# Patient Record
Sex: Male | Born: 1940
Health system: Southern US, Community
[De-identification: ages and names within clinical notes are randomized; demographics above are authoritative.]

## PROBLEM LIST (undated history)

## (undated) DIAGNOSIS — IMO0002 Reserved for concepts with insufficient information to code with codable children: Secondary | ICD-10-CM

## (undated) DIAGNOSIS — I10 Essential (primary) hypertension: Secondary | ICD-10-CM

## (undated) DIAGNOSIS — R739 Hyperglycemia, unspecified: Secondary | ICD-10-CM

## (undated) DIAGNOSIS — A6000 Herpesviral infection of urogenital system, unspecified: Secondary | ICD-10-CM

## (undated) DIAGNOSIS — C679 Malignant neoplasm of bladder, unspecified: Secondary | ICD-10-CM

## (undated) HISTORY — DX: Hyperglycemia, unspecified: R73.9

## (undated) HISTORY — PX: EYE SURGERY: SHX253

## (undated) HISTORY — DX: Reserved for concepts with insufficient information to code with codable children: IMO0002

## (undated) HISTORY — DX: Malignant neoplasm of bladder, unspecified: C67.9

## (undated) HISTORY — PX: ROTATOR CUFF REPAIR: SHX139

## (undated) HISTORY — DX: Herpesviral infection of urogenital system, unspecified: A60.00

## (undated) HISTORY — PX: TONSILLECTOMY: SUR1361

## (undated) HISTORY — PX: PROSTATECTOMY: SHX69

## (undated) HISTORY — DX: Essential (primary) hypertension: I10

---

## 2006-03-25 ENCOUNTER — Other Ambulatory Visit: Payer: Self-pay

## 2006-03-25 ENCOUNTER — Emergency Department: Payer: Self-pay | Admitting: Emergency Medicine

## 2006-03-27 ENCOUNTER — Emergency Department: Payer: Self-pay | Admitting: Emergency Medicine

## 2006-04-12 HISTORY — PX: SQUAMOUS CELL CARCINOMA EXCISION: SHX2433

## 2006-12-05 ENCOUNTER — Other Ambulatory Visit: Payer: Self-pay

## 2006-12-05 ENCOUNTER — Ambulatory Visit: Payer: Self-pay | Admitting: General Surgery

## 2006-12-08 ENCOUNTER — Ambulatory Visit: Payer: Self-pay | Admitting: General Surgery

## 2006-12-08 HISTORY — PX: HERNIA REPAIR: SHX51

## 2008-09-01 ENCOUNTER — Ambulatory Visit: Payer: Self-pay | Admitting: Internal Medicine

## 2009-01-03 DIAGNOSIS — R739 Hyperglycemia, unspecified: Secondary | ICD-10-CM | POA: Insufficient documentation

## 2009-01-03 LAB — PSA: PSA: 0.6

## 2009-05-19 ENCOUNTER — Ambulatory Visit: Payer: Self-pay | Admitting: Family Medicine

## 2009-12-11 ENCOUNTER — Ambulatory Visit: Payer: Self-pay | Admitting: Family Medicine

## 2010-07-21 ENCOUNTER — Ambulatory Visit: Payer: Self-pay | Admitting: Gastroenterology

## 2010-07-21 LAB — HM COLONOSCOPY: HM Colonoscopy: NORMAL

## 2012-07-30 ENCOUNTER — Ambulatory Visit: Payer: Self-pay | Admitting: Family Medicine

## 2012-10-10 HISTORY — PX: CATARACT EXTRACTION: SUR2

## 2012-10-25 ENCOUNTER — Ambulatory Visit: Payer: Self-pay | Admitting: Ophthalmology

## 2014-07-10 ENCOUNTER — Ambulatory Visit
Admit: 2014-07-10 | Disposition: A | Discharge status: Home / Self Care | Payer: Self-pay | Attending: Family Medicine | Admitting: Family Medicine

## 2014-07-13 ENCOUNTER — Ambulatory Visit
Admit: 2014-07-13 | Disposition: A | Discharge status: Home / Self Care | Payer: Self-pay | Attending: Internal Medicine | Admitting: Internal Medicine

## 2014-07-17 ENCOUNTER — Ambulatory Visit
Admit: 2014-07-17 | Disposition: A | Discharge status: Home / Self Care | Payer: Self-pay | Attending: Family Medicine | Admitting: Family Medicine

## 2014-07-17 LAB — CBC AND DIFFERENTIAL
HEMATOCRIT: 50 % (ref 41–53)
HEMOGLOBIN: 17.2 g/dL (ref 13.5–17.5)
Platelets: 581 10*3/uL — AB (ref 150–399)
WBC: 19 10^3/mL

## 2014-07-26 ENCOUNTER — Ambulatory Visit
Admit: 2014-07-26 | Disposition: A | Discharge status: Home / Self Care | Payer: Self-pay | Attending: Family Medicine | Admitting: Family Medicine

## 2014-07-30 ENCOUNTER — Ambulatory Visit
Admit: 2014-07-30 | Disposition: A | Discharge status: Home / Self Care | Payer: Self-pay | Attending: Family Medicine | Admitting: Family Medicine

## 2014-09-26 DIAGNOSIS — M5416 Radiculopathy, lumbar region: Secondary | ICD-10-CM | POA: Diagnosis not present

## 2014-12-03 ENCOUNTER — Other Ambulatory Visit: Payer: Self-pay | Admitting: Family Medicine

## 2014-12-10 DIAGNOSIS — R252 Cramp and spasm: Secondary | ICD-10-CM | POA: Insufficient documentation

## 2014-12-10 DIAGNOSIS — M79606 Pain in leg, unspecified: Secondary | ICD-10-CM | POA: Insufficient documentation

## 2014-12-10 DIAGNOSIS — M5410 Radiculopathy, site unspecified: Secondary | ICD-10-CM | POA: Insufficient documentation

## 2014-12-10 DIAGNOSIS — A6000 Herpesviral infection of urogenital system, unspecified: Secondary | ICD-10-CM | POA: Insufficient documentation

## 2014-12-10 DIAGNOSIS — I10 Essential (primary) hypertension: Secondary | ICD-10-CM | POA: Insufficient documentation

## 2014-12-10 DIAGNOSIS — N529 Male erectile dysfunction, unspecified: Secondary | ICD-10-CM | POA: Insufficient documentation

## 2014-12-10 DIAGNOSIS — IMO0002 Reserved for concepts with insufficient information to code with codable children: Secondary | ICD-10-CM | POA: Insufficient documentation

## 2014-12-10 DIAGNOSIS — M792 Neuralgia and neuritis, unspecified: Secondary | ICD-10-CM | POA: Insufficient documentation

## 2014-12-10 DIAGNOSIS — G589 Mononeuropathy, unspecified: Secondary | ICD-10-CM | POA: Insufficient documentation

## 2014-12-10 DIAGNOSIS — R42 Dizziness and giddiness: Secondary | ICD-10-CM | POA: Insufficient documentation

## 2014-12-12 ENCOUNTER — Ambulatory Visit (INDEPENDENT_AMBULATORY_CARE_PROVIDER_SITE_OTHER): Payer: Medicare PPO | Admitting: Family Medicine

## 2014-12-12 ENCOUNTER — Encounter: Payer: Self-pay | Admitting: Family Medicine

## 2014-12-12 VITALS — BP 122/70 | HR 82 | Temp 98.3°F | Resp 16 | Ht 70.75 in | Wt 188.0 lb

## 2014-12-12 DIAGNOSIS — Z23 Encounter for immunization: Secondary | ICD-10-CM

## 2014-12-12 DIAGNOSIS — Z0000 Encounter for general adult medical examination without abnormal findings: Secondary | ICD-10-CM

## 2014-12-12 NOTE — Progress Notes (Signed)
Patient: Jerry Mcpherson, Male    DOB: 1940-06-27, 74 y.o.   MRN: [id number] Visit Date: 12/12/2014  Today's Provider: Lelon Huh, MD   Chief Complaint  Patient presents with  . Medicare Wellness  . Hypertension  . Hyperglycemia   Subjective:    Annual physical  Jerry Mcpherson is a 74 y.o. male. He feels well. He reports exercising  never. He reports he is sleeping well.  -----------------------------------------------------------  Hypertension, follow-up:  BP Readings from Last 3 Encounters:  12/12/14 122/70  07/17/14 152/76    He was last seen for hypertension 2 years ago.  BP at that visit was  124/960. Management changes since that visit include  none. He reports good compliance with treatment. He is not having side effects.  He is not exercising. He is  Not adherent to low salt diet.   Outside blood pressures are not being checked. He is experiencing none.  Patient denies chest pain, chest pressure/discomfort, claudication, dyspnea, exertional chest pressure/discomfort, fatigue, irregular heart beat and lower extremity edema.   Cardiovascular risk factors include advanced age (older than 33 for men, 79 for women), hypertension and male gender.  Use of agents associated with hypertension: NSAIDS.     Weight trend: decreasing steadily Wt Readings from Last 3 Encounters:  12/12/14 188 lb (85.276 kg)  07/17/14 190 lb (86.183 kg)    Current diet: in general, an "unhealthy" diet  ------------------------------------------------------------------------   Prediabetes, Follow-up:   No results found for: HGBA1C, GLUCOSE  Last seen for for this2 years ago.  Management changes included none. Current symptoms include none and have been stable.  Weight trend: decreasing steadily Prior visit with dietician: no Current diet: in general, an "unhealthy" diet Current exercise: none  Pertinent Labs: No results found for: CHOL, TRIG, CHOLHDL,  CREATININE  Wt Readings from Last 3 Encounters:  12/12/14 188 lb (85.276 kg)  07/17/14 190 lb (86.183 kg)     Review of Systems  Constitutional: Negative for fever, chills, appetite change and fatigue.  HENT: Negative for congestion, ear pain, hearing loss, nosebleeds and trouble swallowing.   Eyes: Negative for pain and visual disturbance.  Respiratory: Negative for cough, chest tightness and shortness of breath.   Cardiovascular: Negative for chest pain, palpitations and leg swelling.  Gastrointestinal: Negative for nausea, vomiting, abdominal pain, diarrhea, constipation and blood in stool.  Endocrine: Negative for polydipsia, polyphagia and polyuria.  Genitourinary: Negative for dysuria and flank pain.  Musculoskeletal: Negative for myalgias, back pain, joint swelling, arthralgias and neck stiffness.  Skin: Negative for color change, rash and wound.  Neurological: Negative for dizziness, tremors, seizures, speech difficulty, weakness, light-headedness and headaches.  Psychiatric/Behavioral: Negative for behavioral problems, confusion, sleep disturbance, dysphoric mood and decreased concentration. The patient is not nervous/anxious.   All other systems reviewed and are negative.   Social History   Social History  . Marital Status: Divorced    Spouse Name: N/A  . Number of Children: 1  . Years of Education: N/A   Occupational History  . Retired    Social History Main Topics  . Smoking status: Former Smoker -- 3.00 packs/day for 15 years    Types: Cigarettes    Quit date: 04/12/1968  . Smokeless tobacco: Not on file  . Alcohol Use: No  . Drug Use: No  . Sexual Activity: Not on file   Other Topics Concern  . Not on file   Social History Narrative    Patient Active  Problem List   Diagnosis Date Noted  . Erectile dysfunction 12/10/2014  . Genital HSV 12/10/2014  . Hypertension 12/10/2014  . Leg cramps 12/10/2014  . Leg pain 12/10/2014  . Neuralgia 12/10/2014  .  Pinched nerve 12/10/2014  . Radiculopathy 12/10/2014  . Squamous cell carcinoma 12/10/2014  . Head revolving around 12/10/2014  . Hyperglycemia 01/03/2009    Past Surgical History  Procedure Laterality Date  . Cataract extraction Left 10/2012    Dr. Murvin Natal; [place] [facility]  . Hernia repair  89/21/1941    umbilical hernia, incarderated; Dr. Bary Castilla  . Squamous cell carcinoma excision  2008  . Eye surgery Left     removed a piece of steel  . Rotator cuff repair    . Tonsillectomy      His family history includes COPD in his sister; Cancer in his father; Congestive Heart Failure in his brother; Heart attack in his mother; Hypertension in his mother; Stroke in his father.    Previous Medications   AMLODIPINE (NORVASC) 5 MG TABLET    Take 1 tablet by mouth daily.   ASCORBIC ACID (VITAMIN C) 1000 MG TABLET    Take 2 tablets by mouth daily.   ASPIRIN 81 MG TABLET    Take 1 tablet by mouth daily.   DOCUSATE SODIUM (COLACE) 100 MG CAPSULE    Take 1 capsule by mouth daily as needed.   MULTIPLE VITAMINS-MINERALS (MULTIVITAMIN ADULT PO)    Take 1 tablet by mouth daily.   OXYCODONE HCL, ABUSE DETER, (OXAYDO) 5 MG TABA    Take 0.5 tablets by mouth at bedtime as needed. For foot pain   SILDENAFIL (VIAGRA) 50 MG TABLET    Take 0.5-1 tablets by mouth daily as needed.   VALACYCLOVIR (VALTREX) 500 MG TABLET    TAKE 1 TABLET BY MOUTH TWICE DAILY FOR 3 DAYS AS NEEDED    Patient Care Team: Birdie Sons, MD as PCP - General (Family Medicine)     Objective:   Vitals: BP 122/70 mmHg  Pulse 82  Temp(Src) 98.3 F (36.8 C) (Oral)  Resp 16  Ht 5' 10.75" (1.797 m)  Wt 188 lb (85.276 kg)  BMI 26.41 kg/m2  SpO2 96%  Physical Exam  General Appearance:    Alert, cooperative, no distress, appears stated age  Head:    Normocephalic, without obvious abnormality, atraumatic  Eyes:    PERRL, conjunctiva/corneas clear, EOM's intact, fundi    benign, both eyes       Ears:    Normal TM's and  external ear canals, both ears  Nose:   Nares normal, septum midline, mucosa normal, no drainage   or sinus tenderness  Throat:   Lips, mucosa, and tongue normal; teeth and gums normal  Neck:   Supple, symmetrical, trachea midline, no adenopathy;       thyroid:  No enlargement/tenderness/nodules; no carotid   bruit or JVD  Back:     Symmetric, no curvature, ROM normal, no CVA tenderness  Lungs:     Clear to auscultation bilaterally, respirations unlabored  Chest wall:    No tenderness or deformity  Heart:    Regular rate and rhythm, S1 and S2 normal, no murmur, rub   or gallop  Abdomen:     Soft, non-tender, bowel sounds active all four quadrants,    no masses, no organomegaly  Genitalia:    deferred  Rectal:    deferred  Extremities:   Extremities normal, atraumatic, no cyanosis or edema  Pulses:   2+ and symmetric all extremities  Skin:   Skin color, texture, turgor normal, no rashes or lesions  Lymph nodes:   Cervical, supraclavicular, and axillary nodes normal  Neurologic:   CNII-XII intact. Normal strength, sensation and reflexes      throughout    Activities of Daily Living In your present state of health, do you have any difficulty performing the following activities: 12/12/2014  Hearing? Y  Vision? N  Difficulty concentrating or making decisions? Y  Walking or climbing stairs? Y  Dressing or bathing? N  Doing errands, shopping? N    Fall Risk Assessment Fall Risk  12/12/2014  Falls in the past year? No     Depression Screen PHQ 2/9 Scores 12/12/2014  PHQ - 2 Score 0    Cognitive Testing - 6-CIT  Correct? Score   What year is it? yes 0 0 or 4  What month is it? yes 0 0 or 3  Memorize:    Pia Mau,  40,  [place],      What time is it? (within 1 hour) yes 0 0 or 3  Count backwards from 20 yes 0 0, 2, or 4  Name the months of the year yes 0 0, 2, or 4  Repeat name & address above no 8 0, 2, 4, 6, 8, or 10       TOTAL SCORE  8/28   Interpretation:   Abnormal- .  Normal (0-7) Abnormal (8-28)     Audit-C Alcohol Use Screening  Question Answer Points  How often do you have alcoholic drink? never 0  How many drinks do you typically consume in a day? 0 0  How oftey will you drink 6 or more in a total? never 0  Total Score:  0   A score of 3 or more in women, and 4 or more in men indicates increased risk for alcohol abuse, EXCEPT if all of the points are from question 1.   Assessment & Plan:     Annual Wellness Visit  Reviewed patient's Family Medical History Reviewed and updated list of patient's medical providers Assessment of cognitive impairment was done Assessed patient's functional ability Established a written schedule for health screening [place] Completed and Reviewed  Exercise Activities and Dietary recommendations Goals    None      Immunization History  Administered Date(s) Administered  . Pneumococcal Conjugate-13 12/31/2013  . Pneumococcal Polysaccharide-23 02/22/2004  . Tdap 10/28/2011  . Zoster 01/28/2009    Health Maintenance  Topic Date Due  . INFLUENZA VACCINE  11/11/2014  . COLONOSCOPY  07/20/2020  . TETANUS/TDAP  10/27/2021  . ZOSTAVAX  Completed  . PNA vac Low Risk Adult  Completed      Discussed health benefits of physical activity, and encouraged him to engage in regular exercise appropriate for his age and condition.    ------------------------------------------------------------------------------------------------------------ 1. Annual physical exam  - Basic metabolic panel - PSA  2. Need for influenza vaccination  - Flu vaccine HIGH DOSE PF

## 2014-12-13 LAB — PSA: PROSTATE SPECIFIC AG, SERUM: 0.5 ng/mL (ref 0.0–4.0)

## 2014-12-13 LAB — BASIC METABOLIC PANEL
BUN/Creatinine Ratio: 19 (ref 10–22)
BUN: 18 mg/dL (ref 8–27)
CO2: 22 mmol/L (ref 18–29)
CREATININE: 0.94 mg/dL (ref 0.76–1.27)
Calcium: 9.1 mg/dL (ref 8.6–10.2)
Chloride: 103 mmol/L (ref 97–108)
GFR calc non Af Amer: 78 mL/min/{1.73_m2} (ref >59)
GFR, EST AFRICAN AMERICAN: 91 mL/min/{1.73_m2} (ref >59)
Glucose: 108 mg/dL — ABNORMAL HIGH (ref 65–99)
Potassium: 4.3 mmol/L (ref 3.5–5.2)
Sodium: 142 mmol/L (ref 134–144)

## 2014-12-21 ENCOUNTER — Ambulatory Visit
Admission: EM | Admit: 2014-12-21 | Discharge: 2014-12-21 | Disposition: A | Discharge status: Home / Self Care | Payer: Medicare PPO | Attending: Family Medicine | Admitting: Family Medicine

## 2014-12-21 DIAGNOSIS — T148XXA Other injury of unspecified body region, initial encounter: Secondary | ICD-10-CM

## 2014-12-21 DIAGNOSIS — R030 Elevated blood-pressure reading, without diagnosis of hypertension: Secondary | ICD-10-CM | POA: Diagnosis not present

## 2014-12-21 DIAGNOSIS — T148 Other injury of unspecified body region: Secondary | ICD-10-CM

## 2014-12-21 DIAGNOSIS — IMO0001 Reserved for inherently not codable concepts without codable children: Secondary | ICD-10-CM

## 2014-12-21 DIAGNOSIS — S4992XA Unspecified injury of left shoulder and upper arm, initial encounter: Secondary | ICD-10-CM

## 2014-12-21 MED ORDER — ACETAMINOPHEN 500 MG PO TABS: 1000.0000 mg | ORAL_TABLET | Freq: Four times a day (QID) | ORAL | Status: DC | PRN

## 2014-12-21 NOTE — ED Notes (Signed)
yesteday stepped on a piece of plywood and slipped out and left shoulder hit the wall stud .limited motion and pain

## 2014-12-21 NOTE — ED Provider Notes (Signed)
CSN: [id number]     Arrival date & time 12/21/14  4132 History   First MD Initiated Contact with Patient 12/21/14 1029     Chief Complaint  Patient presents with  . Shoulder Pain   (Consider location/radiation/quality/duration/timing/severity/associated sxs/prior Treatment) HPI Comments: Single caucasian male was working on home project yesterday and tripped on piece of plywood and lost balance into wall studs wood with left shoulder.  Immediately applied ice, rested, took 2 aleve and tylenol last night and 1 aleve this am with some relief of symptoms.  Difficulty getting dressed this am with shirts and suspenders. C/o deep pain left shoulder, biceps and shoulder muscle pain with raising left arm or trying to carry anything with left hand e.g. Water gallon.  9/10 pain yesterday or with trying to put on suspenders today.  At rest in exam room 0/10.     Per patient swelling has decreased a great deal since yesterday.  Denied shoulder/back/arm bruises but right shin with scratch, bruise and swelling.  Patient has oxycodone at home from previous right shoulder rotator cuff surgery but doesn't like to use it makes him feel funny.  Patient is a 74 y.o. male presenting with shoulder pain. The history is provided by the patient.  Shoulder Pain Location:  Shoulder Time since incident:  2 days Injury: yes   Mechanism of injury: fall   Fall:    Fall occurred:  Tripped and walking   Impact surface:  Hard floor   Point of impact: left shoulder into wall studs.   Entrapped after fall: no   Shoulder location:  L shoulder Pain details:    Quality:  Aching and shooting   Radiates to:  Does not radiate   Severity:  Severe   Onset quality:  Sudden   Duration:  2 days   Timing:  Intermittent   Progression:  Waxing and waning Chronicity:  New Handedness:  Right-handed Dislocation: no   Foreign body present:  No foreign bodies Prior injury to area:  No Relieved by:  Being still, ice, NSAIDs, rest and  acetaminophen Worsened by:  Exercise, movement and stretching area Ineffective treatments:  Ice, being still, NSAIDs, rest and acetaminophen Associated symptoms: decreased range of motion and swelling   Associated symptoms: no back pain, no fatigue, no fever, no muscle weakness, no neck pain, no numbness, no stiffness and no tingling   Risk factors: no concern for non-accidental trauma, no known bone disorder, no frequent fractures and no recent illness     Past Medical History  Diagnosis Date  . Genital herpes   . Squamous cell carcinoma   . Hypertension   . Hyperglycemia    Past Surgical History  Procedure Laterality Date  . Cataract extraction Left 10/2012    Dr. Murvin Natal; [place] [facility]  . Hernia repair  44/04/270    umbilical hernia, incarderated; Dr. Bary Castilla  . Squamous cell carcinoma excision  2008  . Eye surgery Left     removed a piece of steel  . Rotator cuff repair    . Tonsillectomy     Family History  Problem Relation Age of Onset  . Hypertension Mother   . Heart attack Mother   . Stroke Father   . Cancer Father   . COPD Sister   . Congestive Heart Failure Brother    Social History  Substance Use Topics  . Smoking status: Former Smoker -- 3.00 packs/day for 15 years    Types: Cigarettes    Quit date:  04/12/1968  . Smokeless tobacco: None  . Alcohol Use: No    Review of Systems  Constitutional: Negative for fever, chills, diaphoresis, activity change, appetite change and fatigue.  HENT: Negative for congestion, dental problem, drooling, ear discharge, ear pain, facial swelling, mouth sores, nosebleeds, tinnitus, trouble swallowing and voice change.   Eyes: Negative for photophobia, pain, discharge, redness, itching and visual disturbance.  Respiratory: Negative for cough, shortness of breath, wheezing and stridor.   Cardiovascular: Negative for chest pain, palpitations and leg swelling.  Gastrointestinal: Negative for nausea, vomiting, abdominal  pain, diarrhea, constipation, blood in stool, abdominal distention, anal bleeding and rectal pain.  Endocrine: Negative for cold intolerance and heat intolerance.  Genitourinary: Negative for dysuria.  Musculoskeletal: Positive for myalgias and joint swelling. Negative for back pain, arthralgias, gait problem, stiffness, neck pain and neck stiffness.  Skin: Positive for color change and wound. Negative for pallor and rash.  Allergic/Immunologic: Negative for environmental allergies and food allergies.  Neurological: Negative for dizziness, tremors, seizures, syncope, facial asymmetry, speech difficulty, weakness, light-headedness, numbness and headaches.  Psychiatric/Behavioral: Negative for behavioral problems, confusion, sleep disturbance and agitation.    Allergies  Codeine; Cozaar; Cyclobenzaprine; Ultram ; and Celebrex   Home Medications   Prior to Admission medications   Medication Sig Start Date End Date Taking? Authorizing Provider  acetaminophen (TYLENOL) 500 MG tablet Take 2 tablets (1,000 mg total) by mouth every 6 (six) hours as needed for mild pain or moderate pain. 12/21/14   Olen Cordial, NP  amLODipine (NORVASC) 5 MG tablet Take 1 tablet by mouth daily. 01/12/14   Historical Provider, MD  Ascorbic Acid (VITAMIN C) 1000 MG tablet Take 2 tablets by mouth daily.    Historical Provider, MD  aspirin 81 MG tablet Take 1 tablet by mouth daily.    Historical Provider, MD  docusate sodium (COLACE) 100 MG capsule Take 1 capsule by mouth daily as needed.    Historical Provider, MD  Multiple Vitamins-Minerals (MULTIVITAMIN ADULT PO) Take 1 tablet by mouth daily.    Historical Provider, MD  sildenafil (VIAGRA) 50 MG tablet Take 0.5-1 tablets by mouth daily as needed. 11/22/12   Historical Provider, MD  valACYclovir (VALTREX) 500 MG tablet TAKE 1 TABLET BY MOUTH TWICE DAILY FOR 3 DAYS AS NEEDED 12/04/14   Birdie Sons, MD   Meds Ordered and Administered this Visit  Medications - No  data to display  BP 166/94 mmHg  Pulse 82  Temp(Src) 98.3 F (36.8 C) (Oral)  Resp 16  Ht 5\' 11"  (1.803 m)  Wt 190 lb (86.183 kg)  BMI 26.51 kg/m2  SpO2 97% No data found.   Physical Exam  Constitutional: He is oriented to person, place, and time. Vital signs are normal. He appears well-developed and well-nourished. No distress.  HENT:  Head: Normocephalic and atraumatic.  Right Ear: External ear normal.  Left Ear: External ear normal.  Nose: Nose normal.  Mouth/Throat: Oropharynx is clear and moist. No oropharyngeal exudate.  Eyes: Conjunctivae, EOM and lids are normal. Pupils are equal, round, and reactive to light. Right eye exhibits no discharge. Left eye exhibits no discharge. No scleral icterus.  Neck: Trachea normal and normal range of motion. Neck supple. No tracheal deviation present.  Cardiovascular: Normal rate, regular rhythm and intact distal pulses.   Pulmonary/Chest: Effort normal and breath sounds normal. No stridor. No respiratory distress. He has no wheezes. He has no rales.  Abdominal: Soft. He exhibits no distension.  Musculoskeletal: He exhibits edema  and tenderness.       Right shoulder: He exhibits tenderness. He exhibits normal range of motion, no bony tenderness, no swelling, no effusion, no crepitus, no deformity, no laceration, no pain, no spasm, normal pulse and normal strength.       Left shoulder: He exhibits decreased range of motion, tenderness, swelling and pain. He exhibits no bony tenderness, no effusion, no crepitus, no deformity, no laceration, no spasm, normal pulse and normal strength.       Right elbow: Normal.      Left elbow: Normal.       Right wrist: Normal.       Left wrist: Normal.       Right hip: Normal.       Left hip: Normal.       Right knee: Normal.       Left knee: Normal.       Right ankle: Normal.       Left ankle: Normal.       Cervical back: Normal.       Thoracic back: Normal.       Lumbar back: Normal.       Right  upper arm: He exhibits tenderness. He exhibits no bony tenderness, no swelling, no edema, no deformity and no laceration.       Left upper arm: He exhibits tenderness and swelling. He exhibits no bony tenderness, no edema, no deformity and no laceration.       Right forearm: Normal.       Left forearm: Normal.       Right hand: Normal.       Left hand: Normal.       Right lower leg: He exhibits tenderness, swelling and edema. He exhibits no bony tenderness, no deformity and no laceration.       Left lower leg: Normal.  Right proximal bicep defect (old) slightly TTP; full AROM with minimal discomfort; left proximal biceps TTP, deltoid slightly TTP no ecchymosis or abrasion noted; external shoulder rotation, crossbody reach, atchley scratch, empty can and abduction/adduction pain that starts with 15 degrees elevation; 0-1+/4 nonpitting edema upper left arm/deltoid  Neurological: He is alert and oriented to person, place, and time. He displays no atrophy and no tremor. No cranial nerve deficit or sensory deficit. He exhibits normal muscle tone. He displays no seizure activity. Coordination and gait normal. GCS eye subscore is 4. GCS verbal subscore is 5. GCS motor subscore is 6.  Skin: Skin is warm and dry. Abrasion, bruising and ecchymosis noted. No burn, no laceration, no lesion, no petechiae, no purpura and no rash noted. Rash is not macular, not papular, not maculopapular, not nodular, not pustular, not vesicular and not urticarial. He is not diaphoretic. No cyanosis or erythema. No pallor. Nails show no clubbing.     Psychiatric: He has a normal mood and affect. His speech is normal and behavior is normal. Judgment and thought content normal. Cognition and memory are normal.  Nursing note and vitals reviewed.   ED Course  Procedures (including critical care time)  Labs Review Labs Reviewed - No data to display  Imaging Review No results found.   MDM   1. Shoulder injury, left, initial  encounter   2. Contusion   3. Abrasion   4. Elevated blood pressure    Patient was instructed to rest, ice, and ROM exercises.  Tylenol 1000mg  po QID prn pain.  Avoid aleve unless tylenol and ice with rest not providing enough relief  than use for breakthrough pain with caution as counteracts your blood pressure medication.  Patient has taken aleve without side effects OTC in the past week.  Exitcare handout on shoulder injury, shoulder exercises.  Demonstrated shoulder exercises with patient and he is familiar from post surgery PT right shoulder. Discussed xray and patient refused at this time.   Activity as tolerated.  Patient is to take OTC po NSAIDS as needed.  Follow up if symptoms persist or worsen with [facility] consider orthopedic evaluation or imaging MRI if worsening/no improvement over the next 2 weeks.  Patient verbalized agreement and understanding of treatment plan and had no further questions at this time.  P2:  Injury Prevention and Fitness.  Patient was instructed to rest extremities.  Ice and elevate prn.  Tylenol 1000mg  po QID prn pain.  Do not soak leg left until abrasion healed avoid pool, lake, hot tub, dirty sink water.  May shower apply neosporin OTC BID keep wounds covered they will heal faster and prevent contamination rubbing from clothing tearing off scabs.  Exitcare handout on contusion, abrasion given to patient.   Medications as directed.  Call or return to clinic as needed if these symptoms worsen or fail to improve as anticipated e.g. Redness, discharge purulent, fever.  Patient verbalized agreement and understanding of treatment plan.  P2:  ROM, injury prevention  Discussed with patient tylenol preferred pain medication as does not counteract his blood pressure medication.  Pressure elevated due to shoulder injury pain and medications today.  Patient took his norvasc this am.    Only use aleve for breakthrough pain after tylenol and ice/rest/elevation.  Continue current  medications as directed.  Continue to monitor blood pressure at home and maintain log of blood pressure and pulse to bring to follow up appointments.  Continue low sodium diet and exercise program.  Recommended weight loss/weight maintenance to BMI 20-25.  Return to the clinic if any new symptoms.  Patient verbalized agreement and understanding of treatment plan and had no further questions at this time.   P2:  Diet and Exercise specific for HTN  Olen Cordial, NP 12/21/14 1107

## 2014-12-21 NOTE — Discharge Instructions (Signed)
Abrasion An abrasion is a cut or scrape of the skin. Abrasions do not extend through all layers of the skin and most heal within 10 days. It is important to care for your abrasion properly to prevent infection. CAUSES  Most abrasions are caused by falling on, or gliding across, the ground or other surface. When your skin rubs on something, the outer and inner layer of skin rubs off, causing an abrasion. DIAGNOSIS  Your caregiver will be able to diagnose an abrasion during a physical exam.  TREATMENT  Your treatment depends on how large and deep the abrasion is. Generally, your abrasion will be cleaned with water and a mild soap to remove any dirt or debris. An antibiotic ointment may be put over the abrasion to prevent an infection. A bandage (dressing) may be wrapped around the abrasion to keep it from getting dirty.  You may need a tetanus shot if:  You cannot remember when you had your last tetanus shot.  You have never had a tetanus shot.  The injury broke your skin. If you get a tetanus shot, your arm may swell, get red, and feel warm to the touch. This is common and not a problem. If you need a tetanus shot and you choose not to have one, there is a rare chance of getting tetanus. Sickness from tetanus can be serious.  HOME CARE INSTRUCTIONS   If a dressing was applied, change it at least once a day or as directed by your caregiver. If the bandage sticks, soak it off with warm water.   Wash the area with water and a mild soap to remove all the ointment 2 times a day. Rinse off the soap and pat the area dry with a clean towel.   Reapply any ointment as directed by your caregiver. This will help prevent infection and keep the bandage from sticking. Use gauze over the wound and under the dressing to help keep the bandage from sticking.   Change your dressing right away if it becomes wet or dirty.   Only take over-the-counter or prescription medicines for pain, discomfort, or fever as  directed by your caregiver.   Follow up with your caregiver within 24-48 hours for a wound check, or as directed. If you were not given a wound-check appointment, look closely at your abrasion for redness, swelling, or pus. These are signs of infection. SEEK IMMEDIATE MEDICAL CARE IF:   You have increasing pain in the wound.   You have redness, swelling, or tenderness around the wound.   You have pus coming from the wound.   You have a fever or persistent symptoms for more than 2-3 days.  You have a fever and your symptoms suddenly get worse.  You have a bad smell coming from the wound or dressing.  MAKE SURE YOU:   Understand these instructions.  Will watch your condition.  Will get help right away if you are not doing well or get worse. Document Released: 01/06/2005 Document Revised: 03/15/2012 Document Reviewed: 03/02/2011 [facility] Patient Information 2015 [place], [place]. This information is not intended to replace advice given to you by your health care provider. Make sure you discuss any questions you have with your health care provider.  Acromioclavicular Injuries The AC (acromioclavicular) joint is the joint in the shoulder where the collarbone (clavicle) meets the shoulder blade (scapula). The part of the shoulder blade connected to the collarbone is called the acromion. Common problems with and treatments for the [facility] joint are  detailed below. ARTHRITIS Arthritis occurs when the joint has been injured and the smooth padding between the joints (cartilage) is lost. This is the wear and tear seen in most joints of the body if they have been overused. This causes the joint to produce pain and swelling which is worse with activity.  AC JOINT SEPARATION AC joint separation means that the ligaments connecting the acromion of the shoulder blade and collarbone have been damaged, and the two bones no longer line up. AC separations can be anywhere from mild to severe, and are  "graded" depending upon which ligaments are torn and how badly they are torn.  Grade I Injury: the least damage is done, and the [facility] joint still lines up.  Grade II Injury: damage to the ligaments which reinforce the [facility] joint. In a Grade II injury, these ligaments are stretched but not entirely torn. When stressed, the [facility] joint becomes painful and unstable.  Grade III Injury: AC and secondary ligaments are completely torn, and the collarbone is no longer attached to the shoulder blade. This results in deformity; a prominence of the end of the clavicle. AC JOINT FRACTURE AC joint fracture means that there has been a break in the bones of the [facility] joint, usually the end of the clavicle. TREATMENT TREATMENT OF AC ARTHRITIS  There is currently no way to replace the cartilage damaged by arthritis. The best way to improve the condition is to decrease the activities which aggravate the problem. Application of ice to the joint helps decrease pain and soreness (inflammation). The use of non-steroidal anti-inflammatory medication is helpful.  If less conservative measures do not work, then cortisone shots (injections) may be used. These are anti-inflammatories; they decrease the soreness in the joint and swelling.  If non-surgical measures fail, surgery may be recommended. The procedure is generally removal of a portion of the end of the clavicle. This is the part of the collarbone closest to your acromion which is stabilized with ligaments to the acromion of the shoulder blade. This surgery may be performed using a tube-like instrument with a light (arthroscope) for looking into a joint. It may also be performed as an open surgery through a small incision by the surgeon. Most patients will have good range of motion within 6 weeks and may return to all activity including sports by 8-12 weeks, barring complications. TREATMENT OF AN AC SEPARATION  The initial treatment is to decrease pain. This is best  accomplished by immobilizing the arm in a sling and placing an ice pack to the shoulder for 20 to 30 minutes every 2 hours as needed. As the pain starts to subside, it is important to begin moving the fingers, wrist, elbow and eventually the shoulder in order to prevent a stiff or "frozen" shoulder. Instruction on when and how much to move the shoulder will be provided by your caregiver. The length of time needed to regain full motion and function depends on the amount or grade of the injury. Recovery from a Grade I AC separation usually takes 10 to 14 days, whereas a Grade III may take 6 to 8 weeks.  Grade I and II separations usually do not require surgery. Even Grade III injuries usually allow return to full activity with few restrictions. Treatment is also based on the activity demands of the injured shoulder. For example, a high level quarterback with an injured throwing arm will receive more aggressive treatment than someone with a desk job who rarely uses his/her arm for  strenuous activities. In some cases, a painful lump may persist which could require a later surgery. Surgery can be very successful, but the benefits must be weighed against the potential risks. TREATMENT OF AN AC JOINT FRACTURE Fracture treatment depends on the type of fracture. Sometimes a splint or sling may be all that is required. Other times surgery may be required for repair. This is more frequently the case when the ligaments supporting the clavicle are completely torn. Your caregiver will help you with these decisions and together you can decide what will be the best treatment. HOME CARE INSTRUCTIONS   Apply ice to the injury for 15-20 minutes each hour while awake for 2 days. Put the ice in a plastic bag and place a towel between the bag of ice and skin.  If a sling has been applied, wear it constantly for as long as directed by your caregiver, even at night. The sling or splint can be removed for bathing or showering or as  directed. Be sure to keep the shoulder in the same place as when the sling is on. Do not lift the arm.  If a figure-of-eight splint has been applied it should be tightened gently by another person every day. Tighten it enough to keep the shoulders held back. Allow enough room to place the index finger between the body and strap. Loosen the splint immediately if there is numbness or tingling in the hands.  Take over-the-counter or prescription medicines for pain, discomfort or fever as directed by your caregiver.  If you or your child has received a follow up appointment, it is very important to keep that appointment in order to avoid long term complications, chronic pain or disability. SEEK MEDICAL CARE IF:   The pain is not relieved with medications.  There is increased swelling or discoloration that continues to get worse rather than better.  You or your child has been unable to follow up as instructed.  There is progressive numbness and tingling in the arm, forearm or hand. SEEK IMMEDIATE MEDICAL CARE IF:   The arm is numb, cold or pale.  There is increasing pain in the hand, forearm or fingers. MAKE SURE YOU:   Understand these instructions.  Will watch your condition.  Will get help right away if you are not doing well or get worse. Document Released: 01/06/2005 Document Revised: 06/21/2011 Document Reviewed: 07/01/2008 [facility] Patient Information 2015 [place], [place]. This information is not intended to replace advice given to you by your health care provider. Make sure you discuss any questions you have with your health care provider.  Abrasion An abrasion is a cut or scrape of the skin. Abrasions do not extend through all layers of the skin and most heal within 10 days. It is important to care for your abrasion properly to prevent infection. CAUSES  Most abrasions are caused by falling on, or gliding across, the ground or other surface. When your skin rubs on something, the  outer and inner layer of skin rubs off, causing an abrasion. DIAGNOSIS  Your caregiver will be able to diagnose an abrasion during a physical exam.  TREATMENT  Your treatment depends on how large and deep the abrasion is. Generally, your abrasion will be cleaned with water and a mild soap to remove any dirt or debris. An antibiotic ointment may be put over the abrasion to prevent an infection. A bandage (dressing) may be wrapped around the abrasion to keep it from getting dirty.  You may need a  tetanus shot if:  You cannot remember when you had your last tetanus shot.  You have never had a tetanus shot.  The injury broke your skin. If you get a tetanus shot, your arm may swell, get red, and feel warm to the touch. This is common and not a problem. If you need a tetanus shot and you choose not to have one, there is a rare chance of getting tetanus. Sickness from tetanus can be serious.  HOME CARE INSTRUCTIONS   If a dressing was applied, change it at least once a day or as directed by your caregiver. If the bandage sticks, soak it off with warm water.   Wash the area with water and a mild soap to remove all the ointment 2 times a day. Rinse off the soap and pat the area dry with a clean towel.   Reapply any ointment as directed by your caregiver. This will help prevent infection and keep the bandage from sticking. Use gauze over the wound and under the dressing to help keep the bandage from sticking.   Change your dressing right away if it becomes wet or dirty.   Only take over-the-counter or prescription medicines for pain, discomfort, or fever as directed by your caregiver.   Follow up with your caregiver within 24-48 hours for a wound check, or as directed. If you were not given a wound-check appointment, look closely at your abrasion for redness, swelling, or pus. These are signs of infection. SEEK IMMEDIATE MEDICAL CARE IF:   You have increasing pain in the wound.   You have  redness, swelling, or tenderness around the wound.   You have pus coming from the wound.   You have a fever or persistent symptoms for more than 2-3 days.  You have a fever and your symptoms suddenly get worse.  You have a bad smell coming from the wound or dressing.  MAKE SURE YOU:   Understand these instructions.  Will watch your condition.  Will get help right away if you are not doing well or get worse. Document Released: 01/06/2005 Document Revised: 03/15/2012 Document Reviewed: 03/02/2011 [facility] Patient Information 2015 [place], [place]. This information is not intended to replace advice given to you by your health care provider. Make sure you discuss any questions you have with your health care provider. Shoulder Range of Motion Exercises The shoulder is the most flexible joint in the human body. Because of this it is also the most unstable joint in the body. All ages can develop shoulder problems. Early treatment of problems is necessary for a good outcome. People react to shoulder pain by decreasing the movement of the joint. After a brief period of time, the shoulder can become "frozen". This is an almost complete loss of the ability to move the damaged shoulder. Following injuries your caregivers can give you instructions on exercises to keep your range of motion (ability to move your shoulder freely), or regain it if it has been lost.  [place]: Codman's Exercise or Pendulum Exercise  This exercise may be performed in a prone (face-down) lying position or standing while leaning on a chair with the opposite arm. Its purpose is to relax the muscles in your shoulder and slowly but surely increase the range of motion and to relieve pain.  Lie on your stomach close to the side edge of the bed. Let your weak arm hang over the edge of the bed. Relax your shoulder,  arm and hand. Let your shoulder blade relax and drop  down.  Slowly and gently swing your arm forward and back. Do not use your neck muscles; relax them. It might be easier to have someone else gently start swinging your arm.  As pain decreases, increase your swing. To start, arm swing should begin at 15 degree angles. In time and as pain lessens, move to 30-45 degree angles. Start with swinging for about 15 seconds, and work towards swinging for 3 to 5 minutes.  This exercise may also be performed in a standing/bent over position.  Stand and hold onto a sturdy chair with your good arm. Bend forward at the waist and bend your knees slightly to help protect your back. Relax your weak arm, let it hang limp. Relax your shoulder blade and let it drop.  Keep your shoulder relaxed and use body motion to swing your arm in small circles.  Stand up tall and relax.  Repeat motion and change direction of circles.  Start with swinging for about 30 seconds, and work towards swinging for 3 to 5 minutes. STRETCHING EXERCISES:  Lift your arm out in front of you with the elbow bent at 90 degrees. Using your other arm gently pull the elbow forward and across your body.  Bend one arm behind you with the palm facing outward. Using the other arm, hold a towel or rope and reach this arm up above your head, then bend it at the elbow to move your wrist to behind your neck. Grab the free end of the towel with the hand behind your back. Gently pull the towel up with the hand behind your neck, gradually increasing the pull on the hand behind the small of your back. Then, gradually pull down with the hand behind the small of your back. This will pull the hand and arm behind your neck further. Both shoulders will have an increased range of motion with repetition of this exercise. STRENGTHENING EXERCISES:  Standing with your arm at your side and straight out from your shoulder with the elbow bent at 90 degrees, hold onto a small weight and slowly raise your hand so it points  straight up in the air. Repeat this five times to begin with, and gradually increase to ten times. Do this four times per day. As you grow stronger you can gradually increase the weight.  Repeat the above exercise, only this time using an elastic band. Start with your hand up in the air and pull down until your hand is by your side. As you grow stronger, gradually increase the amount you pull by increasing the number or size of the elastic bands. Use the same amount of repetitions.  Standing with your hand at your side and holding onto a weight, gradually lift the hand in front of you until it is over your head. Do the same also with the hand remaining at your side and lift the hand away from your body until it is again over your head. Repeat this five times to begin with, and gradually increase to ten times. Do this four times per day. As you grow stronger you can gradually increase the weight. Document Released: 12/26/2002 Document Revised: 04/03/2013 Document Reviewed: 03/29/2005 [facility] Patient Information 2015 [place], [place]. This information is not intended to replace advice given to you by your health care provider. Make sure you discuss any questions you have with your health care provider.

## 2014-12-30 DIAGNOSIS — L03115 Cellulitis of right lower limb: Secondary | ICD-10-CM | POA: Diagnosis not present

## 2014-12-30 DIAGNOSIS — M25571 Pain in right ankle and joints of right foot: Secondary | ICD-10-CM | POA: Diagnosis not present

## 2014-12-30 DIAGNOSIS — S80811S Abrasion, right lower leg, sequela: Secondary | ICD-10-CM | POA: Diagnosis not present

## 2014-12-30 DIAGNOSIS — M19071 Primary osteoarthritis, right ankle and foot: Secondary | ICD-10-CM | POA: Diagnosis not present

## 2014-12-30 DIAGNOSIS — R600 Localized edema: Secondary | ICD-10-CM | POA: Diagnosis not present

## 2015-01-06 DIAGNOSIS — R600 Localized edema: Secondary | ICD-10-CM | POA: Diagnosis not present

## 2015-01-06 DIAGNOSIS — L03115 Cellulitis of right lower limb: Secondary | ICD-10-CM | POA: Diagnosis not present

## 2015-01-06 DIAGNOSIS — M25571 Pain in right ankle and joints of right foot: Secondary | ICD-10-CM | POA: Diagnosis not present

## 2015-01-06 DIAGNOSIS — S80811S Abrasion, right lower leg, sequela: Secondary | ICD-10-CM | POA: Diagnosis not present

## 2015-01-09 ENCOUNTER — Ambulatory Visit (INDEPENDENT_AMBULATORY_CARE_PROVIDER_SITE_OTHER): Payer: Medicare PPO | Admitting: Family Medicine

## 2015-01-09 ENCOUNTER — Encounter: Payer: Self-pay | Admitting: Family Medicine

## 2015-01-09 VITALS — BP 120/60 | HR 82 | Temp 98.0°F | Resp 16 | Ht 72.0 in | Wt 187.0 lb

## 2015-01-09 DIAGNOSIS — L03115 Cellulitis of right lower limb: Secondary | ICD-10-CM

## 2015-01-09 DIAGNOSIS — T148 Other injury of unspecified body region: Secondary | ICD-10-CM

## 2015-01-09 DIAGNOSIS — L0390 Cellulitis, unspecified: Secondary | ICD-10-CM | POA: Insufficient documentation

## 2015-01-09 DIAGNOSIS — T148XXA Other injury of unspecified body region, initial encounter: Secondary | ICD-10-CM

## 2015-01-09 MED ORDER — CEPHALEXIN 500 MG PO CAPS: 500.0000 mg | ORAL_CAPSULE | Freq: Four times a day (QID) | ORAL | Status: DC

## 2015-01-09 NOTE — Progress Notes (Signed)
Patient: Jerry Mcpherson Male    DOB: 1941-04-07   74 y.o.   MRN: [id number] Visit Date: 01/09/2015  Today's Provider: Lelon Huh, MD   Chief Complaint  Patient presents with  . Cellulitis   Subjective:    HPI  Cellulitis: Patient comes in today for an evaluation of a wound on his right lower leg. Patient states he had an accident at his son's house over 2 weeks ago. Patient fell through the floor joist hitting his leg and left shoulder. Patient states he was seen at an Urgent Care in [place] the day of the accident. Patient was seen a few days later on 12-30-2014 by his Orthopedic Dr. Bettye Boeck who determined that the wound had devloped Cellulitis. Patient was started on Bactrim DS 2 tablets twice daily for 10 days. Patient took the last dose of Bactrim DS this morning and still has redness, tenderness and swelling in right lower leg, although swelling has gone down considerable. Has improved somewhat since starting Patient denies any fever or drainage from the wound.      Allergies  Allergen Reactions  . Codeine     Heart racing  . Cozaar [Losartan Potassium]     angioedema  . Cyclobenzaprine     Musculoskeletal therapy agents cause Excessive sedation  . Ultram  [Tramadol Hcl] Nausea And Vomiting  . Celebrex  [Celecoxib] Itching, Rash and Swelling   Previous Medications   ACETAMINOPHEN (TYLENOL) 500 MG TABLET    Take 2 tablets (1,000 mg total) by mouth every 6 (six) hours as needed for mild pain or moderate pain.   AMLODIPINE (NORVASC) 5 MG TABLET    Take 1 tablet by mouth daily.   ASCORBIC ACID (VITAMIN C) 1000 MG TABLET    Take 2 tablets by mouth daily.   ASPIRIN 81 MG TABLET    Take 1 tablet by mouth daily.   DOCUSATE SODIUM (COLACE) 100 MG CAPSULE    Take 1 capsule by mouth daily as needed.   MULTIPLE VITAMINS-MINERALS (MULTIVITAMIN ADULT PO)    Take 1 tablet by mouth daily.   SILDENAFIL (VIAGRA) 50 MG TABLET    Take 0.5-1 tablets by mouth daily as needed.   VALACYCLOVIR (VALTREX) 500 MG TABLET    TAKE 1 TABLET BY MOUTH TWICE DAILY FOR 3 DAYS AS NEEDED    Review of Systems  Constitutional: Negative for fever, chills, diaphoresis, appetite change and fatigue.  Respiratory: Negative for chest tightness, shortness of breath and wheezing.   Cardiovascular: Positive for leg swelling (right lower leg). Negative for chest pain and palpitations.  Gastrointestinal: Negative for nausea, vomiting and abdominal pain.  Musculoskeletal: Positive for myalgias (right lower leg) and arthralgias (left shoulder pain).  Skin: Positive for color change (redness in right lower leg) and wound.    Social History  Substance Use Topics  . Smoking status: Former Smoker -- 3.00 packs/day for 15 years    Types: Cigarettes    Quit date: 04/12/1968  . Smokeless tobacco: Not on file  . Alcohol Use: No   Objective:   BP 120/60 mmHg  Pulse 82  Temp(Src) 98 F (36.7 C) (Oral)  Resp 16  Ht 6' (1.829 m)  Wt 187 lb (84.823 kg)  BMI 25.36 kg/m2  SpO2 94%  Physical Exam  Exam: 3x2cm scabbed abrasion right anteromedial lower leg. Light erythema and tenderness extending from about 4cm above and below abrasion. No drainage. Tracing edema anterior leg. No calf tenderness, no cords.  Assessment & Plan:     1. Cellulitis of right lower extremity Marginal improvement after 10 days Septra DS. Change to cephalexin for better strep coverage.  - cephALEXin (KEFLEX) 500 MG capsule; Take 1 capsule (500 mg total) by mouth 4 (four) times daily.  Dispense: 40 capsule; Refill: 0  Call if symptoms change or if not rapidly improving.   2. Abrasion UTP on tetanus immunization       Lelon Huh, MD  [facility]

## 2015-01-26 ENCOUNTER — Other Ambulatory Visit: Payer: Self-pay | Admitting: Family Medicine

## 2015-01-30 ENCOUNTER — Ambulatory Visit (INDEPENDENT_AMBULATORY_CARE_PROVIDER_SITE_OTHER): Payer: Medicare PPO | Admitting: Family Medicine

## 2015-01-30 ENCOUNTER — Encounter: Payer: Self-pay | Admitting: Family Medicine

## 2015-01-30 VITALS — BP 128/78 | HR 86 | Temp 97.9°F | Resp 16 | Ht 72.0 in | Wt 187.0 lb

## 2015-01-30 DIAGNOSIS — L03115 Cellulitis of right lower limb: Secondary | ICD-10-CM | POA: Diagnosis not present

## 2015-01-30 MED ORDER — CEPHALEXIN 500 MG PO CAPS: 500.0000 mg | ORAL_CAPSULE | Freq: Four times a day (QID) | ORAL | Status: AC

## 2015-01-30 NOTE — Progress Notes (Signed)
Patient ID: Jerry Mcpherson, male   DOB: [birth year], 74 y.o.   MRN: [id number]       Patient: Jerry Mcpherson Male    DOB: [birth year]   74 y.o.   MRN: [id number] Visit Date: 01/30/2015  Today's Provider: Lelon Huh, MD   Chief Complaint  Patient presents with  . Cellulitis   Subjective:    HPI   Cellulitis   Patient was here on 01/09/15 and was diagnosed with cellulitis in lower right leg. Patient reports that he was initially prescribed an abx from another provider. However, after being evaluated here in the office, his abx was changed to Cephalexin. Patient reports that he completed the entire dose, but he noticed that his leg started to turn red and itchy earlier this week. Patient reports that his symptoms have not improved, and thinks he may need another abx.   Allergies  Allergen Reactions  . Codeine     Heart racing  . Cozaar [Losartan Potassium]     angioedema  . Cyclobenzaprine     Musculoskeletal therapy agents cause Excessive sedation  . Ultram  [Tramadol Hcl] Nausea And Vomiting  . Celebrex  [Celecoxib] Itching, Rash and Swelling   Previous Medications   ACETAMINOPHEN (TYLENOL) 500 MG TABLET    Take 2 tablets (1,000 mg total) by mouth every 6 (six) hours as needed for mild pain or moderate pain.   AMLODIPINE (NORVASC) 5 MG TABLET    TAKE 1 TABLET BY MOUTH EVERY DAY   ASCORBIC ACID (VITAMIN C) 1000 MG TABLET    Take 2 tablets by mouth daily.   ASPIRIN 81 MG TABLET    Take 1 tablet by mouth daily.   DOCUSATE SODIUM (COLACE) 100 MG CAPSULE    Take 1 capsule by mouth daily as needed.   MULTIPLE VITAMINS-MINERALS (MULTIVITAMIN ADULT PO)    Take 1 tablet by mouth daily.   SILDENAFIL (VIAGRA) 50 MG TABLET    Take 0.5-1 tablets by mouth daily as needed.   VALACYCLOVIR (VALTREX) 500 MG TABLET    TAKE 1 TABLET BY MOUTH TWICE DAILY FOR 3 DAYS AS NEEDED    Review of Systems  Constitutional: Negative.   Respiratory: Negative.   Cardiovascular: Negative.     Musculoskeletal: Positive for myalgias.  Skin: Positive for color change and rash.       Patient reports that skin is extremely itchy.   Neurological: Negative.     Social History  Substance Use Topics  . Smoking status: Former Smoker -- 3.00 packs/day for 15 years    Types: Cigarettes    Quit date: 04/12/1968  . Smokeless tobacco: Not on file  . Alcohol Use: No   Objective:   BP 128/78 mmHg  Pulse 86  Temp(Src) 97.9 F (36.6 C)  Resp 16  Ht 6' (1.829 m)  Wt 187 lb (84.823 kg)  BMI 25.36 kg/m2  Physical Exam   Faint area of erythema and tenderness extending from about 4cm above and below abrasion. No drainage.  No calf tenderness, no cords.     Assessment & Plan:     1. Cellulitis of right lower extremity Improving but not resolved. Get back on cephalexin and call if not resolved when finished.  - cephALEXin (KEFLEX) 500 MG capsule; Take 1 capsule (500 mg total) by mouth 4 (four) times daily.  Dispense: 40 capsule; Refill: 0       Lelon Huh, MD  [facility]

## 2015-04-04 ENCOUNTER — Ambulatory Visit
Admission: RE | Admit: 2015-04-04 | Discharge: 2015-04-04 | Disposition: A | Discharge status: Home / Self Care | Payer: Medicare PPO | Source: Ambulatory Visit | Attending: Family Medicine | Admitting: Family Medicine

## 2015-04-04 ENCOUNTER — Ambulatory Visit (INDEPENDENT_AMBULATORY_CARE_PROVIDER_SITE_OTHER): Payer: Medicare PPO | Admitting: Family Medicine

## 2015-04-04 ENCOUNTER — Encounter: Payer: Self-pay | Admitting: Family Medicine

## 2015-04-04 VITALS — BP 130/80 | HR 80 | Temp 98.3°F | Resp 18 | Wt 189.0 lb

## 2015-04-04 DIAGNOSIS — M79645 Pain in left finger(s): Secondary | ICD-10-CM

## 2015-04-04 DIAGNOSIS — M19042 Primary osteoarthritis, left hand: Secondary | ICD-10-CM | POA: Diagnosis not present

## 2015-04-04 MED ORDER — CEPHALEXIN 500 MG PO CAPS: 500.0000 mg | ORAL_CAPSULE | Freq: Four times a day (QID) | ORAL | Status: AC

## 2015-04-04 NOTE — Progress Notes (Signed)
Patient: Jerry Mcpherson Male    DOB: 1940-10-08   74 y.o.   MRN: [id number] Visit Date: 04/04/2015  Today's Provider: Lelon Huh, MD   Chief Complaint  Patient presents with  . Hand Pain   Subjective:    Hand Pain  Incident onset: 3 weeks ago. Injury mechanism: smashed 4th finger on left hand on a finishing nail or wood. The pain is at a severity of 10/10 (when touching finger). The pain has been worsening since the incident. Pertinent negatives include no chest pain, muscle weakness, numbness or tingling. Exacerbated by: touch. Treatments tried: Neosporin. The treatment provided no relief.  Patient has also had swelling in his finger. Patient states he thinks there may still be particles of wood or nail in his finger.    Allergies  Allergen Reactions  . Codeine     Heart racing  . Cozaar [Losartan Potassium]     angioedema  . Cyclobenzaprine     Musculoskeletal therapy agents cause Excessive sedation  . Ultram  [Tramadol Hcl] Nausea And Vomiting  . Celebrex  [Celecoxib] Itching, Rash and Swelling   Previous Medications   ACETAMINOPHEN (TYLENOL) 500 MG TABLET    Take 2 tablets (1,000 mg total) by mouth every 6 (six) hours as needed for mild pain or moderate pain.   AMLODIPINE (NORVASC) 5 MG TABLET    TAKE 1 TABLET BY MOUTH EVERY DAY   ASCORBIC ACID (VITAMIN C) 1000 MG TABLET    Take 2 tablets by mouth daily.   ASPIRIN 81 MG TABLET    Take 1 tablet by mouth daily.   DOCUSATE SODIUM (COLACE) 100 MG CAPSULE    Take 1 capsule by mouth daily as needed.   MULTIPLE VITAMINS-MINERALS (MULTIVITAMIN ADULT PO)    Take 1 tablet by mouth daily.   SILDENAFIL (VIAGRA) 50 MG TABLET    Take 0.5-1 tablets by mouth daily as needed.   VALACYCLOVIR (VALTREX) 500 MG TABLET    TAKE 1 TABLET BY MOUTH TWICE DAILY FOR 3 DAYS AS NEEDED    Review of Systems  Constitutional: Negative for fever, chills and appetite change.  Respiratory: Negative for chest tightness, shortness of breath  and wheezing.   Cardiovascular: Negative for chest pain and palpitations.  Gastrointestinal: Negative for nausea, vomiting and abdominal pain.  Skin: Positive for wound.  Neurological: Negative for tingling and numbness.    Social History  Substance Use Topics  . Smoking status: Former Smoker -- 3.00 packs/day for 15 years    Types: Cigarettes    Quit date: 04/12/1968  . Smokeless tobacco: Not on file  . Alcohol Use: No   Objective:   BP 130/80 mmHg  Pulse 80  Temp(Src) 98.3 F (36.8 C) (Oral)  Resp 18  Wt 189 lb (85.73 kg)  SpO2 95%  Physical Exam  Very tender with small area of redness and swelling distal right 4th finger near nail bed with small crusted lesion. No foreign objects visualized.     Assessment & Plan:     1. Pain of finger of left hand Probably cellulitis secondary to trauma. XR to rule out radio-opaque objects. Advised patient that wood and splinters are unlikely to be seen on Xrays. If not resolving on abx will consider referral dermatology.  - DG Finger Ring Left; Future - cephALEXin (KEFLEX) 500 MG capsule; Take 1 capsule (500 mg total) by mouth 4 (four) times daily.  Dispense: 40 capsule; Refill: 0  Lelon Huh, MD  [facility]

## 2015-04-11 DIAGNOSIS — L03114 Cellulitis of left upper limb: Secondary | ICD-10-CM | POA: Diagnosis not present

## 2015-04-16 DIAGNOSIS — S60455A Superficial foreign body of left ring finger, initial encounter: Secondary | ICD-10-CM | POA: Diagnosis not present

## 2015-06-10 ENCOUNTER — Encounter: Payer: Self-pay | Admitting: Family Medicine

## 2015-06-10 ENCOUNTER — Ambulatory Visit (INDEPENDENT_AMBULATORY_CARE_PROVIDER_SITE_OTHER): Payer: Medicare PPO | Admitting: Family Medicine

## 2015-06-10 VITALS — BP 120/60 | HR 100 | Temp 98.3°F | Resp 18 | Wt 190.0 lb

## 2015-06-10 DIAGNOSIS — J019 Acute sinusitis, unspecified: Secondary | ICD-10-CM | POA: Diagnosis not present

## 2015-06-10 MED ORDER — AMOXICILLIN 500 MG PO CAPS: 1000.0000 mg | ORAL_CAPSULE | Freq: Two times a day (BID) | ORAL | Status: AC

## 2015-06-10 NOTE — Progress Notes (Signed)
Patient: Jerry Mcpherson Male    DOB: 1940-12-30   75 y.o.   MRN: [id number] Visit Date: 06/10/2015  Today's Provider: Lelon Huh, MD   Chief Complaint  Patient presents with  . URI   Subjective:    URI  This is a new problem. Episode onset: 4 days ago; symtoms started as a dry cough then progressed into swelling of the sinus cavities. The problem has been gradually worsening. Maximum temperature: 101. Associated symptoms include congestion, coughing (productive with green/ yellow phlegm), headaches (throbbing pain in head), rhinorrhea, sinus pain, sneezing, a sore throat (mild) and swollen glands. Pertinent negatives include no abdominal pain, chest pain, diarrhea, dysuria, ear pain, joint pain, joint swelling, nausea, neck pain, plugged ear sensation, rash, vomiting or wheezing. He has tried acetaminophen (also tried Coricidin HBP) for the symptoms. The treatment provided no relief.   Started with swelling and pressure of sinuses on 06/06/2015, and low grade fevers with cough productive yellow and green sputum the next day.     Allergies  Allergen Reactions  . Codeine     Heart racing  . Cozaar [Losartan Potassium]     angioedema  . Cyclobenzaprine     Musculoskeletal therapy agents cause Excessive sedation  . Ultram  [Tramadol Hcl] Nausea And Vomiting  . Celebrex  [Celecoxib] Itching, Rash and Swelling   Previous Medications   ACETAMINOPHEN (TYLENOL) 500 MG TABLET    Take 2 tablets (1,000 mg total) by mouth every 6 (six) hours as needed for mild pain or moderate pain.   AMLODIPINE (NORVASC) 5 MG TABLET    TAKE 1 TABLET BY MOUTH EVERY DAY   ASCORBIC ACID (VITAMIN C) 1000 MG TABLET    Take 2 tablets by mouth daily.   ASPIRIN 81 MG TABLET    Take 1 tablet by mouth daily.   DOCUSATE SODIUM (COLACE) 100 MG CAPSULE    Take 1 capsule by mouth daily as needed.   MULTIPLE VITAMINS-MINERALS (MULTIVITAMIN ADULT PO)    Take 1 tablet by mouth daily.   SILDENAFIL (VIAGRA) 50 MG  TABLET    Take 0.5-1 tablets by mouth daily as needed.   VALACYCLOVIR (VALTREX) 500 MG TABLET    TAKE 1 TABLET BY MOUTH TWICE DAILY FOR 3 DAYS AS NEEDED    Review of Systems  Constitutional: Positive for fever, chills and fatigue. Negative for appetite change.  HENT: Positive for congestion, postnasal drip, rhinorrhea, sneezing and sore throat (mild). Negative for ear pain, mouth sores and nosebleeds.   Respiratory: Positive for cough (productive with green/ yellow phlegm). Negative for chest tightness, shortness of breath and wheezing.   Cardiovascular: Negative for chest pain and palpitations.  Gastrointestinal: Negative for nausea, vomiting, abdominal pain and diarrhea.  Genitourinary: Negative for dysuria.  Musculoskeletal: Negative for joint pain and neck pain.  Skin: Negative for rash.  Neurological: Positive for weakness and headaches (throbbing pain in head). Negative for dizziness.    Social History  Substance Use Topics  . Smoking status: Former Smoker -- 3.00 packs/day for 15 years    Types: Cigarettes    Quit date: 04/12/1968  . Smokeless tobacco: Not on file  . Alcohol Use: No   Objective:   BP 120/60 mmHg  Pulse 100  Temp(Src) 98.3 F (36.8 C) (Oral)  Resp 18  Wt 190 lb (86.183 kg)  SpO2 94%  Physical Exam  General Appearance:    Alert, cooperative, no distress  HENT:   bilateral TM normal  without fluid or infection, neck without nodes, throat normal without erythema or exudate, maxillary sinus tender and nasal mucosa congested  Eyes:    PERRL, conjunctiva/corneas clear, EOM's intact       Lungs:     Clear to auscultation bilaterally, respirations unlabored  Heart:    Regular rate and rhythm  Neurologic:   Awake, alert, oriented x 3. No apparent focal neurological           defect.            Assessment & Plan:     1. Acute sinusitis, recurrence not specified, unspecified location  - amoxicillin (AMOXIL) 500 MG capsule; Take 2 capsules (1,000 mg total)  by mouth 2 (two) times daily.  Dispense: 40 capsule; Refill: 0  Call if symptoms change or if not rapidly improving.          Lelon Huh, MD  [facility]

## 2015-06-30 ENCOUNTER — Encounter: Payer: Self-pay | Admitting: Family Medicine

## 2015-06-30 ENCOUNTER — Ambulatory Visit (INDEPENDENT_AMBULATORY_CARE_PROVIDER_SITE_OTHER): Payer: Medicare PPO | Admitting: Family Medicine

## 2015-06-30 VITALS — BP 130/80 | HR 87 | Temp 98.0°F | Resp 16 | Wt 189.0 lb

## 2015-06-30 DIAGNOSIS — R6883 Chills (without fever): Secondary | ICD-10-CM

## 2015-06-30 DIAGNOSIS — K529 Noninfective gastroenteritis and colitis, unspecified: Secondary | ICD-10-CM | POA: Diagnosis not present

## 2015-06-30 DIAGNOSIS — R42 Dizziness and giddiness: Secondary | ICD-10-CM

## 2015-06-30 LAB — POCT INFLUENZA A/B
Influenza A, POC: NEGATIVE
Influenza B, POC: NEGATIVE

## 2015-06-30 MED ORDER — PROMETHAZINE HCL 12.5 MG PO TABS: 12.5000 mg | ORAL_TABLET | Freq: Three times a day (TID) | ORAL | Status: DC | PRN

## 2015-06-30 NOTE — Progress Notes (Signed)
Patient: Jerry Mcpherson Male    DOB: [birth year]   75 y.o.   MRN: [id number] Visit Date: 06/30/2015  Today's Provider: Lelon Huh, MD   Chief Complaint  Patient presents with  . URI   Subjective:    URI  This is a new problem. The current episode started today (woke up this morning at 7am with symptoms). The problem has been gradually improving. Maximum temperature: subjetive- patient states he felt like he had a fever. Associated symptoms include abdominal pain, congestion, coughing (dry), ear pain (right ear), headaches, nausea, neck pain, rhinorrhea, sinus pain, a sore throat, swollen glands and vomiting. Pertinent negatives include no chest pain, diarrhea, dysuria, joint pain, joint swelling, plugged ear sensation, rash, sneezing or wheezing. He has tried nothing for the symptoms.  States he woke up this morning feeling dizzy then starting having nausea, vomiting and abdominal pain. Also had nasal congestion, mild dry cough, soreness in his right ear, and headache. His stomach is not hurting anymore, but still getting spinning sensation when he turns his head. Has had no diarrhea. Head congestion and cough have mostly resolve through the course of the day. He does have a history of vertigo with similar symptoms in the past.      Allergies  Allergen Reactions  . Codeine     Heart racing  . Cozaar [Losartan Potassium]     angioedema  . Cyclobenzaprine     Musculoskeletal therapy agents cause Excessive sedation  . Ultram  [Tramadol Hcl] Nausea And Vomiting  . Celebrex  [Celecoxib] Itching, Rash and Swelling   Previous Medications   ACETAMINOPHEN (TYLENOL) 500 MG TABLET    Take 2 tablets (1,000 mg total) by mouth every 6 (six) hours as needed for mild pain or moderate pain.   AMLODIPINE (NORVASC) 5 MG TABLET    TAKE 1 TABLET BY MOUTH EVERY DAY   ASCORBIC ACID (VITAMIN C) 1000 MG TABLET    Take 2 tablets by mouth daily.   ASPIRIN 81 MG TABLET    Take 1 tablet by mouth  daily.   DOCUSATE SODIUM (COLACE) 100 MG CAPSULE    Take 1 capsule by mouth daily as needed.   MULTIPLE VITAMINS-MINERALS (MULTIVITAMIN ADULT PO)    Take 1 tablet by mouth daily.   SILDENAFIL (VIAGRA) 50 MG TABLET    Take 0.5-1 tablets by mouth daily as needed.   VALACYCLOVIR (VALTREX) 500 MG TABLET    TAKE 1 TABLET BY MOUTH TWICE DAILY FOR 3 DAYS AS NEEDED    Review of Systems  Constitutional: Positive for chills, diaphoresis and fatigue. Negative for fever and appetite change.  HENT: Positive for congestion, ear pain (right ear), nosebleeds, postnasal drip, rhinorrhea, sinus pressure and sore throat. Negative for mouth sores, sneezing and tinnitus.   Eyes: Negative for photophobia, pain, discharge, redness, itching and visual disturbance.  Respiratory: Positive for cough (dry). Negative for chest tightness, shortness of breath and wheezing.   Cardiovascular: Negative for chest pain and palpitations.  Gastrointestinal: Positive for nausea, vomiting and abdominal pain. Negative for diarrhea.  Genitourinary: Negative for dysuria.  Musculoskeletal: Positive for myalgias and neck pain. Negative for joint pain.  Skin: Negative for rash.  Neurological: Positive for dizziness, weakness, light-headedness and headaches.    Social History  Substance Use Topics  . Smoking status: Former Smoker -- 3.00 packs/day for 15 years    Types: Cigarettes    Quit date: 04/12/1968  . Smokeless tobacco: Not on file  .  Alcohol Use: No   Objective:   BP 130/80 mmHg  Pulse 87  Temp(Src) 98 F (36.7 C) (Oral)  Resp 16  Wt 189 lb (85.73 kg)  SpO2 94%  Physical Exam  General Appearance:    Alert, cooperative, no distress  HENT:   ENT exam normal, no neck nodes or sinus tenderness  Eyes:    PERRL, conjunctiva/corneas clear, EOM's intact   Horizontal nystagmus noted    Lungs:     Clear to auscultation bilaterally, respirations unlabored  Heart:    Regular rate and rhythm  Abd:   Soft, non tender,  non-distended. Hypoactive bowel sounds in all quadrants.       Results for orders placed or performed in visit on 06/30/15  POCT Influenza A/B  Result Value Ref Range   Influenza A, POC Negative Negative   Influenza B, POC Negative Negative        Assessment & Plan:     1. Chills  - POCT Influenza A/B  2. Gastroenteritis Possible diagnosis. Advised if this is case he may develop diarrhea over the next 24 hours. encouraged frequent sips of of clear liquids  3. Vertigo He did have some mild URI symptoms this morning which have mostly resolved, but may have set off vertigo. He does not tolerate meclizine, so was given prescription of phenergan 12.5 to take prn    Call if symptoms change or if not rapidly improving.          Lelon Huh, MD  [facility]

## 2015-07-03 DIAGNOSIS — H8113 Benign paroxysmal vertigo, bilateral: Secondary | ICD-10-CM | POA: Diagnosis not present

## 2015-09-07 ENCOUNTER — Other Ambulatory Visit: Payer: Self-pay | Admitting: Family Medicine

## 2015-10-26 DIAGNOSIS — E663 Overweight: Secondary | ICD-10-CM | POA: Diagnosis not present

## 2015-10-26 DIAGNOSIS — Z6825 Body mass index (BMI) 25.0-25.9, adult: Secondary | ICD-10-CM | POA: Diagnosis not present

## 2015-10-26 DIAGNOSIS — I10 Essential (primary) hypertension: Secondary | ICD-10-CM | POA: Diagnosis not present

## 2015-10-26 DIAGNOSIS — Z87891 Personal history of nicotine dependence: Secondary | ICD-10-CM | POA: Diagnosis not present

## 2015-11-03 ENCOUNTER — Encounter: Payer: Self-pay | Admitting: Family Medicine

## 2015-11-03 ENCOUNTER — Ambulatory Visit (INDEPENDENT_AMBULATORY_CARE_PROVIDER_SITE_OTHER): Payer: Medicare PPO | Admitting: Family Medicine

## 2015-11-03 VITALS — BP 128/78 | HR 83 | Temp 97.7°F | Resp 16 | Wt 191.0 lb

## 2015-11-03 DIAGNOSIS — W57XXXA Bitten or stung by nonvenomous insect and other nonvenomous arthropods, initial encounter: Secondary | ICD-10-CM | POA: Diagnosis not present

## 2015-11-03 DIAGNOSIS — L539 Erythematous condition, unspecified: Secondary | ICD-10-CM

## 2015-11-03 DIAGNOSIS — S70362A Insect bite (nonvenomous), left thigh, initial encounter: Secondary | ICD-10-CM | POA: Diagnosis not present

## 2015-11-03 NOTE — Progress Notes (Signed)
       Patient: Jerry Mcpherson Male    DOB: [birth year]   75 y.o.   MRN: [id number] Visit Date: 11/03/2015  Today's Provider: Lelon Huh, MD   Chief Complaint  Patient presents with  . Insect Bite    tic bite 10 days ago   Subjective:    HPI  Tick Bite:   Patient comes in today stating he was bit by a tick on his upper left thigh 10 days ago. He thinks the tick was there for about a day. Patient has some redness and soreness in the location where the tick bit him. He states tick was about the size of pencil eraser.     Allergies  Allergen Reactions  . Codeine     Heart racing  . Cozaar [Losartan Potassium]     angioedema  . Cyclobenzaprine     Musculoskeletal therapy agents cause Excessive sedation  . Ultram  [Tramadol Hcl] Nausea And Vomiting  . Celebrex  [Celecoxib] Itching, Rash and Swelling   Current Meds  Medication Sig  . acetaminophen (TYLENOL) 500 MG tablet Take 2 tablets (1,000 mg total) by mouth every 6 (six) hours as needed for mild pain or moderate pain.  Marland Kitchen amLODipine (NORVASC) 5 MG tablet TAKE 1 TABLET BY MOUTH EVERY DAY  . Ascorbic Acid (VITAMIN C) 1000 MG tablet Take 2 tablets by mouth daily.  Marland Kitchen aspirin 81 MG tablet Take 1 tablet by mouth daily.  Marland Kitchen docusate sodium (COLACE) 100 MG capsule Take 1 capsule by mouth daily as needed.  . Multiple Vitamins-Minerals (MULTIVITAMIN ADULT PO) Take 1 tablet by mouth daily.  . promethazine (PHENERGAN) 12.5 MG tablet Take 1-2 tablets (12.5-25 mg total) by mouth every 8 (eight) hours as needed for nausea or vomiting.  . sildenafil (VIAGRA) 50 MG tablet Take 0.5-1 tablets by mouth daily as needed.  . valACYclovir (VALTREX) 500 MG tablet TAKE 1 TABLET BY MOUTH TWICE DAILY FOR 3 DAYS AS NEEDED    Review of Systems  Constitutional: Positive for chills. Negative for appetite change and fever.  Respiratory: Negative for chest tightness, shortness of breath and wheezing.   Cardiovascular: Negative for chest pain and  palpitations.  Gastrointestinal: Negative for abdominal pain, nausea and vomiting.  Skin: Positive for color change and wound.  Neurological: Positive for headaches.    Social History  Substance Use Topics  . Smoking status: Former Smoker    Packs/day: 3.00    Years: 15.00    Types: Cigarettes    Quit date: 04/12/1968  . Smokeless tobacco: Former Systems developer  . Alcohol use No   Objective:   BP 128/78 (BP Location: Left Arm, Patient Position: Sitting, Cuff Size: Large)   Pulse 83   Temp 97.7 F (36.5 C) (Oral)   Resp 16   Wt 191 lb (86.6 kg)   SpO2 97% Comment: room air  BMI 25.90 kg/m   Physical Exam  Small papular lesion left antero-medial proximal thigh with tiny black spot in center. Deroofed with #20 scalpel and appears to be tiny clot of blood . No remaining foreign bodies. No surrounding erythema.     Assessment & Plan:     1. Tick bite of left thigh, initial encounter No sign tick borne or secondary skin infection.        Lelon Huh, MD  [facility]

## 2016-01-28 ENCOUNTER — Ambulatory Visit (INDEPENDENT_AMBULATORY_CARE_PROVIDER_SITE_OTHER): Payer: Medicare PPO | Admitting: Family Medicine

## 2016-01-28 ENCOUNTER — Encounter: Payer: Self-pay | Admitting: Family Medicine

## 2016-01-28 VITALS — BP 138/78 | HR 94 | Temp 97.7°F | Resp 16 | Wt 190.0 lb

## 2016-01-28 DIAGNOSIS — Z125 Encounter for screening for malignant neoplasm of prostate: Secondary | ICD-10-CM

## 2016-01-28 DIAGNOSIS — R319 Hematuria, unspecified: Secondary | ICD-10-CM

## 2016-01-28 DIAGNOSIS — I10 Essential (primary) hypertension: Secondary | ICD-10-CM | POA: Diagnosis not present

## 2016-01-28 DIAGNOSIS — Z23 Encounter for immunization: Secondary | ICD-10-CM | POA: Diagnosis not present

## 2016-01-28 LAB — POCT URINALYSIS DIPSTICK
BILIRUBIN UA: NEGATIVE
Glucose, UA: NEGATIVE
Ketones, UA: NEGATIVE
LEUKOCYTES UA: NEGATIVE
NITRITE UA: NEGATIVE
Protein, UA: NEGATIVE
RBC UA: NEGATIVE
Spec Grav, UA: 1.025
UROBILINOGEN UA: 0.2
pH, UA: 6

## 2016-01-28 NOTE — Progress Notes (Signed)
Patient: Jerry Mcpherson Male    DOB: 12-11-1940   75 y.o.   MRN: [id number] Visit Date: 01/28/2016  Today's Provider: Lelon Huh, MD   Chief Complaint  Patient presents with  . Hypertension    follow up  . Hematuria   Subjective:    HPI  Hypertension, follow-up:  BP Readings from Last 3 Encounters:  11/03/15 128/78  06/30/15 130/80  06/10/15 120/60    He was last seen for hypertension 3 years ago.  BP at that visit was 124/60. Management since that visit includes no changes. He reports good compliance with treatment. He is not having side effects.  He is not exercising. He is not adherent to low salt diet.   Outside blood pressures are not being checked. He is experiencing none.  Patient denies chest pain, chest pressure/discomfort, claudication, dyspnea, exertional chest pressure/discomfort, fatigue, irregular heart beat, lower extremity edema, near-syncope, orthopnea, palpitations, paroxysmal nocturnal dyspnea, syncope and tachypnea.   Cardiovascular risk factors include advanced age (older than 76 for men, 20 for women) and hypertension.  Use of agents associated with hypertension: NSAIDS.     Weight trend: stable Wt Readings from Last 3 Encounters:  11/03/15 191 lb (86.6 kg)  06/30/15 189 lb (85.7 kg)  06/10/15 190 lb (86.2 kg)    Current diet: in general, an "unhealthy" diet  ------------------------------------------------------------------------ Hematuria: Patient comes in stating last week he had a single episode of blood in his urine. Associated symptoms includes a single episode if dysuria. Patient reports that symptoms are now resolved. States he had kidney stones back in the 80s and had blood in urine then, but none since until last week.     Allergies  Allergen Reactions  . Codeine     Heart racing  . Cozaar [Losartan Potassium]     angioedema  . Cyclobenzaprine     Musculoskeletal therapy agents cause Excessive sedation  .  Ultram  [Tramadol Hcl] Nausea And Vomiting  . Celebrex  [Celecoxib] Itching, Rash and Swelling     Current Outpatient Prescriptions:  .  acetaminophen (TYLENOL) 500 MG tablet, Take 2 tablets (1,000 mg total) by mouth every 6 (six) hours as needed for mild pain or moderate pain., Disp: 100 tablet, Rfl: 0 .  amLODipine (NORVASC) 5 MG tablet, TAKE 1 TABLET BY MOUTH EVERY DAY, Disp: 30 tablet, Rfl: 9 .  Ascorbic Acid (VITAMIN C) 1000 MG tablet, Take 2 tablets by mouth daily., Disp: , Rfl:  .  aspirin 81 MG tablet, Take 1 tablet by mouth daily., Disp: , Rfl:  .  docusate sodium (COLACE) 100 MG capsule, Take 1 capsule by mouth daily as needed., Disp: , Rfl:  .  Multiple Vitamins-Minerals (MULTIVITAMIN ADULT PO), Take 1 tablet by mouth daily., Disp: , Rfl:  .  promethazine (PHENERGAN) 12.5 MG tablet, Take 1-2 tablets (12.5-25 mg total) by mouth every 8 (eight) hours as needed for nausea or vomiting., Disp: 12 tablet, Rfl: 0 .  sildenafil (VIAGRA) 50 MG tablet, Take 0.5-1 tablets by mouth daily as needed., Disp: , Rfl:  .  valACYclovir (VALTREX) 500 MG tablet, TAKE 1 TABLET BY MOUTH TWICE DAILY FOR 3 DAYS AS NEEDED, Disp: 6 tablet, Rfl: 3  Review of Systems  Constitutional: Negative for appetite change, chills, diaphoresis, fatigue and fever.  Respiratory: Negative for chest tightness, shortness of breath and wheezing.   Cardiovascular: Negative for chest pain and palpitations.  Gastrointestinal: Negative for abdominal pain, nausea and vomiting.  Endocrine:  Negative for cold intolerance, heat intolerance, polydipsia, polyphagia and polyuria.  Genitourinary: Positive for dysuria (resolved) and hematuria (resolved).    Social History  Substance Use Topics  . Smoking status: Former Smoker    Packs/day: 3.00    Years: 15.00    Types: Cigarettes    Quit date: 04/12/1968  . Smokeless tobacco: Former Systems developer  . Alcohol use No   Objective:   BP 138/78 (BP Location: Left Arm, Patient Position: Sitting,  Cuff Size: Normal)   Pulse 94   Temp 97.7 F (36.5 C) (Oral)   Resp 16   Wt 190 lb (86.2 kg)   SpO2 96% Comment: room air  BMI 25.77 kg/m   Physical Exam   General Appearance:    Alert, cooperative, no distress  Eyes:    PERRL, conjunctiva/corneas clear, EOM's intact       Lungs:     Clear to auscultation bilaterally, respirations unlabored  Heart:    Regular rate and rhythm  Neurologic:   Awake, alert, oriented x 3. No apparent focal neurological           defect.       Results for orders placed or performed in visit on 01/28/16  POCT Urinalysis Dipstick  Result Value Ref Range   Color, UA amber    Clarity, UA clear    Glucose, UA negative    Bilirubin, UA negative    Ketones, UA negative    Spec Grav, UA 1.025    Blood, UA negative    pH, UA 6.0    Protein, UA negative    Urobilinogen, UA 0.2    Nitrite, UA negative    Leukocytes, UA Negative Negative       Assessment & Plan:     1. Hematuria, unspecified type Single episode associated with burning with urination, and completely asymptomatic since with negative u/a today. Likely had minor urethral trauma. He is to call if has any recurrence of symptoms.  - POCT Urinalysis Dipstick  2. Essential hypertension Well controlled.  Continue current medications.   - Renal function panel  3. Need for influenza vaccination  - Flu vaccine HIGH DOSE PF (Fluzone High dose)  4. Prostate cancer screening  - PSA       Lelon Huh, MD  [facility]

## 2016-01-28 NOTE — Patient Instructions (Signed)
Please contact my office is you have any episodes of blood in urine

## 2016-01-29 LAB — RENAL FUNCTION PANEL
Albumin: 4.5 g/dL (ref 3.5–4.8)
BUN / CREAT RATIO: 17 (ref 10–24)
BUN: 18 mg/dL (ref 8–27)
CO2: 24 mmol/L (ref 18–29)
Calcium: 9.4 mg/dL (ref 8.6–10.2)
Chloride: 103 mmol/L (ref 96–106)
Creatinine, Ser: 1.06 mg/dL (ref 0.76–1.27)
GFR calc Af Amer: 78 mL/min/{1.73_m2} (ref >59)
GFR calc non Af Amer: 67 mL/min/{1.73_m2} (ref >59)
Glucose: 86 mg/dL (ref 65–99)
Phosphorus: 4 mg/dL (ref 2.5–4.5)
Potassium: 4.9 mmol/L (ref 3.5–5.2)
Sodium: 145 mmol/L — ABNORMAL HIGH (ref 134–144)

## 2016-01-29 LAB — PSA: Prostate Specific Ag, Serum: 0.5 ng/mL (ref 0.0–4.0)

## 2016-02-27 ENCOUNTER — Ambulatory Visit (INDEPENDENT_AMBULATORY_CARE_PROVIDER_SITE_OTHER): Payer: Medicare PPO | Admitting: Family Medicine

## 2016-02-27 ENCOUNTER — Encounter: Payer: Self-pay | Admitting: Family Medicine

## 2016-02-27 DIAGNOSIS — J0100 Acute maxillary sinusitis, unspecified: Secondary | ICD-10-CM

## 2016-02-27 DIAGNOSIS — J329 Chronic sinusitis, unspecified: Secondary | ICD-10-CM | POA: Insufficient documentation

## 2016-02-27 MED ORDER — AZITHROMYCIN 250 MG PO TABS: ORAL_TABLET | ORAL | 0 refills | Status: AC

## 2016-02-27 NOTE — Progress Notes (Signed)
Patient: Jerry Mcpherson    DOB: [birth year]   75 y.o.   MRN: [id number] Visit Date: 02/27/2016  Today's Provider: Lelon Huh, MD   Chief Complaint  Patient presents with  . Sinusitis    X 2 days.    Subjective:    HPI  Patient comes in today c/o possible sinus infection. He reports that he has had symptoms X 2 days. Patient reports that he has not taken anything OTC due to a possible interaction with his BP medication. Patient reports that he has had a low grade fever (up to 100), cough, congestion, bilateral ear fullness, and sore throat.     Allergies  Allergen Reactions  . Codeine     Heart racing  . Cozaar [Losartan Potassium]     angioedema  . Cyclobenzaprine     Musculoskeletal therapy agents cause Excessive sedation  . Ultram  [Tramadol Hcl] Nausea And Vomiting  . Celebrex  [Celecoxib] Itching, Rash and Swelling     Current Outpatient Prescriptions:  .  acetaminophen (TYLENOL) 500 MG tablet, Take 2 tablets (1,000 mg total) by mouth every 6 (six) hours as needed for mild pain or moderate pain., Disp: 100 tablet, Rfl: 0 .  amLODipine (NORVASC) 5 MG tablet, TAKE 1 TABLET BY MOUTH EVERY DAY, Disp: 30 tablet, Rfl: 9 .  Ascorbic Acid (VITAMIN C) 1000 MG tablet, Take 2 tablets by mouth daily., Disp: , Rfl:  .  aspirin 81 MG tablet, Take 1 tablet by mouth daily., Disp: , Rfl:  .  docusate sodium (COLACE) 100 MG capsule, Take 1 capsule by mouth daily as needed., Disp: , Rfl:  .  Multiple Vitamins-Minerals (MULTIVITAMIN ADULT PO), Take 1 tablet by mouth daily., Disp: , Rfl:  .  promethazine (PHENERGAN) 12.5 MG tablet, Take 1-2 tablets (12.5-25 mg total) by mouth every 8 (eight) hours as needed for nausea or vomiting., Disp: 12 tablet, Rfl: 0 .  sildenafil (VIAGRA) 50 MG tablet, Take 0.5-1 tablets by mouth daily as needed., Disp: , Rfl:  .  valACYclovir (VALTREX) 500 MG tablet, TAKE 1 TABLET BY MOUTH TWICE DAILY FOR 3 DAYS AS NEEDED, Disp: 6 tablet, Rfl:  3  Review of Systems  Constitutional: Positive for activity change, chills and fatigue.  HENT: Positive for congestion, ear discharge, postnasal drip, sinus pain, sinus pressure and sore throat.   Respiratory: Positive for cough.   Cardiovascular: Negative for leg swelling.       Describes it as tightness.     Social History  Substance Use Topics  . Smoking status: Former Smoker    Packs/day: 3.00    Years: 15.00    Types: Cigarettes    Quit date: 04/12/1968  . Smokeless tobacco: Former Systems developer  . Alcohol use No   Objective:   BP 128/78 (BP Location: Left Arm, Patient Position: Sitting, Cuff Size: Normal)   Pulse 88   Temp 98.8 F (37.1 C)   Resp 20   Wt 190 lb (86.2 kg)   BMI 25.77 kg/m   Physical Exam   General Appearance:    Alert, cooperative, no distress  HENT:   both sides TM normal without fluid or infection, neck without nodes, throat normal without erythema or exudate, maxillary sinuses tender, post nasal drip noted and nasal mucosa pale and congested  Eyes:    PERRL, conjunctiva/corneas clear, EOM's intact       Lungs:     Clear to auscultation bilaterally, respirations unlabored  Heart:  Regular rate and rhythm  Neurologic:   Awake, alert, oriented x 3. No apparent focal neurological           defect.            Assessment & Plan:     1. Acute non-recurrent maxillary sinusitis  - azithromycin (ZITHROMAX) 250 MG tablet; 2 by mouth today, then 1 daily for 4 days  Dispense: 6 tablet; Refill: 0 Patient Instructions   You can take OTC Mucinex (guaifenesin) for chest or sinus congestion and to help your cough       The entirety of the information documented in the History of Present Illness, Review of Systems and Physical Exam were personally obtained by me. Portions of this information were initially documented by Wilburt Finlay, CMA and reviewed by me for thoroughness and accuracy.     Lelon Huh, MD  [facility]

## 2016-02-27 NOTE — Patient Instructions (Signed)
   You can take OTC Mucinex (guaifenesin) for chest or sinus congestion and to help your cough

## 2016-07-07 ENCOUNTER — Ambulatory Visit (INDEPENDENT_AMBULATORY_CARE_PROVIDER_SITE_OTHER): Payer: Medicare Other | Admitting: Family Medicine

## 2016-07-07 ENCOUNTER — Encounter: Payer: Self-pay | Admitting: Family Medicine

## 2016-07-07 VITALS — BP 120/74 | HR 84 | Temp 97.7°F | Resp 16 | Wt 188.4 lb

## 2016-07-07 DIAGNOSIS — R42 Dizziness and giddiness: Secondary | ICD-10-CM | POA: Diagnosis not present

## 2016-07-07 NOTE — Progress Notes (Signed)
Patient: Jerry Mcpherson Male    DOB: 04/02/1941   76 y.o.   MRN: [id number] Visit Date: 07/07/2016  Today's Provider: Lelon Huh, MD   Chief Complaint  Patient presents with  . Dizziness   Subjective:    Dizziness  This is a new problem. The current episode started in the past 7 days. The problem occurs intermittently. The problem has been unchanged. Associated symptoms include fatigue, headaches, nausea, vertigo and a visual change. Pertinent negatives include no abdominal pain, anorexia, arthralgias, change in bowel habit, chest pain, chills, congestion, coughing, diaphoresis, fever, joint swelling, myalgias, neck pain, numbness, rash, swollen glands, urinary symptoms, vomiting or weakness. Nothing (Patient reports that nothing seems to aggravate onset of symptom, but states that he had episodes more when he is laying down or turning over in bed) aggravates the symptoms. He has tried nothing for the symptoms. The treatment provided no relief.   He has extensive history of BPV and was on long course of meclizine several years ago which was ineffective. He states the only thing that cures his vertigo is Tilt table maneuvers by ENT and he request referral to be seen ASAP. He thinks that meclizine is worthless and refuses to take it.     Allergies  Allergen Reactions  . Codeine     Heart racing  . Cozaar [Losartan Potassium]     angioedema  . Cyclobenzaprine     Musculoskeletal therapy agents cause Excessive sedation  . Ultram  [Tramadol Hcl] Nausea And Vomiting  . Celebrex  [Celecoxib] Itching, Rash and Swelling     Current Outpatient Prescriptions:  .  acetaminophen (TYLENOL) 500 MG tablet, Take 2 tablets (1,000 mg total) by mouth every 6 (six) hours as needed for mild pain or moderate pain., Disp: 100 tablet, Rfl: 0 .  amLODipine (NORVASC) 5 MG tablet, TAKE 1 TABLET BY MOUTH EVERY DAY, Disp: 30 tablet, Rfl: 9 .  Ascorbic Acid (VITAMIN C) 1000 MG tablet, Take 2  tablets by mouth daily., Disp: , Rfl:  .  aspirin 81 MG tablet, Take 1 tablet by mouth daily., Disp: , Rfl:  .  docusate sodium (COLACE) 100 MG capsule, Take 1 capsule by mouth daily as needed., Disp: , Rfl:  .  Multiple Vitamins-Minerals (MULTIVITAMIN ADULT PO), Take 1 tablet by mouth daily., Disp: , Rfl:  .  sildenafil (VIAGRA) 50 MG tablet, Take 0.5-1 tablets by mouth daily as needed., Disp: , Rfl:  .  valACYclovir (VALTREX) 500 MG tablet, TAKE 1 TABLET BY MOUTH TWICE DAILY FOR 3 DAYS AS NEEDED, Disp: 6 tablet, Rfl: 3  Review of Systems  Constitutional: Positive for fatigue. Negative for chills, diaphoresis and fever.  HENT: Negative for congestion.   Respiratory: Negative for cough.   Cardiovascular: Negative for chest pain.  Gastrointestinal: Positive for nausea. Negative for abdominal pain, anorexia, change in bowel habit and vomiting.  Musculoskeletal: Negative for arthralgias, joint swelling, myalgias and neck pain.  Skin: Negative for rash.  Neurological: Positive for dizziness, vertigo and headaches. Negative for weakness and numbness.    Social History  Substance Use Topics  . Smoking status: Former Smoker    Packs/day: 3.00    Years: 15.00    Types: Cigarettes    Quit date: 04/12/1968  . Smokeless tobacco: Former Systems developer  . Alcohol use No   Objective:   BP 120/74   Pulse 84   Temp 97.7 F (36.5 C) (Oral)   Resp 16   Wt  188 lb 6.4 oz (85.5 kg)   SpO2 97%   BMI 25.55 kg/m  Vitals:   07/07/16 1538  BP: 120/74  Pulse: 84  Resp: 16  Temp: 97.7 F (36.5 C)  TempSrc: Oral  SpO2: 97%  Weight: 188 lb 6.4 oz (85.5 kg)     Physical Exam   General Appearance:    Alert, cooperative, no distress  HEENT:     Positive right Dix-Hallpike maneuver   Eyes:    PERRL, conjunctiva/corneas clear, EOM's intact       Lungs:     Clear to auscultation bilaterally, respirations unlabored  Heart:    Regular rate and rhythm  Neurologic:   Awake, alert, oriented x 3. No apparent  focal neurological           defect.           Assessment & Plan:     1. Vertigo Performed Epley maneuver in exam room and given instructions on home Epley maneuvers. He is certain the only effective treatment is on tilt table and desires referral to ENT ASAP.  - Ambulatory referral to ENT       Lelon Huh, MD  [facility]

## 2016-07-07 NOTE — Patient Instructions (Addendum)
  Epley Maneuver      Sit on the edge of your bed. Turn your head 45 degrees to the right(not as far as your left shoulder). Place a pillow under you so when you lie down, it rests between your shoulders rather than under your head.     Quickly lie down on your back, with your head on the bed (still at the 45-degree angle). The pillow should be under your shoulders. Wait 30 seconds (for any vertigo to stop).     Turn your head halfway (90 degrees) to the left without raising it. Wait 30 seconds.     Turn your head and body on its side to the left, so you're looking at the floor. Wait 30 seconds.     Slowly sit up, but remain on the bed a few minutes.   Do these movements three times before going to bed each night, until you've gone 24 hours without dizziness.

## 2016-07-09 ENCOUNTER — Other Ambulatory Visit: Payer: Self-pay | Admitting: Family Medicine

## 2016-07-12 ENCOUNTER — Telehealth: Payer: Self-pay | Admitting: Family Medicine

## 2016-07-12 NOTE — Telephone Encounter (Signed)
FYI--[facility] states the soonest they are able to get pt into their office is 07/21/16.I advised pt that I could try to get him in maybe somewhere in [place] sooner.He states that he is feeling somewhat better and chose to keep appointment for 07/21/16

## 2016-07-20 ENCOUNTER — Ambulatory Visit
Admission: RE | Admit: 2016-07-20 | Discharge: 2016-07-20 | Disposition: A | Discharge status: Home / Self Care | Payer: Medicare Other | Source: Ambulatory Visit | Attending: Family Medicine | Admitting: Family Medicine

## 2016-07-20 ENCOUNTER — Telehealth: Payer: Self-pay

## 2016-07-20 ENCOUNTER — Ambulatory Visit (INDEPENDENT_AMBULATORY_CARE_PROVIDER_SITE_OTHER): Payer: Medicare Other | Admitting: Family Medicine

## 2016-07-20 ENCOUNTER — Encounter: Payer: Self-pay | Admitting: Family Medicine

## 2016-07-20 VITALS — BP 130/90 | Temp 97.7°F | Wt 189.4 lb

## 2016-07-20 DIAGNOSIS — M50322 Other cervical disc degeneration at C5-C6 level: Secondary | ICD-10-CM | POA: Insufficient documentation

## 2016-07-20 DIAGNOSIS — M542 Cervicalgia: Secondary | ICD-10-CM

## 2016-07-20 NOTE — Patient Instructions (Signed)
Muscle Pain, Adult Muscle pain (myalgia) may be mild or severe. In most cases, the pain lasts only a short time and it goes away without treatment. It is normal to feel some muscle pain after starting a workout program. Muscles that have not been used often will be sore at first. Muscle pain may also be caused by many other things, including:  Overuse or muscle strain, especially if you are not in shape. This is the most common cause of muscle pain.  Injury.  Bruises.  Viruses, such as the flu.  Infectious diseases.  A chronic condition that causes muscle tenderness, fatigue, and headache (fibromyalgia).  A condition, such as lupus, in which the body's disease-fighting system attacks other organs in the body (autoimmune or rheumatologic diseases).  Certain drugs, including ACE inhibitors and statins. To diagnose the cause of your muscle pain, your health care provider will do a physical exam and ask questions about the pain and when it began. If you have not had muscle pain for very long, your health care provider may want to wait before doing much testing. If your muscle pain has lasted a long time, your health care provider may want to run tests right away. In some cases, this may include tests to rule out certain conditions or illnesses. Treatment for muscle pain depends on the cause. Home care is often enough to relieve muscle pain. Your health care provider may also prescribe anti-inflammatory medicine. Follow these instructions at home: Activity   If overuse is causing your muscle pain:  Slow down your activities until the pain goes away.  Do regular, gentle exercises if you are not usually active.  Warm up before exercising. Stretch before and after exercising. This can help lower the risk of muscle pain.  Do not continue working out if the pain is very bad. Bad pain could mean that you have injured a muscle. Managing pain and discomfort    If directed, apply ice to the  sore muscle:  Put ice in a plastic bag.  Place a towel between your skin and the bag.  Leave the ice on for 20 minutes, 2-3 times a day.  You may also alternate between applying ice and applying heat as told by your health care provider. To apply heat, use the heat source that your health care provider recommends, such as a moist heat pack or a heating pad.  Place a towel between your skin and the heat source.  Leave the heat on for 20-30 minutes.  Remove the heat if your skin turns bright red. This is especially important if you are unable to feel pain, heat, or cold. You may have a greater risk of getting burned. Medicines   Take over-the-counter and prescription medicines only as told by your health care provider.  Do not drive or use heavy machinery while taking prescription pain medicine. Contact a health care provider if:  Your muscle pain gets worse and medicines do not help.  You have muscle pain that lasts longer than 3 days.  You have a rash or fever along with muscle pain.  You have muscle pain after a tick bite.  You have muscle pain while working out, even though you are in good physical condition.  You have redness, soreness, or swelling along with muscle pain.  You have muscle pain after starting a new medicine or changing the dose of a medicine. Get help right away if:  You have trouble breathing.  You have trouble swallowing.  You have muscle pain along with a stiff neck, fever, and vomiting.  You have severe muscle weakness or cannot move part of your body. This information is not intended to replace advice given to you by your health care provider. Make sure you discuss any questions you have with your health care provider. Document Released: 02/18/2006 Document Revised: 10/17/2015 Document Reviewed: 08/19/2015 Elsevier Interactive Patient Education  2017 [employer].

## 2016-07-20 NOTE — Progress Notes (Signed)
Patient: Jerry Mcpherson Male    DOB: [birth year]   76 y.o.   MRN: [id number] Visit Date: 07/20/2016  Today's Provider: Vernie Murders, PA   Chief Complaint  Patient presents with  . Neck Pain   Subjective:    Neck Pain   This is a new problem. Episode onset: Saturday. The problem occurs constantly. The problem has been gradually worsening. The pain is associated with nothing. The pain is present in the right side and left side (started on right side and now on the left side). Quality: sharp. The pain is at a severity of 9/10. Exacerbated by: movement. The pain is same all the time. Stiffness is present all day. He has tried ice and heat for the symptoms. The treatment provided mild relief.   Past Medical History:  Diagnosis Date  . Genital herpes   . Hyperglycemia   . Hypertension   . Squamous cell carcinoma    Past Surgical History:  Procedure Laterality Date  . CATARACT EXTRACTION Left 10/2012   Dr. Murvin Natal; [facility]  . EYE SURGERY Left    removed a piece of steel  . HERNIA REPAIR  15/40/0867   umbilical hernia, incarderated; Dr. Bary Castilla  . ROTATOR CUFF REPAIR    . SQUAMOUS CELL CARCINOMA EXCISION  2008  . TONSILLECTOMY     Family History  Problem Relation Age of Onset  . Hypertension Mother   . Heart attack Mother   . Stroke Father   . Cancer Father   . COPD Sister   . Congestive Heart Failure Brother    Allergies  Allergen Reactions  . Codeine     Heart racing  . Cozaar [Losartan Potassium]     angioedema  . Cyclobenzaprine     Musculoskeletal therapy agents cause Excessive sedation  . Ultram  [Tramadol Hcl] Nausea And Vomiting  . Celebrex  [Celecoxib] Itching, Rash and Swelling     Previous Medications   ACETAMINOPHEN (TYLENOL) 500 MG TABLET    Take 2 tablets (1,000 mg total) by mouth every 6 (six) hours as needed for mild pain or moderate pain.   AMLODIPINE (NORVASC) 5 MG TABLET    TAKE 1 TABLET BY MOUTH EVERY DAY   ASCORBIC ACID  (VITAMIN C) 1000 MG TABLET    Take 2 tablets by mouth daily.   ASPIRIN 81 MG TABLET    Take 1 tablet by mouth daily.   DOCUSATE SODIUM (COLACE) 100 MG CAPSULE    Take 1 capsule by mouth daily as needed.   MULTIPLE VITAMINS-MINERALS (MULTIVITAMIN ADULT PO)    Take 1 tablet by mouth daily.   SILDENAFIL (VIAGRA) 50 MG TABLET    Take 0.5-1 tablets by mouth daily as needed.   VALACYCLOVIR (VALTREX) 500 MG TABLET    TAKE 1 TABLET BY MOUTH TWICE DAILY FOR 3 DAYS AS NEEDED    Review of Systems  Constitutional: Negative.   Respiratory: Negative.   Cardiovascular: Negative.   Musculoskeletal: Positive for neck pain.    Social History  Substance Use Topics  . Smoking status: Former Smoker    Packs/day: 3.00    Years: 15.00    Types: Cigarettes    Quit date: 04/12/1968  . Smokeless tobacco: Former Systems developer  . Alcohol use No   Objective:   BP 130/90 (BP Location: Right Arm, Patient Position: Sitting, Cuff Size: Normal)   Temp 97.7 F (36.5 C) (Oral)   Wt 189 lb 6.4 oz (85.9 kg)   SpO2 97%  BMI 25.69 kg/m   Physical Exam  Constitutional: He is oriented to person, place, and time. He appears well-developed and well-nourished. No distress.  HENT:  Head: Normocephalic and atraumatic.  Right Ear: Hearing normal.  Left Ear: Hearing normal.  Nose: Nose normal.  Eyes: Conjunctivae and lids are normal. Right eye exhibits no discharge. Left eye exhibits no discharge. No scleral icterus.  Neck: No thyromegaly present.  Acute pain and spasm in neck posteriorly. Unable to rotate, flex or extend without sharp pain.  Pulmonary/Chest: Effort normal. No respiratory distress.  Musculoskeletal: He exhibits tenderness.  Tender along left posterior cervical muscle up to occiput. Symmetric grip strength with increased pain to attempt to rotate head or flex/extend neck. No numbness.   Lymphadenopathy:    He has no cervical adenopathy.  Neurological: He is alert and oriented to person, place, and time. He has  normal reflexes.  Skin: Skin is intact. No lesion and no rash noted.  Psychiatric: He has a normal mood and affect. His speech is normal and behavior is normal. Thought content normal.      Assessment & Plan:      1. Acute neck pain Onset the past few days after working on a garbage disposal under a sink on 07-16-16. Will get x-ray to rule out arthritic disease versus only muscle spasm and strain. Apply moist heat prn. Does not want to use any muscle relaxants or narcotic pain medications for fear of oversedation or GI upset. May use Tylenol-PM for spasm and Advil 200 mg 4 tablets TID with food. Recommend using a soft cervical collar for support and limiting any lifting or reaching overhead. Recheck pending x-ray report. - DG Cervical Spine Complete

## 2016-07-20 NOTE — Telephone Encounter (Signed)
Patient advised. He will call back if needed sooner than 7-10 days for follow up.

## 2016-07-20 NOTE — Telephone Encounter (Signed)
-----   Message from [place], [place] sent at 07/20/2016  1:18 PM EDT ----- Straightening of the normal alignment indicates muscle spasm and degenerative disc disease at C4-5 and C5-6 level. No broken bones. Use collar and Advil with Tylenol-PM for spasm and inflammation. Recheck neck in 7-10 days (sooner if needed). Also, found a [employer] cap under a chair in the exam room. Could it be yours?

## 2016-07-21 ENCOUNTER — Telehealth: Payer: Self-pay | Admitting: Family Medicine

## 2016-07-21 MED ORDER — HYDROCODONE-ACETAMINOPHEN 5-325 MG PO TABS: 1.0000 | ORAL_TABLET | Freq: Four times a day (QID) | ORAL | 0 refills | Status: DC | PRN

## 2016-07-21 NOTE — Telephone Encounter (Signed)
Patient reports that he does not want a muscle relaxer due to it causing upset stomach. Patient reports that he has taken Hydrocodone in the past and it helped, but he does not like the way it makes him feel. Patient is requesting a small dose of hydrocodone to help with the pain due to the Tylenol and Advil not helping.

## 2016-07-21 NOTE — Telephone Encounter (Signed)
Pt states he seen Simona Huh yesterday for neck pain.  Pt states Simona Huh advised him to take over the counter pain medication but this is not helping with the pain.  Pt is requesting a Rx for pain if possible.  [facility] in [place].  CB#[phone]/[id number]

## 2016-08-01 IMAGING — CR DG LUMBAR SPINE 2-3V
1 series · 3 of 3 positions shown · non-contrast
Comparison: None.

CLINICAL DATA: Radicular symptoms extending into right foot

EXAM:
LUMBAR SPINE - 2-3 VIEW

[Series 1: kdxr lumbar spine ap and lateral · 0.14mm/px · 3 of 3 slices shown]
[im 1/3]
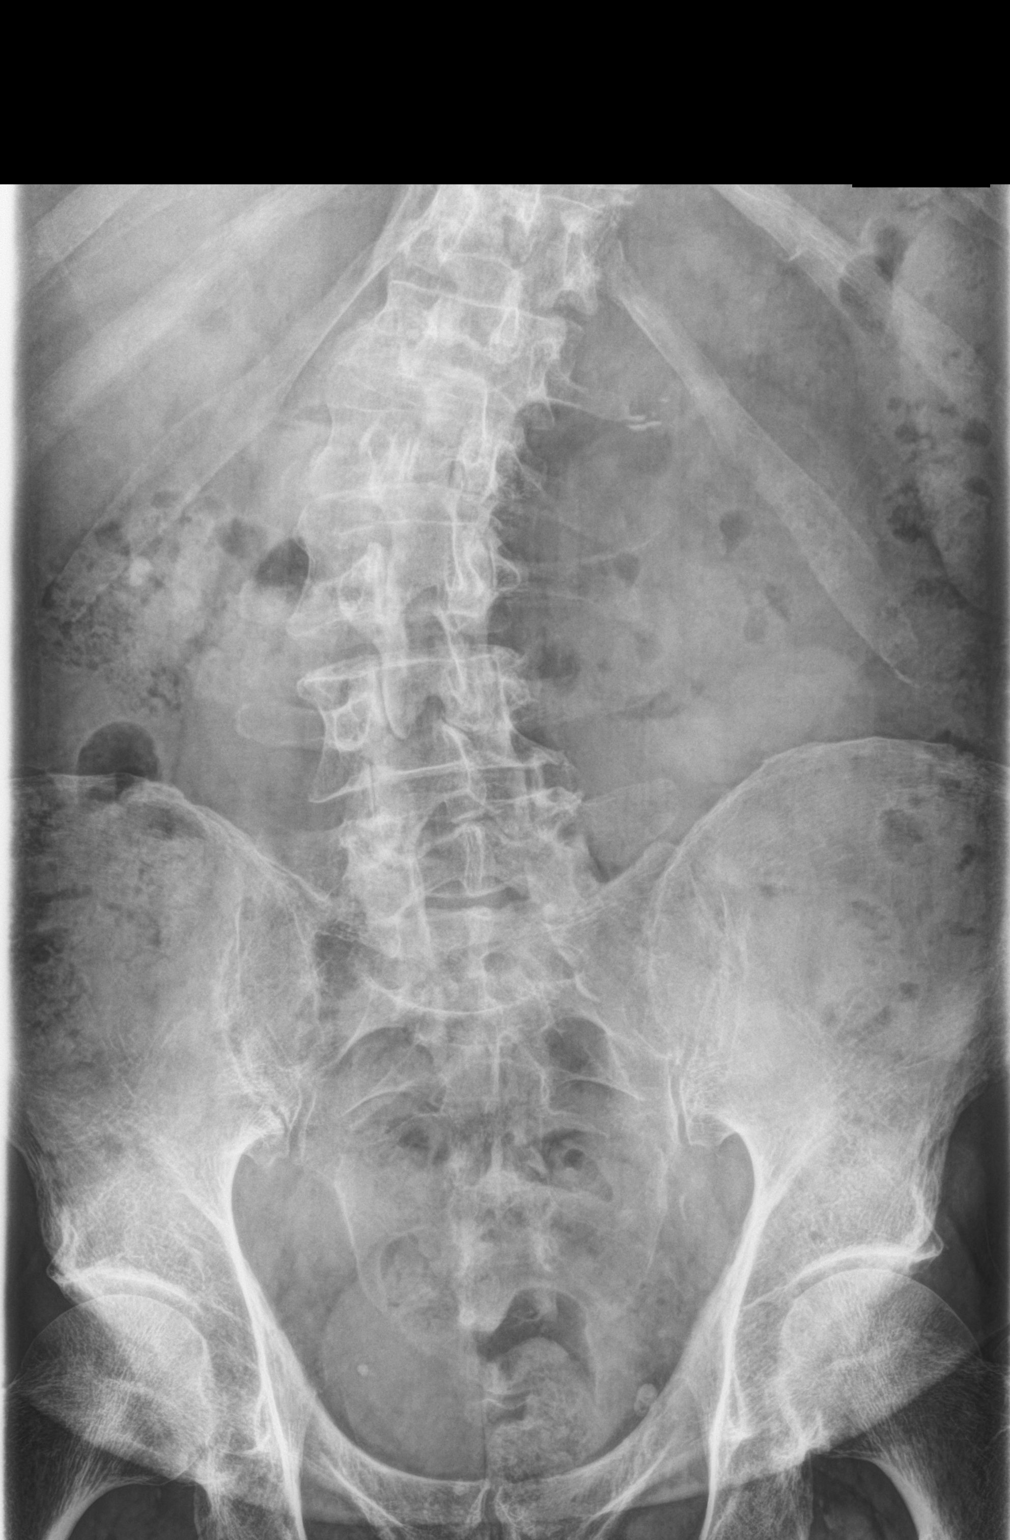
[im 2/3]
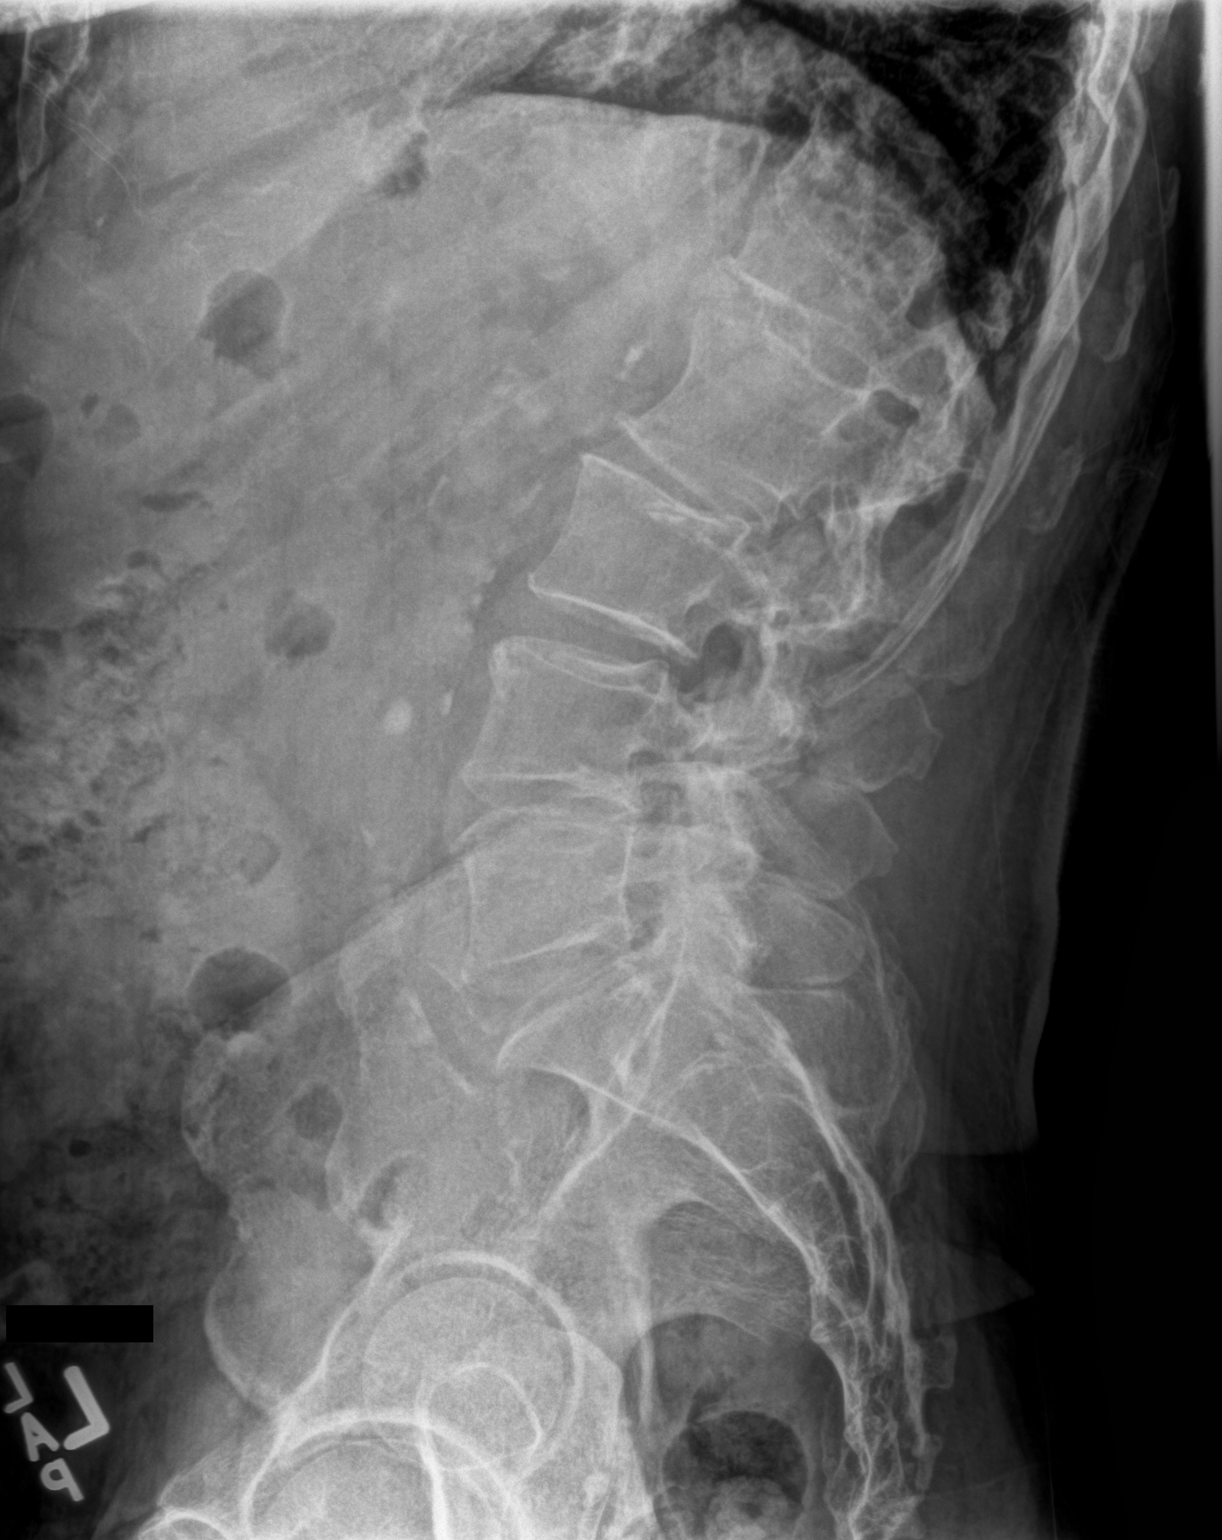
[im 3/3]
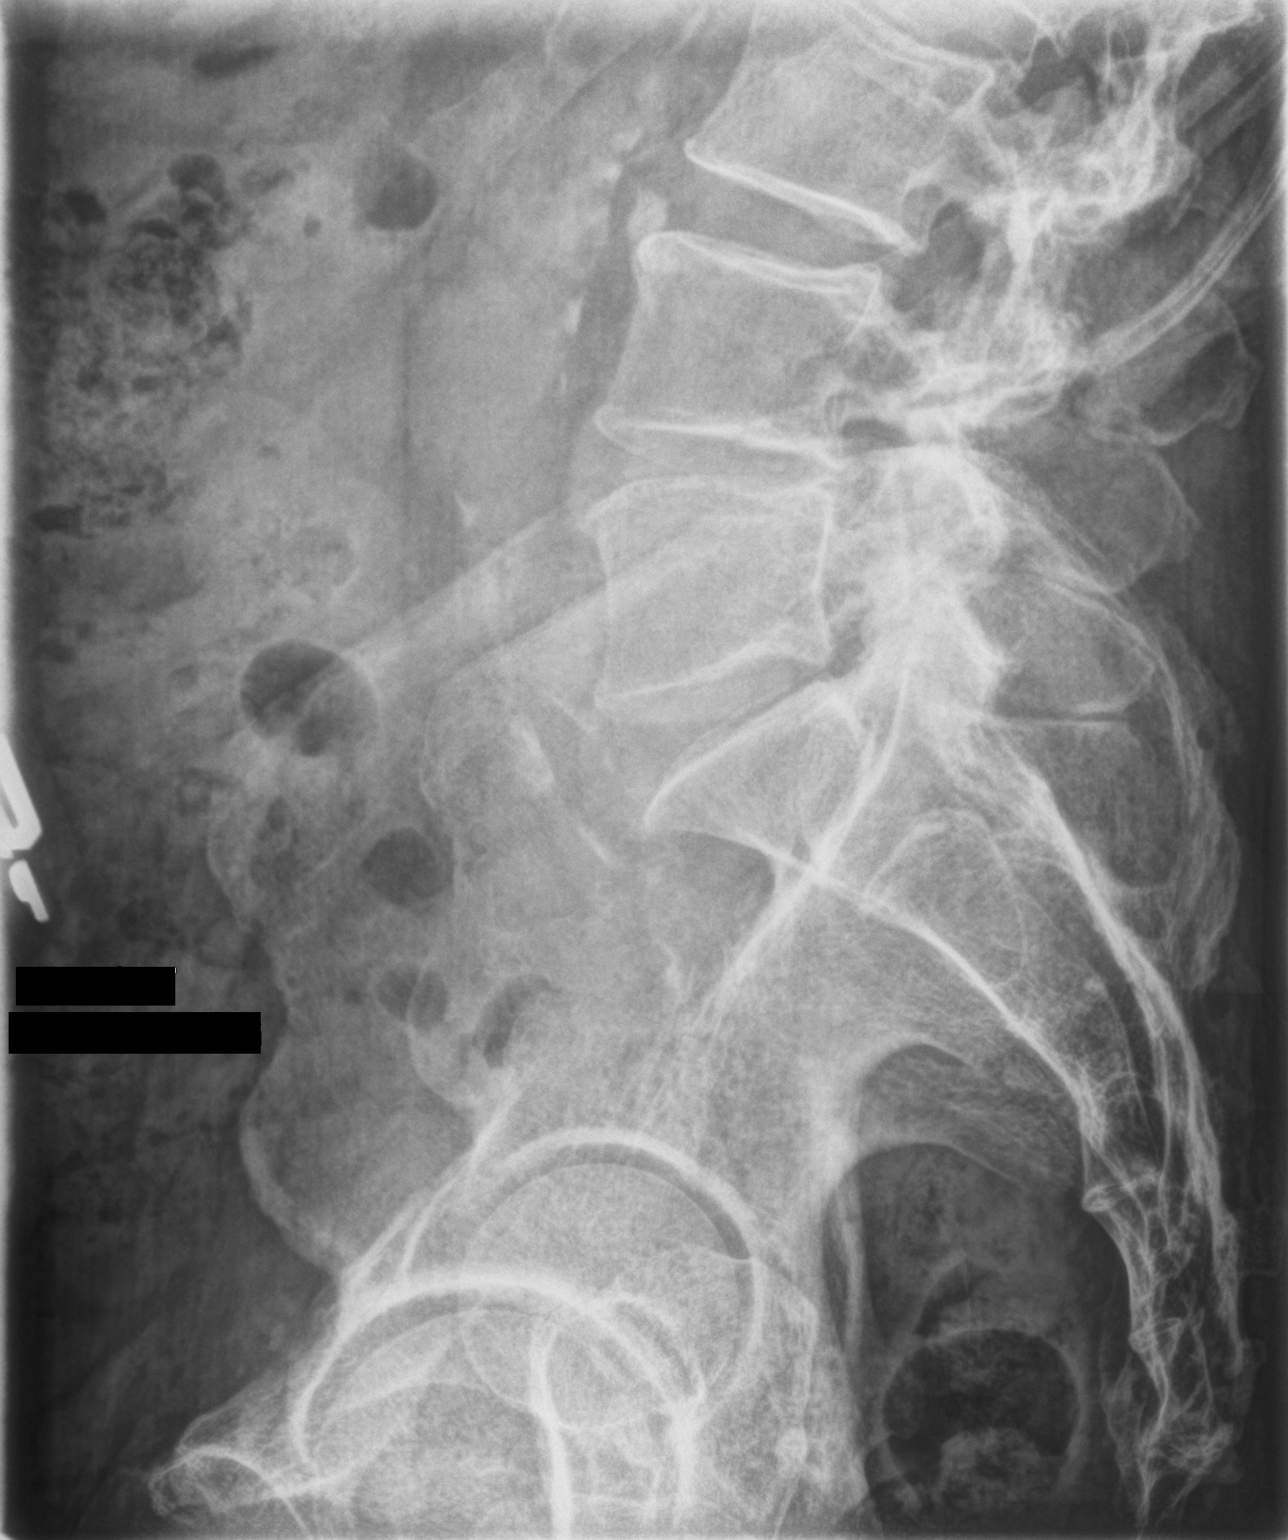

[3 of 3 positions shown; findings below may reference images not displayed]

FINDINGS: Frontal, lateral, and spot lumbosacral lateral images were obtained.
There are 5 non-rib-bearing lumbar type vertebral bodies. There is
thoracolumbar dextroscoliosis with a rotatory component. There is
remodeling at T12 and L1. No acute appearing fracture seen. No
spondylolisthesis. There is disc space narrowing at T12-L1, L1-2,
L2-3, and L4-5. No erosive change.
IMPRESSION: Scoliosis and multilevel osteoarthritic change. No acute fracture or
spondylolisthesis.

## 2016-08-08 IMAGING — CR RIGHT ANKLE - COMPLETE 3+ VIEW
1 series · 3 of 3 positions shown · non-contrast
Comparison: None.

CLINICAL DATA: Acute right ankle pain for 1 week without known
injury. Initial encounter.

EXAM:
RIGHT ANKLE - COMPLETE 3+ VIEW

[Series 1: kdxr ankle right complete · 0.14mm/px · 3 of 3 slices shown]
[im 1/3]
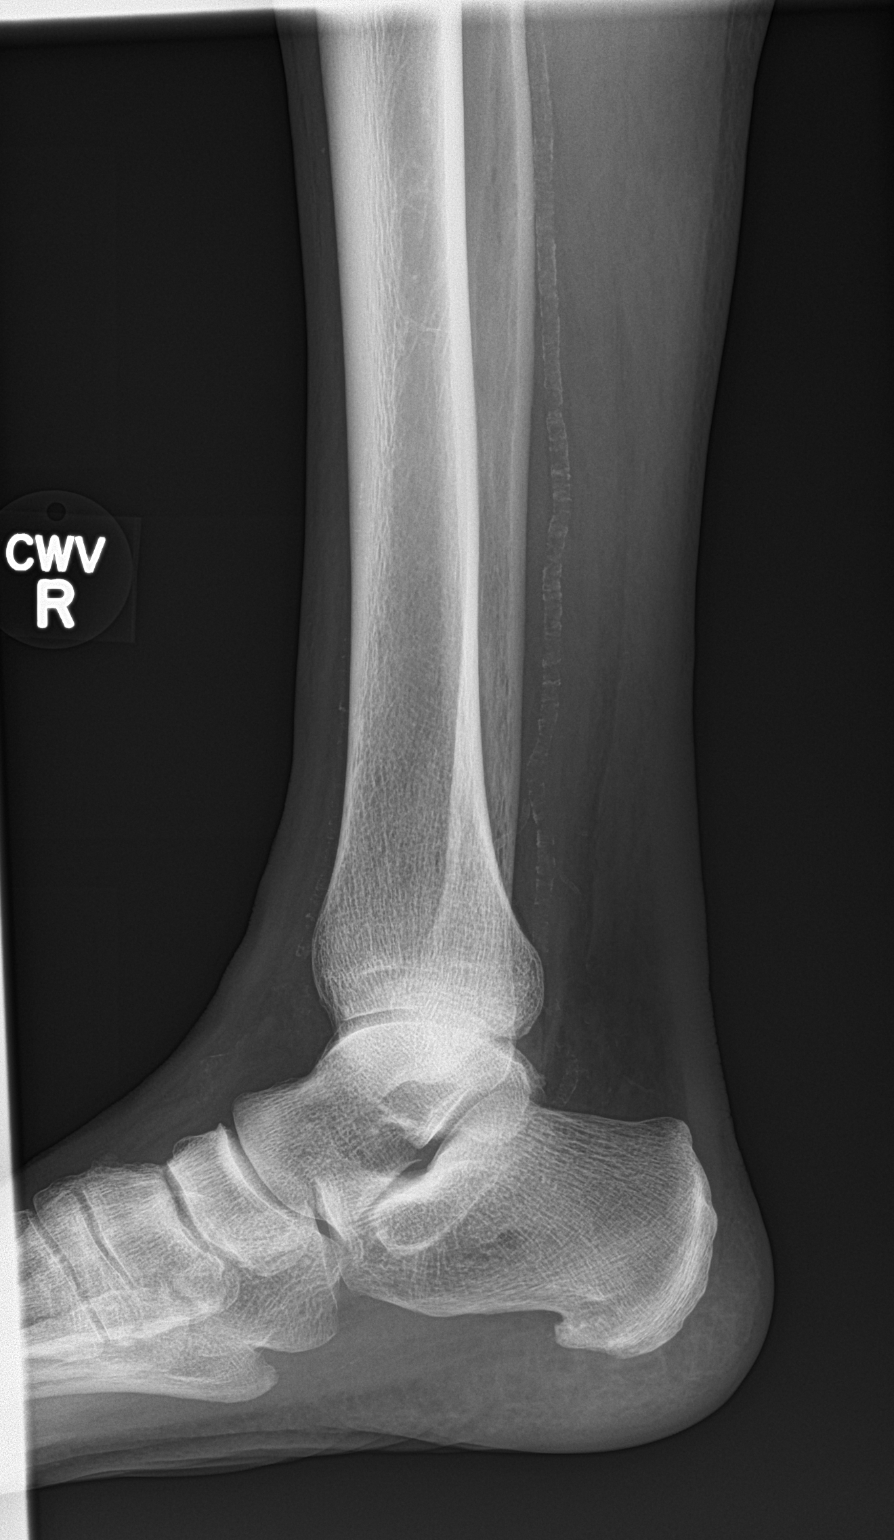
[im 2/3]
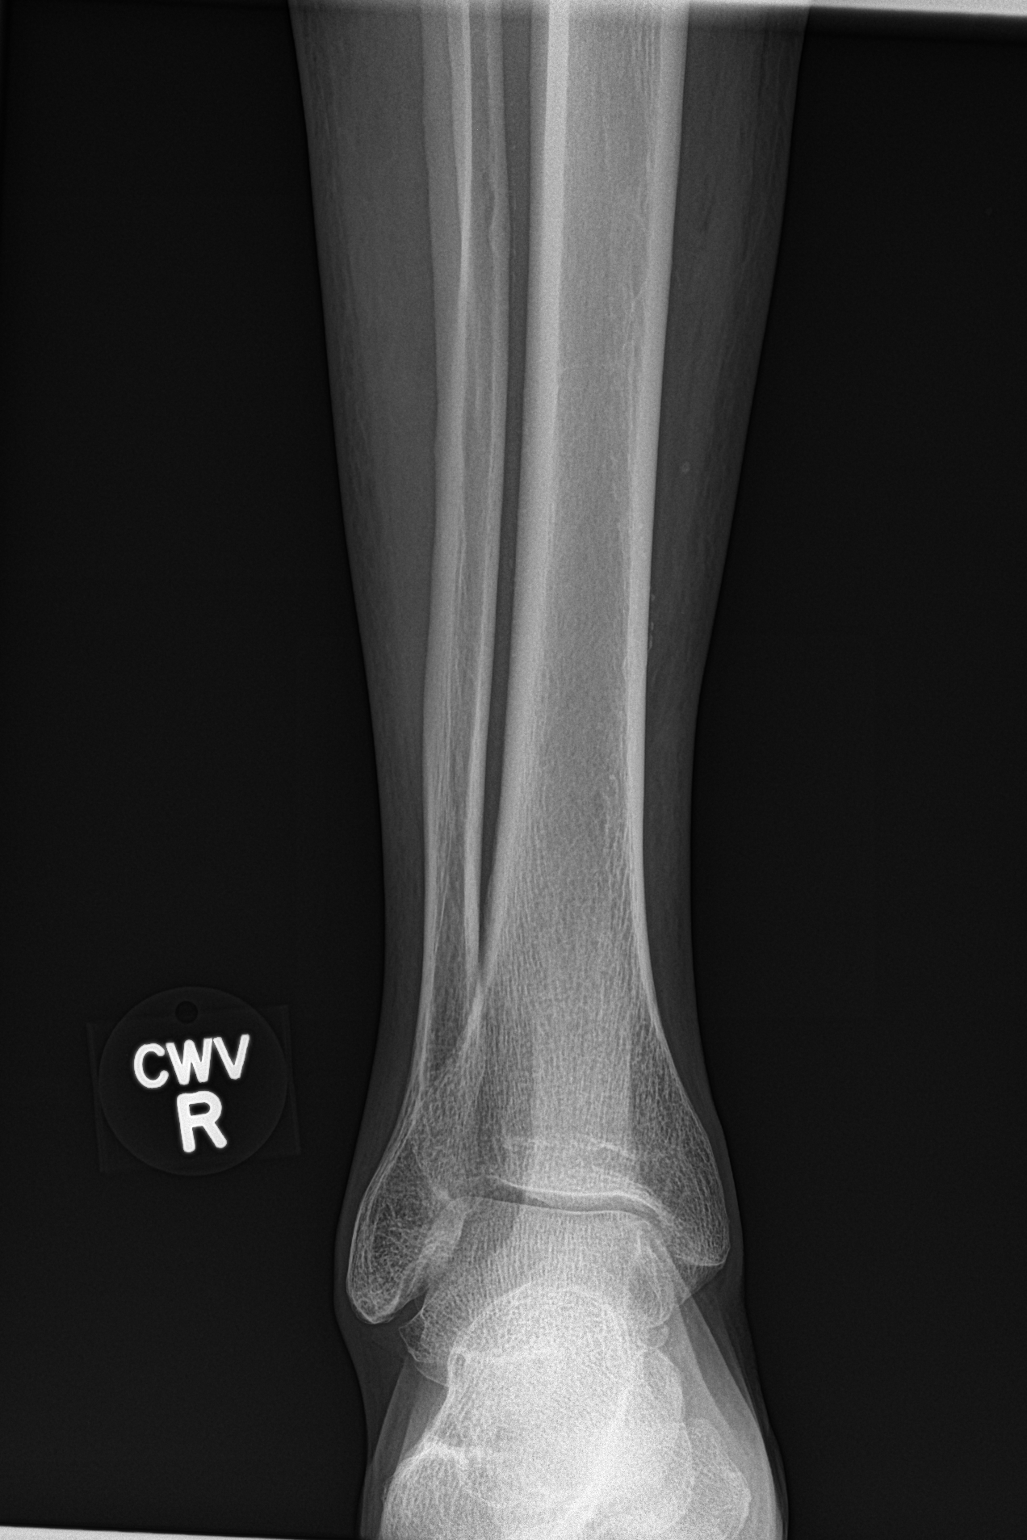
[im 3/3]
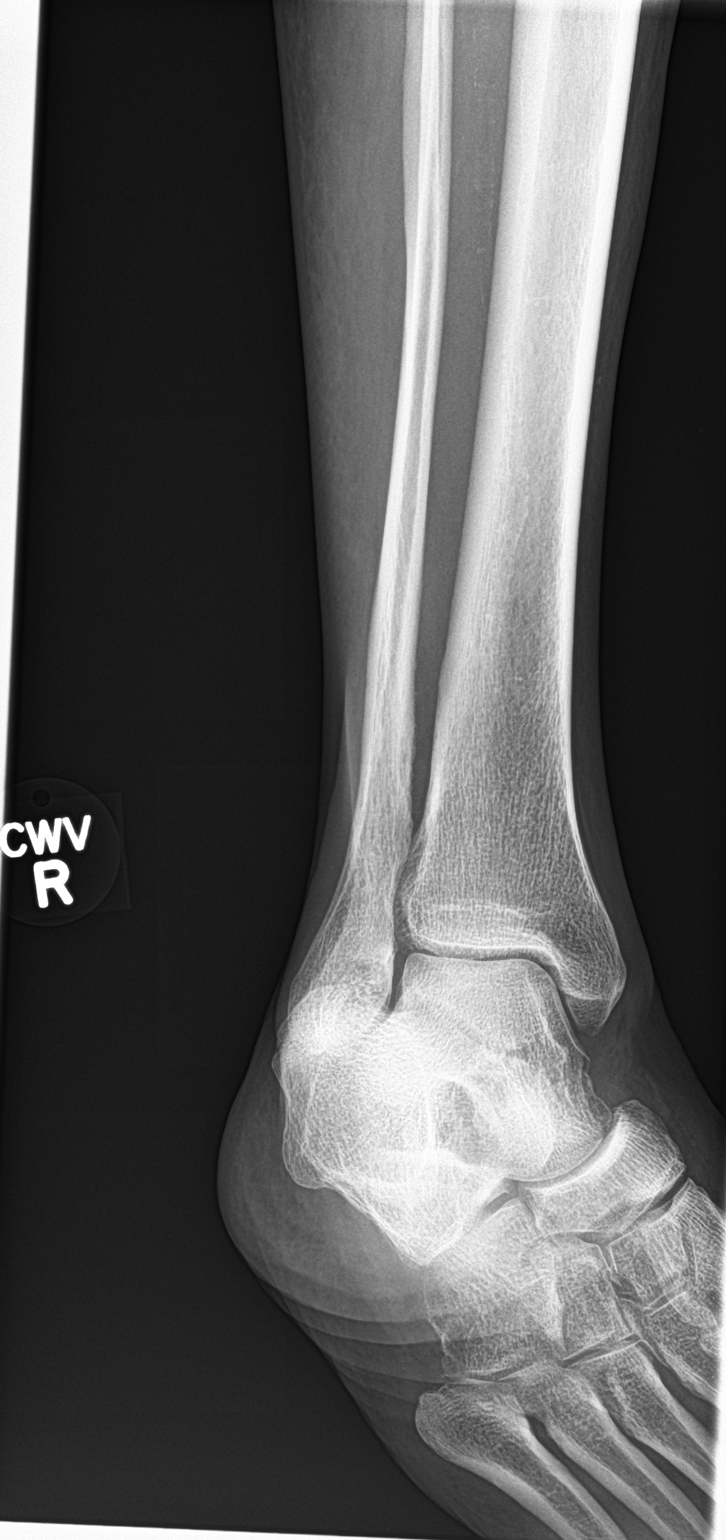

[3 of 3 positions shown; findings below may reference images not displayed]

FINDINGS: There is no evidence of fracture, dislocation, or joint effusion.
There is no evidence of arthropathy or other focal bone abnormality.
Vascular calcifications are noted.
IMPRESSION: Normal right ankle.

## 2016-08-24 ENCOUNTER — Ambulatory Visit (INDEPENDENT_AMBULATORY_CARE_PROVIDER_SITE_OTHER): Payer: Medicare Other | Admitting: Family Medicine

## 2016-08-24 ENCOUNTER — Encounter: Payer: Self-pay | Admitting: Family Medicine

## 2016-08-24 ENCOUNTER — Ambulatory Visit (INDEPENDENT_AMBULATORY_CARE_PROVIDER_SITE_OTHER): Payer: Medicare Other

## 2016-08-24 VITALS — BP 140/68 | HR 88 | Temp 97.8°F | Ht 72.0 in | Wt 189.4 lb

## 2016-08-24 DIAGNOSIS — Z0000 Encounter for general adult medical examination without abnormal findings: Secondary | ICD-10-CM | POA: Diagnosis not present

## 2016-08-24 DIAGNOSIS — M5030 Other cervical disc degeneration, unspecified cervical region: Secondary | ICD-10-CM | POA: Diagnosis not present

## 2016-08-24 MED ORDER — PREDNISONE 5 MG PO TABS: ORAL_TABLET | ORAL | 0 refills | Status: DC

## 2016-08-24 NOTE — Patient Instructions (Addendum)
Jerry Mcpherson , Thank you for taking time to come for your Medicare Wellness Visit. I appreciate your ongoing commitment to your health goals. Please review the following plan we discussed and let me know if I can assist you in the future.   Screening recommendations/referrals: Colonoscopy: Completed 07/21/2010, due 07/2020 Recommended yearly ophthalmology/optometry visit for glaucoma screening and checkup Recommended yearly dental visit for hygiene and checkup  Vaccinations: Influenza vaccine: up to date, due 01/2017 Pneumococcal vaccine: completed series Tdap vaccine: up to date Shingles vaccine: up to date   Advanced directives: Advance directive discussed with you today. I have provided a copy for you to complete at home and have notarized. Once this is complete please bring a copy in to our office so we can scan it into your chart.  Conditions/risks identified: Recommend eating three healthy meals a day.   Next appointment: none, follow up in one year for annual wellness visit.   Preventive Care 78 Years and Older, Male Preventive care refers to lifestyle choices and visits with your health care provider that can promote health and wellness. What does preventive care include?  A yearly physical exam. This is also called an annual well check.  Dental exams once or twice a year.  Routine eye exams. Ask your health care provider how often you should have your eyes checked.  Personal lifestyle choices, including:  Daily care of your teeth and gums.  Regular physical activity.  Eating a healthy diet.  Avoiding tobacco and drug use.  Limiting alcohol use.  Practicing safe sex.  Taking low doses of aspirin every day.  Taking vitamin and mineral supplements as recommended by your health care provider. What happens during an annual well check? The services and screenings done by your health care provider during your annual well check will depend on your age, overall health,  lifestyle risk factors, and family history of disease. Counseling  Your health care provider may ask you questions about your:  Alcohol use.  Tobacco use.  Drug use.  Emotional well-being.  Home and relationship well-being.  Sexual activity.  Eating habits.  History of falls.  Memory and ability to understand (cognition).  Work and work Statistician. Screening  You may have the following tests or measurements:  Height, weight, and BMI.  Blood pressure.  Lipid and cholesterol levels. These may be checked every 5 years, or more frequently if you are over 67 years old.  Skin check.  Lung cancer screening. You may have this screening every year starting at age 55 if you have a 30-pack-year history of smoking and currently smoke or have quit within the past 15 years.  Fecal occult blood test (FOBT) of the stool. You may have this test every year starting at age 80.  Flexible sigmoidoscopy or colonoscopy. You may have a sigmoidoscopy every 5 years or a colonoscopy every 10 years starting at age 78.  Prostate cancer screening. Recommendations will vary depending on your family history and other risks.  Hepatitis C blood test.  Hepatitis B blood test.  Sexually transmitted disease (STD) testing.  Diabetes screening. This is done by checking your blood sugar (glucose) after you have not eaten for a while (fasting). You may have this done every 1-3 years.  Abdominal aortic aneurysm (AAA) screening. You may need this if you are a current or former smoker.  Osteoporosis. You may be screened starting at age 25 if you are at high risk. Talk with your health care provider about your test  results, treatment options, and if necessary, the need for more tests. Vaccines  Your health care provider may recommend certain vaccines, such as:  Influenza vaccine. This is recommended every year.  Tetanus, diphtheria, and acellular pertussis (Tdap, Td) vaccine. You may need a Td booster  every 10 years.  Zoster vaccine. You may need this after age 78.  Pneumococcal 13-valent conjugate (PCV13) vaccine. One dose is recommended after age 65.  Pneumococcal polysaccharide (PPSV23) vaccine. One dose is recommended after age 30. Talk to your health care provider about which screenings and vaccines you need and how often you need them. This information is not intended to replace advice given to you by your health care provider. Make sure you discuss any questions you have with your health care provider. Document Released: 04/25/2015 Document Revised: 12/17/2015 Document Reviewed: 01/28/2015 Elsevier Interactive Patient Education  2017 [place] Prevention in the Home Falls can cause injuries. They can happen to people of all ages. There are many things you can do to make your home safe and to help prevent falls. What can I do on the outside of my home?  Regularly fix the edges of walkways and driveways and fix any cracks.  Remove anything that might make you trip as you walk through a door, such as a raised step or threshold.  Trim any bushes or trees on the path to your home.  Use bright outdoor lighting.  Clear any walking paths of anything that might make someone trip, such as rocks or tools.  Regularly check to see if handrails are loose or broken. Make sure that both sides of any steps have handrails.  Any raised decks and porches should have guardrails on the edges.  Have any leaves, snow, or ice cleared regularly.  Use sand or salt on walking paths during winter.  Clean up any spills in your garage right away. This includes oil or grease spills. What can I do in the bathroom?  Use night lights.  Install grab bars by the toilet and in the tub and shower. Do not use towel bars as grab bars.  Use non-skid mats or decals in the tub or shower.  If you need to sit down in the shower, use a plastic, non-slip stool.  Keep the floor dry. Clean up any  water that spills on the floor as soon as it happens.  Remove soap buildup in the tub or shower regularly.  Attach bath mats securely with double-sided non-slip rug tape.  Do not have throw rugs and other things on the floor that can make you trip. What can I do in the bedroom?  Use night lights.  Make sure that you have a light by your bed that is easy to reach.  Do not use any sheets or blankets that are too big for your bed. They should not hang down onto the floor.  Have a firm chair that has side arms. You can use this for support while you get dressed.  Do not have throw rugs and other things on the floor that can make you trip. What can I do in the kitchen?  Clean up any spills right away.  Avoid walking on wet floors.  Keep items that you use a lot in easy-to-reach places.  If you need to reach something above you, use a strong step stool that has a grab bar.  Keep electrical cords out of the way.  Do not use floor polish or wax that makes  floors slippery. If you must use wax, use non-skid floor wax.  Do not have throw rugs and other things on the floor that can make you trip. What can I do with my stairs?  Do not leave any items on the stairs.  Make sure that there are handrails on both sides of the stairs and use them. Fix handrails that are broken or loose. Make sure that handrails are as long as the stairways.  Check any carpeting to make sure that it is firmly attached to the stairs. Fix any carpet that is loose or worn.  Avoid having throw rugs at the top or bottom of the stairs. If you do have throw rugs, attach them to the floor with carpet tape.  Make sure that you have a light switch at the top of the stairs and the bottom of the stairs. If you do not have them, ask someone to add them for you. What else can I do to help prevent falls?  Wear shoes that:  Do not have high heels.  Have rubber bottoms.  Are comfortable and fit you well.  Are closed  at the toe. Do not wear sandals.  If you use a stepladder:  Make sure that it is fully opened. Do not climb a closed stepladder.  Make sure that both sides of the stepladder are locked into place.  Ask someone to hold it for you, if possible.  Clearly mark and make sure that you can see:  Any grab bars or handrails.  First and last steps.  Where the edge of each step is.  Use tools that help you move around (mobility aids) if they are needed. These include:  Canes.  Walkers.  Scooters.  Crutches.  Turn on the lights when you go into a dark area. Replace any light bulbs as soon as they burn out.  Set up your furniture so you have a clear path. Avoid moving your furniture around.  If any of your floors are uneven, fix them.  If there are any pets around you, be aware of where they are.  Review your medicines with your doctor. Some medicines can make you feel dizzy. This can increase your chance of falling. Ask your doctor what other things that you can do to help prevent falls. This information is not intended to replace advice given to you by your health care provider. Make sure you discuss any questions you have with your health care provider. Document Released: 01/23/2009 Document Revised: 09/04/2015 Document Reviewed: 05/03/2014 Elsevier Interactive Patient Education  2017 [employer].

## 2016-08-24 NOTE — Progress Notes (Signed)
Subjective:   Jerry Mcpherson is a 76 y.o. male who presents for Medicare Annual/Subsequent preventive examination.  Review of Systems:   Cardiac Risk Factors include: advanced age (>8men, >93 women);hypertension     Objective:    Vitals: BP 140/68 (BP Location: Right Arm, Patient Position: Sitting)   Pulse 88   Temp 97.8 F (36.6 C)   Ht 6' (1.829 m)   Wt 189 lb 6.4 oz (85.9 kg)   BMI 25.69 kg/m   Body mass index is 25.69 kg/m.  Tobacco History  Smoking Status  . Former Smoker  . Packs/day: 3.00  . Years: 15.00  . Types: Cigarettes  . Quit date: 04/12/1968  Smokeless Tobacco  . Former Systems developer     Counseling given: Not Answered   Past Medical History:  Diagnosis Date  . Genital herpes   . Hyperglycemia   . Hypertension   . Squamous cell carcinoma    Past Surgical History:  Procedure Laterality Date  . CATARACT EXTRACTION Left 10/2012   Dr. Murvin Natal; [place] [facility]  . EYE SURGERY Left    removed a piece of steel  . HERNIA REPAIR  28/78/6767   umbilical hernia, incarderated; Dr. Bary Castilla  . ROTATOR CUFF REPAIR    . SQUAMOUS CELL CARCINOMA EXCISION  2008  . TONSILLECTOMY     Family History  Problem Relation Age of Onset  . Hypertension Mother   . Heart attack Mother   . Stroke Father   . Cancer Father   . COPD Sister   . Congestive Heart Failure Brother    History  Sexual Activity  . Sexual activity: Not on file    Outpatient Encounter Prescriptions as of 08/24/2016  Medication Sig  . acetaminophen (TYLENOL) 500 MG tablet Take 2 tablets (1,000 mg total) by mouth every 6 (six) hours as needed for mild pain or moderate pain.  Marland Kitchen amLODipine (NORVASC) 5 MG tablet TAKE 1 TABLET BY MOUTH EVERY DAY  . Ascorbic Acid (VITAMIN C) 1000 MG tablet Take 2 tablets by mouth daily.  Marland Kitchen aspirin 81 MG tablet Take 1 tablet by mouth daily.  Marland Kitchen docusate sodium (COLACE) 100 MG capsule Take 1 capsule by mouth daily as needed.  Marland Kitchen HYDROcodone-acetaminophen  (NORCO/VICODIN) 5-325 MG tablet Take 1 tablet by mouth every 6 (six) hours as needed for moderate pain.  . Multiple Vitamins-Minerals (MULTIVITAMIN ADULT PO) Take 1 tablet by mouth daily.  . sildenafil (VIAGRA) 50 MG tablet Take 0.5-1 tablets by mouth daily as needed.  Marland Kitchen UNABLE TO FIND Med Name: tumeric 500mg  twice a day  . valACYclovir (VALTREX) 500 MG tablet TAKE 1 TABLET BY MOUTH TWICE DAILY FOR 3 DAYS AS NEEDED   No facility-administered encounter medications on file as of 08/24/2016.     Activities of Daily Living In your present state of health, do you have any difficulty performing the following activities: 08/24/2016  Hearing? Y  Vision? Y  Difficulty concentrating or making decisions? N  Walking or climbing stairs? Y  Dressing or bathing? N  Doing errands, shopping? N  Preparing Food and eating ? N  Using the Toilet? N  In the past six months, have you accidently leaked urine? N  Do you have problems with loss of bowel control? N  Managing your Medications? N  Managing your Finances? N  Housekeeping or managing your Housekeeping? N  Some recent data might be hidden    Patient Care Team: Birdie Sons, MD as PCP - General (Family Medicine)  Assessment:     Exercise Activities and Dietary recommendations Current Exercise Habits: The patient has a physically strenous job, but has no regular exercise apart from work.  Goals    . Have 3 meals a day          Recommend eating three healthy meals a day.       Fall Risk Fall Risk  08/24/2016 01/28/2016 12/12/2014  Falls in the past year? No No No   Depression Screen PHQ 2/9 Scores 08/24/2016 08/24/2016 01/28/2016 12/12/2014  PHQ - 2 Score 0 0 0 0  PHQ- 9 Score 0 - - -    Cognitive Function     6CIT Screen 08/24/2016  What Year? 0 points  What month? 0 points  What time? 0 points  Count back from 20 0 points  Months in reverse 0 points  Repeat phrase 2 points  Total Score 2    Immunization History    Administered Date(s) Administered  . Influenza Split 01/01/2009  . Influenza, High Dose Seasonal PF 12/31/2013, 12/12/2014, 01/28/2016  . Pneumococcal Conjugate-13 12/31/2013  . Pneumococcal Polysaccharide-23 02/22/2004  . Tdap 10/28/2011  . Zoster 01/28/2009   Screening Tests Health Maintenance  Topic Date Due  . INFLUENZA VACCINE  11/10/2016  . TETANUS/TDAP  10/27/2021  . PNA vac Low Risk Adult  Completed      Plan:  I have personally reviewed and addressed the Medicare Annual Wellness questionnaire and have noted the following in the patient's chart:  A. Medical and social history B. Use of alcohol, tobacco or illicit drugs  C. Current medications and supplements D. Functional ability and status E.  Nutritional status F.  Physical activity G. Advance directives H. List of other physicians I.  Hospitalizations, surgeries, and ER visits in previous 12 months J.  Ferdinand such as hearing and vision if needed, cognitive and depression L. Referrals and appointments - none  In addition, I have reviewed and discussed with patient certain preventive protocols, quality metrics, and best practice recommendations. A written personalized care plan for preventive services as well as general preventive health recommendations were provided to patient.  See attached scanned questionnaire for additional information.   Signed,  Tyler Aas, LPN Nurse Health Advisor   MD Recommendations: none   I have reviewed the health advisor's note, was available for consultation, and agree with documentation and plan  Lelon Huh, MD

## 2016-08-24 NOTE — Progress Notes (Signed)
Patient: Jerry Mcpherson Male    DOB: [birth year]   76 y.o.   MRN: [id number] Visit Date: 08/24/2016  Today's Provider: Vernie Murders, PA   No chief complaint on file.  Subjective:    HPI     Follow up for Neck Pain  The patient was last seen for this 5 weeks ago. Changes made at last visit include ordering C-Spine Xray, which indicated muscle spasm and DDD of C4-C5 and C5-C6. Pt was advised to apply moist heat PRN, take APAP and IBU, and use soft cervical collar.  He reports good compliance with treatment. He feels that condition is Improved.Reports he is able to move his neck a little bit more. He still has neck stiffness present, specially at night. He reports he is using two pillows. He is not having side effects.  Treatments: Tylenol,Advil daily and NORCO prn.  ------------------------------------------------------------------------------------  Patient Active Problem List   Diagnosis Date Noted  . Erectile dysfunction 12/10/2014  . Genital HSV 12/10/2014  . Hypertension 12/10/2014  . Leg cramps 12/10/2014  . Leg pain 12/10/2014  . Neuralgia 12/10/2014  . Pinched nerve 12/10/2014  . Radiculopathy 12/10/2014  . Squamous cell carcinoma 12/10/2014  . Vertigo 12/10/2014  . Hyperglycemia 01/03/2009   Past Surgical History:  Procedure Laterality Date  . CATARACT EXTRACTION Left 10/2012   Dr. Murvin Natal; [place] [facility]  . EYE SURGERY Left    removed a piece of steel  . HERNIA REPAIR  53/29/9242   umbilical hernia, incarderated; Dr. Bary Castilla  . ROTATOR CUFF REPAIR    . SQUAMOUS CELL CARCINOMA EXCISION  2008  . TONSILLECTOMY     Family History  Problem Relation Age of Onset  . Hypertension Mother   . Heart attack Mother   . Stroke Father   . Cancer Father   . COPD Sister   . Congestive Heart Failure Brother     Current Outpatient Prescriptions:  .  acetaminophen (TYLENOL) 500 MG tablet, Take 2 tablets (1,000 mg total) by mouth every 6 (six)  hours as needed for mild pain or moderate pain., Disp: 100 tablet, Rfl: 0 .  amLODipine (NORVASC) 5 MG tablet, TAKE 1 TABLET BY MOUTH EVERY DAY, Disp: 30 tablet, Rfl: 12 .  Ascorbic Acid (VITAMIN C) 1000 MG tablet, Take 2 tablets by mouth daily., Disp: , Rfl:  .  aspirin 81 MG tablet, Take 1 tablet by mouth daily., Disp: , Rfl:  .  docusate sodium (COLACE) 100 MG capsule, Take 1 capsule by mouth daily as needed., Disp: , Rfl:  .  HYDROcodone-acetaminophen (NORCO/VICODIN) 5-325 MG tablet, Take 1 tablet by mouth every 6 (six) hours as needed for moderate pain., Disp: 30 tablet, Rfl: 0 .  Multiple Vitamins-Minerals (MULTIVITAMIN ADULT PO), Take 1 tablet by mouth daily., Disp: , Rfl:  .  sildenafil (VIAGRA) 50 MG tablet, Take 0.5-1 tablets by mouth daily as needed., Disp: , Rfl:  .  UNABLE TO FIND, Med Name: tumeric 500mg  twice a day, Disp: , Rfl:  .  valACYclovir (VALTREX) 500 MG tablet, TAKE 1 TABLET BY MOUTH TWICE DAILY FOR 3 DAYS AS NEEDED, Disp: 6 tablet, Rfl: 3  Review of Systems  Constitutional: Negative.   HENT: Negative.   Eyes: Negative.   Respiratory: Negative.   Cardiovascular: Negative.   Gastrointestinal: Negative.   Endocrine: Negative.   Genitourinary: Negative.   Musculoskeletal: Positive for arthralgias, neck pain and neck stiffness.  Skin: Negative.   Allergic/Immunologic: Negative.   Neurological:  Negative.   Hematological: Negative.   Psychiatric/Behavioral: Negative.    Social History  Substance Use Topics  . Smoking status: Former Smoker    Packs/day: 3.00    Years: 15.00    Types: Cigarettes    Quit date: 04/12/1968  . Smokeless tobacco: Former Systems developer  . Alcohol use No   Objective:    Vitals: BP 140/68 (BP Location: Right Arm, Patient Position: Sitting)   Pulse 88   Temp 97.8 F (36.6 C)   Ht 6' (1.829 m)   Wt 189 lb 6.4 oz (85.9 kg)   BMI 25.69 kg/m   Body mass index is 25.69 kg/m.  Physical Exam  Constitutional: He is oriented to person, place, and  time. He appears well-developed and well-nourished. No distress.  HENT:  Head: Normocephalic and atraumatic.  Right Ear: Hearing normal.  Left Ear: Hearing normal.  Nose: Nose normal.  Eyes: Conjunctivae and lids are normal. Right eye exhibits no discharge. Left eye exhibits no discharge. No scleral icterus.  Neck: Muscular tenderness present. Decreased range of motion present. No thyromegaly present.  Stiffness in neck. Rotation to 45 degrees bilaterally, flexion to 40 degrees and extension to 30 degrees. Some tenderness in posterior musculature.  Cardiovascular: Normal rate and regular rhythm.   Pulmonary/Chest: Effort normal and breath sounds normal. No respiratory distress.  Abdominal: Soft.  Lymphadenopathy:    He has no cervical adenopathy.  Neurological: He is alert and oriented to person, place, and time. He has normal reflexes.  Skin: Skin is intact. No lesion and no rash noted.  Psychiatric: He has a normal mood and affect. His speech is normal and behavior is normal. Thought content normal.      Assessment & Plan:     1. DDD (degenerative disc disease), cervical X-rays on 07-20-16 showed DDD of C4-5 and C5-6 with foramen narrowing bilaterally at these same levels. Rarely using Norco for neck pain at bedtime. No weakness of arms and hands. Decreased ROM with stiffness of neck. Wants to use his soft cervical collar prn and Tumeric TID for pain. Add a prednisone taper for inflammation and recheck in 5-6 weeks if needed. May need physical therapy referral. - predniSONE (DELTASONE) 5 MG tablet; Take a tapering dose starting with 6 tablets by mouth the first day. Decrease by one tablet daily (6,5,4,3,2,1).  Dispense: 21 tablet; Refill: [place], PA  [facility]

## 2016-09-08 ENCOUNTER — Encounter: Payer: Self-pay | Admitting: Family Medicine

## 2017-02-03 ENCOUNTER — Telehealth: Payer: Self-pay | Admitting: Family Medicine

## 2017-02-03 ENCOUNTER — Ambulatory Visit (INDEPENDENT_AMBULATORY_CARE_PROVIDER_SITE_OTHER): Payer: Medicare Other

## 2017-02-03 DIAGNOSIS — Z23 Encounter for immunization: Secondary | ICD-10-CM

## 2017-02-03 NOTE — Telephone Encounter (Signed)
Patient came in office wanting to know if he can get a prescription on Oxycodone for knee & neck pain. Patient thought he was coming in today to see Dr. Caryn Section and get his flu shot. I explained to pt he's appointment today was to only get flu shot.Pt was wanting to talk with Dr. Sandi Mariscal about his pain in neck & knee. Thanks CC     Patient's CB# [phone]

## 2017-02-04 ENCOUNTER — Encounter: Payer: Self-pay | Admitting: Family Medicine

## 2017-02-04 ENCOUNTER — Ambulatory Visit (INDEPENDENT_AMBULATORY_CARE_PROVIDER_SITE_OTHER): Payer: Medicare Other | Admitting: Family Medicine

## 2017-02-04 VITALS — BP 120/74 | HR 83 | Temp 97.8°F | Resp 16 | Ht 72.0 in | Wt 183.0 lb

## 2017-02-04 DIAGNOSIS — M170 Bilateral primary osteoarthritis of knee: Secondary | ICD-10-CM

## 2017-02-04 MED ORDER — OXYCODONE-ACETAMINOPHEN 7.5-325 MG PO TABS: 1.0000 | ORAL_TABLET | Freq: Three times a day (TID) | ORAL | 0 refills | Status: DC | PRN

## 2017-02-04 MED ORDER — DICLOFENAC SODIUM 1 % TD GEL: 4.0000 g | Freq: Four times a day (QID) | TRANSDERMAL | 5 refills | Status: DC

## 2017-02-04 NOTE — Telephone Encounter (Signed)
Patient seen in office 02/04/2017.

## 2017-02-04 NOTE — Progress Notes (Signed)
Patient: Jerry Mcpherson Male    DOB: Aug 22, 1940   76 y.o.   MRN: [id number] Visit Date: 02/04/2017  Today's Provider: Lelon Huh, MD   Chief Complaint  Patient presents with  . Knee Pain    x several years   Subjective:    Knee Pain   Incident onset: several years ago. Injury mechanism: knee injury several years ago. The pain is present in the left knee and right knee. The quality of the pain is described as aching. The pain is moderate. The pain has been intermittent since onset. Pertinent negatives include no numbness or tingling. Exacerbated by: walking down stairs. He has tried acetaminophen for the symptoms. The treatment provided mild relief.   Has taken hydrocodone/apap which he feels makes him drowsy and not very effective. He has taken oxycodone/apap in the past which he found to be much more effective. He does not feel he needs to take it every day. He has been treated at [facility] years ago with steroid injection which he didn't find to be effective.    Allergies  Allergen Reactions  . Codeine     Heart racing  . Cozaar [Losartan Potassium]     angioedema  . Cyclobenzaprine     Musculoskeletal therapy agents cause Excessive sedation  . Ultram  [Tramadol Hcl] Nausea And Vomiting  . Celebrex  [Celecoxib] Itching, Rash and Swelling     Current Outpatient Prescriptions:  .  acetaminophen (TYLENOL) 500 MG tablet, Take 2 tablets (1,000 mg total) by mouth every 6 (six) hours as needed for mild pain or moderate pain., Disp: 100 tablet, Rfl: 0 .  amLODipine (NORVASC) 5 MG tablet, TAKE 1 TABLET BY MOUTH EVERY DAY, Disp: 30 tablet, Rfl: 12 .  Ascorbic Acid (VITAMIN C) 1000 MG tablet, Take 2 tablets by mouth daily., Disp: , Rfl:  .  aspirin 81 MG tablet, Take 1 tablet by mouth daily., Disp: , Rfl:  .  docusate sodium (COLACE) 100 MG capsule, Take 1 capsule by mouth daily as needed., Disp: , Rfl:  .  Multiple Vitamins-Minerals (MULTIVITAMIN ADULT PO),  Take 1 tablet by mouth daily., Disp: , Rfl:  .  sildenafil (VIAGRA) 50 MG tablet, Take 0.5-1 tablets by mouth daily as needed., Disp: , Rfl:  .  UNABLE TO FIND, Med Name: tumeric 500mg  twice a day, Disp: , Rfl:  .  valACYclovir (VALTREX) 500 MG tablet, TAKE 1 TABLET BY MOUTH TWICE DAILY FOR 3 DAYS AS NEEDED, Disp: 6 tablet, Rfl: 3  Review of Systems  Constitutional: Negative for appetite change, chills and fever.  Respiratory: Negative for chest tightness, shortness of breath and wheezing.   Cardiovascular: Negative for chest pain and palpitations.  Gastrointestinal: Negative for abdominal pain, nausea and vomiting.  Musculoskeletal: Positive for arthralgias (bilateral knee pain) and joint swelling (swelling in both knees).  Neurological: Negative for tingling and numbness.    Social History  Substance Use Topics  . Smoking status: Former Smoker    Packs/day: 3.00    Years: 15.00    Types: Cigarettes    Quit date: 04/12/1968  . Smokeless tobacco: Former Systems developer  . Alcohol use No   Objective:   BP 120/74 (BP Location: Left Arm, Patient Position: Sitting, Cuff Size: Normal)   Pulse 83   Temp 97.8 F (36.6 C) (Oral)   Resp 16   Ht 6' (1.829 m)   Wt 183 lb (83 kg)   SpO2 99% Comment: room air  BMI 24.82 kg/m  There were no vitals filed for this visit.   Physical Exam   General Appearance:    Alert, cooperative, no distress  Eyes:    PERRL, conjunctiva/corneas clear, EOM's intact       Lungs:     Clear to auscultation bilaterally, respirations unlabored  Heart:    Regular rate and rhythm  Neurologic:   Awake, alert, oriented x 3. No apparent focal neurological           defect.   MS: Diffuse tenderness along joint lines of both knees. No effusion. No erythema.        Assessment & Plan:     1. Primary osteoarthritis of both knees  - oxyCODONE-acetaminophen (PERCOCET) 7.5-325 MG tablet; Take 1 tablet by mouth every 8 (eight) hours as needed for severe pain.  Dispense: 30  tablet; Refill: 0 - diclofenac sodium (VOLTAREN) 1 % GEL; Apply 4 g topically 4 (four) times daily.  Dispense: 100 g; Refill: 5  He does not intend to take oxycodone/apap daily. If he needs to take it more frequently over time will need to get back in with orthopedics.       Lelon Huh, MD  [facility]

## 2017-02-10 ENCOUNTER — Telehealth: Payer: Self-pay

## 2017-02-10 NOTE — Telephone Encounter (Signed)
Tasha from [facility] called in regards to pt's PA for Diclofenac. CB# [phone].

## 2017-02-11 NOTE — Telephone Encounter (Signed)
Called [facility] in regards to medication. Diclofenac was approved thru 03/2018. Pharmacy was contacted.

## 2017-03-10 ENCOUNTER — Other Ambulatory Visit: Payer: Self-pay | Admitting: Family Medicine

## 2017-03-10 DIAGNOSIS — M170 Bilateral primary osteoarthritis of knee: Secondary | ICD-10-CM

## 2017-03-10 MED ORDER — OXYCODONE-ACETAMINOPHEN 7.5-325 MG PO TABS: 1.0000 | ORAL_TABLET | Freq: Three times a day (TID) | ORAL | 0 refills | Status: DC | PRN

## 2017-03-10 NOTE — Telephone Encounter (Signed)
Patient advised.

## 2017-03-10 NOTE — Telephone Encounter (Signed)
Needs refill sent to [facility] in [facility] for Oxycodone.

## 2017-03-10 NOTE — Telephone Encounter (Signed)
Please advise patient that oxycodone/apap  prescription has been sent electronically to   [facility] [place] [address] Phone: [phone] Fax: [phone]

## 2017-04-14 ENCOUNTER — Other Ambulatory Visit: Payer: Self-pay | Admitting: Family Medicine

## 2017-04-14 DIAGNOSIS — M170 Bilateral primary osteoarthritis of knee: Secondary | ICD-10-CM

## 2017-04-14 MED ORDER — OXYCODONE-ACETAMINOPHEN 7.5-325 MG PO TABS: 1.0000 | ORAL_TABLET | Freq: Three times a day (TID) | ORAL | 0 refills | Status: DC | PRN

## 2017-04-14 NOTE — Addendum Note (Signed)
Addended by: Julieta Bellini on: 04/14/2017 04:02 PM   Modules accepted: Orders

## 2017-04-14 NOTE — Telephone Encounter (Signed)
Called pt back. Patient is requesting refill for oxycodone be sent to [facility].

## 2017-04-14 NOTE — Telephone Encounter (Signed)
Patient wants a refill on Hydrocodone

## 2017-04-28 ENCOUNTER — Encounter: Payer: Self-pay | Admitting: Family Medicine

## 2017-04-28 ENCOUNTER — Ambulatory Visit (INDEPENDENT_AMBULATORY_CARE_PROVIDER_SITE_OTHER): Payer: Medicare Other | Admitting: Family Medicine

## 2017-04-28 VITALS — BP 118/70 | HR 81 | Temp 98.2°F | Resp 16 | Wt 182.0 lb

## 2017-04-28 DIAGNOSIS — J0100 Acute maxillary sinusitis, unspecified: Secondary | ICD-10-CM | POA: Diagnosis not present

## 2017-04-28 DIAGNOSIS — R05 Cough: Secondary | ICD-10-CM | POA: Diagnosis not present

## 2017-04-28 DIAGNOSIS — R059 Cough, unspecified: Secondary | ICD-10-CM

## 2017-04-28 DIAGNOSIS — J40 Bronchitis, not specified as acute or chronic: Secondary | ICD-10-CM | POA: Diagnosis not present

## 2017-04-28 LAB — POCT INFLUENZA A/B
Influenza A, POC: NEGATIVE
Influenza B, POC: NEGATIVE

## 2017-04-28 MED ORDER — AMOXICILLIN-POT CLAVULANATE 875-125 MG PO TABS: 1.0000 | ORAL_TABLET | Freq: Two times a day (BID) | ORAL | 0 refills | Status: DC

## 2017-04-28 MED ORDER — HYDROCODONE-HOMATROPINE 5-1.5 MG/5ML PO SYRP: 5.0000 mL | ORAL_SOLUTION | Freq: Three times a day (TID) | ORAL | 0 refills | Status: DC | PRN

## 2017-04-28 NOTE — Progress Notes (Signed)
Patient: Jerry Mcpherson Male    DOB: 1940-09-14   77 y.o.   MRN: [id number] Visit Date: 04/28/2017  Today's Provider: Lelon Huh, MD   Chief Complaint  Patient presents with  . Cough   Subjective:    Patient has had cough and sinus pressure for 2 days. Patient also has symptoms of chills, sweats, low-grade fever, headache, sore throat, and fatigue. Patient has been taking otc mucinex and tylenol with mild relief.    Cough  This is a new problem. Episode onset: 2 days ago. The problem has been gradually worsening. The cough is productive of sputum. Associated symptoms include chills, a fever, headaches, nasal congestion, postnasal drip, a sore throat and sweats. Pertinent negatives include no chest pain, ear congestion, ear pain, heartburn, hemoptysis, myalgias, rash, rhinorrhea, shortness of breath, weight loss or wheezing. The symptoms are aggravated by lying down and exercise. Treatments tried: mucinex and tylenol. The treatment provided mild relief. His past medical history is significant for bronchitis.   States had rapid onset of frontal and maxillary sinus pressure 2 days ago. Temperature was was nearly 100 degrees.     Allergies  Allergen Reactions  . Codeine     Heart racing  . Cozaar [Losartan Potassium]     angioedema  . Cyclobenzaprine     Musculoskeletal therapy agents cause Excessive sedation  . Ultram  [Tramadol Hcl] Nausea And Vomiting  . Celebrex  [Celecoxib] Itching, Rash and Swelling     Current Outpatient Medications:  .  acetaminophen (TYLENOL) 500 MG tablet, Take 2 tablets (1,000 mg total) by mouth every 6 (six) hours as needed for mild pain or moderate pain., Disp: 100 tablet, Rfl: 0 .  amLODipine (NORVASC) 5 MG tablet, TAKE 1 TABLET BY MOUTH EVERY DAY, Disp: 30 tablet, Rfl: 12 .  Ascorbic Acid (VITAMIN C) 1000 MG tablet, Take 2 tablets by mouth daily., Disp: , Rfl:  .  aspirin 81 MG tablet, Take 1 tablet by mouth daily., Disp: , Rfl:  .   diclofenac sodium (VOLTAREN) 1 % GEL, Apply 4 g topically 4 (four) times daily., Disp: 100 g, Rfl: 5 .  docusate sodium (COLACE) 100 MG capsule, Take 1 capsule by mouth daily as needed., Disp: , Rfl:  .  Multiple Vitamins-Minerals (MULTIVITAMIN ADULT PO), Take 1 tablet by mouth daily., Disp: , Rfl:  .  oxyCODONE-acetaminophen (PERCOCET) 7.5-325 MG tablet, Take 1 tablet by mouth every 8 (eight) hours as needed for severe pain., Disp: 30 tablet, Rfl: 0 .  sildenafil (VIAGRA) 50 MG tablet, Take 0.5-1 tablets by mouth daily as needed., Disp: , Rfl:  .  UNABLE TO FIND, Med Name: tumeric 500mg  twice a day, Disp: , Rfl:  .  valACYclovir (VALTREX) 500 MG tablet, TAKE 1 TABLET BY MOUTH TWICE DAILY FOR 3 DAYS AS NEEDED, Disp: 6 tablet, Rfl: 3  Review of Systems  Constitutional: Positive for chills and fever. Negative for appetite change and weight loss.  HENT: Positive for congestion, postnasal drip and sore throat. Negative for ear pain and rhinorrhea.   Respiratory: Positive for cough. Negative for hemoptysis, chest tightness, shortness of breath and wheezing.   Cardiovascular: Negative for chest pain and palpitations.  Gastrointestinal: Negative for abdominal pain, heartburn, nausea and vomiting.  Musculoskeletal: Negative for myalgias.  Skin: Negative for rash.  Neurological: Positive for headaches.    Social History   Tobacco Use  . Smoking status: Former Smoker    Packs/day: 3.00  Years: 15.00    Pack years: 45.00    Types: Cigarettes    Last attempt to quit: 04/12/1968    Years since quitting: 49.0  . Smokeless tobacco: Former Network engineer Use Topics  . Alcohol use: No    Alcohol/week: 0.0 oz   Objective:   BP 118/70 (BP Location: Right Arm, Patient Position: Sitting, Cuff Size: Large)   Pulse 81   Temp 98.2 F (36.8 C) (Oral)   Resp 16   Wt 182 lb (82.6 kg)   SpO2 94%   BMI 24.68 kg/m  Vitals:   04/28/17 1032  BP: 118/70  Pulse: 81  Resp: 16  Temp: 98.2 F (36.8 C)    TempSrc: Oral  SpO2: 94%  Weight: 182 lb (82.6 kg)     Physical Exam   General Appearance:    Alert, cooperative, no distress  HENT:   both sides TM normal without fluid or infection, neck without nodes, throat normal without erythema or exudate, maxillary and frontal sinus tender and nasal mucosa pale and congested  Eyes:    PERRL, conjunctiva/corneas clear, EOM's intact       Lungs:     Occasional expiratory wheeze, no rales to auscultation bilaterally, respirations unlabored  Heart:    Regular rate and rhythm  Neurologic:   Awake, alert, oriented x 3. No apparent focal neurological           defect.       Results for orders placed or performed in visit on 04/28/17  POCT Influenza A/B  Result Value Ref Range   Influenza A, POC Negative Negative   Influenza B, POC Negative Negative       Assessment & Plan:     1. Cough  - POCT Influenza A/B - HYDROcodone-homatropine (HYCODAN) 5-1.5 MG/5ML syrup; Take 5 mLs by mouth every 8 (eight) hours as needed for cough.  Dispense: 120 mL; Refill: 0  2. Acute maxillary sinusitis, recurrence not specified  - amoxicillin-clavulanate (AUGMENTIN) 875-125 MG tablet; Take 1 tablet by mouth 2 (two) times daily for 10 days.  Dispense: 20 tablet; Refill: 0  3. Bronchitis  Call if symptoms change or if not rapidly improving.           Lelon Huh, MD  [facility]

## 2017-04-29 ENCOUNTER — Telehealth: Payer: Self-pay | Admitting: Family Medicine

## 2017-04-29 NOTE — Telephone Encounter (Signed)
Patient was concerned because after starting the antibiotic yesterday he had two episode of sweating so much his bed sheets were wet.  He also complains of headache.  When asked if he was running fever last night he did not know for sure.  He said he did have fever earlier.   I advised that he may have had fever and the antibiotic helped to break the fever causing the sweats.  Advised to increased fluids as this may be contributing to the headache.   He will cal if this does not help or if he has changes in his symptoms.   I told him I would call him back if you had any other instructions.

## 2017-04-29 NOTE — Telephone Encounter (Signed)
Pt states he wants to someone to call him back regarding the antibiotic that was given to him yesterday.  States he is not feeling better and wants to discuss symptoms he is having

## 2017-05-17 ENCOUNTER — Other Ambulatory Visit: Payer: Self-pay | Admitting: Family Medicine

## 2017-05-17 DIAGNOSIS — M170 Bilateral primary osteoarthritis of knee: Secondary | ICD-10-CM

## 2017-05-17 MED ORDER — OXYCODONE-ACETAMINOPHEN 7.5-325 MG PO TABS: 1.0000 | ORAL_TABLET | Freq: Three times a day (TID) | ORAL | 0 refills | Status: DC | PRN

## 2017-05-17 NOTE — Telephone Encounter (Signed)
Patient needs refill on Oxycodone 7.5-325 mg. To [facility] in [place]

## 2017-05-18 NOTE — Telephone Encounter (Signed)
Pt advised.

## 2017-06-13 ENCOUNTER — Other Ambulatory Visit: Payer: Self-pay | Admitting: Family Medicine

## 2017-06-13 DIAGNOSIS — M170 Bilateral primary osteoarthritis of knee: Secondary | ICD-10-CM

## 2017-06-13 MED ORDER — DICLOFENAC SODIUM 1 % TD GEL: 4.0000 g | Freq: Four times a day (QID) | TRANSDERMAL | 5 refills | Status: DC

## 2017-06-13 MED ORDER — OXYCODONE-ACETAMINOPHEN 7.5-325 MG PO TABS: 1.0000 | ORAL_TABLET | Freq: Three times a day (TID) | ORAL | 0 refills | Status: DC | PRN

## 2017-06-13 NOTE — Telephone Encounter (Signed)
Pt requesting refill of oxycodone 7.5-325 and diclofenac sodium 1% gel.   Sent to [facility] in [place]

## 2017-07-10 ENCOUNTER — Other Ambulatory Visit: Payer: Self-pay | Admitting: Family Medicine

## 2017-07-13 ENCOUNTER — Other Ambulatory Visit: Payer: Self-pay | Admitting: Family Medicine

## 2017-07-13 DIAGNOSIS — M170 Bilateral primary osteoarthritis of knee: Secondary | ICD-10-CM

## 2017-07-13 MED ORDER — OXYCODONE-ACETAMINOPHEN 7.5-325 MG PO TABS: 1.0000 | ORAL_TABLET | Freq: Three times a day (TID) | ORAL | 0 refills | Status: DC | PRN

## 2017-07-13 NOTE — Telephone Encounter (Signed)
Patient is requesting a refill on the following medication  oxyCODONE-acetaminophen (PERCOCET) 7.5-325 MG tablet   He uses [facility] in [place]

## 2017-07-13 NOTE — Telephone Encounter (Signed)
Please review. Thanks!  

## 2017-08-09 ENCOUNTER — Other Ambulatory Visit: Payer: Self-pay | Admitting: Family Medicine

## 2017-08-10 ENCOUNTER — Other Ambulatory Visit: Payer: Self-pay | Admitting: Family Medicine

## 2017-08-10 DIAGNOSIS — M170 Bilateral primary osteoarthritis of knee: Secondary | ICD-10-CM

## 2017-08-10 MED ORDER — OXYCODONE-ACETAMINOPHEN 7.5-325 MG PO TABS: 1.0000 | ORAL_TABLET | Freq: Three times a day (TID) | ORAL | 0 refills | Status: DC | PRN

## 2017-08-10 NOTE — Telephone Encounter (Signed)
Pt requesting refill of Oxycodone 7.5-325 be called into the [facility] in [place]

## 2017-08-26 ENCOUNTER — Encounter: Payer: Self-pay | Admitting: Family Medicine

## 2017-08-26 ENCOUNTER — Other Ambulatory Visit: Payer: Self-pay

## 2017-08-26 ENCOUNTER — Ambulatory Visit (INDEPENDENT_AMBULATORY_CARE_PROVIDER_SITE_OTHER): Payer: Medicare Other | Admitting: Family Medicine

## 2017-08-26 ENCOUNTER — Ambulatory Visit (INDEPENDENT_AMBULATORY_CARE_PROVIDER_SITE_OTHER): Payer: Medicare Other

## 2017-08-26 VITALS — BP 128/60 | HR 72 | Temp 98.5°F | Wt 175.2 lb

## 2017-08-26 VITALS — BP 128/60 | HR 72 | Temp 98.5°F | Ht 72.0 in | Wt 175.2 lb

## 2017-08-26 DIAGNOSIS — Z0000 Encounter for general adult medical examination without abnormal findings: Secondary | ICD-10-CM

## 2017-08-26 DIAGNOSIS — I10 Essential (primary) hypertension: Secondary | ICD-10-CM

## 2017-08-26 DIAGNOSIS — Z125 Encounter for screening for malignant neoplasm of prostate: Secondary | ICD-10-CM | POA: Diagnosis not present

## 2017-08-26 DIAGNOSIS — M170 Bilateral primary osteoarthritis of knee: Secondary | ICD-10-CM | POA: Diagnosis not present

## 2017-08-26 MED ORDER — OXYCODONE-ACETAMINOPHEN 7.5-325 MG PO TABS: 1.0000 | ORAL_TABLET | Freq: Three times a day (TID) | ORAL | 0 refills | Status: DC | PRN

## 2017-08-26 NOTE — Patient Instructions (Signed)
 The CDC recommends two doses of Shingrix (the shingles vaccine) separated by 2 to 6 months for adults age 79 years and older. I recommend checking with your insurance plan regarding coverage for this vaccine.     Preventive Care 65 Years and Older, Male Preventive care refers to lifestyle choices and visits with your health care provider that can promote health and wellness. What does preventive care include?  A yearly physical exam. This is also called an annual well check.  Dental exams once or twice a year.  Routine eye exams. Ask your health care provider how often you should have your eyes checked.  Personal lifestyle choices, including: ? Daily care of your teeth and gums. ? Regular physical activity. ? Eating a healthy diet. ? Avoiding tobacco and drug use. ? Limiting alcohol use. ? Practicing safe sex. ? Taking low doses of aspirin every day. ? Taking vitamin and mineral supplements as recommended by your health care provider. What happens during an annual well check? The services and screenings done by your health care provider during your annual well check will depend on your age, overall health, lifestyle risk factors, and family history of disease. Counseling Your health care provider may ask you questions about your:  Alcohol use.  Tobacco use.  Drug use.  Emotional well-being.  Home and relationship well-being.  Sexual activity.  Eating habits.  History of falls.  Memory and ability to understand (cognition).  Work and work environment.  Screening You may have the following tests or measurements:  Height, weight, and BMI.  Blood pressure.  Lipid and cholesterol levels. These may be checked every 5 years, or more frequently if you are over 79 years old.  Skin check.  Lung cancer screening. You may have this screening every year starting at age 55 if you have a 30-pack-year history of smoking and currently smoke or have quit within the past 15  years.  Fecal occult blood test (FOBT) of the stool. You may have this test every year starting at age 79.  Flexible sigmoidoscopy or colonoscopy. You may have a sigmoidoscopy every 5 years or a colonoscopy every 10 years starting at age 79.  Prostate cancer screening. Recommendations will vary depending on your family history and other risks.  Hepatitis C blood test.  Hepatitis B blood test.  Sexually transmitted disease (STD) testing.  Diabetes screening. This is done by checking your blood sugar (glucose) after you have not eaten for a while (fasting). You may have this done every 1-3 years.  Abdominal aortic aneurysm (AAA) screening. You may need this if you are a current or former smoker.  Osteoporosis. You may be screened starting at age 70 if you are at high risk.  Talk with your health care provider about your test results, treatment options, and if necessary, the need for more tests. Vaccines Your health care provider may recommend certain vaccines, such as:  Influenza vaccine. This is recommended every year.  Tetanus, diphtheria, and acellular pertussis (Tdap, Td) vaccine. You may need a Td booster every 10 years.  Varicella vaccine. You may need this if you have not been vaccinated.  Zoster vaccine. You may need this after age 60.  Measles, mumps, and rubella (MMR) vaccine. You may need at least one dose of MMR if you were born in 1957 or later. You may also need a second dose.  Pneumococcal 13-valent conjugate (PCV13) vaccine. One dose is recommended after age 65.  Pneumococcal polysaccharide (PPSV23) vaccine. One dose   is recommended after age 65.  Meningococcal vaccine. You may need this if you have certain conditions.  Hepatitis A vaccine. You may need this if you have certain conditions or if you travel or work in places where you may be exposed to hepatitis A.  Hepatitis B vaccine. You may need this if you have certain conditions or if you travel or work in  places where you may be exposed to hepatitis B.  Haemophilus influenzae type b (Hib) vaccine. You may need this if you have certain risk factors.  Talk to your health care provider about which screenings and vaccines you need and how often you need them. This information is not intended to replace advice given to you by your health care provider. Make sure you discuss any questions you have with your health care provider. Document Released: 04/25/2015 Document Revised: 12/17/2015 Document Reviewed: 01/28/2015 Elsevier Interactive Patient Education  2018 [employer]  

## 2017-08-26 NOTE — Progress Notes (Signed)
Patient: Jerry Mcpherson, Male    DOB: 1940/12/06, 77 y.o.   MRN: [id number] Visit Date: 08/26/2017  Today's Provider: Lelon Huh, MD   Chief Complaint  Patient presents with  . Annual Exam  . Hypertension   Subjective:   Patient saw Jerry Mcpherson for AWV toady at 9:40 am.   Complete Physical Jerry Mcpherson is a 77 y.o. male. He feels fairly well. He reports exercising work. He reports he is sleeping fairly well.  ----------------------------------------------------------   Hypertension, follow-up:  BP Readings from Last 3 Encounters:  08/26/17 128/60  08/26/17 128/60  04/28/17 118/70    He was last seen for hypertension 7 months ago.  BP at that visit was 138/78. Management since that visit includes; no changes.He reports good compliance with treatment. He is not having side effects.  He is not exercising. He is not adherent to low salt diet.   Outside blood pressures are consistent. He is experiencing none.  Patient denies none.   Cardiovascular risk factors include none.  Use of agents associated with hypertension: none.   ------------------------------------------------------------------------  Primary osteoarthritis of both knees From 02/04/2017-given rx for oxyCODONE-acetaminophen (PERCOCET) 7.5-325 MG tablet and diclofenac sodium (VOLTAREN) 1 % GEL. He does not intend to take oxycodone/apap daily. If he needs to take it more frequently over time will need to get back in with orthopedics.     Review of Systems  Constitutional: Negative for chills, diaphoresis and fever.  HENT: Negative for congestion, ear discharge, ear pain, hearing loss, nosebleeds, sore throat and tinnitus.   Eyes: Negative for photophobia, pain, discharge and redness.  Respiratory: Negative for cough, shortness of breath, wheezing and stridor.   Cardiovascular: Negative for chest pain, palpitations and leg swelling.  Gastrointestinal: Negative for abdominal pain, blood  in stool, constipation, diarrhea, nausea and vomiting.  Endocrine: Negative for polydipsia.  Genitourinary: Negative for dysuria, flank pain, frequency, hematuria and urgency.  Musculoskeletal: Negative for back pain, myalgias and neck pain.  Skin: Negative for rash.  Allergic/Immunologic: Negative for environmental allergies.  Neurological: Negative for dizziness, tremors, seizures, weakness and headaches.  Hematological: Does not bruise/bleed easily.  Psychiatric/Behavioral: Negative for hallucinations and suicidal ideas. The patient is not nervous/anxious.      Social History   Socioeconomic History  . Marital status: Single    Spouse name: Not on file  . Number of children: 1  . Years of education: Not on file  . Highest education level: Some college, no degree  Occupational History  . Occupation: Retired  . Occupation: Loss adjuster, chartered part time  Social Needs  . Financial resource strain: Not hard at all  . Food insecurity:    Worry: Never true    Inability: Never true  . Transportation needs:    Medical: No    Non-medical: No  Tobacco Use  . Smoking status: Former Smoker    Packs/day: 3.00    Years: 15.00    Pack years: 45.00    Types: Cigarettes    Last attempt to quit: 04/12/1968    Years since quitting: 49.4  . Smokeless tobacco: Never Used  Substance and Sexual Activity  . Alcohol use: No    Alcohol/week: 0.0 oz  . Drug use: No  . Sexual activity: Not on file  Lifestyle  . Physical activity:    Days per week: Not on file    Minutes per session: Not on file  . Stress: Not on file  Relationships  . Social connections:  Talks on phone: Not on file    Gets together: Not on file    Attends religious service: Not on file    Active member of club or organization: Not on file    Attends meetings of clubs or organizations: Not on file    Relationship status: Not on file  . Intimate partner violence:    Fear of current or ex partner: Not on file     Emotionally abused: Not on file    Physically abused: Not on file    Forced sexual activity: Not on file  Other Topics Concern  . Not on file  Social History Narrative  . Not on file    Past Medical History:  Diagnosis Date  . Genital herpes   . Hyperglycemia   . Hypertension   . Squamous cell carcinoma      Patient Active Problem List   Diagnosis Date Noted  . Erectile dysfunction 12/10/2014  . Genital HSV 12/10/2014  . Hypertension 12/10/2014  . Leg cramps 12/10/2014  . Leg pain 12/10/2014  . Neuralgia 12/10/2014  . Pinched nerve 12/10/2014  . Radiculopathy 12/10/2014  . Squamous cell carcinoma 12/10/2014  . Vertigo 12/10/2014  . Hyperglycemia 01/03/2009    Past Surgical History:  Procedure Laterality Date  . CATARACT EXTRACTION Left 10/2012   Dr. Murvin Natal; [facility]  . EYE SURGERY Left    removed a piece of steel  . HERNIA REPAIR  27/51/7001   umbilical hernia, incarderated; Dr. Bary Castilla  . ROTATOR CUFF REPAIR    . SQUAMOUS CELL CARCINOMA EXCISION  2008  . TONSILLECTOMY      His family history includes COPD in his sister; Cancer in his father; Congestive Heart Failure in his brother; Heart attack in his mother; Hypertension in his mother; Stroke in his father.      Current Outpatient Medications:  .  acetaminophen (TYLENOL) 500 MG tablet, Take 2 tablets (1,000 mg total) by mouth every 6 (six) hours as needed for mild pain or moderate pain., Disp: 100 tablet, Rfl: 0 .  amLODipine (NORVASC) 5 MG tablet, TAKE 1 TABLET BY MOUTH EVERY DAY, Disp: 30 tablet, Rfl: 5 .  Ascorbic Acid (VITAMIN C) 1000 MG tablet, Take 2 tablets by mouth daily., Disp: , Rfl:  .  aspirin 81 MG tablet, Take 1 tablet by mouth daily., Disp: , Rfl:  .  diclofenac sodium (VOLTAREN) 1 % GEL, Apply 4 g topically 4 (four) times daily., Disp: 100 g, Rfl: 5 .  docusate sodium (COLACE) 100 MG capsule, Take 1 capsule by mouth daily as needed., Disp: , Rfl:  .  Multiple Vitamins-Minerals  (MULTIVITAMIN ADULT PO), Take 1 tablet by mouth daily., Disp: , Rfl:  .  oxyCODONE-acetaminophen (PERCOCET) 7.5-325 MG tablet, Take 1 tablet by mouth every 8 (eight) hours as needed for severe pain., Disp: 30 tablet, Rfl: 0 .  sildenafil (VIAGRA) 50 MG tablet, Take 0.5-1 tablets by mouth daily as needed., Disp: , Rfl:  .  UNABLE TO FIND, Med Name: tumeric 500mg  twice a day, Disp: , Rfl:  .  valACYclovir (VALTREX) 500 MG tablet, TAKE 1 TABLET BY MOUTH TWICE DAILY FOR 3 DAYS AS NEEDED, Disp: 6 tablet, Rfl: 3  Patient Care Team: Birdie Sons, MD as PCP - General (Family Medicine)     Objective:   Vitals: BP 128/60   Pulse 72   Temp 98.5 F (36.9 C) (Oral)   Wt 175 lb 3.2 oz (79.5 kg)   BMI 23.76 kg/m  Physical Exam   General Appearance:    Alert, cooperative, no distress, appears stated age  Head:    Normocephalic, without obvious abnormality, atraumatic  Eyes:    PERRL, conjunctiva/corneas clear, EOM's intact, fundi    benign, both eyes       Ears:    Normal TM's and external ear canals, both ears  Nose:   Nares normal, septum midline, mucosa normal, no drainage   or sinus tenderness  Throat:   Lips, mucosa, and tongue normal; teeth and gums normal  Neck:   Supple, symmetrical, trachea midline, no adenopathy;       thyroid:  No enlargement/tenderness/nodules; no carotid   bruit or JVD  Back:     Symmetric, no curvature, ROM normal, no CVA tenderness  Lungs:     Clear to auscultation bilaterally, respirations unlabored  Chest wall:    No tenderness or deformity  Heart:    Regular rate and rhythm, S1 and S2 normal, no murmur, rub   or gallop  Abdomen:     Soft, non-tender, bowel sounds active all four quadrants,    no masses, no organomegaly  Genitalia:    deferred  Rectal:    deferred  Extremities:   Extremities normal, atraumatic, no cyanosis or edema  Pulses:   2+ and symmetric all extremities  Skin:   Skin color, texture, turgor normal, no rashes or lesions  Lymph  nodes:   Cervical, supraclavicular, and axillary nodes normal  Neurologic:   CNII-XII intact. Normal strength, sensation and reflexes      throughout    Activities of Daily Living In your present state of health, do you have any difficulty performing the following activities: 08/26/2017  Hearing? Y  Comment Due to loud noises from previous job. Pt declined hearing aids.   Vision? N  Comment Pt plans to set up an eye exam this year. Pt has yearly eye exams.   Difficulty concentrating or making decisions? N  Walking or climbing stairs? Y  Comment Due to knee pains.   Dressing or bathing? N  Doing errands, shopping? N  Preparing Food and eating ? N  Using the Toilet? N  In the past six months, have you accidently leaked urine? N  Do you have problems with loss of bowel control? N  Managing your Medications? N  Managing your Finances? N  Housekeeping or managing your Housekeeping? N  Some recent data might be hidden    Fall Risk Assessment Fall Risk  08/26/2017 08/24/2016 01/28/2016 12/12/2014  Falls in the past year? No No No No     Depression Screen PHQ 2/9 Scores 08/26/2017 08/26/2017 08/24/2016 08/24/2016  PHQ - 2 Score 0 0 0 0  PHQ- 9 Score 0 - 0 -      Assessment & Plan:    Annual Physical Reviewed patient's Family Medical History Reviewed and updated list of patient's medical providers Assessment of cognitive impairment was done Assessed patient's functional ability Established a written schedule for health screening [employer] Completed and Reviewed  Exercise Activities and Dietary recommendations Goals    None      Immunization History  Administered Date(s) Administered  . Influenza Split 01/01/2009  . Influenza, High Dose Seasonal PF 12/31/2013, 12/12/2014, 01/28/2016, 02/03/2017  . Pneumococcal Conjugate-13 12/31/2013  . Pneumococcal Polysaccharide-23 02/22/2004  . Tdap 10/28/2011  . Zoster 01/28/2009    Health Maintenance  Topic  Date Due  . INFLUENZA VACCINE  11/10/2017  . TETANUS/TDAP  10/27/2021  .  PNA vac Low Risk Adult  Completed     Discussed health benefits of physical activity, and encouraged him to engage in regular exercise appropriate for his age and condition.    ------------------------------------------------------------------------------------------------------------  1. Annual physical exam Generally doing well, unremarkable exam.   2. Primary osteoarthritis of both knees Doing well with topical NSAID and  prn- oxyCODONE-acetaminophen (PERCOCET) 7.5-325 MG tablet; Take 1 tablet by mouth every 8 (eight) hours as needed for severe pain.  Dispense: 30 tablet; Refill: 0  3. Essential hypertension Well controlled.  Continue current medications.   - Comprehensive metabolic panel - Lipid panel - EKG 12-Lead  4. Prostate cancer screening  - PSA   Lelon Huh, MD  [facility]

## 2017-08-26 NOTE — Progress Notes (Signed)
Subjective:   Jerry Mcpherson is a 77 y.o. male who presents for Medicare Annual/Subsequent preventive examination.  Review of Systems:  N/A  Cardiac Risk Factors include: advanced age (>21men, >53 women);hypertension;male gender     Objective:    Vitals: BP 128/60 (BP Location: Right Arm)   Pulse 72   Temp 98.5 F (36.9 C) (Oral)   Ht 6' (1.829 m)   Wt 175 lb 3.2 oz (79.5 kg)   BMI 23.76 kg/m   Body mass index is 23.76 kg/m.  Advanced Directives 08/26/2017 08/24/2016 08/24/2016  Does Patient Have a Medical Advance Directive? No No No  Would patient like information on creating a medical advance directive? No - Patient declined - -    Tobacco Social History   Tobacco Use  Smoking Status Former Smoker  . Packs/day: 3.00  . Years: 15.00  . Pack years: 45.00  . Types: Cigarettes  . Last attempt to quit: 04/12/1968  . Years since quitting: 49.4  Smokeless Tobacco Never Used     Counseling given: Not Answered   Clinical Intake:  Pre-visit preparation completed: Yes  Pain : No/denies pain Pain Score: 0-No pain     Nutritional Status: BMI of 19-24  Normal Nutritional Risks: None Diabetes: No  How often do you need to have someone help you when you read instructions, pamphlets, or other written materials from your doctor or pharmacy?: 1 - Never  Interpreter Needed?: No  Information entered by :: [facility], LPN  Past Medical History:  Diagnosis Date  . Genital herpes   . Hyperglycemia   . Hypertension   . Squamous cell carcinoma    Past Surgical History:  Procedure Laterality Date  . CATARACT EXTRACTION Left 10/2012   Dr. Murvin Natal; [facility]  . EYE SURGERY Left    removed a piece of steel  . HERNIA REPAIR  99/24/2683   umbilical hernia, incarderated; Dr. Bary Castilla  . ROTATOR CUFF REPAIR    . SQUAMOUS CELL CARCINOMA EXCISION  2008  . TONSILLECTOMY     Family History  Problem Relation Age of Onset  . Hypertension Mother   . Heart  attack Mother   . Stroke Father   . Cancer Father   . COPD Sister   . Congestive Heart Failure Brother    Social History   Socioeconomic History  . Marital status: Single    Spouse name: Not on file  . Number of children: 1  . Years of education: Not on file  . Highest education level: Some college, no degree  Occupational History  . Occupation: Retired  . Occupation: Loss adjuster, chartered part time  Social Needs  . Financial resource strain: Not hard at all  . Food insecurity:    Worry: Never true    Inability: Never true  . Transportation needs:    Medical: No    Non-medical: No  Tobacco Use  . Smoking status: Former Smoker    Packs/day: 3.00    Years: 15.00    Pack years: 45.00    Types: Cigarettes    Last attempt to quit: 04/12/1968    Years since quitting: 49.4  . Smokeless tobacco: Never Used  Substance and Sexual Activity  . Alcohol use: No    Alcohol/week: 0.0 oz  . Drug use: No  . Sexual activity: Not on file  Lifestyle  . Physical activity:    Days per week: Not on file    Minutes per session: Not on file  . Stress: Not  on file  Relationships  . Social connections:    Talks on phone: Not on file    Gets together: Not on file    Attends religious service: Not on file    Active member of club or organization: Not on file    Attends meetings of clubs or organizations: Not on file    Relationship status: Not on file  Other Topics Concern  . Not on file  Social History Narrative  . Not on file    Outpatient Encounter Medications as of 08/26/2017  Medication Sig  . acetaminophen (TYLENOL) 500 MG tablet Take 2 tablets (1,000 mg total) by mouth every 6 (six) hours as needed for mild pain or moderate pain.  Marland Kitchen amLODipine (NORVASC) 5 MG tablet TAKE 1 TABLET BY MOUTH EVERY DAY  . Ascorbic Acid (VITAMIN C) 1000 MG tablet Take 2 tablets by mouth daily.  Marland Kitchen aspirin 81 MG tablet Take 1 tablet by mouth daily.  . diclofenac sodium (VOLTAREN) 1 % GEL Apply 4 g  topically 4 (four) times daily.  Marland Kitchen docusate sodium (COLACE) 100 MG capsule Take 1 capsule by mouth daily as needed.  . Multiple Vitamins-Minerals (MULTIVITAMIN ADULT PO) Take 1 tablet by mouth daily.  Marland Kitchen oxyCODONE-acetaminophen (PERCOCET) 7.5-325 MG tablet Take 1 tablet by mouth every 8 (eight) hours as needed for severe pain.  . sildenafil (VIAGRA) 50 MG tablet Take 0.5-1 tablets by mouth daily as needed.  Marland Kitchen UNABLE TO FIND Med Name: tumeric 500mg  twice a day  . valACYclovir (VALTREX) 500 MG tablet TAKE 1 TABLET BY MOUTH TWICE DAILY FOR 3 DAYS AS NEEDED  . HYDROcodone-homatropine (HYCODAN) 5-1.5 MG/5ML syrup Take 5 mLs by mouth every 8 (eight) hours as needed for cough.   No facility-administered encounter medications on file as of 08/26/2017.     Activities of Daily Living In your present state of health, do you have any difficulty performing the following activities: 08/26/2017  Hearing? Y  Comment Due to loud noises from previous job. Pt declined hearing aids.   Vision? N  Comment Pt plans to set up an eye exam this year. Pt has yearly eye exams.   Difficulty concentrating or making decisions? N  Walking or climbing stairs? Y  Comment Due to knee pains.   Dressing or bathing? N  Doing errands, shopping? N  Preparing Food and eating ? N  Using the Toilet? N  In the past six months, have you accidently leaked urine? N  Do you have problems with loss of bowel control? N  Managing your Medications? N  Managing your Finances? N  Housekeeping or managing your Housekeeping? N  Some recent data might be hidden    Patient Care Team: Birdie Sons, MD as PCP - General (Family Medicine)   Assessment:   This is a routine wellness examination for Jerry Mcpherson.  Exercise Activities and Dietary recommendations Current Exercise Habits: The patient has a physically strenous job, but has no regular exercise apart from work., Exercise limited by: orthopedic condition(s)  Goals    None       Fall Risk Fall Risk  08/26/2017 08/24/2016 01/28/2016 12/12/2014  Falls in the past year? No No No No   Is the patient's home free of loose throw rugs in walkways, pet beds, electrical cords, etc?   yes      Grab bars in the bathroom? no      Handrails on the stairs?   yes      Adequate lighting?  yes  Timed Get Up and Go Performed: N/A  Depression Screen PHQ 2/9 Scores 08/26/2017 08/26/2017 08/24/2016 08/24/2016  PHQ - 2 Score 0 0 0 0  PHQ- 9 Score 0 - 0 -    Cognitive Function     6CIT Screen 08/26/2017 08/24/2016  What Year? 0 points 0 points  What month? 0 points 0 points  What time? 0 points 0 points  Count back from 20 0 points 0 points  Months in reverse 0 points 0 points  Repeat phrase 0 points 2 points  Total Score 0 2    Immunization History  Administered Date(s) Administered  . Influenza Split 01/01/2009  . Influenza, High Dose Seasonal PF 12/31/2013, 12/12/2014, 01/28/2016, 02/03/2017  . Pneumococcal Conjugate-13 12/31/2013  . Pneumococcal Polysaccharide-23 02/22/2004  . Tdap 10/28/2011  . Zoster 01/28/2009    Qualifies for Shingles Vaccine? Due for Shingles vaccine. Declined my offer to administer today. Education has been provided regarding the importance of this vaccine. Pt has been advised to call her insurance company to determine her out of pocket expense. Advised she may also receive this vaccine at her local pharmacy or Health Dept. Verbalized acceptance and understanding.  Screening Tests Health Maintenance  Topic Date Due  . INFLUENZA VACCINE  11/10/2017  . TETANUS/TDAP  10/27/2021  . PNA vac Low Risk Adult  Completed   Cancer Screenings: Lung: Low Dose CT Chest recommended if Age 48-80 years, 30 pack-year currently smoking OR have quit w/in 15years. Patient does not qualify. Colorectal: Up to date  Additional Screenings:  Hepatitis C Screening: N/A      Plan:  I have personally reviewed and addressed the Medicare Annual Wellness  questionnaire and have noted the following in the patient's chart:  A. Medical and social history B. Use of alcohol, tobacco or illicit drugs  C. Current medications and supplements D. Functional ability and status E.  Nutritional status F.  Physical activity G. Advance directives H. List of other physicians I.  Hospitalizations, surgeries, and ER visits in previous 12 months J.  [place] such as hearing and vision if needed, cognitive and depression L. Referrals and appointments - none  In addition, I have reviewed and discussed with patient certain preventive protocols, quality metrics, and best practice recommendations. A written personalized care plan for preventive services as well as general preventive health recommendations were provided to patient.  See attached scanned questionnaire for additional information.   Signed,  Fabio Neighbors, LPN Nurse Health Advisor   Nurse Recommendations: None.

## 2017-08-26 NOTE — Patient Instructions (Addendum)
[name] , Thank you for taking time to come for your Medicare Wellness Visit. I appreciate your ongoing commitment to your health goals. Please review the following plan we discussed and let me know if I can assist you in the future.   Screening recommendations/referrals: Colonoscopy: Up to date Recommended yearly ophthalmology/optometry visit for glaucoma screening and checkup Recommended yearly dental visit for hygiene and checkup  Vaccinations: Influenza vaccine: Up to date Pneumococcal vaccine: Up to date Tdap vaccine: Up to date Shingles vaccine: Pt declines today.     Advanced directives: Advance directive discussed with you today. Even though you declined this today please call our office should you change your mind and we can give you the proper paperwork for you to fill out.  Conditions/risks identified: Continue current diet plan and avoiding sodas.   Next appointment: 10:20 AM today with Dr Caryn Section.  Preventive Care 5 Years and Older, Male Preventive care refers to lifestyle choices and visits with your health care provider that can promote health and wellness. What does preventive care include?  A yearly physical exam. This is also called an annual well check.  Dental exams once or twice a year.  Routine eye exams. Ask your health care provider how often you should have your eyes checked.  Personal lifestyle choices, including:  Daily care of your teeth and gums.  Regular physical activity.  Eating a healthy diet.  Avoiding tobacco and drug use.  Limiting alcohol use.  Practicing safe sex.  Taking low doses of aspirin every day.  Taking vitamin and mineral supplements as recommended by your health care provider. What happens during an annual well check? The services and screenings done by your health care provider during your annual well check will depend on your age, overall health, lifestyle risk factors, and family history of disease. Counseling    Your health care provider may ask you questions about your:  Alcohol use.  Tobacco use.  Drug use.  Emotional well-being.  Home and relationship well-being.  Sexual activity.  Eating habits.  History of falls.  Memory and ability to understand (cognition).  Work and work Statistician. Screening  You may have the following tests or measurements:  Height, weight, and BMI.  Blood pressure.  Lipid and cholesterol levels. These may be checked every 5 years, or more frequently if you are over 61 years old.  Skin check.  Lung cancer screening. You may have this screening every year starting at age 30 if you have a 30-pack-year history of smoking and currently smoke or have quit within the past 15 years.  Fecal occult blood test (FOBT) of the stool. You may have this test every year starting at age 25.  Flexible sigmoidoscopy or colonoscopy. You may have a sigmoidoscopy every 5 years or a colonoscopy every 10 years starting at age 96.  Prostate cancer screening. Recommendations will vary depending on your family history and other risks.  Hepatitis C blood test.  Hepatitis B blood test.  Sexually transmitted disease (STD) testing.  Diabetes screening. This is done by checking your blood sugar (glucose) after you have not eaten for a while (fasting). You may have this done every 1-3 years.  Abdominal aortic aneurysm (AAA) screening. You may need this if you are a current or former smoker.  Osteoporosis. You may be screened starting at age 80 if you are at high risk. Talk with your health care provider about your test results, treatment options, and if necessary, the need for more  tests. Vaccines  Your health care provider may recommend certain vaccines, such as:  Influenza vaccine. This is recommended every year.  Tetanus, diphtheria, and acellular pertussis (Tdap, Td) vaccine. You may need a Td booster every 10 years.  Zoster vaccine. You may need this after age  53.  Pneumococcal 13-valent conjugate (PCV13) vaccine. One dose is recommended after age 67.  Pneumococcal polysaccharide (PPSV23) vaccine. One dose is recommended after age 11. Talk to your health care provider about which screenings and vaccines you need and how often you need them. This information is not intended to replace advice given to you by your health care provider. Make sure you discuss any questions you have with your health care provider. Document Released: 04/25/2015 Document Revised: 12/17/2015 Document Reviewed: 01/28/2015 Elsevier Interactive Patient Education  2017 [place] Prevention in the Home Falls can cause injuries. They can happen to people of all ages. There are many things you can do to make your home safe and to help prevent falls. What can I do on the outside of my home?  Regularly fix the edges of walkways and driveways and fix any cracks.  Remove anything that might make you trip as you walk through a door, such as a raised step or threshold.  Trim any bushes or trees on the path to your home.  Use bright outdoor lighting.  Clear any walking paths of anything that might make someone trip, such as rocks or tools.  Regularly check to see if handrails are loose or broken. Make sure that both sides of any steps have handrails.  Any raised decks and porches should have guardrails on the edges.  Have any leaves, snow, or ice cleared regularly.  Use sand or salt on walking paths during winter.  Clean up any spills in your garage right away. This includes oil or grease spills. What can I do in the bathroom?  Use night lights.  Install grab bars by the toilet and in the tub and shower. Do not use towel bars as grab bars.  Use non-skid mats or decals in the tub or shower.  If you need to sit down in the shower, use a plastic, non-slip stool.  Keep the floor dry. Clean up any water that spills on the floor as soon as it happens.  Remove  soap buildup in the tub or shower regularly.  Attach bath mats securely with double-sided non-slip rug tape.  Do not have throw rugs and other things on the floor that can make you trip. What can I do in the bedroom?  Use night lights.  Make sure that you have a light by your bed that is easy to reach.  Do not use any sheets or blankets that are too big for your bed. They should not hang down onto the floor.  Have a firm chair that has side arms. You can use this for support while you get dressed.  Do not have throw rugs and other things on the floor that can make you trip. What can I do in the kitchen?  Clean up any spills right away.  Avoid walking on wet floors.  Keep items that you use a lot in easy-to-reach places.  If you need to reach something above you, use a strong step stool that has a grab bar.  Keep electrical cords out of the way.  Do not use floor polish or wax that makes floors slippery. If you must use wax, use non-skid floor  wax.  Do not have throw rugs and other things on the floor that can make you trip. What can I do with my stairs?  Do not leave any items on the stairs.  Make sure that there are handrails on both sides of the stairs and use them. Fix handrails that are broken or loose. Make sure that handrails are as long as the stairways.  Check any carpeting to make sure that it is firmly attached to the stairs. Fix any carpet that is loose or worn.  Avoid having throw rugs at the top or bottom of the stairs. If you do have throw rugs, attach them to the floor with carpet tape.  Make sure that you have a light switch at the top of the stairs and the bottom of the stairs. If you do not have them, ask someone to add them for you. What else can I do to help prevent falls?  Wear shoes that:  Do not have high heels.  Have rubber bottoms.  Are comfortable and fit you well.  Are closed at the toe. Do not wear sandals.  If you use a  stepladder:  Make sure that it is fully opened. Do not climb a closed stepladder.  Make sure that both sides of the stepladder are locked into place.  Ask someone to hold it for you, if possible.  Clearly mark and make sure that you can see:  Any grab bars or handrails.  First and last steps.  Where the edge of each step is.  Use tools that help you move around (mobility aids) if they are needed. These include:  Canes.  Walkers.  Scooters.  Crutches.  Turn on the lights when you go into a dark area. Replace any light bulbs as soon as they burn out.  Set up your furniture so you have a clear path. Avoid moving your furniture around.  If any of your floors are uneven, fix them.  If there are any pets around you, be aware of where they are.  Review your medicines with your doctor. Some medicines can make you feel dizzy. This can increase your chance of falling. Ask your doctor what other things that you can do to help prevent falls. This information is not intended to replace advice given to you by your health care provider. Make sure you discuss any questions you have with your health care provider. Document Released: 01/23/2009 Document Revised: 09/04/2015 Document Reviewed: 05/03/2014 Elsevier Interactive Patient Education  2017 [employer].

## 2017-08-27 LAB — COMPREHENSIVE METABOLIC PANEL
ALT: 14 IU/L (ref 0–44)
AST: 21 IU/L (ref 0–40)
Albumin/Globulin Ratio: 1.8 (ref 1.2–2.2)
Albumin: 4.2 g/dL (ref 3.5–4.8)
Alkaline Phosphatase: 100 IU/L (ref 39–117)
BUN/Creatinine Ratio: 14 (ref 10–24)
BUN: 16 mg/dL (ref 8–27)
Bilirubin Total: 0.8 mg/dL (ref 0.0–1.2)
CO2: 20 mmol/L (ref 20–29)
Calcium: 9.7 mg/dL (ref 8.6–10.2)
Chloride: 105 mmol/L (ref 96–106)
Creatinine, Ser: 1.12 mg/dL (ref 0.76–1.27)
GFR calc Af Amer: 72 mL/min/{1.73_m2} (ref >59)
GFR calc non Af Amer: 63 mL/min/{1.73_m2} (ref >59)
Globulin, Total: 2.4 g/dL (ref 1.5–4.5)
Glucose: 97 mg/dL (ref 65–99)
Potassium: 5.3 mmol/L — ABNORMAL HIGH (ref 3.5–5.2)
Sodium: 142 mmol/L (ref 134–144)
Total Protein: 6.6 g/dL (ref 6.0–8.5)

## 2017-08-27 LAB — LIPID PANEL
Chol/HDL Ratio: 4.5 ratio (ref 0.0–5.0)
Cholesterol, Total: 135 mg/dL (ref 100–199)
HDL: 30 mg/dL — ABNORMAL LOW (ref >39)
LDL Calculated: 68 mg/dL (ref 0–99)
Triglycerides: 183 mg/dL — ABNORMAL HIGH (ref 0–149)
VLDL Cholesterol Cal: 37 mg/dL (ref 5–40)

## 2017-08-27 LAB — PSA: Prostate Specific Ag, Serum: 0.4 ng/mL (ref 0.0–4.0)

## 2017-08-30 ENCOUNTER — Telehealth: Payer: Self-pay | Admitting: Family Medicine

## 2017-08-30 MED ORDER — NAPROXEN 500 MG PO TABS: 500.0000 mg | ORAL_TABLET | Freq: Two times a day (BID) | ORAL | 3 refills | Status: DC

## 2017-08-30 NOTE — Telephone Encounter (Signed)
Please advise 

## 2017-08-30 NOTE — Telephone Encounter (Signed)
Pt was in last week to see Dr. Caryn Section and thought Dr. Caryn Section was going to send in Naproxin.  He went to his pharmacy and nothing has been sent.  He uses [facility] in [place]  His call back is [phone]

## 2017-08-31 ENCOUNTER — Telehealth: Payer: Self-pay

## 2017-08-31 NOTE — Telephone Encounter (Signed)
Patient advised as below.  

## 2017-08-31 NOTE — Telephone Encounter (Signed)
-----   Message from Birdie Sons, MD sent at 08/30/2017  1:51 PM EDT ----- Blood sugar, kidney functions, electrolytes, PSA and cholesterol are all normal. Check labs yearly.

## 2017-09-26 ENCOUNTER — Other Ambulatory Visit: Payer: Self-pay

## 2017-09-26 DIAGNOSIS — M170 Bilateral primary osteoarthritis of knee: Secondary | ICD-10-CM

## 2017-09-26 MED ORDER — OXYCODONE-ACETAMINOPHEN 7.5-325 MG PO TABS: 1.0000 | ORAL_TABLET | Freq: Three times a day (TID) | ORAL | 0 refills | Status: DC | PRN

## 2017-10-12 ENCOUNTER — Other Ambulatory Visit: Payer: Self-pay | Admitting: Family Medicine

## 2017-10-12 DIAGNOSIS — M170 Bilateral primary osteoarthritis of knee: Secondary | ICD-10-CM

## 2017-10-12 MED ORDER — OXYCODONE-ACETAMINOPHEN 7.5-325 MG PO TABS: 1.0000 | ORAL_TABLET | Freq: Three times a day (TID) | ORAL | 0 refills | Status: DC | PRN

## 2017-10-12 NOTE — Telephone Encounter (Signed)
Pt needs refill on his oxycodone   He uses [facility] in [place]

## 2017-10-25 ENCOUNTER — Other Ambulatory Visit: Payer: Self-pay | Admitting: Family Medicine

## 2017-10-25 DIAGNOSIS — M170 Bilateral primary osteoarthritis of knee: Secondary | ICD-10-CM

## 2017-10-25 MED ORDER — OXYCODONE-ACETAMINOPHEN 7.5-325 MG PO TABS: 1.0000 | ORAL_TABLET | Freq: Three times a day (TID) | ORAL | 0 refills | Status: DC | PRN

## 2017-10-25 NOTE — Telephone Encounter (Signed)
Pt contacted office for refill request on the following medications:  oxyCODONE-acetaminophen (PERCOCET) 7.5-325 MG tablet  [facility] [place]  Last Rx: 10/12/17 LOV: 08/26/17 Please advise. Thanks TNP

## 2017-11-06 ENCOUNTER — Other Ambulatory Visit: Payer: Self-pay | Admitting: Family Medicine

## 2017-11-07 ENCOUNTER — Other Ambulatory Visit: Payer: Self-pay | Admitting: Family Medicine

## 2017-11-07 DIAGNOSIS — M170 Bilateral primary osteoarthritis of knee: Secondary | ICD-10-CM

## 2017-11-07 MED ORDER — OXYCODONE-ACETAMINOPHEN 7.5-325 MG PO TABS: 1.0000 | ORAL_TABLET | Freq: Three times a day (TID) | ORAL | 0 refills | Status: DC | PRN

## 2017-11-07 NOTE — Telephone Encounter (Signed)
Pt Needs refill on his oxycodone 7.5-325    He uses [facility] in [place], [address] [place]

## 2017-11-21 ENCOUNTER — Other Ambulatory Visit: Payer: Self-pay

## 2017-11-21 DIAGNOSIS — M170 Bilateral primary osteoarthritis of knee: Secondary | ICD-10-CM

## 2017-11-21 MED ORDER — OXYCODONE-ACETAMINOPHEN 7.5-325 MG PO TABS: 1.0000 | ORAL_TABLET | Freq: Three times a day (TID) | ORAL | 0 refills | Status: DC | PRN

## 2017-11-21 NOTE — Telephone Encounter (Signed)
Patient is requesting a refill on oxyCODONE-acetaminophen (PERCOCET) 7.5-325 MG tablet be sent to [facility] [facility] in [place]. Last RF 11/07/17. CB# [phone]

## 2017-11-30 ENCOUNTER — Other Ambulatory Visit: Payer: Self-pay | Admitting: Family Medicine

## 2017-11-30 DIAGNOSIS — M170 Bilateral primary osteoarthritis of knee: Secondary | ICD-10-CM

## 2017-11-30 MED ORDER — OXYCODONE-ACETAMINOPHEN 7.5-325 MG PO TABS: 1.0000 | ORAL_TABLET | Freq: Three times a day (TID) | ORAL | 0 refills | Status: DC | PRN

## 2017-11-30 NOTE — Telephone Encounter (Signed)
Pt needs a refill on his oxycodone 7.5-325  He is leaving to go out of town tomorrow and wants to know if he can get it filled before he leaves  [facility] [place]  Thanks [employer]

## 2017-12-04 ENCOUNTER — Other Ambulatory Visit: Payer: Self-pay | Admitting: Family Medicine

## 2017-12-13 ENCOUNTER — Other Ambulatory Visit: Payer: Self-pay | Admitting: Family Medicine

## 2017-12-13 DIAGNOSIS — M170 Bilateral primary osteoarthritis of knee: Secondary | ICD-10-CM

## 2017-12-13 MED ORDER — OXYCODONE-ACETAMINOPHEN 7.5-325 MG PO TABS: 1.0000 | ORAL_TABLET | Freq: Three times a day (TID) | ORAL | 0 refills | Status: DC | PRN
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
## 2017-12-14 ENCOUNTER — Telehealth: Payer: Self-pay | Admitting: Family Medicine

## 2017-12-14 ENCOUNTER — Ambulatory Visit (INDEPENDENT_AMBULATORY_CARE_PROVIDER_SITE_OTHER): Payer: Medicare Other

## 2017-12-14 DIAGNOSIS — Z23 Encounter for immunization: Secondary | ICD-10-CM

## 2017-12-14 NOTE — Telephone Encounter (Signed)
Please advise 

## 2017-12-14 NOTE — Telephone Encounter (Signed)
Patient wants to know if  You will increase his Percocet "just a litte bit" .  He is here for a flu shot and wants to know if he can "see you" about this.

## 2017-12-19 NOTE — Telephone Encounter (Signed)
Needs office visit if pain medications are not working.

## 2017-12-19 NOTE — Telephone Encounter (Signed)
Pt advised apt made for 12/21/2017 at 4pm  Thanks,   -laura

## 2017-12-21 ENCOUNTER — Ambulatory Visit (INDEPENDENT_AMBULATORY_CARE_PROVIDER_SITE_OTHER): Payer: Medicare Other | Admitting: Family Medicine

## 2017-12-21 ENCOUNTER — Encounter: Payer: Self-pay | Admitting: Family Medicine

## 2017-12-21 DIAGNOSIS — M170 Bilateral primary osteoarthritis of knee: Secondary | ICD-10-CM | POA: Diagnosis not present

## 2017-12-21 MED ORDER — OXYCODONE-ACETAMINOPHEN 7.5-325 MG PO TABS: 1.0000 | ORAL_TABLET | Freq: Four times a day (QID) | ORAL | 0 refills | Status: DC | PRN

## 2017-12-21 NOTE — Progress Notes (Signed)
Patient: Jerry Mcpherson Male    DOB: 1941-01-27   77 y.o.   MRN: [id number] Visit Date: 12/21/2017  Today's Provider: Lelon Huh, MD   Chief Complaint  Patient presents with  . Pain   Subjective:    HPI Follow up of Chronic pain/ Osteoarthritis of both knees: Patient was last seen for this problem months ago and no changes were made. Patient was doing well with topical NSAID and as needed Percocet. Today patient comes in requesting an increase of his Percocet. He states his knee pain has slightly worsened since last office visit. He states he takes oxycodone/apap twice a day every day and sometimes a third. States medication works well, but sometimes when he is more active the pain is much worse and medications is not strong enough or wears off too soon. He is using diclofenac get consistently.   He states he was followed at [facility] several years ago and advised he needs knee replacement. He had steroid injection which only helped for a short period of time. He doesn't think he every had HUA injections.     Allergies  Allergen Reactions  . Codeine     Heart racing  . Cozaar [Losartan Potassium]     angioedema  . Cyclobenzaprine     Musculoskeletal therapy agents cause Excessive sedation  . Shellfish Allergy     Rash, itching, swelling  . Ultram  [Tramadol Hcl] Nausea And Vomiting  . Celebrex [Celecoxib] Itching, Swelling and Rash    Cannot take when eating shellfish     Current Outpatient Medications:  .  acetaminophen (TYLENOL) 500 MG tablet, Take 2 tablets (1,000 mg total) by mouth every 6 (six) hours as needed for mild pain or moderate pain., Disp: 100 tablet, Rfl: 0 .  amLODipine (NORVASC) 5 MG tablet, TAKE 1 TABLET BY MOUTH EVERY DAY, Disp: 90 tablet, Rfl: 4 .  Ascorbic Acid (VITAMIN C) 1000 MG tablet, Take 2 tablets by mouth daily., Disp: , Rfl:  .  aspirin 81 MG tablet, Take 1 tablet by mouth daily., Disp: , Rfl:  .  diclofenac sodium  (VOLTAREN) 1 % GEL, Apply 4 g topically 4 (four) times daily., Disp: 100 g, Rfl: 5 .  docusate sodium (COLACE) 100 MG capsule, Take 1 capsule by mouth daily as needed., Disp: , Rfl:  .  Multiple Vitamins-Minerals (MULTIVITAMIN ADULT PO), Take 1 tablet by mouth daily., Disp: , Rfl:  .  naproxen (NAPROSYN) 500 MG tablet, TAKE 1 TABLET(500 MG) BY MOUTH TWICE DAILY WITH A MEAL, Disp: 30 tablet, Rfl: 5 .  oxyCODONE-acetaminophen (PERCOCET) 7.5-325 MG tablet, Take 1 tablet by mouth every 8 (eight) hours as needed for severe pain., Disp: 30 tablet, Rfl: 0 .  sildenafil (VIAGRA) 50 MG tablet, Take 0.5-1 tablets by mouth daily as needed., Disp: , Rfl:  .  UNABLE TO FIND, Med Name: tumeric 500mg  twice a day, Disp: , Rfl:  .  valACYclovir (VALTREX) 500 MG tablet, TAKE 1 TABLET BY MOUTH TWICE DAILY FOR 3 DAYS AS NEEDED, Disp: 6 tablet, Rfl: 3  Review of Systems  Constitutional: Negative for appetite change, chills and fever.  Respiratory: Negative for chest tightness, shortness of breath and wheezing.   Cardiovascular: Negative for chest pain and palpitations.  Gastrointestinal: Negative for abdominal pain, nausea and vomiting.  Musculoskeletal: Positive for arthralgias.    Social History   Tobacco Use  . Smoking status: Former Smoker    Packs/day: 3.00  Years: 15.00    Pack years: 45.00    Types: Cigarettes    Last attempt to quit: 04/12/1968    Years since quitting: 49.7  . Smokeless tobacco: Never Used  Substance Use Topics  . Alcohol use: No    Alcohol/week: 0.0 standard drinks   Objective:   BP 130/70 (BP Location: Right Arm, Patient Position: Sitting, Cuff Size: Normal)   Pulse 83   Temp (!) 97.5 F (36.4 C) (Oral)   Resp 16   Wt 172 lb (78 kg)   SpO2 95% Comment: room air  BMI 23.33 kg/m  Vitals:   12/21/17 1607  BP: 130/70  Pulse: 83  Resp: 16  Temp: (!) 97.5 F (36.4 C)  TempSrc: Oral  SpO2: 95%  Weight: 172 lb (78 kg)     Physical Exam  General appearance:  alert, well developed, well nourished, cooperative and in no distress Head: Normocephalic, without obvious abnormality, atraumatic Respiratory: Respirations even and unlabored, normal respiratory rate Extremities: Mild tenderness, no swelling or erythema, along joint lines of both knees.      Assessment & Plan:     1. Primary osteoarthritis of both knees oxycodone/apap usually effective, but not always. Discusses increasing to 10mg  versus allowing him to take extra tablet on occasion. Will change frequency to q 6 prn (up to 4 tablets a day) - oxyCODONE-acetaminophen (PERCOCET) 7.5-325 MG tablet; Take 1 tablet by mouth every 6 (six) hours as needed for severe pain.  Dispense: 90 tablet; Refill: 0  Discussed returning to orthopedics to discus HUA injections which he will consider.        Lelon Huh, MD  [facility]

## 2018-01-16 ENCOUNTER — Other Ambulatory Visit: Payer: Self-pay | Admitting: Family Medicine

## 2018-01-16 DIAGNOSIS — M170 Bilateral primary osteoarthritis of knee: Secondary | ICD-10-CM

## 2018-01-16 MED ORDER — OXYCODONE-ACETAMINOPHEN 7.5-325 MG PO TABS: 1.0000 | ORAL_TABLET | Freq: Four times a day (QID) | ORAL | 0 refills | Status: DC | PRN

## 2018-01-16 NOTE — Telephone Encounter (Signed)
Pt contacted office for refill request on the following medications:  oxyCODONE-acetaminophen (PERCOCET) 7.5-325 MG tablet     [place], [address] AT [place] [phone] (Phone) [phone] (Fax)  Last Rx: 12/21/17 LOV: 12/21/17 Please advise. Thanks TNP

## 2018-01-23 ENCOUNTER — Ambulatory Visit (INDEPENDENT_AMBULATORY_CARE_PROVIDER_SITE_OTHER): Payer: Medicare Other | Admitting: Family Medicine

## 2018-01-23 ENCOUNTER — Encounter: Payer: Self-pay | Admitting: Family Medicine

## 2018-01-23 VITALS — BP 137/71 | HR 88 | Temp 98.4°F | Resp 16 | Wt 173.0 lb

## 2018-01-23 DIAGNOSIS — J40 Bronchitis, not specified as acute or chronic: Secondary | ICD-10-CM | POA: Diagnosis not present

## 2018-01-23 MED ORDER — AZITHROMYCIN 250 MG PO TABS: ORAL_TABLET | ORAL | 0 refills | Status: AC

## 2018-01-23 NOTE — Progress Notes (Signed)
Patient: Jerry Mcpherson Male    DOB: 10-09-1940   77 y.o.   MRN: [id number] Visit Date: 01/23/2018  Today's Provider: Lelon Huh, MD   Chief Complaint  Patient presents with  . URI    x 2 days   Subjective:    URI   This is a new problem. Episode onset: 2 days ago. The problem has been gradually worsening. Associated symptoms include congestion, coughing, headaches, a plugged ear sensation, rhinorrhea, sinus pain, sneezing and a sore throat. Pertinent negatives include no abdominal pain, chest pain, ear pain, nausea, vomiting or wheezing. Diarrhea: productive with green mucus. He has tried nothing for the symptoms.   States has been up all night with chills and sweats.     Allergies  Allergen Reactions  . Codeine     Heart racing  . Cozaar [Losartan Potassium]     angioedema  . Cyclobenzaprine     Musculoskeletal therapy agents cause Excessive sedation  . Shellfish Allergy     Rash, itching, swelling  . Ultram  [Tramadol Hcl] Nausea And Vomiting  . Celebrex [Celecoxib] Itching, Swelling and Rash    Cannot take when eating shellfish     Current Outpatient Medications:  .  acetaminophen (TYLENOL) 500 MG tablet, Take 2 tablets (1,000 mg total) by mouth every 6 (six) hours as needed for mild pain or moderate pain., Disp: 100 tablet, Rfl: 0 .  amLODipine (NORVASC) 5 MG tablet, TAKE 1 TABLET BY MOUTH EVERY DAY, Disp: 90 tablet, Rfl: 4 .  Ascorbic Acid (VITAMIN C) 1000 MG tablet, Take 2 tablets by mouth daily., Disp: , Rfl:  .  aspirin 81 MG tablet, Take 1 tablet by mouth daily., Disp: , Rfl:  .  diclofenac sodium (VOLTAREN) 1 % GEL, Apply 4 g topically 4 (four) times daily., Disp: 100 g, Rfl: 5 .  docusate sodium (COLACE) 100 MG capsule, Take 1 capsule by mouth daily as needed., Disp: , Rfl:  .  Multiple Vitamins-Minerals (MULTIVITAMIN ADULT PO), Take 1 tablet by mouth daily., Disp: , Rfl:  .  naproxen (NAPROSYN) 500 MG tablet, TAKE 1 TABLET(500 MG) BY MOUTH  TWICE DAILY WITH A MEAL, Disp: 30 tablet, Rfl: 5 .  oxyCODONE-acetaminophen (PERCOCET) 7.5-325 MG tablet, Take 1 tablet by mouth every 6 (six) hours as needed for severe pain., Disp: 90 tablet, Rfl: 0 .  sildenafil (VIAGRA) 50 MG tablet, Take 0.5-1 tablets by mouth daily as needed., Disp: , Rfl:  .  UNABLE TO FIND, Med Name: tumeric 500mg  twice a day, Disp: , Rfl:  .  valACYclovir (VALTREX) 500 MG tablet, TAKE 1 TABLET BY MOUTH TWICE DAILY FOR 3 DAYS AS NEEDED, Disp: 6 tablet, Rfl: 3  Review of Systems  Constitutional: Positive for chills and diaphoresis. Negative for appetite change and fever.  HENT: Positive for congestion, postnasal drip, rhinorrhea, sinus pressure, sinus pain, sneezing and sore throat. Negative for ear pain.   Eyes: Positive for discharge and itching.  Respiratory: Positive for cough. Negative for chest tightness, shortness of breath and wheezing.   Cardiovascular: Negative for chest pain and palpitations.  Gastrointestinal: Negative for abdominal pain, nausea and vomiting. Diarrhea: productive with green mucus.  Neurological: Positive for headaches.    Social History   Tobacco Use  . Smoking status: Former Smoker    Packs/day: 3.00    Years: 15.00    Pack years: 45.00    Types: Cigarettes    Last attempt to quit: 04/12/1968  Years since quitting: 49.8  . Smokeless tobacco: Never Used  Substance Use Topics  . Alcohol use: No    Alcohol/week: 0.0 standard drinks   Objective:   BP 137/71 (BP Location: Right Arm, Patient Position: Sitting, Cuff Size: Large)   Pulse 88   Temp 98.4 F (36.9 C) (Oral)   Resp 16   Wt 173 lb (78.5 kg)   SpO2 95% Comment: room air  BMI 23.46 kg/m  Vitals:   01/23/18 1042  BP: 137/71  Pulse: 88  Resp: 16  Temp: 98.4 F (36.9 C)  TempSrc: Oral  SpO2: 95%  Weight: 173 lb (78.5 kg)     Physical Exam  General Appearance:    Alert, cooperative, no distress  HENT:   bilateral TM normal without fluid or infection, neck  without nodes, sinuses nontender, post nasal drip noted and nasal mucosa pale and congested  Eyes:    PERRL, conjunctiva/corneas clear, EOM's intact       Lungs:     Occasional wheezing, no rales or rhonchi, respirations unlabored  Heart:    Regular rate and rhythm  Neurologic:   Awake, alert, oriented x 3. No apparent focal neurological           defect.           Assessment & Plan:     1. Bronchitis   - azithromycin (ZITHROMAX) 250 MG tablet; 2 by mouth today, then 1 daily for 4 days  Dispense: 6 tablet; Refill: 0 Call if symptoms change or if not rapidly improving.       Lelon Huh, MD  [facility]

## 2018-02-02 ENCOUNTER — Other Ambulatory Visit: Payer: Self-pay | Admitting: Family Medicine

## 2018-02-02 DIAGNOSIS — M170 Bilateral primary osteoarthritis of knee: Secondary | ICD-10-CM

## 2018-02-02 MED ORDER — OXYCODONE-ACETAMINOPHEN 7.5-325 MG PO TABS: 1.0000 | ORAL_TABLET | Freq: Four times a day (QID) | ORAL | 0 refills | Status: DC | PRN

## 2018-02-02 NOTE — Telephone Encounter (Signed)
Pt contacted office for refill request on the following medications:  oxyCODONE-acetaminophen (PERCOCET) 7.5-325 MG tablet   [facility] [place]  Pt is requesting to be able to pick up medication Tuesday or Wednesday next week because he will be leaving to go out of town for a funeral. Please advise. Thanks TNP

## 2018-02-02 NOTE — Telephone Encounter (Signed)
Please advise 

## 2018-02-03 ENCOUNTER — Telehealth: Payer: Self-pay | Admitting: Family Medicine

## 2018-02-03 NOTE — Telephone Encounter (Signed)
Pt calling back since he is asking if it can be done today.  He is leaving to go out of town for a funeral.  Administrator, Civil Service, [facility]

## 2018-02-03 NOTE — Telephone Encounter (Signed)
Called pharmacy,  Pt advised.   Thanks,   -[name]

## 2018-02-03 NOTE — Telephone Encounter (Signed)
Please advise pharmacy ok to dispense oxycodone/apap today.

## 2018-02-03 NOTE — Telephone Encounter (Signed)
Pt stated that he has had to change his travel plans for the funeral and is having to leave today. Pt is requesting that we contact [facility] to advise ok to fill the Rx for oxyCODONE-acetaminophen (PERCOCET) 7.5-325 MG tablet today so pt can get the medication before leaving town because he will run out of the medication before he returns unless he gets the medication early. Please advise. Thanks TNP

## 2018-02-07 ENCOUNTER — Telehealth: Payer: Self-pay | Admitting: Family Medicine

## 2018-02-07 DIAGNOSIS — H9209 Otalgia, unspecified ear: Secondary | ICD-10-CM

## 2018-02-07 NOTE — Telephone Encounter (Signed)
Pt stated he needs a referral to [facility]. Pt stated that he has seen Dr. Caryn Section about this issue in the past and that Dr. Caryn Section would know why he needs the referral. Please advise. Thanks TNP

## 2018-02-07 NOTE — Telephone Encounter (Signed)
You last saw patient in the office on 01/23/18 for bronchitis, please review chart and advise. KW

## 2018-02-07 NOTE — Telephone Encounter (Signed)
Patient states he is having inter ear  problem again and tried to schedule appointment with ENT his self but they states that he needs a referral from you.

## 2018-02-07 NOTE — Telephone Encounter (Signed)
What is is he wanting referral to ENT for?

## 2018-02-25 ENCOUNTER — Other Ambulatory Visit: Payer: Self-pay | Admitting: Family Medicine

## 2018-02-25 DIAGNOSIS — M170 Bilateral primary osteoarthritis of knee: Secondary | ICD-10-CM

## 2018-02-25 MED ORDER — OXYCODONE-ACETAMINOPHEN 7.5-325 MG PO TABS: 1.0000 | ORAL_TABLET | Freq: Four times a day (QID) | ORAL | 0 refills | Status: DC | PRN

## 2018-02-25 NOTE — Telephone Encounter (Signed)
Patient is requesting refill on the following medication  oxyCODONE-acetaminophen (PERCOCET) 7.5-325 MG tablet  He uses [facility] in [place]

## 2018-03-13 ENCOUNTER — Other Ambulatory Visit: Payer: Self-pay | Admitting: Otolaryngology

## 2018-03-13 ENCOUNTER — Telehealth: Payer: Self-pay | Admitting: Family Medicine

## 2018-03-13 DIAGNOSIS — R42 Dizziness and giddiness: Secondary | ICD-10-CM

## 2018-03-13 NOTE — Telephone Encounter (Signed)
Pt went to ENT and basically couldn't find anything wrong.   Dr. Richardson Landry wants him to have a MRI at [facility] but patient does not want to go to [facility] and has told Dr. Richardson Landry that he wanted to go to [facility] for MRI or at [facility]. However, he cannot get in touch with Dr. Richardson Landry and no one will call him back from there.  So patient wants to know if there is anything we can do to help him get the MRI scheduled.

## 2018-03-15 NOTE — Telephone Encounter (Signed)
It looks like Dr. Richardson Landry has already scheduled patient for an MRI on 03/24/2018. According to Dr. Katherina Right notes, patient would need referral to [facility] if he wanted MRI there.

## 2018-03-15 NOTE — Telephone Encounter (Signed)
I called and advised patient as below. Patient was aware of the appointment on 03/24/2018 in [place], and he is fine having the MRI there. Patient wanted to let Dr. Caryn Section know that he also has a Neurology appointment with [place] on 05/11/2017 (he believes this was set up by Dr. Reola Mosher office).

## 2018-03-22 ENCOUNTER — Other Ambulatory Visit: Payer: Self-pay | Admitting: Family Medicine

## 2018-03-22 DIAGNOSIS — M170 Bilateral primary osteoarthritis of knee: Secondary | ICD-10-CM

## 2018-03-22 MED ORDER — OXYCODONE-ACETAMINOPHEN 7.5-325 MG PO TABS: 1.0000 | ORAL_TABLET | Freq: Four times a day (QID) | ORAL | 0 refills | Status: DC | PRN

## 2018-03-22 NOTE — Telephone Encounter (Signed)
Patient is requesting refills on Oxycodone Acet 7.5-325 mg. Sent to [facility] in [place].

## 2018-03-24 ENCOUNTER — Ambulatory Visit
Admission: RE | Admit: 2018-03-24 | Discharge: 2018-03-24 | Disposition: A | Discharge status: Home / Self Care | Payer: Medicare Other | Source: Ambulatory Visit | Attending: Otolaryngology | Admitting: Otolaryngology

## 2018-03-24 ENCOUNTER — Encounter (INDEPENDENT_AMBULATORY_CARE_PROVIDER_SITE_OTHER): Payer: Self-pay

## 2018-03-24 DIAGNOSIS — Z8673 Personal history of transient ischemic attack (TIA), and cerebral infarction without residual deficits: Secondary | ICD-10-CM | POA: Insufficient documentation

## 2018-03-24 DIAGNOSIS — R9089 Other abnormal findings on diagnostic imaging of central nervous system: Secondary | ICD-10-CM | POA: Insufficient documentation

## 2018-03-24 DIAGNOSIS — R42 Dizziness and giddiness: Secondary | ICD-10-CM | POA: Insufficient documentation

## 2018-04-10 ENCOUNTER — Telehealth: Payer: Self-pay | Admitting: Family Medicine

## 2018-04-10 MED ORDER — AMOXICILLIN 500 MG PO CAPS: 1000.0000 mg | ORAL_CAPSULE | Freq: Two times a day (BID) | ORAL | 0 refills | Status: AC

## 2018-04-10 NOTE — Telephone Encounter (Signed)
Telephone report with patient's symptoms placed on you desk to review. Please advise.

## 2018-04-10 NOTE — Telephone Encounter (Signed)
Have sent prescription for amoxicillin for sinus infection

## 2018-04-10 NOTE — Telephone Encounter (Signed)
Antibiotics don't help with colds. What are his symptoms? 

## 2018-04-10 NOTE — Telephone Encounter (Signed)
Patient was advised.  

## 2018-04-10 NOTE — Telephone Encounter (Signed)
Pt needing an antibiotic for cold symptoms.  He spoke with a [place] over the weekend.  He was asking if Dr. Caryn Section could call this in for him?  Pt was advised that he could not be treated over the phone. He still wanted to ask.  Please advise.  Thanks, [employer]

## 2018-04-17 ENCOUNTER — Other Ambulatory Visit: Payer: Self-pay

## 2018-04-17 DIAGNOSIS — M170 Bilateral primary osteoarthritis of knee: Secondary | ICD-10-CM

## 2018-04-17 MED ORDER — OXYCODONE-ACETAMINOPHEN 7.5-325 MG PO TABS: 1.0000 | ORAL_TABLET | Freq: Four times a day (QID) | ORAL | 0 refills | Status: DC | PRN

## 2018-05-15 ENCOUNTER — Ambulatory Visit: Payer: Medicare Other | Admitting: Family Medicine

## 2018-05-15 ENCOUNTER — Other Ambulatory Visit: Payer: Self-pay | Admitting: Family Medicine

## 2018-05-15 ENCOUNTER — Encounter: Payer: Self-pay | Admitting: Family Medicine

## 2018-05-15 ENCOUNTER — Ambulatory Visit (INDEPENDENT_AMBULATORY_CARE_PROVIDER_SITE_OTHER): Payer: Medicare Other | Admitting: Family Medicine

## 2018-05-15 VITALS — BP 112/60 | HR 88 | Temp 97.7°F | Resp 16 | Wt 170.0 lb

## 2018-05-15 DIAGNOSIS — D473 Essential (hemorrhagic) thrombocythemia: Secondary | ICD-10-CM | POA: Diagnosis not present

## 2018-05-15 DIAGNOSIS — D72829 Elevated white blood cell count, unspecified: Secondary | ICD-10-CM | POA: Insufficient documentation

## 2018-05-15 DIAGNOSIS — D45 Polycythemia vera: Secondary | ICD-10-CM | POA: Insufficient documentation

## 2018-05-15 DIAGNOSIS — D751 Secondary polycythemia: Secondary | ICD-10-CM

## 2018-05-15 DIAGNOSIS — M170 Bilateral primary osteoarthritis of knee: Secondary | ICD-10-CM

## 2018-05-15 DIAGNOSIS — D75839 Thrombocytosis, unspecified: Secondary | ICD-10-CM

## 2018-05-15 MED ORDER — ASPIRIN 81 MG PO TABS: ORAL_TABLET | ORAL | Status: DC

## 2018-05-15 MED ORDER — OXYCODONE-ACETAMINOPHEN 7.5-325 MG PO TABS: 1.0000 | ORAL_TABLET | Freq: Four times a day (QID) | ORAL | 0 refills | Status: DC | PRN

## 2018-05-15 NOTE — Telephone Encounter (Signed)
Pt needing a refill on: °oxyCODONE-acetaminophen (PERCOCET) 7.5-325 MG tablet ° °Please fill at: °[id number] - [place], [place] - [address] [place] [address] [place] [address] [phone] (Phone) °[phone] (Fax)  ° °Thanks, °[facility] °

## 2018-05-15 NOTE — Progress Notes (Signed)
Patient: Jerry Mcpherson Male    DOB: 02/23/1941   78 y.o.   MRN: [id number] Visit Date: 05/15/2018  Today's Provider: Lelon Huh, MD   Chief Complaint  Patient presents with  . Abnormal Lab   Subjective:     Abnormal Lab  This is a new problem. Episode onset: Patient recently had labs ordered by Dr. Doy Mince [facility]) which showed an elevated Platelet count. Pertinent negatives include no abdominal pain, chest pain, chills, fever, nausea or vomiting.  He was referred to neurologist by the recommendation of his ENT Dr. Richardson Landry for episodes of dizziness and visual disturbance. He labs done by neurologist with findings of WBC=16.3, RBC =8.38, and platelets of 810. Otherwise he feels well. He got a call this morning that he is being referred to [facility] and states he is having some type of brain scan scheduled at [facility] [address] later this week. .   Allergies  Allergen Reactions  . Codeine     Heart racing  . Cozaar [Losartan Potassium]     angioedema  . Cyclobenzaprine     Musculoskeletal therapy agents cause Excessive sedation  . Shellfish Allergy     Rash, itching, swelling  . Ultram  [Tramadol Hcl] Nausea And Vomiting  . Celebrex [Celecoxib] Itching, Swelling and Rash    Cannot take when eating shellfish     Current Outpatient Medications:  .  acetaminophen (TYLENOL) 500 MG tablet, Take 2 tablets (1,000 mg total) by mouth every 6 (six) hours as needed for mild pain or moderate pain., Disp: 100 tablet, Rfl: 0 .  amLODipine (NORVASC) 5 MG tablet, TAKE 1 TABLET BY MOUTH EVERY DAY, Disp: 90 tablet, Rfl: 4 .  Ascorbic Acid (VITAMIN C) 1000 MG tablet, Take 2 tablets by mouth daily., Disp: , Rfl:  .  aspirin 81 MG tablet, Take 1 tablet by mouth daily., Disp: , Rfl:  .  diclofenac sodium (VOLTAREN) 1 % GEL, Apply 4 g topically 4 (four) times daily., Disp: 100 g, Rfl: 5 .  docusate sodium (COLACE) 100 MG capsule, Take 1 capsule by mouth daily as needed., Disp: , Rfl:  .  Multiple  Vitamins-Minerals (MULTIVITAMIN ADULT PO), Take 1 tablet by mouth daily., Disp: , Rfl:  .  naproxen (NAPROSYN) 500 MG tablet, TAKE 1 TABLET(500 MG) BY MOUTH TWICE DAILY WITH A MEAL, Disp: 30 tablet, Rfl: 5 .  oxyCODONE-acetaminophen (PERCOCET) 7.5-325 MG tablet, Take 1 tablet by mouth every 6 (six) hours as needed for severe pain., Disp: 90 tablet, Rfl: 0 .  sildenafil (VIAGRA) 50 MG tablet, Take 0.5-1 tablets by mouth daily as needed., Disp: , Rfl:  .  UNABLE TO FIND, Med Name: tumeric 500mg  twice a day, Disp: , Rfl:  .  valACYclovir (VALTREX) 500 MG tablet, TAKE 1 TABLET BY MOUTH TWICE DAILY FOR 3 DAYS AS NEEDED, Disp: 6 tablet, Rfl: 3  Review of Systems  Constitutional: Negative for appetite change, chills and fever.  Respiratory: Negative for chest tightness, shortness of breath and wheezing.   Cardiovascular: Negative for chest pain and palpitations.  Gastrointestinal: Negative for abdominal pain, nausea and vomiting.    Social History   Tobacco Use  . Smoking status: Former Smoker    Packs/day: 3.00    Years: 15.00    Pack years: 45.00    Types: Cigarettes    Last attempt to quit: 04/12/1968    Years since quitting: 50.1  . Smokeless tobacco: Never Used  Substance Use Topics  .  Alcohol use: No    Alcohol/week: 0.0 standard drinks      Objective:   BP 112/60 (BP Location: Left Arm, Patient Position: Sitting, Cuff Size: Normal)   Pulse 88   Temp 97.7 F (36.5 C) (Oral)   Resp 16   Wt 170 lb (77.1 kg)   SpO2 94% Comment: room air  BMI 23.06 kg/m  Vitals:   05/15/18 1422  BP: 112/60  Pulse: 88  Resp: 16  Temp: 97.7 F (36.5 C)  TempSrc: Oral  SpO2: 94%  Weight: 170 lb (77.1 kg)     Physical Exam  General appearance: alert, well developed, well nourished, cooperative and in no distress Head: Normocephalic, without obvious abnormality, atraumatic Respiratory: Respirations even and unlabored, normal respiratory rate Extremities: No gross deformities Skin: Skin  color, texture, turgor normal. No rashes seen  Psych: Appropriate mood and affect. Neurologic: Mental status: Alert, oriented to person, place, and time, thought content appropriate.     Assessment & Plan    1. Polycythemia  - CBC with Differential/Platelet  2. Leukocytosis, unspecified type   3. Thrombocytosis [facility])  [facility] hematology has already been initiated. Symptoms are worrisome for TIA. Discussed increased ECASA to 2 81mg  tablets daily. He states neurology talked about adding clopidogrel, but has not prescribed yet.       Lelon Huh, MD  [facility]

## 2018-05-15 NOTE — Patient Instructions (Signed)
.   Please review the attached list of medications and notify my office if there are any errors.   . Start taking 2  81mg  aspirin tablets daily instead of 1.

## 2018-05-16 ENCOUNTER — Telehealth: Payer: Self-pay

## 2018-05-16 DIAGNOSIS — D75839 Thrombocytosis, unspecified: Secondary | ICD-10-CM

## 2018-05-16 DIAGNOSIS — D72829 Elevated white blood cell count, unspecified: Secondary | ICD-10-CM

## 2018-05-16 DIAGNOSIS — D751 Secondary polycythemia: Secondary | ICD-10-CM

## 2018-05-16 DIAGNOSIS — D473 Essential (hemorrhagic) thrombocythemia: Secondary | ICD-10-CM

## 2018-05-16 LAB — CBC WITH DIFFERENTIAL/PLATELET
Basophils Absolute: 0.2 10*3/uL (ref 0.0–0.2)
Basos: 1 %
EOS (ABSOLUTE): 0.5 10*3/uL — ABNORMAL HIGH (ref 0.0–0.4)
Eos: 3 %
HEMATOCRIT: 56.9 % — AB (ref 37.5–51.0)
HEMOGLOBIN: 18.9 g/dL — AB (ref 13.0–17.7)
Immature Grans (Abs): 0.2 10*3/uL — ABNORMAL HIGH (ref 0.0–0.1)
Immature Granulocytes: 1 %
Lymphocytes Absolute: 1.6 10*3/uL (ref 0.7–3.1)
Lymphs: 9 %
MCH: 24.5 pg — ABNORMAL LOW (ref 26.6–33.0)
MCHC: 33.2 g/dL (ref 31.5–35.7)
MCV: 74 fL — ABNORMAL LOW (ref 79–97)
MONOCYTES: 6 %
Monocytes Absolute: 1 10*3/uL — ABNORMAL HIGH (ref 0.1–0.9)
Neutrophils Absolute: 13.8 10*3/uL — ABNORMAL HIGH (ref 1.4–7.0)
Neutrophils: 80 %
Platelets: 866 10*3/uL (ref 150–450)
RBC: 7.7 x10E6/uL — AB (ref 4.14–5.80)
RDW: 21.6 % — ABNORMAL HIGH (ref 11.6–15.4)
WBC: 17.3 10*3/uL — ABNORMAL HIGH (ref 3.4–10.8)

## 2018-05-16 NOTE — Telephone Encounter (Signed)
Patient advised of results. He says that [facility] called him yesterday. He told [facility] that his PCP has ordered  labs, and they advised him to wait until the results came back first. Patient says they didn't schedule an appointment, and he no longer has their phone number to call back. I advised patient to try calling Dr. Beverly Milch office to get the number of the Hematologist that called yesterday. I advised patient to call me back today to inform me of whether the Hematologist was able to work him in this week. If not, then we would refer to hematologist in [place]. Awaiting patient's return call.

## 2018-05-16 NOTE — Telephone Encounter (Addendum)
Have entered order to see hematologist in [place]. He is going to need a phlebotomy and should not wait that long.

## 2018-05-16 NOTE — Telephone Encounter (Signed)
Pt called back saying [facility] cannot see him for a month.  Please advise  Thanks  Con [employer]

## 2018-05-16 NOTE — Telephone Encounter (Signed)
-----   Message from Birdie Sons, MD sent at 05/16/2018  7:44 AM EST ----- Red blood cells and platelets are very high. Need to see hematologist ASAP. If they can't see him at [facility] within the next week, then need referral to hematologist in [place].

## 2018-05-17 NOTE — Telephone Encounter (Signed)
I called patient this morning to check to see if he  heard from [facility]. Patient states that [facility] called him back and scheduled him an appointment for today at 1pm. He says he will be seeing a hematologist and they will be doing blood work, and "taking some blood from him". Patient also has an appointment tomorrow at 2pm for a brain scan with [facility].

## 2018-05-17 NOTE — Telephone Encounter (Signed)
Patient was advised yesterday and agreed to [facility] Referral. I spoke with referral coordinator Parke Poisson and advised of her the urgency of this referral.

## 2018-05-17 NOTE — Telephone Encounter (Signed)
I called and cancelled [facility] referral.

## 2018-05-31 ENCOUNTER — Encounter: Payer: Self-pay | Admitting: Family Medicine

## 2018-05-31 ENCOUNTER — Ambulatory Visit (INDEPENDENT_AMBULATORY_CARE_PROVIDER_SITE_OTHER): Payer: Medicare Other | Admitting: Family Medicine

## 2018-05-31 VITALS — BP 115/52 | HR 89 | Temp 98.0°F | Wt 172.0 lb

## 2018-05-31 DIAGNOSIS — I10 Essential (primary) hypertension: Secondary | ICD-10-CM

## 2018-05-31 DIAGNOSIS — D45 Polycythemia vera: Secondary | ICD-10-CM

## 2018-05-31 NOTE — Patient Instructions (Signed)
.   Please review the attached list of medications and notify my office if there are any errors.   . Please bring all of your medications to every appointment so we can make sure that our medication list is the same as yours.   

## 2018-05-31 NOTE — Progress Notes (Signed)
No results for input(s): HCT in the last 168 hours.    Patient: Jerry Mcpherson Male    DOB: Dec 28, 1940   78 y.o.   MRN: [id number] Visit Date: 05/31/2018  Today's Provider: Lelon Huh, MD   Chief Complaint  Patient presents with  . Abnormal Lab   Subjective:     HPI   Pt would like to discuss recent lab work and medication from [facility] for newly diagnosed polycythemia vera Lab Results  Component Value Date   WBC 17.3 (H) 05/15/2018   HGB 18.9 (H) 05/15/2018   HCT 56.9 (H) 05/15/2018   MCV 74 (L) 05/15/2018   PLT 866 (HH) 05/15/2018   He has had multiple phlebotomies and started Hydroxyurea 500mg  today.  He states he might feel a little lightheaded but not dizzy and was advised he may need to adjust his blood pressure medications.  He is feeling better over all.    BP Readings from Last 3 Encounters:  05/31/18 (!) 115/52  05/15/18 112/60  01/23/18 137/71     Allergies  Allergen Reactions  . Codeine     Heart racing  . Cozaar [Losartan Potassium]     angioedema  . Cyclobenzaprine     Musculoskeletal therapy agents cause Excessive sedation  . Shellfish Allergy     Rash, itching, swelling  . Ultram  [Tramadol Hcl] Nausea And Vomiting  . Celebrex [Celecoxib] Itching, Swelling and Rash    Cannot take when eating shellfish     Current Outpatient Medications:  .  acetaminophen (TYLENOL) 500 MG tablet, Take 2 tablets (1,000 mg total) by mouth every 6 (six) hours as needed for mild pain or moderate pain., Disp: 100 tablet, Rfl: 0 .  amLODipine (NORVASC) 5 MG tablet, TAKE 1 TABLET BY MOUTH EVERY DAY, Disp: 90 tablet, Rfl: 4 .  Ascorbic Acid (VITAMIN C) 1000 MG tablet, Take 2 tablets by mouth daily., Disp: , Rfl:  .  aspirin 81 MG tablet, Take 2 tablets daily, Disp: , Rfl:  .  diclofenac sodium (VOLTAREN) 1 % GEL, Apply 4 g topically 4 (four) times daily., Disp: 100 g, Rfl: 5 .  docusate sodium (COLACE) 100 MG capsule, Take 1 capsule by mouth daily as  needed., Disp: , Rfl:  .  Multiple Vitamins-Minerals (MULTIVITAMIN ADULT PO), Take 1 tablet by mouth daily., Disp: , Rfl:  .  naproxen (NAPROSYN) 500 MG tablet, TAKE 1 TABLET(500 MG) BY MOUTH TWICE DAILY WITH A MEAL, Disp: 30 tablet, Rfl: 5 .  oxyCODONE-acetaminophen (PERCOCET) 7.5-325 MG tablet, Take 1 tablet by mouth every 6 (six) hours as needed for severe pain., Disp: 90 tablet, Rfl: 0 .  sildenafil (VIAGRA) 50 MG tablet, Take 0.5-1 tablets by mouth daily as needed., Disp: , Rfl:  .  UNABLE TO FIND, Med Name: tumeric 500mg  twice a day, Disp: , Rfl:  .  valACYclovir (VALTREX) 500 MG tablet, TAKE 1 TABLET BY MOUTH TWICE DAILY FOR 3 DAYS AS NEEDED, Disp: 6 tablet, Rfl: 3  Review of Systems  Constitutional: Negative.   Neurological: Negative for dizziness, light-headedness and headaches.    Social History   Tobacco Use  . Smoking status: Former Smoker    Packs/day: 3.00    Years: 15.00    Pack years: 45.00    Types: Cigarettes    Last attempt to quit: 04/12/1968    Years since quitting: 50.1  . Smokeless tobacco: Never Used  Substance Use Topics  . Alcohol use: No  Alcohol/week: 0.0 standard drinks      Objective:   BP (!) 115/52 (BP Location: Right Arm, Patient Position: Sitting, Cuff Size: Normal)   Pulse 89   Temp 98 F (36.7 C) (Oral)   Wt 172 lb (78 kg)   BMI 23.33 kg/m     Physical Exam   General Appearance:    Alert, cooperative, no distress  Eyes:    PERRL, conjunctiva/corneas clear, EOM's intact       Lungs:     Clear to auscultation bilaterally, respirations unlabored  Heart:    Regular rate and rhythm, occasional premature beat  Neurologic:   Awake, alert, oriented x 3. No apparent focal neurological           defect.         Assessment & Plan    1. Polycythemia vera ([place]) Follow up [facility] 06/05/2018 as scheduled.   2. Essential hypertension Lowered BP since started treatment for PCV. Will put 5mg  amlodipine on hold. Stop by for BP check with nurse in  1 week.      Lelon Huh, MD  [facility]

## 2018-06-07 ENCOUNTER — Ambulatory Visit (INDEPENDENT_AMBULATORY_CARE_PROVIDER_SITE_OTHER): Payer: Medicare Other | Admitting: Family Medicine

## 2018-06-07 VITALS — BP 115/62

## 2018-06-07 DIAGNOSIS — I10 Essential (primary) hypertension: Secondary | ICD-10-CM

## 2018-06-07 NOTE — Patient Instructions (Signed)
.   Please review the attached list of medications and notify my office if there are any errors.   . Please bring all of your medications to every appointment so we can make sure that our medication list is the same as yours.   

## 2018-06-07 NOTE — Progress Notes (Signed)
Pt came in today for a blood pressure check.  He stated he is off the Amlodipine.  Blood pressure today was 115/62.  Pt was advised to stay off Amlodipine and recheck in about three months.

## 2018-06-07 NOTE — Progress Notes (Signed)
Nurse visit only for BP check BP 115/62 (BP Location: Left Arm, Patient Position: Sitting, Cuff Size: Normal)  Remain off of amlodipine as per previous o.v.

## 2018-06-12 ENCOUNTER — Other Ambulatory Visit: Payer: Self-pay | Admitting: Family Medicine

## 2018-06-12 DIAGNOSIS — M170 Bilateral primary osteoarthritis of knee: Secondary | ICD-10-CM

## 2018-06-12 MED ORDER — OXYCODONE-ACETAMINOPHEN 7.5-325 MG PO TABS: 1.0000 | ORAL_TABLET | Freq: Four times a day (QID) | ORAL | 0 refills | Status: DC | PRN

## 2018-06-12 NOTE — Telephone Encounter (Signed)
pt needs refill on   Oxycodone 7.5-325  [facility] [place]  Thanks [employer]

## 2018-06-14 ENCOUNTER — Encounter: Payer: Self-pay | Admitting: *Deleted

## 2018-06-14 NOTE — Progress Notes (Signed)
He does not have to have BP checked here every week. Just check it at home once a week. Let me know if It gets over 160, otherwise follow up in May as scheduled.

## 2018-06-14 NOTE — Progress Notes (Signed)
Patient stopped by office for a BP check. Bp was 143/70. Patient stated he was advised to check his bp here at the office every Wednesday and daily at home. Wanted to reify this was accurate, there is not ant documentation in chart stating this?

## 2018-06-15 NOTE — Progress Notes (Signed)
Pt advised.   Thanks,   -Laura  

## 2018-06-26 ENCOUNTER — Other Ambulatory Visit: Payer: Self-pay

## 2018-06-26 ENCOUNTER — Other Ambulatory Visit: Payer: Self-pay | Admitting: Family Medicine

## 2018-06-26 ENCOUNTER — Ambulatory Visit (INDEPENDENT_AMBULATORY_CARE_PROVIDER_SITE_OTHER): Payer: Medicare Other | Admitting: Family Medicine

## 2018-06-26 VITALS — BP 128/60 | HR 80 | Temp 97.9°F | Resp 16 | Wt 170.0 lb

## 2018-06-26 DIAGNOSIS — I10 Essential (primary) hypertension: Secondary | ICD-10-CM | POA: Diagnosis not present

## 2018-06-26 MED ORDER — AMLODIPINE BESYLATE 2.5 MG PO TABS: 2.5000 mg | ORAL_TABLET | Freq: Every day | ORAL | 3 refills | Status: DC

## 2018-06-26 NOTE — Progress Notes (Signed)
Patient: Jerry Mcpherson Male    DOB: 03-Jul-1940   78 y.o.   MRN: [id number] Visit Date: 06/26/2018  Today's Provider: Lelon Huh, MD   Chief Complaint  Patient presents with  . Blood Pressure Check   Subjective:   HPI:  Hypertension, follow-up:  BP Readings from Last 3 Encounters:  06/26/18 128/60  06/14/18 (!) 143/70  06/07/18 115/62    He was last seen for hypertension 3 weeks ago.  BP at that visit was 115/62. Management since that visit includes putting Amlodipine 5mg  on hold. Low BP was thought to be secondary to recently staring hydroxyurea for polycythemia vera.   He states that home BP have been variable, sometimes SBP in the 170s since stopping amlodipine.     Weight trend: fluctuating a bit Wt Readings from Last 3 Encounters:  06/26/18 170 lb (77.1 kg)  05/31/18 172 lb (78 kg)  05/15/18 170 lb (77.1 kg)    Current diet: well balanced  ------------------------------------------------------------------------    Allergies  Allergen Reactions  . Codeine     Heart racing  . Cozaar [Losartan Potassium]     angioedema  . Cyclobenzaprine     Musculoskeletal therapy agents cause Excessive sedation  . Shellfish Allergy     Rash, itching, swelling  . Ultram  [Tramadol Hcl] Nausea And Vomiting  . Celebrex [Celecoxib] Itching, Swelling and Rash    Cannot take when eating shellfish     Current Outpatient Medications:  .  acetaminophen (TYLENOL) 500 MG tablet, Take 2 tablets (1,000 mg total) by mouth every 6 (six) hours as needed for mild pain or moderate pain., Disp: 100 tablet, Rfl: 0 .  Ascorbic Acid (VITAMIN C) 1000 MG tablet, Take 2 tablets by mouth daily., Disp: , Rfl:  .  aspirin 81 MG tablet, Take 2 tablets daily (Patient taking differently: 81 mg 2 (two) times daily. ), Disp: , Rfl:  .  diclofenac sodium (VOLTAREN) 1 % GEL, Apply 4 g topically 4 (four) times daily., Disp: 100 g, Rfl: 5 .  docusate sodium (COLACE) 100 MG capsule, Take  1 capsule by mouth daily as needed., Disp: , Rfl:  .  Multiple Vitamins-Minerals (MULTIVITAMIN ADULT PO), Take 1 tablet by mouth daily., Disp: , Rfl:  .  naproxen (NAPROSYN) 500 MG tablet, TAKE 1 TABLET(500 MG) BY MOUTH TWICE DAILY WITH A MEAL, Disp: 30 tablet, Rfl: 5 .  oxyCODONE-acetaminophen (PERCOCET) 7.5-325 MG tablet, Take 1 tablet by mouth every 6 (six) hours as needed for severe pain., Disp: 90 tablet, Rfl: 0 .  sildenafil (VIAGRA) 50 MG tablet, Take 0.5-1 tablets by mouth daily as needed., Disp: , Rfl:  .  UNABLE TO FIND, Med Name: tumeric 500mg  twice a day, Disp: , Rfl:  .  valACYclovir (VALTREX) 500 MG tablet, TAKE 1 TABLET BY MOUTH TWICE DAILY FOR 3 DAYS AS NEEDED, Disp: 6 tablet, Rfl: 3 .  hydroxyurea (HYDREA) 500 MG capsule, Take 1 capsule by mouth daily., Disp: , Rfl:   Review of Systems  Social History   Tobacco Use  . Smoking status: Former Smoker    Packs/day: 3.00    Years: 15.00    Pack years: 45.00    Types: Cigarettes    Last attempt to quit: 04/12/1968    Years since quitting: 50.2  . Smokeless tobacco: Never Used  Substance Use Topics  . Alcohol use: No    Alcohol/week: 0.0 standard drinks      Objective:   BP  128/60 (BP Location: Left Arm, Cuff Size: Normal)   Pulse 80   Temp 97.9 F (36.6 C) (Oral)   Resp 16   Wt 170 lb (77.1 kg)   SpO2 95% Comment: room air  BMI 23.06 kg/m  Vitals:   06/26/18 1442 06/26/18 1445  BP: 122/62 128/60  Pulse: 80   Resp: 16   Temp: 97.9 F (36.6 C)   TempSrc: Oral   SpO2: 95%   Weight: 170 lb (77.1 kg)      Physical Exam  General appearance: alert, well developed, well nourished, cooperative and in no distress Head: Normocephalic, without obvious abnormality, atraumatic Respiratory: Respirations even and unlabored, normal respiratory rate Extremities: No gross deformities Skin: Skin color, texture, turgor normal. No rashes seen  Psych: Appropriate mood and affect. Neurologic: Mental status: Alert, oriented  to person, place, and time, thought content appropriate.     Assessment & Plan    1. Essential hypertension Home BP elevated since stopping amlodipine, will restart at lower dose.  - amLODipine (NORVASC) 2.5 MG tablet; Take 1 tablet (2.5 mg total) by mouth daily.  Dispense: 90 tablet; Refill: 3  Recheck one month.     Lelon Huh, MD  [facility]

## 2018-06-26 NOTE — Patient Instructions (Signed)
.   Please review the attached list of medications and notify my office if there are any errors.   . Please bring all of your medications to every appointment so we can make sure that our medication list is the same as yours.   

## 2018-07-03 ENCOUNTER — Telehealth: Payer: Self-pay | Admitting: Family Medicine

## 2018-07-03 ENCOUNTER — Encounter: Payer: Self-pay | Admitting: Family Medicine

## 2018-07-03 ENCOUNTER — Ambulatory Visit
Admission: RE | Admit: 2018-07-03 | Discharge: 2018-07-03 | Disposition: A | Discharge status: Home / Self Care | Payer: Medicare Other | Source: Ambulatory Visit | Attending: Family Medicine | Admitting: Family Medicine

## 2018-07-03 ENCOUNTER — Other Ambulatory Visit: Payer: Self-pay

## 2018-07-03 ENCOUNTER — Ambulatory Visit
Admission: RE | Admit: 2018-07-03 | Discharge: 2018-07-03 | Disposition: A | Discharge status: Home / Self Care | Payer: Medicare Other | Attending: Family Medicine | Admitting: Family Medicine

## 2018-07-03 ENCOUNTER — Ambulatory Visit (INDEPENDENT_AMBULATORY_CARE_PROVIDER_SITE_OTHER): Payer: Medicare Other | Admitting: Family Medicine

## 2018-07-03 VITALS — BP 122/52 | HR 54 | Temp 98.1°F | Resp 16 | Wt 170.0 lb

## 2018-07-03 DIAGNOSIS — R05 Cough: Secondary | ICD-10-CM | POA: Diagnosis present

## 2018-07-03 DIAGNOSIS — R059 Cough, unspecified: Secondary | ICD-10-CM

## 2018-07-03 MED ORDER — BUDESONIDE-FORMOTEROL FUMARATE 160-4.5 MCG/ACT IN AERO: 2.0000 | INHALATION_SPRAY | Freq: Two times a day (BID) | RESPIRATORY_TRACT | 0 refills | Status: DC

## 2018-07-03 NOTE — Progress Notes (Signed)
Patient: Jerry Mcpherson Male    DOB: 18-Mar-1941   78 y.o.   MRN: [id number] Visit Date: 07/03/2018  Today's Provider: Lelon Huh, MD   Chief Complaint  Patient presents with  . Cough    x 1 month   Subjective:     Cough  This is a recurrent problem. Episode onset: more than 1 month ago  The problem has been unchanged. The cough is productive of sputum (white/ yellow/ green sputum). Associated symptoms include rhinorrhea. Pertinent negatives include no chest pain, chills, ear congestion, ear pain, fever, headaches, hemoptysis, myalgias, nasal congestion, postnasal drip, sore throat, shortness of breath, sweats, weight loss or wheezing. Treatments tried: 2 differnt antibiotics within the past month (Z pack and Amoxicillin) The treatment provided mild relief.  Took azithromycin in October, and amoxicillin at end of December. States chest pressure resolved after antibiotic, but cough never resolved. Now coughing up yellow mucous. At night and day. Has occasional nasal drainage.   Allergies  Allergen Reactions  . Codeine     Heart racing  . Cozaar [Losartan Potassium]     angioedema  . Cyclobenzaprine     Musculoskeletal therapy agents cause Excessive sedation  . Shellfish Allergy     Rash, itching, swelling  . Ultram  [Tramadol Hcl] Nausea And Vomiting  . Celebrex [Celecoxib] Itching, Swelling and Rash    Cannot take when eating shellfish     Current Outpatient Medications:  .  acetaminophen (TYLENOL) 500 MG tablet, Take 2 tablets (1,000 mg total) by mouth every 6 (six) hours as needed for mild pain or moderate pain., Disp: 100 tablet, Rfl: 0 .  amLODipine (NORVASC) 2.5 MG tablet, Take 1 tablet (2.5 mg total) by mouth daily., Disp: 90 tablet, Rfl: 3 .  Ascorbic Acid (VITAMIN C) 1000 MG tablet, Take 2 tablets by mouth daily., Disp: , Rfl:  .  aspirin 81 MG tablet, Take 2 tablets daily (Patient taking differently: 81 mg 2 (two) times daily. ), Disp: , Rfl:  .   diclofenac sodium (VOLTAREN) 1 % GEL, Apply 4 g topically 4 (four) times daily., Disp: 100 g, Rfl: 5 .  docusate sodium (COLACE) 100 MG capsule, Take 1 capsule by mouth daily as needed., Disp: , Rfl:  .  hydroxyurea (HYDREA) 500 MG capsule, Take 1 capsule by mouth daily., Disp: , Rfl:  .  Multiple Vitamins-Minerals (MULTIVITAMIN ADULT PO), Take 1 tablet by mouth daily., Disp: , Rfl:  .  naproxen (NAPROSYN) 500 MG tablet, TAKE 1 TABLET(500 MG) BY MOUTH TWICE DAILY WITH A MEAL, Disp: 30 tablet, Rfl: 5 .  oxyCODONE-acetaminophen (PERCOCET) 7.5-325 MG tablet, Take 1 tablet by mouth every 6 (six) hours as needed for severe pain., Disp: 90 tablet, Rfl: 0 .  sildenafil (VIAGRA) 50 MG tablet, Take 0.5-1 tablets by mouth daily as needed., Disp: , Rfl:  .  UNABLE TO FIND, Med Name: tumeric 500mg  twice a day, Disp: , Rfl:  .  valACYclovir (VALTREX) 500 MG tablet, TAKE 1 TABLET BY MOUTH TWICE DAILY FOR 3 DAYS AS NEEDED, Disp: 6 tablet, Rfl: 3  Review of Systems  Constitutional: Negative for chills, fever and weight loss.  HENT: Positive for rhinorrhea. Negative for ear pain, postnasal drip and sore throat.   Respiratory: Positive for cough. Negative for hemoptysis, shortness of breath and wheezing.   Cardiovascular: Negative for chest pain.  Musculoskeletal: Negative for myalgias.  Neurological: Negative for headaches.    Social History   Tobacco Use  .  Smoking status: Former Smoker    Packs/day: 3.00    Years: 15.00    Pack years: 45.00    Types: Cigarettes    Last attempt to quit: 04/12/1968    Years since quitting: 50.2  . Smokeless tobacco: Never Used  Substance Use Topics  . Alcohol use: No    Alcohol/week: 0.0 standard drinks      Objective:   BP (!) 122/52 (BP Location: Left Arm, Patient Position: Sitting, Cuff Size: Normal)   Pulse (!) 54   Temp 98.1 F (36.7 C) (Oral)   Resp 16   Wt 170 lb (77.1 kg)   SpO2 96% Comment: room air  BMI 23.06 kg/m  Vitals:   07/03/18 1425  BP:  (!) 122/52  Pulse: (!) 54  Resp: 16  Temp: 98.1 F (36.7 C)  TempSrc: Oral  SpO2: 96%  Weight: 170 lb (77.1 kg)     Physical Exam  General Appearance:    Alert, cooperative, no distress  HENT:   bilateral TM normal without fluid or infection, neck without nodes and nasal mucosa congested  Eyes:    PERRL, conjunctiva/corneas clear, EOM's intact       Lungs:     Clear to auscultation bilaterally, respirations unlabored  Heart:    Regular rate and rhythm  Neurologic:   Awake, alert, oriented x 3. No apparent focal neurological           defect.       CXR. No active CP disease.     Assessment & Plan    1. Cough Persistent, likely some mild underlying reactive airway disease, does no appear to be infectious at this point.  - DG Chest 2 View; Future - budesonide-formoterol (SYMBICORT) 160-4.5 MCG/ACT inhaler; Inhale 2 puffs into the lungs 2 (two) times daily.  Dispense: 1 Inhaler; Refill: 0     Lelon Huh, MD  [facility]

## 2018-07-03 NOTE — Patient Instructions (Signed)
Go to the [facility] on [facility] for chest [place] .  Marland Kitchen Please review the attached list of medications and notify my office if there are any errors.

## 2018-07-03 NOTE — Telephone Encounter (Signed)
error 

## 2018-07-04 ENCOUNTER — Telehealth: Payer: Self-pay

## 2018-07-04 NOTE — Telephone Encounter (Signed)
-----   Message from Birdie Sons, MD sent at 07/04/2018  8:43 AM EDT ----- Chest xray is normal. Try to Symbocort samples we gave him for two weeks, and let me know if better or not when samples are out.

## 2018-07-04 NOTE — Telephone Encounter (Signed)
Advised patient of results.  

## 2018-07-04 NOTE — Telephone Encounter (Signed)
LMTCB 07/04/2018  Thanks,   -[name]

## 2018-07-10 ENCOUNTER — Other Ambulatory Visit: Payer: Self-pay

## 2018-07-10 DIAGNOSIS — M170 Bilateral primary osteoarthritis of knee: Secondary | ICD-10-CM

## 2018-07-10 MED ORDER — OXYCODONE-ACETAMINOPHEN 7.5-325 MG PO TABS: 1.0000 | ORAL_TABLET | Freq: Four times a day (QID) | ORAL | 0 refills | Status: DC | PRN

## 2018-07-10 NOTE — Telephone Encounter (Signed)
Patient called requesting refill. He also request that Dr. Caryn Section increase the dose if possible due to worsening pain in his legs. He states the Oxycodone is doing a good job, but he has to take it 4 times a day. Please advise.

## 2018-07-19 ENCOUNTER — Telehealth: Payer: Self-pay

## 2018-07-19 NOTE — Telephone Encounter (Signed)
Patient states he was supposed to come by and have his BP checked.  He is going to [place] on Tuesday and said he will call with the BP reading from that visit and let you know so you can tell him if he still needs to come in.

## 2018-07-28 ENCOUNTER — Ambulatory Visit: Payer: Self-pay | Admitting: Family Medicine

## 2018-07-28 ENCOUNTER — Telehealth: Payer: Self-pay

## 2018-07-28 NOTE — Telephone Encounter (Signed)
Pt is asking for nurse to call him.  He is losing weight and he has been going to [facility] because of his blood.  [facility] has asked for pt to see his PCP.  Call Back:  [phone]

## 2018-07-28 NOTE — Telephone Encounter (Signed)
Pt states he has lost about 20 pounds since January 2020.  Pt states his appetite is fine and is still eating about the same amount of calories everyday.  He has also added Ensure to his diet as well.   Pt is being treated for Erythrocytosis, thrombocytosis, and Leukocytosis at [facility].  He discuss his weight loss with the Drs. At [facility], they stated his treatments and diagnosis should not be the cause of his weight loss and to contact his PCP.   Should he make an office visit or can he make a telephone visit.  Pt states he has access to a computer/smartphone but does not know how to use them.   Thanks,   -[name]

## 2018-08-01 NOTE — Telephone Encounter (Signed)
Please review. Should patient come in for an appt or evisit?

## 2018-08-01 NOTE — Telephone Encounter (Signed)
Patient is requesting a call back today. KW

## 2018-08-01 NOTE — Telephone Encounter (Signed)
  Wt Readings from Last 6 Encounters:  07/03/18 170 lb (77.1 kg)  06/26/18 170 lb (77.1 kg)  05/31/18 172 lb (78 kg)  05/15/18 170 lb (77.1 kg)  01/23/18 173 lb (78.5 kg)  12/21/17 172 lb (78 kg)   His weight has been pretty steady on our scales. He has appointment scheduled at end of may, but he can move it up if he likes and we can recheck weight on our scales. If our scales agree with his home scales then we'll need to check some blood work.

## 2018-08-01 NOTE — Telephone Encounter (Signed)
Advised patient as below. Patient reports that his weight per his scales has gone from 189lbs to 168lbs since last year. He is concerned that he is losing weight so quickly. Patient wanted to come in and check his weight, and discuss possible treatment. Appt was scheduled for tomorrow (08/02/2018) at 10am.

## 2018-08-02 ENCOUNTER — Encounter: Payer: Self-pay | Admitting: Family Medicine

## 2018-08-02 ENCOUNTER — Other Ambulatory Visit: Payer: Self-pay

## 2018-08-02 ENCOUNTER — Ambulatory Visit (INDEPENDENT_AMBULATORY_CARE_PROVIDER_SITE_OTHER): Payer: Medicare Other | Admitting: Family Medicine

## 2018-08-02 VITALS — BP 133/75 | HR 86 | Temp 97.9°F | Resp 16 | Ht 70.0 in | Wt 165.0 lb

## 2018-08-02 DIAGNOSIS — R634 Abnormal weight loss: Secondary | ICD-10-CM

## 2018-08-02 NOTE — Patient Instructions (Signed)
.   Please review the attached list of medications and notify my office if there are any errors.   . Please bring all of your medications to every appointment so we can make sure that our medication list is the same as yours.   

## 2018-08-02 NOTE — Progress Notes (Signed)
Patient: Jerry Mcpherson Male    DOB: 06/03/1940   78 y.o.   MRN: [id number] Visit Date: 08/02/2018  Today's Provider: Lelon Huh, MD   Chief Complaint  Patient presents with  . Weight Loss   Subjective:     HPI Weight check: Patient comes in today for an evaluation of abnormal weight loss. Patient says he has lost 20lbs within the last 3 months. Was started on hydroxyurea at [facility] hematology for PCV a few months ago. States his appetite is very good. No abdominal pain, no bowel changes. A little bit fatigued. Sleeping ok. Not anxious. No fevers or chills.   Wt Readings from Last 10 Encounters:  08/02/18 165 lb (74.8 kg)  07/03/18 170 lb (77.1 kg)  06/26/18 170 lb (77.1 kg)  05/31/18 172 lb (78 kg)  05/15/18 170 lb (77.1 kg)  01/23/18 173 lb (78.5 kg)  12/21/17 172 lb (78 kg)  08/26/17 175 lb 3.2 oz (79.5 kg)  08/26/17 175 lb 3.2 oz (79.5 kg)  04/28/17 182 lb (82.6 kg)     Allergies  Allergen Reactions  . Codeine     Heart racing  . Cozaar [Losartan Potassium]     angioedema  . Cyclobenzaprine     Musculoskeletal therapy agents cause Excessive sedation  . Shellfish Allergy     Rash, itching, swelling  . Ultram  [Tramadol Hcl] Nausea And Vomiting  . Celebrex [Celecoxib] Itching, Swelling and Rash    Cannot take when eating shellfish     Current Outpatient Medications:  .  acetaminophen (TYLENOL) 500 MG tablet, Take 2 tablets (1,000 mg total) by mouth every 6 (six) hours as needed for mild pain or moderate pain., Disp: 100 tablet, Rfl: 0 .  amLODipine (NORVASC) 2.5 MG tablet, Take 1 tablet (2.5 mg total) by mouth daily., Disp: 90 tablet, Rfl: 3 .  Ascorbic Acid (VITAMIN C) 1000 MG tablet, Take 2 tablets by mouth daily., Disp: , Rfl:  .  aspirin 81 MG tablet, Take 2 tablets daily (Patient taking differently: 81 mg 2 (two) times daily. ), Disp: , Rfl:  .  budesonide-formoterol (SYMBICORT) 160-4.5 MCG/ACT inhaler, Inhale 2 puffs into the lungs 2 (two)  times daily., Disp: 1 Inhaler, Rfl: 0 .  diclofenac sodium (VOLTAREN) 1 % GEL, Apply 4 g topically 4 (four) times daily., Disp: 100 g, Rfl: 5 .  docusate sodium (COLACE) 100 MG capsule, Take 1 capsule by mouth daily as needed., Disp: , Rfl:  .  hydroxyurea (HYDREA) 500 MG capsule, Take 500 mg by mouth. 2 tablets 4 times a week. May take with food to minimize GI side effects., Disp: , Rfl:  .  Multiple Vitamins-Minerals (MULTIVITAMIN ADULT PO), Take 1 tablet by mouth daily., Disp: , Rfl:  .  naproxen (NAPROSYN) 500 MG tablet, TAKE 1 TABLET(500 MG) BY MOUTH TWICE DAILY WITH A MEAL, Disp: 30 tablet, Rfl: 5 .  oxyCODONE-acetaminophen (PERCOCET) 7.5-325 MG tablet, Take 1 tablet by mouth every 6 (six) hours as needed for severe pain., Disp: 90 tablet, Rfl: 0 .  sildenafil (VIAGRA) 50 MG tablet, Take 0.5-1 tablets by mouth daily as needed., Disp: , Rfl:  .  UNABLE TO FIND, Med Name: tumeric 500mg  twice a day, Disp: , Rfl:  .  valACYclovir (VALTREX) 500 MG tablet, TAKE 1 TABLET BY MOUTH TWICE DAILY FOR 3 DAYS AS NEEDED, Disp: 6 tablet, Rfl: 3  Review of Systems  Constitutional: Positive for fatigue and unexpected weight change. Negative for  appetite change, chills and fever.  Respiratory: Negative for chest tightness, shortness of breath and wheezing.   Cardiovascular: Negative for chest pain and palpitations.  Gastrointestinal: Negative for abdominal pain, nausea and vomiting.    Social History   Tobacco Use  . Smoking status: Former Smoker    Packs/day: 3.00    Years: 15.00    Pack years: 45.00    Types: Cigarettes    Last attempt to quit: 04/12/1968    Years since quitting: 50.3  . Smokeless tobacco: Never Used  Substance Use Topics  . Alcohol use: No    Alcohol/week: 0.0 standard drinks      Objective:   BP 133/75 (BP Location: Right Arm, Patient Position: Sitting, Cuff Size: Normal)   Pulse 86   Temp 97.9 F (36.6 C) (Oral)   Resp 16   Ht 5\' 10"  (1.778 m)   Wt 165 lb (74.8 kg)    SpO2 96% Comment: room air  BMI 23.68 kg/m  Vitals:   08/02/18 1007  BP: 133/75  Pulse: 86  Resp: 16  Temp: 97.9 F (36.6 C)  TempSrc: Oral  SpO2: 96%  Weight: 165 lb (74.8 kg)  Height: 5\' 10"  (1.778 m)     Physical Exam  General Appearance:    Alert, cooperative, no distress  Eyes:    PERRL, conjunctiva/corneas clear, EOM's intact       Lungs:     Clear to auscultation bilaterally, respirations unlabored  Heart:    Regular rate and rhythm  Abdomen:   bowel sounds present and normal in all 4 quadrants, soft, round, nontender or nondistended. No CVA tenderness       Assessment & Plan    1. Unexplained weight loss Has had extensive labs done at [facility] hematology due to recent dx of PCV, but has not had thyroid checked lately. He is up to date on routine screenings and no other s/s of pathological cause of weight loss. May be related to hydroxyurea he was recently started on.  - TSH - T4, free  Advised he is not at unhealthy weight, but he doesn't like being so thin. Consider trial of low dose periactin if thyroid functions normal.     Lelon Huh, MD  [facility]

## 2018-08-03 ENCOUNTER — Other Ambulatory Visit: Payer: Self-pay | Admitting: Family Medicine

## 2018-08-03 LAB — T4, FREE: Free T4: 1.14 ng/dL (ref 0.82–1.77)

## 2018-08-03 LAB — TSH: TSH: 4.57 u[IU]/mL — ABNORMAL HIGH (ref 0.450–4.500)

## 2018-08-03 MED ORDER — CYPROHEPTADINE HCL 4 MG PO TABS: 4.0000 mg | ORAL_TABLET | Freq: Three times a day (TID) | ORAL | 1 refills | Status: DC | PRN

## 2018-08-03 NOTE — Progress Notes (Signed)
Periactin to help with loss of weight.

## 2018-08-08 ENCOUNTER — Other Ambulatory Visit: Payer: Self-pay | Admitting: Family Medicine

## 2018-08-08 DIAGNOSIS — M170 Bilateral primary osteoarthritis of knee: Secondary | ICD-10-CM

## 2018-08-08 MED ORDER — OXYCODONE-ACETAMINOPHEN 7.5-325 MG PO TABS: 1.0000 | ORAL_TABLET | Freq: Four times a day (QID) | ORAL | 0 refills | Status: DC | PRN

## 2018-08-08 NOTE — Telephone Encounter (Signed)
Pt needs a refill on his  Oxycodone 7.5-325  [facility]  [place]  [employer]

## 2018-09-05 ENCOUNTER — Other Ambulatory Visit: Payer: Self-pay

## 2018-09-05 DIAGNOSIS — M170 Bilateral primary osteoarthritis of knee: Secondary | ICD-10-CM

## 2018-09-05 MED ORDER — OXYCODONE-ACETAMINOPHEN 7.5-325 MG PO TABS: 1.0000 | ORAL_TABLET | Freq: Four times a day (QID) | ORAL | 0 refills | Status: DC | PRN

## 2018-09-08 ENCOUNTER — Ambulatory Visit (INDEPENDENT_AMBULATORY_CARE_PROVIDER_SITE_OTHER): Payer: Medicare Other | Admitting: Family Medicine

## 2018-09-08 ENCOUNTER — Encounter: Payer: Self-pay | Admitting: Family Medicine

## 2018-09-08 ENCOUNTER — Other Ambulatory Visit: Payer: Self-pay

## 2018-09-08 VITALS — BP 152/63 | HR 72 | Temp 97.9°F | Wt 168.0 lb

## 2018-09-08 DIAGNOSIS — R634 Abnormal weight loss: Secondary | ICD-10-CM

## 2018-09-08 DIAGNOSIS — D45 Polycythemia vera: Secondary | ICD-10-CM

## 2018-09-08 DIAGNOSIS — I10 Essential (primary) hypertension: Secondary | ICD-10-CM | POA: Diagnosis not present

## 2018-09-08 NOTE — Patient Instructions (Signed)
.   Please review the attached list of medications and notify my office if there are any errors.   . Please bring all of your medications to every appointment so we can make sure that our medication list is the same as yours.   

## 2018-09-08 NOTE — Progress Notes (Signed)
Patient: Jerry Mcpherson Male    DOB: October 05, 1940   78 y.o.   MRN: [id number] Visit Date: 09/08/2018  Today's Provider: Lelon Huh, MD   No chief complaint on file.  Subjective:     HPI    Hypertension, follow-up:  BP Readings from Last 3 Encounters:  08/02/18 133/75  07/03/18 (!) 122/52  06/26/18 128/60    He was last seen for hypertension 2 months ago.  BP at that visit was 128/60. Management since that visit includes Restarted Amlodipine 2.5mg  a day. He reports excellent compliance with treatment, but missed dose today.  He is not having side effects.  He is exercising. He is adherent to low salt diet.   Outside blood pressures are not being checked. He is experiencing none.  Patient denies chest pain, chest pressure/discomfort, fatigue, lower extremity edema, palpitations, syncope and tachypnea.   Cardiovascular risk factors include advanced age (older than 85 for men, 51 for women), hypertension and male gender.  Use of agents associated with hypertension: none.     Weight trend: stable Wt Readings from Last 3 Encounters:  08/02/18 165 lb (74.8 kg)  07/03/18 170 lb (77.1 kg)  06/26/18 170 lb (77.1 kg)   He had been concerned about losing weight since staring hydroxyurea for polycythemia vera and was prescribed periactin, but he never started it due to cost.  Current diet: in general, a "healthy" diet    ------------------------------------------------------------------------   Allergies  Allergen Reactions  . Codeine     Heart racing  . Cozaar [Losartan Potassium]     angioedema  . Cyclobenzaprine     Musculoskeletal therapy agents cause Excessive sedation  . Shellfish Allergy     Rash, itching, swelling  . Ultram  [Tramadol Hcl] Nausea And Vomiting  . Celebrex [Celecoxib] Itching, Swelling and Rash    Cannot take when eating shellfish     Current Outpatient Medications:  .  acetaminophen (TYLENOL) 500 MG tablet, Take 2 tablets  (1,000 mg total) by mouth every 6 (six) hours as needed for mild pain or moderate pain., Disp: 100 tablet, Rfl: 0 .  amLODipine (NORVASC) 2.5 MG tablet, Take 1 tablet (2.5 mg total) by mouth daily., Disp: 90 tablet, Rfl: 3 .  Ascorbic Acid (VITAMIN C) 1000 MG tablet, Take 2 tablets by mouth daily., Disp: , Rfl:  .  aspirin 81 MG tablet, Take 2 tablets daily (Patient taking differently: 81 mg 2 (two) times daily. ), Disp: , Rfl:  .  budesonide-formoterol (SYMBICORT) 160-4.5 MCG/ACT inhaler, Inhale 2 puffs into the lungs 2 (two) times daily., Disp: 1 Inhaler, Rfl: 0 .  diclofenac sodium (VOLTAREN) 1 % GEL, Apply 4 g topically 4 (four) times daily., Disp: 100 g, Rfl: 5 .  docusate sodium (COLACE) 100 MG capsule, Take 1 capsule by mouth daily as needed., Disp: , Rfl:  .  hydroxyurea (HYDREA) 500 MG capsule, Take 500 mg by mouth. 2 tablets 4 times a week. May take with food to minimize GI side effects., Disp: , Rfl:  .  Multiple Vitamins-Minerals (MULTIVITAMIN ADULT PO), Take 1 tablet by mouth daily., Disp: , Rfl:  .  naproxen (NAPROSYN) 500 MG tablet, TAKE 1 TABLET(500 MG) BY MOUTH TWICE DAILY WITH A MEAL, Disp: 30 tablet, Rfl: 5 .  oxyCODONE-acetaminophen (PERCOCET) 7.5-325 MG tablet, Take 1 tablet by mouth every 6 (six) hours as needed for severe pain., Disp: 90 tablet, Rfl: 0 .  sildenafil (VIAGRA) 50 MG tablet, Take 0.5-1 tablets  by mouth daily as needed., Disp: , Rfl:  .  UNABLE TO FIND, Med Name: tumeric 500mg  twice a day, Disp: , Rfl:  .  valACYclovir (VALTREX) 500 MG tablet, TAKE 1 TABLET BY MOUTH TWICE DAILY FOR 3 DAYS AS NEEDED, Disp: 6 tablet, Rfl: 3  Review of Systems  Constitutional: Negative.   Respiratory: Positive for cough. Negative for apnea, choking, chest tightness, shortness of breath, wheezing and stridor.   Cardiovascular: Negative.   Gastrointestinal: Positive for nausea. Negative for abdominal distention, abdominal pain, anal bleeding, blood in stool, constipation, diarrhea,  rectal pain and vomiting.  Neurological: Negative for dizziness, light-headedness and headaches.    Social History   Tobacco Use  . Smoking status: Former Smoker    Packs/day: 3.00    Years: 15.00    Pack years: 45.00    Types: Cigarettes    Last attempt to quit: 04/12/1968    Years since quitting: 50.4  . Smokeless tobacco: Never Used  Substance Use Topics  . Alcohol use: No    Alcohol/week: 0.0 standard drinks      Objective:   BP (!) 152/63 (BP Location: Right Arm, Patient Position: Sitting, Cuff Size: Normal)   Pulse 72   Temp 97.9 F (36.6 C) (Oral)   Wt 168 lb (76.2 kg)   BMI 24.11 kg/m     Physical Exam   General Appearance:    Alert, cooperative, no distress  Eyes:    PERRL, conjunctiva/corneas clear, EOM's intact       Lungs:     Clear to auscultation bilaterally, respirations unlabored  Heart:    Regular rate and rhythm  Neurologic:   Awake, alert, oriented x 3. No apparent focal neurological           defect.           Assessment & Plan    1. Essential hypertension Doing well with addition of amlodipine, although up today due to missing medication. Continue current medications.    2. Polycythemia vera (HCC) Continue hydroxyurea as per [facility] heme/onc  3. Unexplained weight loss Has started coming back up, is probably side effect of hydroxyurea. Counseled that he is not underweight and not starting periactin for the time being.     The entirety of the information documented in the History of Present Illness, Review of Systems and Physical Exam were personally obtained by me. Portions of this information were initially documented by Ashley Royalty, CMA and reviewed by me for thoroughness and accuracy.   Lelon Huh, MD  [facility]

## 2018-09-29 ENCOUNTER — Other Ambulatory Visit: Payer: Self-pay | Admitting: Family Medicine

## 2018-09-29 DIAGNOSIS — M170 Bilateral primary osteoarthritis of knee: Secondary | ICD-10-CM

## 2018-09-29 NOTE — Telephone Encounter (Signed)
Pt needs a refill on his  Oxycodone 75-325  [facility] [place]  [facility] [employer]

## 2018-10-01 MED ORDER — OXYCODONE-ACETAMINOPHEN 7.5-325 MG PO TABS: 1.0000 | ORAL_TABLET | Freq: Four times a day (QID) | ORAL | 0 refills | Status: DC | PRN

## 2018-10-27 ENCOUNTER — Other Ambulatory Visit: Payer: Self-pay | Admitting: Family Medicine

## 2018-10-27 DIAGNOSIS — M170 Bilateral primary osteoarthritis of knee: Secondary | ICD-10-CM

## 2018-10-27 NOTE — Telephone Encounter (Signed)
Pt needs a refill   Oxycodone   [facility]  [place]  [place]

## 2018-10-28 MED ORDER — OXYCODONE-ACETAMINOPHEN 7.5-325 MG PO TABS: 1.0000 | ORAL_TABLET | Freq: Four times a day (QID) | ORAL | 0 refills | Status: DC | PRN

## 2018-11-20 ENCOUNTER — Telehealth: Payer: Self-pay

## 2018-11-20 ENCOUNTER — Other Ambulatory Visit: Payer: Self-pay

## 2018-11-20 DIAGNOSIS — M170 Bilateral primary osteoarthritis of knee: Secondary | ICD-10-CM

## 2018-11-20 MED ORDER — OXYCODONE-ACETAMINOPHEN 7.5-325 MG PO TABS: 1.0000 | ORAL_TABLET | Freq: Four times a day (QID) | ORAL | 0 refills | Status: DC | PRN

## 2018-11-20 NOTE — Telephone Encounter (Signed)
Encounter error

## 2018-12-15 ENCOUNTER — Other Ambulatory Visit: Payer: Self-pay | Admitting: *Deleted

## 2018-12-15 DIAGNOSIS — M170 Bilateral primary osteoarthritis of knee: Secondary | ICD-10-CM

## 2018-12-15 MED ORDER — OXYCODONE-ACETAMINOPHEN 7.5-325 MG PO TABS: 1.0000 | ORAL_TABLET | Freq: Four times a day (QID) | ORAL | 0 refills | Status: DC | PRN

## 2018-12-15 NOTE — Telephone Encounter (Signed)
Patient is requesting a refill for oxyCODONE-acetaminophen (PERCOCET) 7.5-325 MG tablet. Patient states he is going on vacation and is requesting this be sent in this weekend. Please advise?

## 2018-12-15 NOTE — Telephone Encounter (Signed)
Patient called back to see if his rx has been sent in yet. Please advise?

## 2018-12-15 NOTE — Telephone Encounter (Signed)
Please review. Thanks!  

## 2019-01-05 ENCOUNTER — Telehealth: Payer: Self-pay

## 2019-01-05 DIAGNOSIS — M170 Bilateral primary osteoarthritis of knee: Secondary | ICD-10-CM

## 2019-01-05 MED ORDER — OXYCODONE-ACETAMINOPHEN 7.5-325 MG PO TABS: 1.0000 | ORAL_TABLET | Freq: Four times a day (QID) | ORAL | 0 refills | Status: DC | PRN

## 2019-01-05 NOTE — Telephone Encounter (Signed)
Patient called requesting a refill on his oxycodone 7.5-325 MG due to going out of town for a few weeks. L.O.V. was on 09/08/2018 and upcoming appointment is 02/02/2019, please advise.

## 2019-01-24 ENCOUNTER — Telehealth: Payer: Self-pay | Admitting: Family Medicine

## 2019-01-24 NOTE — Telephone Encounter (Signed)
Pt needs a refill on  Oxycodone 7.5-325  [facility] [place]  [place]

## 2019-01-24 NOTE — Telephone Encounter (Signed)
Please review. KW 

## 2019-01-26 ENCOUNTER — Other Ambulatory Visit: Payer: Self-pay | Admitting: Family Medicine

## 2019-01-26 ENCOUNTER — Other Ambulatory Visit: Payer: Self-pay

## 2019-01-26 DIAGNOSIS — M170 Bilateral primary osteoarthritis of knee: Secondary | ICD-10-CM

## 2019-01-26 MED ORDER — OXYCODONE-ACETAMINOPHEN 7.5-325 MG PO TABS: 1.0000 | ORAL_TABLET | Freq: Four times a day (QID) | ORAL | 0 refills | Status: DC | PRN

## 2019-02-02 ENCOUNTER — Ambulatory Visit (INDEPENDENT_AMBULATORY_CARE_PROVIDER_SITE_OTHER): Payer: Medicare Other | Admitting: Family Medicine

## 2019-02-02 ENCOUNTER — Other Ambulatory Visit: Payer: Self-pay

## 2019-02-02 ENCOUNTER — Encounter: Payer: Self-pay | Admitting: Family Medicine

## 2019-02-02 VITALS — BP 136/68 | HR 84 | Temp 97.5°F | Wt 171.0 lb

## 2019-02-02 DIAGNOSIS — Z23 Encounter for immunization: Secondary | ICD-10-CM | POA: Diagnosis not present

## 2019-02-02 DIAGNOSIS — M170 Bilateral primary osteoarthritis of knee: Secondary | ICD-10-CM

## 2019-02-02 DIAGNOSIS — I10 Essential (primary) hypertension: Secondary | ICD-10-CM | POA: Diagnosis not present

## 2019-02-02 MED ORDER — OXYCODONE-ACETAMINOPHEN 10-325 MG PO TABS: 1.0000 | ORAL_TABLET | Freq: Four times a day (QID) | ORAL | 0 refills | Status: DC | PRN

## 2019-02-02 MED ORDER — NAPROXEN 500 MG PO TABS: ORAL_TABLET | ORAL | 5 refills | Status: DC

## 2019-02-02 NOTE — Patient Instructions (Signed)
.   Please review the attached list of medications and notify my office if there are any errors.   . Please bring all of your medications to every appointment so we can make sure that our medication list is the same as yours.   . It is especially important to get the annual flu vaccine this year. If you haven't had it already, please go to your pharmacy or call the office as soon as possible to schedule you flu shot.  

## 2019-02-02 NOTE — Progress Notes (Signed)
Patient: Jerry Mcpherson Male    DOB: Sep 29, 1940   78 y.o.   MRN: [id number] Visit Date: 02/02/2019  Today's Provider: Lelon Huh, MD   Chief Complaint  Patient presents with  . Hypertension   Subjective:     HPI  Hypertension, follow-up: BP Readings from Last 3 Encounters:  02/02/19 136/68  09/08/18 (!) 152/63  08/02/18 133/75     He was last seen for hypertension 5 months ago.  BP at that visit was 152/63 . Management since that visit includes continue Amlodipine 2.5mg  daily. He reports excellent compliance with treatment. He is not having side effects.  He is exercising. He is not adherent to low salt diet.   Outside blood pressures are not being checked at home, just at [facility] appointments. He is experiencing none.  Patient denies chest pain, chest pressure/discomfort, fatigue, lower extremity edema, palpitations, syncope and tachypnea.   Cardiovascular risk factors include advanced age (older than 40 for men, 53 for women), hypertension and male gender.  Use of agents associated with hypertension: none.               Weight trend: stable    Wt Readings from Last 3 Encounters:  08/02/18 165 lb (74.8 kg)  07/03/18 170 lb (77.1 kg)  06/26/18 170 lb (77.1 kg)   Current diet: in general, a "healthy" diet    ------------------------------------------------------------------------   Allergies  Allergen Reactions  . Codeine     Heart racing  . Cozaar [Losartan Potassium]     angioedema  . Cyclobenzaprine     Musculoskeletal therapy agents cause Excessive sedation  . Shellfish Allergy     Rash, itching, swelling  . Ultram  [Tramadol Hcl] Nausea And Vomiting  . Celebrex [Celecoxib] Itching, Swelling and Rash    Cannot take when eating shellfish     Current Outpatient Medications:  .  acetaminophen (TYLENOL) 500 MG tablet, Take 2 tablets (1,000 mg total) by mouth every 6 (six) hours as needed for mild pain or moderate pain., Disp: 100  tablet, Rfl: 0 .  amLODipine (NORVASC) 2.5 MG tablet, Take 1 tablet (2.5 mg total) by mouth daily., Disp: 90 tablet, Rfl: 3 .  Ascorbic Acid (VITAMIN C) 1000 MG tablet, Take 2 tablets by mouth daily., Disp: , Rfl:  .  aspirin 81 MG tablet, Take 2 tablets daily (Patient taking differently: 81 mg 2 (two) times daily. ), Disp: , Rfl:  .  budesonide-formoterol (SYMBICORT) 160-4.5 MCG/ACT inhaler, Inhale 2 puffs into the lungs 2 (two) times daily., Disp: 1 Inhaler, Rfl: 0 .  diclofenac sodium (VOLTAREN) 1 % GEL, Apply 4 g topically 4 (four) times daily., Disp: 100 g, Rfl: 5 .  docusate sodium (COLACE) 100 MG capsule, Take 1 capsule by mouth daily as needed., Disp: , Rfl:  .  hydroxyurea (HYDREA) 500 MG capsule, Take 500 mg by mouth. 2 tablets 3 days a week, and 1 tablet all other days, Disp: , Rfl:  .  Multiple Vitamins-Minerals (MULTIVITAMIN ADULT PO), Take 1 tablet by mouth daily., Disp: , Rfl:  .  naproxen (NAPROSYN) 500 MG tablet, TAKE 1 TABLET(500 MG) BY MOUTH TWICE DAILY WITH A MEAL, Disp: 30 tablet, Rfl: 5 .  oxyCODONE-acetaminophen (PERCOCET) 7.5-325 MG tablet, Take 1 tablet by mouth every 6 (six) hours as needed for severe pain., Disp: 90 tablet, Rfl: 0 .  sildenafil (VIAGRA) 50 MG tablet, Take 0.5-1 tablets by mouth daily as needed., Disp: , Rfl:  .  UNABLE TO FIND, Med Name: tumeric 500mg  twice a day, Disp: , Rfl:  .  valACYclovir (VALTREX) 500 MG tablet, TAKE 1 TABLET BY MOUTH TWICE DAILY FOR 3 DAYS AS NEEDED, Disp: 6 tablet, Rfl: 3  Review of Systems  Constitutional: Negative.   Respiratory: Negative.   Cardiovascular: Negative.   Musculoskeletal: Negative.     Social History   Tobacco Use  . Smoking status: Former Smoker    Packs/day: 3.00    Years: 15.00    Pack years: 45.00    Types: Cigarettes    Quit date: 04/12/1968    Years since quitting: 50.8  . Smokeless tobacco: Never Used  Substance Use Topics  . Alcohol use: No    Alcohol/week: 0.0 standard drinks       Objective:   BP 136/68 (BP Location: Right Arm, Patient Position: Sitting, Cuff Size: Normal)   Pulse 84   Temp (!) 97.5 F (36.4 C) (Temporal)   Wt 171 lb (77.6 kg)   SpO2 97%   BMI 24.54 kg/m  Vitals:   02/02/19 1104  BP: 136/68  Pulse: 84  Temp: (!) 97.5 F (36.4 C)  TempSrc: Temporal  SpO2: 97%  Weight: 171 lb (77.6 kg)  Body mass index is 24.54 kg/m.   Physical Exam   General Appearance:    Well developed, well nourished male in no acute distress  Eyes:    PERRL, conjunctiva/corneas clear, EOM's intact       Lungs:     Clear to auscultation bilaterally, respirations unlabored  Heart:    Normal heart rate. Normal rhythm. No murmurs, rubs, or gallops.   MS:   All extremities are intact.   Neurologic:   Awake, alert, oriented x 3. No apparent focal neurological           defect.           Assessment & Plan    1. Essential hypertension Well controlled.  Continue current medications.    2. Primary osteoarthritis of both knees He has been having more pain lately which he attributes to chemotherapy for polycythemia.  - oxyCODONE-acetaminophen (PERCOCET) 10-325 MG tablet; Take 1 tablet by mouth every 6 (six) hours as needed for pain.  Dispense: 120 tablet; Refill: 0 - naproxen (NAPROSYN) 500 MG tablet; TAKE 1 TABLET(500 MG) BY MOUTH TWICE DAILY WITH A MEAL  Dispense: 30 tablet; Refill: 5  3. Need for influenza vaccination  - Flu Vaccine QUAD High Dose(Fluad)       Lelon Huh, MD  [facility]

## 2019-02-28 ENCOUNTER — Other Ambulatory Visit: Payer: Self-pay | Admitting: Family Medicine

## 2019-02-28 DIAGNOSIS — M170 Bilateral primary osteoarthritis of knee: Secondary | ICD-10-CM

## 2019-02-28 MED ORDER — OXYCODONE-ACETAMINOPHEN 10-325 MG PO TABS: 1.0000 | ORAL_TABLET | Freq: Four times a day (QID) | ORAL | 0 refills | Status: DC | PRN

## 2019-02-28 NOTE — Telephone Encounter (Signed)
Requested medication (s) are due for refill today: yes  Requested medication (s) are on the active medication list: yes  Last refill:  02/02/2019  Future visit scheduled: yes  Notes to clinic:  Refill cannot be delegated  Review for refil   Requested Prescriptions  Pending Prescriptions Disp Refills   oxyCODONE-acetaminophen (PERCOCET) 10-325 MG tablet 120 tablet 0    Sig: Take 1 tablet by mouth every 6 (six) hours as needed for pain.     Not Delegated - Analgesics:  Opioid Agonist Combinations Failed - 02/28/2019  8:19 AM      Failed - This refill cannot be delegated      Failed - Urine Drug Screen completed in last 360 days.      Passed - Valid encounter within last 6 months    Recent Outpatient Visits          3 weeks ago Essential hypertension   [facility] Birdie Sons, MD   5 months ago Essential hypertension   [facility] Birdie Sons, MD   7 months ago Unexplained weight loss   [facility] Birdie Sons, MD   8 months ago Cough   [facility] Birdie Sons, MD   8 months ago Essential hypertension   [facility] Birdie Sons, MD      Future Appointments            In 3 months Fisher, [name], MD [facility] [place]

## 2019-02-28 NOTE — Telephone Encounter (Signed)
Medication refill: oxyCODONE-acetaminophen (PERCOCET) 10-325 MG tablet [id number]    Pt is going out of town Friday and will be gone a week so he would like this filled by Friday. Please advise    Pharmacy:  [place] [id number] [facility], [place] [address] AT [place] [phone] (Phone) [phone] (Fax)   Pt aware of turn around time.

## 2019-03-23 ENCOUNTER — Telehealth: Payer: Self-pay | Admitting: Family Medicine

## 2019-03-23 DIAGNOSIS — M170 Bilateral primary osteoarthritis of knee: Secondary | ICD-10-CM

## 2019-03-23 NOTE — Telephone Encounter (Signed)
Medication Refill - Medication: oxyCODONE-acetaminophen (PERCOCET) 10-325 MG tablet  Has the patient contacted their pharmacy? no (Agent: If no, request that the patient contact the pharmacy for the refill.) (Agent: If yes, when and what did the pharmacy advise?)  Preferred Pharmacy (with phone number or street name):  [facility] [id number] [facility], [place] [address] AT [place] Phone:  [phone]  Fax:  [phone]     Agent: Please be advised that RX refills may take up to 3 business days. We ask that you follow-up with your pharmacy.

## 2019-03-24 MED ORDER — OXYCODONE-ACETAMINOPHEN 10-325 MG PO TABS: 1.0000 | ORAL_TABLET | Freq: Four times a day (QID) | ORAL | 0 refills | Status: DC | PRN

## 2019-03-26 ENCOUNTER — Telehealth: Payer: Self-pay | Admitting: Family Medicine

## 2019-03-26 NOTE — Telephone Encounter (Signed)
OK to dispense today

## 2019-03-26 NOTE — Telephone Encounter (Signed)
Patient called stating the pharmacy will not give him Oxycodone - Aceta 10-325 mg. Because he is 4 days early getting it even though the RX says 03/24/19.   Patient wants [place] to call [facility] to see if they will release his med because "he is going out of town". [facility] [place]

## 2019-03-26 NOTE — Telephone Encounter (Signed)
From [facility] 

## 2019-03-26 NOTE — Telephone Encounter (Signed)
Patient called in stating he spoke with pharmacy and was told they still cannot fill it without approval until 16th. Pt states he is going out of town tomorrow and would really like his medication. Please advise.

## 2019-03-26 NOTE — Telephone Encounter (Signed)
Please advise if ok to call pharmacy to give verbal "ok to fill early."

## 2019-03-26 NOTE — Telephone Encounter (Signed)
Pharmacist wants to know if Dr. Caryn Section will give them an "ok to fill early" for this medication. Per pharmacist, patient is not due for refill until 03/28/2019. Patient need this medication before he leaves to go out of town tomorrow. Please advise.

## 2019-03-26 NOTE — Telephone Encounter (Signed)
[place] pharmacist from [facility] inquiring if PCP would fill oxyCODONE-acetaminophen (PERCOCET) 10-325 MG tablet early, please advise   [place], [place] [address] AT [place]

## 2019-03-26 NOTE — Telephone Encounter (Signed)
Patient states pharmacy has not received anything yet.  Patient states that pharmacy needs PCP to call the pharmacy to tell them he can refill early.   Patient call back [phone]

## 2019-03-26 NOTE — Telephone Encounter (Signed)
Pharmacy notified.

## 2019-03-26 NOTE — Telephone Encounter (Signed)
Pt was advised that PCP needs to call pharmacy / they will not fill Pts Rx for oxyCODONE-acetaminophen (PERCOCET) 10-325 MG tablet  Until they receive a call from the PCP / Pt has to go out of town early am for work for the next few weeks and needs to pick up medication today / please advise

## 2019-04-24 ENCOUNTER — Other Ambulatory Visit: Payer: Self-pay | Admitting: Family Medicine

## 2019-04-24 DIAGNOSIS — M170 Bilateral primary osteoarthritis of knee: Secondary | ICD-10-CM

## 2019-04-24 MED ORDER — OXYCODONE-ACETAMINOPHEN 10-325 MG PO TABS: 1.0000 | ORAL_TABLET | Freq: Four times a day (QID) | ORAL | 0 refills | Status: DC | PRN

## 2019-04-24 NOTE — Telephone Encounter (Signed)
Copied from [place] [id number]. Topic: Quick Communication - Rx Refill/Question >> Apr 24, 2019 11:34 AM Percell Belt A wrote: Medication: oxyCODONE-acetaminophen (PERCOCET) 10-325 MG tablet [id number]  Pt stated this is due the 18th but his going out town and will be gone for a week.  He would like to know if he can get this filled by tomorrow ?    Has the patient contacted their pharmacy? No. (Agent: If no, request that the patient contact the pharmacy for the refill.) (Agent: If yes, when and what did the pharmacy advise?)  Preferred Pharmacy (with phone number or street name): [facility] in [place]   Agent: Please be advised that RX refills may take up to 3 business days. We ask that you follow-up with your pharmacy.

## 2019-05-11 ENCOUNTER — Other Ambulatory Visit: Payer: Self-pay | Admitting: Family Medicine

## 2019-05-11 MED ORDER — VALACYCLOVIR HCL 500 MG PO TABS: ORAL_TABLET | ORAL | 3 refills | Status: DC

## 2019-05-11 NOTE — Telephone Encounter (Signed)
Patient is requesting a refill on the following medication  valACYclovir (VALTREX) 500 MG tablet   He would like this sent to [facility] in [place].

## 2019-05-21 ENCOUNTER — Other Ambulatory Visit: Payer: Self-pay

## 2019-05-21 ENCOUNTER — Ambulatory Visit (INDEPENDENT_AMBULATORY_CARE_PROVIDER_SITE_OTHER): Payer: Medicare PPO | Admitting: Family Medicine

## 2019-05-21 ENCOUNTER — Encounter: Payer: Self-pay | Admitting: Family Medicine

## 2019-05-21 VITALS — BP 138/62 | HR 93 | Temp 97.3°F | Resp 16 | Wt 175.0 lb

## 2019-05-21 DIAGNOSIS — L03032 Cellulitis of left toe: Secondary | ICD-10-CM

## 2019-05-21 MED ORDER — AMOXICILLIN-POT CLAVULANATE 875-125 MG PO TABS: 1.0000 | ORAL_TABLET | Freq: Two times a day (BID) | ORAL | 0 refills | Status: AC

## 2019-05-21 NOTE — Progress Notes (Signed)
Patient: Jerry Mcpherson Male    DOB: 11-14-1940   79 y.o.   MRN: [id number] Visit Date: 05/21/2019  Today's Provider: Lelon Huh, MD   Chief Complaint  Patient presents with  . Toe Pain    x 2-3 weeks   Subjective:     Toe Pain  Incident onset: 2-3 weeks ago. Injury mechanism: pulled a hang nail on the left big toe. Pain location: left great toe. The pain has been worsening since onset. Pertinent negatives include no numbness or tingling. Exacerbated by: walking or touching. He has tried nothing for the symptoms.     Allergies  Allergen Reactions  . Codeine     Heart racing  . Cozaar [Losartan Potassium]     angioedema  . Cyclobenzaprine     Musculoskeletal therapy agents cause Excessive sedation  . Shellfish Allergy     Rash, itching, swelling  . Ultram  [Tramadol Hcl] Nausea And Vomiting  . Celebrex [Celecoxib] Itching, Swelling and Rash    Cannot take when eating shellfish     Current Outpatient Medications:  .  acetaminophen (TYLENOL) 500 MG tablet, Take 2 tablets (1,000 mg total) by mouth every 6 (six) hours as needed for mild pain or moderate pain., Disp: 100 tablet, Rfl: 0 .  amLODipine (NORVASC) 2.5 MG tablet, Take 1 tablet (2.5 mg total) by mouth daily., Disp: 90 tablet, Rfl: 3 .  Ascorbic Acid (VITAMIN C) 1000 MG tablet, Take 2 tablets by mouth daily., Disp: , Rfl:  .  aspirin 81 MG tablet, Take 2 tablets daily (Patient taking differently: 81 mg 2 (two) times daily. ), Disp: , Rfl:  .  budesonide-formoterol (SYMBICORT) 160-4.5 MCG/ACT inhaler, Inhale 2 puffs into the lungs 2 (two) times daily., Disp: 1 Inhaler, Rfl: 0 .  diclofenac sodium (VOLTAREN) 1 % GEL, Apply 4 g topically 4 (four) times daily., Disp: 100 g, Rfl: 5 .  docusate sodium (COLACE) 100 MG capsule, Take 1 capsule by mouth daily as needed., Disp: , Rfl:  .  hydroxyurea (HYDREA) 500 MG capsule, Take 500 mg by mouth. 2 tablets 3 days a week, and 1 tablet all other days, Disp: , Rfl:  .   Multiple Vitamins-Minerals (MULTIVITAMIN ADULT PO), Take 1 tablet by mouth daily., Disp: , Rfl:  .  naproxen (NAPROSYN) 500 MG tablet, TAKE 1 TABLET(500 MG) BY MOUTH TWICE DAILY WITH A MEAL, Disp: 30 tablet, Rfl: 5 .  oxyCODONE-acetaminophen (PERCOCET) 10-325 MG tablet, Take 1 tablet by mouth every 6 (six) hours as needed for pain., Disp: 120 tablet, Rfl: 0 .  sildenafil (VIAGRA) 50 MG tablet, Take 0.5-1 tablets by mouth daily as needed., Disp: , Rfl:  .  UNABLE TO FIND, Med Name: tumeric 500mg  twice a day, Disp: , Rfl:  .  valACYclovir (VALTREX) 500 MG tablet, TAKE 1 TABLET BY MOUTH TWICE DAILY FOR 3 DAYS AS NEEDED, Disp: 6 tablet, Rfl: 3  Review of Systems  Constitutional: Negative for appetite change, chills and fever.  Respiratory: Negative for chest tightness, shortness of breath and wheezing.   Cardiovascular: Negative for chest pain and palpitations.  Gastrointestinal: Negative for abdominal pain, nausea and vomiting.  Musculoskeletal: Positive for joint swelling (left great toe).  Skin: Positive for color change (left great toe).  Neurological: Negative for tingling and numbness.  Hematological: Bruises/bleeds easily.    Social History   Tobacco Use  . Smoking status: Former Smoker    Packs/day: 3.00    Years: 15.00  Pack years: 45.00    Types: Cigarettes    Quit date: 04/12/1968    Years since quitting: 51.1  . Smokeless tobacco: Never Used  Substance Use Topics  . Alcohol use: No    Alcohol/week: 0.0 standard drinks      Objective:   BP 138/62 (BP Location: Left Arm, Patient Position: Sitting, Cuff Size: Large)   Pulse 93   Temp (!) 97.3 F (36.3 C) (Temporal)   Resp 16   Wt 175 lb (79.4 kg)   SpO2 97% Comment: room air  BMI 25.11 kg/m  Vitals:   05/21/19 1505  BP: 138/62  Pulse: 93  Resp: 16  Temp: (!) 97.3 F (36.3 C)  TempSrc: Temporal  SpO2: 97%  Weight: 175 lb (79.4 kg)  Body mass index is 25.11 kg/m.   Physical Exam  Left medial great toe  slightly swollen and inflamed around nail no discharge No results found for any visits on 05/21/19.     Assessment & Plan    1. Paronychia of great toe, left  - amoxicillin-clavulanate (AUGMENTIN) 875-125 MG tablet; Take 1 tablet by mouth 2 (two) times daily before a meal for 10 days.  Dispense: 20 tablet; Refill: 0  Call if symptoms change or if not rapidly improving.    The entirety of the information documented in the History of Present Illness, Review of Systems and Physical Exam were personally obtained by me. Portions of this information were initially documented by Meyer Cory, CMA and reviewed by me for thoroughness and accuracy.      Lelon Huh, MD  [facility]

## 2019-05-22 ENCOUNTER — Telehealth: Payer: Self-pay | Admitting: Family Medicine

## 2019-05-22 DIAGNOSIS — M170 Bilateral primary osteoarthritis of knee: Secondary | ICD-10-CM

## 2019-05-22 MED ORDER — OXYCODONE-ACETAMINOPHEN 10-325 MG PO TABS: 1.0000 | ORAL_TABLET | Freq: Four times a day (QID) | ORAL | 0 refills | Status: DC | PRN

## 2019-05-22 NOTE — Telephone Encounter (Signed)
Medication Refill - Medication:  oxyCODONE-acetaminophen (PERCOCET) 10-325 MG tablet [id number] Pt is going out of town for work tomorrow and needs to see if he can get this today?    Has the patient contacted their pharmacy? No. (Agent: If no, request that the patient contact the pharmacy for the refill.) (Agent: If yes, when and what did the pharmacy advise?)  Preferred Pharmacy (with phone number or street name): [facility] in [place]: Please be advised that RX refills may take up to 3 business days. We ask that you follow-up with your pharmacy.

## 2019-06-06 ENCOUNTER — Encounter: Payer: Self-pay | Admitting: Family Medicine

## 2019-06-06 ENCOUNTER — Ambulatory Visit (INDEPENDENT_AMBULATORY_CARE_PROVIDER_SITE_OTHER): Payer: Medicare PPO | Admitting: Family Medicine

## 2019-06-06 ENCOUNTER — Other Ambulatory Visit: Payer: Self-pay

## 2019-06-06 VITALS — BP 166/62 | HR 76 | Temp 96.9°F | Wt 176.6 lb

## 2019-06-06 DIAGNOSIS — L03032 Cellulitis of left toe: Secondary | ICD-10-CM | POA: Diagnosis not present

## 2019-06-06 DIAGNOSIS — D45 Polycythemia vera: Secondary | ICD-10-CM | POA: Diagnosis not present

## 2019-06-06 DIAGNOSIS — I10 Essential (primary) hypertension: Secondary | ICD-10-CM | POA: Diagnosis not present

## 2019-06-06 MED ORDER — AMOXICILLIN-POT CLAVULANATE 875-125 MG PO TABS: 1.0000 | ORAL_TABLET | Freq: Two times a day (BID) | ORAL | 0 refills | Status: AC

## 2019-06-06 MED ORDER — HYDROCHLOROTHIAZIDE 25 MG PO TABS: 25.0000 mg | ORAL_TABLET | Freq: Every day | ORAL | 0 refills | Status: DC

## 2019-06-06 NOTE — Progress Notes (Signed)
Patient: Jerry Mcpherson Male    DOB: 08/02/1940   79 y.o.   MRN: [id number] Visit Date: 06/06/2019  Today's Provider: Lelon Huh, MD   Chief Complaint  Patient presents with  . Hypertension   Subjective:     HPI  Hypertension, follow-up:    BP Readings from Last 3 Encounters:  02/02/19 136/68  09/08/18 (!) 152/63  08/02/18 133/75     Hewas last seen for hypertension 4 monthsago.  BP at that visit was 136/68. Management since that visit includescontinue Amlodipine 2.5mg  daily. Hereports excellentcompliance with treatment. Heis nothaving side effects.  Heisnot exercising, just working. Heisnot adherent to low salt diet.  Outside blood pressures arenot being checked at home. Heis experiencing none.  Patient denieschest pain, chest pressure/discomfort, fatigue, lower extremity edema, palpitations, syncope and tachypnea.  Cardiovascular risk factors includeadvanced age (older than 21 for men, 7 for women), hypertension and male gender.  Use of agents associated with hypertension:none.   Weight trend:stable    Wt Readings from Last 3 Encounters:  08/02/18 165 lb (74.8 kg)  07/03/18 170 lb (77.1 kg)  06/26/18 170 lb (77.1 kg)   He was treated for paronychia of left medial great tow on 2/8 with 10 days of Augmentin. He reports that swelling has gone done and is no longer draining, but remains tender and mildly swollen.    Allergies  Allergen Reactions  . Codeine     Heart racing  . Cozaar [Losartan Potassium]     angioedema  . Cyclobenzaprine     Musculoskeletal therapy agents cause Excessive sedation  . Shellfish Allergy     Rash, itching, swelling  . Ultram  [Tramadol Hcl] Nausea And Vomiting  . Celebrex [Celecoxib] Itching, Swelling and Rash    Cannot take when eating shellfish     Current Outpatient Medications:  .  acetaminophen (TYLENOL) 500 MG tablet, Take 2 tablets (1,000 mg total) by mouth every 6  (six) hours as needed for mild pain or moderate pain., Disp: 100 tablet, Rfl: 0 .  amLODipine (NORVASC) 2.5 MG tablet, Take 1 tablet (2.5 mg total) by mouth daily., Disp: 90 tablet, Rfl: 3 .  Ascorbic Acid (VITAMIN C) 1000 MG tablet, Take 2 tablets by mouth daily., Disp: , Rfl:  .  aspirin 81 MG tablet, Take 2 tablets daily (Patient taking differently: 81 mg 2 (two) times daily. ), Disp: , Rfl:  .  budesonide-formoterol (SYMBICORT) 160-4.5 MCG/ACT inhaler, Inhale 2 puffs into the lungs 2 (two) times daily., Disp: 1 Inhaler, Rfl: 0 .  diclofenac sodium (VOLTAREN) 1 % GEL, Apply 4 g topically 4 (four) times daily., Disp: 100 g, Rfl: 5 .  docusate sodium (COLACE) 100 MG capsule, Take 1 capsule by mouth daily as needed., Disp: , Rfl:  .  hydroxyurea (HYDREA) 500 MG capsule, Take 500 mg by mouth. 2 tablets 3 days a week, and 1 tablet all other days, Disp: , Rfl:  .  Multiple Vitamins-Minerals (MULTIVITAMIN ADULT PO), Take 1 tablet by mouth daily., Disp: , Rfl:  .  naproxen (NAPROSYN) 500 MG tablet, TAKE 1 TABLET(500 MG) BY MOUTH TWICE DAILY WITH A MEAL, Disp: 30 tablet, Rfl: 5 .  oxyCODONE-acetaminophen (PERCOCET) 10-325 MG tablet, Take 1 tablet by mouth every 6 (six) hours as needed for pain., Disp: 120 tablet, Rfl: 0 .  sildenafil (VIAGRA) 50 MG tablet, Take 0.5-1 tablets by mouth daily as needed., Disp: , Rfl:  .  UNABLE TO FIND, Med Name: tumeric  500mg  twice a day, Disp: , Rfl:  .  valACYclovir (VALTREX) 500 MG tablet, TAKE 1 TABLET BY MOUTH TWICE DAILY FOR 3 DAYS AS NEEDED, Disp: 6 tablet, Rfl: 3  Review of Systems  Constitutional: Negative.   Respiratory: Negative.   Cardiovascular: Negative.   Musculoskeletal: Negative.     Social History   Tobacco Use  . Smoking status: Former Smoker    Packs/day: 3.00    Years: 15.00    Pack years: 45.00    Types: Cigarettes    Quit date: 04/12/1968    Years since quitting: 51.1  . Smokeless tobacco: Never Used  Substance Use Topics  . Alcohol  use: No    Alcohol/week: 0.0 standard drinks      Objective:   BP (!) 166/62 (BP Location: Right Arm, Patient Position: Sitting, Cuff Size: Normal)   Pulse 76   Temp (!) 96.9 F (36.1 C) (Temporal)   Wt 176 lb 9.6 oz (80.1 kg)   BMI 25.34 kg/m  Vitals:   06/06/19 0925  BP: (!) 166/62  Pulse: 76  Temp: (!) 96.9 F (36.1 C)  TempSrc: Temporal  Weight: 176 lb 9.6 oz (80.1 kg)  Body mass index is 25.34 kg/m.   Physical Exam  General appearance:  Well developed, well nourished male, cooperative and in no acute distress Head: Normocephalic, without obvious abnormality, atraumatic Respiratory: Respirations even and unlabored, normal respiratory rate Extremities: All extremities are intact. Left medial great toe slightly swollen and tender around nail no discharge. Less so than at exam of 05-21-2019 Skin: Skin color, texture, turgor normal. No rashes seen  Psych: Appropriate mood and affect. Neurologic: Mental status: Alert, oriented to person, place, and time, thought content appropriate.    No results found for any visits on 06/06/19.     Assessment & Plan    1. Paronychia of great toe, left Improved, but not resolved.  - amoxicillin-clavulanate (AUGMENTIN) 875-125 MG tablet; Take 1 tablet by mouth 2 (two) times daily for 10 days.  Dispense: 20 tablet; Refill: 0  2. Essential hypertension add- hydrochlorothiazide (HYDRODIURIL) 25 MG tablet; Take 1 tablet (25 mg total) by mouth daily.  Dispense: 90 tablet; Refill: 0  Follow up bp check in 2 months.   3. Polycythemia vera ([place]) He continue hydroxyurea prescribed at [facility] which he is doing well with. Counseled that this medication is not likely related to his BP or paronychia.      The entirety of the information documented in the History of Present Illness, Review of Systems and Physical Exam were personally obtained by me. Portions of this information were initially documented by Idelle Jo, CMA and reviewed by me  for thoroughness and accuracy.  Lelon Huh, MD  [facility]

## 2019-06-18 ENCOUNTER — Telehealth: Payer: Self-pay | Admitting: Family Medicine

## 2019-06-18 DIAGNOSIS — M170 Bilateral primary osteoarthritis of knee: Secondary | ICD-10-CM

## 2019-06-18 MED ORDER — OXYCODONE-ACETAMINOPHEN 10-325 MG PO TABS: 1.0000 | ORAL_TABLET | Freq: Four times a day (QID) | ORAL | 0 refills | Status: DC | PRN

## 2019-06-18 NOTE — Telephone Encounter (Signed)
RX REFILL oxyCODONE-acetaminophen (PERCOCET) 10-325 MG tablet  PHARMACY [facility] [id number] - [place], [place] [address] AT [place] Phone:  [phone]  Fax:  [phone]

## 2019-06-19 ENCOUNTER — Other Ambulatory Visit: Payer: Self-pay | Admitting: Family Medicine

## 2019-06-19 DIAGNOSIS — I10 Essential (primary) hypertension: Secondary | ICD-10-CM

## 2019-06-19 NOTE — Telephone Encounter (Signed)
Requested Prescriptions  Pending Prescriptions Disp Refills  . amLODipine (NORVASC) 2.5 MG tablet [Pharmacy Med Name: AMLODIPINE BESYLATE 2.5MG  TABLETS] 90 tablet 3    Sig: TAKE 1 TABLET(2.5 MG) BY MOUTH DAILY     Cardiovascular:  Calcium Channel Blockers Failed - 06/19/2019  3:31 AM      Failed - Last BP in normal range    BP Readings from Last 1 Encounters:  06/06/19 (!) 166/62         Passed - Valid encounter within last 6 months    Recent Outpatient Visits          1 week ago Paronychia of great toe, left   [facility] Birdie Sons, MD   4 weeks ago Paronychia of great toe, left   [facility] Birdie Sons, MD   4 months ago Essential hypertension   [facility] Birdie Sons, MD   9 months ago Essential hypertension   [facility] Birdie Sons, MD   10 months ago Unexplained weight loss   [facility] Birdie Sons, MD      Future Appointments            In 1 month Fisher, [name], MD [facility], [place]

## 2019-07-11 NOTE — Progress Notes (Signed)
Subjective:   Jerry Mcpherson is a 79 y.o. male who presents for Medicare Annual/Subsequent preventive examination.    This visit is being conducted through telemedicine due to the COVID-19 pandemic. This patient has given me verbal consent via doximity to conduct this visit, patient states they are participating from their home address. Some vital signs may be absent or patient reported.    Patient identification: identified by name, DOB, and current address  Review of Systems:  N/A  Cardiac Risk Factors include: advanced age (>64men, >41 women);male gender;hypertension     Objective:    Vitals: There were no vitals taken for this visit.  There is no height or weight on file to calculate BMI. Unable to obtain vitals due to visit being conducted via telephonically.   Advanced Directives 07/16/2019 08/26/2017 08/24/2016 08/24/2016  Does Patient Have a Medical Advance Directive? No No No No  Would patient like information on creating a medical advance directive? No - Patient declined No - Patient declined - -    Tobacco Social History   Tobacco Use  Smoking Status Former Smoker  . Packs/day: 3.00  . Years: 15.00  . Pack years: 45.00  . Types: Cigarettes  . Quit date: 04/12/1968  . Years since quitting: 51.2  Smokeless Tobacco Never Used     Counseling given: Not Answered   Clinical Intake:  Pre-visit preparation completed: Yes  Pain : No/denies pain Pain Score: 0-No pain     Nutritional Risks: None Diabetes: No  How often do you need to have someone help you when you read instructions, pamphlets, or other written materials from your doctor or pharmacy?: 1 - Never  Interpreter Needed?: No  Information entered by :: [facility], LPN  Past Medical History:  Diagnosis Date  . Genital herpes   . Hyperglycemia   . Hypertension   . Squamous cell carcinoma    Past Surgical History:  Procedure Laterality Date  . CATARACT EXTRACTION Left 10/2012   Dr.  Murvin Natal; [facility]  . EYE SURGERY Left    removed a piece of steel  . HERNIA REPAIR  [id number]   umbilical hernia, incarderated; Dr. Bary Castilla  . ROTATOR CUFF REPAIR    . SQUAMOUS CELL CARCINOMA EXCISION  2008  . TONSILLECTOMY     Family History  Problem Relation Age of Onset  . Hypertension Mother   . Heart attack Mother   . Stroke Father   . Cancer Father   . COPD Sister   . Congestive Heart Failure Brother    Social History   Socioeconomic History  . Marital status: Divorced    Spouse name: Not on file  . Number of children: 1  . Years of education: Not on file  . Highest education level: Some college, no degree  Occupational History  . Occupation: Retired  . Occupation: mechanic/technical part time  Tobacco Use  . Smoking status: Former Smoker    Packs/day: 3.00    Years: 15.00    Pack years: 45.00    Types: Cigarettes    Quit date: 04/12/1968    Years since quitting: 51.2  . Smokeless tobacco: Never Used  Substance and Sexual Activity  . Alcohol use: No    Alcohol/week: 0.0 standard drinks  . Drug use: No  . Sexual activity: Not on file  Other Topics Concern  . Not on file  Social History Narrative  . Not on file   Social Determinants of Health   Financial Resource Strain: Low  Risk   . Difficulty of Paying Living Expenses: Not very hard  Food Insecurity: No Food Insecurity  . Worried About Charity fundraiser in the Last Year: Never true  . Ran Out of Food in the Last Year: Never true  Transportation Needs: No Transportation Needs  . Lack of Transportation (Medical): No  . Lack of Transportation (Non-Medical): No  Physical Activity: Inactive  . Days of Exercise per Week: 0 days  . Minutes of Exercise per Session: 0 min  Stress: No Stress Concern Present  . Feeling of Stress : Not at all  Social Connections: Somewhat Isolated  . Frequency of Communication with Friends and Family: Once a week  . Frequency of Social Gatherings with Friends  and Family: More than three times a week  . Attends Religious Services: More than 4 times per year  . Active Member of Clubs or Organizations: No  . Attends Archivist Meetings: Never  . Marital Status: Divorced    Outpatient Encounter Medications as of 07/16/2019  Medication Sig  . acetaminophen (TYLENOL) 500 MG tablet Take 2 tablets (1,000 mg total) by mouth every 6 (six) hours as needed for mild pain or moderate pain.  Marland Kitchen amLODipine (NORVASC) 2.5 MG tablet TAKE 1 TABLET(2.5 MG) BY MOUTH DAILY  . Ascorbic Acid (VITAMIN C) 1000 MG tablet Take 2 tablets by mouth daily.  Marland Kitchen aspirin 81 MG tablet Take 2 tablets daily (Patient taking differently: 81 mg 2 (two) times daily. )  . diclofenac sodium (VOLTAREN) 1 % GEL Apply 4 g topically 4 (four) times daily.  Marland Kitchen docusate sodium (COLACE) 100 MG capsule Take 1 capsule by mouth daily as needed.  . hydroxyurea (HYDREA) 500 MG capsule Take 500 mg by mouth as directed. 2 tablets 3 days a week, and 1 tablet all other days  . Multiple Vitamins-Minerals (MULTIVITAMIN ADULT PO) Take 1 tablet by mouth daily.  . naproxen (NAPROSYN) 500 MG tablet TAKE 1 TABLET(500 MG) BY MOUTH TWICE DAILY WITH A MEAL  . oxyCODONE-acetaminophen (PERCOCET) 10-325 MG tablet Take 1 tablet by mouth every 6 (six) hours as needed for pain.  . sildenafil (VIAGRA) 50 MG tablet Take 0.5-1 tablets by mouth daily as needed.  Marland Kitchen UNABLE TO FIND Med Name: tumeric 500mg  twice a day  . valACYclovir (VALTREX) 500 MG tablet TAKE 1 TABLET BY MOUTH TWICE DAILY FOR 3 DAYS AS NEEDED  . budesonide-formoterol (SYMBICORT) 160-4.5 MCG/ACT inhaler Inhale 2 puffs into the lungs 2 (two) times daily. (Patient not taking: Reported on 07/16/2019)  . hydrochlorothiazide (HYDRODIURIL) 25 MG tablet Take 1 tablet (25 mg total) by mouth daily.   No facility-administered encounter medications on file as of 07/16/2019.    Activities of Daily Living In your present state of health, do you have any difficulty  performing the following activities: 07/16/2019 02/02/2019  Hearing? Y Y  Comment Does not wear hearing aids. -  Vision? Y Y  Comment Needs a new eye glass prescription. -  Difficulty concentrating or making decisions? N Y  Walking or climbing stairs? Y Y  Comment Due to knee pains. -  Dressing or bathing? N N  Doing errands, shopping? N N  Preparing Food and eating ? N -  Using the Toilet? N -  In the past six months, have you accidently leaked urine? N -  Do you have problems with loss of bowel control? N -  Managing your Medications? N -  Managing your Finances? N -  Housekeeping or managing  your Housekeeping? N -  Some recent data might be hidden    Patient Care Team: Birdie Sons, MD as PCP - General (Family Medicine) Clyde Canterbury, MD as Referring Physician (Otolaryngology) O'Branski, Cleophus Molt (Oncology)   Assessment:   This is a routine wellness examination for Jerry Mcpherson.  Exercise Activities and Dietary recommendations Current Exercise Habits: The patient does not participate in regular exercise at present, Exercise limited by: orthopedic condition(s)  Goals    . Have 3 meals a day     Recommend eating three healthy meals a day.        Fall Risk: Fall Risk  07/16/2019 02/02/2019 08/26/2017 08/24/2016 01/28/2016  Falls in the past year? 0 0 No No No  Number falls in past yr: 0 0 - - -  Injury with Fall? 0 0 - - -    FALL RISK PREVENTION PERTAINING TO THE HOME:  Any stairs in or around the home? No If so, are there any without handrails? N/A  Home free of loose throw rugs in walkways, pet beds, electrical cords, etc? Yes  Adequate lighting in your home to reduce risk of falls? Yes   ASSISTIVE DEVICES UTILIZED TO PREVENT FALLS:  Life alert? No  Use of a cane, walker or w/c? No  Grab bars in the bathroom? No  Shower chair or bench in shower? No  Elevated toilet seat or a handicapped toilet? Yes  TIMED UP AND GO:  Was the test performed? No .     Depression Screen PHQ 2/9 Scores 02/02/2019 08/26/2017 08/26/2017 08/24/2016  PHQ - 2 Score 0 0 0 0  PHQ- 9 Score - 0 - 0    Cognitive Function: Declined today.      6CIT Screen 08/26/2017 08/24/2016  What Year? 0 points 0 points  What month? 0 points 0 points  What time? 0 points 0 points  Count back from 20 0 points 0 points  Months in reverse 0 points 0 points  Repeat phrase 0 points 2 points  Total Score 0 2    Immunization History  Administered Date(s) Administered  . Fluad Quad(high Dose 65+) 02/02/2019  . Influenza Split 01/01/2009  . Influenza, High Dose Seasonal PF 12/31/2013, 12/12/2014, 01/28/2016, 02/03/2017, 12/14/2017  . Influenza-Unspecified 05/22/2017  . Moderna SARS-COVID-2 Vaccination 05/26/2019, 06/23/2019  . Pneumococcal Conjugate-13 12/31/2013  . Pneumococcal Polysaccharide-23 02/22/2004  . Tdap 10/28/2011  . Zoster 01/28/2009    Qualifies for Shingles Vaccine? Yes  Zostavax completed 01/28/09. Due for Shingrix. Pt has been advised to call insurance company to determine out of pocket expense. Advised may also receive vaccine at local pharmacy or Health Dept. Verbalized acceptance and understanding.  Tdap: Up to date  Flu Vaccine: Up to date  Pneumococcal Vaccine: Completed series  Screening Tests Health Maintenance  Topic Date Due  . INFLUENZA VACCINE  11/11/2019  . TETANUS/TDAP  10/27/2021  . PNA vac Low Risk Adult  Completed   Cancer Screenings:  Colorectal Screening: No longer required.   Lung Cancer Screening: (Low Dose CT Chest recommended if Age 56-80 years, 30 pack-year currently smoking OR have quit w/in 15years.) does not qualify.   Additional Screening:  Vision Screening: Recommended annual ophthalmology exams for early detection of glaucoma and other disorders of the eye.  Dental Screening: Recommended annual dental exams for proper oral hygiene  Community Resource Referral:  CRR required this visit?  No        Plan:  I  have personally reviewed and addressed the  Medicare Annual Wellness questionnaire and have noted the following in the patient's chart:  A. Medical and social history B. Use of alcohol, tobacco or illicit drugs  C. Current medications and supplements D. Functional ability and status E.  Nutritional status F.  Physical activity G. Advance directives H. List of other physicians I.  Hospitalizations, surgeries, and ER visits in previous 12 months J.  [place] such as hearing and vision if needed, cognitive and depression L. Referrals and appointments   In addition, I have reviewed and discussed with patient certain preventive protocols, quality metrics, and best practice recommendations. A written personalized care plan for preventive services as well as general preventive health recommendations were provided to patient.   [place]ora Score, [place]  [id number] Nurse Health Advisor   Nurse Notes: None.

## 2019-07-13 DIAGNOSIS — Z79899 Other long term (current) drug therapy: Secondary | ICD-10-CM | POA: Diagnosis not present

## 2019-07-13 DIAGNOSIS — I739 Peripheral vascular disease, unspecified: Secondary | ICD-10-CM | POA: Diagnosis not present

## 2019-07-13 DIAGNOSIS — I10 Essential (primary) hypertension: Secondary | ICD-10-CM | POA: Diagnosis not present

## 2019-07-13 DIAGNOSIS — M1990 Unspecified osteoarthritis, unspecified site: Secondary | ICD-10-CM | POA: Diagnosis not present

## 2019-07-13 DIAGNOSIS — Z7982 Long term (current) use of aspirin: Secondary | ICD-10-CM | POA: Diagnosis not present

## 2019-07-13 DIAGNOSIS — Z87891 Personal history of nicotine dependence: Secondary | ICD-10-CM | POA: Diagnosis not present

## 2019-07-13 DIAGNOSIS — C969 Malignant neoplasm of lymphoid, hematopoietic and related tissue, unspecified: Secondary | ICD-10-CM | POA: Diagnosis not present

## 2019-07-13 DIAGNOSIS — Z791 Long term (current) use of non-steroidal anti-inflammatories (NSAID): Secondary | ICD-10-CM | POA: Diagnosis not present

## 2019-07-13 DIAGNOSIS — G8929 Other chronic pain: Secondary | ICD-10-CM | POA: Diagnosis not present

## 2019-07-13 DIAGNOSIS — E663 Overweight: Secondary | ICD-10-CM | POA: Diagnosis not present

## 2019-07-16 ENCOUNTER — Other Ambulatory Visit: Payer: Self-pay

## 2019-07-16 ENCOUNTER — Other Ambulatory Visit: Payer: Self-pay | Admitting: Family Medicine

## 2019-07-16 ENCOUNTER — Ambulatory Visit (INDEPENDENT_AMBULATORY_CARE_PROVIDER_SITE_OTHER): Payer: Medicare PPO

## 2019-07-16 DIAGNOSIS — Z0000 Encounter for general adult medical examination without abnormal findings: Secondary | ICD-10-CM | POA: Diagnosis not present

## 2019-07-16 DIAGNOSIS — M170 Bilateral primary osteoarthritis of knee: Secondary | ICD-10-CM

## 2019-07-16 NOTE — Telephone Encounter (Signed)
Medication Refill - Medication: oxyCODONE-acetaminophen (PERCOCET) 10-325 MG tablet   Has the patient contacted their pharmacy? Yes.   (Agent: If no, request that the patient contact the pharmacy for the refill.) (Agent: If yes, when and what did the pharmacy advise?)  Preferred Pharmacy (with phone number or street name):  [facility] [place], [place] - [address]  [address] [address] [place] [address]  Phone: [phone] Fax: [phone]     Agent: Please be advised that RX refills may take up to 3 business days. We ask that you follow-up with your pharmacy.

## 2019-07-16 NOTE — Patient Instructions (Signed)
Jerry Mcpherson , Thank you for taking time to come for your Medicare Wellness Visit. I appreciate your ongoing commitment to your health goals. Please review the following plan we discussed and let me know if I can assist you in the future.   Screening recommendations/referrals: Colonoscopy: No longer required.  Recommended yearly ophthalmology/optometry visit for glaucoma screening and checkup Recommended yearly dental visit for hygiene and checkup  Vaccinations: Influenza vaccine: Up to date Pneumococcal vaccine: Completed series Tdap vaccine: Up to date Shingles vaccine: Pt declines today.     Advanced directives: Advance directive discussed with you today. Even though you declined this today please call our office should you change your mind and we can give you the proper paperwork for you to fill out.  Conditions/risks identified: Recommend to eat 3 small meals a day with 2 healthy snacks in between.   Next appointment: 08/15/19 @ 9:40 AM with Dr Caryn Section  Preventive Care 45 Years and Older, Male Preventive care refers to lifestyle choices and visits with your health care provider that can promote health and wellness. What does preventive care include?  A yearly physical exam. This is also called an annual well check.  Dental exams once or twice a year.  Routine eye exams. Ask your health care provider how often you should have your eyes checked.  Personal lifestyle choices, including:  Daily care of your teeth and gums.  Regular physical activity.  Eating a healthy diet.  Avoiding tobacco and drug use.  Limiting alcohol use.  Practicing safe sex.  Taking low doses of aspirin every day.  Taking vitamin and mineral supplements as recommended by your health care provider. What happens during an annual well check? The services and screenings done by your health care provider during your annual well check will depend on your age, overall health, lifestyle risk factors, and  family history of disease. Counseling  Your health care provider may ask you questions about your:  Alcohol use.  Tobacco use.  Drug use.  Emotional well-being.  Home and relationship well-being.  Sexual activity.  Eating habits.  History of falls.  Memory and ability to understand (cognition).  Work and work Statistician. Screening  You may have the following tests or measurements:  Height, weight, and BMI.  Blood pressure.  Lipid and cholesterol levels. These may be checked every 5 years, or more frequently if you are over 6 years old.  Skin check.  Lung cancer screening. You may have this screening every year starting at age 81 if you have a 30-pack-year history of smoking and currently smoke or have quit within the past 15 years.  Fecal occult blood test (FOBT) of the stool. You may have this test every year starting at age 50.  Flexible sigmoidoscopy or colonoscopy. You may have a sigmoidoscopy every 5 years or a colonoscopy every 10 years starting at age 45.  Prostate cancer screening. Recommendations will vary depending on your family history and other risks.  Hepatitis C blood test.  Hepatitis B blood test.  Sexually transmitted disease (STD) testing.  Diabetes screening. This is done by checking your blood sugar (glucose) after you have not eaten for a while (fasting). You may have this done every 1-3 years.  Abdominal aortic aneurysm (AAA) screening. You may need this if you are a current or former smoker.  Osteoporosis. You may be screened starting at age 68 if you are at high risk. Talk with your health care provider about your test results, treatment options,  and if necessary, the need for more tests. Vaccines  Your health care provider may recommend certain vaccines, such as:  Influenza vaccine. This is recommended every year.  Tetanus, diphtheria, and acellular pertussis (Tdap, Td) vaccine. You may need a Td booster every 10 years.  Zoster  vaccine. You may need this after age 46.  Pneumococcal 13-valent conjugate (PCV13) vaccine. One dose is recommended after age 79.  Pneumococcal polysaccharide (PPSV23) vaccine. One dose is recommended after age 4. Talk to your health care provider about which screenings and vaccines you need and how often you need them. This information is not intended to replace advice given to you by your health care provider. Make sure you discuss any questions you have with your health care provider. Document Released: 04/25/2015 Document Revised: 12/17/2015 Document Reviewed: 01/28/2015 Elsevier Interactive Patient Education  2017 [place] Prevention in the Home Falls can cause injuries. They can happen to people of all ages. There are many things you can do to make your home safe and to help prevent falls. What can I do on the outside of my home?  Regularly fix the edges of walkways and driveways and fix any cracks.  Remove anything that might make you trip as you walk through a door, such as a raised step or threshold.  Trim any bushes or trees on the path to your home.  Use bright outdoor lighting.  Clear any walking paths of anything that might make someone trip, such as rocks or tools.  Regularly check to see if handrails are loose or broken. Make sure that both sides of any steps have handrails.  Any raised decks and porches should have guardrails on the edges.  Have any leaves, snow, or ice cleared regularly.  Use sand or salt on walking paths during winter.  Clean up any spills in your garage right away. This includes oil or grease spills. What can I do in the bathroom?  Use night lights.  Install grab bars by the toilet and in the tub and shower. Do not use towel bars as grab bars.  Use non-skid mats or decals in the tub or shower.  If you need to sit down in the shower, use a plastic, non-slip stool.  Keep the floor dry. Clean up any water that spills on the  floor as soon as it happens.  Remove soap buildup in the tub or shower regularly.  Attach bath mats securely with double-sided non-slip rug tape.  Do not have throw rugs and other things on the floor that can make you trip. What can I do in the bedroom?  Use night lights.  Make sure that you have a light by your bed that is easy to reach.  Do not use any sheets or blankets that are too big for your bed. They should not hang down onto the floor.  Have a firm chair that has side arms. You can use this for support while you get dressed.  Do not have throw rugs and other things on the floor that can make you trip. What can I do in the kitchen?  Clean up any spills right away.  Avoid walking on wet floors.  Keep items that you use a lot in easy-to-reach places.  If you need to reach something above you, use a strong step stool that has a grab bar.  Keep electrical cords out of the way.  Do not use floor polish or wax that makes floors slippery. If  you must use wax, use non-skid floor wax.  Do not have throw rugs and other things on the floor that can make you trip. What can I do with my stairs?  Do not leave any items on the stairs.  Make sure that there are handrails on both sides of the stairs and use them. Fix handrails that are broken or loose. Make sure that handrails are as long as the stairways.  Check any carpeting to make sure that it is firmly attached to the stairs. Fix any carpet that is loose or worn.  Avoid having throw rugs at the top or bottom of the stairs. If you do have throw rugs, attach them to the floor with carpet tape.  Make sure that you have a light switch at the top of the stairs and the bottom of the stairs. If you do not have them, ask someone to add them for you. What else can I do to help prevent falls?  Wear shoes that:  Do not have high heels.  Have rubber bottoms.  Are comfortable and fit you well.  Are closed at the toe. Do not wear  sandals.  If you use a stepladder:  Make sure that it is fully opened. Do not climb a closed stepladder.  Make sure that both sides of the stepladder are locked into place.  Ask someone to hold it for you, if possible.  Clearly mark and make sure that you can see:  Any grab bars or handrails.  First and last steps.  Where the edge of each step is.  Use tools that help you move around (mobility aids) if they are needed. These include:  Canes.  Walkers.  Scooters.  Crutches.  Turn on the lights when you go into a dark area. Replace any light bulbs as soon as they burn out.  Set up your furniture so you have a clear path. Avoid moving your furniture around.  If any of your floors are uneven, fix them.  If there are any pets around you, be aware of where they are.  Review your medicines with your doctor. Some medicines can make you feel dizzy. This can increase your chance of falling. Ask your doctor what other things that you can do to help prevent falls. This information is not intended to replace advice given to you by your health care provider. Make sure you discuss any questions you have with your health care provider. Document Released: 01/23/2009 Document Revised: 09/04/2015 Document Reviewed: 05/03/2014 Elsevier Interactive Patient Education  2017 [employer].

## 2019-07-17 NOTE — Telephone Encounter (Signed)
30 day supply dispensed 06-20-19

## 2019-07-18 MED ORDER — OXYCODONE-ACETAMINOPHEN 10-325 MG PO TABS: 1.0000 | ORAL_TABLET | Freq: Four times a day (QID) | ORAL | 0 refills | Status: DC | PRN

## 2019-07-19 DIAGNOSIS — Z1589 Genetic susceptibility to other disease: Secondary | ICD-10-CM | POA: Diagnosis not present

## 2019-07-19 DIAGNOSIS — D45 Polycythemia vera: Secondary | ICD-10-CM | POA: Diagnosis not present

## 2019-07-19 LAB — HEPATIC FUNCTION PANEL
ALT: 12 (ref 10–40)
AST: 19 (ref 14–40)
Alkaline Phosphatase: 145 — AB (ref 25–125)
Bilirubin, Total: 0.7

## 2019-07-19 LAB — BASIC METABOLIC PANEL
BUN: 25 — AB (ref 4–21)
CO2: 28 — AB (ref 13–22)
Chloride: 103 (ref 99–108)
Creatinine: 1.3 (ref 0.6–1.3)
Glucose: 204
Potassium: 3.6 (ref 3.4–5.3)
Sodium: 139 (ref 137–147)

## 2019-07-19 LAB — COMPREHENSIVE METABOLIC PANEL
Albumin: 3.7 (ref 3.5–5.0)
Calcium: 8.7 (ref 8.7–10.7)
GFR calc non Af Amer: 52

## 2019-07-19 LAB — CBC AND DIFFERENTIAL
HCT: 43 (ref 41–53)
Hemoglobin: 12.7 — AB (ref 13.5–17.5)
Platelets: 305 (ref 150–399)
WBC: 13.5

## 2019-07-19 LAB — CBC: RBC: 5.03 (ref 3.87–5.11)

## 2019-07-23 ENCOUNTER — Other Ambulatory Visit: Payer: Self-pay

## 2019-07-23 ENCOUNTER — Ambulatory Visit (INDEPENDENT_AMBULATORY_CARE_PROVIDER_SITE_OTHER): Payer: Medicare PPO | Admitting: Family Medicine

## 2019-07-23 ENCOUNTER — Encounter: Payer: Self-pay | Admitting: Family Medicine

## 2019-07-23 VITALS — BP 146/74 | HR 90 | Temp 96.9°F | Wt 174.4 lb

## 2019-07-23 DIAGNOSIS — L03032 Cellulitis of left toe: Secondary | ICD-10-CM | POA: Diagnosis not present

## 2019-07-23 DIAGNOSIS — A6000 Herpesviral infection of urogenital system, unspecified: Secondary | ICD-10-CM | POA: Diagnosis not present

## 2019-07-23 DIAGNOSIS — D45 Polycythemia vera: Secondary | ICD-10-CM | POA: Diagnosis not present

## 2019-07-23 MED ORDER — VALACYCLOVIR HCL 1 G PO TABS: 1000.0000 mg | ORAL_TABLET | Freq: Two times a day (BID) | ORAL | 5 refills | Status: DC | PRN

## 2019-07-23 NOTE — Progress Notes (Signed)
Established patient visit      Patient: Jerry Mcpherson   DOB: March 28, 1941   79 y.o. Male  MRN: [id number] Visit Date: 07/23/2019  Today's healthcare provider: Lelon Huh, MD  Subjective:    Chief Complaint  Patient presents with  . Follow-up   HPI  Follow up for Paronychia of great toe:  The patient was last seen for this 06/06/2019 Changes made at last visit include started Augmentin.  He reports good compliance with treatment. He feels that condition is Improved. He is not having side effects.   ------------------------------------------------------------------------------------  His primary complaint today is has had flare of genital herpes and has taken 3 rounds of valacyclovir 500mg  before lesions finally cleared up. He is being treated for PCV and wondering if this may be making medication less effective.     Medications: Outpatient Medications Prior to Visit  Medication Sig  . acetaminophen (TYLENOL) 500 MG tablet Take 2 tablets (1,000 mg total) by mouth every 6 (six) hours as needed for mild pain or moderate pain.  Marland Kitchen amLODipine (NORVASC) 2.5 MG tablet TAKE 1 TABLET(2.5 MG) BY MOUTH DAILY  . Ascorbic Acid (VITAMIN C) 1000 MG tablet Take 2 tablets by mouth daily.  Marland Kitchen aspirin 81 MG tablet Take 2 tablets daily (Patient taking differently: 81 mg 2 (two) times daily. )  . diclofenac sodium (VOLTAREN) 1 % GEL Apply 4 g topically 4 (four) times daily.  Marland Kitchen docusate sodium (COLACE) 100 MG capsule Take 1 capsule by mouth daily as needed.  . hydrochlorothiazide (HYDRODIURIL) 25 MG tablet Take 1 tablet (25 mg total) by mouth daily.  . hydroxyurea (HYDREA) 500 MG capsule Take 500 mg by mouth as directed. 2 tablets 3 days a week, and 1 tablet all other days  . Multiple Vitamins-Minerals (MULTIVITAMIN ADULT PO) Take 1 tablet by mouth daily.  . naproxen (NAPROSYN) 500 MG tablet TAKE 1 TABLET(500 MG) BY MOUTH TWICE DAILY WITH A MEAL  . oxyCODONE-acetaminophen (PERCOCET)  10-325 MG tablet Take 1 tablet by mouth every 6 (six) hours as needed for pain.  . sildenafil (VIAGRA) 50 MG tablet Take 0.5-1 tablets by mouth daily as needed.  Marland Kitchen UNABLE TO FIND Med Name: tumeric 500mg  twice a day  . valACYclovir (VALTREX) 500 MG tablet TAKE 1 TABLET BY MOUTH TWICE DAILY FOR 3 DAYS AS NEEDED  . budesonide-formoterol (SYMBICORT) 160-4.5 MCG/ACT inhaler Inhale 2 puffs into the lungs 2 (two) times daily. (Patient not taking: Reported on 07/16/2019)   No facility-administered medications prior to visit.    Review of Systems  Constitutional: Negative.   Respiratory: Negative.   Cardiovascular: Negative.   Musculoskeletal: Negative.         Objective:    BP (!) 146/74 (BP Location: Right Arm, Patient Position: Sitting, Cuff Size: Normal)   Pulse 90   Temp (!) 96.9 F (36.1 C) (Temporal)   Wt 174 lb 6.4 oz (79.1 kg)   BMI 25.02 kg/m    Physical Exam  Previously affect toe has completely resolved.   No results found for any visits on 07/23/19.    Assessment & Plan:    1. Genital herpes simplex, unspecified site Considering underlying PCV, will change from 500 to - valACYclovir (VALTREX) 1000 MG tablet; Take 1 tablet (1,000 mg total) by mouth 2 (two) times daily as needed.  Dispense: 15 tablet; Refill: 5  2. Paronychia of great toe, left Completely resolved  3. Polycythemia vera ([place]) Continue routine follow up [facility]  Caryn Section, MD  [facility] [phone] (phone) [phone] (fax)  [place]

## 2019-07-27 ENCOUNTER — Other Ambulatory Visit: Payer: Self-pay | Admitting: Family Medicine

## 2019-07-27 DIAGNOSIS — I10 Essential (primary) hypertension: Secondary | ICD-10-CM

## 2019-08-14 NOTE — Progress Notes (Signed)
Established patient visit   Patient: Jerry Mcpherson   DOB: [birth year]   79 y.o. Male  MRN: [id number] Visit Date: 08/15/2019  Today's healthcare provider: Lelon Huh, MD   Chief Complaint  Patient presents with  . Hypertension   Subjective    HPI  Hypertension, follow-up  BP Readings from Last 3 Encounters:  08/15/19 110/60  07/23/19 (!) 146/74  06/06/19 (!) 166/62   Wt Readings from Last 3 Encounters:  08/15/19 171 lb (77.6 kg)  07/23/19 174 lb 6.4 oz (79.1 kg)  06/06/19 176 lb 9.6 oz (80.1 kg)     He was last seen for hypertension 10 weeks ago.  BP at that visit was 166/62. Management since that visit includes adding HCTZ 25mg  daily.  He reports good compliance with treatment. He is not having side effects.  He is following a Regular diet. He is exercising. He does not smoke.  Use of agents associated with hypertension: NSAIDS.   Outside blood pressures are not checked. Symptoms: No chest pain No chest pressure No palpitations No dyspnea No orthopnea No paroxysmal nocturnal dyspnea Yes lower extremity edema No syncope   Pertinent labs: Lab Results  Component Value Date   CHOL 135 08/26/2017   HDL 30 (L) 08/26/2017   LDLCALC 68 08/26/2017   TRIG 183 (H) 08/26/2017   CHOLHDL 4.5 08/26/2017   Lab Results  Component Value Date   NA 139 07/19/2019   K 3.6 07/19/2019   CO2 28 (A) 07/19/2019   GLUCOSE 97 08/26/2017   BUN 25 (A) 07/19/2019   CREATININE 1.3 07/19/2019   CALCIUM 8.7 07/19/2019   GFRNONAA 52 07/19/2019   GFRAA 72 08/26/2017     The ASCVD Risk score (Goff DC Jr., et al., 2013) failed to calculate for the following reasons:   The 2013 ASCVD risk score is only valid for ages 76 to 50   He was also noted to have had a glucose of 204 when checked at [facility] last month.  ---------------------------------------------------------------------------------------------------  Abnormal TSH-patient was noted to have an abnormal TSH in  the past. Lab Results  Component Value Date   TSH 4.570 (H) 08/02/2018         Medications: Outpatient Medications Prior to Visit  Medication Sig  . acetaminophen (TYLENOL) 500 MG tablet Take 2 tablets (1,000 mg total) by mouth every 6 (six) hours as needed for mild pain or moderate pain.  Marland Kitchen amLODipine (NORVASC) 2.5 MG tablet TAKE 1 TABLET(2.5 MG) BY MOUTH DAILY  . Ascorbic Acid (VITAMIN C) 1000 MG tablet Take 2 tablets by mouth daily.  Marland Kitchen aspirin 81 MG tablet Take 2 tablets daily (Patient taking differently: 81 mg 2 (two) times daily. )  . budesonide-formoterol (SYMBICORT) 160-4.5 MCG/ACT inhaler Inhale 2 puffs into the lungs 2 (two) times daily.  . diclofenac sodium (VOLTAREN) 1 % GEL Apply 4 g topically 4 (four) times daily.  Marland Kitchen docusate sodium (COLACE) 100 MG capsule Take 1 capsule by mouth daily as needed.  . hydrochlorothiazide (HYDRODIURIL) 25 MG tablet TAKE 1 TABLET(25 MG) BY MOUTH DAILY  . hydroxyurea (HYDREA) 500 MG capsule Take 500 mg by mouth as directed. 2 tablets 3 days a week, and 1 tablet all other days  . Multiple Vitamins-Minerals (MULTIVITAMIN ADULT PO) Take 1 tablet by mouth daily.  . naproxen (NAPROSYN) 500 MG tablet TAKE 1 TABLET(500 MG) BY MOUTH TWICE DAILY WITH A MEAL  . oxyCODONE-acetaminophen (PERCOCET) 10-325 MG tablet Take 1 tablet by mouth every 6 (  six) hours as needed for pain.  . sildenafil (VIAGRA) 50 MG tablet Take 0.5-1 tablets by mouth daily as needed.  Marland Kitchen UNABLE TO FIND Med Name: tumeric 500mg  twice a day  . valACYclovir (VALTREX) 1000 MG tablet Take 1 tablet (1,000 mg total) by mouth 2 (two) times daily as needed.   No facility-administered medications prior to visit.    Review of Systems  Constitutional: Negative for appetite change, chills and fever.  Respiratory: Negative for chest tightness, shortness of breath and wheezing.   Cardiovascular: Negative for chest pain and palpitations.  Gastrointestinal: Negative for abdominal pain, nausea and  vomiting.      Objective    BP 110/60 (BP Location: Left Arm, Patient Position: Sitting, Cuff Size: Normal)   Pulse 87   Temp (!) 97.1 F (36.2 C) (Temporal)   Resp 16   Wt 171 lb (77.6 kg)   SpO2 97% Comment: room air  BMI 24.54 kg/m    Physical Exam   General: Appearance:    Well developed, well nourished male in no acute distress  Eyes:    PERRL, conjunctiva/corneas clear, EOM's intact       Lungs:     Clear to auscultation bilaterally, respirations unlabored  Heart:    Normal heart rate. Normal rhythm. No murmurs, rubs, or gallops.   MS:   All extremities are intact.   Neurologic:   Awake, alert, oriented x 3. No apparent focal neurological           defect.         Results for orders placed or performed in visit on 08/15/19  POCT glycosylated hemoglobin (Hb A1C)  Result Value Ref Range   Hemoglobin A1C 6.0 (A) 4.0 - 5.6 %   Est. average glucose Bld gHb Est-mCnc 126     Assessment & Plan     1. Essential hypertension Well controlled.  Continue current medications.    2. Pre-diabetes Stable. Counseled regarding avoid simple carbohydrates. Check a1c q 6 months.   3. Primary osteoarthritis of both knees refill - oxyCODONE-acetaminophen (PERCOCET) 10-325 MG tablet; Take 1 tablet by mouth every 6 (six) hours as needed for pain.  Dispense: 120 tablet; Refill: 0 - diclofenac Sodium (VOLTAREN) 1 % GEL; Apply 4 g topically 4 (four) times daily as needed.  Dispense: 100 g; Refill: 5   Return in about 6 months (around 02/15/2020).      The entirety of the information documented in the History of Present Illness, Review of Systems and Physical Exam were personally obtained by me. Portions of this information were initially documented by the CMA and reviewed by me for thoroughness and accuracy.      Lelon Huh, MD  [facility] [phone] (phone) [phone] (fax)  [place]

## 2019-08-15 ENCOUNTER — Other Ambulatory Visit: Payer: Self-pay

## 2019-08-15 ENCOUNTER — Ambulatory Visit (INDEPENDENT_AMBULATORY_CARE_PROVIDER_SITE_OTHER): Payer: Medicare PPO | Admitting: Family Medicine

## 2019-08-15 ENCOUNTER — Encounter: Payer: Self-pay | Admitting: Family Medicine

## 2019-08-15 VITALS — BP 110/60 | HR 87 | Temp 97.1°F | Resp 16 | Wt 171.0 lb

## 2019-08-15 DIAGNOSIS — I10 Essential (primary) hypertension: Secondary | ICD-10-CM

## 2019-08-15 DIAGNOSIS — M170 Bilateral primary osteoarthritis of knee: Secondary | ICD-10-CM | POA: Diagnosis not present

## 2019-08-15 DIAGNOSIS — R7303 Prediabetes: Secondary | ICD-10-CM

## 2019-08-15 LAB — POCT GLYCOSYLATED HEMOGLOBIN (HGB A1C)
Est. average glucose Bld gHb Est-mCnc: 126
Hemoglobin A1C: 6 % — AB (ref 4.0–5.6)

## 2019-08-15 MED ORDER — DICLOFENAC SODIUM 1 % EX GEL: 4.0000 g | Freq: Four times a day (QID) | CUTANEOUS | 5 refills | Status: DC | PRN

## 2019-08-15 MED ORDER — OXYCODONE-ACETAMINOPHEN 10-325 MG PO TABS: 1.0000 | ORAL_TABLET | Freq: Four times a day (QID) | ORAL | 0 refills | Status: DC | PRN

## 2019-08-15 NOTE — Patient Instructions (Signed)
.   Please review the attached list of medications and notify my office if there are any errors.   . Please bring all of your medications to every appointment so we can make sure that our medication list is the same as yours.   

## 2019-09-11 ENCOUNTER — Other Ambulatory Visit: Payer: Self-pay | Admitting: Family Medicine

## 2019-09-11 DIAGNOSIS — M170 Bilateral primary osteoarthritis of knee: Secondary | ICD-10-CM

## 2019-09-11 NOTE — Telephone Encounter (Signed)
Last dispensed 30 day supply 08-15-2019

## 2019-09-11 NOTE — Telephone Encounter (Signed)
Medication Refill - Medication: oxycodone   Has the patient contacted their pharmacy? Yes.   (Agent: If no, request that the patient contact the pharmacy for the refill.) (Agent: If yes, when and what did the pharmacy advise?)  Preferred Pharmacy (with phone number or street name):  [facility] [id number], [place] - [address] AT [address]  [place] [address]  Phone: [phone] Fax: [phone]  Not a 24 hour pharmacy; exact hours not known.     Agent: Please be advised that RX refills may take up to 3 business days. We ask that you follow-up with your pharmacy.

## 2019-09-11 NOTE — Telephone Encounter (Signed)
Requested medication (s) are due for refill today: no  Requested medication (s) are on the active medication list: yes  Last refill:  08/15/2019  Future visit scheduled: yes  Notes to clinic:  this refill cannot be delegated    Requested Prescriptions  Pending Prescriptions Disp Refills   oxyCODONE-acetaminophen (PERCOCET) 10-325 MG tablet 120 tablet 0    Sig: Take 1 tablet by mouth every 6 (six) hours as needed for pain.      Not Delegated - Analgesics:  Opioid Agonist Combinations Failed - 09/11/2019  8:54 AM      Failed - This refill cannot be delegated      Failed - Urine Drug Screen completed in last 360 days.      Passed - Valid encounter within last 6 months    Recent Outpatient Visits           3 weeks ago Essential hypertension   [facility] Birdie Sons, MD   1 month ago Genital herpes simplex, unspecified site   [place], [name], MD   3 months ago Paronychia of great toe, left   [facility] Birdie Sons, MD   3 months ago Paronychia of great toe, left   [facility] Birdie Sons, MD   7 months ago Essential hypertension   [facility] Birdie Sons, MD       Future Appointments             In 5 months Fisher, [name], MD [facility], [place]

## 2019-09-12 MED ORDER — OXYCODONE-ACETAMINOPHEN 10-325 MG PO TABS: 1.0000 | ORAL_TABLET | Freq: Four times a day (QID) | ORAL | 0 refills | Status: DC | PRN

## 2019-09-12 NOTE — Telephone Encounter (Signed)
Patient called back to request his medication be expedited.

## 2019-09-15 ENCOUNTER — Other Ambulatory Visit: Payer: Self-pay | Admitting: Family Medicine

## 2019-09-15 DIAGNOSIS — I10 Essential (primary) hypertension: Secondary | ICD-10-CM

## 2019-09-15 NOTE — Telephone Encounter (Signed)
Approved per protocol.  Requested Prescriptions  Pending Prescriptions Disp Refills   amLODipine (NORVASC) 2.5 MG tablet [Pharmacy Med Name: AMLODIPINE BESYLATE 2.5MG  TABLETS] 90 tablet 1    Sig: TAKE 1 TABLET(2.5 MG) BY MOUTH DAILY     Cardiovascular:  Calcium Channel Blockers Passed - 09/15/2019  3:31 AM      Passed - Last BP in normal range    BP Readings from Last 1 Encounters:  08/15/19 110/60         Passed - Valid encounter within last 6 months    Recent Outpatient Visits          1 month ago Essential hypertension   [facility] Birdie Sons, MD   1 month ago Genital herpes simplex, unspecified site   [place], [name], MD   3 months ago Paronychia of great toe, left   [facility] Birdie Sons, MD   3 months ago Paronychia of great toe, left   [facility] Birdie Sons, MD   7 months ago Essential hypertension   [facility] Birdie Sons, MD      Future Appointments            In 5 months Fisher, [name], MD [facility], [place]

## 2019-10-09 ENCOUNTER — Other Ambulatory Visit: Payer: Self-pay | Admitting: Family Medicine

## 2019-10-09 DIAGNOSIS — M170 Bilateral primary osteoarthritis of knee: Secondary | ICD-10-CM

## 2019-10-09 MED ORDER — OXYCODONE-ACETAMINOPHEN 10-325 MG PO TABS: 1.0000 | ORAL_TABLET | Freq: Four times a day (QID) | ORAL | 0 refills | Status: DC | PRN

## 2019-10-09 NOTE — Telephone Encounter (Signed)
Medication Refill - Medication: oxyCODONE-acetaminophen (PERCOCET) 10-325 MG tablet   Pt will be out of town this weekend, needs refill soon so he is not without   Has the patient contacted their pharmacy? Yes.   (Agent: If no, request that the patient contact the pharmacy for the refill.) (Agent: If yes, when and what did the pharmacy advise?)  Preferred Pharmacy (with phone number or street name):  [facility] [place], [place] - [address] AT [address]  [place] [address]  Phone: [phone] Fax: [phone]     Agent: Please be advised that RX refills may take up to 3 business days. We ask that you follow-up with your pharmacy.

## 2019-10-11 DIAGNOSIS — D45 Polycythemia vera: Secondary | ICD-10-CM | POA: Diagnosis not present

## 2019-10-11 DIAGNOSIS — D72829 Elevated white blood cell count, unspecified: Secondary | ICD-10-CM | POA: Diagnosis not present

## 2019-11-05 ENCOUNTER — Other Ambulatory Visit: Payer: Self-pay | Admitting: Family Medicine

## 2019-11-05 ENCOUNTER — Ambulatory Visit: Payer: Self-pay

## 2019-11-05 DIAGNOSIS — M170 Bilateral primary osteoarthritis of knee: Secondary | ICD-10-CM

## 2019-11-05 MED ORDER — OXYCODONE-ACETAMINOPHEN 10-325 MG PO TABS: 1.0000 | ORAL_TABLET | Freq: Four times a day (QID) | ORAL | 0 refills | Status: DC | PRN

## 2019-11-05 NOTE — Telephone Encounter (Signed)
oxyCODONE-acetaminophen (PERCOCET) 10-325 MG tablet     Patient requesting refill.    Pharmacy:  [facility] DR[facility] STORE [place], [place] [address] AT [place] Phone:  [phone]  Fax:  [phone]

## 2019-11-05 NOTE — Telephone Encounter (Signed)
Pt being evicted and was moving his furniture out of house, when pt tripped on a vine in the yard. His right forearm and knee caught him form hitting the ground. Denies and cuts or scratches or bruising to knee but c/o moderate pain to his right shoulder and forearm. Pt stated mild edema noted but after icing ad tx  With OTC Tylenol, his ROM is better. Pt advised he may need to come for appt. Pt requesting a refill on his Percocet. Pt stated he has appt with Dr. Caryn Section this Friday. Advised pt to call back for any worsening sx (edema, pain).    Of note, pt has been evicted and due to be out of current domicile by Friday. Pt has no place to live and will be homeless after Friday. Pt stated he will discuss with PCP on Friday but wanted to get information to someone at office who could potentially help if available.      Reason for Disposition . [1] Caller has NON-URGENT question AND [2] triager unable to answer question  Answer Assessment - Initial Assessment Questions 1. MECHANISM: "How did the fall happen?"     Tripped on a vine in the yard 2. DOMESTIC VIOLENCE AND ELDER ABUSE SCREENING: "Did you fall because someone pushed you or tried to hurt you?" If Yes, ask: "Are you safe now?"     no 3. ONSET: "When did the fall happen?" (e.g., minutes, hours, or days ago)     Saturday am  4. LOCATION: "What part of the body hit the ground?" (e.g., back, buttocks, head, hips, knees, hands, head, stomach)     Caught himself with right forearm and right knee 5. INJURY: "Did you hurt (injure) yourself when you fell?" If Yes, ask: "What did you injure? Tell me more about this?" (e.g., body area; type of injury; pain severity)"     C/o right forearm pain and shoulder stiffness. Has been treating with ice and Tylenol and stated that his arm has better ROM.  6. PAIN: "Is there any pain?" If Yes, ask: "How bad is the pain?" (e.g., Scale 1-10; or mild,  moderate, severe)   - NONE (0): no pain   - MILD (1-3):  doesn't interfere with normal activities    - MODERATE (4-7): interferes with normal activities or awakens from sleep    - SEVERE (8-10): excruciating pain, unable to do any normal activities      moderate 7. SIZE: For cuts, bruises, or swelling, ask: "How large is it?" (e.g., inches or centimeters)      Has a sctratch on top of head from a bush - stated it was a small scratch 8. PREGNANCY: "Is there any chance you are pregnant?" "When was your last menstrual period?"     n/a 9. OTHER SYMPTOMS: "Do you have any other symptoms?" (e.g., dizziness, fever, weakness; new onset or worsening).      no 10. CAUSE: "What do you think caused the fall (or falling)?" (e.g., tripped, dizzy spell)       tripped  Protocols used: FALLS AND FALLING-A-AH

## 2019-11-05 NOTE — Telephone Encounter (Signed)
Requested medication (s) are due for refill today: no  Requested medication (s) are on the active medication list: yes  Last refill:  10/09/19  Future visit scheduled: yes  Notes to clinic:  med not delegated to NT to RF   Requested Prescriptions  Pending Prescriptions Disp Refills   oxyCODONE-acetaminophen (PERCOCET) 10-325 MG tablet 120 tablet 0    Sig: Take 1 tablet by mouth every 6 (six) hours as needed for pain.      Not Delegated - Analgesics:  Opioid Agonist Combinations Failed - 11/05/2019  8:37 AM      Failed - This refill cannot be delegated      Failed - Urine Drug Screen completed in last 360 days.      Passed - Valid encounter within last 6 months    Recent Outpatient Visits           2 months ago Essential hypertension   [facility] Birdie Sons, MD   3 months ago Genital herpes simplex, unspecified site   [place], [name], MD   5 months ago Paronychia of great toe, left   [facility] Birdie Sons, MD   5 months ago Paronychia of great toe, left   [facility] Birdie Sons, MD   9 months ago Essential hypertension   [facility] Birdie Sons, MD       Future Appointments             In 4 days Fisher, [name], MD [facility], [place]   In 3 months Fisher, [name], MD [facility], [place]

## 2019-11-06 ENCOUNTER — Ambulatory Visit (INDEPENDENT_AMBULATORY_CARE_PROVIDER_SITE_OTHER): Payer: Medicare HMO | Admitting: Family Medicine

## 2019-11-06 ENCOUNTER — Telehealth: Payer: Self-pay

## 2019-11-06 ENCOUNTER — Other Ambulatory Visit: Payer: Self-pay

## 2019-11-06 ENCOUNTER — Encounter: Payer: Self-pay | Admitting: Family Medicine

## 2019-11-06 ENCOUNTER — Ambulatory Visit
Admission: RE | Admit: 2019-11-06 | Discharge: 2019-11-06 | Disposition: A | Discharge status: Home / Self Care | Payer: Medicare HMO | Attending: Family Medicine | Admitting: Family Medicine

## 2019-11-06 ENCOUNTER — Ambulatory Visit
Admission: RE | Admit: 2019-11-06 | Discharge: 2019-11-06 | Disposition: A | Discharge status: Home / Self Care | Payer: Medicare HMO | Source: Ambulatory Visit | Attending: Family Medicine | Admitting: Family Medicine

## 2019-11-06 VITALS — BP 129/74 | HR 91 | Temp 96.8°F | Wt 167.0 lb

## 2019-11-06 DIAGNOSIS — M25562 Pain in left knee: Secondary | ICD-10-CM

## 2019-11-06 DIAGNOSIS — M25521 Pain in right elbow: Secondary | ICD-10-CM | POA: Diagnosis not present

## 2019-11-06 DIAGNOSIS — M1712 Unilateral primary osteoarthritis, left knee: Secondary | ICD-10-CM | POA: Diagnosis not present

## 2019-11-06 DIAGNOSIS — M25462 Effusion, left knee: Secondary | ICD-10-CM | POA: Diagnosis not present

## 2019-11-06 DIAGNOSIS — M7989 Other specified soft tissue disorders: Secondary | ICD-10-CM | POA: Diagnosis not present

## 2019-11-06 DIAGNOSIS — S59901A Unspecified injury of right elbow, initial encounter: Secondary | ICD-10-CM | POA: Diagnosis not present

## 2019-11-06 DIAGNOSIS — M778 Other enthesopathies, not elsewhere classified: Secondary | ICD-10-CM | POA: Diagnosis not present

## 2019-11-06 NOTE — Progress Notes (Signed)
Established patient visit   Patient: Jerry Mcpherson   DOB: 1940-06-11   79 y.o. Male  MRN: [id number] Visit Date: 11/06/2019  Today's healthcare provider: Lelon Huh, MD   Chief Complaint  Patient presents with  . Fall   Subjective    Fall The accident occurred 3 to 5 days ago (Pt reports falling Saturday 11/03/2019). The fall occurred while walking (Pt reports tripping over a vine. ). The point of impact was the left knee and right elbow. The pain is present in the right elbow and left knee. Pertinent negatives include no abdominal pain, headaches, loss of consciousness, nausea, numbness, tingling, visual change or vomiting. He has tried NSAID for the symptoms. The treatment provided mild relief.   His right elbow and forearm have been getting progressively more swollen the last few days, but he is able to move his shoulder wrist and fingers without difficulty. He Is able to fully extend his right elbow, but can only flex about 90 degrees due to swelling. His knee is sore but is able to bear weight and walk with mild limp.       Medications: Outpatient Medications Prior to Visit  Medication Sig  . acetaminophen (TYLENOL) 500 MG tablet Take 2 tablets (1,000 mg total) by mouth every 6 (six) hours as needed for mild pain or moderate pain.  Marland Kitchen amLODipine (NORVASC) 2.5 MG tablet TAKE 1 TABLET(2.5 MG) BY MOUTH DAILY  . Ascorbic Acid (VITAMIN C) 1000 MG tablet Take 2 tablets by mouth daily.  Marland Kitchen aspirin 81 MG tablet Take 2 tablets daily (Patient taking differently: 81 mg 2 (two) times daily. )  . budesonide-formoterol (SYMBICORT) 160-4.5 MCG/ACT inhaler Inhale 2 puffs into the lungs 2 (two) times daily.  . diclofenac Sodium (VOLTAREN) 1 % GEL Apply 4 g topically 4 (four) times daily as needed.  . docusate sodium (COLACE) 100 MG capsule Take 1 capsule by mouth daily as needed.  . hydrochlorothiazide (HYDRODIURIL) 25 MG tablet TAKE 1 TABLET(25 MG) BY MOUTH DAILY  . hydroxyurea  (HYDREA) 500 MG capsule Take 500 mg by mouth as directed. 2 tablets 3 days a week, and 1 tablet all other days  . Multiple Vitamins-Minerals (MULTIVITAMIN ADULT PO) Take 1 tablet by mouth daily.  . naproxen (NAPROSYN) 500 MG tablet TAKE 1 TABLET(500 MG) BY MOUTH TWICE DAILY WITH A MEAL  . oxyCODONE-acetaminophen (PERCOCET) 10-325 MG tablet Take 1 tablet by mouth every 6 (six) hours as needed for pain.  . sildenafil (VIAGRA) 50 MG tablet Take 0.5-1 tablets by mouth daily as needed.  Marland Kitchen UNABLE TO FIND Med Name: tumeric 500mg  twice a day  . valACYclovir (VALTREX) 1000 MG tablet Take 1 tablet (1,000 mg total) by mouth 2 (two) times daily as needed.   No facility-administered medications prior to visit.    Review of Systems  Constitutional: Negative.   Gastrointestinal: Negative.  Negative for abdominal pain, nausea and vomiting.  Musculoskeletal: Positive for arthralgias, back pain and joint swelling. Negative for gait problem, myalgias, neck pain and neck stiffness.  Neurological: Negative for dizziness, tingling, loss of consciousness, light-headedness, numbness and headaches.      Objective    BP (!) 129/74 (BP Location: Left Arm, Patient Position: Sitting, Cuff Size: Normal)   Pulse 91   Temp (!) 96.8 F (36 C) (Temporal)   Wt 167 lb (75.8 kg)   BMI 23.96 kg/m    Physical Exam  Moderately swollen right elbow and dorsal forearm. ROM as described in  HPI. No broken skin or erythema. Is tender along length of dorsal forearm. Left knee with mild abrasion and mild pre-patellar swelling and tenderness.   No results found for any visits on 11/06/19.  Assessment & Plan     1. Acute pain of left knee  - DG Knee Complete 4 Views Left; Future  2. Right elbow pain Advised to keep elevated as much as possible. May take OTC NSAIDs as per AVS.  - DG Elbow Complete Right; Future   No follow-ups on file.      The entirety of the information documented in the History of Present Illness,  Review of Systems and Physical Exam were personally obtained by me. Portions of this information were initially documented by the CMA and reviewed by me for thoroughness and accuracy.      Lelon Huh, MD  [facility] [phone] (phone) [phone] (fax)  [place]

## 2019-11-06 NOTE — Patient Instructions (Addendum)
.   You take three ibuprofen every 4-6 hours, but do NOT take ibuprofen in combination with naproxen  . Go to the [facility] on [facility] for right elbow and left knee Xray

## 2019-11-06 NOTE — Telephone Encounter (Signed)
Can we work him in anywhere?  Thanks,   -[name]

## 2019-11-06 NOTE — Telephone Encounter (Signed)
Copied from [place] [id number]. Topic: General - Other >> Nov 06, 2019  9:14 AM Celene Kras wrote: Reason for CRM: Pt called stating that his arm is swelling more than before due to his fall. He is requesting to be seen today. Please advise.

## 2019-11-06 NOTE — Telephone Encounter (Signed)
You can put him in at 3:20 today. The 3:00pm is a Medicare patient, so it's not really going to be a physical.

## 2019-11-06 NOTE — Telephone Encounter (Signed)
Apt for 3:20 today. Pt advised.   Thanks,   -[name]

## 2019-11-07 ENCOUNTER — Telehealth: Payer: Self-pay

## 2019-11-07 NOTE — Telephone Encounter (Signed)
Patient was requesting X-ray results. Patient has been advised

## 2019-11-07 NOTE — Progress Notes (Deleted)
     Established patient visit   Patient: Tory Septer Millhouse   DOB: 1941-03-23   79 y.o. Male  MRN: [id number] Visit Date: 11/09/2019  Today's healthcare provider: Lelon Huh, MD   No chief complaint on file.  Subjective    Fall    ***  {Show patient history (optional):23778::" "}   Medications: Outpatient Medications Prior to Visit  Medication Sig  . acetaminophen (TYLENOL) 500 MG tablet Take 2 tablets (1,000 mg total) by mouth every 6 (six) hours as needed for mild pain or moderate pain.  Marland Kitchen amLODipine (NORVASC) 2.5 MG tablet TAKE 1 TABLET(2.5 MG) BY MOUTH DAILY  . Ascorbic Acid (VITAMIN C) 1000 MG tablet Take 2 tablets by mouth daily.  Marland Kitchen aspirin 81 MG tablet Take 2 tablets daily (Patient taking differently: 81 mg 2 (two) times daily. )  . budesonide-formoterol (SYMBICORT) 160-4.5 MCG/ACT inhaler Inhale 2 puffs into the lungs 2 (two) times daily.  . diclofenac Sodium (VOLTAREN) 1 % GEL Apply 4 g topically 4 (four) times daily as needed.  . docusate sodium (COLACE) 100 MG capsule Take 1 capsule by mouth daily as needed.  . hydrochlorothiazide (HYDRODIURIL) 25 MG tablet TAKE 1 TABLET(25 MG) BY MOUTH DAILY  . hydroxyurea (HYDREA) 500 MG capsule Take 500 mg by mouth as directed. 2 tablets 3 days a week, and 1 tablet all other days  . Multiple Vitamins-Minerals (MULTIVITAMIN ADULT PO) Take 1 tablet by mouth daily.  . naproxen (NAPROSYN) 500 MG tablet TAKE 1 TABLET(500 MG) BY MOUTH TWICE DAILY WITH A MEAL  . oxyCODONE-acetaminophen (PERCOCET) 10-325 MG tablet Take 1 tablet by mouth every 6 (six) hours as needed for pain.  . sildenafil (VIAGRA) 50 MG tablet Take 0.5-1 tablets by mouth daily as needed.  Marland Kitchen UNABLE TO FIND Med Name: tumeric 500mg  twice a day  . valACYclovir (VALTREX) 1000 MG tablet Take 1 tablet (1,000 mg total) by mouth 2 (two) times daily as needed.   No facility-administered medications prior to visit.    Review of Systems  {Heme  Chem  Endocrine  Serology   Results Review (optional):23779::" "}  Objective    There were no vitals taken for this visit. {Show previous vital signs (optional):23777::" "}  Physical Exam  ***  No results found for any visits on 11/09/19.  Assessment & Plan     ***  No follow-ups on file.      {provider attestation***:1}   Lelon Huh, MD  [facility] [phone] (phone) [phone] (fax)  [place]

## 2019-11-07 NOTE — Telephone Encounter (Signed)
Copied from [place] [id number]. Topic: General - Other >> Nov 07, 2019 10:50 AM Celene Kras wrote: Reason for CRM: Pt calling and is requesting to have a call back with his lab results. Please advise.

## 2019-11-09 ENCOUNTER — Ambulatory Visit: Payer: Medicare PPO | Admitting: Family Medicine

## 2019-11-29 ENCOUNTER — Other Ambulatory Visit: Payer: Self-pay | Admitting: Family Medicine

## 2019-11-29 DIAGNOSIS — M170 Bilateral primary osteoarthritis of knee: Secondary | ICD-10-CM

## 2019-11-29 NOTE — Telephone Encounter (Signed)
PT need a refill  oxyCODONE-acetaminophen (PERCOCET) 10-325 MG tablet [[id number]]  [place], [place] [address] AT [place]  [place] [address]  Phone: [phone] Fax: [phone]

## 2019-11-29 NOTE — Telephone Encounter (Signed)
PCP has been checking his own refills this week

## 2019-11-29 NOTE — Telephone Encounter (Signed)
Requested medication (s) are due for refill today: yes  Requested medication (s) are on the active medication list: yes  Last refill:  11/05/2019  Future visit scheduled:  yes  Notes to clinic:  this refill cannot be delegated    Requested Prescriptions  Pending Prescriptions Disp Refills   oxyCODONE-acetaminophen (PERCOCET) 10-325 MG tablet 120 tablet 0    Sig: Take 1 tablet by mouth every 6 (six) hours as needed for pain.      Not Delegated - Analgesics:  Opioid Agonist Combinations Failed - 11/29/2019 11:11 AM      Failed - This refill cannot be delegated      Failed - Urine Drug Screen completed in last 360 days.      Passed - Valid encounter within last 6 months    Recent Outpatient Visits           3 weeks ago Acute pain of left knee   [facility] Birdie Sons, MD   3 months ago Essential hypertension   [facility] Birdie Sons, MD   4 months ago Genital herpes simplex, unspecified site   [place], [name], MD   5 months ago Paronychia of great toe, left   [facility] Birdie Sons, MD   6 months ago Paronychia of great toe, left   [facility] Birdie Sons, MD       Future Appointments             In 2 months Fisher, [name], MD [facility], [place]

## 2019-11-30 ENCOUNTER — Telehealth: Payer: Self-pay

## 2019-11-30 NOTE — Telephone Encounter (Signed)
Refill request has been sent to fisher, I seen where in previous message he has been doing his own refill requests. So I advised patient to check back later to see if his medication has been sent to the pharmacy.

## 2019-11-30 NOTE — Telephone Encounter (Signed)
30 days supply dispensed 11-06-2019

## 2019-11-30 NOTE — Telephone Encounter (Signed)
Copied from [place] [phone]. Topic: General - Other >> Nov 30, 2019  9:59 AM Hinda Lenis D wrote: PT asking to speak with a medical team member about his refill /  oxyCODONE-acetaminophen (PERCOCET) 10-325 MG tablet [[id number]] please advise

## 2019-12-03 MED ORDER — OXYCODONE-ACETAMINOPHEN 10-325 MG PO TABS: 1.0000 | ORAL_TABLET | Freq: Four times a day (QID) | ORAL | 0 refills | Status: DC | PRN

## 2019-12-03 NOTE — Telephone Encounter (Signed)
Pt has called back asking about his refill on the pain medications  CB#  [phone]

## 2019-12-13 ENCOUNTER — Other Ambulatory Visit: Payer: Self-pay | Admitting: Family Medicine

## 2019-12-13 DIAGNOSIS — I10 Essential (primary) hypertension: Secondary | ICD-10-CM

## 2019-12-31 ENCOUNTER — Other Ambulatory Visit: Payer: Self-pay | Admitting: Family Medicine

## 2019-12-31 DIAGNOSIS — M170 Bilateral primary osteoarthritis of knee: Secondary | ICD-10-CM

## 2019-12-31 NOTE — Telephone Encounter (Signed)
Requested medication (s) are due for refill today: Due 01/03/20  Requested medication (s) are on the active medication list: yes  Last refill: 12/03/19  #120  0 refills  Future visit scheduled  yes 02/15/20  Notes to clinic not delegated  Requested Prescriptions  Pending Prescriptions Disp Refills   oxyCODONE-acetaminophen (PERCOCET) 10-325 MG tablet 120 tablet 0    Sig: Take 1 tablet by mouth every 6 (six) hours as needed for pain.      Not Delegated - Analgesics:  Opioid Agonist Combinations Failed - 12/31/2019  8:59 AM      Failed - This refill cannot be delegated      Failed - Urine Drug Screen completed in last 360 days.      Passed - Valid encounter within last 6 months    Recent Outpatient Visits           1 month ago Acute pain of left knee   [facility] Birdie Sons, MD   4 months ago Essential hypertension   [facility] Birdie Sons, MD   5 months ago Genital herpes simplex, unspecified site   [place], [name], MD   6 months ago Paronychia of great toe, left   [facility] Birdie Sons, MD   7 months ago Paronychia of great toe, left   [facility] Birdie Sons, MD       Future Appointments             In 1 month Fisher, [name], MD [facility], [place]

## 2019-12-31 NOTE — Telephone Encounter (Signed)
Pt called checking on his refill for pain medication.  He said he could not hear me and then the call dropped.

## 2019-12-31 NOTE — Telephone Encounter (Signed)
Pt need a refill  oxyCODONE-acetaminophen (PERCOCET) 10-325 MG tablet [[id number]]  [id number] [place] [address] [place]  [place] [address]  Phone: [phone] Fax: [phone]

## 2020-01-01 NOTE — Telephone Encounter (Signed)
Patient is going out of town today due to an emergency and will not return prior to refill being due. Patient would like to know if PCP will approve an early refill. Patient states arm is improving but still in pain.

## 2020-01-01 NOTE — Telephone Encounter (Signed)
Last dispensed 30 day supply 2 days early on 12/04/2019.

## 2020-01-03 ENCOUNTER — Telehealth: Payer: Self-pay

## 2020-01-03 MED ORDER — OXYCODONE-ACETAMINOPHEN 10-325 MG PO TABS: 1.0000 | ORAL_TABLET | Freq: Four times a day (QID) | ORAL | 0 refills | Status: DC | PRN

## 2020-01-03 NOTE — Telephone Encounter (Signed)
Pt called to see if there was an issue with the refill request / Pt was advised of Dr. Caryn Section being out of the office/ and it may be sent to another provider / please call Pt today and advise

## 2020-01-03 NOTE — Telephone Encounter (Signed)
Patient is trying to go out of town tomorrow and needs his oxycodone-acet 10-325 mg. Before he goes.  He said he has been trying since 12/31/19 to get it refilled.  I see where Dr. Caryn Section said he was too early to get it on 12/31/19. Will you check to see if he can get it now?  [facility] [place]

## 2020-01-04 NOTE — Telephone Encounter (Signed)
Medication has already been sent to the pharmacy. °

## 2020-01-09 ENCOUNTER — Telehealth: Payer: Self-pay

## 2020-01-09 ENCOUNTER — Encounter: Payer: Self-pay | Admitting: Family Medicine

## 2020-01-09 ENCOUNTER — Ambulatory Visit (INDEPENDENT_AMBULATORY_CARE_PROVIDER_SITE_OTHER): Payer: Medicare HMO | Admitting: Family Medicine

## 2020-01-09 ENCOUNTER — Other Ambulatory Visit: Payer: Self-pay

## 2020-01-09 VITALS — BP 171/86 | HR 73 | Temp 98.2°F | Resp 16 | Wt 164.0 lb

## 2020-01-09 DIAGNOSIS — R102 Pelvic and perineal pain: Secondary | ICD-10-CM | POA: Diagnosis not present

## 2020-01-09 DIAGNOSIS — R300 Dysuria: Secondary | ICD-10-CM

## 2020-01-09 DIAGNOSIS — R3129 Other microscopic hematuria: Secondary | ICD-10-CM

## 2020-01-09 DIAGNOSIS — Z23 Encounter for immunization: Secondary | ICD-10-CM

## 2020-01-09 LAB — POCT URINALYSIS DIPSTICK
Bilirubin, UA: NEGATIVE
Glucose, UA: NEGATIVE
Ketones, UA: NEGATIVE
Leukocytes, UA: NEGATIVE
Nitrite, UA: NEGATIVE
Protein, UA: NEGATIVE
Spec Grav, UA: 1.02 (ref 1.010–1.025)
Urobilinogen, UA: 0.2 E.U./dL
pH, UA: 6 (ref 5.0–8.0)

## 2020-01-09 NOTE — Progress Notes (Signed)
I,Roshena L Chambers,acting as a scribe for Lelon Huh, MD.,have documented all relevant documentation on the behalf of Lelon Huh, MD,as directed by  Lelon Huh, MD while in the presence of Lelon Huh, MD. Established patient visit   Patient: Jerry Mcpherson   DOB: 1940/06/11   79 y.o. Male  MRN: [id number] Visit Date: 01/09/2020  Today's healthcare provider: Lelon Huh, MD   Chief Complaint  Patient presents with  . Dysuria   Subjective      Dysuria This is a new problem. Episode onset: 1 week ago. The problem has been waxing and waning. The quality of the pain is described as burning. There has been no fever. Associated symptoms include flank pain, frequency, hematuria and urgency. Pertinent negatives include no chills, discharge, hesitancy, nausea or vomiting. He has tried NSAIDs for the symptoms. The treatment provided mild relief. Has also had pain on left suprapubic region. Has improved a bit since. He see bright red blood in urine 2 days ago.   He did have kidney stones in the 1980s     Medications: Outpatient Medications Prior to Visit  Medication Sig  . acetaminophen (TYLENOL) 500 MG tablet Take 2 tablets (1,000 mg total) by mouth every 6 (six) hours as needed for mild pain or moderate pain.  Marland Kitchen amLODipine (NORVASC) 2.5 MG tablet TAKE 1 TABLET(2.5 MG) BY MOUTH DAILY  . Ascorbic Acid (VITAMIN C) 1000 MG tablet Take 2 tablets by mouth daily.  Marland Kitchen aspirin 81 MG tablet Take 2 tablets daily (Patient taking differently: 81 mg 2 (two) times daily. )  . budesonide-formoterol (SYMBICORT) 160-4.5 MCG/ACT inhaler Inhale 2 puffs into the lungs 2 (two) times daily.  . diclofenac Sodium (VOLTAREN) 1 % GEL Apply 4 g topically 4 (four) times daily as needed.  . docusate sodium (COLACE) 100 MG capsule Take 1 capsule by mouth daily as needed.  . hydrochlorothiazide (HYDRODIURIL) 25 MG tablet TAKE 1 TABLET(25 MG) BY MOUTH DAILY  . hydroxyurea (HYDREA) 500 MG capsule  Take 500 mg by mouth as directed. 2 tablets 3 days a week, and 1 tablet all other days  . Multiple Vitamins-Minerals (MULTIVITAMIN ADULT PO) Take 1 tablet by mouth daily.  . naproxen (NAPROSYN) 500 MG tablet TAKE 1 TABLET(500 MG) BY MOUTH TWICE DAILY WITH A MEAL  . oxyCODONE-acetaminophen (PERCOCET) 10-325 MG tablet Take 1 tablet by mouth every 6 (six) hours as needed for pain.  . sildenafil (VIAGRA) 50 MG tablet Take 0.5-1 tablets by mouth daily as needed.  Marland Kitchen UNABLE TO FIND Med Name: tumeric 500mg  twice a day  . valACYclovir (VALTREX) 1000 MG tablet Take 1 tablet (1,000 mg total) by mouth 2 (two) times daily as needed.   No facility-administered medications prior to visit.    Review of Systems  Constitutional: Positive for fatigue. Negative for appetite change, chills and fever.  Respiratory: Negative for chest tightness, shortness of breath and wheezing.   Cardiovascular: Negative for chest pain and palpitations.  Gastrointestinal: Negative for abdominal pain, nausea and vomiting.  Genitourinary: Positive for dysuria, flank pain, frequency, hematuria and urgency. Negative for hesitancy.      Objective    BP (!) 171/86 (BP Location: Left Arm, Patient Position: Sitting, Cuff Size: Normal)   Pulse 73   Temp 98.2 F (36.8 C) (Oral)   Resp 16   Wt 164 lb (74.4 kg)   SpO2 100% Comment: room air  BMI 23.53 kg/m    Physical Exam  General Appearance:  Well developed, well nourished male, alert, cooperative, in no acute distress  Eyes:    PERRL, conjunctiva/corneas clear, EOM's intact       Lungs:     Clear to auscultation bilaterally, respirations unlabored  Heart:    Normal heart rate. Normal rhythm. No murmurs, rubs, or gallops.   Abdomen:   bowel sounds present and normal in all 4 quadrants, soft, round. Tender left suprapubic area. No CVA tenderness     Results for orders placed or performed in visit on 01/09/20  POCT Urinalysis Dipstick  Result Value Ref Range   Color, UA  yellow    Clarity, UA clear    Glucose, UA Negative Negative   Bilirubin, UA negative    Ketones, UA negative    Spec Grav, UA 1.020 1.010 - 1.025   Blood, UA moderate (non-hemolyzed)    pH, UA 6.0 5.0 - 8.0   Protein, UA Negative Negative   Urobilinogen, UA 0.2 0.2 or 1.0 E.U./dL   Nitrite, UA negative    Leukocytes, UA Negative Negative   Appearance     Odor      Assessment & Plan     1. Dysuria   2. Other microscopic hematuria  - Urine Culture  3. Suprapubic pain  Symptoms have improved over the last couple of days and has been several days since hematuria. Suspect he may have passed a renal stone as he does have a remote history of kidney stones. If urine culture is negative will consider renal and bladder imaging if symptoms do not resolve with then next 7-10 days.   4. Need for influenza vaccination  - Flu Vaccine QUAD High Dose IM (Fluad)   No follow-ups on file.      The entirety of the information documented in the History of Present Illness, Review of Systems and Physical Exam were personally obtained by me. Portions of this information were initially documented by the CMA and reviewed by me for thoroughness and accuracy.      Lelon Huh, MD  [facility] [phone] (phone) [phone] (fax)  [place]

## 2020-01-09 NOTE — Telephone Encounter (Signed)
Copied from [place] [phone]. Topic: General - Other >> Jan 09, 2020  1:04 PM Keene Breath wrote: Reason for CRM: Patient called to ask the doctor or nurse to call him regarding his uti.  He stated that a long time ago a doctor had tod him that if he drinks a beer once a week, this would help with the UTI symptoms.  Patient would like to know if this is correct.  Please call patient to discuss at [phone]

## 2020-01-09 NOTE — Telephone Encounter (Signed)
He probably doesn't have a UTI, on the hand, drinking one beer a week doesn't really have any down side.

## 2020-01-10 ENCOUNTER — Telehealth: Payer: Self-pay

## 2020-01-10 DIAGNOSIS — D45 Polycythemia vera: Secondary | ICD-10-CM | POA: Diagnosis not present

## 2020-01-10 DIAGNOSIS — Z1589 Genetic susceptibility to other disease: Secondary | ICD-10-CM | POA: Diagnosis not present

## 2020-01-10 DIAGNOSIS — D72829 Elevated white blood cell count, unspecified: Secondary | ICD-10-CM | POA: Diagnosis not present

## 2020-01-10 NOTE — Telephone Encounter (Signed)
Copied from [place] [phone]. Topic: General - Other >> Jan 10, 2020 11:53 AM Alanda Slim E wrote: Reason for CRM: Pt called to see about the results from his urine culture/ Pt asked for a call when they come in and stated he is still in pain/ please advise

## 2020-01-10 NOTE — Telephone Encounter (Signed)
Spoke with patient on the phone and advised him that culture can take 48-72hrs for bacteria to grow if present and that report is not back yet. Patient states that he

## 2020-01-11 ENCOUNTER — Ambulatory Visit: Payer: Self-pay | Admitting: *Deleted

## 2020-01-11 ENCOUNTER — Telehealth: Payer: Self-pay | Admitting: Family Medicine

## 2020-01-11 NOTE — Telephone Encounter (Signed)
Message is being resolved in different message encounter.

## 2020-01-11 NOTE — Telephone Encounter (Signed)
See other telephone message 

## 2020-01-11 NOTE — Telephone Encounter (Signed)
Urine culture was ordered 01/09/20 but there are still no results. Was culture sent?

## 2020-01-11 NOTE — Telephone Encounter (Signed)
Patient called to review lab results from 01/09/20. No results noted at this time. Reinforced to patient results may take up to 72 hours for final results. Patient reports he has taken OTC AZO and pain in back has decreased but requested to be notified of results. Reviewed with patient office will notify him as soon as possible. Patient verbalized understanding.

## 2020-01-12 ENCOUNTER — Other Ambulatory Visit: Payer: Self-pay | Admitting: Family Medicine

## 2020-01-12 DIAGNOSIS — R3129 Other microscopic hematuria: Secondary | ICD-10-CM

## 2020-01-12 DIAGNOSIS — R102 Pelvic and perineal pain: Secondary | ICD-10-CM

## 2020-01-12 DIAGNOSIS — R109 Unspecified abdominal pain: Secondary | ICD-10-CM

## 2020-01-12 LAB — URINE CULTURE

## 2020-01-14 ENCOUNTER — Telehealth: Payer: Self-pay | Admitting: Family Medicine

## 2020-01-14 NOTE — Telephone Encounter (Signed)
Is pain getter better, worse, or staying the same? Any more blood in the urine. Any chills, sweats, nausea, or vomiting?

## 2020-01-14 NOTE — Telephone Encounter (Signed)
Tried calling patient. No answer. Unable to leave message due to no voice message system.

## 2020-01-14 NOTE — Telephone Encounter (Signed)
Pt would like to be seen sooner than next Tuesday, would perhaps like to be seen at [facility] if he that means he can be seen sooner. Preferably before this Friday  Best contact: [phone]

## 2020-01-14 NOTE — Telephone Encounter (Signed)
Pt states he is drinking cranberry juice and taking OZO. He wants to know if there is anything else he can take for pain. His ultrasound in not until 01/22/20

## 2020-01-14 NOTE — Telephone Encounter (Signed)
PT is calling again in regards to this results, please FU ASAP at [phone] Has issue reception in his area and states will try to stay there until he gets a call back from nurse.

## 2020-01-14 NOTE — Telephone Encounter (Signed)
Tried calling patient. No answer. Unable to leave message due to now voice mailbox being set up.

## 2020-01-14 NOTE — Telephone Encounter (Signed)
See result note. Culture was negative, ultrasound had been ordered.

## 2020-01-16 ENCOUNTER — Other Ambulatory Visit: Payer: Self-pay

## 2020-01-16 ENCOUNTER — Other Ambulatory Visit: Payer: Self-pay | Admitting: Family Medicine

## 2020-01-16 ENCOUNTER — Telehealth: Payer: Self-pay

## 2020-01-16 ENCOUNTER — Ambulatory Visit
Admission: RE | Admit: 2020-01-16 | Discharge: 2020-01-16 | Disposition: A | Discharge status: Home / Self Care | Payer: Medicare HMO | Source: Ambulatory Visit | Attending: Family Medicine | Admitting: Family Medicine

## 2020-01-16 DIAGNOSIS — N4289 Other specified disorders of prostate: Secondary | ICD-10-CM

## 2020-01-16 DIAGNOSIS — R311 Benign essential microscopic hematuria: Secondary | ICD-10-CM

## 2020-01-16 DIAGNOSIS — R102 Pelvic and perineal pain: Secondary | ICD-10-CM | POA: Diagnosis not present

## 2020-01-16 DIAGNOSIS — R109 Unspecified abdominal pain: Secondary | ICD-10-CM

## 2020-01-16 DIAGNOSIS — R3129 Other microscopic hematuria: Secondary | ICD-10-CM

## 2020-01-16 DIAGNOSIS — N3289 Other specified disorders of bladder: Secondary | ICD-10-CM

## 2020-01-16 NOTE — Telephone Encounter (Signed)
Copied from [place] [phone]. Topic: General - Other >> Jan 16, 2020  1:53 PM Rainey Pines A wrote: Patient requesting callback from [place] in regards to if he has viewed the ct scan results from [place]. Please advise

## 2020-01-16 NOTE — Telephone Encounter (Signed)
Tracey with [facility] called to give a call report for patient's Renal US. Please review impression below. Results are also in chart. This is Fisher's patient.   IMPRESSION: 1. Heterogeneous soft tissue mass with internal vascularity located within the urinary bladder measuring up to 3.6 cm, concerning for bladder neoplasm. Urologic consultation and correlation with direct visualization is recommended. Mass closely approximates the adjacent prostate gland and could potentially represent soft tissue extension from the prostate. Correlate with serum PSA. 2. No evidence of obstructive uropathy of either kidney. 3. Mildly enlarged spleen

## 2020-01-16 NOTE — Telephone Encounter (Signed)
Called patient and no answer and unable to leave VM  I have placed referral to Urology for patient and have also ordered a PSA. He can come at his convenience for labs.   Will send note back to Dr. Caryn Section and his nurse team to try to get in touch with patient.   Needs to know there is a mass in his bladder that needs to be further evaluated. Referral to Urology has been placed. Also possible soft tissue mass around prostate. Have ordered lab for him to have checked at his convenience. He could also wait on the lab and Urology could check this as well.

## 2020-01-17 ENCOUNTER — Telehealth: Payer: Self-pay

## 2020-01-17 DIAGNOSIS — R310 Gross hematuria: Secondary | ICD-10-CM | POA: Insufficient documentation

## 2020-01-17 DIAGNOSIS — N3289 Other specified disorders of bladder: Secondary | ICD-10-CM | POA: Insufficient documentation

## 2020-01-17 NOTE — Telephone Encounter (Signed)
-----   Message from Birdie Sons, MD sent at 01/17/2020  9:08 AM EDT ----- Some type of mass is present in his bladder. He needs referral to urologist for further evaluation. Didn't see any stones.

## 2020-01-17 NOTE — Telephone Encounter (Signed)
Pt calling back this am to go over results. Info sent to Dr Caryn Section nurse to get in touch with pt.  Advised pt someone will call him back to go over the results.

## 2020-01-17 NOTE — Telephone Encounter (Signed)
Please see result note. He needs referral to urology for evaluation for bladder mass.

## 2020-01-17 NOTE — Telephone Encounter (Signed)
Copied from [place] [id number]. Topic: General - Other >> Jan 17, 2020  7:56 AM Hinda Lenis D wrote: PT returning call / lab results

## 2020-01-17 NOTE — Telephone Encounter (Signed)
Patient was advised of results.  States he would like to be referred to someone at [facility]

## 2020-01-18 ENCOUNTER — Telehealth: Payer: Self-pay

## 2020-01-18 NOTE — Telephone Encounter (Signed)
Copied from [place] [phone]. Topic: Referral - Medical Records >> Jan 18, 2020 11:04 AM Oneta Rack wrote: [facility]  [phone] option 1  requesting 01/16/2020 renal imaging please power share, any questions or concerns please advise.

## 2020-01-21 NOTE — Telephone Encounter (Signed)
I don't know what she is calling about, need more info

## 2020-01-22 ENCOUNTER — Ambulatory Visit: Payer: Medicare HMO

## 2020-01-23 DIAGNOSIS — N3289 Other specified disorders of bladder: Secondary | ICD-10-CM | POA: Diagnosis not present

## 2020-01-25 DIAGNOSIS — R918 Other nonspecific abnormal finding of lung field: Secondary | ICD-10-CM | POA: Diagnosis not present

## 2020-01-25 DIAGNOSIS — C679 Malignant neoplasm of bladder, unspecified: Secondary | ICD-10-CM | POA: Diagnosis not present

## 2020-01-25 DIAGNOSIS — N3289 Other specified disorders of bladder: Secondary | ICD-10-CM | POA: Diagnosis not present

## 2020-01-28 DIAGNOSIS — D751 Secondary polycythemia: Secondary | ICD-10-CM | POA: Diagnosis not present

## 2020-01-29 ENCOUNTER — Other Ambulatory Visit: Payer: Self-pay | Admitting: Family Medicine

## 2020-01-29 DIAGNOSIS — M170 Bilateral primary osteoarthritis of knee: Secondary | ICD-10-CM

## 2020-01-29 NOTE — Telephone Encounter (Signed)
Requested medication (s) are due for refill today:Due 02/02/20  Requested medication (s) are on the active medication list:yes  Last refill:  01/03/20  #120  0 refills  Future visit scheduled yes  02/18/20  Notes to clinic: not delegated  Requested Prescriptions  Pending Prescriptions Disp Refills   oxyCODONE-acetaminophen (PERCOCET) 10-325 MG tablet 120 tablet 0    Sig: Take 1 tablet by mouth every 6 (six) hours as needed for pain.      Not Delegated - Analgesics:  Opioid Agonist Combinations Failed - 01/29/2020 11:50 AM      Failed - This refill cannot be delegated      Failed - Urine Drug Screen completed in last 360 days.      Passed - Valid encounter within last 6 months    Recent Outpatient Visits           2 weeks ago [place], Donald E, MD   2 months ago Acute pain of left knee   [facility] Birdie Sons, MD   5 months ago Essential hypertension   [facility] Birdie Sons, MD   6 months ago Genital herpes simplex, unspecified site   [place], [name], MD   7 months ago Paronychia of great toe, left   [facility] Birdie Sons, MD       Future Appointments             In 2 weeks Fisher, [name], MD [facility], [place]

## 2020-01-29 NOTE — Telephone Encounter (Signed)
Pt is calling and need a refill on onxycodone. [facility] in [place] [address]. Pt did not call his pharm. Pt last visit with dr Caryn Section 01-09-2020

## 2020-01-30 DIAGNOSIS — C679 Malignant neoplasm of bladder, unspecified: Secondary | ICD-10-CM | POA: Insufficient documentation

## 2020-01-30 NOTE — Telephone Encounter (Signed)
Dispensed 30 day supply on 01-03-2020

## 2020-01-31 MED ORDER — OXYCODONE-ACETAMINOPHEN 10-325 MG PO TABS: 1.0000 | ORAL_TABLET | Freq: Four times a day (QID) | ORAL | 0 refills | Status: DC | PRN

## 2020-02-04 DIAGNOSIS — I10 Essential (primary) hypertension: Secondary | ICD-10-CM | POA: Diagnosis not present

## 2020-02-04 DIAGNOSIS — C678 Malignant neoplasm of overlapping sites of bladder: Secondary | ICD-10-CM | POA: Diagnosis not present

## 2020-02-04 DIAGNOSIS — M170 Bilateral primary osteoarthritis of knee: Secondary | ICD-10-CM | POA: Diagnosis not present

## 2020-02-04 DIAGNOSIS — M19041 Primary osteoarthritis, right hand: Secondary | ICD-10-CM | POA: Diagnosis not present

## 2020-02-04 DIAGNOSIS — Z538 Procedure and treatment not carried out for other reasons: Secondary | ICD-10-CM | POA: Diagnosis not present

## 2020-02-04 DIAGNOSIS — D471 Chronic myeloproliferative disease: Secondary | ICD-10-CM | POA: Diagnosis not present

## 2020-02-04 DIAGNOSIS — D45 Polycythemia vera: Secondary | ICD-10-CM | POA: Diagnosis not present

## 2020-02-04 DIAGNOSIS — Z87891 Personal history of nicotine dependence: Secondary | ICD-10-CM | POA: Diagnosis not present

## 2020-02-04 DIAGNOSIS — Z87442 Personal history of urinary calculi: Secondary | ICD-10-CM | POA: Diagnosis not present

## 2020-02-15 ENCOUNTER — Ambulatory Visit: Payer: Self-pay | Admitting: Family Medicine

## 2020-02-15 NOTE — Progress Notes (Signed)
Established patient visit   Patient: Jerry Mcpherson   DOB: 08-Oct-1940   79 y.o. Male  MRN: [id number] Visit Date: 02/18/2020  Today's healthcare provider: Lelon Huh, MD   Chief Complaint  Patient presents with  . Hypertension  . Pain Management  . Prediabetes   Subjective    HPI  Hypertension, follow-up  BP Readings from Last 3 Encounters:  02/18/20 (!) 131/56  01/09/20 (!) 171/86  11/06/19 (!) 129/74   Wt Readings from Last 3 Encounters:  02/18/20 166 lb (75.3 kg)  01/09/20 164 lb (74.4 kg)  11/06/19 167 lb (75.8 kg)     He was last seen for hypertension 6 months ago.  BP at that visit was 110/60. Management since that visit includes continuing same medication.  He reports good compliance with treatment. He is not having side effects.  He is following a Regular diet. He is not exercising. He does not smoke.  Use of agents associated with hypertension: NSAIDS.   Outside blood pressures are not checked. Symptoms: No chest pain No chest pressure  No palpitations No syncope  No dyspnea No orthopnea  No paroxysmal nocturnal dyspnea No lower extremity edema   Pertinent labs: Lab Results  Component Value Date   CHOL 135 08/26/2017   HDL 30 (L) 08/26/2017   LDLCALC 68 08/26/2017   TRIG 183 (H) 08/26/2017   CHOLHDL 4.5 08/26/2017   Lab Results  Component Value Date   NA 139 07/19/2019   K 3.6 07/19/2019   CREATININE 1.3 07/19/2019   GFRNONAA 52 07/19/2019   GFRAA 72 08/26/2017   GLUCOSE 97 08/26/2017     The ASCVD Risk score (Goff DC Jr., et al., 2013) failed to calculate for the following reasons:   The 2013 ASCVD risk score is only valid for ages 66 to 47   ---------------------------------------------------------------------------------------------------  Prediabetes, Follow-up  Lab Results  Component Value Date   HGBA1C 5.8 (A) 02/18/2020   HGBA1C 6.0 (A) 08/15/2019   GLUCOSE 97 08/26/2017   GLUCOSE 86 01/28/2016   GLUCOSE 108  (H) 12/12/2014    Last seen for for this 6 months ago.  Management since that visit includes continue healthy diet.. Current symptoms include none and have been stable.  Prior visit with dietician: no Current diet: in general, a "healthy" diet   Current exercise: none  Pertinent Labs:    Component Value Date/Time   CHOL 135 08/26/2017 1144   TRIG 183 (H) 08/26/2017 1144   CHOLHDL 4.5 08/26/2017 1144   CREATININE 1.3 07/19/2019 0000   CREATININE 1.12 08/26/2017 1144    Wt Readings from Last 3 Encounters:  02/18/20 166 lb (75.3 kg)  01/09/20 164 lb (74.4 kg)  11/06/19 167 lb (75.8 kg)    -----------------------------------------------------------------------------------------  Follow up for chronic pain/ osteoarthritis of both knees:  The patient was last seen for this 6 months ago. Changes made at last visit include none; refilled medications.  He reports good compliance with treatment. He feels that condition is Unchanged. He is not having side effects.   -----------------------------------------------------------------------------------------      Medications: Outpatient Medications Prior to Visit  Medication Sig  . acetaminophen (TYLENOL) 500 MG tablet Take 2 tablets (1,000 mg total) by mouth every 6 (six) hours as needed for mild pain or moderate pain.  Marland Kitchen amLODipine (NORVASC) 2.5 MG tablet TAKE 1 TABLET(2.5 MG) BY MOUTH DAILY  . Ascorbic Acid (VITAMIN C) 1000 MG tablet Take 2 tablets by mouth daily.  Marland Kitchen aspirin  81 MG tablet Take 2 tablets daily (Patient taking differently: 81 mg 2 (two) times daily. )  . budesonide-formoterol (SYMBICORT) 160-4.5 MCG/ACT inhaler Inhale 2 puffs into the lungs 2 (two) times daily.  . diclofenac Sodium (VOLTAREN) 1 % GEL Apply 4 g topically 4 (four) times daily as needed.  . docusate sodium (COLACE) 100 MG capsule Take 1 capsule by mouth daily as needed.  . hydrochlorothiazide (HYDRODIURIL) 25 MG tablet TAKE 1 TABLET(25 MG) BY MOUTH  DAILY  . hydroxyurea (HYDREA) 500 MG capsule Take 500 mg by mouth as directed. 2 tablets 3 days a week, and 1 tablet all other days  . Multiple Vitamins-Minerals (MULTIVITAMIN ADULT PO) Take 1 tablet by mouth daily.  . naproxen (NAPROSYN) 500 MG tablet TAKE 1 TABLET(500 MG) BY MOUTH TWICE DAILY WITH A MEAL  . oxyCODONE-acetaminophen (PERCOCET) 10-325 MG tablet Take 1 tablet by mouth every 6 (six) hours as needed for pain.  . sildenafil (VIAGRA) 50 MG tablet Take 0.5-1 tablets by mouth daily as needed.  Marland Kitchen UNABLE TO FIND Med Name: tumeric 500mg  twice a day  . valACYclovir (VALTREX) 1000 MG tablet Take 1 tablet (1,000 mg total) by mouth 2 (two) times daily as needed.   No facility-administered medications prior to visit.    Review of Systems  Constitutional: Negative for appetite change, chills and fever.  Respiratory: Negative for chest tightness, shortness of breath and wheezing.   Cardiovascular: Negative for chest pain and palpitations.  Gastrointestinal: Negative for abdominal pain, nausea and vomiting.  Musculoskeletal: Positive for arthralgias.      Objective    BP (!) 131/56 (BP Location: Right Arm, Patient Position: Sitting, Cuff Size: Normal)   Pulse 87   Temp 98.3 F (36.8 C) (Oral)   Resp 16   Wt 166 lb (75.3 kg)   BMI 23.82 kg/m    Physical Exam   General: Appearance:    Well developed, well nourished male in no acute distress  Eyes:    PERRL, conjunctiva/corneas clear, EOM's intact       Lungs:     Clear to auscultation bilaterally, respirations unlabored  Heart:    Normal heart rate. Normal rhythm. No murmurs, rubs, or gallops.   MS:   All extremities are intact.   Neurologic:   Awake, alert, oriented x 3. No apparent focal neurological           defect.        Results for orders placed or performed in visit on 02/18/20  POCT HgB A1C  Result Value Ref Range   Hemoglobin A1C 5.8 (A) 4.0 - 5.6 %   Est. average glucose Bld gHb Est-mCnc 120     Assessment &  Plan     1. Pre-diabetes Well controlled.  Continue current medications.    2. Primary hypertension Well controlled.  Continue current medications.    3. Radiculopathy, unspecified spinal region Doing well with current medication regiment which he wishes to continue unchanged.   4. Mass of urinary bladder determined by ultrasound He is scheduled for tumor excision at [facility] later this week.    Follow up A1c in 4-6 months.      The entirety of the information documented in the History of Present Illness, Review of Systems and Physical Exam were personally obtained by me. Portions of this information were initially documented by the CMA and reviewed by me for thoroughness and accuracy.      Lelon Huh, MD  [facility] [phone] (phone) [phone] (  fax)  [place]

## 2020-02-18 ENCOUNTER — Ambulatory Visit (INDEPENDENT_AMBULATORY_CARE_PROVIDER_SITE_OTHER): Payer: Medicare HMO | Admitting: Family Medicine

## 2020-02-18 ENCOUNTER — Other Ambulatory Visit: Payer: Self-pay

## 2020-02-18 ENCOUNTER — Encounter: Payer: Self-pay | Admitting: Family Medicine

## 2020-02-18 VITALS — BP 131/56 | HR 87 | Temp 98.3°F | Resp 16 | Wt 166.0 lb

## 2020-02-18 DIAGNOSIS — N3289 Other specified disorders of bladder: Secondary | ICD-10-CM | POA: Diagnosis not present

## 2020-02-18 DIAGNOSIS — I10 Essential (primary) hypertension: Secondary | ICD-10-CM | POA: Diagnosis not present

## 2020-02-18 DIAGNOSIS — R7303 Prediabetes: Secondary | ICD-10-CM

## 2020-02-18 DIAGNOSIS — M5410 Radiculopathy, site unspecified: Secondary | ICD-10-CM | POA: Diagnosis not present

## 2020-02-18 LAB — POCT GLYCOSYLATED HEMOGLOBIN (HGB A1C)
Est. average glucose Bld gHb Est-mCnc: 120
Hemoglobin A1C: 5.8 % — AB (ref 4.0–5.6)

## 2020-02-21 DIAGNOSIS — I10 Essential (primary) hypertension: Secondary | ICD-10-CM | POA: Diagnosis not present

## 2020-02-21 DIAGNOSIS — N323 Diverticulum of bladder: Secondary | ICD-10-CM | POA: Diagnosis not present

## 2020-02-21 DIAGNOSIS — D45 Polycythemia vera: Secondary | ICD-10-CM | POA: Diagnosis not present

## 2020-02-21 DIAGNOSIS — C678 Malignant neoplasm of overlapping sites of bladder: Secondary | ICD-10-CM | POA: Diagnosis not present

## 2020-02-21 DIAGNOSIS — N131 Hydronephrosis with ureteral stricture, not elsewhere classified: Secondary | ICD-10-CM | POA: Diagnosis not present

## 2020-02-21 DIAGNOSIS — N400 Enlarged prostate without lower urinary tract symptoms: Secondary | ICD-10-CM | POA: Diagnosis not present

## 2020-02-21 DIAGNOSIS — M170 Bilateral primary osteoarthritis of knee: Secondary | ICD-10-CM | POA: Diagnosis not present

## 2020-02-21 DIAGNOSIS — M19041 Primary osteoarthritis, right hand: Secondary | ICD-10-CM | POA: Diagnosis not present

## 2020-02-21 DIAGNOSIS — C673 Malignant neoplasm of anterior wall of bladder: Secondary | ICD-10-CM | POA: Diagnosis not present

## 2020-02-21 DIAGNOSIS — C672 Malignant neoplasm of lateral wall of bladder: Secondary | ICD-10-CM | POA: Diagnosis not present

## 2020-02-21 DIAGNOSIS — C679 Malignant neoplasm of bladder, unspecified: Secondary | ICD-10-CM | POA: Diagnosis not present

## 2020-02-27 ENCOUNTER — Other Ambulatory Visit: Payer: Self-pay | Admitting: Family Medicine

## 2020-02-27 DIAGNOSIS — M170 Bilateral primary osteoarthritis of knee: Secondary | ICD-10-CM

## 2020-02-27 NOTE — Telephone Encounter (Signed)
Pt called in to request a refill for oxyCODONE-acetaminophen (PERCOCET) 10-325 MG tablet     [facility] [place], [place] [address] AT [place] Phone:  [phone]  Fax:  [phone]

## 2020-02-27 NOTE — Telephone Encounter (Signed)
Requested medication (s) are due for refill today: yes   Requested medication (s) are on the active medication list: yes   Last refill:  01/31/2020  Future visit scheduled: yes   Notes to clinic:  this refill cannot be delegated    Requested Prescriptions  Pending Prescriptions Disp Refills   oxyCODONE-acetaminophen (PERCOCET) 10-325 MG tablet 120 tablet 0    Sig: Take 1 tablet by mouth every 6 (six) hours as needed for pain.      Not Delegated - Analgesics:  Opioid Agonist Combinations Failed - 02/27/2020  1:38 PM      Failed - This refill cannot be delegated      Failed - Urine Drug Screen completed in last 360 days      Passed - Valid encounter within last 6 months    Recent Outpatient Visits           1 week ago Pre-diabetes   [facility] Birdie Sons, MD   1 month ago [place], Donald E, MD   3 months ago Acute pain of left knee   [facility] Birdie Sons, MD   6 months ago Essential hypertension   [facility] Birdie Sons, MD   7 months ago Genital herpes simplex, unspecified site   [place], [name], MD       Future Appointments             In 5 months [place], [name], MD [facility], [place]

## 2020-02-29 ENCOUNTER — Other Ambulatory Visit: Payer: Self-pay | Admitting: Family Medicine

## 2020-02-29 DIAGNOSIS — I10 Essential (primary) hypertension: Secondary | ICD-10-CM

## 2020-02-29 MED ORDER — OXYCODONE-ACETAMINOPHEN 10-325 MG PO TABS: 1.0000 | ORAL_TABLET | Freq: Four times a day (QID) | ORAL | 0 refills | Status: DC | PRN

## 2020-03-05 DIAGNOSIS — Z5181 Encounter for therapeutic drug level monitoring: Secondary | ICD-10-CM | POA: Diagnosis not present

## 2020-03-05 DIAGNOSIS — H9190 Unspecified hearing loss, unspecified ear: Secondary | ICD-10-CM | POA: Diagnosis not present

## 2020-03-05 DIAGNOSIS — I7789 Other specified disorders of arteries and arterioles: Secondary | ICD-10-CM | POA: Diagnosis not present

## 2020-03-05 DIAGNOSIS — Z87891 Personal history of nicotine dependence: Secondary | ICD-10-CM | POA: Diagnosis not present

## 2020-03-05 DIAGNOSIS — C678 Malignant neoplasm of overlapping sites of bladder: Secondary | ICD-10-CM | POA: Diagnosis not present

## 2020-03-13 DIAGNOSIS — N134 Hydroureter: Secondary | ICD-10-CM | POA: Diagnosis not present

## 2020-03-13 DIAGNOSIS — C678 Malignant neoplasm of overlapping sites of bladder: Secondary | ICD-10-CM | POA: Diagnosis not present

## 2020-03-20 DIAGNOSIS — C678 Malignant neoplasm of overlapping sites of bladder: Secondary | ICD-10-CM | POA: Diagnosis not present

## 2020-03-20 DIAGNOSIS — I350 Nonrheumatic aortic (valve) stenosis: Secondary | ICD-10-CM | POA: Diagnosis not present

## 2020-03-20 DIAGNOSIS — I517 Cardiomegaly: Secondary | ICD-10-CM | POA: Diagnosis not present

## 2020-03-26 ENCOUNTER — Other Ambulatory Visit: Payer: Self-pay | Admitting: Family Medicine

## 2020-03-26 ENCOUNTER — Telehealth: Payer: Self-pay

## 2020-03-26 DIAGNOSIS — Z79899 Other long term (current) drug therapy: Secondary | ICD-10-CM | POA: Diagnosis not present

## 2020-03-26 DIAGNOSIS — D45 Polycythemia vera: Secondary | ICD-10-CM | POA: Diagnosis not present

## 2020-03-26 DIAGNOSIS — C678 Malignant neoplasm of overlapping sites of bladder: Secondary | ICD-10-CM | POA: Diagnosis not present

## 2020-03-26 DIAGNOSIS — Z87891 Personal history of nicotine dependence: Secondary | ICD-10-CM | POA: Diagnosis not present

## 2020-03-26 DIAGNOSIS — M170 Bilateral primary osteoarthritis of knee: Secondary | ICD-10-CM

## 2020-03-26 DIAGNOSIS — Z906 Acquired absence of other parts of urinary tract: Secondary | ICD-10-CM | POA: Diagnosis not present

## 2020-03-26 NOTE — Telephone Encounter (Signed)
Sorry, I really don't anything about the differences between those drugs. He would need to ask the hematologist.

## 2020-03-26 NOTE — Telephone Encounter (Signed)
Tried calling patient. An automated message answered saying that this number has a voice mailbox that has not been set up yet. Unable to leave message.

## 2020-03-26 NOTE — Telephone Encounter (Signed)
Copied from [place] #[id number]. Topic: General - Other >> Mar 26, 2020  8:44 AM Keene Breath wrote: Reason for CRM: Patient called to ask the doctor's opinion for two cancer treatments that were suggested by his cancer doctor.  They are Cisplatin and Carboplatin.  He would like to know which Dr. Caryn Section would recommend.  Please call patient to discuss at [phone]

## 2020-03-26 NOTE — Telephone Encounter (Signed)
Requested medication (s) are due for refill today:   Provider to determine  Requested medication (s) are on the active medication list:   Yes  Future visit scheduled:   Yes   Last ordered: 02/29/2020 #120, 0 refills  Non delegated refill   Requested Prescriptions  Pending Prescriptions Disp Refills   oxyCODONE-acetaminophen (PERCOCET) 10-325 MG tablet 120 tablet 0    Sig: Take 1 tablet by mouth every 6 (six) hours as needed for pain.      Not Delegated - Analgesics:  Opioid Agonist Combinations Failed - 03/26/2020  8:45 AM      Failed - This refill cannot be delegated      Failed - Urine Drug Screen completed in last 360 days      Passed - Valid encounter within last 6 months    Recent Outpatient Visits           1 month ago Pre-diabetes   [facility] Birdie Sons, MD   2 months ago [place], Donald E, MD   4 months ago Acute pain of left knee   [facility] Birdie Sons, MD   7 months ago Essential hypertension   [facility] Birdie Sons, MD   8 months ago Genital herpes simplex, unspecified site   [place], [name], MD       Future Appointments             In 4 months Fisher, [name], MD [facility], [place]

## 2020-03-26 NOTE — Telephone Encounter (Signed)
Medication Refill - Medication: oxyCODONE-acetaminophen (PERCOCET) 10-325 MG tablet     Preferred Pharmacy (with phone number or street name):  [id number] - [place], [place] [address] AT [place] Phone:  [phone]  Fax:  [phone]       Agent: Please be advised that RX refills may take up to 3 business days. We ask that you follow-up with your pharmacy.

## 2020-03-26 NOTE — Telephone Encounter (Signed)
Please review. Thanks!  

## 2020-03-28 MED ORDER — OXYCODONE-ACETAMINOPHEN 10-325 MG PO TABS: 1.0000 | ORAL_TABLET | Freq: Four times a day (QID) | ORAL | 0 refills | Status: DC | PRN

## 2020-04-02 DIAGNOSIS — D45 Polycythemia vera: Secondary | ICD-10-CM | POA: Diagnosis not present

## 2020-04-02 DIAGNOSIS — C678 Malignant neoplasm of overlapping sites of bladder: Secondary | ICD-10-CM | POA: Diagnosis not present

## 2020-04-02 DIAGNOSIS — D72820 Lymphocytosis (symptomatic): Secondary | ICD-10-CM | POA: Diagnosis not present

## 2020-04-02 DIAGNOSIS — D751 Secondary polycythemia: Secondary | ICD-10-CM | POA: Diagnosis not present

## 2020-04-09 DIAGNOSIS — C678 Malignant neoplasm of overlapping sites of bladder: Secondary | ICD-10-CM | POA: Diagnosis not present

## 2020-04-10 DIAGNOSIS — R42 Dizziness and giddiness: Secondary | ICD-10-CM | POA: Diagnosis not present

## 2020-04-10 DIAGNOSIS — R5383 Other fatigue: Secondary | ICD-10-CM | POA: Diagnosis not present

## 2020-04-10 DIAGNOSIS — Z906 Acquired absence of other parts of urinary tract: Secondary | ICD-10-CM | POA: Diagnosis not present

## 2020-04-10 DIAGNOSIS — Z87891 Personal history of nicotine dependence: Secondary | ICD-10-CM | POA: Diagnosis not present

## 2020-04-10 DIAGNOSIS — C678 Malignant neoplasm of overlapping sites of bladder: Secondary | ICD-10-CM | POA: Diagnosis not present

## 2020-04-10 DIAGNOSIS — I10 Essential (primary) hypertension: Secondary | ICD-10-CM | POA: Diagnosis not present

## 2020-04-10 DIAGNOSIS — R200 Anesthesia of skin: Secondary | ICD-10-CM | POA: Diagnosis not present

## 2020-04-10 DIAGNOSIS — D45 Polycythemia vera: Secondary | ICD-10-CM | POA: Diagnosis not present

## 2020-04-10 DIAGNOSIS — R519 Headache, unspecified: Secondary | ICD-10-CM | POA: Diagnosis not present

## 2020-04-14 DIAGNOSIS — C678 Malignant neoplasm of overlapping sites of bladder: Secondary | ICD-10-CM | POA: Diagnosis not present

## 2020-04-14 DIAGNOSIS — Z5111 Encounter for antineoplastic chemotherapy: Secondary | ICD-10-CM | POA: Diagnosis not present

## 2020-04-14 DIAGNOSIS — H903 Sensorineural hearing loss, bilateral: Secondary | ICD-10-CM | POA: Diagnosis not present

## 2020-04-14 DIAGNOSIS — T451X5A Adverse effect of antineoplastic and immunosuppressive drugs, initial encounter: Secondary | ICD-10-CM | POA: Diagnosis not present

## 2020-04-23 DIAGNOSIS — D45 Polycythemia vera: Secondary | ICD-10-CM | POA: Diagnosis not present

## 2020-04-23 DIAGNOSIS — E871 Hypo-osmolality and hyponatremia: Secondary | ICD-10-CM | POA: Diagnosis not present

## 2020-04-23 DIAGNOSIS — R799 Abnormal finding of blood chemistry, unspecified: Secondary | ICD-10-CM | POA: Diagnosis not present

## 2020-04-23 DIAGNOSIS — R630 Anorexia: Secondary | ICD-10-CM | POA: Diagnosis not present

## 2020-04-23 DIAGNOSIS — Z79899 Other long term (current) drug therapy: Secondary | ICD-10-CM | POA: Diagnosis not present

## 2020-04-23 DIAGNOSIS — T451X5A Adverse effect of antineoplastic and immunosuppressive drugs, initial encounter: Secondary | ICD-10-CM | POA: Diagnosis not present

## 2020-04-23 DIAGNOSIS — Z5111 Encounter for antineoplastic chemotherapy: Secondary | ICD-10-CM | POA: Diagnosis not present

## 2020-04-23 DIAGNOSIS — R112 Nausea with vomiting, unspecified: Secondary | ICD-10-CM | POA: Diagnosis not present

## 2020-04-23 DIAGNOSIS — R634 Abnormal weight loss: Secondary | ICD-10-CM | POA: Diagnosis not present

## 2020-04-23 DIAGNOSIS — C678 Malignant neoplasm of overlapping sites of bladder: Secondary | ICD-10-CM | POA: Diagnosis not present

## 2020-04-23 DIAGNOSIS — G620 Drug-induced polyneuropathy: Secondary | ICD-10-CM | POA: Diagnosis not present

## 2020-04-23 DIAGNOSIS — Z6823 Body mass index (BMI) 23.0-23.9, adult: Secondary | ICD-10-CM | POA: Diagnosis not present

## 2020-04-25 ENCOUNTER — Other Ambulatory Visit: Payer: Self-pay | Admitting: Family Medicine

## 2020-04-25 DIAGNOSIS — M170 Bilateral primary osteoarthritis of knee: Secondary | ICD-10-CM

## 2020-04-25 MED ORDER — OXYCODONE-ACETAMINOPHEN 10-325 MG PO TABS: 1.0000 | ORAL_TABLET | Freq: Four times a day (QID) | ORAL | 0 refills | Status: DC | PRN

## 2020-04-25 NOTE — Telephone Encounter (Signed)
Medication: oxyCODONE-acetaminophen (PERCOCET) 10-325 MG tablet [[id number]]   Has the patient contacted their pharmacy? YES (Agent: If no, request that the patient contact the pharmacy for the refill.) (Agent: If yes, when and what did the pharmacy advise?)  Preferred Pharmacy (with phone number or street name): [facility] [place], [place] - [address] AT [address] [place] [address] Phone: [phone] Fax: [phone] Hours: Not open 24 hours    Agent: Please be advised that RX refills may take up to 3 business days. We ask that you follow-up with your pharmacy.

## 2020-04-25 NOTE — Telephone Encounter (Signed)
Requested medication (s) are due for refill today: Provider to determine  Requested medication (s) are on the active medication list:   Yes  Future visit scheduled:   Yes   Last ordered: Non delegated refill request.   Requested Prescriptions  Pending Prescriptions Disp Refills   oxyCODONE-acetaminophen (PERCOCET) 10-325 MG tablet 120 tablet 0    Sig: Take 1 tablet by mouth every 6 (six) hours as needed for pain.      Not Delegated - Analgesics:  Opioid Agonist Combinations Failed - 04/25/2020 12:07 PM      Failed - This refill cannot be delegated      Failed - Urine Drug Screen completed in last 360 days      Passed - Valid encounter within last 6 months    Recent Outpatient Visits           2 months ago Pre-diabetes   [facility] Birdie Sons, MD   3 months ago [place], Donald E, MD   5 months ago Acute pain of left knee   [facility] Birdie Sons, MD   8 months ago Essential hypertension   [facility] Birdie Sons, MD   9 months ago Genital herpes simplex, unspecified site   [place], [name], MD       Future Appointments             In 3 months Fisher, [name], MD [facility], [place]

## 2020-05-01 DIAGNOSIS — Z5111 Encounter for antineoplastic chemotherapy: Secondary | ICD-10-CM | POA: Diagnosis not present

## 2020-05-01 DIAGNOSIS — G620 Drug-induced polyneuropathy: Secondary | ICD-10-CM | POA: Diagnosis not present

## 2020-05-01 DIAGNOSIS — R112 Nausea with vomiting, unspecified: Secondary | ICD-10-CM | POA: Diagnosis not present

## 2020-05-01 DIAGNOSIS — D45 Polycythemia vera: Secondary | ICD-10-CM | POA: Diagnosis not present

## 2020-05-01 DIAGNOSIS — T451X5A Adverse effect of antineoplastic and immunosuppressive drugs, initial encounter: Secondary | ICD-10-CM | POA: Diagnosis not present

## 2020-05-01 DIAGNOSIS — R944 Abnormal results of kidney function studies: Secondary | ICD-10-CM | POA: Diagnosis not present

## 2020-05-01 DIAGNOSIS — E871 Hypo-osmolality and hyponatremia: Secondary | ICD-10-CM | POA: Diagnosis not present

## 2020-05-01 DIAGNOSIS — C678 Malignant neoplasm of overlapping sites of bladder: Secondary | ICD-10-CM | POA: Diagnosis not present

## 2020-05-02 DIAGNOSIS — H903 Sensorineural hearing loss, bilateral: Secondary | ICD-10-CM | POA: Diagnosis not present

## 2020-05-07 DIAGNOSIS — R630 Anorexia: Secondary | ICD-10-CM | POA: Diagnosis not present

## 2020-05-07 DIAGNOSIS — C678 Malignant neoplasm of overlapping sites of bladder: Secondary | ICD-10-CM | POA: Diagnosis not present

## 2020-05-07 DIAGNOSIS — E860 Dehydration: Secondary | ICD-10-CM | POA: Diagnosis not present

## 2020-05-07 DIAGNOSIS — R634 Abnormal weight loss: Secondary | ICD-10-CM | POA: Diagnosis not present

## 2020-05-07 DIAGNOSIS — G620 Drug-induced polyneuropathy: Secondary | ICD-10-CM | POA: Diagnosis not present

## 2020-05-07 DIAGNOSIS — T451X5A Adverse effect of antineoplastic and immunosuppressive drugs, initial encounter: Secondary | ICD-10-CM | POA: Diagnosis not present

## 2020-05-07 DIAGNOSIS — I517 Cardiomegaly: Secondary | ICD-10-CM | POA: Diagnosis not present

## 2020-05-07 DIAGNOSIS — Z79899 Other long term (current) drug therapy: Secondary | ICD-10-CM | POA: Diagnosis not present

## 2020-05-07 DIAGNOSIS — B3781 Candidal esophagitis: Secondary | ICD-10-CM | POA: Diagnosis not present

## 2020-05-07 DIAGNOSIS — R5381 Other malaise: Secondary | ICD-10-CM | POA: Diagnosis not present

## 2020-05-07 DIAGNOSIS — D630 Anemia in neoplastic disease: Secondary | ICD-10-CM | POA: Diagnosis not present

## 2020-05-07 DIAGNOSIS — R112 Nausea with vomiting, unspecified: Secondary | ICD-10-CM | POA: Diagnosis not present

## 2020-05-14 DIAGNOSIS — R310 Gross hematuria: Secondary | ICD-10-CM | POA: Diagnosis not present

## 2020-05-14 DIAGNOSIS — D45 Polycythemia vera: Secondary | ICD-10-CM | POA: Diagnosis not present

## 2020-05-14 DIAGNOSIS — C678 Malignant neoplasm of overlapping sites of bladder: Secondary | ICD-10-CM | POA: Diagnosis not present

## 2020-05-14 DIAGNOSIS — Z87891 Personal history of nicotine dependence: Secondary | ICD-10-CM | POA: Diagnosis not present

## 2020-05-14 DIAGNOSIS — D696 Thrombocytopenia, unspecified: Secondary | ICD-10-CM | POA: Diagnosis not present

## 2020-05-14 DIAGNOSIS — Z1589 Genetic susceptibility to other disease: Secondary | ICD-10-CM | POA: Diagnosis not present

## 2020-05-14 DIAGNOSIS — Z906 Acquired absence of other parts of urinary tract: Secondary | ICD-10-CM | POA: Diagnosis not present

## 2020-05-14 DIAGNOSIS — Z79899 Other long term (current) drug therapy: Secondary | ICD-10-CM | POA: Diagnosis not present

## 2020-05-19 DIAGNOSIS — D45 Polycythemia vera: Secondary | ICD-10-CM | POA: Diagnosis not present

## 2020-05-19 DIAGNOSIS — R630 Anorexia: Secondary | ICD-10-CM | POA: Diagnosis not present

## 2020-05-19 DIAGNOSIS — K5900 Constipation, unspecified: Secondary | ICD-10-CM | POA: Diagnosis not present

## 2020-05-19 DIAGNOSIS — C678 Malignant neoplasm of overlapping sites of bladder: Secondary | ICD-10-CM | POA: Diagnosis not present

## 2020-05-19 DIAGNOSIS — I10 Essential (primary) hypertension: Secondary | ICD-10-CM | POA: Diagnosis not present

## 2020-05-19 DIAGNOSIS — D696 Thrombocytopenia, unspecified: Secondary | ICD-10-CM | POA: Diagnosis not present

## 2020-05-19 DIAGNOSIS — R5383 Other fatigue: Secondary | ICD-10-CM | POA: Diagnosis not present

## 2020-05-19 DIAGNOSIS — Z5111 Encounter for antineoplastic chemotherapy: Secondary | ICD-10-CM | POA: Diagnosis not present

## 2020-05-19 DIAGNOSIS — K921 Melena: Secondary | ICD-10-CM | POA: Diagnosis not present

## 2020-05-19 DIAGNOSIS — Z1589 Genetic susceptibility to other disease: Secondary | ICD-10-CM | POA: Diagnosis not present

## 2020-05-20 ENCOUNTER — Other Ambulatory Visit: Payer: Self-pay | Admitting: Family Medicine

## 2020-05-20 ENCOUNTER — Telehealth: Payer: Self-pay

## 2020-05-20 DIAGNOSIS — M170 Bilateral primary osteoarthritis of knee: Secondary | ICD-10-CM

## 2020-05-20 MED ORDER — OXYCODONE-ACETAMINOPHEN 10-325 MG PO TABS: 1.0000 | ORAL_TABLET | Freq: Four times a day (QID) | ORAL | 0 refills | Status: DC | PRN

## 2020-05-20 NOTE — Telephone Encounter (Signed)
Requested medication (s) are due for refill today -no  Requested medication (s) are on the active medication list -yes  Future visit scheduled -yes  Last refill: 3 weeks- 04/25/20  Notes to clinic: Request RF non delegated Rx: (patient has an outpatient surgery scheduled for Monday 05/26/20 and would like to pick up prescription before then)  Requested Prescriptions  Pending Prescriptions Disp Refills   oxyCODONE-acetaminophen (PERCOCET) 10-325 MG tablet 120 tablet 0    Sig: Take 1 tablet by mouth every 6 (six) hours as needed for pain.      Not Delegated - Analgesics:  Opioid Agonist Combinations Failed - 05/20/2020 11:59 AM      Failed - This refill cannot be delegated      Failed - Urine Drug Screen completed in last 360 days      Passed - Valid encounter within last 6 months    Recent Outpatient Visits           3 months ago [place], Donald E, MD   4 months ago [place], Donald E, MD   6 months ago Acute pain of left knee   [facility] Birdie Sons, MD   9 months ago Essential hypertension   [facility] Birdie Sons, MD   10 months ago Genital herpes simplex, unspecified site   [facility] Birdie Sons, MD       Future Appointments             In 3 months Fisher, [name], MD [facility], [facility]                 Requested Prescriptions  Pending Prescriptions Disp Refills   oxyCODONE-acetaminophen (PERCOCET) 10-325 MG tablet 120 tablet 0    Sig: Take 1 tablet by mouth every 6 (six) hours as needed for pain.      Not Delegated - Analgesics:  Opioid Agonist Combinations Failed - 05/20/2020 11:59 AM      Failed - This refill cannot be delegated      Failed - Urine Drug Screen completed in last 360 days      Passed - Valid encounter within last 6 months    Recent Outpatient Visits           3 months ago Pre-diabetes    [facility] Birdie Sons, MD   4 months ago [place], Donald E, MD   6 months ago Acute pain of left knee   [facility] Birdie Sons, MD   9 months ago Essential hypertension   [facility] Birdie Sons, MD   10 months ago Genital herpes simplex, unspecified site   [place], [name], MD       Future Appointments             In 3 months Fisher, [name], MD [facility], [place]

## 2020-05-20 NOTE — Telephone Encounter (Signed)
Copied from [place] [phone]. Topic: Quick Communication - Rx Refill/Question >> May 20, 2020 11:47 AM Tessa Lerner A wrote: Medication: oxyCODONE-acetaminophen (PERCOCET) 10-325 MG  (patient has an outpatient surgery scheduled for Monday 05/26/20 and would like to pick up prescription before then)  Has the patient contacted their pharmacy? No. Patient has not contacted pharmacy.   Preferred Pharmacy (with phone number or street name): [facility] #[id number] - [place], [place] [address] AT [place]  Phone:  [phone]  Agent: Please be advised that RX refills may take up to 3 business days. We ask that you follow-up with your pharmacy.

## 2020-05-20 NOTE — Telephone Encounter (Signed)
Copied from [place] [phone]. Topic: General - Other >> May 20, 2020 11:50 AM Jerry Mcpherson A wrote: Reason for CRM: Patient made contact with PCP to notify of a "potentially upcoming" surgery Patient may be having kidney bladder removed at [facility], upon further testing

## 2020-05-21 DIAGNOSIS — R682 Dry mouth, unspecified: Secondary | ICD-10-CM | POA: Diagnosis not present

## 2020-05-21 DIAGNOSIS — C678 Malignant neoplasm of overlapping sites of bladder: Secondary | ICD-10-CM | POA: Diagnosis not present

## 2020-05-21 DIAGNOSIS — N201 Calculus of ureter: Secondary | ICD-10-CM | POA: Diagnosis not present

## 2020-05-21 DIAGNOSIS — Z08 Encounter for follow-up examination after completed treatment for malignant neoplasm: Secondary | ICD-10-CM | POA: Diagnosis not present

## 2020-05-21 DIAGNOSIS — I2510 Atherosclerotic heart disease of native coronary artery without angina pectoris: Secondary | ICD-10-CM | POA: Diagnosis not present

## 2020-05-21 DIAGNOSIS — N179 Acute kidney failure, unspecified: Secondary | ICD-10-CM | POA: Diagnosis not present

## 2020-05-21 DIAGNOSIS — D696 Thrombocytopenia, unspecified: Secondary | ICD-10-CM | POA: Diagnosis not present

## 2020-05-21 DIAGNOSIS — C801 Malignant (primary) neoplasm, unspecified: Secondary | ICD-10-CM | POA: Diagnosis not present

## 2020-05-21 DIAGNOSIS — H9190 Unspecified hearing loss, unspecified ear: Secondary | ICD-10-CM | POA: Diagnosis not present

## 2020-05-21 DIAGNOSIS — R310 Gross hematuria: Secondary | ICD-10-CM | POA: Diagnosis not present

## 2020-05-21 DIAGNOSIS — R638 Other symptoms and signs concerning food and fluid intake: Secondary | ICD-10-CM | POA: Diagnosis not present

## 2020-05-21 DIAGNOSIS — N289 Disorder of kidney and ureter, unspecified: Secondary | ICD-10-CM | POA: Diagnosis not present

## 2020-05-22 DIAGNOSIS — N179 Acute kidney failure, unspecified: Secondary | ICD-10-CM | POA: Diagnosis not present

## 2020-05-22 DIAGNOSIS — C678 Malignant neoplasm of overlapping sites of bladder: Secondary | ICD-10-CM | POA: Diagnosis not present

## 2020-05-22 DIAGNOSIS — I517 Cardiomegaly: Secondary | ICD-10-CM | POA: Diagnosis not present

## 2020-05-22 DIAGNOSIS — I350 Nonrheumatic aortic (valve) stenosis: Secondary | ICD-10-CM | POA: Diagnosis not present

## 2020-05-22 DIAGNOSIS — Z79899 Other long term (current) drug therapy: Secondary | ICD-10-CM | POA: Diagnosis not present

## 2020-05-23 ENCOUNTER — Telehealth: Payer: Self-pay | Admitting: *Deleted

## 2020-05-23 NOTE — Telephone Encounter (Signed)
Copied from [place] [id number]. Topic: General - Other >> May 23, 2020 11:34 AM Tessa Lerner A wrote: Reason for CRM: Patient called to notify PCP that chemo was effective in treating cancer, per Dr. Vertell Limber at [facility] Patient expects to have kidney bladder removed in March, no specific day for surgery has been scheduled at this time

## 2020-05-26 DIAGNOSIS — K1231 Oral mucositis (ulcerative) due to antineoplastic therapy: Secondary | ICD-10-CM | POA: Diagnosis not present

## 2020-05-26 DIAGNOSIS — D638 Anemia in other chronic diseases classified elsewhere: Secondary | ICD-10-CM | POA: Diagnosis not present

## 2020-05-26 DIAGNOSIS — R638 Other symptoms and signs concerning food and fluid intake: Secondary | ICD-10-CM | POA: Diagnosis not present

## 2020-05-26 DIAGNOSIS — D45 Polycythemia vera: Secondary | ICD-10-CM | POA: Diagnosis not present

## 2020-05-26 DIAGNOSIS — I2510 Atherosclerotic heart disease of native coronary artery without angina pectoris: Secondary | ICD-10-CM | POA: Diagnosis not present

## 2020-05-26 DIAGNOSIS — C678 Malignant neoplasm of overlapping sites of bladder: Secondary | ICD-10-CM | POA: Diagnosis not present

## 2020-05-26 DIAGNOSIS — R5381 Other malaise: Secondary | ICD-10-CM | POA: Diagnosis not present

## 2020-05-26 DIAGNOSIS — R110 Nausea: Secondary | ICD-10-CM | POA: Diagnosis not present

## 2020-05-26 DIAGNOSIS — N201 Calculus of ureter: Secondary | ICD-10-CM | POA: Diagnosis not present

## 2020-05-28 DIAGNOSIS — C670 Malignant neoplasm of trigone of bladder: Secondary | ICD-10-CM | POA: Diagnosis not present

## 2020-05-28 DIAGNOSIS — D471 Chronic myeloproliferative disease: Secondary | ICD-10-CM | POA: Diagnosis not present

## 2020-05-28 DIAGNOSIS — C678 Malignant neoplasm of overlapping sites of bladder: Secondary | ICD-10-CM | POA: Diagnosis not present

## 2020-05-28 DIAGNOSIS — D630 Anemia in neoplastic disease: Secondary | ICD-10-CM | POA: Diagnosis not present

## 2020-05-28 DIAGNOSIS — R638 Other symptoms and signs concerning food and fluid intake: Secondary | ICD-10-CM | POA: Diagnosis not present

## 2020-06-04 DIAGNOSIS — D45 Polycythemia vera: Secondary | ICD-10-CM | POA: Diagnosis not present

## 2020-06-04 DIAGNOSIS — I10 Essential (primary) hypertension: Secondary | ICD-10-CM | POA: Diagnosis not present

## 2020-06-04 DIAGNOSIS — C670 Malignant neoplasm of trigone of bladder: Secondary | ICD-10-CM | POA: Diagnosis not present

## 2020-06-04 DIAGNOSIS — H532 Diplopia: Secondary | ICD-10-CM | POA: Diagnosis not present

## 2020-06-04 DIAGNOSIS — Z87891 Personal history of nicotine dependence: Secondary | ICD-10-CM | POA: Diagnosis not present

## 2020-06-04 DIAGNOSIS — Z1589 Genetic susceptibility to other disease: Secondary | ICD-10-CM | POA: Diagnosis not present

## 2020-06-10 ENCOUNTER — Other Ambulatory Visit: Payer: Self-pay | Admitting: Family Medicine

## 2020-06-10 DIAGNOSIS — I10 Essential (primary) hypertension: Secondary | ICD-10-CM

## 2020-06-16 ENCOUNTER — Telehealth: Payer: Self-pay | Admitting: Family Medicine

## 2020-06-16 DIAGNOSIS — M170 Bilateral primary osteoarthritis of knee: Secondary | ICD-10-CM

## 2020-06-16 NOTE — Telephone Encounter (Signed)
Patient advised that Dr. Caryn Section is out of the office. Patient agrees to wait for Dr. Caryn Section to respond to refill request tomorrow.

## 2020-06-16 NOTE — Telephone Encounter (Signed)
Requested medication (s) are due for refill today: yes  Requested medication (s) are on the active medication list: yes  Last refill:  05/20/20  Future visit scheduled: yes  Notes to clinic:  not delegated    Requested Prescriptions  Pending Prescriptions Disp Refills   oxyCODONE-acetaminophen (PERCOCET) 10-325 MG tablet 120 tablet 0    Sig: Take 1 tablet by mouth every 6 (six) hours as needed for pain.      Not Delegated - Analgesics:  Opioid Agonist Combinations Failed - 06/16/2020  8:39 AM      Failed - This refill cannot be delegated      Failed - Urine Drug Screen completed in last 360 days      Passed - Valid encounter within last 6 months    Recent Outpatient Visits           3 months ago Pre-diabetes   [facility] Birdie Sons, MD   5 months ago [place], Donald E, MD   7 months ago Acute pain of left knee   [facility] Birdie Sons, MD   10 months ago Essential hypertension   [facility] Birdie Sons, MD   10 months ago Genital herpes simplex, unspecified site   [place], [name], MD       Future Appointments             In 2 months Fisher, [name], MD [facility], [place]

## 2020-06-16 NOTE — Telephone Encounter (Signed)
Medication Refill - Medication:oxyCODONE-acetaminophen (PERCOCET) 10-325 MG tablet. Patient states he would like medication sent in today because he is out. Informed Patient PCP is out of the office today and to please allow 48 to 72 hour turn around time. Patient states he would like covering physician to fill.   Has the patient contacted their pharmacy? No. Patient states he has no refills   Preferred Pharmacy (with phone number or street name):   [facility]  [place], [place] [address]  [phone]   Agent: Please be advised that RX refills may take up to 3 business days. We ask that you follow-up with your pharmacy.

## 2020-06-16 NOTE — Telephone Encounter (Signed)
Pt was calling to see if there was anyway to get it refilled today. Pt states he has to go to the hospital tomorrow and Wednesday which is why he wanted it by today.

## 2020-06-16 NOTE — Telephone Encounter (Signed)
30 day supply dispensed 05-20-2020

## 2020-06-17 MED ORDER — OXYCODONE-ACETAMINOPHEN 10-325 MG PO TABS: 1.0000 | ORAL_TABLET | Freq: Four times a day (QID) | ORAL | 0 refills | Status: DC | PRN

## 2020-06-23 NOTE — Telephone Encounter (Signed)
Patient states pharmacy advised  prior authorization is needed for oxyCODONE-acetaminophen (PERCOCET) 10-325 MG tablet. Patient contacted insurance company and they advised for a nurse to call  [employer] to initiate authorization or they will not cover. Patient would like a follow up call when completed

## 2020-06-23 NOTE — Telephone Encounter (Signed)
I called number listed below and initiated PA over the phone. PA has been approved. Patient advised. Approval information will be faxed to our office.

## 2020-07-15 ENCOUNTER — Telehealth: Payer: Self-pay

## 2020-07-15 NOTE — Telephone Encounter (Signed)
Copied from [place] [phone]. Topic: General - Other >> Jul 15, 2020 10:50 AM Keene Breath wrote: Reason for CRM: Patient called to request that his AWV on 4/7 be over the phone.  He would like the nurse to call him instead of coming into the office.  CB# [phone].  Please call patient to confirm

## 2020-07-16 NOTE — Telephone Encounter (Signed)
Confirmed with patient it will be virtual

## 2020-07-16 NOTE — Progress Notes (Signed)
Subjective:   Jerry Mcpherson is a 80 y.o. male who presents for Medicare Annual/Subsequent preventive examination.  I connected with Sharene Butters today by telephone and verified that I am speaking with the correct person using two identifiers. Location patient: home Location provider: work Persons participating in the virtual visit: patient, provider.   I discussed the limitations, risks, security and privacy concerns of performing an evaluation and management service by telephone and the availability of in person appointments. I also discussed with the patient that there may be a patient responsible charge related to this service. The patient expressed understanding and verbally consented to this telephonic visit.    Interactive audio and video telecommunications were attempted between this provider and patient, however failed, due to patient having technical difficulties OR patient did not have access to video capability.  We continued and completed visit with audio only.   Review of Systems    N/A  Cardiac Risk Factors include: advanced age (>79men, >1 women)     Objective:    There were no vitals filed for this visit. There is no height or weight on file to calculate BMI.  Advanced Directives 07/17/2020 07/16/2019 08/26/2017 08/24/2016 08/24/2016  Does Patient Have a Medical Advance Directive? No No No No No  Would patient like information on creating a medical advance directive? No - Patient declined No - Patient declined No - Patient declined - -    Current Medications (verified) Outpatient Encounter Medications as of 07/17/2020  Medication Sig  . amLODipine (NORVASC) 2.5 MG tablet TAKE 1 TABLET(2.5 MG) BY MOUTH DAILY  . Ascorbic Acid (VITAMIN C) 1000 MG tablet Take 2 tablets by mouth daily.  . diclofenac Sodium (VOLTAREN) 1 % GEL Apply 4 g topically 4 (four) times daily as needed.  . docusate sodium (COLACE) 100 MG capsule Take 1 capsule by mouth daily as needed.  .  hydrochlorothiazide (HYDRODIURIL) 25 MG tablet TAKE 1 TABLET(25 MG) BY MOUTH DAILY  . oxyCODONE-acetaminophen (PERCOCET) 10-325 MG tablet Take 1 tablet by mouth every 6 (six) hours as needed for pain.  . sildenafil (VIAGRA) 50 MG tablet Take 0.5-1 tablets by mouth daily as needed.  . valACYclovir (VALTREX) 1000 MG tablet Take 1 tablet (1,000 mg total) by mouth 2 (two) times daily as needed.  Marland Kitchen acetaminophen (TYLENOL) 500 MG tablet Take 2 tablets (1,000 mg total) by mouth every 6 (six) hours as needed for mild pain or moderate pain. (Patient not taking: Reported on 07/17/2020)  . aspirin 81 MG tablet Take 2 tablets daily (Patient not taking: Reported on 07/17/2020)  . budesonide-formoterol (SYMBICORT) 160-4.5 MCG/ACT inhaler Inhale 2 puffs into the lungs 2 (two) times daily. (Patient not taking: Reported on 07/17/2020)  . hydroxyurea (HYDREA) 500 MG capsule Take 500 mg by mouth as directed. 2 tablets 3 days a week, and 1 tablet all other days (Patient not taking: Reported on 07/17/2020)  . Multiple Vitamins-Minerals (MULTIVITAMIN ADULT PO) Take 1 tablet by mouth daily. (Patient not taking: Reported on 07/17/2020)  . naproxen (NAPROSYN) 500 MG tablet TAKE 1 TABLET(500 MG) BY MOUTH TWICE DAILY WITH A MEAL (Patient not taking: Reported on 07/17/2020)  . UNABLE TO FIND Med Name: tumeric 500mg  twice a day (Patient not taking: Reported on 07/17/2020)   No facility-administered encounter medications on file as of 07/17/2020.    Allergies (verified) Codeine, Cozaar [losartan potassium], Cyclobenzaprine, Shellfish allergy, Ultram  [tramadol hcl], and Celebrex [celecoxib]   History: Past Medical History:  Diagnosis Date  . Bladder  cancer ([place])   . Genital herpes   . Hyperglycemia   . Hypertension   . Squamous cell carcinoma    Past Surgical History:  Procedure Laterality Date  . CATARACT EXTRACTION Left 10/2012   Dr. Murvin Natal; [place] [facility]  . EYE SURGERY Left    removed a piece of steel  . HERNIA  REPAIR  56/43/3295   umbilical hernia, incarderated; Dr. Bary Castilla  . ROTATOR CUFF REPAIR    . SQUAMOUS CELL CARCINOMA EXCISION  2008  . TONSILLECTOMY     Family History  Problem Relation Age of Onset  . Hypertension Mother   . Heart attack Mother   . Stroke Father   . Cancer Father   . COPD Sister   . Congestive Heart Failure Brother    Social History   Socioeconomic History  . Marital status: Divorced    Spouse name: Not on file  . Number of children: 1  . Years of education: Not on file  . Highest education level: Some college, no degree  Occupational History  . Occupation: Retired  . Occupation: mechanic/technical part time  Tobacco Use  . Smoking status: Former Smoker    Packs/day: 3.00    Years: 15.00    Pack years: 45.00    Types: Cigarettes    Quit date: 04/12/1968    Years since quitting: 52.2  . Smokeless tobacco: Never Used  Vaping Use  . Vaping Use: Never used  Substance and Sexual Activity  . Alcohol use: No    Alcohol/week: 0.0 standard drinks  . Drug use: No  . Sexual activity: Not on file  Other Topics Concern  . Not on file  Social History Narrative  . Not on file   Social Determinants of Health   Financial Resource Strain: Low Risk   . Difficulty of Paying Living Expenses: Not hard at all  Food Insecurity: No Food Insecurity  . Worried About Charity fundraiser in the Last Year: Never true  . Ran Out of Food in the Last Year: Never true  Transportation Needs: No Transportation Needs  . Lack of Transportation (Medical): No  . Lack of Transportation (Non-Medical): No  Physical Activity: Inactive  . Days of Exercise per Week: 0 days  . Minutes of Exercise per Session: 0 min  Stress: No Stress Concern Present  . Feeling of Stress : Not at all  Social Connections: Socially Isolated  . Frequency of Communication with Friends and Family: More than three times a week  . Frequency of Social Gatherings with Friends and Family: More than three times  a week  . Attends Religious Services: Never  . Active Member of Clubs or Organizations: No  . Attends Archivist Meetings: Never  . Marital Status: Divorced    Tobacco Counseling Counseling given: Not Answered   Clinical Intake:  Pre-visit preparation completed: Yes  Pain : No/denies pain     Nutritional Risks: Nausea/ vomitting/ diarrhea (Nausea occasionally due to medications.) Diabetes: No  How often do you need to have someone help you when you read instructions, pamphlets, or other written materials from your doctor or pharmacy?: 2 - Rarely  Diabetic? No  Interpreter Needed?: No  Information entered by :: [facility], LPN   Activities of Daily Living In your present state of health, do you have any difficulty performing the following activities: 07/17/2020  Hearing? Y  Comment Wears bilateral hearing aids.  Vision? Y  Comment Needs a new eye glass prescription.  Difficulty  concentrating or making decisions? N  Walking or climbing stairs? Y  Comment Due to knee pains.  Dressing or bathing? N  Doing errands, shopping? N  Preparing Food and eating ? N  Using the Toilet? N  In the past six months, have you accidently leaked urine? Y  Comment Due to bladder cancer.  Do you have problems with loss of bowel control? N  Managing your Medications? N  Managing your Finances? N  Housekeeping or managing your Housekeeping? N  Some recent data might be hidden    Patient Care Team: Birdie Sons, MD as PCP - General (Family Medicine) O'Branski, Cleophus Molt, [place] (Oncology) System, Provider Not In Ellen Henri, MD (Urology) Hoimes, Gwenyth Allegra, DO as Referring Physician (Hematology and Oncology)  Indicate any recent Medical Services you may have received from other than [facility] providers in the past year (date may be approximate).     Assessment:   This is a routine wellness examination for Taige.  Hearing/Vision screen No exam data  present  Dietary issues and exercise activities discussed: Current Exercise Habits: The patient does not participate in regular exercise at present, Exercise limited by: Other - see comments (has sx to removed a tumor from his bladder next week)  Goals    . Have 3 meals a day     Recommend eating three healthy meals a day.       Depression Screen PHQ 2/9 Scores 07/17/2020 01/09/2020 02/02/2019 08/26/2017 08/26/2017 08/24/2016 08/24/2016  PHQ - 2 Score 0 0 0 0 0 0 0  PHQ- 9 Score - 1 - 0 - 0 -    Fall Risk Fall Risk  07/17/2020 01/09/2020 07/16/2019 02/02/2019 08/26/2017  Falls in the past year? 0 1 0 0 No  Number falls in past yr: 0 0 0 0 -  Injury with Fall? 0 1 0 0 -    FALL RISK PREVENTION PERTAINING TO THE HOME:  Any stairs in or around the home? Yes  If so, are there any without handrails? No  Home free of loose throw rugs in walkways, pet beds, electrical cords, etc? Yes  Adequate lighting in your home to reduce risk of falls? Yes   ASSISTIVE DEVICES UTILIZED TO PREVENT FALLS:  Life alert? No  Use of a cane, walker or w/c? No  Grab bars in the bathroom? Yes  Shower chair or bench in shower? No  Elevated toilet seat or a handicapped toilet? No   Cognitive Function: Normal cognitive status assessed by observation by this Nurse Health Advisor. No abnormalities found.       6CIT Screen 08/26/2017 08/24/2016  What Year? 0 points 0 points  What month? 0 points 0 points  What time? 0 points 0 points  Count back from 20 0 points 0 points  Months in reverse 0 points 0 points  Repeat phrase 0 points 2 points  Total Score 0 2    Immunizations Immunization History  Administered Date(s) Administered  . Fluad Quad(high Dose 65+) 02/02/2019, 01/09/2020  . Influenza Split 01/01/2009  . Influenza, High Dose Seasonal PF 12/31/2013, 12/12/2014, 01/28/2016, 02/03/2017, 12/14/2017  . Influenza-Unspecified 05/22/2017  . Moderna Sars-Covid-2 Vaccination 05/26/2019, 06/23/2019  .  Pneumococcal Conjugate-13 12/31/2013  . Pneumococcal Polysaccharide-23 02/22/2004  . Tdap 10/28/2011  . Zoster 01/28/2009    TDAP status: Up to date  Flu Vaccine status: Up to date  Pneumococcal vaccine status: Up to date  Covid-19 vaccine status: Completed vaccines  Qualifies for Shingles Vaccine? Yes  Zostavax completed Yes   Shingrix Completed?: No.    Education has been provided regarding the importance of this vaccine. Patient has been advised to call insurance company to determine out of pocket expense if they have not yet received this vaccine. Advised may also receive vaccine at local pharmacy or Health Dept. Verbalized acceptance and understanding.  Screening Tests Health Maintenance  Topic Date Due  . COVID-19 Vaccine (3 - Moderna risk 4-dose series) 07/21/2019  . INFLUENZA VACCINE  11/10/2020  . TETANUS/TDAP  10/27/2021  . PNA vac Low Risk Adult  Completed  . HPV VACCINES  Aged Out    Health Maintenance  Health Maintenance Due  Topic Date Due  . COVID-19 Vaccine (3 - Moderna risk 4-dose series) 07/21/2019    Colorectal cancer screening: No longer required.   Lung Cancer Screening: (Low Dose CT Chest recommended if Age 79-80 years, 30 pack-year currently smoking OR have quit w/in 15years.) does not qualify.    Additional Screening:  Vision Screening: Recommended annual ophthalmology exams for early detection of glaucoma and other disorders of the eye. Is the patient up to date with their annual eye exam?  Yes  Who is the provider or what is the name of the office in which the patient attends annual eye exams? Dr Steffanie Rainwater @ [facility] If pt is not established with a provider, would they like to be referred to a provider to establish care? No .   Dental Screening: Recommended annual dental exams for proper oral hygiene  Community Resource Referral / Chronic Care Management: CRR required this visit?  No   CCM required this visit?  No      Plan:      I have personally reviewed and noted the following in the patient's chart:   . Medical and social history . Use of alcohol, tobacco or illicit drugs  . Current medications and supplements . Functional ability and status . Nutritional status . Physical activity . Advanced directives . List of other physicians . Hospitalizations, surgeries, and ER visits in previous 12 months . Vitals . Screenings to include cognitive, depression, and falls . Referrals and appointments  In addition, I have reviewed and discussed with patient certain preventive protocols, quality metrics, and best practice recommendations. A written personalized care plan for preventive services as well as general preventive health recommendations were provided to patient.     Latecia Miler [place], [place]   06/10/4968   Nurse Notes: Pt plans to receive his Covid booster.

## 2020-07-17 ENCOUNTER — Other Ambulatory Visit: Payer: Self-pay

## 2020-07-17 ENCOUNTER — Ambulatory Visit (INDEPENDENT_AMBULATORY_CARE_PROVIDER_SITE_OTHER): Payer: Medicare Other

## 2020-07-17 DIAGNOSIS — Z0000 Encounter for general adult medical examination without abnormal findings: Secondary | ICD-10-CM | POA: Diagnosis not present

## 2020-07-17 NOTE — Patient Instructions (Signed)
[name] , Thank you for taking time to come for your Medicare Wellness Visit. I appreciate your ongoing commitment to your health goals. Please review the following plan we discussed and let me know if I can assist you in the future.   Screening recommendations/referrals: Colonoscopy: No longer required.  Recommended yearly ophthalmology/optometry visit for glaucoma screening and checkup Recommended yearly dental visit for hygiene and checkup  Vaccinations: Influenza vaccine: Done 01/08/21 Pneumococcal vaccine: Completed series Tdap vaccine: Up to date, due 10/2021 Shingles vaccine: Shingrix discussed. Please contact your pharmacy for coverage information.     Advanced directives: Advance directive discussed with you today. Even though you declined this today please call our office should you change your mind and we can give you the proper paperwork for you to fill out.  Conditions/risks identified: Recommend eating three healthy meals a day.   Next appointment: 08/18/20 @ 9:40 AM with Dr Caryn Section   Preventive Care 82 Years and Older, Male Preventive care refers to lifestyle choices and visits with your health care provider that can promote health and wellness. What does preventive care include?  A yearly physical exam. This is also called an annual well check.  Dental exams once or twice a year.  Routine eye exams. Ask your health care provider how often you should have your eyes checked.  Personal lifestyle choices, including:  Daily care of your teeth and gums.  Regular physical activity.  Eating a healthy diet.  Avoiding tobacco and drug use.  Limiting alcohol use.  Practicing safe sex.  Taking low doses of aspirin every day.  Taking vitamin and mineral supplements as recommended by your health care provider. What happens during an annual well check? The services and screenings done by your health care provider during your annual well check will depend on your age,  overall health, lifestyle risk factors, and family history of disease. Counseling  Your health care provider may ask you questions about your:  Alcohol use.  Tobacco use.  Drug use.  Emotional well-being.  Home and relationship well-being.  Sexual activity.  Eating habits.  History of falls.  Memory and ability to understand (cognition).  Work and work Statistician. Screening  You may have the following tests or measurements:  Height, weight, and BMI.  Blood pressure.  Lipid and cholesterol levels. These may be checked every 5 years, or more frequently if you are over 56 years old.  Skin check.  Lung cancer screening. You may have this screening every year starting at age 22 if you have a 30-pack-year history of smoking and currently smoke or have quit within the past 15 years.  Fecal occult blood test (FOBT) of the stool. You may have this test every year starting at age 4.  Flexible sigmoidoscopy or colonoscopy. You may have a sigmoidoscopy every 5 years or a colonoscopy every 10 years starting at age 29.  Prostate cancer screening. Recommendations will vary depending on your family history and other risks.  Hepatitis C blood test.  Hepatitis B blood test.  Sexually transmitted disease (STD) testing.  Diabetes screening. This is done by checking your blood sugar (glucose) after you have not eaten for a while (fasting). You may have this done every 1-3 years.  Abdominal aortic aneurysm (AAA) screening. You may need this if you are a current or former smoker.  Osteoporosis. You may be screened starting at age 73 if you are at high risk. Talk with your health care provider about your test results, treatment  options, and if necessary, the need for more tests. Vaccines  Your health care provider may recommend certain vaccines, such as:  Influenza vaccine. This is recommended every year.  Tetanus, diphtheria, and acellular pertussis (Tdap, Td) vaccine. You may  need a Td booster every 10 years.  Zoster vaccine. You may need this after age 37.  Pneumococcal 13-valent conjugate (PCV13) vaccine. One dose is recommended after age 69.  Pneumococcal polysaccharide (PPSV23) vaccine. One dose is recommended after age 28. Talk to your health care provider about which screenings and vaccines you need and how often you need them. This information is not intended to replace advice given to you by your health care provider. Make sure you discuss any questions you have with your health care provider. Document Released: 04/25/2015 Document Revised: 12/17/2015 Document Reviewed: 01/28/2015 Elsevier Interactive Patient Education  2017 [employer] Prevention in the Home Falls can cause injuries. They can happen to people of all ages. There are many things you can do to make your home safe and to help prevent falls. What can I do on the outside of my home?  Regularly fix the edges of walkways and driveways and fix any cracks.  Remove anything that might make you trip as you walk through a door, such as a raised step or threshold.  Trim any bushes or trees on the path to your home.  Use bright outdoor lighting.  Clear any walking paths of anything that might make someone trip, such as rocks or tools.  Regularly check to see if handrails are loose or broken. Make sure that both sides of any steps have handrails.  Any raised decks and porches should have guardrails on the edges.  Have any leaves, snow, or ice cleared regularly.  Use sand or salt on walking paths during winter.  Clean up any spills in your garage right away. This includes oil or grease spills. What can I do in the bathroom?  Use night lights.  Install grab bars by the toilet and in the tub and shower. Do not use towel bars as grab bars.  Use non-skid mats or decals in the tub or shower.  If you need to sit down in the shower, use a plastic, non-slip stool.  Keep the floor  dry. Clean up any water that spills on the floor as soon as it happens.  Remove soap buildup in the tub or shower regularly.  Attach bath mats securely with double-sided non-slip rug tape.  Do not have throw rugs and other things on the floor that can make you trip. What can I do in the bedroom?  Use night lights.  Make sure that you have a light by your bed that is easy to reach.  Do not use any sheets or blankets that are too big for your bed. They should not hang down onto the floor.  Have a firm chair that has side arms. You can use this for support while you get dressed.  Do not have throw rugs and other things on the floor that can make you trip. What can I do in the kitchen?  Clean up any spills right away.  Avoid walking on wet floors.  Keep items that you use a lot in easy-to-reach places.  If you need to reach something above you, use a strong step stool that has a grab bar.  Keep electrical cords out of the way.  Do not use floor polish or wax that makes floors slippery.  If you must use wax, use non-skid floor wax.  Do not have throw rugs and other things on the floor that can make you trip. What can I do with my stairs?  Do not leave any items on the stairs.  Make sure that there are handrails on both sides of the stairs and use them. Fix handrails that are broken or loose. Make sure that handrails are as long as the stairways.  Check any carpeting to make sure that it is firmly attached to the stairs. Fix any carpet that is loose or worn.  Avoid having throw rugs at the top or bottom of the stairs. If you do have throw rugs, attach them to the floor with carpet tape.  Make sure that you have a light switch at the top of the stairs and the bottom of the stairs. If you do not have them, ask someone to add them for you. What else can I do to help prevent falls?  Wear shoes that:  Do not have high heels.  Have rubber bottoms.  Are comfortable and fit you  well.  Are closed at the toe. Do not wear sandals.  If you use a stepladder:  Make sure that it is fully opened. Do not climb a closed stepladder.  Make sure that both sides of the stepladder are locked into place.  Ask someone to hold it for you, if possible.  Clearly mark and make sure that you can see:  Any grab bars or handrails.  First and last steps.  Where the edge of each step is.  Use tools that help you move around (mobility aids) if they are needed. These include:  Canes.  Walkers.  Scooters.  Crutches.  Turn on the lights when you go into a dark area. Replace any light bulbs as soon as they burn out.  Set up your furniture so you have a clear path. Avoid moving your furniture around.  If any of your floors are uneven, fix them.  If there are any pets around you, be aware of where they are.  Review your medicines with your doctor. Some medicines can make you feel dizzy. This can increase your chance of falling. Ask your doctor what other things that you can do to help prevent falls. This information is not intended to replace advice given to you by your health care provider. Make sure you discuss any questions you have with your health care provider. Document Released: 01/23/2009 Document Revised: 09/04/2015 Document Reviewed: 05/03/2014 Elsevier Interactive Patient Education  2017 [employer].

## 2020-07-22 ENCOUNTER — Telehealth: Payer: Self-pay

## 2020-07-22 ENCOUNTER — Other Ambulatory Visit: Payer: Self-pay | Admitting: Family Medicine

## 2020-07-22 DIAGNOSIS — M170 Bilateral primary osteoarthritis of knee: Secondary | ICD-10-CM

## 2020-07-22 MED ORDER — OXYCODONE-ACETAMINOPHEN 10-325 MG PO TABS: 1.0000 | ORAL_TABLET | Freq: Four times a day (QID) | ORAL | 0 refills | Status: DC | PRN

## 2020-07-22 NOTE — Addendum Note (Signed)
Addended by: Birdie Sons on: 07/22/2020 02:10 PM   Modules accepted: Orders

## 2020-07-22 NOTE — Telephone Encounter (Signed)
Requested medication (s) are due for refill today: yes  Requested medication (s) are on the active medication list: yes  Last refill:  06/17/2020  Future visit scheduled: yes  Notes to clinic: this refill cannot be delegated    Requested Prescriptions  Pending Prescriptions Disp Refills   oxyCODONE-acetaminophen (PERCOCET) 10-325 MG tablet 120 tablet 0    Sig: Take 1 tablet by mouth every 6 (six) hours as needed for pain.      There is no refill protocol information for this order

## 2020-07-22 NOTE — Telephone Encounter (Signed)
Patient called stating that he had requested his Rx for pain medication be sent to [place] and it was sent to [facility].  Would like to have it sent to [facility] drug instead.

## 2020-07-22 NOTE — Telephone Encounter (Signed)
Copied from [place] [phone]. Topic: Quick Communication - Rx Refill/Question >> Jul 22, 2020  9:05 AM Tessa Lerner A wrote: Medication: oxyCODONE-acetaminophen (PERCOCET) 10-325 MG tablet - patient will be hospitalized Thursday 07/24/20  Has the patient contacted their pharmacy? Yes. Patient has spoken with their pharmacy and been told to contact their PCP due to the medication's classification  Preferred Pharmacy (with phone number or street name): [facility], [place] [address]  Phone:  [phone] Fax:  [phone]  Agent: Please be advised that RX refills may take up to 3 business days. We ask that you follow-up with your pharmacy.

## 2020-07-24 DIAGNOSIS — C678 Malignant neoplasm of overlapping sites of bladder: Secondary | ICD-10-CM | POA: Insufficient documentation

## 2020-07-24 HISTORY — PX: CYSTECTOMY W/ URETEROILEAL CONDUIT: SUR361

## 2020-07-31 ENCOUNTER — Other Ambulatory Visit: Payer: Self-pay | Admitting: Family Medicine

## 2020-07-31 DIAGNOSIS — A6000 Herpesviral infection of urogenital system, unspecified: Secondary | ICD-10-CM

## 2020-08-18 ENCOUNTER — Encounter: Payer: Self-pay | Admitting: Family Medicine

## 2020-08-18 ENCOUNTER — Other Ambulatory Visit: Payer: Self-pay

## 2020-08-18 ENCOUNTER — Ambulatory Visit (INDEPENDENT_AMBULATORY_CARE_PROVIDER_SITE_OTHER): Payer: Medicare Other | Admitting: Family Medicine

## 2020-08-18 VITALS — BP 124/74 | HR 100 | Ht 68.0 in | Wt 136.4 lb

## 2020-08-18 DIAGNOSIS — M170 Bilateral primary osteoarthritis of knee: Secondary | ICD-10-CM | POA: Diagnosis not present

## 2020-08-18 DIAGNOSIS — I10 Essential (primary) hypertension: Secondary | ICD-10-CM | POA: Diagnosis not present

## 2020-08-18 DIAGNOSIS — R7303 Prediabetes: Secondary | ICD-10-CM

## 2020-08-18 DIAGNOSIS — R630 Anorexia: Secondary | ICD-10-CM

## 2020-08-18 LAB — POCT GLYCOSYLATED HEMOGLOBIN (HGB A1C)
Estimated Average Glucose: 105
Hemoglobin A1C: 5.3 % (ref 4.0–5.6)

## 2020-08-18 MED ORDER — AMLODIPINE BESYLATE 2.5 MG PO TABS: ORAL_TABLET | ORAL | 1 refills | Status: DC

## 2020-08-18 MED ORDER — CYPROHEPTADINE HCL 4 MG PO TABS: 2.0000 mg | ORAL_TABLET | Freq: Three times a day (TID) | ORAL | 1 refills | Status: DC | PRN

## 2020-08-18 MED ORDER — OXYCODONE-ACETAMINOPHEN 10-325 MG PO TABS: 1.0000 | ORAL_TABLET | Freq: Four times a day (QID) | ORAL | 0 refills | Status: DC | PRN

## 2020-08-18 NOTE — Progress Notes (Signed)
Established patient visit   Patient: Jerry Mcpherson   DOB: Jun 01, 1940   80 y.o. Male  MRN: [id number] Visit Date: 08/18/2020  Today's healthcare provider: Lelon Huh, MD   Chief Complaint  Patient presents with  . Prediabetes  . Hypertension   Subjective    HPI  Hypertension, follow-up  BP Readings from Last 3 Encounters:  08/18/20 124/74  02/18/20 (!) 131/56  01/09/20 (!) 171/86   Wt Readings from Last 3 Encounters:  08/18/20 136 lb 6.4 oz (61.9 kg)  02/18/20 166 lb (75.3 kg)  01/09/20 164 lb (74.4 kg)     He was last seen for hypertension 6 months ago.  BP at that visit was 131/56. Management since that visit includes none; continue current medications.  He reports excellent compliance with treatment. He is not having side effects.  He is following a poor - not eating enough -  diet. He is not exercising. He does not smoke.  Use of agents associated with hypertension: NSAIDS.   Outside blood pressures are n/a. Symptoms: No chest pain No chest pressure  No palpitations No syncope  No dyspnea No orthopnea  No paroxysmal nocturnal dyspnea YES lower extremity edema - sometimes on left foot   Pertinent labs: Lab Results  Component Value Date   CHOL 135 08/26/2017   HDL 30 (L) 08/26/2017   LDLCALC 68 08/26/2017   TRIG 183 (H) 08/26/2017   CHOLHDL 4.5 08/26/2017   Lab Results  Component Value Date   NA 139 07/19/2019   K 3.6 07/19/2019   CREATININE 1.3 07/19/2019   GFRNONAA 52 07/19/2019   GFRAA 72 08/26/2017   GLUCOSE 97 08/26/2017     The ASCVD Risk score (Goff DC Jr., et al., 2013) failed to calculate for the following reasons:   The 2013 ASCVD risk score is only valid for ages 33 to 80   --------------------------------------------------------------------------------------------------- PreDiabetes Mellitus Type II, Follow-up  Lab Results  Component Value Date   HGBA1C 5.3 08/18/2020   HGBA1C 5.8 (A) 02/18/2020   HGBA1C 6.0 (A)  08/15/2019   Wt Readings from Last 3 Encounters:  08/18/20 136 lb 6.4 oz (61.9 kg)  02/18/20 166 lb (75.3 kg)  01/09/20 164 lb (74.4 kg)   Last seen for diabetes 6 months ago.  Management since then includes none; continue current medications. He reports excellent compliance with treatment. He is not having side effects.  Symptoms: Yes fatigue No foot ulcerations  Yes appetite changes No nausea  No paresthesia of the feet  No polydipsia  No polyuria No visual disturbances   No vomiting     Home blood sugar records: n/a  Episodes of hypoglycemia? No    Current insulin regiment: n/a Most Recent Eye Exam: n/a Current exercise: none Current diet habits: irregular - not eating much  Pertinent Labs: Lab Results  Component Value Date   CHOL 135 08/26/2017   HDL 30 (L) 08/26/2017   LDLCALC 68 08/26/2017   TRIG 183 (H) 08/26/2017   CHOLHDL 4.5 08/26/2017   Lab Results  Component Value Date   NA 139 07/19/2019   K 3.6 07/19/2019   CREATININE 1.3 07/19/2019   GFRNONAA 52 07/19/2019   GFRAA 72 08/26/2017   GLUCOSE 97 08/26/2017     --------------------------------------------------------------------------------------------------- He had cystectomy and prostatectomy last month at [facility] for bladder cancer extending to prostate. He has since had very poor appetite with significant weight loss. He does report he was able to eat a full  meal of soup and salad yesterday. Not really nauseated. Is sore round surgical site but no other abdominal pain.      Medications: Outpatient Medications Prior to Visit  Medication Sig  . acetaminophen (TYLENOL) 500 MG tablet Take 2 tablets (1,000 mg total) by mouth every 6 (six) hours as needed for mild pain or moderate pain.  Marland Kitchen amLODipine (NORVASC) 2.5 MG tablet TAKE 1 TABLET(2.5 MG) BY MOUTH DAILY  . Ascorbic Acid (VITAMIN C) 1000 MG tablet Take 2 tablets by mouth daily.  Marland Kitchen aspirin 81 MG tablet Take 2 tablets daily  . budesonide-formoterol  (SYMBICORT) 160-4.5 MCG/ACT inhaler Inhale 2 puffs into the lungs 2 (two) times daily.  . diclofenac Sodium (VOLTAREN) 1 % GEL Apply 4 g topically 4 (four) times daily as needed.  . docusate sodium (COLACE) 100 MG capsule Take 1 capsule by mouth daily as needed.  . hydrochlorothiazide (HYDRODIURIL) 25 MG tablet TAKE 1 TABLET(25 MG) BY MOUTH DAILY  . hydroxyurea (HYDREA) 500 MG capsule Take 500 mg by mouth as directed. 2 tablets 3 days a week, and 1 tablet all other days  . Multiple Vitamins-Minerals (MULTIVITAMIN ADULT PO) Take 1 tablet by mouth daily.  . naproxen (NAPROSYN) 500 MG tablet TAKE 1 TABLET(500 MG) BY MOUTH TWICE DAILY WITH A MEAL  . oxyCODONE-acetaminophen (PERCOCET) 10-325 MG tablet Take 1 tablet by mouth every 6 (six) hours as needed for pain.  . sildenafil (VIAGRA) 50 MG tablet Take 0.5-1 tablets by mouth daily as needed.  Marland Kitchen UNABLE TO FIND Med Name: tumeric 500mg  twice a day  . valACYclovir (VALTREX) 1000 MG tablet TAKE (1) TABLET BY MOUTH TWICE DAILY AS NEEDED   No facility-administered medications prior to visit.    Review of Systems     Objective    BP 124/74 (BP Location: Right Arm, Patient Position: Sitting, Cuff Size: Small)   Pulse 100   Ht 5\' 8"  (1.727 m)   Wt 136 lb 6.4 oz (61.9 kg)   BMI 20.74 kg/m     Physical Exam   General appearance: Thin male, cooperative and in no acute distress Head: Normocephalic, without obvious abnormality, atraumatic Respiratory: Respirations even and unlabored, normal respiratory rate Extremities: All extremities are intact.  Skin: Skin color, texture, turgor normal. No rashes seen  Psych: Appropriate mood and affect. Neurologic: Mental status: Alert, oriented to person, place, and time, thought content appropriate.   Results for orders placed or performed in visit on 08/18/20  POCT HgB A1C  Result Value Ref Range   Hemoglobin A1C 5.3 4.0 - 5.6 %   Estimated Average Glucose 105     Assessment & Plan     1.  Prediabetes Normal a1c today. Has had weight loss secondary to recent surgery and bladder cancer diagnosis.   2. Loss of appetite  - cyproheptadine (PERIACTIN) 4 MG tablet; Take 0.5-1 tablets (2-4 mg total) by mouth 3 (three) times daily as needed (to help appetitie).  Dispense: 60 tablet; Refill: 1  3. Primary osteoarthritis of both knees refill- oxyCODONE-acetaminophen (PERCOCET) 10-325 MG tablet; Take 1 tablet by mouth every 6 (six) hours as needed for pain.  Dispense: 120 tablet; Refill: 0  4. Essential hypertension - amLODipine (NORVASC) 2.5 MG tablet; PUT ON HOLD FOR THE TIME BEING  Dispense: 90 tablet; Refill: 1   Recheck in 6 weeks.       The entirety of the information documented in the History of Present Illness, Review of Systems and Physical Exam were personally obtained by  me. Portions of this information were initially documented by the CMA and reviewed by me for thoroughness and accuracy.      Lelon Huh, MD  [facility] [phone] (phone) [phone] (fax)  [place]

## 2020-09-16 ENCOUNTER — Telehealth: Payer: Self-pay | Admitting: Family Medicine

## 2020-09-16 DIAGNOSIS — M170 Bilateral primary osteoarthritis of knee: Secondary | ICD-10-CM

## 2020-09-16 MED ORDER — OXYCODONE-ACETAMINOPHEN 10-325 MG PO TABS: 1.0000 | ORAL_TABLET | Freq: Four times a day (QID) | ORAL | 0 refills | Status: DC | PRN

## 2020-09-16 NOTE — Telephone Encounter (Signed)
Medication Refill - Medication: oxycodone  Has the patient contacted their pharmacy? No, pt knew he needed to call PCP   Preferred Pharmacy (with phone number or street name): [facility] - Store [phone] [address] [place], [place] [address]  Agent: Please be advised that RX refills may take up to 3 business days. We ask that you follow-up with your pharmacy.

## 2020-09-17 MED ORDER — OXYCODONE-ACETAMINOPHEN 10-325 MG PO TABS: 1.0000 | ORAL_TABLET | Freq: Four times a day (QID) | ORAL | 0 refills | Status: DC | PRN

## 2020-09-17 NOTE — Addendum Note (Signed)
Addended by: Birdie Sons on: 09/17/2020 02:39 PM   Modules accepted: Orders

## 2020-09-17 NOTE — Telephone Encounter (Signed)
Patient is calling again because he still has not gotten his medication.  It is showing that it was received on 6/7 at 5:48 but the pharmacy said they do not have it.  Please advise.

## 2020-09-17 NOTE — Telephone Encounter (Signed)
Patient has been advised, and contacted by pharmacy. Will contact [facility] to cancel prescription. KW

## 2020-09-17 NOTE — Telephone Encounter (Signed)
Patient is calling again to inform the nurse or doctor that the pharmacy said they do not have the script for patient.  Please advise.

## 2020-09-17 NOTE — Telephone Encounter (Signed)
Prescription was originally sent to [facility] instead of [facility]. Have resent to [facility]. Please call [facility] and cancel prescription that was sent there.

## 2020-09-29 ENCOUNTER — Encounter: Payer: Self-pay | Admitting: Family Medicine

## 2020-09-29 ENCOUNTER — Other Ambulatory Visit: Payer: Self-pay

## 2020-09-29 ENCOUNTER — Ambulatory Visit (INDEPENDENT_AMBULATORY_CARE_PROVIDER_SITE_OTHER): Payer: Medicare Other | Admitting: Family Medicine

## 2020-09-29 VITALS — BP 115/68 | HR 77 | Temp 97.3°F | Resp 18 | Wt 142.8 lb

## 2020-09-29 DIAGNOSIS — R630 Anorexia: Secondary | ICD-10-CM | POA: Diagnosis not present

## 2020-09-29 DIAGNOSIS — I10 Essential (primary) hypertension: Secondary | ICD-10-CM | POA: Diagnosis not present

## 2020-09-29 MED ORDER — CYPROHEPTADINE HCL 4 MG PO TABS: 4.0000 mg | ORAL_TABLET | ORAL | Status: DC

## 2020-09-29 NOTE — Progress Notes (Signed)
Established patient visit   Patient: Jerry Mcpherson   DOB: 1940/07/08   80 y.o. Male  MRN: [id number] Visit Date: 09/29/2020  Today's healthcare provider: Lelon Huh, MD   Chief Complaint  Patient presents with   Hypertension    Subjective    HPI  Hypertension, follow-up  BP Readings from Last 3 Encounters:  09/29/20 115/68  08/18/20 124/74  02/18/20 (!) 131/56   Wt Readings from Last 3 Encounters:  09/29/20 142 lb 12.8 oz (64.8 kg)  08/18/20 136 lb 6.4 oz (61.9 kg)  02/18/20 166 lb (75.3 kg)     He was last seen for hypertension 6 weeks ago.  BP at that visit was 124/74. Management since that visit includes putting Amlodipine on hold for the time being due to weight loss and well controlled blood pressure.   He reports good compliance with treatment. He is not having side effects.  He is following a Regular diet. He is not exercising. He does not smoke.  Use of agents associated with hypertension: NSAIDS.   Outside blood pressures are 128/80 (checked yesterday by home health nurse). Symptoms: No chest pain No chest pressure  No palpitations No syncope  No dyspnea No orthopnea  No paroxysmal nocturnal dyspnea Yes lower extremity edema (in feet)   Pertinent labs: Lab Results  Component Value Date   CHOL 135 08/26/2017   HDL 30 (L) 08/26/2017   LDLCALC 68 08/26/2017   TRIG 183 (H) 08/26/2017   CHOLHDL 4.5 08/26/2017   Lab Results  Component Value Date   NA 139 07/19/2019   K 3.6 07/19/2019   CREATININE 1.3 07/19/2019   GFRNONAA 52 07/19/2019   GFRAA 72 08/26/2017   GLUCOSE 97 08/26/2017     The ASCVD Risk score (Goff DC Jr., et al., 2013) failed to calculate for the following reasons:   The 2013 ASCVD risk score is only valid for ages 82 to 23    He was also prescribed periactin at his last visit due to poor appetite. Since then his taste has come back. He has been taking periactin once every morning.   ---------------------------------------------------------------------------------------------------      Medications: Outpatient Medications Prior to Visit  Medication Sig   acetaminophen (TYLENOL) 500 MG tablet Take 2 tablets (1,000 mg total) by mouth every 6 (six) hours as needed for mild pain or moderate pain.   Ascorbic Acid (VITAMIN C) 1000 MG tablet Take 2 tablets by mouth daily.   aspirin 81 MG tablet Take 2 tablets daily   budesonide-formoterol (SYMBICORT) 160-4.5 MCG/ACT inhaler Inhale 2 puffs into the lungs 2 (two) times daily.   cyproheptadine (PERIACTIN) 4 MG tablet Take 0.5-1 tablets (2-4 mg total) by mouth 3 (three) times daily as needed (to help appetitie).   diclofenac Sodium (VOLTAREN) 1 % GEL Apply 4 g topically 4 (four) times daily as needed.   docusate sodium (COLACE) 100 MG capsule Take 1 capsule by mouth daily as needed.   hydrochlorothiazide (HYDRODIURIL) 25 MG tablet TAKE 1 TABLET(25 MG) BY MOUTH DAILY   hydroxyurea (HYDREA) 500 MG capsule Take 500 mg by mouth as directed. 2 tablets 3 days a week, and 1 tablet all other days   Multiple Vitamins-Minerals (MULTIVITAMIN ADULT PO) Take 1 tablet by mouth daily.   naproxen (NAPROSYN) 500 MG tablet TAKE 1 TABLET(500 MG) BY MOUTH TWICE DAILY WITH A MEAL   oxyCODONE-acetaminophen (PERCOCET) 10-325 MG tablet Take 1 tablet by mouth every 6 (six) hours as needed for  pain.   sildenafil (VIAGRA) 50 MG tablet Take 0.5-1 tablets by mouth daily as needed.   UNABLE TO FIND Med Name: tumeric 500mg  twice a day   valACYclovir (VALTREX) 1000 MG tablet TAKE (1) TABLET BY MOUTH TWICE DAILY AS NEEDED   amLODipine (NORVASC) 2.5 MG tablet PUT ON HOLD FOR THE TIME BEING (Patient not taking: Reported on 09/29/2020)   No facility-administered medications prior to visit.    Review of Systems  Constitutional:  Negative for appetite change, chills and fever.  Respiratory:  Negative for chest tightness, shortness of breath and wheezing.    Cardiovascular:  Negative for chest pain and palpitations.  Gastrointestinal:  Negative for abdominal pain, nausea and vomiting.  Musculoskeletal:  Positive for joint swelling (in feet).      Objective    BP 115/68 (BP Location: Left Arm, Patient Position: Sitting, Cuff Size: Normal)   Pulse 77   Temp (!) 97.3 F (36.3 C) (Temporal)   Resp 18   Wt 142 lb 12.8 oz (64.8 kg)   BMI 21.71 kg/m     Physical Exam  General appearance: Well developed, well nourished male, cooperative and in no acute distress Head: Normocephalic, without obvious abnormality, atraumatic Respiratory: Respirations even and unlabored, normal respiratory rate Extremities: All extremities are intact.  Skin: Skin color, texture, turgor normal. No rashes seen  Psych: Appropriate mood and affect. Neurologic: Mental status: Alert, oriented to person, place, and time, thought content appropriate.     Assessment & Plan     1. Loss of appetite Much better since last visit. Is now taking cyproheptadine once daily. Effects of chemo are likely starting to dissipate and he can try stopping or only taking cyproheptadine intermittently.    2. Primary hypertension Well controlled since putting amlodipine on hold. Discontinued from medication list.    Future Appointments  Date Time Provider [place]  01/28/2021  9:40 AM Birdie Sons, MD [facility]  07/23/2021  9:00 AM [facility]-NURSE HEALTH ADVISOR [facility]        The entirety of the information documented in the History of Present Illness, Review of Systems and Physical Exam were personally obtained by me. Portions of this information were initially documented by the CMA and reviewed by me for thoroughness and accuracy.     Lelon Huh, MD  [facility] [phone] (phone) [phone] (fax)  [place]

## 2020-10-07 ENCOUNTER — Telehealth: Payer: Self-pay

## 2020-10-07 NOTE — Telephone Encounter (Signed)
Copied from [place] [id number]. Topic: General - Call Back - No Documentation >> Oct 07, 2020 11:30 AM Erick Blinks wrote: Reason for CRM: Pt called and reported that [facility] wants him to have lab work with his provider, Pt was given a paper print out but has lost the sheet. Please advise   Best contact: [phone]   Dr. Greg Cutter is his provider at [facility].

## 2020-10-07 NOTE — Telephone Encounter (Signed)
He needs to contact Dr. Lady Gary to give him a print out of the labs needed.

## 2020-10-10 NOTE — Telephone Encounter (Signed)
Pt called back in stating he wanted to get some lab orders, that Dr. Lady Gary wants him to get through PCP, please advise.

## 2020-10-11 ENCOUNTER — Other Ambulatory Visit: Payer: Self-pay | Admitting: Family Medicine

## 2020-10-11 DIAGNOSIS — I10 Essential (primary) hypertension: Secondary | ICD-10-CM

## 2020-10-11 NOTE — Telephone Encounter (Signed)
Requested medication (s) are due for refill today: yes  Requested medication (s) are on the active medication list: yes  Last refill:  06/10/20 #90  Future visit scheduled: yes  Notes to clinic:  overdue lab work   Requested Prescriptions  Pending Prescriptions Disp Refills   hydrochlorothiazide (HYDRODIURIL) 25 MG tablet [Pharmacy Med Name: HYDROCHLOROTHIAZIDE 25 MG TAB] 90 tablet 0    Sig: TAKE ONE (1) TABLET BY MOUTH ONCE DAILY      Cardiovascular: Diuretics - Thiazide Failed - 10/11/2020  9:55 AM      Failed - Ca in normal range and within 360 days    Calcium  Date Value Ref Range Status  07/19/2019 8.7 8.7 - 10.7 Final          Failed - Cr in normal range and within 360 days    Creatinine  Date Value Ref Range Status  07/19/2019 1.3 0.6 - 1.3 Final   Creatinine, Ser  Date Value Ref Range Status  08/26/2017 1.12 0.76 - 1.27 mg/dL Final          Failed - K in normal range and within 360 days    Potassium  Date Value Ref Range Status  07/19/2019 3.6 3.4 - 5.3 Final          Failed - Na in normal range and within 360 days    Sodium  Date Value Ref Range Status  07/19/2019 139 137 - 147 Final          Passed - Last BP in normal range    BP Readings from Last 1 Encounters:  09/29/20 115/68          Passed - Valid encounter within last 6 months    Recent Outpatient Visits           1 week ago Primary hypertension   [facility] Birdie Sons, MD   1 month ago Prediabetes   [facility] Birdie Sons, MD   7 months ago Pre-diabetes   [facility] Birdie Sons, MD   9 months ago [place], Donald E, MD   11 months ago Acute pain of left knee   [facility] Birdie Sons, MD       Future Appointments             In 3 months Fisher, [name], MD [facility] [place]

## 2020-10-12 NOTE — Telephone Encounter (Signed)
I don't know what labs he wants

## 2020-10-14 ENCOUNTER — Telehealth: Payer: Self-pay | Admitting: Family Medicine

## 2020-10-14 DIAGNOSIS — M170 Bilateral primary osteoarthritis of knee: Secondary | ICD-10-CM

## 2020-10-14 MED ORDER — OXYCODONE-ACETAMINOPHEN 10-325 MG PO TABS: 1.0000 | ORAL_TABLET | Freq: Four times a day (QID) | ORAL | 0 refills | Status: DC | PRN

## 2020-10-14 NOTE — Telephone Encounter (Signed)
Medication: oxyCODONE-acetaminophen (PERCOCET) 10-325 MG tablet [[id number]]   Has the patient contacted their pharmacy? YES  (Agent: If no, request that the patient contact the pharmacy for the refill.) (Agent: If yes, when and what did the pharmacy advise?)  Preferred Pharmacy (with phone number or street name): [facility] [place], [place] [address]  [place], [place] [address]  Phone:  [phone]  Fax:  [phone]     Agent: Please be advised that RX refills may take up to 3 business days. We ask that you follow-up with your pharmacy.

## 2020-10-14 NOTE — Telephone Encounter (Signed)
Requested medication (s) are due for refill today: yes  Requested medication (s) are on the active medication list: yes   Last refill:  09/16/2020  Future visit scheduled:  yes   Notes to clinic:  this refill cannot be delegated    Requested Prescriptions  Pending Prescriptions Disp Refills   oxyCODONE-acetaminophen (PERCOCET) 10-325 MG tablet 120 tablet 0    Sig: Take 1 tablet by mouth every 6 (six) hours as needed for pain.      Not Delegated - Analgesics:  Opioid Agonist Combinations Failed - 10/14/2020 11:31 AM      Failed - This refill cannot be delegated      Failed - Urine Drug Screen completed in last 360 days      Passed - Valid encounter within last 6 months    Recent Outpatient Visits           2 weeks ago Primary hypertension   [facility] Birdie Sons, MD   1 month ago Prediabetes   [facility] Birdie Sons, MD   7 months ago Pre-diabetes   [facility] Birdie Sons, MD   9 months ago [place], Donald E, MD   11 months ago Acute pain of left knee   [facility] Birdie Sons, MD       Future Appointments             In 3 months Fisher, [name], MD [facility] [place]

## 2020-10-14 NOTE — Telephone Encounter (Signed)
Tried calling; pt's voicemail is not set up.  [facility] please find out what labs pt needs if he calls back.   Thanks,   -[name]

## 2020-10-14 NOTE — Telephone Encounter (Signed)
Pt called this morning saying [facility] was supposed to fax [facility] about the labs needing to be done.    BUN and Creatine is hat they would like to be done.  CB# [phone]

## 2020-10-15 MED ORDER — OXYCODONE-ACETAMINOPHEN 10-325 MG PO TABS: 1.0000 | ORAL_TABLET | Freq: Four times a day (QID) | ORAL | 0 refills | Status: DC | PRN

## 2020-10-15 NOTE — Telephone Encounter (Signed)
Pt calling in regarding this prescription. He states that he is needing to have this sent to [facility] instead. Please advise.       [facility] [place], [place] [address] AT [address]  [place] [address]  Phone: [phone] Fax: [phone]  Hours: Not open 24 hours

## 2020-10-15 NOTE — Telephone Encounter (Signed)
I called and cancelled prescription at [facility] drug pharmacy. I called patient and advised him that his prescription was resent to [facility] as requested.

## 2020-10-15 NOTE — Telephone Encounter (Signed)
Have resent to [facility]. Please call [facility] store to cancel prescription that was sent earlier.

## 2020-10-15 NOTE — Addendum Note (Signed)
Addended by: Birdie Sons on: 10/15/2020 10:44 AM   Modules accepted: Orders

## 2020-10-23 ENCOUNTER — Ambulatory Visit: Payer: Self-pay | Admitting: *Deleted

## 2020-10-23 ENCOUNTER — Telehealth: Payer: Self-pay | Admitting: Family Medicine

## 2020-10-23 NOTE — Telephone Encounter (Signed)
His home blood pressure are what matters. If he doesn't haven't an automatic BP cuff then he should get one and let me know if the home readings are consistently over 140.

## 2020-10-23 NOTE — Telephone Encounter (Addendum)
[place] with [employer] called into [facility] because Dr. Caryn Section is this pt's PCP. 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   They are unable to get in contact with the oncologist that ordered the U/S.   They have tried several times for the last 30 minutes without success. "It's our policy that if we can't contact the ordering provider than we contact the primary care physician".   "I'm sorry to even have to drag you into this situation but we are unable to contact the oncologist and with this finding it has to be reported".    "We are going to send the pt to our ED".     Again the only reason I contacted your office was to document that this got reported.   We will send him to the ED to begin care.  I sent this message to [facility] for their information high priority. I attempted to call into [facility] however there was no answer.   I called into [facility] and spoke with Elmyra Ricks.   I let her know about the message and why [facility] had called and that they did not need anything from Dr. Caryn Section.   (Dr. Caryn Section not in office today).  It just needed to be documented that the PCP was contacted since they were not able to get in contact with the ordering oncologist.

## 2020-10-23 NOTE — Telephone Encounter (Signed)
Pt went to [facility] yesterday and his bp was 155/78 or 15 and patient was told to call dr Caryn Section to see if dr Caryn Section would like for him to resume taking amlodipine. Pt said [facility] will send dr Caryn Section the records

## 2020-10-24 NOTE — Telephone Encounter (Signed)
Did he go to ER or hear back from the ordering physician. I don't see anything in his chart about it.

## 2020-10-24 NOTE — Telephone Encounter (Signed)
I spoke with Jerry Mcpherson.  He says he is still in the ER waiting to see if he is going to be admitted.  He says it depends on what the latest lab results show.    Thanks,   -[name]

## 2020-10-24 NOTE — Telephone Encounter (Signed)
Spoke with [name].  He is at [facility] right now with a blood clot.    Thanks,   -[name]

## 2020-10-28 ENCOUNTER — Ambulatory Visit (INDEPENDENT_AMBULATORY_CARE_PROVIDER_SITE_OTHER): Payer: Medicare Other | Admitting: Family Medicine

## 2020-10-28 ENCOUNTER — Other Ambulatory Visit: Payer: Self-pay

## 2020-10-28 ENCOUNTER — Encounter: Payer: Self-pay | Admitting: Family Medicine

## 2020-10-28 VITALS — BP 137/77 | HR 80 | Temp 97.5°F | Resp 16 | Wt 149.0 lb

## 2020-10-28 DIAGNOSIS — I82411 Acute embolism and thrombosis of right femoral vein: Secondary | ICD-10-CM

## 2020-10-28 DIAGNOSIS — I10 Essential (primary) hypertension: Secondary | ICD-10-CM | POA: Diagnosis not present

## 2020-10-28 HISTORY — DX: Acute embolism and thrombosis of right femoral vein: I82.411

## 2020-10-28 MED ORDER — AMLODIPINE BESYLATE 2.5 MG PO TABS: 2.5000 mg | ORAL_TABLET | Freq: Every day | ORAL | Status: DC

## 2020-10-28 NOTE — Progress Notes (Signed)
Established patient visit   Patient: Jerry Mcpherson   DOB: 03-18-1941   80 y.o. Male  MRN: [id number] Visit Date: 10/28/2020  Today's healthcare provider: Lelon Huh, MD   Chief Complaint  Patient presents with   Follow-up   DVT   Subjective    HPI  Follow up ER visit  Patient was seen in [place] ER for DVT of right lower extremity on 10/24/2020.  Treatment for this included starting Eliquis 10 mg of this medication twice daily for the first 7 days, and then 5mg  twice daily thereafter. Follow up with PCP in 1 week  He reports good compliance with treatment. He reports this condition is Unchanged. Patient still has swelling in his right foot, ankle and lower leg.  -----------------------------------------------------------------------------------------      Medications: Outpatient Medications Prior to Visit  Medication Sig   acetaminophen (TYLENOL) 500 MG tablet Take 2 tablets (1,000 mg total) by mouth every 6 (six) hours as needed for mild pain or moderate pain.   Ascorbic Acid (VITAMIN C) 1000 MG tablet Take 2 tablets by mouth daily.   aspirin 81 MG tablet Take 2 tablets daily   budesonide-formoterol (SYMBICORT) 160-4.5 MCG/ACT inhaler Inhale 2 puffs into the lungs 2 (two) times daily.   cyproheptadine (PERIACTIN) 4 MG tablet Take 1 tablet (4 mg total) by mouth every morning.   diclofenac Sodium (VOLTAREN) 1 % GEL Apply 4 g topically 4 (four) times daily as needed.   docusate sodium (COLACE) 100 MG capsule Take 1 capsule by mouth daily as needed.   ELIQUIS 5 MG TABS tablet Take 1 tablet by mouth in the morning and at bedtime.   hydrochlorothiazide (HYDRODIURIL) 25 MG tablet Take 1 tablet (25 mg total) by mouth daily.   hydroxyurea (HYDREA) 500 MG capsule Take 500 mg by mouth as directed. 2 tablets 3 days a week, and 1 tablet all other days   Multiple Vitamins-Minerals (MULTIVITAMIN ADULT PO) Take 1 tablet by mouth daily.   naproxen (NAPROSYN) 500 MG tablet TAKE  1 TABLET(500 MG) BY MOUTH TWICE DAILY WITH A MEAL   oxyCODONE-acetaminophen (PERCOCET) 10-325 MG tablet Take 1 tablet by mouth every 6 (six) hours as needed for pain.   sildenafil (VIAGRA) 50 MG tablet Take 0.5-1 tablets by mouth daily as needed.   UNABLE TO FIND Med Name: tumeric 500mg  twice a day   valACYclovir (VALTREX) 1000 MG tablet TAKE (1) TABLET BY MOUTH TWICE DAILY AS NEEDED   No facility-administered medications prior to visit.    Review of Systems  Constitutional:  Negative for appetite change, chills and fever.  Respiratory:  Negative for chest tightness, shortness of breath and wheezing.   Cardiovascular:  Positive for leg swelling. Negative for chest pain and palpitations.  Gastrointestinal:  Negative for abdominal pain, nausea and vomiting.      Objective    BP 137/77 (BP Location: Right Arm, Patient Position: Sitting, Cuff Size: Normal)   Pulse 80   Temp (!) 97.5 F (36.4 C) (Temporal)   Resp 16   Wt 149 lb (67.6 kg)   BMI 22.66 kg/m     Physical Exam  General appearance: Well developed, well nourished male, cooperative and in no acute distress Head: Normocephalic, without obvious abnormality, atraumatic Respiratory: Respirations even and unlabored, normal respiratory rate Extremities: Slightly swollen right foot and ankle, mildly erythematous. By patient report swelling is down from last week.    Assessment & Plan     1. Acute deep vein  thrombosis (DVT) of right femoral vein (HCC) On day 4 of Eliquis 10mg  BID, to change to 5mg  BID on day 7. Has follow up scheduled with cardiology this week, hematology next week and oncology the week after.   Will be sure he gets refill of Eliquis before current prescription prescribed by his oncologist runs out.   2. Primary hypertension BP has come up since patient has put back on some weight. Will restart amLODipine (NORVASC) 2.5 MG tablet; Take 1 tablet (2.5 mg total) by mouth daily.   Future Appointments  Date Time  Provider [place]  12/23/2020 11:00 AM Birdie Sons, MD [facility]  01/28/2021  9:40 AM Birdie Sons, MD [facility]  07/23/2021  9:00 AM [facility]-NURSE HEALTH ADVISOR [facility]        The entirety of the information documented in the History of Present Illness, Review of Systems and Physical Exam were personally obtained by me. Portions of this information were initially documented by the CMA and reviewed by me for thoroughness and accuracy.     Lelon Huh, MD  [facility] [phone] (phone) [phone] (fax)  [place]

## 2020-11-12 ENCOUNTER — Other Ambulatory Visit: Payer: Self-pay | Admitting: Family Medicine

## 2020-11-12 DIAGNOSIS — M170 Bilateral primary osteoarthritis of knee: Secondary | ICD-10-CM

## 2020-11-12 MED ORDER — OXYCODONE-ACETAMINOPHEN 10-325 MG PO TABS: 1.0000 | ORAL_TABLET | Freq: Four times a day (QID) | ORAL | 0 refills | Status: DC | PRN

## 2020-11-12 NOTE — Telephone Encounter (Signed)
Requested medication (s) are due for refill today: Yes  Requested medication (s) are on the active medication list: Yes  Last refill:  10/15/20  Future visit scheduled: Yes  Notes to clinic:  See request.    Requested Prescriptions  Pending Prescriptions Disp Refills   oxyCODONE-acetaminophen (PERCOCET) 10-325 MG tablet 120 tablet 0    Sig: Take 1 tablet by mouth every 6 (six) hours as needed for pain.      Not Delegated - Analgesics:  Opioid Agonist Combinations Failed - 11/12/2020 10:46 AM      Failed - This refill cannot be delegated      Failed - Urine Drug Screen completed in last 360 days      Passed - Valid encounter within last 6 months    Recent Outpatient Visits           2 weeks ago Acute deep vein thrombosis (DVT) of right femoral vein [facility])   [facility] Birdie Sons, MD   1 month ago Primary hypertension   [facility] Birdie Sons, MD   2 months ago Prediabetes   [facility] Birdie Sons, MD   8 months ago [place], Donald E, MD   10 months ago [place], Donald E, MD       Future Appointments             In 1 month Fisher, [name], MD [facility], [place]   In 2 months Fisher, [name], MD [facility], [place]

## 2020-11-12 NOTE — Telephone Encounter (Signed)
Copied from [place] [id number]. Topic: Quick Communication - Rx Refill/Question >> Nov 12, 2020  8:44 AM Tessa Lerner A wrote: Medication: oxyCODONE-acetaminophen (PERCOCET) 10-325 MG tablet   Has the patient contacted their pharmacy? No. (Agent: If no, request that the patient contact the pharmacy for the refill.) (Agent: If yes, when and what did the pharmacy advise?)  Preferred Pharmacy (with phone number or street name): [facility] DRUG STORE [id number] [facility], [place] [address] AT [place]  Phone:  [phone] Fax:  [phone]  Agent: Please be advised that RX refills may take up to 3 business days. We ask that you follow-up with your pharmacy.

## 2020-11-14 ENCOUNTER — Other Ambulatory Visit: Payer: Self-pay | Admitting: Family Medicine

## 2020-11-14 DIAGNOSIS — I82411 Acute embolism and thrombosis of right femoral vein: Secondary | ICD-10-CM

## 2020-11-14 MED ORDER — ELIQUIS 5 MG PO TABS: 5.0000 mg | ORAL_TABLET | Freq: Two times a day (BID) | ORAL | 3 refills | Status: DC

## 2020-12-10 ENCOUNTER — Other Ambulatory Visit: Payer: Self-pay | Admitting: Family Medicine

## 2020-12-10 DIAGNOSIS — M170 Bilateral primary osteoarthritis of knee: Secondary | ICD-10-CM

## 2020-12-10 NOTE — Telephone Encounter (Signed)
Requested medications are due for refill today.  yes  Requested medications are on the active medications list.  yes  Last refill. 11/12/2020  Future visit scheduled.   yes  Notes to clinic.  Medication not delegated.

## 2020-12-10 NOTE — Telephone Encounter (Signed)
Copied from [place] [id number]. Topic: Quick Communication - Rx Refill/Question >> Dec 10, 2020  4:34 PM Yvette Rack wrote: Medication: oxyCODONE-acetaminophen (PERCOCET) 10-325 MG tablet  Has the patient contacted their pharmacy? No. (Agent: If no, request that the patient contact the pharmacy for the refill.) (Agent: If yes, when and what did the pharmacy advise?)  Preferred Pharmacy (with phone number or street name): [facility] DRUG STORE [id number] [facility], [place] [address] AT [place] Phone: [phone]   Fax: [phone]  Agent: Please be advised that RX refills may take up to 3 business days. We ask that you follow-up with your pharmacy.

## 2020-12-11 ENCOUNTER — Other Ambulatory Visit: Payer: Self-pay | Admitting: Family Medicine

## 2020-12-11 DIAGNOSIS — I10 Essential (primary) hypertension: Secondary | ICD-10-CM

## 2020-12-11 MED ORDER — OXYCODONE-ACETAMINOPHEN 10-325 MG PO TABS: 1.0000 | ORAL_TABLET | Freq: Four times a day (QID) | ORAL | 0 refills | Status: DC | PRN

## 2020-12-11 NOTE — Telephone Encounter (Signed)
Pt called to see if Dr. Caryn Section received his request for Oxycodone / he wanted to make sure it was sent before the holiday / please advise when sent

## 2020-12-12 ENCOUNTER — Telehealth: Payer: Self-pay | Admitting: Family Medicine

## 2020-12-12 NOTE — Telephone Encounter (Signed)
Patient called in say that he found that his Rx was sent to [place] and he will pick it up this time so please disregard previous message

## 2020-12-12 NOTE — Telephone Encounter (Unsigned)
Copied from [place] [id number]. Topic: Quick Communication - Rx Refill/Question >> Dec 12, 2020  1:20 PM Lenon Curt, [name] wrote: Medication: oxyCODONE-acetaminophen (PERCOCET) 10-325 MG tablet [id number]   Has the patient contacted their pharmacy? Yes.   (Agent: If no, request that the patient contact the pharmacy for the refill.) (Agent: If yes, when and what did the pharmacy advise?)  Preferred Pharmacy (with phone number or street name): [facility] DRUG STORE [id number] [facility], [place] [address] AT [place]  Phone:  [phone] Fax:  [phone]   Agent: Please be advised that RX refills may take up to 3 business days. We ask that you follow-up with your pharmacy.

## 2020-12-20 ENCOUNTER — Other Ambulatory Visit: Payer: Self-pay | Admitting: Family Medicine

## 2020-12-20 DIAGNOSIS — R630 Anorexia: Secondary | ICD-10-CM

## 2020-12-20 NOTE — Telephone Encounter (Signed)
Requested medication (s) are due for refill today: -  Requested medication (s) are on the active medication list: yes  Last refill:  09/29/20  Future visit scheduled: yes  Notes to clinic:  last RX "no print" and could not find quantity ordered   Requested Prescriptions  Pending Prescriptions Disp Refills   cyproheptadine (PERIACTIN) 4 MG tablet [Pharmacy Med Name: CYPROHEPTADINE '4MG'$  TABLETS] 60 tablet     Sig: TAKE 1/2-1 TABLET BY MOUTH THREE TIMES DAILY AS NEEDED TO HELP APPETITE     Ear, Nose, and Throat:  Antihistamines Passed - 12/20/2020 11:06 AM      Passed - Valid encounter within last 12 months    Recent Outpatient Visits           1 month ago Acute deep vein thrombosis (DVT) of right femoral vein [facility])   [facility] Birdie Sons, MD   2 months ago Primary hypertension   [facility] Birdie Sons, MD   4 months ago Prediabetes   [facility] Birdie Sons, MD   10 months ago Pre-diabetes   [facility] Birdie Sons, MD   11 months ago [place], [name], MD       Future Appointments             In 3 days Fisher, [name], MD [facility], [place]   In 1 month Fisher, [name], MD [facility], [place]

## 2020-12-23 ENCOUNTER — Other Ambulatory Visit: Payer: Self-pay

## 2020-12-23 ENCOUNTER — Ambulatory Visit (INDEPENDENT_AMBULATORY_CARE_PROVIDER_SITE_OTHER): Payer: Medicare Other | Admitting: Family Medicine

## 2020-12-23 DIAGNOSIS — L0591 Pilonidal cyst without abscess: Secondary | ICD-10-CM | POA: Diagnosis not present

## 2020-12-23 DIAGNOSIS — I82411 Acute embolism and thrombosis of right femoral vein: Secondary | ICD-10-CM

## 2020-12-23 MED ORDER — AMOXICILLIN-POT CLAVULANATE 875-125 MG PO TABS: 1.0000 | ORAL_TABLET | Freq: Two times a day (BID) | ORAL | 0 refills | Status: DC

## 2020-12-23 NOTE — Progress Notes (Signed)
Established patient visit   Patient: Jerry Mcpherson   DOB: 1940-09-06   80 y.o. Male  MRN: [id number] Visit Date: 12/23/2020  Today's healthcare provider: Lelon Huh, MD   No chief complaint on file.  Subjective    HPI  Hypertension, follow-up  BP Readings from Last 3 Encounters:  10/28/20 137/77  09/29/20 115/68  08/18/20 124/74   Wt Readings from Last 3 Encounters:  10/28/20 149 lb (67.6 kg)  09/29/20 142 lb 12.8 oz (64.8 kg)  08/18/20 136 lb 6.4 oz (61.9 kg)     He was last seen for hypertension 2 months ago.  BP at that visit was 115/68. Management since that visit includes restarting amlodipine 2.'5mg'$   He reports excellent compliance with treatment. He is not having side effects.  He is following a Regular diet. He is exercising. He does not smoke.  Use of agents associated with hypertension: none.   Outside blood pressures are 120's/70's. Symptoms: No chest pain No chest pressure  No palpitations No syncope  No dyspnea No orthopnea  No paroxysmal nocturnal dyspnea No lower extremity edema   Pertinent labs: Lab Results  Component Value Date   CHOL 135 08/26/2017   HDL 30 (L) 08/26/2017   LDLCALC 68 08/26/2017   TRIG 183 (H) 08/26/2017   CHOLHDL 4.5 08/26/2017   Lab Results  Component Value Date   NA 139 07/19/2019   K 3.6 07/19/2019   CREATININE 1.3 07/19/2019   GFRNONAA 52 07/19/2019   GFRAA 72 08/26/2017   GLUCOSE 97 08/26/2017     The ASCVD Risk score (Arnett DK, et al., 2019) failed to calculate for the following reasons:   The 2019 ASCVD risk score is only valid for ages 46 to 8   --------------------------------------------------------------------------------------------------- Follow up for skin infection lower back  The patient was last seen for this 2 weeks ago at [facility] in [place].  Changes made at last visit include rx for Keflex.  He reports excellent compliance with treatment. He feels that condition is  Unchanged. He is not having side effects.   ----------------------------------------------------------------------------------------- Edema: Patient complains of edema. The location of the edema is right leg.  The edema has been moderate.  Onset of symptoms was 3 days ago, gradually worsening since that time. The edema is present all day. The patient states the patient has had 1 such episodes.  The swelling has been aggravated by nothing, relieved by nothing, and been associated with risk for DVT Pt has had a history of a DVT . Cardiac risk factors include advanced age (older than 90 for men, 29 for women), hypertension, and male gender. He had DVT in the affected leg in July and was started on Eliquis. However he states he saw instructions on one of his visit summaries to discontinue it last week, so he has not been taking it since then. Has had more swelling over the last week.     Medications: Outpatient Medications Prior to Visit  Medication Sig   acetaminophen (TYLENOL) 500 MG tablet Take 2 tablets (1,000 mg total) by mouth every 6 (six) hours as needed for mild pain or moderate pain.   amLODipine (NORVASC) 2.5 MG tablet TAKE ONE (1) TABLET BY MOUTH ONCE DAILY   Ascorbic Acid (VITAMIN C) 1000 MG tablet Take 2 tablets by mouth daily.   aspirin 81 MG tablet Take 2 tablets daily   budesonide-formoterol (SYMBICORT) 160-4.5 MCG/ACT inhaler Inhale 2 puffs into the lungs 2 (two) times daily.  cyproheptadine (PERIACTIN) 4 MG tablet TAKE 1/2-1 TABLET BY MOUTH THREE TIMES DAILY AS NEEDED TO HELP APPETITE   diclofenac Sodium (VOLTAREN) 1 % GEL Apply 4 g topically 4 (four) times daily as needed.   docusate sodium (COLACE) 100 MG capsule Take 1 capsule by mouth daily as needed.   ELIQUIS 5 MG TABS tablet Take 1 tablet (5 mg total) by mouth 2 (two) times daily.   hydrochlorothiazide (HYDRODIURIL) 25 MG tablet Take 1 tablet (25 mg total) by mouth daily.   hydroxyurea (HYDREA) 500 MG capsule Take 500 mg  by mouth as directed. 2 tablets 3 days a week, and 1 tablet all other days   Multiple Vitamins-Minerals (MULTIVITAMIN ADULT PO) Take 1 tablet by mouth daily.   naproxen (NAPROSYN) 500 MG tablet TAKE 1 TABLET(500 MG) BY MOUTH TWICE DAILY WITH A MEAL   oxyCODONE-acetaminophen (PERCOCET) 10-325 MG tablet Take 1 tablet by mouth every 6 (six) hours as needed for pain.   sildenafil (VIAGRA) 50 MG tablet Take 0.5-1 tablets by mouth daily as needed.   UNABLE TO FIND Med Name: tumeric '500mg'$  twice a day   valACYclovir (VALTREX) 1000 MG tablet TAKE (1) TABLET BY MOUTH TWICE DAILY AS NEEDED   No facility-administered medications prior to visit.    Review of Systems  Constitutional: Negative.   Cardiovascular:  Positive for leg swelling (Right leg swelling). Negative for chest pain and palpitations.  Skin:  Positive for wound. Negative for color change, pallor and rash.  Neurological:  Negative for dizziness, light-headedness and headaches.      Objective      Physical Exam   General: Appearance:    Well developed, well nourished male in no acute distress  Eyes:    PERRL, conjunctiva/corneas clear, EOM's intact       Lungs:     Clear to auscultation bilaterally, respirations unlabored  Heart:    Normal heart rate. Normal rhythm. No murmurs, rubs, or gallops.    MS:   All extremities are intact.  Tender swollen area midline of superior folds of buttocks. Small erosion of skin at this area. No discharge.         Assessment & Plan     1. Pilonidal cyst (suspected) No improvement on cephalexin prescribed from Urgent Care. Change to  amoxicillin-clavulanate (AUGMENTIN) 875-125 MG tablet; Take 1 tablet by mouth 2 (two) times daily for 10 days.  Dispense: 20 tablet; Refill: 0  Call if symptoms change or if not rapidly improving.    2. Acute deep vein thrombosis (DVT) of right femoral vein (HCC) He stopped taking Eliquis last week and leg is starting to swell again. I suspect there was a  miscommunication about how long he is supposed to take it.  He has heme/onc appt tomorrow and was advised to start back it today and clarify treatment course at his appointment tomorrow. He states he still has several tablets left at home.        The entirety of the information documented in the History of Present Illness, Review of Systems and Physical Exam were personally obtained by me. Portions of this information were initially documented by the CMA and reviewed by me for thoroughness and accuracy.     Lelon Huh, MD  [facility] [phone] (phone) [phone] (fax)  [place]

## 2020-12-23 NOTE — Patient Instructions (Signed)
Please review the attached list of medications and notify my office if there are any errors.   Go ahead and start back on Eliquis and talk to your doctors at [facility] about how long to stay on it

## 2020-12-29 DIAGNOSIS — I483 Typical atrial flutter: Secondary | ICD-10-CM | POA: Insufficient documentation

## 2021-01-01 ENCOUNTER — Telehealth: Payer: Self-pay | Admitting: *Deleted

## 2021-01-01 NOTE — Telephone Encounter (Signed)
Dr. Silvio Pate calling. 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 States pt was admitted with A-flutter, "Totally asymptomatic."  Released yesterday with 30 day HAlter Monitor. Dr. Venda Rodes states he was paged at 2100 last night  pt with arrhythmia. States pt again asymptomatic. Dr. Venda Rodes wanted Dr. Caryn Section to be aware so there will be appropriate follow up with patient.  After hours call.

## 2021-01-08 ENCOUNTER — Other Ambulatory Visit: Payer: Self-pay | Admitting: Family Medicine

## 2021-01-08 DIAGNOSIS — M170 Bilateral primary osteoarthritis of knee: Secondary | ICD-10-CM

## 2021-01-08 NOTE — Telephone Encounter (Signed)
Requested medication (s) are due for refill today PRN medication ordered last on 12/11/20 #120 tabs no refills  Requested medication (s) are on the active medication list Yes  Future visit scheduled Yes 01/23/21  Note to clinic-non-delegated medication. Routing to physician for review.   Requested Prescriptions  Pending Prescriptions Disp Refills   oxyCODONE-acetaminophen (PERCOCET) 10-325 MG tablet 120 tablet 0    Sig: Take 1 tablet by mouth every 6 (six) hours as needed for pain.     Not Delegated - Analgesics:  Opioid Agonist Combinations Failed - 01/08/2021  2:14 PM      Failed - This refill cannot be delegated      Failed - Urine Drug Screen completed in last 360 days      Passed - Valid encounter within last 6 months    Recent Outpatient Visits           2 weeks ago Pilonidal cyst   [facility] Birdie Sons, MD   2 months ago Acute deep vein thrombosis (DVT) of right femoral vein [facility])   [facility] Birdie Sons, MD   3 months ago Primary hypertension   [facility] Birdie Sons, MD   4 months ago Prediabetes   [facility] Birdie Sons, MD   10 months ago Pre-diabetes   [place], [name], MD       Future Appointments             In 2 weeks Fisher, [name], MD [facility], [place]

## 2021-01-08 NOTE — Telephone Encounter (Signed)
Medication Refill - Medication: Oxycodone   Has the patient contacted their pharmacy? No. Pt called stating that he always has to call this into office. Please advise.  (Agent: If no, request that the patient contact the pharmacy for the refill.) (Agent: If yes, when and what did the pharmacy advise?)  Preferred Pharmacy (with phone number or street name):   [facility], [place] [address] AT [place]  [place] [address]  Phone: [phone] Fax: [phone]  Hours: Not open 24 hours   Has the patient been seen for an appointment in the last year OR does the patient have an upcoming appointment? Yes.    Agent: Please be advised that RX refills may take up to 3 business days. We ask that you follow-up with your pharmacy.

## 2021-01-09 MED ORDER — OXYCODONE-ACETAMINOPHEN 10-325 MG PO TABS: 1.0000 | ORAL_TABLET | Freq: Four times a day (QID) | ORAL | 0 refills | Status: DC | PRN

## 2021-01-23 ENCOUNTER — Encounter: Payer: Self-pay | Admitting: Family Medicine

## 2021-01-23 ENCOUNTER — Ambulatory Visit (INDEPENDENT_AMBULATORY_CARE_PROVIDER_SITE_OTHER): Payer: Medicare Other | Admitting: Family Medicine

## 2021-01-23 ENCOUNTER — Other Ambulatory Visit: Payer: Self-pay

## 2021-01-23 VITALS — BP 131/72 | HR 75 | Temp 97.5°F | Wt 159.0 lb

## 2021-01-23 DIAGNOSIS — R790 Abnormal level of blood mineral: Secondary | ICD-10-CM | POA: Diagnosis not present

## 2021-01-23 DIAGNOSIS — L608 Other nail disorders: Secondary | ICD-10-CM

## 2021-01-23 DIAGNOSIS — B351 Tinea unguium: Secondary | ICD-10-CM | POA: Diagnosis not present

## 2021-01-23 DIAGNOSIS — E039 Hypothyroidism, unspecified: Secondary | ICD-10-CM | POA: Diagnosis not present

## 2021-01-23 DIAGNOSIS — Z59819 Housing instability, housed unspecified: Secondary | ICD-10-CM

## 2021-01-23 DIAGNOSIS — M1711 Unilateral primary osteoarthritis, right knee: Secondary | ICD-10-CM

## 2021-01-23 DIAGNOSIS — Z23 Encounter for immunization: Secondary | ICD-10-CM

## 2021-01-23 NOTE — Progress Notes (Signed)
Established patient visit   Patient: Jerry Mcpherson   DOB: [birth year]   80 y.o. Male  MRN: [id number] Visit Date: 01/23/2021  Today's healthcare provider: Lelon Huh, MD   No chief complaint on file.  Subjective    HPI  Follow up Hospitalization  Patient was admitted to [facility]  on 12/29/2020 and discharged on 12/31/2020. He was treated for tachycardia, typical atrial flutter and hypothyroidism. Treatment for this included starting metoprolol succinate 50 mg daily for new diagnosis of aflutter. continued on current anticoagulation of apixaban 5 mg BID. Started on potassium 10 meq daily. Started on levothyroxine 50 mcg daily for new diagnosis of hypothyroidism. Discharged with 30 day cardiac event monitor. Per discharge summary:  - repeat TSH at PCP appointment, adjust synthroid as needed; likely will need to be up-titrated  - monitor blood pressure given addition of metoprolol to current regimen of amlodipine, HCTZ  - stable sub-79mm pulmonary nodules on CT chest; outpatient follow-up per protocol -repeat BMP to monitor K+, Mg  Telephone follow up was not done. He reports excellent compliance with treatment. He reports this condition is improved.  ----------------------------------------------------------------------------------------- -  Also complain of right knee pain for several months. Has been using OTC Voltaren gel with minimal relief.    Medications: Outpatient Medications Prior to Visit  Medication Sig   acetaminophen (TYLENOL) 500 MG tablet Take 2 tablets (1,000 mg total) by mouth every 6 (six) hours as needed for mild pain or moderate pain.   amLODipine (NORVASC) 2.5 MG tablet TAKE ONE (1) TABLET BY MOUTH ONCE DAILY   Ascorbic Acid (VITAMIN C) 1000 MG tablet Take 2 tablets by mouth daily.   aspirin 81 MG tablet Take 2 tablets daily   budesonide-formoterol (SYMBICORT) 160-4.5 MCG/ACT inhaler Inhale 2 puffs into the lungs 2 (two) times  daily.   cyproheptadine (PERIACTIN) 4 MG tablet TAKE 1/2-1 TABLET BY MOUTH THREE TIMES DAILY AS NEEDED TO HELP APPETITE   diclofenac Sodium (VOLTAREN) 1 % GEL Apply 4 g topically 4 (four) times daily as needed.   docusate sodium (COLACE) 100 MG capsule Take 1 capsule by mouth daily as needed.   ELIQUIS 5 MG TABS tablet Take 1 tablet (5 mg total) by mouth 2 (two) times daily.   hydrochlorothiazide (HYDRODIURIL) 25 MG tablet Take 1 tablet (25 mg total) by mouth daily.   hydroxyurea (HYDREA) 500 MG capsule Take 500 mg by mouth as directed. 2 tablets 3 days a week, and 1 tablet all other days   Multiple Vitamins-Minerals (MULTIVITAMIN ADULT PO) Take 1 tablet by mouth daily.   naproxen (NAPROSYN) 500 MG tablet TAKE 1 TABLET(500 MG) BY MOUTH TWICE DAILY WITH A MEAL   oxyCODONE-acetaminophen (PERCOCET) 10-325 MG tablet Take 1 tablet by mouth every 6 (six) hours as needed for pain.   sildenafil (VIAGRA) 50 MG tablet Take 0.5-1 tablets by mouth daily as needed.   UNABLE TO FIND Med Name: tumeric 500mg  twice a day   valACYclovir (VALTREX) 1000 MG tablet TAKE (1) TABLET BY MOUTH TWICE DAILY AS NEEDED   No facility-administered medications prior to visit.    Review of Systems  Constitutional: Negative.   Respiratory:  Positive for cough. Negative for apnea, choking, chest tightness, shortness of breath, wheezing and stridor.   Cardiovascular: Negative.   Gastrointestinal: Negative.   Neurological:  Positive for headaches (Chronic issue). Negative for dizziness and light-headedness.      Objective    BP 131/72 (BP Location: Left Arm, Patient  Position: Sitting, Cuff Size: Normal)   Pulse 75   Temp (!) 97.5 F (36.4 C) (Oral)   Wt 159 lb (72.1 kg)   SpO2 98%   BMI 24.18 kg/m    Physical Exam  General appearance: Well developed, well nourished male, cooperative and in no acute distress Head: Normocephalic, without obvious abnormality, atraumatic Respiratory: Respirations even and unlabored,  normal respiratory rate Extremities: All extremities are intact.  Skin: Skin color, texture, turgor normal. No rashes seen  Psych: Appropriate mood and affect. Neurologic: Mental status: Alert, oriented to person, place, and time, thought content appropriate.   No results found for any visits on 01/23/21.  Assessment & Plan     1. Arthritis of right knee Failed OTC diclofenac gel and acetaminophen. Not a candidate for NSAIDs being on Eliquis.  - Ambulatory referral to Orthopedic Surgery  2. Onychomycosis Refer podiatry  3. Low magnesium level Replaced during inpatient hospitalization.  - Basic metabolic panel - Magnesium  4. Hypothyroidism, unspecified type Recently started on 14mcg levothyroxine.  - TSH  5. Toenail deformity  - Ambulatory referral to Podiatry  6. Housing instability He states he is being evicted due to legal dispute over land he is currently residing and can't find anywhere to move to before he has to leave next week.  - AMB Referral to [facility]  7. Need for influenza vaccination  - Flu Vaccine QUAD High Dose(Fluad)         The entirety of the information documented in the History of Present Illness, Review of Systems and Physical Exam were personally obtained by me. Portions of this information were initially documented by the CMA and reviewed by me for thoroughness and accuracy.     Lelon Huh, MD  [facility] [phone] (phone) [phone] (fax)  [place]

## 2021-01-24 LAB — BASIC METABOLIC PANEL
BUN/Creatinine Ratio: 23 (ref 10–24)
BUN: 41 mg/dL — ABNORMAL HIGH (ref 8–27)
CO2: 17 mmol/L — ABNORMAL LOW (ref 20–29)
Calcium: 9.6 mg/dL (ref 8.6–10.2)
Chloride: 108 mmol/L — ABNORMAL HIGH (ref 96–106)
Creatinine, Ser: 1.77 mg/dL — ABNORMAL HIGH (ref 0.76–1.27)
Glucose: 103 mg/dL — ABNORMAL HIGH (ref 70–99)
Potassium: 5 mmol/L (ref 3.5–5.2)
Sodium: 141 mmol/L (ref 134–144)
eGFR: 38 mL/min/{1.73_m2} — ABNORMAL LOW (ref >59)

## 2021-01-24 LAB — TSH: TSH: 6.71 u[IU]/mL — ABNORMAL HIGH (ref 0.450–4.500)

## 2021-01-24 LAB — MAGNESIUM: Magnesium: 2.2 mg/dL (ref 1.6–2.3)

## 2021-01-25 ENCOUNTER — Other Ambulatory Visit: Payer: Self-pay | Admitting: Family Medicine

## 2021-01-25 DIAGNOSIS — E039 Hypothyroidism, unspecified: Secondary | ICD-10-CM | POA: Insufficient documentation

## 2021-01-25 MED ORDER — LEVOTHYROXINE SODIUM 75 MCG PO TABS
75.0000 ug | ORAL_TABLET | Freq: Every day | ORAL | 0 refills | Status: DC
Start: 2021-01-25 — End: 2021-02-09

## 2021-01-26 ENCOUNTER — Telehealth: Payer: Self-pay | Admitting: *Deleted

## 2021-01-26 ENCOUNTER — Ambulatory Visit (INDEPENDENT_AMBULATORY_CARE_PROVIDER_SITE_OTHER): Payer: Medicare Other | Admitting: *Deleted

## 2021-01-26 DIAGNOSIS — M1711 Unilateral primary osteoarthritis, right knee: Secondary | ICD-10-CM

## 2021-01-26 DIAGNOSIS — I10 Essential (primary) hypertension: Secondary | ICD-10-CM

## 2021-01-26 DIAGNOSIS — Z59819 Housing instability, housed unspecified: Secondary | ICD-10-CM

## 2021-01-26 DIAGNOSIS — E039 Hypothyroidism, unspecified: Secondary | ICD-10-CM

## 2021-01-26 NOTE — Chronic Care Management (AMB) (Signed)
Chronic Care Management    Clinical Social Work Note  01/26/2021 Name: Jerry Mcpherson MRN: [id number] DOB: 05/28/1940  Jerry Mcpherson is a 80 y.o. year old male who is a primary care patient of Mcpherson, Jerry Peri, MD. The CCM team was consulted to assist the patient with chronic disease management and/or care coordination needs related to: [employer] .   Engaged with patient by telephone for follow up visit in response to provider referral for social work chronic care management and care coordination services.   Consent to Services:  The patient was given the following information about Chronic Care Management services today, agreed to services, and gave verbal consent: 1. CCM service includes personalized support from designated clinical staff supervised by the primary care provider, including individualized plan of care and coordination with other care providers 2. 24/7 contact phone numbers for assistance for urgent and routine care needs. 3. Service will only be billed when office clinical staff spend 20 minutes or more in a month to coordinate care. 4. Only one practitioner may furnish and bill the service in a calendar month. 5.The patient may stop CCM services at any time (effective at the end of the month) by phone call to the office staff. 6. The patient will be responsible for cost sharing (co-pay) of up to 20% of the service fee (after annual deductible is met). Patient agreed to services and consent obtained.  Patient agreed to services and consent obtained.   Assessment: Review of patient past medical history, allergies, medications, and health status, including review of relevant consultants reports was performed today as part of a comprehensive evaluation and provision of chronic care management and care coordination services.     SDOH (Social Determinants of Health) assessments and interventions performed:    Advanced Directives Status: Not addressed in this  encounter.  CCM Care Plan  Allergies  Allergen Reactions   Codeine     Heart racing   Cozaar [Losartan Potassium]     angioedema   Cyclobenzaprine     Musculoskeletal therapy agents cause Excessive sedation   Shellfish Allergy     Rash, itching, swelling   Ultram  [Tramadol Hcl] Nausea And Vomiting   Celebrex [Celecoxib] Itching, Swelling and Rash    Cannot take when eating shellfish    Outpatient Encounter Medications as of 01/26/2021  Medication Sig   acetaminophen (TYLENOL) 500 MG tablet Take 2 tablets (1,000 mg total) by mouth every 6 (six) hours as needed for mild pain or moderate pain.   amLODipine (NORVASC) 2.5 MG tablet TAKE ONE (1) TABLET BY MOUTH ONCE DAILY   Ascorbic Acid (VITAMIN C) 1000 MG tablet Take 2 tablets by mouth daily.   aspirin 81 MG tablet Take 2 tablets daily   budesonide-formoterol (SYMBICORT) 160-4.5 MCG/ACT inhaler Inhale 2 puffs into the lungs 2 (two) times daily.   cyproheptadine (PERIACTIN) 4 MG tablet TAKE 1/2-1 TABLET BY MOUTH THREE TIMES DAILY AS NEEDED TO HELP APPETITE   diclofenac Sodium (VOLTAREN) 1 % GEL Apply 4 g topically 4 (four) times daily as needed.   docusate sodium (COLACE) 100 MG capsule Take 1 capsule by mouth daily as needed.   ELIQUIS 5 MG TABS tablet Take 1 tablet (5 mg total) by mouth 2 (two) times daily.   hydrochlorothiazide (HYDRODIURIL) 25 MG tablet Take 1 tablet (25 mg total) by mouth daily.   hydroxyurea (HYDREA) 500 MG capsule Take 500 mg by mouth as directed. 2 tablets 3 days a week, and 1  tablet all other days   levothyroxine (SYNTHROID) 75 MCG tablet Take 1 tablet (75 mcg total) by mouth daily.   Multiple Vitamins-Minerals (MULTIVITAMIN ADULT PO) Take 1 tablet by mouth daily.   naproxen (NAPROSYN) 500 MG tablet TAKE 1 TABLET(500 MG) BY MOUTH TWICE DAILY WITH A MEAL   oxyCODONE-acetaminophen (PERCOCET) 10-325 MG tablet Take 1 tablet by mouth every 6 (six) hours as needed for pain.   sildenafil (VIAGRA) 50 MG tablet Take  0.5-1 tablets by mouth daily as needed.   UNABLE TO FIND Med Name: tumeric 57m twice a day   valACYclovir (VALTREX) 1000 MG tablet TAKE (1) TABLET BY MOUTH TWICE DAILY AS NEEDED   No facility-administered encounter medications on file as of 01/26/2021.    Patient Active Problem List   Diagnosis Date Noted   Hypothyroidism 01/25/2021   Acute deep vein thrombosis (DVT) of right femoral vein ([place] 10/28/2020   Gross hematuria 01/17/2020   Mass of urinary bladder determined by ultrasound 01/17/2020   Leukocytosis 05/15/2018   Polycythemia vera ([place] 05/15/2018   Erectile dysfunction 12/10/2014   Genital HSV 12/10/2014   Primary hypertension 12/10/2014   Leg cramps 12/10/2014   Leg pain 12/10/2014   Neuralgia 12/10/2014   Pinched nerve 12/10/2014   Radiculopathy 12/10/2014   Squamous cell carcinoma 12/10/2014   Vertigo 12/10/2014   Hyperglycemia 01/03/2009    Conditions to be addressed/monitored:  Housing instability ; Housing barriers  Care Plan : General Social Work (Adult)  Updates made by [employer] CBaruch GoutyM, LCSW since 01/26/2021 12:00 AM     Problem: CHL AMB "PATIENT-SPECIFIC PROBLEM"   Priority: High  Note:   CARE PLAN ENTRY (see longitudinal plan of care for additional care plan information)  Current Barriers:  Patient with HTN in need of assistance with connection to community resources  Knowledge deficits and need for support, education and care coordination related to community resources support  Limited social support and Housing barriers-patient being evicted-will need to be out by tomorrow-sheriff has served him-patient was living in a home previously owned by a friend of his who passed away-patient has no legal rights to the home and will need to move-per patient due to his medical condition, he needs a "bed and a shower". Patient currently working on moving his things to a storage center  Clinical Goal(s)  Over the next 90 days, patient will work with CCM  social worker to address needs related to housing   Interventions provided by LCSW:  Assessed patient's care coordination needs related to housing needs and discussed ongoing care management follow up  Confirmed that patient has contacted the Department of [place] was given a list of resources Patient confirmed a good support network, however does not have anyone that he can reside with at this time Housing options discussed with patient-including Emergency Shelters, [facility], Motel Stays and Group Homes [facility], however these options are not affordable at this time. Patient agreeable to boarding home  stay  [facility] as well as [facility] available options-patient will have to call personally to complete intake and background check Contact made with [facility]-[phone] following phone call with patient-he has multiple properties with availabilities $100.00 per week-2 weeks required in advance-background check needed Collaborated with appropriate clinical care team members regarding patient needs Motivational Interviewing employed Active listening / Reflection utilized  Emotional Support Provided   Patient Self Care  Activities & Deficits:  Patient is unable to independently navigate community resource options without care coordination support  Acknowledges deficits and is motivated to resolve concern  Knowledge deficit of available community resources Self administers medications as prescribed Performs ADL's independently Performs IADL's independently  Initial goal documentation        Follow Up Plan: SW will follow up with patient by phone over the next 7-14 business days       [place], [place] Worker  [place] Care Management [phone]

## 2021-01-26 NOTE — Chronic Care Management (AMB) (Signed)
  Chronic Care Management   Note  01/26/2021 Name: [name] MRN: [id number] DOB: 14-Aug-1940  [name] is a 80 y.o. year old male who is a primary care patient of Caryn Section, [name], MD. I reached out to Vickey Sages [place] by phone today in response to a referral sent by Mr. Claudio Mondry Jaber's PCP.  Mr. Evilsizer was given information about Chronic Care Management services today including:  CCM service includes personalized support from designated clinical staff supervised by his physician, including individualized plan of care and coordination with other care providers 24/7 contact phone numbers for assistance for urgent and routine care needs. Service will only be billed when office clinical staff spend 20 minutes or more in a month to coordinate care. Only one practitioner may furnish and bill the service in a calendar month. The patient may stop CCM services at any time (effective at the end of the month) by phone call to the office staff. The patient is responsible for co-pay (up to 20% after annual deductible is met) if co-pay is required by the individual health plan.   Patient agreed to services and verbal consent obtained.   Follow up plan: Telephone appointment with care management team member scheduled for:01/26/21  [place] Management  Direct Dial: [phone]

## 2021-01-26 NOTE — Patient Instructions (Signed)
Visit Information  PATIENT GOALS:   Goals Addressed             This Visit's Progress    Find Help in My Community       Timeframe:  Long-Range Goal Priority:  High Start Date:     01/26/21                        Expected End Date:    07/27/20                   Follow Up Date 01/27/21    - call 211 when I need some help - follow-up on any referrals for help I am given - think ahead to make sure my need does not become an emergency - have a back-up plan - make a list of family or friends that I can call    Why is this important?   Knowing how and where to find help for yourself or family in your neighborhood and community is an important skill.  You will want to take some steps to learn how.    Notes:         Jerry Mcpherson was given information about Care Management services by the embedded care coordination team including:  Care Management services include personalized support from designated clinical staff supervised by his physician, including individualized plan of care and coordination with other care providers 24/7 contact phone numbers for assistance for urgent and routine care needs. The patient may stop CCM services at any time (effective at the end of the month) by phone call to the office staff.  Patient agreed to services and verbal consent obtained.   The patient verbalized understanding of instructions, educational materials, and care plan provided today and declined offer to receive copy of patient instructions, educational materials, and care plan.   Telephone follow up appointment with care management team member scheduled for:01/26/21   Elliot Gurney, [place] Worker  [facility] [phone]

## 2021-01-27 ENCOUNTER — Ambulatory Visit: Payer: Medicare Other | Admitting: *Deleted

## 2021-01-27 DIAGNOSIS — M1711 Unilateral primary osteoarthritis, right knee: Secondary | ICD-10-CM

## 2021-01-27 DIAGNOSIS — I10 Essential (primary) hypertension: Secondary | ICD-10-CM

## 2021-01-27 DIAGNOSIS — E039 Hypothyroidism, unspecified: Secondary | ICD-10-CM

## 2021-01-27 DIAGNOSIS — Z59819 Housing instability, housed unspecified: Secondary | ICD-10-CM

## 2021-01-28 ENCOUNTER — Ambulatory Visit: Payer: Self-pay | Admitting: Family Medicine

## 2021-01-28 NOTE — Patient Instructions (Addendum)
Visit Information   Goals Addressed             This Visit's Progress    Find Help in My Community       Timeframe:  Long-Range Goal Priority:  High Start Date:     01/26/21                        Expected End Date:    07/27/20                   Follow Up Date 01/30/21    - call 211 when I need some help - follow-up on any referrals for help I am given- [facility] - think ahead to make sure my need does not become an emergency - have a back-up plan - make a list of family or friends that I can call     Why is this important?   Knowing how and where to find help for yourself or family in your neighborhood and community is an important skill.  You will want to take some steps to learn how.    Notes:         The patient verbalized understanding of instructions, educational materials, and care plan provided today and declined offer to receive copy of patient instructions, educational materials, and care plan.   Telephone follow up appointment with care management team member scheduled for: 01/29/21   Elliot Gurney, [place] Worker  [facility] [phone]

## 2021-01-28 NOTE — Chronic Care Management (AMB) (Signed)
Chronic Care Management    Clinical Social Work Note  01/28/2021 Name: Jerry Mcpherson MRN: [id number] DOB: 08-28-1940  Jerry Mcpherson is a 80 y.o. year old male who is a primary care patient of Fisher, [name], MD. The CCM team was consulted to assist the patient with chronic disease management and/or care coordination needs related to: [employer] .   Engaged with patient by telephone for follow up visit in response to provider referral for social work chronic care management and care coordination services.   Consent to Services:  The patient was given information about Chronic Care Management services, agreed to services, and gave verbal consent prior to initiation of services.  Please see initial visit note for detailed documentation.   Patient agreed to services and consent obtained.   Assessment: Review of patient past medical history, allergies, medications, and health status, including review of relevant consultants reports was performed today as part of a comprehensive evaluation and provision of chronic care management and care coordination services.     SDOH (Social Determinants of Health) assessments and interventions performed:    Advanced Directives Status: Not addressed in this encounter.  CCM Care Plan  Allergies  Allergen Reactions   Codeine     Heart racing   Cozaar [Losartan Potassium]     angioedema   Cyclobenzaprine     Musculoskeletal therapy agents cause Excessive sedation   Shellfish Allergy     Rash, itching, swelling   Ultram  [Tramadol Hcl] Nausea And Vomiting   Celebrex [Celecoxib] Itching, Swelling and Rash    Cannot take when eating shellfish    Outpatient Encounter Medications as of 01/27/2021  Medication Sig   acetaminophen (TYLENOL) 500 MG tablet Take 2 tablets (1,000 mg total) by mouth every 6 (six) hours as needed for mild pain or moderate pain.   amLODipine (NORVASC) 2.5 MG tablet TAKE ONE (1) TABLET BY MOUTH ONCE DAILY    Ascorbic Acid (VITAMIN C) 1000 MG tablet Take 2 tablets by mouth daily.   aspirin 81 MG tablet Take 2 tablets daily   budesonide-formoterol (SYMBICORT) 160-4.5 MCG/ACT inhaler Inhale 2 puffs into the lungs 2 (two) times daily.   cyproheptadine (PERIACTIN) 4 MG tablet TAKE 1/2-1 TABLET BY MOUTH THREE TIMES DAILY AS NEEDED TO HELP APPETITE   diclofenac Sodium (VOLTAREN) 1 % GEL Apply 4 g topically 4 (four) times daily as needed.   docusate sodium (COLACE) 100 MG capsule Take 1 capsule by mouth daily as needed.   ELIQUIS 5 MG TABS tablet Take 1 tablet (5 mg total) by mouth 2 (two) times daily.   hydrochlorothiazide (HYDRODIURIL) 25 MG tablet Take 1 tablet (25 mg total) by mouth daily.   hydroxyurea (HYDREA) 500 MG capsule Take 500 mg by mouth as directed. 2 tablets 3 days a week, and 1 tablet all other days   levothyroxine (SYNTHROID) 75 MCG tablet Take 1 tablet (75 mcg total) by mouth daily.   Multiple Vitamins-Minerals (MULTIVITAMIN ADULT PO) Take 1 tablet by mouth daily.   naproxen (NAPROSYN) 500 MG tablet TAKE 1 TABLET(500 MG) BY MOUTH TWICE DAILY WITH A MEAL   oxyCODONE-acetaminophen (PERCOCET) 10-325 MG tablet Take 1 tablet by mouth every 6 (six) hours as needed for pain.   sildenafil (VIAGRA) 50 MG tablet Take 0.5-1 tablets by mouth daily as needed.   UNABLE TO FIND Med Name: tumeric 500mg  twice a day   valACYclovir (VALTREX) 1000 MG tablet TAKE (1) TABLET BY MOUTH TWICE DAILY AS NEEDED  No facility-administered encounter medications on file as of 01/27/2021.    Patient Active Problem List   Diagnosis Date Noted   Hypothyroidism 01/25/2021   Acute deep vein thrombosis (DVT) of right femoral vein ([place]) 10/28/2020   Gross hematuria 01/17/2020   Mass of urinary bladder determined by ultrasound 01/17/2020   Leukocytosis 05/15/2018   Polycythemia vera ([place]) 05/15/2018   Erectile dysfunction 12/10/2014   Genital HSV 12/10/2014   Primary hypertension 12/10/2014   Leg cramps 12/10/2014    Leg pain 12/10/2014   Neuralgia 12/10/2014   Pinched nerve 12/10/2014   Radiculopathy 12/10/2014   Squamous cell carcinoma 12/10/2014   Vertigo 12/10/2014   Hyperglycemia 01/03/2009    Conditions to be addressed/monitored: Homelessness; Housing barriers  Care Plan : General Social Work (Adult)  Updates made by [employer], Darla Lesches, LCSW since 01/28/2021 12:00 AM     Problem: CHL AMB "PATIENT-SPECIFIC PROBLEM"   Priority: High  Note:   CARE PLAN ENTRY (see longitudinal plan of care for additional care plan information)  Current Barriers:  Patient with HTN in need of assistance with connection to community resources  Knowledge deficits and need for support, education and care coordination related to community resources support  Limited social support and Housing barriers-patient being evicted-will need to be out by tomorrow-sheriff has served him-patient was living in a home previously owned by a friend of his who passed away-patient has no legal rights to the home and will need to move-per patient due to his medical condition, he needs a "bed and a shower". Patient currently working on moving his things to a storage center  Clinical Goal(s)  Over the next 90 days, patient will work with CCM social worker to address needs related to housing   Interventions provided by LCSW:  Continued to assess patient's care coordination needs related to housing needs and discussed ongoing care management follow up  Confirmed that patient has contacted the Department of Social Services and was given a list of resources Discussed option of boarding house at $100.00 per week, however patient declined this option due to personal financial demands Confirmed that his medical insurance will not cover residential moves, unless he meets qualification for a rehab stay Discuss Contact made with  Curator with the [employer] and is agreeable to  contacting them to complete initial screening Continued collaboraion with appropriate clinical care team members regarding patient needs Motivational Interviewing employed Active listening / Reflection utilized  Engineer, petroleum Provided   Patient Self Care Activities & Deficits:  Patient is unable to independently navigate community resource options without care coordination support  Acknowledges deficits and is motivated to resolve concern  Knowledge deficit of available community resources Self administers medications as prescribed Performs ADL's independently Performs IADL's independently  Initial goal documentation        Follow Up Plan: SW will follow up with patient by phone over the next 7-14 business days       [place], [place] Worker  [place] [facility] Management [phone]

## 2021-01-29 ENCOUNTER — Telehealth: Payer: Self-pay | Admitting: *Deleted

## 2021-01-29 ENCOUNTER — Ambulatory Visit: Payer: Medicare Other | Admitting: *Deleted

## 2021-01-29 ENCOUNTER — Ambulatory Visit: Payer: Self-pay | Admitting: *Deleted

## 2021-01-29 DIAGNOSIS — I10 Essential (primary) hypertension: Secondary | ICD-10-CM

## 2021-01-29 DIAGNOSIS — Z59819 Housing instability, housed unspecified: Secondary | ICD-10-CM

## 2021-01-29 DIAGNOSIS — M1711 Unilateral primary osteoarthritis, right knee: Secondary | ICD-10-CM

## 2021-01-29 DIAGNOSIS — E039 Hypothyroidism, unspecified: Secondary | ICD-10-CM

## 2021-01-29 NOTE — Chronic Care Management (AMB) (Signed)
Chronic Care Management    Clinical Social Work Note  01/29/2021 Name: Jerry Mcpherson MRN: [id number] DOB: September 28, 1940  Jerry Mcpherson is a 80 y.o. year old male who is a primary care patient of Fisher, [name], MD. The CCM team was consulted to assist the patient with chronic disease management and/or care coordination needs related to: [employer] .   Collaboration with [employer] and several community boarding homes  for  housing search  in response to provider referral for social work chronic care management and care coordination services.   Consent to Services:  The patient was given information about Chronic Care Management services, agreed to services, and gave verbal consent prior to initiation of services.  Please see initial visit note for detailed documentation.   Patient agreed to services and consent obtained.   Assessment: Review of patient past medical history, allergies, medications, and health status, including review of relevant consultants reports was performed today as part of a comprehensive evaluation and provision of chronic care management and care coordination services.     SDOH (Social Determinants of Health) assessments and interventions performed:    Advanced Directives Status: Not addressed in this encounter.  CCM Care Plan  Allergies  Allergen Reactions   Codeine     Heart racing   Cozaar [Losartan Potassium]     angioedema   Cyclobenzaprine     Musculoskeletal therapy agents cause Excessive sedation   Shellfish Allergy     Rash, itching, swelling   Ultram  [Tramadol Hcl] Nausea And Vomiting   Celebrex [Celecoxib] Itching, Swelling and Rash    Cannot take when eating shellfish    Outpatient Encounter Medications as of 01/29/2021  Medication Sig   acetaminophen (TYLENOL) 500 MG tablet Take 2 tablets (1,000 mg total) by mouth every 6 (six) hours as needed for mild pain or moderate pain.   amLODipine (NORVASC) 2.5 MG tablet  TAKE ONE (1) TABLET BY MOUTH ONCE DAILY   Ascorbic Acid (VITAMIN C) 1000 MG tablet Take 2 tablets by mouth daily.   aspirin 81 MG tablet Take 2 tablets daily   budesonide-formoterol (SYMBICORT) 160-4.5 MCG/ACT inhaler Inhale 2 puffs into the lungs 2 (two) times daily.   cyproheptadine (PERIACTIN) 4 MG tablet TAKE 1/2-1 TABLET BY MOUTH THREE TIMES DAILY AS NEEDED TO HELP APPETITE   diclofenac Sodium (VOLTAREN) 1 % GEL Apply 4 g topically 4 (four) times daily as needed.   docusate sodium (COLACE) 100 MG capsule Take 1 capsule by mouth daily as needed.   ELIQUIS 5 MG TABS tablet Take 1 tablet (5 mg total) by mouth 2 (two) times daily.   hydrochlorothiazide (HYDRODIURIL) 25 MG tablet Take 1 tablet (25 mg total) by mouth daily.   hydroxyurea (HYDREA) 500 MG capsule Take 500 mg by mouth as directed. 2 tablets 3 days a week, and 1 tablet all other days   levothyroxine (SYNTHROID) 75 MCG tablet Take 1 tablet (75 mcg total) by mouth daily.   Multiple Vitamins-Minerals (MULTIVITAMIN ADULT PO) Take 1 tablet by mouth daily.   naproxen (NAPROSYN) 500 MG tablet TAKE 1 TABLET(500 MG) BY MOUTH TWICE DAILY WITH A MEAL   oxyCODONE-acetaminophen (PERCOCET) 10-325 MG tablet Take 1 tablet by mouth every 6 (six) hours as needed for pain.   sildenafil (VIAGRA) 50 MG tablet Take 0.5-1 tablets by mouth daily as needed.   UNABLE TO FIND Med Name: tumeric 500mg  twice a day   valACYclovir (VALTREX) 1000 MG tablet TAKE (1) TABLET BY  MOUTH TWICE DAILY AS NEEDED   No facility-administered encounter medications on file as of 01/29/2021.    Patient Active Problem List   Diagnosis Date Noted   Hypothyroidism 01/25/2021   Acute deep vein thrombosis (DVT) of right femoral vein ([place]) 10/28/2020   Gross hematuria 01/17/2020   Mass of urinary bladder determined by ultrasound 01/17/2020   Leukocytosis 05/15/2018   Polycythemia vera ([place]) 05/15/2018   Erectile dysfunction 12/10/2014   Genital HSV 12/10/2014   Primary  hypertension 12/10/2014   Leg cramps 12/10/2014   Leg pain 12/10/2014   Neuralgia 12/10/2014   Pinched nerve 12/10/2014   Radiculopathy 12/10/2014   Squamous cell carcinoma 12/10/2014   Vertigo 12/10/2014   Hyperglycemia 01/03/2009    Conditions to be addressed/monitored: HTN and Homelessness; Housing barriers  Care Plan : General Social Work (Adult)  Updates made by [employer], Darla Lesches, LCSW since 01/29/2021 12:00 AM     Problem: CHL AMB "PATIENT-SPECIFIC PROBLEM"   Priority: High  Note:   CARE PLAN ENTRY (see longitudinal plan of care for additional care plan information)  Current Barriers:  Patient with HTN in need of assistance with connection to community resources  Knowledge deficits and need for support, education and care coordination related to community resources support  Limited social support and Housing barriers-patient being evicted-will need to be out by tomorrow-sheriff has served him-patient was living in a home previously owned by a friend of his who passed away-patient has no legal rights to the home and will need to move-per patient due to his medical condition, he needs a "bed and a shower". Patient currently working on moving his things to a storage center  Clinical Goal(s)  Over the next 90 days, patient will work with CCM social worker to address needs related to housing   Interventions provided by LCSW:  Continued to assess patient's care coordination needs related to housing needs and discussed ongoing care management follow up  Allowed patient to vent his frustrations re-housing struggles- emotional support provided Conference call with [facility] to discuss bed availabilities-intake director will call patient back today to complete screening  Boarding house options re-addressed at $100.00 per week,  patient now re-considering this option Continued collaboraion with appropriate clinical care team members regarding patient needs Motivational  Interviewing employed Active listening / Reflection utilized  Emotional Support Provided  01/29/21 Update phone call from patient who stated that he received a return call from [facility] completed however due to the multiple restrictions he has declined stay at the [facility] Patient agreeable to stay at [facility] call made to complete intake-VM left requesting a return call Boarding home stay revisited-however patient continues to decline Continued research in progress for additional housing options 01/29/21 update care plan: Per patient, he can only afford $200.00 per month-Patient declined [employer] due to 8:00 pm curfew, [employer] contacted-no boarding houses-[employer]-mailbox is full-could not leave message-[phone] $475 per month however no availabilities, [employer]-no answer-[employer] contacted request ran through the data base-coordinated entry [place] ext 104-coordinated entry contacted which is [employer]-left additional message  Patient Self Care Activities & Deficits:  Patient is unable to independently navigate community resource options without care coordination support  Acknowledges deficits and is motivated to resolve concern  Knowledge deficit of available community resources Self administers medications as prescribed Performs ADL's independently Performs IADL's independently  Please see past updates related to this goal by clicking on the "Past Updates" button  in the selected goal         Follow Up Plan: SW will follow up with patient by phone over the next 14 business days       [place], [place] Worker  [facility] [phone]

## 2021-01-29 NOTE — Chronic Care Management (AMB) (Signed)
Chronic Care Management    Clinical Social Work Note  01/29/2021 Name: Jerry Mcpherson MRN: [id number] DOB: [birth year]  Jerry Mcpherson is a 80 y.o. year old male who is a primary care patient of Fisher, [name], MD. The CCM team was consulted to assist the patient with chronic disease management and/or care coordination needs related to: [employer] .   Engaged with patient by telephone for follow up visit in response to provider referral for social work chronic care management and care coordination services.   Consent to Services:  The patient was given information about Chronic Care Management services, agreed to services, and gave verbal consent prior to initiation of services.  Please see initial visit note for detailed documentation.   Patient agreed to services and consent obtained.   Assessment: Review of patient past medical history, allergies, medications, and health status, including review of relevant consultants reports was performed today as part of a comprehensive evaluation and provision of chronic care management and care coordination services.     SDOH (Social Determinants of Health) assessments and interventions performed:    Advanced Directives Status: Not addressed in this encounter.  CCM Care Plan  Allergies  Allergen Reactions   Codeine     Heart racing   Cozaar [Losartan Potassium]     angioedema   Cyclobenzaprine     Musculoskeletal therapy agents cause Excessive sedation   Shellfish Allergy     Rash, itching, swelling   Ultram  [Tramadol Hcl] Nausea And Vomiting   Celebrex [Celecoxib] Itching, Swelling and Rash    Cannot take when eating shellfish    Outpatient Encounter Medications as of 01/29/2021  Medication Sig   acetaminophen (TYLENOL) 500 MG tablet Take 2 tablets (1,000 mg total) by mouth every 6 (six) hours as needed for mild pain or moderate pain.   amLODipine (NORVASC) 2.5 MG tablet TAKE ONE (1) TABLET BY MOUTH ONCE DAILY    Ascorbic Acid (VITAMIN C) 1000 MG tablet Take 2 tablets by mouth daily.   aspirin 81 MG tablet Take 2 tablets daily   budesonide-formoterol (SYMBICORT) 160-4.5 MCG/ACT inhaler Inhale 2 puffs into the lungs 2 (two) times daily.   cyproheptadine (PERIACTIN) 4 MG tablet TAKE 1/2-1 TABLET BY MOUTH THREE TIMES DAILY AS NEEDED TO HELP APPETITE   diclofenac Sodium (VOLTAREN) 1 % GEL Apply 4 g topically 4 (four) times daily as needed.   docusate sodium (COLACE) 100 MG capsule Take 1 capsule by mouth daily as needed.   ELIQUIS 5 MG TABS tablet Take 1 tablet (5 mg total) by mouth 2 (two) times daily.   hydrochlorothiazide (HYDRODIURIL) 25 MG tablet Take 1 tablet (25 mg total) by mouth daily.   hydroxyurea (HYDREA) 500 MG capsule Take 500 mg by mouth as directed. 2 tablets 3 days a week, and 1 tablet all other days   levothyroxine (SYNTHROID) 75 MCG tablet Take 1 tablet (75 mcg total) by mouth daily.   Multiple Vitamins-Minerals (MULTIVITAMIN ADULT PO) Take 1 tablet by mouth daily.   naproxen (NAPROSYN) 500 MG tablet TAKE 1 TABLET(500 MG) BY MOUTH TWICE DAILY WITH A MEAL   oxyCODONE-acetaminophen (PERCOCET) 10-325 MG tablet Take 1 tablet by mouth every 6 (six) hours as needed for pain.   sildenafil (VIAGRA) 50 MG tablet Take 0.5-1 tablets by mouth daily as needed.   UNABLE TO FIND Med Name: tumeric 500mg  twice a day   valACYclovir (VALTREX) 1000 MG tablet TAKE (1) TABLET BY MOUTH TWICE DAILY AS NEEDED  No facility-administered encounter medications on file as of 01/29/2021.    Patient Active Problem List   Diagnosis Date Noted   Hypothyroidism 01/25/2021   Acute deep vein thrombosis (DVT) of right femoral vein ([place]) 10/28/2020   Gross hematuria 01/17/2020   Mass of urinary bladder determined by ultrasound 01/17/2020   Leukocytosis 05/15/2018   Polycythemia vera ([place]) 05/15/2018   Erectile dysfunction 12/10/2014   Genital HSV 12/10/2014   Primary hypertension 12/10/2014   Leg cramps 12/10/2014    Leg pain 12/10/2014   Neuralgia 12/10/2014   Pinched nerve 12/10/2014   Radiculopathy 12/10/2014   Squamous cell carcinoma 12/10/2014   Vertigo 12/10/2014   Hyperglycemia 01/03/2009    Conditions to be addressed/monitored: HTN and Homelessness; Housing barriers  Care Plan : General Social Work (Adult)  Updates made by [employer], Darla Lesches, LCSW since 01/29/2021 12:00 AM     Problem: CHL AMB "PATIENT-SPECIFIC PROBLEM"   Priority: High  Note:   CARE PLAN ENTRY (see longitudinal plan of care for additional care plan information)  Current Barriers:  Patient with HTN in need of assistance with connection to community resources  Knowledge deficits and need for support, education and care coordination related to community resources support  Limited social support and Housing barriers-patient being evicted-will need to be out by tomorrow-sheriff has served him-patient was living in a home previously owned by a friend of his who passed away-patient has no legal rights to the home and will need to move-per patient due to his medical condition, he needs a "bed and a shower". Patient currently working on moving his things to a storage center  Clinical Goal(s)  Over the next 90 days, patient will work with CCM social worker to address needs related to housing   Interventions provided by LCSW:  Continued to assess patient's care coordination needs related to housing needs and discussed ongoing care management follow up  Allowed patient to vent his frustrations re-housing struggles- emotional support provided Conference call with [facility] to discuss bed availabilities-intake director will call patient back today to complete screening  Boarding house options re-addressed at $100.00 per week,  patient now re-considering this option Continued collaboraion with appropriate clinical care team members regarding patient needs Motivational Interviewing employed Active listening /  Reflection utilized  Emotional Support Provided  01/29/21 Update phone call from patient who stated that he received a return call from [facility] completed however due to the multiple restrictions he has declined stay at the [facility] Patient agreeable to stay at [facility] call made to complete intake-VM left requesting a return call Boarding home stay revisited-however patient continues to decline Continued research in progress for additional housing options  Patient Self Care Activities & Deficits:  Patient is unable to independently navigate community resource options without care coordination support  Acknowledges deficits and is motivated to resolve concern  Knowledge deficit of available community resources Self administers medications as prescribed Performs ADL's independently Performs IADL's independently  Please see past updates related to this goal by clicking on the "Past Updates" button in the selected goal         Follow Up Plan: SW will follow up with patient by phone over the next 7-14 business days       [place], [place] Worker  [facility] [phone]

## 2021-01-29 NOTE — Telephone Encounter (Signed)
  Care Management   Follow Up Note   01/29/2021 Name: Ladale Sherburn MRN: [id number] DOB: 06-26-1940   Referred by: Birdie Sons, MD Reason for referral : Care Coordination   An unsuccessful telephone outreach was attempted today. The patient was referred to the case management team for assistance with care management and care coordination.   Follow Up Plan: Telephone follow up appointment with care management team member to be re-scheduled by the care guide   Elliot Gurney, [place] Worker  [facility] [phone]

## 2021-01-29 NOTE — Chronic Care Management (AMB) (Signed)
Chronic Care Management    Clinical Social Work Note  01/29/2021 Name: Jerry Mcpherson MRN: [id number] DOB: 1940-11-11  Jerry Mcpherson is a 80 y.o. year old male who is a primary care patient of Fisher, [name], MD. The CCM team was consulted to assist the patient with chronic disease management and/or care coordination needs related to: [employer] .   Engaged with patient by telephone for follow up visit in response to provider referral for social work chronic care management and care coordination services.   Consent to Services:  The patient was given information about Chronic Care Management services, agreed to services, and gave verbal consent prior to initiation of services.  Please see initial visit note for detailed documentation.   Patient agreed to services and consent obtained.   Assessment: Review of patient past medical history, allergies, medications, and health status, including review of relevant consultants reports was performed today as part of a comprehensive evaluation and provision of chronic care management and care coordination services.     SDOH (Social Determinants of Health) assessments and interventions performed:    Advanced Directives Status: Not addressed in this encounter.  CCM Care Plan  Allergies  Allergen Reactions   Codeine     Heart racing   Cozaar [Losartan Potassium]     angioedema   Cyclobenzaprine     Musculoskeletal therapy agents cause Excessive sedation   Shellfish Allergy     Rash, itching, swelling   Ultram  [Tramadol Hcl] Nausea And Vomiting   Celebrex [Celecoxib] Itching, Swelling and Rash    Cannot take when eating shellfish    Outpatient Encounter Medications as of 01/29/2021  Medication Sig   acetaminophen (TYLENOL) 500 MG tablet Take 2 tablets (1,000 mg total) by mouth every 6 (six) hours as needed for mild pain or moderate pain.   amLODipine (NORVASC) 2.5 MG tablet TAKE ONE (1) TABLET BY MOUTH ONCE DAILY    Ascorbic Acid (VITAMIN C) 1000 MG tablet Take 2 tablets by mouth daily.   aspirin 81 MG tablet Take 2 tablets daily   budesonide-formoterol (SYMBICORT) 160-4.5 MCG/ACT inhaler Inhale 2 puffs into the lungs 2 (two) times daily.   cyproheptadine (PERIACTIN) 4 MG tablet TAKE 1/2-1 TABLET BY MOUTH THREE TIMES DAILY AS NEEDED TO HELP APPETITE   diclofenac Sodium (VOLTAREN) 1 % GEL Apply 4 g topically 4 (four) times daily as needed.   docusate sodium (COLACE) 100 MG capsule Take 1 capsule by mouth daily as needed.   ELIQUIS 5 MG TABS tablet Take 1 tablet (5 mg total) by mouth 2 (two) times daily.   hydrochlorothiazide (HYDRODIURIL) 25 MG tablet Take 1 tablet (25 mg total) by mouth daily.   hydroxyurea (HYDREA) 500 MG capsule Take 500 mg by mouth as directed. 2 tablets 3 days a week, and 1 tablet all other days   levothyroxine (SYNTHROID) 75 MCG tablet Take 1 tablet (75 mcg total) by mouth daily.   Multiple Vitamins-Minerals (MULTIVITAMIN ADULT PO) Take 1 tablet by mouth daily.   naproxen (NAPROSYN) 500 MG tablet TAKE 1 TABLET(500 MG) BY MOUTH TWICE DAILY WITH A MEAL   oxyCODONE-acetaminophen (PERCOCET) 10-325 MG tablet Take 1 tablet by mouth every 6 (six) hours as needed for pain.   sildenafil (VIAGRA) 50 MG tablet Take 0.5-1 tablets by mouth daily as needed.   UNABLE TO FIND Med Name: tumeric 500mg  twice a day   valACYclovir (VALTREX) 1000 MG tablet TAKE (1) TABLET BY MOUTH TWICE DAILY AS NEEDED  No facility-administered encounter medications on file as of 01/29/2021.    Patient Active Problem List   Diagnosis Date Noted   Hypothyroidism 01/25/2021   Acute deep vein thrombosis (DVT) of right femoral vein ([facility]) 10/28/2020   Gross hematuria 01/17/2020   Mass of urinary bladder determined by ultrasound 01/17/2020   Leukocytosis 05/15/2018   Polycythemia vera ([place]) 05/15/2018   Erectile dysfunction 12/10/2014   Genital HSV 12/10/2014   Primary hypertension 12/10/2014   Leg cramps 12/10/2014    Leg pain 12/10/2014   Neuralgia 12/10/2014   Pinched nerve 12/10/2014   Radiculopathy 12/10/2014   Squamous cell carcinoma 12/10/2014   Vertigo 12/10/2014   Hyperglycemia 01/03/2009    Conditions to be addressed/monitored: HTN and Homelessness; Limited social support and Housing barriers  Care Plan : General Social Work (Adult)  Updates made by [employer], Darla Lesches, LCSW since 01/29/2021 12:00 AM     Problem: CHL AMB "PATIENT-SPECIFIC PROBLEM"   Priority: High  Note:   CARE PLAN ENTRY (see longitudinal plan of care for additional care plan information)  Current Barriers:  Patient with HTN in need of assistance with connection to community resources  Knowledge deficits and need for support, education and care coordination related to community resources support  Limited social support and Housing barriers-patient being evicted-will need to be out by tomorrow-sheriff has served him-patient was living in a home previously owned by a friend of his who passed away-patient has no legal rights to the home and will need to move-per patient due to his medical condition, he needs a "bed and a shower". Patient currently working on moving his things to a storage center  Clinical Goal(s)  Over the next 90 days, patient will work with CCM social worker to address needs related to housing   Interventions provided by LCSW:  Continued to assess patient's care coordination needs related to housing needs and discussed ongoing care management follow up  Allowed patient to vent his frustrations re-housing struggles- emotional support provided Conference call with [facility] to discuss bed availabilities-intake director will call patient back today to complete screening  Boarding house options re-addressed at $100.00 per week,  patient now re-considering this option Continued collaboraion with appropriate clinical care team members regarding patient needs Motivational Interviewing  employed Active listening / Reflection utilized  Engineer, petroleum Provided   Patient Self Care Activities & Deficits:  Patient is unable to independently navigate community resource options without care coordination support  Acknowledges deficits and is motivated to resolve concern  Knowledge deficit of available community resources Self administers medications as prescribed Performs ADL's independently Performs IADL's independently  Please see past updates related to this goal by clicking on the "Past Updates" button in the selected goal         Follow Up Plan: SW will follow up with patient by phone over the next 7-14 business days       [employer], [place] Worker  [facility] [phone]

## 2021-01-29 NOTE — Patient Instructions (Signed)
Visit Information   Goals Addressed             This Visit's Progress    Find Help in My Community       Timeframe:  Long-Range Goal Priority:  High Start Date:     01/26/21                        Expected End Date:    07/27/20                   Follow Up Date 02/05/21    - call 211 when I need some help - follow-up on any referrals for help I am given-[facility]/[employer] - think ahead to make sure my need does not become an emergency - have a back-up plan - make a list of family or friends that I can call     Why is this important?   Knowing how and where to find help for yourself or family in your neighborhood and community is an important skill.  You will want to take some steps to learn how.    Notes:         The patient verbalized understanding of instructions, educational materials, and care plan provided today and declined offer to receive copy of patient instructions, educational materials, and care plan.   Telephone follow up appointment with care management team member scheduled for: 02/03/21   Elliot Gurney, [place] Worker  [facility] [phone]

## 2021-02-03 ENCOUNTER — Ambulatory Visit: Payer: Medicare Other | Admitting: *Deleted

## 2021-02-03 ENCOUNTER — Ambulatory Visit: Payer: Medicare Other | Admitting: Podiatry

## 2021-02-03 ENCOUNTER — Other Ambulatory Visit: Payer: Self-pay | Admitting: Family Medicine

## 2021-02-03 ENCOUNTER — Telehealth: Payer: Self-pay

## 2021-02-03 DIAGNOSIS — Z59819 Housing instability, housed unspecified: Secondary | ICD-10-CM

## 2021-02-03 DIAGNOSIS — I10 Essential (primary) hypertension: Secondary | ICD-10-CM

## 2021-02-03 DIAGNOSIS — E039 Hypothyroidism, unspecified: Secondary | ICD-10-CM

## 2021-02-03 DIAGNOSIS — M170 Bilateral primary osteoarthritis of knee: Secondary | ICD-10-CM

## 2021-02-03 NOTE — Telephone Encounter (Signed)
LOV: 01/23/2021  NOV:  04/21/2021

## 2021-02-03 NOTE — Telephone Encounter (Signed)
Requested medication (s) are due for refill today - no  Requested medication (s) are on the active medication list - yes  Future visit scheduled -yes  Last refill: 01/09/21 #120  Notes to clinic: Request RF: non delegated Rx  Requested Prescriptions  Pending Prescriptions Disp Refills   oxyCODONE-acetaminophen (PERCOCET) 10-325 MG tablet 120 tablet 0    Sig: Take 1 tablet by mouth every 6 (six) hours as needed for pain.     Not Delegated - Analgesics:  Opioid Agonist Combinations Failed - 02/03/2021 11:46 AM      Failed - This refill cannot be delegated      Failed - Urine Drug Screen completed in last 360 days      Passed - Valid encounter within last 6 months    Recent Outpatient Visits           1 week ago Arthritis of right knee   [facility] Birdie Sons, MD   1 month ago Pilonidal cyst   [facility] Birdie Sons, MD   3 months ago Acute deep vein thrombosis (DVT) of right femoral vein [facility])   [facility] Birdie Sons, MD   4 months ago Primary hypertension   [facility] Birdie Sons, MD   5 months ago Prediabetes   [facility] Birdie Sons, MD       Future Appointments             In 2 months Fisher, [name], MD [facility]               Requested Prescriptions  Pending Prescriptions Disp Refills   oxyCODONE-acetaminophen (PERCOCET) 10-325 MG tablet 120 tablet 0    Sig: Take 1 tablet by mouth every 6 (six) hours as needed for pain.     Not Delegated - Analgesics:  Opioid Agonist Combinations Failed - 02/03/2021 11:46 AM      Failed - This refill cannot be delegated      Failed - Urine Drug Screen completed in last 360 days      Passed - Valid encounter within last 6 months    Recent Outpatient Visits           1 week ago Arthritis of right knee   [facility] Birdie Sons, MD   1 month ago Pilonidal cyst    [facility] Birdie Sons, MD   3 months ago Acute deep vein thrombosis (DVT) of right femoral vein [facility])   [facility] Birdie Sons, MD   4 months ago Primary hypertension   [facility] Birdie Sons, MD   5 months ago Prediabetes   [facility] Birdie Sons, MD       Future Appointments             In 2 months Fisher, [name], MD [facility] [place]

## 2021-02-03 NOTE — Telephone Encounter (Signed)
Copied from [place] [id number]. Topic: General - Other >> Feb 03, 2021 10:06 AM Yvette Rack wrote: Reason for CRM: Pt reports that he has been trying to get in touch with [facility] for directions for over 35 minutes but he has not been able to speak with anyone. Attempted to reach [facility] several times for the pt but the automated system answers and just asked that you continue to hold which was for long periods of time. I held for over 8 minutes twice while trying to reach that office. Pt did not want to hold any longer and stated he can not go to this appt because he does not know how to get there. Pt GPS is not pulling up location and appt was scheduled for today at 10:30.

## 2021-02-03 NOTE — Chronic Care Management (AMB) (Signed)
Chronic Care Management    Clinical Social Work Note  02/03/2021 Name: Jerry Mcpherson MRN: [id number] DOB: [birth year]  Jerry Mcpherson is a 80 y.o. year old male who is a primary care patient of Fisher, [name], MD. The CCM team was consulted to assist the patient with chronic disease management and/or care coordination needs related to: [employer] .   Engaged with patient by telephone for follow up visit in response to provider referral for social work chronic care management and care coordination services.   Consent to Services:  The patient was given information about Chronic Care Management services, agreed to services, and gave verbal consent prior to initiation of services.  Please see initial visit note for detailed documentation.   Patient agreed to services and consent obtained.   Assessment: Review of patient past medical history, allergies, medications, and health status, including review of relevant consultants reports was performed today as part of a comprehensive evaluation and provision of chronic care management and care coordination services.     SDOH (Social Determinants of Health) assessments and interventions performed:    Advanced Directives Status: Not addressed in this encounter.  CCM Care Plan  Allergies  Allergen Reactions   Codeine     Heart racing   Cozaar [Losartan Potassium]     angioedema   Cyclobenzaprine     Musculoskeletal therapy agents cause Excessive sedation   Shellfish Allergy     Rash, itching, swelling   Ultram  [Tramadol Hcl] Nausea And Vomiting   Celebrex [Celecoxib] Itching, Swelling and Rash    Cannot take when eating shellfish    Outpatient Encounter Medications as of 02/03/2021  Medication Sig   acetaminophen (TYLENOL) 500 MG tablet Take 2 tablets (1,000 mg total) by mouth every 6 (six) hours as needed for mild pain or moderate pain.   amLODipine (NORVASC) 2.5 MG tablet TAKE ONE (1) TABLET BY MOUTH ONCE DAILY    Ascorbic Acid (VITAMIN C) 1000 MG tablet Take 2 tablets by mouth daily.   aspirin 81 MG tablet Take 2 tablets daily   budesonide-formoterol (SYMBICORT) 160-4.5 MCG/ACT inhaler Inhale 2 puffs into the lungs 2 (two) times daily.   cyproheptadine (PERIACTIN) 4 MG tablet TAKE 1/2-1 TABLET BY MOUTH THREE TIMES DAILY AS NEEDED TO HELP APPETITE   diclofenac Sodium (VOLTAREN) 1 % GEL Apply 4 g topically 4 (four) times daily as needed.   docusate sodium (COLACE) 100 MG capsule Take 1 capsule by mouth daily as needed.   ELIQUIS 5 MG TABS tablet Take 1 tablet (5 mg total) by mouth 2 (two) times daily.   hydrochlorothiazide (HYDRODIURIL) 25 MG tablet Take 1 tablet (25 mg total) by mouth daily.   hydroxyurea (HYDREA) 500 MG capsule Take 500 mg by mouth as directed. 2 tablets 3 days a week, and 1 tablet all other days   levothyroxine (SYNTHROID) 75 MCG tablet Take 1 tablet (75 mcg total) by mouth daily.   Multiple Vitamins-Minerals (MULTIVITAMIN ADULT PO) Take 1 tablet by mouth daily.   naproxen (NAPROSYN) 500 MG tablet TAKE 1 TABLET(500 MG) BY MOUTH TWICE DAILY WITH A MEAL   oxyCODONE-acetaminophen (PERCOCET) 10-325 MG tablet Take 1 tablet by mouth every 6 (six) hours as needed for pain.   sildenafil (VIAGRA) 50 MG tablet Take 0.5-1 tablets by mouth daily as needed.   UNABLE TO FIND Med Name: tumeric 500mg  twice a day   valACYclovir (VALTREX) 1000 MG tablet TAKE (1) TABLET BY MOUTH TWICE DAILY AS NEEDED  No facility-administered encounter medications on file as of 02/03/2021.    Patient Active Problem List   Diagnosis Date Noted   Hypothyroidism 01/25/2021   Acute deep vein thrombosis (DVT) of right femoral vein ([place]) 10/28/2020   Gross hematuria 01/17/2020   Mass of urinary bladder determined by ultrasound 01/17/2020   Leukocytosis 05/15/2018   Polycythemia vera ([place]) 05/15/2018   Erectile dysfunction 12/10/2014   Genital HSV 12/10/2014   Primary hypertension 12/10/2014   Leg cramps 12/10/2014    Leg pain 12/10/2014   Neuralgia 12/10/2014   Pinched nerve 12/10/2014   Radiculopathy 12/10/2014   Squamous cell carcinoma 12/10/2014   Vertigo 12/10/2014   Hyperglycemia 01/03/2009    Conditions to be addressed/monitored: HTN and Homelessness; Housing barriers  Care Plan : General Social Work (Adult)  Updates made by [employer], Darla Lesches, LCSW since 02/03/2021 12:00 AM     Problem: CHL AMB "PATIENT-SPECIFIC PROBLEM"   Priority: High  Note:   CARE PLAN ENTRY (see longitudinal plan of care for additional care plan information)  Current Barriers:  Patient with HTN, bladder cancer, housing instability in need of assistance with connection to community resources  Knowledge deficits and need for support, education and care coordination related to community resources support  Limited social support and Housing barriers-patient being evicted-will need to be out by tomorrow-sheriff has served him-patient was living in a home previously owned by a friend of his who passed away-patient has no legal rights to the home and will need to move-per patient due to his medical condition, he needs a "bed and a shower". Patient currently working on moving his things to a storage center  Clinical Goal(s)  Over the next 90 days, patient will work with CCM social worker to address needs related to housing   Interventions provided by LCSW:  Continued to assess patient's care coordination needs related to housing needs and discussed ongoing care management follow up  Allowed patient to vent his frustrations re-housing struggles- emotional support provided Conference call with [facility] to discuss bed availabilities-intake director will call patient back today to complete screening  Boarding house options re-addressed at $100.00 per week,  patient now re-considering this option Continued collaboraion with appropriate clinical care team members regarding patient needs Motivational Interviewing  employed Active listening / Reflection utilized  Emotional Support Provided  01/29/21 Update phone call from patient who stated that he received a return call from [employer] completed however due to the multiple restrictions he has declined stay at the [place] Patient agreeable to stay at [place] call made to complete intake-VM left requesting a return call Boarding home stay revisited-however patient continues to decline Continued research in progress for additional housing options 02/03/21 Patient continues to decline [employer] stay, however is willing to look into [employer], multiple phone calls made to [employer], however no return call received.  Intake required before patient can present there Patient encouraged to contact [place] for emergency shelter options  Patient Self Care Activities & Deficits:  Patient is unable to independently navigate community resource options without care coordination support  Acknowledges deficits and is motivated to resolve concern  Knowledge deficit of available community resources Self administers medications as prescribed Performs ADL's independently Performs IADL's independently  Please see past updates related to this goal by clicking on the "Past Updates" button in the selected goal         Follow Up Plan: SW will follow up with patient  by phone over the next 14 business days       [place], [place] Worker  [facility] Care Management [phone]

## 2021-02-03 NOTE — Telephone Encounter (Signed)
Copied from [place] [id number]. Topic: Quick Communication - Rx Refill/Question >> Feb 03, 2021 11:30 AM Leward Quan A wrote: Medication: oxyCODONE-acetaminophen (PERCOCET) 10-325 MG tablet   Has the patient contacted their pharmacy? No. Did not call since its controlled  (Agent: If no, request that the patient contact the pharmacy for the refill. If patient does not wish to contact the pharmacy document the reason why and proceed with request.) (Agent: If yes, when and what did the pharmacy advise?)  Preferred Pharmacy (with phone number or street name): [id number] - [place], [place] [address] AT [place]  Phone:  [phone] Fax:  [phone]    Has the patient been seen for an appointment in the last year OR does the patient have an upcoming appointment? Yes.    Agent: Please be advised that RX refills may take up to 3 business days. We ask that you follow-up with your pharmacy.

## 2021-02-03 NOTE — Patient Instructions (Signed)
Visit Information   Goals Addressed             This Visit's Progress    Find Help in My Community       Timeframe:  Long-Range Goal Priority:  High Start Date:     01/26/21                        Expected End Date:    07/27/20                   Follow Up Date 02/12/21    - call 211 when I need some help - follow-up on any referrals for help I am given-[facility] [facility]/[employer]ied Ch[employer]/Boarding Houses - think ahead to make sure my need does not become an emergency - have a back-up plan - make a list of family or friends that I can call     Why is this important?   Knowing how and where to find help for yourself or family in your neighborhood and community is an important skill.  You will want to take some steps to learn how.    Notes:         The patient verbalized understanding of instructions, educational materials, and care plan provided today and declined offer to receive copy of patient instructions, educational materials, and care plan.   Telephone follow up appointment with care management team member scheduled for:02/12/21   Elliot Gurney, [place] Worker  [facility] Care Management [phone]

## 2021-02-03 NOTE — Telephone Encounter (Signed)
Copied from [place] [id number]. Topic: General - Other >> Feb 03, 2021 11:32 AM Leward Quan A wrote: Reason for CRM: Patient wanted to inform Dr Caryn Section that he was not able to connect with the Dr whom Dr Caryn Section referred him to. Say that he stayed on the phone for 45 minutes to no avail and that he was not willing to go looking for the office to not get through. Please advise

## 2021-02-04 NOTE — Telephone Encounter (Signed)
Can patients referral be rescheduled and patient be given directions on how to get to [place].

## 2021-02-05 NOTE — Telephone Encounter (Signed)
Pls FU with pt as in pain, pls advise if issue, [phone]

## 2021-02-06 ENCOUNTER — Encounter: Payer: Self-pay | Admitting: Podiatry

## 2021-02-06 ENCOUNTER — Ambulatory Visit (INDEPENDENT_AMBULATORY_CARE_PROVIDER_SITE_OTHER): Payer: Medicare Other | Admitting: Podiatry

## 2021-02-06 ENCOUNTER — Other Ambulatory Visit: Payer: Self-pay

## 2021-02-06 VITALS — BP 158/85 | HR 92 | Temp 98.2°F | Resp 16

## 2021-02-06 DIAGNOSIS — M79674 Pain in right toe(s): Secondary | ICD-10-CM

## 2021-02-06 DIAGNOSIS — M79675 Pain in left toe(s): Secondary | ICD-10-CM | POA: Diagnosis not present

## 2021-02-06 DIAGNOSIS — B351 Tinea unguium: Secondary | ICD-10-CM

## 2021-02-06 MED ORDER — OXYCODONE-ACETAMINOPHEN 10-325 MG PO TABS: 1.0000 | ORAL_TABLET | Freq: Four times a day (QID) | ORAL | 0 refills | Status: DC | PRN

## 2021-02-06 NOTE — Progress Notes (Signed)
   SUBJECTIVE Patient presents to office today complaining of elongated, thickened nails that cause pain while ambulating in shoes.  Patient is unable to trim their own nails. Patient is here for further evaluation and treatment.  Past Medical History:  Diagnosis Date   Bladder cancer ([place])    Genital herpes    Hyperglycemia    Hypertension    Squamous cell carcinoma     OBJECTIVE General Patient is awake, alert, and oriented x 3 and in no acute distress. Derm Skin is dry and supple bilateral. Negative open lesions or macerations. Remaining integument unremarkable. Nails are tender, long, thickened and dystrophic with subungual debris, consistent with onychomycosis, 1-5 bilateral. No signs of infection noted. Vasc  DP and PT pedal pulses palpable bilaterally. Temperature gradient within normal limits.  Neuro Epicritic and protective threshold sensation grossly intact bilaterally.  Musculoskeletal Exam No symptomatic pedal deformities noted bilateral. Muscular strength within normal limits.  ASSESSMENT 1.  Pain due to onychomycosis of toenails both  PLAN OF CARE 1. Patient evaluated today.  2. Instructed to maintain good pedal hygiene and foot care.  3. Mechanical debridement of nails 1-5 bilaterally performed using a nail nipper. Filed with dremel without incident.  4. Return to clinic in 3 mos.    Edrick Kins, DPM [facility]  Dr. Edrick Kins, DPM    [address], [place] [address]                Office [phone]  Fax [phone]

## 2021-02-09 ENCOUNTER — Other Ambulatory Visit: Payer: Self-pay | Admitting: Family Medicine

## 2021-02-09 ENCOUNTER — Telehealth: Payer: Self-pay

## 2021-02-09 DIAGNOSIS — I10 Essential (primary) hypertension: Secondary | ICD-10-CM

## 2021-02-09 DIAGNOSIS — M1711 Unilateral primary osteoarthritis, right knee: Secondary | ICD-10-CM

## 2021-02-09 DIAGNOSIS — E039 Hypothyroidism, unspecified: Secondary | ICD-10-CM

## 2021-02-09 NOTE — Telephone Encounter (Signed)
Copied from [place] [id number]. Topic: General - Other >> Feb 09, 2021 10:43 AM Jerry Mcpherson wrote: Reason for CRM: Patient called in to inform Dr Caryn Section that he just finished moving and for the past 3 days he have not had any medication. States that he spoke with the pharmacy and they need to hear from Dr before they can do anything. Say that he will need these medications called in to [facility] on his chart. Please call patient at Ph# [phone]

## 2021-02-10 ENCOUNTER — Ambulatory Visit (INDEPENDENT_AMBULATORY_CARE_PROVIDER_SITE_OTHER): Payer: Medicare Other | Admitting: Physician Assistant

## 2021-02-10 ENCOUNTER — Encounter: Payer: Self-pay | Admitting: Physician Assistant

## 2021-02-10 ENCOUNTER — Other Ambulatory Visit: Payer: Self-pay

## 2021-02-10 DIAGNOSIS — J069 Acute upper respiratory infection, unspecified: Secondary | ICD-10-CM | POA: Diagnosis not present

## 2021-02-10 MED ORDER — FLUTICASONE PROPIONATE 50 MCG/ACT NA SUSP: 2.0000 | Freq: Every day | NASAL | 6 refills | Status: DC

## 2021-02-10 MED ORDER — BENZONATATE 100 MG PO CAPS: 100.0000 mg | ORAL_CAPSULE | Freq: Two times a day (BID) | ORAL | 0 refills | Status: DC | PRN

## 2021-02-10 NOTE — Progress Notes (Signed)
MyChart Video Visit    Virtual Visit via Telephone Note   This visit type was conducted due to national recommendations for restrictions regarding the COVID-19 Pandemic (e.g. social distancing) in an effort to limit this patient's exposure and mitigate transmission in our community. This patient is at least at moderate risk for complications without adequate follow up. This format is felt to be most appropriate for this patient at this time. Physical exam was limited by quality of the audio technology used for the visit.   Patient location: home Provider location: [facility]  I discussed the limitations of evaluation and management by telemedicine and the availability of in person appointments. The patient expressed understanding and agreed to proceed.  Patient: Jerry Mcpherson   DOB: 05-17-1940   80 y.o. Male  MRN: [id number] Visit Date: 02/10/2021  Today's healthcare provider: Mikey Kirschner, PA-C   Chief Complaint  Patient presents with   Sinus Problem        Sore Throat   Cough   Hoarse   Subjective    HPI HPI     Sinus Problem    Additional comments:        Last edited by Beverlee Nims, [place] on 02/10/2021 10:17 AM.    Jerry Mcpherson is an 80 y/o male w/ a history of polycythemia vera and invasive bladder cancer, s/p cystoprostatectomy 4/22 who presents with concerns over nasal congestion, rhinorrhea, and cough for the last 3-4 days. He reports sometimes the mucous is a green-yellow color. He is having difficulty sleeping due to cough.Denies taking anything OTC for symptom relief, is only doing salt water gargles.  Denies any covid testing.   Medications: Outpatient Medications Prior to Visit  Medication Sig   acetaminophen (TYLENOL) 500 MG tablet Take 2 tablets (1,000 mg total) by mouth every 6 (six) hours as needed for mild pain or moderate pain.   amLODipine (NORVASC) 2.5 MG tablet TAKE ONE (1) TABLET BY MOUTH ONCE DAILY   Ascorbic Acid (VITAMIN C) 1000 MG tablet Take  2 tablets by mouth daily.   aspirin 81 MG tablet Take 2 tablets daily   budesonide-formoterol (SYMBICORT) 160-4.5 MCG/ACT inhaler Inhale 2 puffs into the lungs 2 (two) times daily.   cyproheptadine (PERIACTIN) 4 MG tablet TAKE 1/2-1 TABLET BY MOUTH THREE TIMES DAILY AS NEEDED TO HELP APPETITE   diclofenac Sodium (VOLTAREN) 1 % GEL Apply 4 g topically 4 (four) times daily as needed.   docusate sodium (COLACE) 100 MG capsule Take 1 capsule by mouth daily as needed.   ELIQUIS 5 MG TABS tablet Take 1 tablet (5 mg total) by mouth 2 (two) times daily.   hydrochlorothiazide (HYDRODIURIL) 25 MG tablet Take 1 tablet (25 mg total) by mouth daily.   hydroxyurea (HYDREA) 500 MG capsule Take 500 mg by mouth as directed. 2 tablets 3 days a week, and 1 tablet all other days   levothyroxine (SYNTHROID) 75 MCG tablet TAKE (1) TABLET BY MOUTH EVERY DAY   Multiple Vitamins-Minerals (MULTIVITAMIN ADULT PO) Take 1 tablet by mouth daily.   naproxen (NAPROSYN) 500 MG tablet TAKE 1 TABLET(500 MG) BY MOUTH TWICE DAILY WITH A MEAL   oxyCODONE-acetaminophen (PERCOCET) 10-325 MG tablet Take 1 tablet by mouth every 6 (six) hours as needed for pain.   sildenafil (VIAGRA) 50 MG tablet Take 0.5-1 tablets by mouth daily as needed.   valACYclovir (VALTREX) 1000 MG tablet TAKE (1) TABLET BY MOUTH TWICE DAILY AS NEEDED   [DISCONTINUED] UNABLE TO FIND Med Name: tumeric  500mg  twice a day   No facility-administered medications prior to visit.    Review of Systems  Constitutional:  Positive for fatigue. Negative for fever.  HENT:  Positive for congestion, postnasal drip, rhinorrhea, sinus pressure and sore throat.   Respiratory:  Positive for cough. Negative for chest tightness and shortness of breath.   Cardiovascular:  Negative for chest pain.     Objective    Reports bp at home is 120s/80s  Physical Exam Constitutional:      General: He is not in acute distress. Pulmonary:     [place]: Pulmonary effort is normal.        Assessment & Plan     Upper respiratory infection Rx tessalon for cough, flonase and saline nasal spray for sinus congestion/rhinorrea. Advised he take a COVID test.  If not improved in the next 7-10 days, please call and I can send in an antibiotic. Return if symptoms worsen or fail to improve.     I discussed the assessment and treatment plan with the patient. The patient was provided an opportunity to ask questions and all were answered. The patient agreed with the plan and demonstrated an understanding of the instructions.   The patient was advised to call back or seek an in-person evaluation if the symptoms worsen or if the condition fails to improve as anticipated.  I provided 15 minutes of non-face-to-face time during this encounter.  I, Mikey Kirschner, PA-C have reviewed all documentation for this visit. The documentation on 02/10/2021 for the exam, diagnosis, procedures, and orders are all accurate and complete.   Mikey Kirschner, PA-C [facility] [phone] (phone) [phone] (fax)  [place]

## 2021-02-11 NOTE — Telephone Encounter (Signed)
Spoke with Jerry Mcpherson.  He found most of his medicines since moving.  He is still missing levothyroxine, pt does not want a refill at this time because the pharmacy states it will cost him over $200 out of pocket to get it refill.  [facility] will not cover an early refill.)  He is going to continue to look for it.     Thanks,   -[name]

## 2021-02-12 ENCOUNTER — Ambulatory Visit (INDEPENDENT_AMBULATORY_CARE_PROVIDER_SITE_OTHER): Payer: Medicare Other | Admitting: *Deleted

## 2021-02-12 DIAGNOSIS — Z59819 Housing instability, housed unspecified: Secondary | ICD-10-CM

## 2021-02-12 DIAGNOSIS — I10 Essential (primary) hypertension: Secondary | ICD-10-CM

## 2021-02-12 DIAGNOSIS — E039 Hypothyroidism, unspecified: Secondary | ICD-10-CM

## 2021-02-12 NOTE — Patient Instructions (Signed)
Visit Information  PATIENT GOALS/PLAN OF CARE:  Care Plan : General Social Work (Adult)  Updates made by [employer], Darla Lesches, LCSW since 02/12/2021 12:00 AM     Problem: CHL AMB "PATIENT-SPECIFIC PROBLEM"   Priority: High  Note:   CARE PLAN ENTRY (see longitudinal plan of care for additional care plan information)  Current Barriers:  Patient with HTN, bladder cancer, housing instability in need of assistance with connection to community resources  Knowledge deficits and need for support, education and care coordination related to community resources support  Limited social support and Housing barriers-patient being evicted-will need to be out by tomorrow-sheriff has served him-patient was living in a home previously owned by a friend of his who passed away-patient has no legal rights to the home and will need to move-per patient due to his medical condition, he needs a "bed and a shower". Patient currently working on moving his things to a storage center  Clinical Goal(s)  Over the next 90 days, patient will work with CCM social worker to address needs related to housing   Interventions provided by LCSW:  Continued to assess patient's care coordination needs related to housing needs and discussed ongoing care management follow up  Allowed patient to vent his frustrations re-housing struggles- emotional support provided Confirmed that patient has contacted local shelters, however has declined this option  Boarding house options re-addressed at $100.00 per week,  patient has declined this option Additional options discussed, including option of Assisted Living-patient has declined this option Confirmed that patient is currently residing with a friend most days and is agreeable with this plan for now-will stay in car as last resort [place] shelter suggested as an alternative as well Motivational Interviewing employed Active listening / Reflection utilized  Engineer, petroleum Provided   Self care and safety continued to be emphasized  Patient Self Care Activities & Deficits:  Patient is unable to independently navigate community resource options without care coordination support  Acknowledges deficits and is motivated to resolve concern  Knowledge deficit of available community resources Self administers medications as prescribed Performs ADL's independently Performs IADL's independently  Please see past updates related to this goal by clicking on the "Past Updates" button in the selected goal       The patient verbalized understanding of instructions, educational materials, and care plan provided today and declined offer to receive copy of patient instructions, educational materials, and care plan.   No further follow up required: patient to contact thi Education officer, museum with any additional community resource needs    [employer], [place] Worker  [facility] Care Management [phone]

## 2021-02-12 NOTE — Chronic Care Management (AMB) (Signed)
Chronic Care Management    Clinical Social Work Note  02/12/2021 Name: Jerry Mcpherson MRN: [id number] DOB: September 19, 1940  Jerry Mcpherson is a 80 y.o. year old male who is a primary care patient of Fisher, [name], MD. The CCM team was consulted to assist the patient with chronic disease management and/or care coordination needs related to: [employer] .   Engaged with patient by telephone for follow up visit in response to provider referral for social work chronic care management and care coordination services.   Consent to Services:  The patient was given information about Chronic Care Management services, agreed to services, and gave verbal consent prior to initiation of services.  Please see initial visit note for detailed documentation.   Patient agreed to services and consent obtained.   Assessment: Review of patient past medical history, allergies, medications, and health status, including review of relevant consultants reports was performed today as part of a comprehensive evaluation and provision of chronic care management and care coordination services.     SDOH (Social Determinants of Health) assessments and interventions performed:    Advanced Directives Status: Not addressed in this encounter.  CCM Care Plan  Allergies  Allergen Reactions   Codeine     Heart racing   Cozaar [Losartan Potassium]     angioedema   Cyclobenzaprine     Musculoskeletal therapy agents cause Excessive sedation   Shellfish Allergy     Rash, itching, swelling   Ultram  [Tramadol Hcl] Nausea And Vomiting   Celebrex [Celecoxib] Itching, Swelling and Rash    Cannot take when eating shellfish    Outpatient Encounter Medications as of 02/12/2021  Medication Sig   acetaminophen (TYLENOL) 500 MG tablet Take 2 tablets (1,000 mg total) by mouth every 6 (six) hours as needed for mild pain or moderate pain.   amLODipine (NORVASC) 2.5 MG tablet TAKE ONE (1) TABLET BY MOUTH ONCE DAILY    Ascorbic Acid (VITAMIN C) 1000 MG tablet Take 2 tablets by mouth daily.   aspirin 81 MG tablet Take 2 tablets daily   benzonatate (TESSALON) 100 MG capsule Take 1 capsule (100 mg total) by mouth 2 (two) times daily as needed for cough.   budesonide-formoterol (SYMBICORT) 160-4.5 MCG/ACT inhaler Inhale 2 puffs into the lungs 2 (two) times daily.   cyproheptadine (PERIACTIN) 4 MG tablet TAKE 1/2-1 TABLET BY MOUTH THREE TIMES DAILY AS NEEDED TO HELP APPETITE   diclofenac Sodium (VOLTAREN) 1 % GEL Apply 4 g topically 4 (four) times daily as needed.   docusate sodium (COLACE) 100 MG capsule Take 1 capsule by mouth daily as needed.   ELIQUIS 5 MG TABS tablet Take 1 tablet (5 mg total) by mouth 2 (two) times daily.   fluticasone (FLONASE) 50 MCG/ACT nasal spray Place 2 sprays into both nostrils daily.   hydrochlorothiazide (HYDRODIURIL) 25 MG tablet Take 1 tablet (25 mg total) by mouth daily.   hydroxyurea (HYDREA) 500 MG capsule Take 500 mg by mouth as directed. 2 tablets 3 days a week, and 1 tablet all other days   levothyroxine (SYNTHROID) 75 MCG tablet TAKE (1) TABLET BY MOUTH EVERY DAY   Multiple Vitamins-Minerals (MULTIVITAMIN ADULT PO) Take 1 tablet by mouth daily.   naproxen (NAPROSYN) 500 MG tablet TAKE 1 TABLET(500 MG) BY MOUTH TWICE DAILY WITH A MEAL   oxyCODONE-acetaminophen (PERCOCET) 10-325 MG tablet Take 1 tablet by mouth every 6 (six) hours as needed for pain.   sildenafil (VIAGRA) 50 MG tablet Take 0.5-1  tablets by mouth daily as needed.   valACYclovir (VALTREX) 1000 MG tablet TAKE (1) TABLET BY MOUTH TWICE DAILY AS NEEDED   No facility-administered encounter medications on file as of 02/12/2021.    Patient Active Problem List   Diagnosis Date Noted   Hypothyroidism 01/25/2021   Acute deep vein thrombosis (DVT) of right femoral vein ([place]) 10/28/2020   Gross hematuria 01/17/2020   Mass of urinary bladder determined by ultrasound 01/17/2020   Leukocytosis 05/15/2018   Polycythemia  vera ([place]) 05/15/2018   Erectile dysfunction 12/10/2014   Genital HSV 12/10/2014   Primary hypertension 12/10/2014   Leg cramps 12/10/2014   Leg pain 12/10/2014   Neuralgia 12/10/2014   Pinched nerve 12/10/2014   Radiculopathy 12/10/2014   Squamous cell carcinoma 12/10/2014   Vertigo 12/10/2014   Hyperglycemia 01/03/2009    Conditions to be addressed/monitored:  HTN and Homelessness; Housing barriers Care Plan : General Social Work (Adult)  Updates made by [employer], Darla Lesches, LCSW since 02/12/2021 12:00 AM     Problem: CHL AMB "PATIENT-SPECIFIC PROBLEM"   Priority: High  Note:   CARE PLAN ENTRY (see longitudinal plan of care for additional care plan information)  Current Barriers:  Patient with HTN, bladder cancer, housing instability in need of assistance with connection to community resources  Knowledge deficits and need for support, education and care coordination related to community resources support  Limited social support and Housing barriers-patient being evicted-will need to be out by tomorrow-sheriff has served him-patient was living in a home previously owned by a friend of his who passed away-patient has no legal rights to the home and will need to move-per patient due to his medical condition, he needs a "bed and a shower". Patient currently working on moving his things to a storage center  Clinical Goal(s)  Over the next 90 days, patient will work with CCM social worker to address needs related to housing   Interventions provided by LCSW:  Continued to assess patient's care coordination needs related to housing needs and discussed ongoing care management follow up  Allowed patient to vent his frustrations re-housing struggles- emotional support provided Confirmed that patient has contacted local shelters, however has declined this option  Boarding house options re-addressed at $100.00 per week,  patient has declined this option Additional options discussed, including  option of Assisted Living-patient has declined this option Confirmed that patient is currently residing with a friend most days and is agreeable with this plan for now-will stay in car as last resort [facility] shelter suggested as an alternative as well Motivational Interviewing employed Active listening / Reflection utilized  Engineer, petroleum Provided  Self care and safety continued to be emphasized  Patient Self Care Activities & Deficits:  Patient is unable to independently navigate community resource options without care coordination support  Acknowledges deficits and is motivated to resolve concern  Knowledge deficit of available community resources Self administers medications as prescribed Performs ADL's independently Performs IADL's independently  Please see past updates related to this goal by clicking on the "Past Updates" button in the selected goal         Follow Up Plan: Client will contact this Education officer, museum with any additional community resource needs       [place], [place] Worker  [facility] Care Management [phone]

## 2021-02-18 DIAGNOSIS — Z5900 Homelessness unspecified: Secondary | ICD-10-CM | POA: Insufficient documentation

## 2021-02-26 ENCOUNTER — Telehealth: Payer: Self-pay

## 2021-02-26 NOTE — Telephone Encounter (Signed)
Copied from [place] [id number]. Topic: General - Other >> Feb 26, 2021 12:45 PM Celene Kras wrote: Reason for CRM: Pt called stating that he received a call from the office. He is requesting to have someone give him a call back to discuss what he was being called for. Please advise.

## 2021-03-04 ENCOUNTER — Ambulatory Visit: Payer: Self-pay | Admitting: *Deleted

## 2021-03-04 DIAGNOSIS — Z59819 Housing instability, housed unspecified: Secondary | ICD-10-CM

## 2021-03-04 DIAGNOSIS — I10 Essential (primary) hypertension: Secondary | ICD-10-CM

## 2021-03-04 DIAGNOSIS — E039 Hypothyroidism, unspecified: Secondary | ICD-10-CM

## 2021-03-04 NOTE — Chronic Care Management (AMB) (Signed)
\ Chronic Care Management    Clinical Social Work Note  03/04/2021 Name: Jerry Mcpherson MRN: [id number] DOB: [birth year]  Jerry Mcpherson is a 80 y.o. year old male who is a primary care patient of Fisher, [name], MD. The CCM team was consulted to assist the patient with chronic disease management and/or care coordination needs related to: [employer] .   Engaged with patient by telephone for follow up visit in response to provider referral for social work chronic care management and care coordination services.   Consent to Services:  The patient was given information about Chronic Care Management services, agreed to services, and gave verbal consent prior to initiation of services.  Please see initial visit note for detailed documentation.   Patient agreed to services and consent obtained.   Assessment: Review of patient past medical history, allergies, medications, and health status, including review of relevant consultants reports was performed today as part of a comprehensive evaluation and provision of chronic care management and care coordination services.     SDOH (Social Determinants of Health) assessments and interventions performed:    Advanced Directives Status: Not addressed in this encounter.  CCM Care Plan  Allergies  Allergen Reactions   Codeine     Heart racing   Cozaar [Losartan Potassium]     angioedema   Cyclobenzaprine     Musculoskeletal therapy agents cause Excessive sedation   Shellfish Allergy     Rash, itching, swelling   Ultram  [Tramadol Hcl] Nausea And Vomiting   Celebrex [Celecoxib] Itching, Swelling and Rash    Cannot take when eating shellfish    Outpatient Encounter Medications as of 03/04/2021  Medication Sig   acetaminophen (TYLENOL) 500 MG tablet Take 2 tablets (1,000 mg total) by mouth every 6 (six) hours as needed for mild pain or moderate pain.   amLODipine (NORVASC) 2.5 MG tablet TAKE ONE (1) TABLET BY MOUTH ONCE DAILY    Ascorbic Acid (VITAMIN C) 1000 MG tablet Take 2 tablets by mouth daily.   aspirin 81 MG tablet Take 2 tablets daily   benzonatate (TESSALON) 100 MG capsule Take 1 capsule (100 mg total) by mouth 2 (two) times daily as needed for cough.   budesonide-formoterol (SYMBICORT) 160-4.5 MCG/ACT inhaler Inhale 2 puffs into the lungs 2 (two) times daily.   cyproheptadine (PERIACTIN) 4 MG tablet TAKE 1/2-1 TABLET BY MOUTH THREE TIMES DAILY AS NEEDED TO HELP APPETITE   diclofenac Sodium (VOLTAREN) 1 % GEL Apply 4 g topically 4 (four) times daily as needed.   docusate sodium (COLACE) 100 MG capsule Take 1 capsule by mouth daily as needed.   ELIQUIS 5 MG TABS tablet Take 1 tablet (5 mg total) by mouth 2 (two) times daily.   fluticasone (FLONASE) 50 MCG/ACT nasal spray Place 2 sprays into both nostrils daily.   hydrochlorothiazide (HYDRODIURIL) 25 MG tablet Take 1 tablet (25 mg total) by mouth daily.   hydroxyurea (HYDREA) 500 MG capsule Take 500 mg by mouth as directed. 2 tablets 3 days a week, and 1 tablet all other days   levothyroxine (SYNTHROID) 75 MCG tablet TAKE (1) TABLET BY MOUTH EVERY DAY   Multiple Vitamins-Minerals (MULTIVITAMIN ADULT PO) Take 1 tablet by mouth daily.   naproxen (NAPROSYN) 500 MG tablet TAKE 1 TABLET(500 MG) BY MOUTH TWICE DAILY WITH A MEAL   oxyCODONE-acetaminophen (PERCOCET) 10-325 MG tablet Take 1 tablet by mouth every 6 (six) hours as needed for pain.   sildenafil (VIAGRA) 50 MG tablet Take  0.5-1 tablets by mouth daily as needed.   valACYclovir (VALTREX) 1000 MG tablet TAKE (1) TABLET BY MOUTH TWICE DAILY AS NEEDED   No facility-administered encounter medications on file as of 03/04/2021.    Patient Active Problem List   Diagnosis Date Noted   Hypothyroidism 01/25/2021   Acute deep vein thrombosis (DVT) of right femoral vein ([facility]) 10/28/2020   Gross hematuria 01/17/2020   Mass of urinary bladder determined by ultrasound 01/17/2020   Leukocytosis 05/15/2018    Polycythemia vera ([place]) 05/15/2018   Erectile dysfunction 12/10/2014   Genital HSV 12/10/2014   Primary hypertension 12/10/2014   Leg cramps 12/10/2014   Leg pain 12/10/2014   Neuralgia 12/10/2014   Pinched nerve 12/10/2014   Radiculopathy 12/10/2014   Squamous cell carcinoma 12/10/2014   Vertigo 12/10/2014   Hyperglycemia 01/03/2009    Conditions to be addressed/monitored: HTN and Homelessness; Housing barriers  Care Plan : General Social Work (Adult)  Updates made by [employer], Darla Lesches, LCSW since 03/04/2021 12:00 AM     Problem: CHL AMB "PATIENT-SPECIFIC PROBLEM"   Priority: High  Note:   CARE PLAN ENTRY (see longitudinal plan of care for additional care plan information)  Current Barriers:  Patient with HTN, bladder cancer, housing instability in need of assistance with connection to community resources  Knowledge deficits and need for support, education and care coordination related to community resources support  Limited social support and Housing barriers-patient being evicted-will need to be out by tomorrow-sheriff has served him-patient was living in a home previously owned by a friend of his who passed away-patient has no legal rights to the home and will need to move-per patient due to his medical condition, he needs a "bed and a shower". Patient currently working on moving his things to a storage center  Clinical Goal(s)  Over the next 90 days, patient will work with CCM social worker to address needs related to housing   Interventions provided by LCSW:  Continued to assess patient's care coordination needs related to housing needs and discussed ongoing care management follow up  Allowed patient to vent his frustrations re-housing struggles- emotional support provided Confirmed that patient has contacted local shelters, however continues to decline this option  Boarding house options re-addressed at $100.00 per week,  patient has declined this option Additional  options continues to be discussed, including option of Assisted Living-patient has declined this option Patient agreeable to applying for Section 8, explained that there is a wait list for [employer] as well as [facility] Confirmed that patient is currently residing with a friend most days and is agreeable with this plan for now-will continue to stay in car most nights and with friends during the day Patient maintains choice to reside in his car most nights with friends homes being available during the day [facility] shelter suggested as an alternative as well information provided Utilization of housing resources recommended Motivational Interviewing employed Active listening / Reflection utilized  Engineer, petroleum Provided  Self care and safety continued to be emphasized Patient encouraged to contact this Education officer, museum with any additional community resource needs  Patient Self Care Activities & Deficits:  Patient is unable to independently navigate community resource options without care coordination support  Acknowledges deficits and is motivated to resolve concern  Knowledge deficit of available community resources Self administers medications as prescribed Performs ADL's independently Performs IADL's independently  Please see past updates related to this goal by clicking on the "Past Updates" button in the selected  goal         Follow Up Plan: Client will contact this social worker with any additional community resource needs       [place], [place] Worker  [facility] Care [facility] [phone]

## 2021-03-04 NOTE — Patient Instructions (Addendum)
Visit Information  Thank you for taking time to visit with me today. Please don't hesitate to contact me if I can be of assistance to you before our next scheduled telephone appointment.  Following are the goals we discussed today:   - call 211 when I need some help - follow-up on any referrals for help I am given-[facility]/Allied Churches/Boarding Houses/Section 8 when needed - think ahead to make sure my need does not become an emergency - have a back-up plan - make a list of family or friends that I can call       Please call the Suicide and Crisis Lifeline: 988 call 911 if you are experiencing a Mental Health or [place] or need someone to talk to.   Following is a copy of your full plan of care:  Care Plan : General Social Work (Adult)  Updates made by [employer], Darla Lesches, LCSW since 03/04/2021 12:00 AM     Problem: CHL AMB "PATIENT-SPECIFIC PROBLEM"   Priority: High  Note:   CARE PLAN ENTRY (see longitudinal plan of care for additional care plan information)  Current Barriers:  Patient with HTN, bladder cancer, housing instability in need of assistance with connection to community resources  Knowledge deficits and need for support, education and care coordination related to community resources support  Limited social support and Housing barriers-patient being evicted-will need to be out by tomorrow-sheriff has served him-patient was living in a home previously owned by a friend of his who passed away-patient has no legal rights to the home and will need to move-per patient due to his medical condition, he needs a "bed and a shower". Patient currently working on moving his things to a storage center  Clinical Goal(s)  Over the next 90 days, patient will work with CCM social worker to address needs related to housing   Interventions provided by LCSW:  Continued to assess patient's care coordination needs related to housing needs and discussed  ongoing care management follow up  Allowed patient to vent his frustrations re-housing struggles- emotional support provided Confirmed that patient has contacted local shelters, however continues to decline this option  Boarding house options re-addressed at $100.00 per week,  patient has declined this option Additional options continues to be discussed, including option of Assisted Living-patient has declined this option Patient agreeable to applying for Section 8, explained that there is a wait list for [employer] as well as [facility] Confirmed that patient is currently residing with a friend most days and is agreeable with this plan for now-will continue to stay in car most nights and with friends during the day Patient maintains choice to reside in his car most nights with friends homes being available during the day [facility] shelter suggested as an alternative as well information provided Utilization of housing resources recommended Motivational Interviewing employed Active listening / Reflection utilized  Engineer, petroleum Provided  Self care and safety continued to be emphasized Patient encouraged to contact this Education officer, museum with any additional community resource needs  Patient Self Care Activities & Deficits:  Patient is unable to independently navigate community resource options without care coordination support  Acknowledges deficits and is motivated to resolve concern  Knowledge deficit of available community resources Self administers medications as prescribed Performs ADL's independently Performs IADL's independently  Please see past updates related to this goal by clicking on the "Past Updates" button in the selected goal        Mr. Rekowski  was given information about Care Management services by the embedded care coordination team including:  Care Management services include personalized support from designated clinical staff supervised by his  physician, including individualized plan of care and coordination with other care providers 24/7 contact phone numbers for assistance for urgent and routine care needs. The patient may stop CCM services at any time (effective at the end of the month) by phone call to the office staff.  Patient agreed to services and verbal consent obtained.   The patient verbalized understanding of instructions, educational materials, and care plan provided today and declined offer to receive copy of patient instructions, educational materials, and care plan.   No further follow up required: patient to contact this Education officer, museum with any additional community resource needs   [employer], [place] Worker  [facility] [phone]

## 2021-03-09 ENCOUNTER — Other Ambulatory Visit: Payer: Self-pay | Admitting: Family Medicine

## 2021-03-09 DIAGNOSIS — M170 Bilateral primary osteoarthritis of knee: Secondary | ICD-10-CM

## 2021-03-09 NOTE — Telephone Encounter (Signed)
Patient would like a refill on his oxyCODONE-acetaminophen (PERCOCET) 10-325 MG tablet  medication and have it sent to the [facility] in [place].

## 2021-03-10 MED ORDER — OXYCODONE-ACETAMINOPHEN 10-325 MG PO TABS: 1.0000 | ORAL_TABLET | Freq: Four times a day (QID) | ORAL | 0 refills | Status: DC | PRN

## 2021-03-10 NOTE — Telephone Encounter (Signed)
Requested medications are due for refill today was given rx 02/06/21 for 120 with one refill  Requested medications are on the active medication list yes  Last visit 01/23/21  Future visit scheduled 04/21/21  Notes to clinic This medication can not be delegated, please assess.  Requested Prescriptions  Pending Prescriptions Disp Refills   oxyCODONE-acetaminophen (PERCOCET) 10-325 MG tablet 120 tablet 0    Sig: Take 1 tablet by mouth every 6 (six) hours as needed for pain.     Not Delegated - Analgesics:  Opioid Agonist Combinations Failed - 03/09/2021  3:19 PM      Failed - This refill cannot be delegated      Failed - Urine Drug Screen completed in last 360 days      Passed - Valid encounter within last 6 months    Recent Outpatient Visits           4 weeks ago Viral upper respiratory tract infection   [facility] Mikey Kirschner, PA-C   1 month ago Arthritis of right knee    [facility] Birdie Sons, MD   2 months ago Pilonidal cyst   [facility] Birdie Sons, MD   4 months ago Acute deep vein thrombosis (DVT) of right femoral vein [facility])   [facility] Birdie Sons, MD   5 months ago Primary hypertension   [facility] Birdie Sons, MD       Future Appointments             In 1 month Fisher, [name], MD [facility], [place]

## 2021-04-07 ENCOUNTER — Other Ambulatory Visit: Payer: Self-pay | Admitting: Family Medicine

## 2021-04-07 DIAGNOSIS — M170 Bilateral primary osteoarthritis of knee: Secondary | ICD-10-CM

## 2021-04-07 MED ORDER — OXYCODONE-ACETAMINOPHEN 10-325 MG PO TABS: 1.0000 | ORAL_TABLET | Freq: Four times a day (QID) | ORAL | 0 refills | Status: DC | PRN

## 2021-04-07 NOTE — Telephone Encounter (Addendum)
Medication Refill - Medication: Oxycodone  Has the patient contacted their pharmacy? Yes.   (Agent: If no, request that the patient contact the pharmacy for the refill. If patient does not wish to contact the pharmacy document the reason why and proceed with request.) (Agent: If yes, when and what did the pharmacy advise?)  Patient states that he contacted [facility] but they were closed all weekend and was not able to get his prescription. Patient would like for prescription to be sent to [facility] instead of [place]. Patient states that he is out of medication.   Preferred Pharmacy (with phone number or street name): [facility] in [place] Has the patient been seen for an appointment in the last year OR does the patient have an upcoming appointment? Yes.     Agent: Please be advised that RX refills may take up to 3 business days. We ask that you follow-up with your pharmacy.

## 2021-04-07 NOTE — Telephone Encounter (Signed)
Please review for refill. Refill not delegated per rx protocol.

## 2021-04-20 DIAGNOSIS — I483 Typical atrial flutter: Secondary | ICD-10-CM | POA: Diagnosis not present

## 2021-04-20 DIAGNOSIS — I10 Essential (primary) hypertension: Secondary | ICD-10-CM | POA: Diagnosis not present

## 2021-04-21 ENCOUNTER — Other Ambulatory Visit: Payer: Self-pay

## 2021-04-21 ENCOUNTER — Encounter: Payer: Self-pay | Admitting: Family Medicine

## 2021-04-21 ENCOUNTER — Ambulatory Visit (INDEPENDENT_AMBULATORY_CARE_PROVIDER_SITE_OTHER): Payer: Medicare PPO | Admitting: Family Medicine

## 2021-04-21 VITALS — BP 120/85 | HR 90 | Temp 97.7°F | Resp 18 | Wt 160.0 lb

## 2021-04-21 DIAGNOSIS — R630 Anorexia: Secondary | ICD-10-CM | POA: Diagnosis not present

## 2021-04-21 DIAGNOSIS — E039 Hypothyroidism, unspecified: Secondary | ICD-10-CM

## 2021-04-21 MED ORDER — LEVOTHYROXINE SODIUM 75 MCG PO TABS: ORAL_TABLET | ORAL | 1 refills | Status: DC

## 2021-04-21 MED ORDER — CYPROHEPTADINE HCL 4 MG PO TABS: ORAL_TABLET | ORAL | 3 refills | Status: DC

## 2021-04-21 NOTE — Progress Notes (Signed)
Established patient visit   Patient: Jerry Mcpherson   DOB: August 07, 1940   81 y.o. Male  MRN: [id number] Visit Date: 04/21/2021  Today's healthcare provider: Lelon Huh, MD   Chief Complaint  Patient presents with   Hypothyroidism   Subjective    HPI  Hypothyroid, follow-up  Lab Results  Component Value Date   TSH 6.710 (H) 01/23/2021   TSH 4.570 (H) 08/02/2018   FREET4 1.14 08/02/2018    Wt Readings from Last 3 Encounters:  04/21/21 160 lb (72.6 kg)  01/23/21 159 lb (72.1 kg)  10/28/20 149 lb (67.6 kg)    He was last seen for hypothyroid 3 months ago.  Management since that visit includes changing the dose of levothyroxine to 4mcg daily. He reports poor compliance with treatment. Patient has not been taking levothyroxine for the past 3 months. He states he lost most of his medications when he was forced to move out of his residence.  He is not having side effects.   Symptoms: No change in energy level No constipation  No diarrhea No heat / cold intolerance  No nervousness No palpitations  No weight changes    -----------------------------------------------------------------------------------------   Medications: Outpatient Medications Prior to Visit  Medication Sig   acetaminophen (TYLENOL) 500 MG tablet Take 2 tablets (1,000 mg total) by mouth every 6 (six) hours as needed for mild pain or moderate pain.   amLODipine (NORVASC) 2.5 MG tablet TAKE ONE (1) TABLET BY MOUTH ONCE DAILY   Ascorbic Acid (VITAMIN C) 1000 MG tablet Take 2 tablets by mouth daily.   aspirin 81 MG tablet Take 2 tablets daily   budesonide-formoterol (SYMBICORT) 160-4.5 MCG/ACT inhaler Inhale 2 puffs into the lungs 2 (two) times daily.   cyproheptadine (PERIACTIN) 4 MG tablet TAKE 1/2-1 TABLET BY MOUTH THREE TIMES DAILY AS NEEDED TO HELP APPETITE   diclofenac Sodium (VOLTAREN) 1 % GEL Apply 4 g topically 4 (four) times daily as needed.   docusate sodium (COLACE) 100 MG capsule  Take 1 capsule by mouth daily as needed.   ELIQUIS 5 MG TABS tablet Take 1 tablet (5 mg total) by mouth 2 (two) times daily.   fluticasone (FLONASE) 50 MCG/ACT nasal spray Place 2 sprays into both nostrils daily.   hydrochlorothiazide (HYDRODIURIL) 25 MG tablet Take 1 tablet (25 mg total) by mouth daily.   hydroxyurea (HYDREA) 500 MG capsule Take 500 mg by mouth as directed. 2 tablets 3 days a week, and 1 tablet all other days   Multiple Vitamins-Minerals (MULTIVITAMIN ADULT PO) Take 1 tablet by mouth daily.   naproxen (NAPROSYN) 500 MG tablet TAKE 1 TABLET(500 MG) BY MOUTH TWICE DAILY WITH A MEAL   oxyCODONE-acetaminophen (PERCOCET) 10-325 MG tablet Take 1 tablet by mouth every 6 (six) hours as needed for pain.   sildenafil (VIAGRA) 50 MG tablet Take 0.5-1 tablets by mouth daily as needed.   valACYclovir (VALTREX) 1000 MG tablet TAKE (1) TABLET BY MOUTH TWICE DAILY AS NEEDED   levothyroxine (SYNTHROID) 75 MCG tablet TAKE (1) TABLET BY MOUTH EVERY DAY (Patient not taking: Reported on 04/21/2021)   [DISCONTINUED] benzonatate (TESSALON) 100 MG capsule Take 1 capsule (100 mg total) by mouth 2 (two) times daily as needed for cough. (Patient not taking: Reported on 04/21/2021)   No facility-administered medications prior to visit.    Review of Systems  Constitutional:  Positive for appetite change. Negative for chills and fever.  Respiratory:  Negative for chest tightness, shortness of breath  and wheezing.   Cardiovascular:  Negative for chest pain and palpitations.  Gastrointestinal:  Negative for abdominal pain, nausea and vomiting.      Objective    BP 120/85 (BP Location: Left Arm, Patient Position: Sitting, Cuff Size: Normal)    Pulse 90    Temp 97.7 F (36.5 C) (Oral)    Resp 18    Wt 160 lb (72.6 kg)    SpO2 97% Comment: room air   BMI 24.33 kg/m  {Show previous vital signs (optional):23777}  Physical Exam   General: Appearance:    Well developed, well nourished male in no acute  distress  Eyes:    PERRL, conjunctiva/corneas clear, EOM's intact       Lungs:     Clear to auscultation bilaterally, respirations unlabored  Heart:    Normal heart rate. Normal rhythm. No murmurs, rubs, or gallops.    MS:   All extremities are intact.    Neurologic:   Awake, alert, oriented x 3. No apparent focal neurological defect.          Assessment & Plan     1. Hypothyroidism, unspecified type He lost his levothyroxine prescription when he was evicted from his previous apartment. He is now going back and forth between his sister's place and a friends house. Will restart levothyroxine (SYNTHROID) 75 MCG tablet; TAKE (1) TABLET BY MOUTH EVERY DAY  Dispense: 90 tablet; Refill: 1  Follow up to check TSH in 3 months.   2. Loss of appetite refill cyproheptadine (PERIACTIN) 4 MG tablet; TAKE 1/2-1 TABLET BY MOUTH THREE TIMES DAILY AS NEEDED TO HELP APPETITE  Dispense: 60 tablet; Refill: 3      The entirety of the information documented in the History of Present Illness, Review of Systems and Physical Exam were personally obtained by me. Portions of this information were initially documented by the CMA and reviewed by me for thoroughness and accuracy.     Lelon Huh, MD  [facility] [phone] (phone) [phone] (fax)  [place]

## 2021-04-23 DIAGNOSIS — K862 Cyst of pancreas: Secondary | ICD-10-CM | POA: Diagnosis not present

## 2021-04-23 DIAGNOSIS — C678 Malignant neoplasm of overlapping sites of bladder: Secondary | ICD-10-CM | POA: Diagnosis not present

## 2021-04-24 DIAGNOSIS — C678 Malignant neoplasm of overlapping sites of bladder: Secondary | ICD-10-CM | POA: Diagnosis not present

## 2021-05-01 ENCOUNTER — Other Ambulatory Visit: Payer: Self-pay | Admitting: Family Medicine

## 2021-05-01 DIAGNOSIS — M170 Bilateral primary osteoarthritis of knee: Secondary | ICD-10-CM

## 2021-05-01 NOTE — Telephone Encounter (Signed)
Requested medication (s) are due for refill today: yes  Requested medication (s) are on the active medication list: yes  Last refill:  04/07/21  Future visit scheduled: 07/27/21  Notes to clinic:  This medication can not be delegated, please assess.   Requested Prescriptions  Pending Prescriptions Disp Refills   oxyCODONE-acetaminophen (PERCOCET) 10-325 MG tablet 120 tablet 0    Sig: Take 1 tablet by mouth every 6 (six) hours as needed for pain.     Not Delegated - Analgesics:  Opioid Agonist Combinations Failed - 05/01/2021  5:15 PM      Failed - This refill cannot be delegated      Failed - Urine Drug Screen completed in last 360 days      Passed - Valid encounter within last 6 months    Recent Outpatient Visits           1 week ago Hypothyroidism, unspecified type   [facility] Birdie Sons, MD   2 months ago Viral upper respiratory tract infection   [facility] Mikey Kirschner, PA-C   3 months ago Arthritis of right knee   [facility] Birdie Sons, MD   4 months ago Pilonidal cyst   [facility] Birdie Sons, MD   6 months ago Acute deep vein thrombosis (DVT) of right femoral vein [facility])   [facility] Birdie Sons, MD       Future Appointments             In 2 months Fisher, [name], MD [facility], [place]

## 2021-05-01 NOTE — Telephone Encounter (Signed)
Medication Refill - Medication: oxyCODONE-acetaminophen (PERCOCET) 10-325 MG tablet   Has the patient contacted their pharmacy? Yes.    (Agent: If yes, when and what did the pharmacy advise?) Pt has to get pcp to refill Preferred Pharmacy (with phone number or street name):  [facility] #[id number] - [place], [place] [address] AT [place]  Phone:  [phone] Fax:  [phone]  Has the patient been seen for an appointment in the last year OR does the patient have an upcoming appointment? Yes.    Agent: Please be advised that RX refills may take up to 3 business days. We ask that you follow-up with your pharmacy.

## 2021-05-03 MED ORDER — OXYCODONE-ACETAMINOPHEN 10-325 MG PO TABS: 1.0000 | ORAL_TABLET | Freq: Four times a day (QID) | ORAL | 0 refills | Status: DC | PRN

## 2021-05-11 ENCOUNTER — Other Ambulatory Visit: Payer: Self-pay | Admitting: Family Medicine

## 2021-05-11 DIAGNOSIS — A6000 Herpesviral infection of urogenital system, unspecified: Secondary | ICD-10-CM

## 2021-05-21 ENCOUNTER — Ambulatory Visit: Payer: Self-pay | Admitting: *Deleted

## 2021-05-21 NOTE — Telephone Encounter (Signed)
He is defininately supposed to be taking the hctz for his blood pressure, we've been prescribing that for him since feb 2021.  It looks like the cardiologist prescribed the metoprolol for atrial flutter. he should contact the cardiology office to see if he is still supposed to be taking it.... it doesn't like it's been filled since September

## 2021-05-21 NOTE — Telephone Encounter (Signed)
Pt needs a FU as had switched to [facility] and states went to get meds and got Metroprofol and also hydrochlorothiazide.  Pt states never taken either.  He is not going to take till receives a cb but it does seem Metroprofol is not on his past or current pls fu as wants to go over current meds [phone]. Concerned    Attempted to reach pt, VM not set up.

## 2021-05-21 NOTE — Telephone Encounter (Signed)
Please advise whether patient is supposed to be taking these medications.

## 2021-05-21 NOTE — Telephone Encounter (Signed)
Pt. States when he picked up refills from his pharmacy they gave him Metoprolol and Hydrochlorothiazide. States he has never taken either medication. Hydrochlorothiazide on pt.'s list. Please advise pt.   Answer Assessment - Initial Assessment Questions 1. NAME of MEDICATION: "What medicine are you calling about?"     Hydrochlorothiazide and Metoprolol 2. QUESTION: "What is your question?" (e.g., double dose of medicine, side effect)     Is pt. Supposed to be on these medications? His pharmacy filled them and pt. Picked them up. States he has never taken either one. 3. PRESCRIBING HCP: "Who prescribed it?" Reason: if prescribed by specialist, call should be referred to that group.      4. SYMPTOMS: "Do you have any symptoms?"     N/a 5. SEVERITY: If symptoms are present, ask "Are they mild, moderate or severe?"     N/a 6. PREGNANCY:  "Is there any chance that you are pregnant?" "When was your last menstrual period?"     N/a  Protocols used: Medication Question Call-A-AH

## 2021-05-21 NOTE — Telephone Encounter (Signed)
Advised patient below, he states that he has not been taking HCTZ, and states that he will start taking medication daily and will be in touch with his cardiologist. Jerry Mcpherson

## 2021-05-22 ENCOUNTER — Ambulatory Visit: Payer: Self-pay

## 2021-05-22 NOTE — Telephone Encounter (Signed)
Pt stated he is on two medications PCP has asked him to start taking pt is unsure of medication names however, the medication is giving him heartburn.   Stated his symptoms may be due to a combination of all the medications he is taking he was nauseated last night and for half of the day today.   Pt is requesting nausea medication.   Seeking clinical advice.  Pt. States after taking the HCTZ yesterday he has heartburn all day yesterday and today. Asking id this is a side effect. Will try taking the medicine with food. Please advise pt.   Answer Assessment - Initial Assessment Questions 1. NAME of MEDICATION: "What medicine are you calling about?"     HCTZ 2. QUESTION: "What is your question?" (e.g., double dose of medicine, side effect)     Side effect - heartburn 3. PRESCRIBING HCP: "Who prescribed it?" Reason: if prescribed by specialist, call should be referred to that group.     Dr. Caryn Section 4. SYMPTOMS: "Do you have any symptoms?"     Heartburn, nausea 5. SEVERITY: If symptoms are present, ask "Are they mild, moderate or severe?"     Mild 6. PREGNANCY:  "Is there any chance that you are pregnant?" "When was your last menstrual period?"     N/a  Protocols used: Medication Question Call-A-AH

## 2021-05-23 NOTE — Telephone Encounter (Signed)
He's been taking hctz for year, it should start causing heartburn now. It's a common side of naprosyn. It could be a side effect of cyproheptadine which he was prescribed for his appetite.

## 2021-05-25 NOTE — Telephone Encounter (Addendum)
Patient advised and verbalized understanding. Patient wants to know if Dr. Caryn Section could send in a prescription to help with nausea. He states "Dr. Caryn Section has prescribed a nausea medication for me in the past". Please advise.   Pharmacy:[facility] [place], [place]

## 2021-05-26 MED ORDER — PROMETHAZINE HCL 12.5 MG PO TABS: 12.5000 mg | ORAL_TABLET | Freq: Three times a day (TID) | ORAL | 0 refills | Status: DC | PRN

## 2021-05-26 NOTE — Addendum Note (Signed)
Addended by: Lelon Huh E on: 05/26/2021 12:11 PM   Modules accepted: Orders

## 2021-06-01 ENCOUNTER — Other Ambulatory Visit: Payer: Self-pay | Admitting: Family Medicine

## 2021-06-01 DIAGNOSIS — M170 Bilateral primary osteoarthritis of knee: Secondary | ICD-10-CM

## 2021-06-01 NOTE — Telephone Encounter (Signed)
Medication Refill - Medication:  oxyCODONE-acetaminophen (PERCOCET) 10-325 MG tablet 120 tablet 0 05/03/2021    Sig - Route: Take 1 tablet by mouth every 6 (six) hours as needed for pain. - Oral   Sent to pharmacy as: oxyCODONE-acetaminophen (PERCOCET) 10-325     Has the patient contacted their pharmacy? Yes.   (Agent: If no, request that the patient contact the pharmacy for the refill. If patient does not wish to contact the pharmacy document the reason why and proceed with request.) (Agent: If yes, when and what did the pharmacy advise?) call dr  Preferred Pharmacy (with phone number or street name):  [facility] #[id number] [facility], [place] [address] AT [place]  [place] [address]  Phone: [phone] Fax: [phone]   Has the patient been seen for an appointment in the last year OR does the patient have an upcoming appointment? Yes.   04/21/21 nxt April, 23  Agent: Please be advised that RX refills may take up to 3 business days. We ask that you follow-up with your pharmacy.

## 2021-06-02 ENCOUNTER — Other Ambulatory Visit: Payer: Self-pay

## 2021-06-02 DIAGNOSIS — M170 Bilateral primary osteoarthritis of knee: Secondary | ICD-10-CM

## 2021-06-02 NOTE — Addendum Note (Signed)
Addended by: Doristine Devoid on: 06/02/2021 01:51 PM   Modules accepted: Orders

## 2021-06-02 NOTE — Telephone Encounter (Signed)
Copied from [place] #[id number]. Topic: General - Other >> Jun 02, 2021 11:13 AM Tessa Lerner A wrote: Reason for CRM: The patient would like to speak with a member of staff when possible   The patient would like to be notified when their oxyCODONE-acetaminophen (PERCOCET) 10-325 MG tablet [[id number]]  refill is submitted to the pharmacy   Please contact

## 2021-06-02 NOTE — Telephone Encounter (Signed)
Requested medication (s) are due for refill today: yes  Requested medication (s) are on the active medication list: yes  Last refill:  04/21/21  Future visit scheduled: 07/27/21  Notes to clinic:  This medication can not be delegated, please assess.     Requested Prescriptions  Pending Prescriptions Disp Refills   oxyCODONE-acetaminophen (PERCOCET) 10-325 MG tablet 120 tablet 0    Sig: Take 1 tablet by mouth every 6 (six) hours as needed for pain.     Not Delegated - Analgesics:  Opioid Agonist Combinations Failed - 06/01/2021 10:08 AM      Failed - This refill cannot be delegated      Failed - Urine Drug Screen completed in last 360 days      Passed - Valid encounter within last 3 months    Recent Outpatient Visits           1 month ago Hypothyroidism, unspecified type   [facility] Birdie Sons, MD   3 months ago Viral upper respiratory tract infection   [facility] Mikey Kirschner, PA-C   4 months ago Arthritis of right knee   [facility] Birdie Sons, MD   5 months ago Pilonidal cyst   [facility] Birdie Sons, MD   7 months ago Acute deep vein thrombosis (DVT) of right femoral vein [facility]   [facility] Birdie Sons, MD       Future Appointments             In 1 month Fisher, [name], MD [facility], [place]

## 2021-06-02 NOTE — Telephone Encounter (Signed)
LOV: 04/21/2021 NOV: 07/27/2021 Last Refill: 05/01/2021 qty:120 r:0

## 2021-06-03 MED ORDER — OXYCODONE-ACETAMINOPHEN 10-325 MG PO TABS: 1.0000 | ORAL_TABLET | Freq: Four times a day (QID) | ORAL | 0 refills | Status: DC | PRN

## 2021-06-18 DIAGNOSIS — Z1589 Genetic susceptibility to other disease: Secondary | ICD-10-CM | POA: Diagnosis not present

## 2021-06-18 DIAGNOSIS — D45 Polycythemia vera: Secondary | ICD-10-CM | POA: Diagnosis not present

## 2021-06-18 DIAGNOSIS — Z87891 Personal history of nicotine dependence: Secondary | ICD-10-CM | POA: Diagnosis not present

## 2021-06-18 DIAGNOSIS — I82401 Acute embolism and thrombosis of unspecified deep veins of right lower extremity: Secondary | ICD-10-CM | POA: Diagnosis not present

## 2021-06-18 DIAGNOSIS — H532 Diplopia: Secondary | ICD-10-CM | POA: Diagnosis not present

## 2021-06-18 DIAGNOSIS — Z79899 Other long term (current) drug therapy: Secondary | ICD-10-CM | POA: Diagnosis not present

## 2021-06-18 DIAGNOSIS — Z7901 Long term (current) use of anticoagulants: Secondary | ICD-10-CM | POA: Diagnosis not present

## 2021-06-22 ENCOUNTER — Other Ambulatory Visit: Payer: Self-pay | Admitting: Family Medicine

## 2021-07-01 ENCOUNTER — Other Ambulatory Visit: Payer: Self-pay | Admitting: Family Medicine

## 2021-07-01 DIAGNOSIS — M170 Bilateral primary osteoarthritis of knee: Secondary | ICD-10-CM

## 2021-07-01 NOTE — Telephone Encounter (Signed)
Medication Refill - Medication: oxyCODONE-acetaminophen (PERCOCET) 10-325 MG tablet ? ?Has the patient contacted their pharmacy? No. ?(Agent: If no, request that the patient contact the pharmacy for the refill. If patient does not wish to contact the pharmacy document the reason why and proceed with request.) ?(Agent: If yes, when and what did the pharmacy advise?) ? ?Preferred Pharmacy (with phone number or street name): [facility] #[id number] - [place], [place] [address] AT [place]  ?[place], [place] [address]  ?Phone:  [phone]  Fax:  [phone] ?Has the patient been seen for an appointment in the last year OR does the patient have an upcoming appointment? Yes.   ? ?Agent: Please be advised that RX refills may take up to 3 business days. We ask that you follow-up with your pharmacy. ?

## 2021-07-02 NOTE — Telephone Encounter (Signed)
Pt is calling back to follow up on the medication refill. Stated he has reached out to the pharmacy, and they do not have a refill for him yet.  ?Pt is out of the medication. ? ?Pt is requesting a call back. ? ?Please advise. ?

## 2021-07-02 NOTE — Telephone Encounter (Signed)
Pt is calling back on medication. Pt told to reach out to pharmacy this evening, Pt told that would document he called. ?

## 2021-07-03 MED ORDER — OXYCODONE-ACETAMINOPHEN 10-325 MG PO TABS: 1.0000 | ORAL_TABLET | Freq: Four times a day (QID) | ORAL | 0 refills | Status: DC | PRN

## 2021-07-03 NOTE — Telephone Encounter (Signed)
Patient checking on the status of medication refill, patient states is out and would like PCP to send in request today. Informed patient please allow 48 to 72 hour turn around time.  ? ? ?[facility] #[id number] [facility] [place], [place] [address] AT [place] Phone:  [phone]  ?Fax:  [phone]  ?  ? ?

## 2021-07-03 NOTE — Telephone Encounter (Signed)
Requested medication (s) are due for refill today: yes ? ?Requested medication (s) are on the active medication list: yes  ? ?Last refill:  06/03/21 #120 ? ?Future visit scheduled: yes ? ?Notes to clinic:  Please review for refill. Refill not delegated per protocol.  ? ? ? ?Requested Prescriptions  ?Pending Prescriptions Disp Refills  ? oxyCODONE-acetaminophen (PERCOCET) 10-325 MG tablet 120 tablet 0  ?  Sig: Take 1 tablet by mouth every 6 (six) hours as needed for pain.  ?  ? Not Delegated - Analgesics:  Opioid Agonist Combinations Failed - 07/02/2021  5:12 PM  ?  ?  Failed - This refill cannot be delegated  ?  ?  Failed - Urine Drug Screen completed in last 360 days  ?  ?  Passed - Valid encounter within last 3 months  ?  Recent Outpatient Visits   ? ?      ? 2 months ago Hypothyroidism, unspecified type  ? [facility] Birdie Sons, MD  ? 4 months ago Viral upper respiratory tract infection  ? [facility] Thedore Mins, Ria Comment, PA-C  ? 5 months ago Arthritis of right knee  ? [facility] Caryn Section, [name], MD  ? 6 months ago Pilonidal cyst  ? [facility] Birdie Sons, MD  ? 8 months ago Acute deep vein thrombosis (DVT) of right femoral vein ([place])  ? [facility] Caryn Section, [name], MD  ? ?  ?  ?Future Appointments   ? ?        ? In 3 weeks Fisher, [name], MD [facility]  ? ?  ? ?  ?  ?  ? ?

## 2021-07-09 ENCOUNTER — Encounter: Payer: Self-pay | Admitting: Family Medicine

## 2021-07-09 ENCOUNTER — Ambulatory Visit (INDEPENDENT_AMBULATORY_CARE_PROVIDER_SITE_OTHER): Payer: Medicare PPO | Admitting: Family Medicine

## 2021-07-09 VITALS — BP 133/87 | HR 80 | Temp 98.0°F | Resp 16 | Wt 161.4 lb

## 2021-07-09 DIAGNOSIS — D45 Polycythemia vera: Secondary | ICD-10-CM

## 2021-07-09 DIAGNOSIS — B9689 Other specified bacterial agents as the cause of diseases classified elsewhere: Secondary | ICD-10-CM | POA: Diagnosis not present

## 2021-07-09 DIAGNOSIS — J329 Chronic sinusitis, unspecified: Secondary | ICD-10-CM | POA: Diagnosis not present

## 2021-07-09 DIAGNOSIS — I4892 Unspecified atrial flutter: Secondary | ICD-10-CM

## 2021-07-09 MED ORDER — AMOXICILLIN-POT CLAVULANATE 875-125 MG PO TABS: 1.0000 | ORAL_TABLET | Freq: Two times a day (BID) | ORAL | 0 refills | Status: DC

## 2021-07-09 NOTE — Assessment & Plan Note (Signed)
>  1 week concern; worsening in past 48 hours ?Deep productive cough d/t drainage ?Soreness in maxillary sinuses ?Recommend treatment given current immunocompromised state ?Recommend COVID testing as well; despite lack of known exposure ?Patient prefers to do POC testing d/t prompt results vs send off here in office  ?

## 2021-07-09 NOTE — Assessment & Plan Note (Signed)
Chronic, stable ?HR normal 80 bpm ?Rhythm irregular ?Patient denies any cardiac complaints ?Followed by cardiology ?

## 2021-07-09 NOTE — Assessment & Plan Note (Signed)
Followed by Jerry Mcpherson; discussed kidney function if his home COVID test was positive and he needed treatment with an anti-viral. ?Recommend water as main source of fluid intake ?Patient made aware of elevation in creatinine >2 and decrease to GFR in 30s ?Pt did not know that his kidneys were not 'normal' ?

## 2021-07-09 NOTE — Progress Notes (Signed)
?  ?[employer] as a Education administrator for Gwyneth Sprout, FNP.,have documented all relevant documentation on the behalf of Gwyneth Sprout, FNP,as directed by  Gwyneth Sprout, FNP while in the presence of Gwyneth Sprout, FNP. ? ?Established patient visit ? ? ?Patient: Jerry Mcpherson   DOB: 03/19/1941   81 y.o. Male  MRN: [id number] ?Visit Date: 07/09/2021 ? ?Today's healthcare provider: Gwyneth Sprout, FNP  ?Introduced to Designer, jewellery role and practice setting.  All questions answered.  Discussed provider/patient relationship and expectations. ? ? ?Chief Complaint  ?Patient presents with  ? URI  ? ?Subjective  ?  ?HPI ?HPI   ? ? URI   ?Associated symptoms inlclude achiness, cough, diarrhea, ear pain, headache, jiont pain , plugged ear sensation, rhinorrhea, shortness of breath, sinus pain, sneezing, sore throat and swollen glands.  Recent episode started in the past 7 days.  The problem has been gradually worsening since onset.  The temperature has been with in normal range.  Patient  is drinking plenty of fluids. ? ?  ?  ?Last edited by Minette Headland, CMA on 07/09/2021  1:15 PM.  ?  ?  ? ?Medications: ?Outpatient Medications Prior to Visit  ?Medication Sig  ? acetaminophen (TYLENOL) 500 MG tablet Take 2 tablets (1,000 mg total) by mouth every 6 (six) hours as needed for mild pain or moderate pain.  ? amLODipine (NORVASC) 2.5 MG tablet TAKE ONE (1) TABLET BY MOUTH ONCE DAILY  ? Ascorbic Acid (VITAMIN C) 1000 MG tablet Take 2 tablets by mouth daily.  ? aspirin 81 MG tablet Take 2 tablets daily  ? budesonide-formoterol (SYMBICORT) 160-4.5 MCG/ACT inhaler Inhale 2 puffs into the lungs 2 (two) times daily.  ? cyproheptadine (PERIACTIN) 4 MG tablet TAKE 1/2-1 TABLET BY MOUTH THREE TIMES DAILY AS NEEDED TO HELP APPETITE  ? diclofenac Sodium (VOLTAREN) 1 % GEL Apply 4 g topically 4 (four) times daily as needed.  ? docusate sodium (COLACE) 100 MG capsule Take 1 capsule by mouth daily as needed.  ? ELIQUIS 5  MG TABS tablet Take 1 tablet (5 mg total) by mouth 2 (two) times daily.  ? fluticasone (FLONASE) 50 MCG/ACT nasal spray Place 2 sprays into both nostrils daily.  ? hydrochlorothiazide (HYDRODIURIL) 25 MG tablet Take 1 tablet (25 mg total) by mouth daily.  ? hydroxyurea (HYDREA) 500 MG capsule Take 500 mg by mouth as directed. 2 tablets 3 days a week, and 1 tablet all other days  ? levothyroxine (SYNTHROID) 75 MCG tablet TAKE (1) TABLET BY MOUTH EVERY DAY  ? Multiple Vitamins-Minerals (MULTIVITAMIN ADULT PO) Take 1 tablet by mouth daily.  ? naproxen (NAPROSYN) 500 MG tablet TAKE 1 TABLET(500 MG) BY MOUTH TWICE DAILY WITH A MEAL  ? oxyCODONE-acetaminophen (PERCOCET) 10-325 MG tablet Take 1 tablet by mouth every 6 (six) hours as needed for pain.  ? potassium chloride (KLOR-CON) 10 MEQ tablet Take by mouth.  ? promethazine (PHENERGAN) 12.5 MG tablet TAKE 1 TO 2 TABLETS(12.5 TO 25 MG) BY MOUTH EVERY 8 HOURS AS NEEDED FOR NAUSEA OR VOMITING  ? sildenafil (VIAGRA) 50 MG tablet Take 0.5-1 tablets by mouth daily as needed.  ? valACYclovir (VALTREX) 1000 MG tablet TAKE 1 TABLET(1000 MG) BY MOUTH TWICE DAILY AS NEEDED  ? metoprolol succinate (TOPROL-XL) 50 MG 24 hr tablet Take 50 mg by mouth daily.  ? ?No facility-administered medications prior to visit.  ? ? ?Review of Systems ? ? ?  Objective  ?  ?  BP 133/87   Pulse 80   Temp 98 ?F (36.7 ?C) (Oral)   Resp 16   SpO2 98%  ? ? ?Physical Exam ?Vitals and nursing note reviewed.  ?Constitutional:   ?   General: He is not in acute distress. ?   Appearance: Normal appearance. He is normal weight. He is not ill-appearing, toxic-appearing or diaphoretic.  ?HENT:  ?   Head: Normocephalic and atraumatic.  ?Eyes:  ?   Pupils: Pupils are equal, round, and reactive to light.  ?Cardiovascular:  ?   Rate and Rhythm: Normal rate. Rhythm irregular.  ?   Pulses: Normal pulses.  ?   Heart sounds: Normal heart sounds.  ?Pulmonary:  ?   Effort: Pulmonary effort is normal.  ?   Breath sounds:  Normal breath sounds.  ?Musculoskeletal:     ?   General: Normal range of motion.  ?   Cervical back: Normal range of motion.  ?Skin: ?   General: Skin is warm and dry.  ?   Capillary Refill: Capillary refill takes less than 2 seconds.  ?Neurological:  ?   General: No focal deficit present.  ?   Mental Status: He is alert and oriented to person, place, and time. Mental status is at baseline.  ?  ? ?No results found for any visits on 07/09/21. ? Assessment & Plan  ?  ? ?Problem List Items Addressed This Visit   ? ?  ? Cardiovascular and Mediastinum  ? Atrial flutter ([place])  ?  Chronic, stable ?HR normal 80 bpm ?Rhythm irregular ?Patient denies any cardiac complaints ?Followed by cardiology ?  ?  ? Relevant Medications  ? metoprolol succinate (TOPROL-XL) 50 MG 24 hr tablet  ?  ? Respiratory  ? Bacterial sinusitis - Primary  ?  >1 week concern; worsening in past 48 hours ?Deep productive cough d/t drainage ?Soreness in maxillary sinuses ?Recommend treatment given current immunocompromised state ?Recommend COVID testing as well; despite lack of known exposure ?Patient prefers to do POC testing d/t prompt results vs send off here in office  ?  ?  ? Relevant Medications  ? amoxicillin-clavulanate (AUGMENTIN) 875-125 MG tablet  ?  ? Other  ? Polycythemia vera ([place])  ?  Followed by Terald Sleeper; discussed kidney function if his home COVID test was positive and he needed treatment with an anti-viral. ?Recommend water as main source of fluid intake ?Patient made aware of elevation in creatinine >2 and decrease to GFR in 30s ?Pt did not know that his kidneys were not 'normal' ?  ?  ? Relevant Medications  ? amoxicillin-clavulanate (AUGMENTIN) 875-125 MG tablet  ? ? ?Return if symptoms worsen or fail to improve, for  positive COVID test.  ?   ? ?I, Gwyneth Sprout, FNP, have reviewed all documentation for this visit. The documentation on 07/09/21 for the exam, diagnosis, procedures, and orders are all accurate and complete. ? ? ?Gwyneth Sprout, FNP  ?[place] ?[phone] (phone) ?[phone] (fax) ? ?[facility] ?

## 2021-07-10 ENCOUNTER — Telehealth: Payer: Self-pay

## 2021-07-10 NOTE — Telephone Encounter (Signed)
Pt called in, was asking about medication needed for enzymes for kidneys. He states yesterday he understood that is kidney function was too low and needs an enzyme medication to help bring this back up. Reviewed labs and from 06/18/21 his KF was elevated and advised him that his LF was low and maybe that's where he misunderstood but OV note from [place], NP yesterday didn't state anything about medication for kidneys or liver. Advised pt of this and he states that he would contact his dr at [facility]. No further questions noted.  ?

## 2021-07-10 NOTE — Telephone Encounter (Signed)
Patient called in to inquire of Jerry Mcpherson about the medication for his Kidney enzymes. Ph# [phone]  ?

## 2021-07-10 NOTE — Telephone Encounter (Signed)
Copied from [place] [phone]. Topic: General - Other ?>> Jul 09, 2021  4:17 PM Alanda Slim E wrote: ?Reason for CRM: Pt was to call Daneil Dan and let him know what his covid test results were/ pt stated they came back as negative / so he was needing another RX / please advise ?>> Jul 10, 2021  8:29 AM McGill, Nelva Bush wrote: ?Pt is calling back to follow up. Pt stated he called the pharmacy yesterday night and they had nothing for him.  ?

## 2021-07-11 DIAGNOSIS — Z5900 Homelessness unspecified: Secondary | ICD-10-CM | POA: Diagnosis not present

## 2021-07-11 DIAGNOSIS — I10 Essential (primary) hypertension: Secondary | ICD-10-CM | POA: Diagnosis not present

## 2021-07-11 DIAGNOSIS — D84821 Immunodeficiency due to drugs: Secondary | ICD-10-CM | POA: Diagnosis not present

## 2021-07-11 DIAGNOSIS — C679 Malignant neoplasm of bladder, unspecified: Secondary | ICD-10-CM | POA: Diagnosis not present

## 2021-07-11 DIAGNOSIS — E039 Hypothyroidism, unspecified: Secondary | ICD-10-CM | POA: Diagnosis not present

## 2021-07-11 DIAGNOSIS — C7951 Secondary malignant neoplasm of bone: Secondary | ICD-10-CM | POA: Diagnosis not present

## 2021-07-11 DIAGNOSIS — Z936 Other artificial openings of urinary tract status: Secondary | ICD-10-CM | POA: Diagnosis not present

## 2021-07-11 DIAGNOSIS — I739 Peripheral vascular disease, unspecified: Secondary | ICD-10-CM | POA: Diagnosis not present

## 2021-07-11 DIAGNOSIS — D45 Polycythemia vera: Secondary | ICD-10-CM | POA: Diagnosis not present

## 2021-07-23 ENCOUNTER — Ambulatory Visit (INDEPENDENT_AMBULATORY_CARE_PROVIDER_SITE_OTHER): Payer: Medicare PPO

## 2021-07-23 VITALS — Wt 161.0 lb

## 2021-07-23 DIAGNOSIS — Z0000 Encounter for general adult medical examination without abnormal findings: Secondary | ICD-10-CM | POA: Diagnosis not present

## 2021-07-23 NOTE — Patient Instructions (Signed)
Mr. Heitzenrater , ?Thank you for taking time to come for your Medicare Wellness Visit. I appreciate your ongoing commitment to your health goals. Please review the following plan we discussed and let me know if I can assist you in the future.  ? ?Screening recommendations/referrals: ?Colonoscopy: AGED OUT ?Recommended yearly ophthalmology/optometry visit for glaucoma screening and checkup ?Recommended yearly dental visit for hygiene and checkup ? ?Vaccinations: ?Influenza vaccine: 01/23/21 ?Pneumococcal vaccine: 12/31/13 ?Tdap vaccine: 10/28/11 ?Shingles vaccine: ZOSTAVAX 01/28/09   ?Covid-19: 05/26/19, 06/23/19 ? ?Advanced directives: NO ? ?Conditions/risks identified: NONE ? ?Next appointment: Follow up in one year for your annual wellness visit. - DECLINED, HAS LUNG CANCER ? ?Preventive Care 83 Years and Older, Male ?Preventive care refers to lifestyle choices and visits with your health care provider that can promote health and wellness. ?What does preventive care include? ?A yearly physical exam. This is also called an annual well check. ?Dental exams once or twice a year. ?Routine eye exams. Ask your health care provider how often you should have your eyes checked. ?Personal lifestyle choices, including: ?Daily care of your teeth and gums. ?Regular physical activity. ?Eating a healthy diet. ?Avoiding tobacco and drug use. ?Limiting alcohol use. ?Practicing safe sex. ?Taking low doses of aspirin every day. ?Taking vitamin and mineral supplements as recommended by your health care provider. ?What happens during an annual well check? ?The services and screenings done by your health care provider during your annual well check will depend on your age, overall health, lifestyle risk factors, and family history of disease. ?Counseling  ?Your health care provider may ask you questions about your: ?Alcohol use. ?Tobacco use. ?Drug use. ?Emotional well-being. ?Home and relationship well-being. ?Sexual activity. ?Eating  habits. ?History of falls. ?Memory and ability to understand (cognition). ?Work and work Statistician. ?Screening  ?You may have the following tests or measurements: ?Height, weight, and BMI. ?Blood pressure. ?Lipid and cholesterol levels. These may be checked every 5 years, or more frequently if you are over 79 years old. ?Skin check. ?Lung cancer screening. You may have this screening every year starting at age 83 if you have a 30-pack-year history of smoking and currently smoke or have quit within the past 15 years. ?Fecal occult blood test (FOBT) of the stool. You may have this test every year starting at age 83. ?Flexible sigmoidoscopy or colonoscopy. You may have a sigmoidoscopy every 5 years or a colonoscopy every 10 years starting at age 83. ?Prostate cancer screening. Recommendations will vary depending on your family history and other risks. ?Hepatitis C blood test. ?Hepatitis B blood test. ?Sexually transmitted disease (STD) testing. ?Diabetes screening. This is done by checking your blood sugar (glucose) after you have not eaten for a while (fasting). You may have this done every 1-3 years. ?Abdominal aortic aneurysm (AAA) screening. You may need this if you are a current or former smoker. ?Osteoporosis. You may be screened starting at age 83 if you are at high risk. ?Talk with your health care provider about your test results, treatment options, and if necessary, the need for more tests. ?Vaccines  ?Your health care provider may recommend certain vaccines, such as: ?Influenza vaccine. This is recommended every year. ?Tetanus, diphtheria, and acellular pertussis (Tdap, Td) vaccine. You may need a Td booster every 10 years. ?Zoster vaccine. You may need this after age 54. ?Pneumococcal 13-valent conjugate (PCV13) vaccine. One dose is recommended after age 20. ?Pneumococcal polysaccharide (PPSV23) vaccine. One dose is recommended after age 80. ?Talk to your health care  provider about which screenings and  vaccines you need and how often you need them. ?This information is not intended to replace advice given to you by your health care provider. Make sure you discuss any questions you have with your health care provider. ?Document Released: 04/25/2015 Document Revised: 12/17/2015 Document Reviewed: 01/28/2015 ?Elsevier Interactive Patient Education ? 2017 [place]. ? ?Fall Prevention in the Home ?Falls can cause injuries. They can happen to people of all ages. There are many things you can do to make your home safe and to help prevent falls. ?What can I do on the outside of my home? ?Regularly fix the edges of walkways and driveways and fix any cracks. ?Remove anything that might make you trip as you walk through a door, such as a raised step or threshold. ?Trim any bushes or trees on the path to your home. ?Use bright outdoor lighting. ?Clear any walking paths of anything that might make someone trip, such as rocks or tools. ?Regularly check to see if handrails are loose or broken. Make sure that both sides of any steps have handrails. ?Any raised decks and porches should have guardrails on the edges. ?Have any leaves, snow, or ice cleared regularly. ?Use sand or salt on walking paths during winter. ?Clean up any spills in your garage right away. This includes oil or grease spills. ?What can I do in the bathroom? ?Use night lights. ?Install grab bars by the toilet and in the tub and shower. Do not use towel bars as grab bars. ?Use non-skid mats or decals in the tub or shower. ?If you need to sit down in the shower, use a plastic, non-slip stool. ?Keep the floor dry. Clean up any water that spills on the floor as soon as it happens. ?Remove soap buildup in the tub or shower regularly. ?Attach bath mats securely with double-sided non-slip rug tape. ?Do not have throw rugs and other things on the floor that can make you trip. ?What can I do in the bedroom? ?Use night lights. ?Make sure that you have a light by your  bed that is easy to reach. ?Do not use any sheets or blankets that are too big for your bed. They should not hang down onto the floor. ?Have a firm chair that has side arms. You can use this for support while you get dressed. ?Do not have throw rugs and other things on the floor that can make you trip. ?What can I do in the kitchen? ?Clean up any spills right away. ?Avoid walking on wet floors. ?Keep items that you use a lot in easy-to-reach places. ?If you need to reach something above you, use a strong step stool that has a grab bar. ?Keep electrical cords out of the way. ?Do not use floor polish or wax that makes floors slippery. If you must use wax, use non-skid floor wax. ?Do not have throw rugs and other things on the floor that can make you trip. ?What can I do with my stairs? ?Do not leave any items on the stairs. ?Make sure that there are handrails on both sides of the stairs and use them. Fix handrails that are broken or loose. Make sure that handrails are as long as the stairways. ?Check any carpeting to make sure that it is firmly attached to the stairs. Fix any carpet that is loose or worn. ?Avoid having throw rugs at the top or bottom of the stairs. If you do have throw rugs, attach them to the  floor with carpet tape. ?Make sure that you have a light switch at the top of the stairs and the bottom of the stairs. If you do not have them, ask someone to add them for you. ?What else can I do to help prevent falls? ?Wear shoes that: ?Do not have high heels. ?Have rubber bottoms. ?Are comfortable and fit you well. ?Are closed at the toe. Do not wear sandals. ?If you use a stepladder: ?Make sure that it is fully opened. Do not climb a closed stepladder. ?Make sure that both sides of the stepladder are locked into place. ?Ask someone to hold it for you, if possible. ?Clearly mark and make sure that you can see: ?Any grab bars or handrails. ?First and last steps. ?Where the edge of each step is. ?Use tools that  help you move around (mobility aids) if they are needed. These include: ?Canes. ?Walkers. ?Scooters. ?Crutches. ?Turn on the lights when you go into a dark area. Replace any light bulbs as soon as they burn ou

## 2021-07-23 NOTE — Progress Notes (Signed)
?Virtual Visit via Telephone Note ? ?I connected with  Jerry Mcpherson on 07/23/21 at  8:45 AM EDT by telephone and verified that I am speaking with the correct person using two identifiers. ? ?Location: ?Patient: home ?Provider: [facility] ?Persons participating in the virtual visit: patient/Nurse Health Advisor ?  ?I discussed the limitations, risks, security and privacy concerns of performing an evaluation and management service by telephone and the availability of in person appointments. The patient expressed understanding and agreed to proceed. ? ?Interactive audio and video telecommunications were attempted between this nurse and patient, however failed, due to patient having technical difficulties OR patient did not have access to video capability.  We continued and completed visit with audio only. ? ?Some vital signs may be absent or patient reported.  ? ?Jerry David, LPN ? ?Subjective:  ? Jerry Mcpherson is a 81 y.o. male who presents for Medicare Annual/Subsequent preventive examination. ? ?Review of Systems    ? ?  ? ?   ?Objective:  ?  ?There were no vitals filed for this visit. ?There is no height or weight on file to calculate BMI. ? ? ?  07/17/2020  ?  9:16 AM 07/16/2019  ?  3:02 PM 08/26/2017  ? 10:10 AM 08/24/2016  ?  1:27 PM 08/24/2016  ?  1:21 PM  ?Advanced Directives  ?Does Patient Have a Medical Advance Directive? No No No No No  ?Would patient like information on creating a medical advance directive? No - Patient declined No - Patient declined No - Patient declined    ? ? ?Current Medications (verified) ?Outpatient Encounter Medications as of 07/23/2021  ?Medication Sig  ? acetaminophen (TYLENOL) 500 MG tablet Take 2 tablets (1,000 mg total) by mouth every 6 (six) hours as needed for mild pain or moderate pain.  ? amLODipine (NORVASC) 2.5 MG tablet TAKE ONE (1) TABLET BY MOUTH ONCE DAILY  ? amoxicillin-clavulanate (AUGMENTIN) 875-125 MG tablet Take 1 tablet by mouth 2 (two) times daily.  ?  Ascorbic Acid (VITAMIN C) 1000 MG tablet Take 2 tablets by mouth daily.  ? aspirin 81 MG tablet Take 2 tablets daily  ? budesonide-formoterol (SYMBICORT) 160-4.5 MCG/ACT inhaler Inhale 2 puffs into the lungs 2 (two) times daily.  ? cyproheptadine (PERIACTIN) 4 MG tablet TAKE 1/2-1 TABLET BY MOUTH THREE TIMES DAILY AS NEEDED TO HELP APPETITE  ? diclofenac Sodium (VOLTAREN) 1 % GEL Apply 4 g topically 4 (four) times daily as needed.  ? docusate sodium (COLACE) 100 MG capsule Take 1 capsule by mouth daily as needed.  ? ELIQUIS 5 MG TABS tablet Take 1 tablet (5 mg total) by mouth 2 (two) times daily.  ? fluticasone (FLONASE) 50 MCG/ACT nasal spray Place 2 sprays into both nostrils daily.  ? hydrochlorothiazide (HYDRODIURIL) 25 MG tablet Take 1 tablet (25 mg total) by mouth daily.  ? hydroxyurea (HYDREA) 500 MG capsule Take 500 mg by mouth as directed. 2 tablets 3 days a week, and 1 tablet all other days  ? levothyroxine (SYNTHROID) 75 MCG tablet TAKE (1) TABLET BY MOUTH EVERY DAY  ? metoprolol succinate (TOPROL-XL) 50 MG 24 hr tablet Take 50 mg by mouth daily.  ? Multiple Vitamins-Minerals (MULTIVITAMIN ADULT PO) Take 1 tablet by mouth daily.  ? naproxen (NAPROSYN) 500 MG tablet TAKE 1 TABLET(500 MG) BY MOUTH TWICE DAILY WITH A MEAL  ? oxyCODONE-acetaminophen (PERCOCET) 10-325 MG tablet Take 1 tablet by mouth every 6 (six) hours as needed for pain.  ? potassium  chloride (KLOR-CON) 10 MEQ tablet Take by mouth.  ? promethazine (PHENERGAN) 12.5 MG tablet TAKE 1 TO 2 TABLETS(12.5 TO 25 MG) BY MOUTH EVERY 8 HOURS AS NEEDED FOR NAUSEA OR VOMITING  ? sildenafil (VIAGRA) 50 MG tablet Take 0.5-1 tablets by mouth daily as needed.  ? valACYclovir (VALTREX) 1000 MG tablet TAKE 1 TABLET(1000 MG) BY MOUTH TWICE DAILY AS NEEDED  ? ?No facility-administered encounter medications on file as of 07/23/2021.  ? ? ?Allergies (verified) ?Losartan potassium, Shellfish allergy, Ultram  [tramadol hcl], Celecoxib, Codeine, Cyclobenzaprine, and  Tramadol hcl  ? ?History: ?Past Medical History:  ?Diagnosis Date  ? Bladder cancer ([place])   ? Genital herpes   ? Hyperglycemia   ? Hypertension   ? Squamous cell carcinoma   ? ?Past Surgical History:  ?Procedure Laterality Date  ? CATARACT EXTRACTION Left 10/2012  ? Dr. Murvin Natal; [facility]  ? CYSTECTOMY W/ URETEROILEAL CONDUIT  07/24/2020  ? [place] for Stage IV bladder cancer extending to prostate  ? EYE SURGERY Left   ? removed a piece of steel  ? HERNIA REPAIR  17/49/4496  ? umbilical hernia, incarderated; Dr. Bary Castilla  ? PROSTATECTOMY  4/14/2-22  ? [place] for Stage IV bladder cancer extending to prostate  ? ROTATOR CUFF REPAIR    ? SQUAMOUS CELL CARCINOMA EXCISION  2008  ? TONSILLECTOMY    ? ?Family History  ?Problem Relation Age of Onset  ? Hypertension Mother   ? Heart attack Mother   ? Stroke Father   ? Cancer Father   ? COPD Sister   ? Congestive Heart Failure Brother   ? ?Social History  ? ?Socioeconomic History  ? Marital status: Divorced  ?  Spouse name: Not on file  ? Number of children: 1  ? Years of education: Not on file  ? Highest education level: Some college, no degree  ?Occupational History  ? Occupation: Retired  ? Occupation: Loss adjuster, chartered part time  ?Tobacco Use  ? Smoking status: Former  ?  Packs/day: 3.00  ?  Years: 15.00  ?  Pack years: 45.00  ?  Types: Cigarettes  ?  Quit date: 04/12/1968  ?  Years since quitting: 53.3  ? Smokeless tobacco: Never  ?Vaping Use  ? Vaping Use: Never used  ?Substance and Sexual Activity  ? Alcohol use: No  ?  Alcohol/week: 0.0 standard drinks  ? Drug use: No  ? Sexual activity: Not on file  ?Other Topics Concern  ? Not on file  ?Social History Narrative  ? Not on file  ? ?Social Determinants of Health  ? ?Financial Resource Strain: Not on file  ?Food Insecurity: Not on file  ?Transportation Needs: Not on file  ?Physical Activity: Not on file  ?Stress: Not on file  ?Social Connections: Not on file  ? ? ?Tobacco Counseling ?Counseling given: Not  Answered ? ? ?Clinical Intake: ? ?Pre-visit preparation completed: Yes ? ?Pain : No/denies pain ? ?  ? ?Nutritional Risks: None ?Diabetes: No ? ?How often do you need to have someone help you when you read instructions, pamphlets, or other written materials from your doctor or pharmacy?: 1 - Never ? ?Diabetic?no ? ?Interpreter Needed?: No ? ?Information entered by :: Kirke Shaggy, LPN ? ? ?Activities of Daily Living ?   ? View : No data to display.  ?  ?  ?  ? ? ?Patient Care Team: ?Birdie Sons, MD as PCP - General (Family Medicine) ?O'Branski, Cleophus Molt, PA (Oncology) ?System, Provider  Not In ?Ellen Henri, MD (Urology) ?Hoimes, Gwenyth Allegra, DO as Referring Physician (Hematology and Oncology) ?[place], [place], [place] Mc[place] as Education officer, museum ? ?Indicate any recent Medical Services you may have received from other than [facility] providers in the past year (date may be approximate). ? ?   ?Assessment:  ? This is a routine wellness examination for Jerry Mcpherson. ? ?Hearing/Vision screen ?No results found. ? ?Dietary issues and exercise activities discussed: ?  ? ? Goals Addressed   ?None ?  ? ?Depression Screen ? ?  07/17/2020  ?  9:13 AM 01/09/2020  ?  1:44 PM 02/02/2019  ? 11:44 AM 08/26/2017  ? 10:23 AM 08/26/2017  ? 10:11 AM 08/24/2016  ?  1:21 PM 08/24/2016  ?  1:20 PM  ?PHQ 2/9 Scores  ?PHQ - 2 Score 0 0 0 0 0 0 0  ?PHQ- 9 Score  1  0  0   ?  ?Fall Risk ? ?  07/17/2020  ?  9:17 AM 01/09/2020  ?  1:45 PM 07/16/2019  ?  3:03 PM 02/02/2019  ? 11:03 AM 08/26/2017  ? 10:11 AM  ?Fall Risk   ?Falls in the past year? 0 1 0 0 No  ?Number falls in past yr: 0 0 0 0   ?Injury with Fall? 0 1 0 0   ? ? ?FALL RISK PREVENTION PERTAINING TO THE HOME: ? ?Any stairs in or around the home? No  ?If so, are there any without handrails? No  ?Home free of loose throw rugs in walkways, pet beds, electrical cords, etc? Yes  ?Adequate lighting in your home to reduce risk of falls? Yes  ? ?ASSISTIVE DEVICES UTILIZED TO PREVENT FALLS: ? ?Life alert? No   ?Use of a cane, walker or w/c? Yes  ?Grab bars in the bathroom? No  ?Shower chair or bench in shower? No  ?Elevated toilet seat or a handicapped toilet? No  ? ?Cognitive Function: ?  ?  ? ?  08/26/2017  ? 10:16 AM 5/15/

## 2021-07-27 ENCOUNTER — Encounter: Payer: Self-pay | Admitting: Family Medicine

## 2021-07-27 ENCOUNTER — Ambulatory Visit (INDEPENDENT_AMBULATORY_CARE_PROVIDER_SITE_OTHER): Payer: Medicare PPO | Admitting: Family Medicine

## 2021-07-27 VITALS — BP 124/74 | HR 69 | Temp 97.5°F | Resp 16 | Wt 164.0 lb

## 2021-07-27 DIAGNOSIS — I483 Typical atrial flutter: Secondary | ICD-10-CM | POA: Diagnosis not present

## 2021-07-27 DIAGNOSIS — E039 Hypothyroidism, unspecified: Secondary | ICD-10-CM | POA: Diagnosis not present

## 2021-07-27 DIAGNOSIS — D45 Polycythemia vera: Secondary | ICD-10-CM

## 2021-07-27 NOTE — Progress Notes (Signed)
?  ? ? ?I,Roshena L Chambers,acting as a scribe for Lelon Huh, MD.,have documented all relevant documentation on the behalf of Lelon Huh, MD,as directed by  Lelon Huh, MD while in the presence of Lelon Huh, MD.  ? ?Established patient visit ? ? ?Patient: Jerry Mcpherson   DOB: 13-Jun-1940   81 y.o. Male  MRN: [id number] ?Visit Date: 07/27/2021 ? ?Today's healthcare provider: Lelon Huh, MD  ? ?Chief Complaint  ?Patient presents with  ? Hypothyroidism  ? Cough  ? ?Subjective  ?  ?HPI  ?Hypothyroid, follow-up ? ?Lab Results  ?Component Value Date  ? TSH 6.710 (H) 01/23/2021  ? TSH 4.570 (H) 08/02/2018  ? FREET4 1.14 08/02/2018  ? ? ?Wt Readings from Last 3 Encounters:  ?07/23/21 161 lb (73 kg)  ?07/09/21 161 lb 6.4 oz (73.2 kg)  ?04/21/21 160 lb (72.6 kg)  ? ? ?He was last seen for hypothyroid 3 months ago.  ?Management since that visit includes restarting levothyroxine (SYNTHROID) 75 MCG tablet; TAKE (1) TABLET BY MOUTH EVERY DAY. ?He reports good compliance with treatment. ?He is not having side effects.  ? ?Symptoms: ?Yes change in energy level No constipation  ?No diarrhea No heat / cold intolerance  ?No nervousness No palpitations  ?No weight changes   ? ?-----------------------------------------------------------------------------------------  ?Follow up for cough: ? ?The patient was last seen in the office on 07/09/2021 for bacterial sinusitis.  ?During that visit patient was prescribed Augmentin. ? ?He reports good compliance with treatment. ?He feels that condition is Improved. Patient still has a deep productive cough with white sputum. He denies any shortness of breath.  ?He is not having side effects.  ? ?-----------------------------------------------------------------------------------------  ? ?Medications: ?Outpatient Medications Prior to Visit  ?Medication Sig  ? acetaminophen (TYLENOL) 500 MG tablet Take 2 tablets (1,000 mg total) by mouth every 6 (six) hours as needed for mild  pain or moderate pain.  ? amLODipine (NORVASC) 2.5 MG tablet TAKE ONE (1) TABLET BY MOUTH ONCE DAILY  ? amoxicillin-clavulanate (AUGMENTIN) 875-125 MG tablet Take 1 tablet by mouth 2 (two) times daily. (Patient not taking: Reported on 07/23/2021)  ? Ascorbic Acid (VITAMIN C) 1000 MG tablet Take 2 tablets by mouth daily.  ? aspirin 81 MG tablet Take 2 tablets daily (Patient not taking: Reported on 07/23/2021)  ? budesonide-formoterol (SYMBICORT) 160-4.5 MCG/ACT inhaler Inhale 2 puffs into the lungs 2 (two) times daily. (Patient not taking: Reported on 07/23/2021)  ? cyproheptadine (PERIACTIN) 4 MG tablet TAKE 1/2-1 TABLET BY MOUTH THREE TIMES DAILY AS NEEDED TO HELP APPETITE  ? diclofenac Sodium (VOLTAREN) 1 % GEL Apply 4 g topically 4 (four) times daily as needed.  ? docusate sodium (COLACE) 100 MG capsule Take 1 capsule by mouth daily as needed.  ? ELIQUIS 5 MG TABS tablet Take 1 tablet (5 mg total) by mouth 2 (two) times daily.  ? fluticasone (FLONASE) 50 MCG/ACT nasal spray Place 2 sprays into both nostrils daily. (Patient not taking: Reported on 07/23/2021)  ? hydrochlorothiazide (HYDRODIURIL) 25 MG tablet Take 1 tablet (25 mg total) by mouth daily.  ? hydroxyurea (HYDREA) 500 MG capsule Take 500 mg by mouth as directed. 2 tablets 3 days a week, and 1 tablet all other days  ? levothyroxine (SYNTHROID) 75 MCG tablet TAKE (1) TABLET BY MOUTH EVERY DAY  ? metoprolol succinate (TOPROL-XL) 50 MG 24 hr tablet Take 50 mg by mouth daily.  ? Multiple Vitamins-Minerals (MULTIVITAMIN ADULT PO) Take 1 tablet by mouth daily.  ?  naproxen (NAPROSYN) 500 MG tablet TAKE 1 TABLET(500 MG) BY MOUTH TWICE DAILY WITH A MEAL  ? oxyCODONE-acetaminophen (PERCOCET) 10-325 MG tablet Take 1 tablet by mouth every 6 (six) hours as needed for pain.  ? potassium chloride (KLOR-CON) 10 MEQ tablet Take by mouth.  ? promethazine (PHENERGAN) 12.5 MG tablet TAKE 1 TO 2 TABLETS(12.5 TO 25 MG) BY MOUTH EVERY 8 HOURS AS NEEDED FOR NAUSEA OR VOMITING  (Patient not taking: Reported on 07/23/2021)  ? sildenafil (VIAGRA) 50 MG tablet Take 0.5-1 tablets by mouth daily as needed.  ? valACYclovir (VALTREX) 1000 MG tablet TAKE 1 TABLET(1000 MG) BY MOUTH TWICE DAILY AS NEEDED  ? ?No facility-administered medications prior to visit.  ? ? ?Review of Systems  ?Constitutional:  Negative for appetite change, chills and fever.  ?Respiratory:  Positive for cough. Negative for chest tightness, shortness of breath and wheezing.   ?Cardiovascular:  Negative for chest pain and palpitations.  ?Gastrointestinal:  Negative for abdominal pain, nausea and vomiting.  ? ? ?  Objective  ?  ?BP 124/74 (BP Location: Right Arm, Patient Position: Sitting, Cuff Size: Normal)   Pulse 69   Temp (!) 97.5 ?F (36.4 ?C) (Oral)   Resp 16   Wt 164 lb (74.4 kg)   SpO2 97%   BMI 24.94 kg/m?  ? ? ?Physical Exam  ? ?General: Appearance:    Well developed, well nourished male in no acute distress  ?Eyes:    PERRL, conjunctiva/corneas clear, EOM's intact       ?Lungs:     Clear to auscultation bilaterally, respirations unlabored  ?Heart:     Irregularly irregular rhythm. Normal rate. Normal rhythm. No murmurs, rubs, or gallops.    ?MS:   All extremities are intact.    ?Neurologic:   Awake, alert, oriented x 3. No apparent focal neurological defect.   ?   ?  ? Assessment & Plan  ?  ? ?1. Hypothyroidism, unspecified type ?Doing well with current thyroid replacement.  ?- T4, free ?- TSH ? ?2. Typical atrial flutter ([place]) ?On DOAC. Rate well controlled. Continue current medications.   ? ?3. Polycythemia vera ([place]) ?Continue on hydroxyurea managed at [facility] hematology  ?   ? ?The entirety of the information documented in the History of Present Illness, Review of Systems and Physical Exam were personally obtained by me. Portions of this information were initially documented by the CMA and reviewed by me for thoroughness and accuracy.   ? ? ?Lelon Huh, MD  ?[facility] ?[phone]  (phone) ?[phone] (fax) ? ?[facility]  ?

## 2021-07-28 DIAGNOSIS — R739 Hyperglycemia, unspecified: Secondary | ICD-10-CM | POA: Diagnosis not present

## 2021-07-28 DIAGNOSIS — R197 Diarrhea, unspecified: Secondary | ICD-10-CM | POA: Diagnosis not present

## 2021-07-28 DIAGNOSIS — C946 Myelodysplastic disease, not classified: Secondary | ICD-10-CM | POA: Diagnosis not present

## 2021-07-28 DIAGNOSIS — D72829 Elevated white blood cell count, unspecified: Secondary | ICD-10-CM | POA: Diagnosis not present

## 2021-07-28 DIAGNOSIS — E860 Dehydration: Secondary | ICD-10-CM | POA: Diagnosis not present

## 2021-07-28 DIAGNOSIS — R112 Nausea with vomiting, unspecified: Secondary | ICD-10-CM | POA: Diagnosis not present

## 2021-07-28 DIAGNOSIS — I483 Typical atrial flutter: Secondary | ICD-10-CM | POA: Diagnosis not present

## 2021-07-28 DIAGNOSIS — M5440 Lumbago with sciatica, unspecified side: Secondary | ICD-10-CM | POA: Diagnosis not present

## 2021-07-28 DIAGNOSIS — N3000 Acute cystitis without hematuria: Secondary | ICD-10-CM | POA: Diagnosis not present

## 2021-07-28 DIAGNOSIS — K449 Diaphragmatic hernia without obstruction or gangrene: Secondary | ICD-10-CM | POA: Diagnosis not present

## 2021-07-28 DIAGNOSIS — N1330 Unspecified hydronephrosis: Secondary | ICD-10-CM | POA: Diagnosis not present

## 2021-07-28 DIAGNOSIS — N12 Tubulo-interstitial nephritis, not specified as acute or chronic: Secondary | ICD-10-CM | POA: Diagnosis not present

## 2021-07-28 DIAGNOSIS — R531 Weakness: Secondary | ICD-10-CM | POA: Diagnosis not present

## 2021-07-28 DIAGNOSIS — R1032 Left lower quadrant pain: Secondary | ICD-10-CM | POA: Diagnosis not present

## 2021-07-28 DIAGNOSIS — K440 Diaphragmatic hernia with obstruction, without gangrene: Secondary | ICD-10-CM | POA: Diagnosis not present

## 2021-07-28 DIAGNOSIS — R059 Cough, unspecified: Secondary | ICD-10-CM | POA: Diagnosis not present

## 2021-07-28 DIAGNOSIS — E8729 Other acidosis: Secondary | ICD-10-CM | POA: Diagnosis not present

## 2021-07-28 DIAGNOSIS — N136 Pyonephrosis: Secondary | ICD-10-CM | POA: Diagnosis not present

## 2021-07-28 DIAGNOSIS — I9589 Other hypotension: Secondary | ICD-10-CM | POA: Diagnosis not present

## 2021-07-28 DIAGNOSIS — E872 Acidosis, unspecified: Secondary | ICD-10-CM | POA: Diagnosis not present

## 2021-07-28 DIAGNOSIS — Z20822 Contact with and (suspected) exposure to covid-19: Secondary | ICD-10-CM | POA: Diagnosis not present

## 2021-07-28 DIAGNOSIS — A0811 Acute gastroenteropathy due to Norwalk agent: Secondary | ICD-10-CM | POA: Diagnosis not present

## 2021-07-28 LAB — TSH: TSH: 4.98 u[IU]/mL — ABNORMAL HIGH (ref 0.450–4.500)

## 2021-07-28 LAB — T4, FREE: Free T4: 0.92 ng/dL (ref 0.82–1.77)

## 2021-07-30 ENCOUNTER — Other Ambulatory Visit: Payer: Self-pay | Admitting: Family Medicine

## 2021-07-30 DIAGNOSIS — M170 Bilateral primary osteoarthritis of knee: Secondary | ICD-10-CM

## 2021-07-30 MED ORDER — OXYCODONE-ACETAMINOPHEN 10-325 MG PO TABS: 1.0000 | ORAL_TABLET | Freq: Four times a day (QID) | ORAL | 0 refills | Status: DC | PRN

## 2021-07-30 NOTE — Telephone Encounter (Signed)
Copied from [place] [id number]. Topic: General - Other ?>> Jul 30, 2021 10:11 AM Tessa Lerner A wrote: ?Reason for CRM: Medication Refill - Medication: oxyCODONE-acetaminophen (PERCOCET) 10-325 MG tablet [[id number]] ? ?Has the patient contacted their pharmacy? No. ?(Agent: If no, request that the patient contact the pharmacy for the refill. If patient does not wish to contact the pharmacy document the reason why and proceed with request.) ?(Agent: If yes, when and what did the pharmacy advise?) ? ?Preferred Pharmacy (with phone number or street name): [id number] - [place], [place] [address] ?[place] [address] ?Phone: [phone] Fax: [phone] ?Hours: Not open 24 hours ? ? ?Has the patient been seen for an appointment in the last year OR does the patient have an upcoming appointment? Yes.   ? ?Agent: Please be advised that RX refills may take up to 3 business days. We ask that you follow-up with your pharmacy. ?

## 2021-07-30 NOTE — Telephone Encounter (Signed)
Requested medication (s) are due for refill today: yes ? ?Requested medication (s) are on the active medication list: yes ? ?Last refill:  07/03/21 ? ?Future visit scheduled: no ? ?Notes to clinic:  med not delegated to NT to RF ? ? ?Requested Prescriptions  ?Pending Prescriptions Disp Refills  ? oxyCODONE-acetaminophen (PERCOCET) 10-325 MG tablet 120 tablet 0  ?  Sig: Take 1 tablet by mouth every 6 (six) hours as needed for pain.  ?  ? Not Delegated - Analgesics:  Opioid Agonist Combinations Failed - 07/30/2021 10:26 AM  ?  ?  Failed - This refill cannot be delegated  ?  ?  Failed - Urine Drug Screen completed in last 360 days  ?  ?  Passed - Valid encounter within last 3 months  ?  Recent Outpatient Visits   ? ?      ? 3 days ago Hypothyroidism, unspecified type  ? [facility] Birdie Sons, MD  ? 3 weeks ago Bacterial sinusitis  ? [facility] Tally Joe T, FNP  ? 3 months ago Hypothyroidism, unspecified type  ? [place] Birdie Sons, MD  ? 5 months ago Viral upper respiratory tract infection  ? [facility] Thedore Mins, Ria Comment, PA-C  ? 6 months ago Arthritis of right knee  ? [facility] Caryn Section, [name], MD  ? ?  ?  ? ? ?  ?  ?  ? ? ? ? ?

## 2021-08-04 DIAGNOSIS — Z885 Allergy status to narcotic agent status: Secondary | ICD-10-CM | POA: Diagnosis not present

## 2021-08-04 DIAGNOSIS — L539 Erythematous condition, unspecified: Secondary | ICD-10-CM | POA: Diagnosis not present

## 2021-08-04 DIAGNOSIS — M79631 Pain in right forearm: Secondary | ICD-10-CM | POA: Diagnosis not present

## 2021-08-04 DIAGNOSIS — N1832 Chronic kidney disease, stage 3b: Secondary | ICD-10-CM | POA: Diagnosis not present

## 2021-08-04 DIAGNOSIS — I82611 Acute embolism and thrombosis of superficial veins of right upper extremity: Secondary | ICD-10-CM | POA: Diagnosis not present

## 2021-08-04 DIAGNOSIS — Z79890 Hormone replacement therapy (postmenopausal): Secondary | ICD-10-CM | POA: Diagnosis not present

## 2021-08-04 DIAGNOSIS — Z7901 Long term (current) use of anticoagulants: Secondary | ICD-10-CM | POA: Diagnosis not present

## 2021-08-04 DIAGNOSIS — I129 Hypertensive chronic kidney disease with stage 1 through stage 4 chronic kidney disease, or unspecified chronic kidney disease: Secondary | ICD-10-CM | POA: Diagnosis not present

## 2021-08-04 DIAGNOSIS — D45 Polycythemia vera: Secondary | ICD-10-CM | POA: Diagnosis not present

## 2021-08-04 DIAGNOSIS — M7989 Other specified soft tissue disorders: Secondary | ICD-10-CM | POA: Diagnosis not present

## 2021-08-04 DIAGNOSIS — C946 Myelodysplastic disease, not classified: Secondary | ICD-10-CM | POA: Diagnosis not present

## 2021-08-04 DIAGNOSIS — C678 Malignant neoplasm of overlapping sites of bladder: Secondary | ICD-10-CM | POA: Diagnosis not present

## 2021-08-08 DIAGNOSIS — M79604 Pain in right leg: Secondary | ICD-10-CM | POA: Diagnosis not present

## 2021-08-08 DIAGNOSIS — Z5181 Encounter for therapeutic drug level monitoring: Secondary | ICD-10-CM | POA: Diagnosis not present

## 2021-08-08 DIAGNOSIS — Z9079 Acquired absence of other genital organ(s): Secondary | ICD-10-CM | POA: Diagnosis not present

## 2021-08-08 DIAGNOSIS — I82431 Acute embolism and thrombosis of right popliteal vein: Secondary | ICD-10-CM | POA: Diagnosis not present

## 2021-08-08 DIAGNOSIS — N183 Chronic kidney disease, stage 3 unspecified: Secondary | ICD-10-CM | POA: Diagnosis not present

## 2021-08-08 DIAGNOSIS — I82411 Acute embolism and thrombosis of right femoral vein: Secondary | ICD-10-CM | POA: Diagnosis not present

## 2021-08-08 DIAGNOSIS — M7989 Other specified soft tissue disorders: Secondary | ICD-10-CM | POA: Diagnosis not present

## 2021-08-08 DIAGNOSIS — I82511 Chronic embolism and thrombosis of right femoral vein: Secondary | ICD-10-CM | POA: Diagnosis not present

## 2021-08-08 DIAGNOSIS — I129 Hypertensive chronic kidney disease with stage 1 through stage 4 chronic kidney disease, or unspecified chronic kidney disease: Secondary | ICD-10-CM | POA: Diagnosis not present

## 2021-08-08 DIAGNOSIS — Z5329 Procedure and treatment not carried out because of patient's decision for other reasons: Secondary | ICD-10-CM | POA: Diagnosis not present

## 2021-08-08 DIAGNOSIS — Z7901 Long term (current) use of anticoagulants: Secondary | ICD-10-CM | POA: Diagnosis not present

## 2021-08-08 DIAGNOSIS — Z79899 Other long term (current) drug therapy: Secondary | ICD-10-CM | POA: Diagnosis not present

## 2021-08-09 DIAGNOSIS — M79604 Pain in right leg: Secondary | ICD-10-CM | POA: Diagnosis not present

## 2021-08-09 DIAGNOSIS — I82511 Chronic embolism and thrombosis of right femoral vein: Secondary | ICD-10-CM | POA: Diagnosis not present

## 2021-08-10 DIAGNOSIS — M7989 Other specified soft tissue disorders: Secondary | ICD-10-CM | POA: Diagnosis not present

## 2021-08-10 DIAGNOSIS — M79661 Pain in right lower leg: Secondary | ICD-10-CM | POA: Diagnosis not present

## 2021-08-10 DIAGNOSIS — M79604 Pain in right leg: Secondary | ICD-10-CM | POA: Diagnosis not present

## 2021-08-10 DIAGNOSIS — I82511 Chronic embolism and thrombosis of right femoral vein: Secondary | ICD-10-CM | POA: Diagnosis not present

## 2021-08-10 DIAGNOSIS — Z20822 Contact with and (suspected) exposure to covid-19: Secondary | ICD-10-CM | POA: Diagnosis not present

## 2021-08-10 DIAGNOSIS — N183 Chronic kidney disease, stage 3 unspecified: Secondary | ICD-10-CM | POA: Diagnosis not present

## 2021-08-17 ENCOUNTER — Ambulatory Visit (INDEPENDENT_AMBULATORY_CARE_PROVIDER_SITE_OTHER): Payer: Medicare PPO | Admitting: Family Medicine

## 2021-08-17 ENCOUNTER — Encounter: Payer: Self-pay | Admitting: Family Medicine

## 2021-08-17 VITALS — BP 132/77 | HR 81 | Temp 97.7°F | Resp 20 | Wt 163.0 lb

## 2021-08-17 DIAGNOSIS — I4892 Unspecified atrial flutter: Secondary | ICD-10-CM | POA: Diagnosis not present

## 2021-08-17 DIAGNOSIS — L0591 Pilonidal cyst without abscess: Secondary | ICD-10-CM | POA: Diagnosis not present

## 2021-08-17 DIAGNOSIS — D45 Polycythemia vera: Secondary | ICD-10-CM

## 2021-08-17 DIAGNOSIS — B9689 Other specified bacterial agents as the cause of diseases classified elsewhere: Secondary | ICD-10-CM

## 2021-08-17 DIAGNOSIS — I82401 Acute embolism and thrombosis of unspecified deep veins of right lower extremity: Secondary | ICD-10-CM | POA: Diagnosis not present

## 2021-08-17 MED ORDER — AMOXICILLIN-POT CLAVULANATE 875-125 MG PO TABS: 1.0000 | ORAL_TABLET | Freq: Two times a day (BID) | ORAL | 0 refills | Status: AC

## 2021-08-17 NOTE — Progress Notes (Signed)
?  ? ?I,Roshena L Chambers,acting as a scribe for Lelon Huh, MD.,have documented all relevant documentation on the behalf of Lelon Huh, MD,as directed by  Lelon Huh, MD while in the presence of Lelon Huh, MD.  ? ?Established patient visit ? ? ?Patient: Jerry Mcpherson   DOB: [birth year]   81 y.o. Male  MRN: [id number] ?Visit Date: 08/17/2021 ? ?Today's healthcare provider: Lelon Huh, MD  ? ?Chief Complaint  ?Patient presents with  ? Follow-up  ? DVT  ? ?Subjective  ?  ?HPI  ?Follow up ER visit ? ?Patient was seen in ER for Leg Pain on 08/10/2021. ?He was treated for DVT. ?Treatment for this included increasing Eliquis from '5mg'$  QD to '5mg'$  BID. He was also in the ED on 4/25 with superficial venous thrombosis of right upper extremity. Marland Kitchen ?He reports good compliance with treatment. ?He reports this condition is Unchanged. ?Patient reports that his Eliquis is expensive. Patient reports swelling and slight redness in the right lower leg. He denies any shortness of breat or chest pain. Is not having any abnormal bleeding.  ? ?He is followed at [facility] hematology for PCV and has follow up scheduled on 08/25/21.  ?-----------------------------------------------------------------------------------------  ? ?Medications: ?Outpatient Medications Prior to Visit  ?Medication Sig  ? acetaminophen (TYLENOL) 500 MG tablet Take 2 tablets (1,000 mg total) by mouth every 6 (six) hours as needed for mild pain or moderate pain.  ? amLODipine (NORVASC) 2.5 MG tablet TAKE ONE (1) TABLET BY MOUTH ONCE DAILY  ? Ascorbic Acid (VITAMIN C) 1000 MG tablet Take 2 tablets by mouth daily.  ? aspirin 81 MG tablet Take 2 tablets daily  ? budesonide-formoterol (SYMBICORT) 160-4.5 MCG/ACT inhaler Inhale 2 puffs into the lungs 2 (two) times daily.  ? cyproheptadine (PERIACTIN) 4 MG tablet TAKE 1/2-1 TABLET BY MOUTH THREE TIMES DAILY AS NEEDED TO HELP APPETITE  ? diclofenac Sodium (VOLTAREN) 1 % GEL Apply 4 g topically 4 (four) times  daily as needed.  ? docusate sodium (COLACE) 100 MG capsule Take 1 capsule by mouth daily as needed.  ? ELIQUIS 5 MG TABS tablet Take 1 tablet (5 mg total) by mouth 2 (two) times daily.  ? fluticasone (FLONASE) 50 MCG/ACT nasal spray Place 2 sprays into both nostrils daily.  ? hydrochlorothiazide (HYDRODIURIL) 25 MG tablet Take 1 tablet (25 mg total) by mouth daily.  ? hydroxyurea (HYDREA) 500 MG capsule Take 500 mg by mouth as directed. 2 tablets 3 days a week, and 1 tablet all other days  ? levothyroxine (SYNTHROID) 75 MCG tablet TAKE (1) TABLET BY MOUTH EVERY DAY  ? metoprolol succinate (TOPROL-XL) 50 MG 24 hr tablet Take 50 mg by mouth daily.  ? Multiple Vitamins-Minerals (MULTIVITAMIN ADULT PO) Take 1 tablet by mouth daily.  ? naproxen (NAPROSYN) 500 MG tablet TAKE 1 TABLET(500 MG) BY MOUTH TWICE DAILY WITH A MEAL  ? oxyCODONE-acetaminophen (PERCOCET) 10-325 MG tablet Take 1 tablet by mouth every 6 (six) hours as needed for pain.  ? potassium chloride (KLOR-CON) 10 MEQ tablet Take by mouth.  ? promethazine (PHENERGAN) 12.5 MG tablet TAKE 1 TO 2 TABLETS(12.5 TO 25 MG) BY MOUTH EVERY 8 HOURS AS NEEDED FOR NAUSEA OR VOMITING  ? sildenafil (VIAGRA) 50 MG tablet Take 0.5-1 tablets by mouth daily as needed.  ? valACYclovir (VALTREX) 1000 MG tablet TAKE 1 TABLET(1000 MG) BY MOUTH TWICE DAILY AS NEEDED  ? ?No facility-administered medications prior to visit.  ? ? ?Review of Systems  ?Constitutional:  Negative  for appetite change, chills and fever.  ?Respiratory:  Negative for chest tightness, shortness of breath and wheezing.   ?Cardiovascular:  Positive for leg swelling (right lower leg). Negative for chest pain and palpitations.  ?Gastrointestinal:  Negative for abdominal pain, nausea and vomiting.  ?Skin:  Positive for color change.  ? ? ?  Objective  ?  ?BP 132/77 (BP Location: Left Arm, Patient Position: Sitting, Cuff Size: Normal)   Pulse 81   Temp 97.7 ?F (36.5 ?C) (Oral)   Resp 20   Wt 163 lb (73.9 kg)    SpO2 100% Comment: room air  BMI 24.78 kg/m?  ? ? ?Physical Exam  ? ?General appearance: Well developed, well nourished male, cooperative and in no acute distress ?Head: Normocephalic, without obvious abnormality, atraumatic ?Respiratory: Respirations even and unlabored, normal respiratory rate ?Extremities: All extremities are intact. Right lower leg 2+ edema, 1+ on left ?Skin: Skin color, texture, turgor normal. No rashes seen  ?Psych: Appropriate mood and affect. ?Neurologic: Mental status: Alert, oriented to person, place, and time, thought content appropriate.  ? ? Assessment & Plan  ?  ? ?1. Recurrent acute deep vein thrombosis (DVT) of right lower extremity ([place]) ?Has increase Eliquis back up to '5mg'$  BID since recent [facility] ER visit. He has follow up [facility] hematology for PCV next week and advised to discuss DVT management there.  ? ?2. Pilonidal cyst ?Has responded well to Augmentin in the past. Advised we could refer for excision if this becomes chronic problem for now will start back on  amoxicillin-clavulanate (AUGMENTIN) 875-125 MG tablet; Take 1 tablet by mouth 2 (two) times daily for 10 days.  Dispense: 20 tablet; Refill: 0 ? ?3. Atrial flutter, unspecified type ([place]) ?On chronic anticoagulation, rate well controlled. ? ?4. Polycythemia vera ([place]) ?Is to follow up [facility] hematology as above next week.   ? ?Future Appointments  ?Date Time Provider [place]  ?02/05/2022 11:00 AM Caryn Section [name], MD [facility]  ?  ?   ? ?The entirety of the information documented in the History of Present Illness, Review of Systems and Physical Exam were personally obtained by me. Portions of this information were initially documented by the CMA and reviewed by me for thoroughness and accuracy.   ? ? ?Lelon Huh, MD  ?[facility] ?[phone] (phone) ?[phone] (fax) ? ?[facility]  ?

## 2021-08-17 NOTE — Patient Instructions (Signed)
Please review the attached list of medications and notify my office if there are any errors.  ? ?Be sure to discuss your blood clots at you appointment with the hematologist on 08-25-2021. Hematologists also specialize in blood clotting disorders.  ?

## 2021-08-18 DIAGNOSIS — L0591 Pilonidal cyst without abscess: Secondary | ICD-10-CM | POA: Insufficient documentation

## 2021-08-24 DIAGNOSIS — Z1589 Genetic susceptibility to other disease: Secondary | ICD-10-CM | POA: Diagnosis not present

## 2021-08-24 DIAGNOSIS — D45 Polycythemia vera: Secondary | ICD-10-CM | POA: Diagnosis not present

## 2021-08-25 DIAGNOSIS — C678 Malignant neoplasm of overlapping sites of bladder: Secondary | ICD-10-CM | POA: Diagnosis not present

## 2021-08-26 ENCOUNTER — Other Ambulatory Visit: Payer: Self-pay | Admitting: Family Medicine

## 2021-08-26 DIAGNOSIS — M170 Bilateral primary osteoarthritis of knee: Secondary | ICD-10-CM

## 2021-08-26 NOTE — Telephone Encounter (Signed)
Medication Refill - Medication: oxyCODONE-acetaminophen (PERCOCET) 10-325 MG tablet ? ?Has the patient contacted their pharmacy? No. Pt previously told to contact provider ? ?Preferred Pharmacy (with phone number or street name):  ?[place], [place] [address] AT [place] Phone:  [phone]  ?Fax:  [phone]  ?  ? ?Has the patient been seen for an appointment in the last year OR does the patient have an upcoming appointment? Yes.   ? ?Agent: Please be advised that RX refills may take up to 3 business days. We ask that you follow-up with your pharmacy. ? ?

## 2021-08-26 NOTE — Telephone Encounter (Signed)
Requested medications are due for refill today.  yes ? ?Requested medications are on the active medications list.  yes ? ?Last refill. 07/30/2021 #120 0 refills ? ?Future visit scheduled.   yes ? ?Notes to clinic.  Medication refill is not delegated. ? ? ? ?Requested Prescriptions  ?Pending Prescriptions Disp Refills  ? oxyCODONE-acetaminophen (PERCOCET) 10-325 MG tablet 120 tablet 0  ?  Sig: Take 1 tablet by mouth every 6 (six) hours as needed for pain.  ?  ? Not Delegated - Analgesics:  Opioid Agonist Combinations Failed - 08/26/2021  2:00 PM  ?  ?  Failed - This refill cannot be delegated  ?  ?  Failed - Urine Drug Screen completed in last 360 days  ?  ?  Passed - Valid encounter within last 3 months  ?  Recent Outpatient Visits   ? ?      ? 1 week ago Recurrent acute deep vein thrombosis (DVT) of right lower extremity ([place])  ? [facility] Birdie Sons, MD  ? 1 month ago Hypothyroidism, unspecified type  ? [facility] Birdie Sons, MD  ? 1 month ago Bacterial sinusitis  ? [facility] Tally Joe T, FNP  ? 4 months ago Hypothyroidism, unspecified type  ? [facility] Birdie Sons, MD  ? 6 months ago Viral upper respiratory tract infection  ? [facility] Thedore Mins, [place], PA-C  ? ?  ?  ?Future Appointments   ? ?        ? In 5 months Fisher, [name], MD [facility]  ? ?  ? ? ?  ?  ?  ?  ?

## 2021-08-27 MED ORDER — OXYCODONE-ACETAMINOPHEN 10-325 MG PO TABS: 1.0000 | ORAL_TABLET | Freq: Four times a day (QID) | ORAL | 0 refills | Status: DC | PRN

## 2021-08-31 DIAGNOSIS — L89151 Pressure ulcer of sacral region, stage 1: Secondary | ICD-10-CM | POA: Diagnosis not present

## 2021-09-03 ENCOUNTER — Telehealth: Payer: Self-pay | Admitting: *Deleted

## 2021-09-03 NOTE — Telephone Encounter (Signed)
Junie Panning, [facility] with [facility] calling with concerns regarding pt. Pt states he was directed to take Eliquis once a day and he has been taking only once a day, "Has always been on twice a day."  States pt met with pharmacist and told pharmacist he was taking twice a day. Junie Panning concerned pt not as "Sharp" since chemo for bladder cancer. Questioning if Dr. Caryn Section would consider a memory eval. Also concerned with cost of meds and requesting help.   May reach [place] at [phone]

## 2021-09-04 DIAGNOSIS — L89151 Pressure ulcer of sacral region, stage 1: Secondary | ICD-10-CM | POA: Diagnosis not present

## 2021-09-10 DIAGNOSIS — I10 Essential (primary) hypertension: Secondary | ICD-10-CM | POA: Diagnosis not present

## 2021-09-10 DIAGNOSIS — R42 Dizziness and giddiness: Secondary | ICD-10-CM | POA: Diagnosis not present

## 2021-09-10 DIAGNOSIS — D649 Anemia, unspecified: Secondary | ICD-10-CM | POA: Diagnosis not present

## 2021-09-10 DIAGNOSIS — Z7901 Long term (current) use of anticoagulants: Secondary | ICD-10-CM | POA: Diagnosis not present

## 2021-09-10 DIAGNOSIS — I82401 Acute embolism and thrombosis of unspecified deep veins of right lower extremity: Secondary | ICD-10-CM | POA: Diagnosis not present

## 2021-09-10 DIAGNOSIS — Z79899 Other long term (current) drug therapy: Secondary | ICD-10-CM | POA: Diagnosis not present

## 2021-09-10 DIAGNOSIS — Z87891 Personal history of nicotine dependence: Secondary | ICD-10-CM | POA: Diagnosis not present

## 2021-09-10 DIAGNOSIS — D45 Polycythemia vera: Secondary | ICD-10-CM | POA: Diagnosis not present

## 2021-09-14 ENCOUNTER — Telehealth: Payer: Self-pay

## 2021-09-14 DIAGNOSIS — L89151 Pressure ulcer of sacral region, stage 1: Secondary | ICD-10-CM | POA: Diagnosis not present

## 2021-09-14 NOTE — Telephone Encounter (Signed)
Please advise if patient needs to schedule appointment here for evaluation of wound.

## 2021-09-14 NOTE — Telephone Encounter (Signed)
Copied from [place] [id number]. Topic: General - Call Back - No Documentation >> Sep 14, 2021  1:50 PM Erick Blinks wrote: Gwinda Passe NP from [facility] is requesting a call back from the clinic regarding helping this patient with Wound care. He has a pressure sore on his flank that he is currently having changed weekly at urgent care. Gwinda Passe wants a call back to see what can be done with his PCP.  Best contact: [phone]

## 2021-09-17 NOTE — Telephone Encounter (Signed)
Patient advised. Appointment scheduled.  

## 2021-09-17 NOTE — Telephone Encounter (Signed)
Needs office here to evaluated and can refer to local wound clinic if needed.

## 2021-09-17 NOTE — Progress Notes (Addendum)
I,Roshena L Chambers,acting as a scribe for Lelon Huh, MD.,have documented all relevant documentation on the behalf of Lelon Huh, MD,as directed by  Lelon Huh, MD while in the presence of Lelon Huh, MD.    Established patient visit   Patient: Jerry Mcpherson   DOB: Jan 14, 1941   81 y.o. Male  MRN: [id number] Visit Date: 09/18/2021  Today's healthcare provider: Lelon Huh, MD   Chief Complaint  Patient presents with   Wound Check   Subjective    Wound Check Patient has a wound in the sacral region. This is recurrence of lesion initially treated as pilonidal cyst in September 2022 with Augmentin. He states that lesion completely resolved, but had recurred when he was seen here on 08/17/21 and was prescribed another round of Augmentin. However, it did not clear and he subsequently present to urgent care in [facility] and treated for sacral pressure ulcer with duoderm dressing changes and sitting pillow. He had follow up duoderm dressing changes at [facility] urgent care on 5/26 and 6/5 by which time the wound had scabbed over and mostly healed. He is here today for follow up.   Medications: Outpatient Medications Prior to Visit  Medication Sig   acetaminophen (TYLENOL) 500 MG tablet Take 2 tablets (1,000 mg total) by mouth every 6 (six) hours as needed for mild pain or moderate pain.   amLODipine (NORVASC) 2.5 MG tablet TAKE ONE (1) TABLET BY MOUTH ONCE DAILY   Ascorbic Acid (VITAMIN C) 1000 MG tablet Take 2 tablets by mouth daily.   aspirin 81 MG tablet Take 2 tablets daily   budesonide-formoterol (SYMBICORT) 160-4.5 MCG/ACT inhaler Inhale 2 puffs into the lungs 2 (two) times daily.   cyproheptadine (PERIACTIN) 4 MG tablet TAKE 1/2-1 TABLET BY MOUTH THREE TIMES DAILY AS NEEDED TO HELP APPETITE   diclofenac Sodium (VOLTAREN) 1 % GEL Apply 4 g topically 4 (four) times daily as needed.   docusate sodium (COLACE) 100 MG capsule Take 1 capsule by mouth daily as  needed.   ELIQUIS 5 MG TABS tablet Take 1 tablet (5 mg total) by mouth 2 (two) times daily.   fluticasone (FLONASE) 50 MCG/ACT nasal spray Place 2 sprays into both nostrils daily.   hydrochlorothiazide (HYDRODIURIL) 25 MG tablet Take 1 tablet (25 mg total) by mouth daily.   hydroxyurea (HYDREA) 500 MG capsule Take 500 mg by mouth as directed. 2 tablets 3 days a week, and 1 tablet all other days   levothyroxine (SYNTHROID) 75 MCG tablet TAKE (1) TABLET BY MOUTH EVERY DAY   metoprolol succinate (TOPROL-XL) 50 MG 24 hr tablet Take 50 mg by mouth daily.   Multiple Vitamins-Minerals (MULTIVITAMIN ADULT PO) Take 1 tablet by mouth daily.   naproxen (NAPROSYN) 500 MG tablet TAKE 1 TABLET(500 MG) BY MOUTH TWICE DAILY WITH A MEAL   oxyCODONE-acetaminophen (PERCOCET) 10-325 MG tablet Take 1 tablet by mouth every 6 (six) hours as needed for pain.   potassium chloride (KLOR-CON) 10 MEQ tablet Take by mouth.   promethazine (PHENERGAN) 12.5 MG tablet TAKE 1 TO 2 TABLETS(12.5 TO 25 MG) BY MOUTH EVERY 8 HOURS AS NEEDED FOR NAUSEA OR VOMITING   sildenafil (VIAGRA) 50 MG tablet Take 0.5-1 tablets by mouth daily as needed.   valACYclovir (VALTREX) 1000 MG tablet TAKE 1 TABLET(1000 MG) BY MOUTH TWICE DAILY AS NEEDED   No facility-administered medications prior to visit.    Review of Systems  Constitutional:  Negative for appetite change, chills and fever.  Respiratory:  Negative for chest tightness, shortness of breath and wheezing.   Cardiovascular:  Negative for chest pain and palpitations.  Gastrointestinal:  Negative for abdominal pain, nausea and vomiting.  Skin:  Positive for wound.       Objective    BP 131/85 (BP Location: Right Arm, Patient Position: Sitting, Cuff Size: Normal)   Pulse 91   Temp 97.7 F (36.5 C) (Oral)   Resp 16   Wt 165 lb (74.8 kg)   SpO2 96% Comment: room air  BMI 25.09 kg/m    Physical Exam   Removed duoderm dressing over sacrum. Underlying skin is clean and dry,  minimally erythematous. Not tender.   Assessment & Plan     1. Pilonidal cyst Apparently developed pressure sore in the area which has now healed. Is to continue using sacral pillow. Will try mupirocin cream (BACTROBAN) 2 %; Apply 1 application  topically 2 (two) times daily.  Dispense: 30 g; Refill: 3  which will hopefully reduce risk or recurrence.   Is to return in a month to recheck.      The entirety of the information documented in the History of Present Illness, Review of Systems and Physical Exam were personally obtained by me. Portions of this information were initially documented by the CMA and reviewed by me for thoroughness and accuracy.     Lelon Huh, MD  [facility] [phone] (phone) [phone] (fax)  [place]

## 2021-09-18 ENCOUNTER — Encounter: Payer: Self-pay | Admitting: Family Medicine

## 2021-09-18 ENCOUNTER — Ambulatory Visit (INDEPENDENT_AMBULATORY_CARE_PROVIDER_SITE_OTHER): Payer: Medicare PPO | Admitting: Family Medicine

## 2021-09-18 VITALS — BP 131/85 | HR 91 | Temp 97.7°F | Resp 16 | Wt 165.0 lb

## 2021-09-18 DIAGNOSIS — L0591 Pilonidal cyst without abscess: Secondary | ICD-10-CM | POA: Diagnosis not present

## 2021-09-18 MED ORDER — MUPIROCIN CALCIUM 2 % EX CREA: 1.0000 "application " | TOPICAL_CREAM | Freq: Two times a day (BID) | CUTANEOUS | 3 refills | Status: DC

## 2021-09-23 ENCOUNTER — Telehealth: Payer: Self-pay | Admitting: Family Medicine

## 2021-09-23 DIAGNOSIS — M170 Bilateral primary osteoarthritis of knee: Secondary | ICD-10-CM

## 2021-09-23 MED ORDER — OXYCODONE-ACETAMINOPHEN 10-325 MG PO TABS: 1.0000 | ORAL_TABLET | Freq: Four times a day (QID) | ORAL | 0 refills | Status: DC | PRN

## 2021-09-23 NOTE — Telephone Encounter (Signed)
Requested Prescriptions  Pending Prescriptions Disp Refills  . oxyCODONE-acetaminophen (PERCOCET) 10-325 MG tablet 120 tablet     Sig: Take 1 tablet by mouth every 6 (six) hours as needed for pain.     Not Delegated - Analgesics:  Opioid Agonist Combinations Failed - 09/23/2021 12:51 PM      Failed - This refill cannot be delegated      Failed - Urine Drug Screen completed in last 360 days      Passed - Valid encounter within last 3 months    Recent Outpatient Visits          5 days ago Pilonidal cyst   [facility] Birdie Sons, MD   1 month ago Recurrent acute deep vein thrombosis (DVT) of right lower extremity [facility])   [facility] Birdie Sons, MD   1 month ago Hypothyroidism, unspecified type   [facility] Birdie Sons, MD   2 months ago Bacterial sinusitis   [facility] Tally Joe T, FNP   5 months ago Hypothyroidism, unspecified type   [place], [place]      Future Appointments            In 3 weeks Fisher, [name], MD [facility]   In 4 months Fisher, [name], MD [facility]           . mupirocin cream (BACTROBAN) 2 % 30 g     Sig: Apply 1 application  topically 2 (two) times daily.     Off-Protocol Failed - 09/23/2021 12:51 PM      Failed - Medication not assigned to a protocol, review manually.      Passed - Valid encounter within last 12 months    Recent Outpatient Visits          5 days ago Pilonidal cyst   [facility] Birdie Sons, MD   1 month ago Recurrent acute deep vein thrombosis (DVT) of right lower extremity [facility])   [facility] Birdie Sons, MD   1 month ago Hypothyroidism, unspecified type   [facility] Birdie Sons, MD   2 months ago Bacterial sinusitis   [facility] Tally Joe T, FNP   5 months ago Hypothyroidism, unspecified type    [facility] Birdie Sons, MD      Future Appointments            In 3 weeks Fisher, [name], MD [facility], [place]   In 4 months Fisher, [name], MD [facility], [place]

## 2021-09-23 NOTE — Telephone Encounter (Signed)
Pt called that the pharmacy only has 60 pills that they can give him, he says they will need a new prescription. The patient is asking for the office to contact the pharmacy

## 2021-09-23 NOTE — Telephone Encounter (Signed)
Requested medication (s) are due for refill today: no  Requested medication (s) are on the active medication list: yes  Last refill:  08/27/21  Future visit scheduled: yes  Notes to clinic:  med not delegated to NT to RF   Requested Prescriptions  Pending Prescriptions Disp Refills   oxyCODONE-acetaminophen (PERCOCET) 10-325 MG tablet 120 tablet     Sig: Take 1 tablet by mouth every 6 (six) hours as needed for pain.     Not Delegated - Analgesics:  Opioid Agonist Combinations Failed - 09/23/2021 12:51 PM      Failed - This refill cannot be delegated      Failed - Urine Drug Screen completed in last 360 days      Passed - Valid encounter within last 3 months    Recent Outpatient Visits           5 days ago Pilonidal cyst   [facility] Birdie Sons, MD   1 month ago Recurrent acute deep vein thrombosis (DVT) of right lower extremity [facility])   [facility] Birdie Sons, MD   1 month ago Hypothyroidism, unspecified type   [facility] Birdie Sons, MD   2 months ago Bacterial sinusitis   [facility] Tally Joe T, FNP   5 months ago Hypothyroidism, unspecified type   [facility] Birdie Sons, MD       Future Appointments             In 3 weeks Fisher, [name], MD [facility]   In 4 months Fisher, [name], MD [facility], [facility]            Refused Prescriptions Disp Refills   mupirocin cream (BACTROBAN) 2 % 30 g     Sig: Apply 1 application  topically 2 (two) times daily.     Off-Protocol Failed - 09/23/2021 12:51 PM      Failed - Medication not assigned to a protocol, review manually.      Passed - Valid encounter within last 12 months    Recent Outpatient Visits           5 days ago Pilonidal cyst   [facility] Birdie Sons, MD   1 month ago Recurrent acute deep vein thrombosis (DVT) of right lower extremity [facility])    [facility] Birdie Sons, MD   1 month ago Hypothyroidism, unspecified type   [facility] Birdie Sons, MD   2 months ago Bacterial sinusitis   [facility] Tally Joe T, FNP   5 months ago Hypothyroidism, unspecified type   [facility] Birdie Sons, MD       Future Appointments             In 3 weeks Fisher, [name], MD [facility], [place]   In 4 months Fisher, [name], MD [facility], [place]

## 2021-09-23 NOTE — Telephone Encounter (Signed)
Medication Refill - Medication: mupirocin ointment (BACTROBAN) 2 % (Pt needs ointment for insurance, insurance will not cover cream)  oxyCODONE-acetaminophen (PERCOCET) 10-325 MG tablet   Has the patient contacted their pharmacy? Yes.   (Agent: If no, request that the patient contact the pharmacy for the refill. If patient does not wish to contact the pharmacy document the reason why and proceed with request.) (Agent: If yes, when and what did the pharmacy advise?)  Preferred Pharmacy (with phone number or street name):  [facility] [place], [place] [address]  [place] [address]  Phone: [phone] Fax: [phone]   Has the patient been seen for an appointment in the last year OR does the patient have an upcoming appointment? Yes.    Agent: Please be advised that RX refills may take up to 3 business days. We ask that you follow-up with your pharmacy.

## 2021-09-24 NOTE — Telephone Encounter (Signed)
I contacted pharmacy and Rx on back order.  #60 was dispensed to pt.  They need new Rx for remaining #60.

## 2021-09-24 NOTE — Telephone Encounter (Signed)
Please clarify... does that mean he got 60 tablets from the prescription that was sent yesterday?   In which case we will need to send in another for 60 before the current bottle of 60 runs out?

## 2021-09-25 NOTE — Telephone Encounter (Signed)
Yes Sir, Pharmacy dispensed #60 to pt.  That was all they had.

## 2021-09-28 ENCOUNTER — Other Ambulatory Visit: Payer: Self-pay | Admitting: Family Medicine

## 2021-09-28 DIAGNOSIS — G589 Mononeuropathy, unspecified: Secondary | ICD-10-CM

## 2021-09-28 DIAGNOSIS — M5410 Radiculopathy, site unspecified: Secondary | ICD-10-CM

## 2021-09-28 NOTE — Telephone Encounter (Signed)
Pt stated he was only able to get 60 tablets out of the 120 that the Rx was written for.  Medication Refill - Medication: oxyCODONE-acetaminophen (PERCOCET) 10-325 MG tablet  Has the patient contacted their pharmacy? Yes.   Pt told to contact provider for Rx of remaining 60 tablets.  Preferred Pharmacy (with phone number or street name):  [place], [place] Phone:  [phone]  Fax:  [phone]     Has the patient been seen for an appointment in the last year OR does the patient have an upcoming appointment? Yes.    Agent: Please be advised that RX refills may take up to 3 business days. We ask that you follow-up with your pharmacy.

## 2021-09-28 NOTE — Telephone Encounter (Signed)
Patient got partial Rx at pharmacy and is needing another Rx for remainder of medication- controled Rx.

## 2021-09-28 NOTE — Telephone Encounter (Signed)
Pt called back to request that this [facility] [place], he does not want it sent it to [place] or anywhere else. Preferred pharmacy list updated

## 2021-09-29 MED ORDER — OXYCODONE-ACETAMINOPHEN 10-325 MG PO TABS: 1.0000 | ORAL_TABLET | Freq: Four times a day (QID) | ORAL | 0 refills | Status: DC | PRN

## 2021-09-29 NOTE — Addendum Note (Signed)
Addended by: Birdie Sons on: 09/29/2021 04:59 PM   Modules accepted: Orders

## 2021-09-29 NOTE — Telephone Encounter (Signed)
Pt called about med, going out of town.

## 2021-09-30 NOTE — Telephone Encounter (Signed)
Pt is going out of town and went to [facility] that [facility] for oxyCODONE-acetaminophen (PERCOCET) 10-325 MG tablet/ [facility] unless Dr. Caryn Section calls them to confirm it is ok / pt needs to fill RX before leaving town / please advise asap

## 2021-09-30 NOTE — Telephone Encounter (Signed)
Pharmacist given verbal okay to fill.

## 2021-09-30 NOTE — Telephone Encounter (Addendum)
That's fine. The pharmacy did not have have enough in stock to fill the entire prescription that was sent on 6-15, so this is for the rest of the prescription and he will run out before he comes back to town otherwise.

## 2021-09-30 NOTE — Telephone Encounter (Signed)
Please review

## 2021-10-05 MED ORDER — OXYCODONE-ACETAMINOPHEN 10-325 MG PO TABS: 1.0000 | ORAL_TABLET | Freq: Four times a day (QID) | ORAL | 0 refills | Status: DC | PRN

## 2021-10-05 NOTE — Telephone Encounter (Addendum)
I called [facility] and confirmed that they had this medication in stock. Sherrie Mustache is out of the office this week. Please review and advise on patient request.

## 2021-10-05 NOTE — Telephone Encounter (Addendum)
Tried calling patient. No answer or voice message system. Patient needs to speak with his pharmacy about this issue. OK for [facility] triage to advise.

## 2021-10-15 ENCOUNTER — Telehealth: Payer: Self-pay | Admitting: Family Medicine

## 2021-10-15 NOTE — Telephone Encounter (Signed)
ELIQUIS 5 MG TABS tablet  Pt called and stated that this medication is very expensive and would like to know if he has any other options or coupons. Patient states that he only has 11 days of pills left.   Please advise.

## 2021-10-15 NOTE — Telephone Encounter (Signed)
Do you think Mr Colin would qualify for patient assistance for Eliquis?

## 2021-10-15 NOTE — Telephone Encounter (Signed)
Potentially, I would be happy to reach out to the patient to check. Can you please place a referral for him?   Thanks, Junius Argyle, PharmD, [place], [place] Pharmacist Practitioner  [facility] [phone]

## 2021-10-19 NOTE — Progress Notes (Signed)
I,Roshena L Chambers,acting as a scribe for Lelon Huh, MD.,have documented all relevant documentation on the behalf of Lelon Huh, MD,as directed by  Lelon Huh, MD while in the presence of Lelon Huh, MD.    Established patient visit   Patient: Jerry Mcpherson   DOB: 12-Aug-1940   81 y.o. Male  MRN: [id number] Visit Date: 10/20/2021  Today's healthcare provider: Lelon Huh, MD   Chief Complaint  Patient presents with   Follow-up   Subjective    Follow up for Pilonidal cyst   The patient was last seen for this 1 months ago. Changes made at last visit include: Is to continue using sacral pillow. Will try mupirocin cream (BACTROBAN) 2 %; Apply 1 application  topically 2 (two) times daily. Marland Kitchen He reports good compliance with treatment. He feels that condition is  resolved .  He is not having side effects.   -----------------------------------------------------------------------------------------   Medications: Outpatient Medications Prior to Visit  Medication Sig   amLODipine (NORVASC) 2.5 MG tablet TAKE ONE (1) TABLET BY MOUTH ONCE DAILY   Ascorbic Acid (VITAMIN C) 1000 MG tablet Take 2 tablets by mouth daily.   aspirin 81 MG tablet Take 2 tablets daily   budesonide-formoterol (SYMBICORT) 160-4.5 MCG/ACT inhaler Inhale 2 puffs into the lungs 2 (two) times daily.   cyproheptadine (PERIACTIN) 4 MG tablet TAKE 1/2-1 TABLET BY MOUTH THREE TIMES DAILY AS NEEDED TO HELP APPETITE   diclofenac Sodium (VOLTAREN) 1 % GEL Apply 4 g topically 4 (four) times daily as needed.   docusate sodium (COLACE) 100 MG capsule Take 1 capsule by mouth daily as needed.   ELIQUIS 5 MG TABS tablet Take 1 tablet (5 mg total) by mouth 2 (two) times daily.   fluticasone (FLONASE) 50 MCG/ACT nasal spray Place 2 sprays into both nostrils daily.   hydrochlorothiazide (HYDRODIURIL) 25 MG tablet Take 1 tablet (25 mg total) by mouth daily.   hydroxyurea (HYDREA) 500 MG capsule Take 500 mg by  mouth as directed. 2 tablets 3 days a week, and 1 tablet all other days   levothyroxine (SYNTHROID) 75 MCG tablet TAKE (1) TABLET BY MOUTH EVERY DAY   metoprolol succinate (TOPROL-XL) 50 MG 24 hr tablet Take 50 mg by mouth daily.   Multiple Vitamins-Minerals (MULTIVITAMIN ADULT PO) Take 1 tablet by mouth daily.   mupirocin cream (BACTROBAN) 2 % Apply 1 application  topically 2 (two) times daily.   naproxen (NAPROSYN) 500 MG tablet TAKE 1 TABLET(500 MG) BY MOUTH TWICE DAILY WITH A MEAL   oxyCODONE-acetaminophen (PERCOCET) 10-325 MG tablet Take 1 tablet by mouth every 6 (six) hours as needed for pain.   potassium chloride (KLOR-CON) 10 MEQ tablet Take by mouth.   promethazine (PHENERGAN) 12.5 MG tablet TAKE 1 TO 2 TABLETS(12.5 TO 25 MG) BY MOUTH EVERY 8 HOURS AS NEEDED FOR NAUSEA OR VOMITING   sildenafil (VIAGRA) 50 MG tablet Take 0.5-1 tablets by mouth daily as needed.   valACYclovir (VALTREX) 1000 MG tablet TAKE 1 TABLET(1000 MG) BY MOUTH TWICE DAILY AS NEEDED   No facility-administered medications prior to visit.    Review of Systems  Constitutional:  Negative for appetite change, chills and fever.  Respiratory:  Negative for chest tightness, shortness of breath and wheezing.   Cardiovascular:  Negative for chest pain and palpitations.  Gastrointestinal:  Negative for abdominal pain, nausea and vomiting.       Objective    BP 136/82   Pulse 61   Temp (!)  97.4 F (36.3 C) (Oral)   Resp 14   Wt 160 lb (72.6 kg)   SpO2 98% Comment: room air  BMI 24.33 kg/m    Today's Vitals   10/20/21 1310 10/20/21 1316  BP: (!) 155/87 (!) 153/85  Pulse: 63 61  Resp: 14   Temp: (!) 97.4 F (36.3 C)   TempSrc: Oral   SpO2: 98%   Weight: 160 lb (72.6 kg)    Body mass index is 24.33 kg/m.   Physical Exam   Healthy pink skin at site of previous open wound. Healing normally.     Assessment & Plan     1. Pilonidal cyst Resolved.      The entirety of the information documented in  the History of Present Illness, Review of Systems and Physical Exam were personally obtained by me. Portions of this information were initially documented by the CMA and reviewed by me for thoroughness and accuracy.     Lelon Huh, MD  [facility] [phone] (phone) [phone] (fax)  [place]

## 2021-10-20 ENCOUNTER — Encounter: Payer: Self-pay | Admitting: Family Medicine

## 2021-10-20 ENCOUNTER — Ambulatory Visit (INDEPENDENT_AMBULATORY_CARE_PROVIDER_SITE_OTHER): Payer: Medicare PPO | Admitting: Family Medicine

## 2021-10-20 VITALS — BP 136/82 | HR 61 | Temp 97.4°F | Resp 14 | Wt 160.0 lb

## 2021-10-20 DIAGNOSIS — L0591 Pilonidal cyst without abscess: Secondary | ICD-10-CM | POA: Diagnosis not present

## 2021-10-22 ENCOUNTER — Other Ambulatory Visit: Payer: Self-pay | Admitting: Family Medicine

## 2021-10-22 ENCOUNTER — Telehealth: Payer: Self-pay

## 2021-10-22 DIAGNOSIS — G589 Mononeuropathy, unspecified: Secondary | ICD-10-CM

## 2021-10-22 DIAGNOSIS — M5410 Radiculopathy, site unspecified: Secondary | ICD-10-CM

## 2021-10-22 MED ORDER — OXYCODONE-ACETAMINOPHEN 10-325 MG PO TABS: 1.0000 | ORAL_TABLET | Freq: Four times a day (QID) | ORAL | 0 refills | Status: DC | PRN

## 2021-10-22 NOTE — Telephone Encounter (Signed)
Medication Refill - Medication: oxyCODONE-acetaminophen (PERCOCET) 10-325 MG tablet  Has the patient contacted their pharmacy? No.  Preferred Pharmacy (with phone number or street name):  [place], [place]. Phone:  [phone]  Fax:  [phone]     Has the patient been seen for an appointment in the last year OR does the patient have an upcoming appointment? Yes.    Agent: Please be advised that RX refills may take up to 3 business days. We ask that you follow-up with your pharmacy.

## 2021-10-22 NOTE — Telephone Encounter (Signed)
Requested medication (s) are due for refill today - provider review  Requested medication (s) are on the active medication list - yes  Future visit scheduled -yes  Last refill: 10/05/21 #60  Notes to clinic: non delegated Rx  Requested Prescriptions  Pending Prescriptions Disp Refills   oxyCODONE-acetaminophen (PERCOCET) 10-325 MG tablet 60 tablet 0    Sig: Take 1 tablet by mouth every 6 (six) hours as needed for pain.     Not Delegated - Analgesics:  Opioid Agonist Combinations Failed - 10/22/2021  9:02 AM      Failed - This refill cannot be delegated      Failed - Urine Drug Screen completed in last 360 days      Passed - Valid encounter within last 3 months    Recent Outpatient Visits           2 days ago Pilonidal cyst   [facility] Birdie Sons, MD   1 month ago Pilonidal cyst   [facility] Birdie Sons, MD   2 months ago Recurrent acute deep vein thrombosis (DVT) of right lower extremity [facility])   [facility] Birdie Sons, MD   2 months ago Hypothyroidism, unspecified type   [facility] Birdie Sons, MD   3 months ago Bacterial sinusitis   [facility] Gwyneth Sprout, FNP       Future Appointments             In 3 months Fisher, [name], MD [facility]               Requested Prescriptions  Pending Prescriptions Disp Refills   oxyCODONE-acetaminophen (PERCOCET) 10-325 MG tablet 60 tablet 0    Sig: Take 1 tablet by mouth every 6 (six) hours as needed for pain.     Not Delegated - Analgesics:  Opioid Agonist Combinations Failed - 10/22/2021  9:02 AM      Failed - This refill cannot be delegated      Failed - Urine Drug Screen completed in last 360 days      Passed - Valid encounter within last 3 months    Recent Outpatient Visits           2 days ago Pilonidal cyst   [facility] Birdie Sons, MD   1 month ago Pilonidal  cyst   [facility] Birdie Sons, MD   2 months ago Recurrent acute deep vein thrombosis (DVT) of right lower extremity [facility])   [facility] Birdie Sons, MD   2 months ago Hypothyroidism, unspecified type   [facility] Birdie Sons, MD   3 months ago Bacterial sinusitis   [facility] Gwyneth Sprout, FNP       Future Appointments             In 3 months Fisher, [name], MD [facility], [place]

## 2021-10-22 NOTE — Telephone Encounter (Signed)
Copied from [place] [id number]. Topic: General - Other >> Oct 22, 2021 11:48 AM Everette C wrote: Reason for CRM: The patient would like to speak with a member of staff about their oxyCODONE-acetaminophen (PERCOCET) 10-325 MG tablet [[id number]]  prescription   The patient is normally prescribed the medication with a quantity of 120 tablets   The patient has only received 60 tablets with their latest refill   Please contact the patient further when possible

## 2021-11-03 ENCOUNTER — Other Ambulatory Visit: Payer: Self-pay | Admitting: Family Medicine

## 2021-11-03 DIAGNOSIS — G589 Mononeuropathy, unspecified: Secondary | ICD-10-CM

## 2021-11-03 DIAGNOSIS — M5410 Radiculopathy, site unspecified: Secondary | ICD-10-CM

## 2021-11-03 NOTE — Telephone Encounter (Signed)
[place] Telephone Encounter Signed Creation Time:  11/03/2021  3:14 PM   Signed        [place], [place] [address]  Phone: [phone] Fax: [phone]   per pt go ahead and do the 60   oxyCODONE-acetaminophen (PERCOCET) 10-325 MG tablet  and he will talk to Dr Caryn Section when he gets back. [phone]

## 2021-11-03 NOTE — Telephone Encounter (Signed)
Medication Refill - Medication: oxyCODONE-acetaminophen (PERCOCET) 10-325 MG tablet / pt asked for 120 tabs instead of 60 so that he doesn't have to go to the pharmacy so often / please advise   Has the patient contacted their pharmacy? No. (Agent: If no, request that the patient contact the pharmacy for the refill. If patient does not wish to contact the pharmacy document the reason why and proceed with request.) (Agent: If yes, when and what did the pharmacy advise?)  Preferred Pharmacy (with phone number or street name): [facility] [place], [address] [place] [address]  Phone:  [phone]  Fax:  [phone]  Has the patient been seen for an appointment in the last year OR does the patient have an upcoming appointment? Yes.    Agent: Please be advised that RX refills may take up to 3 business days. We ask that you follow-up with your pharmacy.

## 2021-11-03 NOTE — Telephone Encounter (Signed)
Message below was supposed to be attached to refill request from today. Message added to refill requested.

## 2021-11-03 NOTE — Telephone Encounter (Signed)
  [place], [place] [address]  Phone: [phone] Fax: [phone]   per pt go ahead and do the 60   oxyCODONE-acetaminophen (PERCOCET) 10-325 MG tablet  and he will talk to Dr Caryn Section when he gets back. [phone]

## 2021-11-04 ENCOUNTER — Other Ambulatory Visit: Payer: Self-pay | Admitting: Physician Assistant

## 2021-11-04 DIAGNOSIS — G589 Mononeuropathy, unspecified: Secondary | ICD-10-CM

## 2021-11-04 DIAGNOSIS — M5410 Radiculopathy, site unspecified: Secondary | ICD-10-CM

## 2021-11-04 MED ORDER — OXYCODONE-ACETAMINOPHEN 10-325 MG PO TABS
1.0000 | ORAL_TABLET | Freq: Four times a day (QID) | ORAL | 0 refills | Status: DC | PRN
Start: 2021-11-06 — End: 2021-11-17

## 2021-11-04 NOTE — Progress Notes (Signed)
Pt asking for medication early. States he has not run out yet though. Last fill was #60 7/13. Pt also asking for more per month.

## 2021-11-05 ENCOUNTER — Other Ambulatory Visit: Payer: Self-pay | Admitting: Family Medicine

## 2021-11-05 DIAGNOSIS — E039 Hypothyroidism, unspecified: Secondary | ICD-10-CM

## 2021-11-17 ENCOUNTER — Other Ambulatory Visit: Payer: Self-pay | Admitting: Family Medicine

## 2021-11-17 DIAGNOSIS — M5410 Radiculopathy, site unspecified: Secondary | ICD-10-CM

## 2021-11-17 DIAGNOSIS — G589 Mononeuropathy, unspecified: Secondary | ICD-10-CM

## 2021-11-17 MED ORDER — OXYCODONE-ACETAMINOPHEN 10-325 MG PO TABS
1.0000 | ORAL_TABLET | Freq: Four times a day (QID) | ORAL | 0 refills | Status: DC | PRN
Start: 2021-11-17 — End: 2021-11-19

## 2021-11-17 NOTE — Telephone Encounter (Signed)
Requested medication (s) are due for refill today - provider review   Requested medication (s) are on the active medication list -yes  Future visit scheduled -yes  Last refill: 11/06/21 #60  Notes to clinic: non delegated Rx  Requested Prescriptions  Pending Prescriptions Disp Refills   oxyCODONE-acetaminophen (PERCOCET) 10-325 MG tablet 60 tablet 0    Sig: Take 1 tablet by mouth every 6 (six) hours as needed for pain.     Not Delegated - Analgesics:  Opioid Agonist Combinations Failed - 11/17/2021  8:52 AM      Failed - This refill cannot be delegated      Failed - Urine Drug Screen completed in last 360 days      Passed - Valid encounter within last 3 months    Recent Outpatient Visits           4 weeks ago Pilonidal cyst   [facility] Birdie Sons, MD   2 months ago Pilonidal cyst   [facility] Birdie Sons, MD   3 months ago Recurrent acute deep vein thrombosis (DVT) of right lower extremity [facility])   [facility] Birdie Sons, MD   3 months ago Hypothyroidism, unspecified type   [facility] Birdie Sons, MD   4 months ago Bacterial sinusitis   [facility] Gwyneth Sprout, FNP       Future Appointments             In 2 months Fisher, [name], MD [facility], [facility]               Requested Prescriptions  Pending Prescriptions Disp Refills   oxyCODONE-acetaminophen (PERCOCET) 10-325 MG tablet 60 tablet 0    Sig: Take 1 tablet by mouth every 6 (six) hours as needed for pain.     Not Delegated - Analgesics:  Opioid Agonist Combinations Failed - 11/17/2021  8:52 AM      Failed - This refill cannot be delegated      Failed - Urine Drug Screen completed in last 360 days      Passed - Valid encounter within last 3 months    Recent Outpatient Visits           4 weeks ago Pilonidal cyst   [facility] Birdie Sons, MD   2 months ago Pilonidal  cyst   [facility] Birdie Sons, MD   3 months ago Recurrent acute deep vein thrombosis (DVT) of right lower extremity [facility])   [facility] Birdie Sons, MD   3 months ago Hypothyroidism, unspecified type   [facility] Birdie Sons, MD   4 months ago Bacterial sinusitis   [facility] Gwyneth Sprout, FNP       Future Appointments             In 2 months Fisher, [name], MD [facility], [place]

## 2021-11-17 NOTE — Telephone Encounter (Signed)
Medication Refill - Medication: oxyCODONE-acetaminophen (PERCOCET) 10-325 MG tablet  Pt is asking if this can be switched back to 30 days for 120 tablets.   Has the patient contacted their pharmacy? No. No, more refills.   (Agent: If no, request that the patient contact the pharmacy for the refill. If patient does not wish to contact the pharmacy document the reason why and proceed with request.)   Preferred Pharmacy (with phone number or street name):  [facility] [place], [place] [address]  Phone: [phone] Fax: [phone]  Hours: Not open 24 hours   Has the patient been seen for an appointment in the last year OR does the patient have an upcoming appointment? Yes.    Agent: Please be advised that RX refills may take up to 3 business days. We ask that you follow-up with your pharmacy.

## 2021-11-19 ENCOUNTER — Ambulatory Visit: Payer: Self-pay | Admitting: *Deleted

## 2021-11-19 ENCOUNTER — Other Ambulatory Visit: Payer: Self-pay

## 2021-11-19 DIAGNOSIS — G589 Mononeuropathy, unspecified: Secondary | ICD-10-CM

## 2021-11-19 DIAGNOSIS — M5410 Radiculopathy, site unspecified: Secondary | ICD-10-CM

## 2021-11-19 NOTE — Patient Outreach (Signed)
  Care Coordination   Initial Visit Note   11/19/2021 Name: Jerry Mcpherson MRN: [id number] DOB: Dec 02, 1940  Jerry Mcpherson is a 81 y.o. year old male who sees Fisher, [name], MD for primary care. I spoke with  Jerry Mcpherson by phone today  What matters to the patients health and wellness today?  Patient denies having any medical or community resource needs at this time    Goals Addressed               This Visit's Progress     care coordination services (pt-stated)        Care Coordination Interventions:  Needs assessment completed/SDOH completed Case Management Program discussed Patient currently homeless, lives in his truck confirms that he is working with the Department of Social Services to find housing-Section 8 application submitted Patient declines stay in boarding house or the [place] remain in truck until "something else opens up" Patient denies having any other needs at this time Next PCP appointment scheduled for 02/05/22, patient reminded to schedule his AWV        SDOH assessments and interventions completed:  Yes  SDOH Interventions Today    Flowsheet Row Most Recent Value  SDOH Interventions   Food Insecurity Interventions Intervention Not Indicated  Financial Strain Interventions Intervention Not Indicated  Housing Interventions Other (Comment)  [applied for Section 8-currently on waiting list]  Physical Activity Interventions Intervention Not Indicated  Stress Interventions Intervention Not Indicated  Social Connections Interventions Intervention Not Indicated  Transportation Interventions Intervention Not Indicated        Care Coordination Interventions Activated:  Yes  Care Coordination Interventions:  Yes, provided   Follow up plan: No further intervention required.   Encounter Outcome:  Pt. Visit Completed

## 2021-11-19 NOTE — Patient Instructions (Signed)
Visit Information  Thank you for taking time to visit with me today. Please don't hesitate to contact me if I can be of assistance to you.   Following are the goals we discussed today:   Goals Addressed               This Visit's Progress     care coordination services (pt-stated)        Care Coordination Interventions:  Needs assessment completed/SDOH completed Case Management Program discussed Patient currently homeless, lives in his truck confirms that he is working with the Department of Social Services to find housing-Section 8 application submitted Patient declines stay in boarding house or the [place] remain in truck until "something else opens up" Patient denies having any other needs at this time Next PCP appointment scheduled for 02/05/22, patient reminded to schedule his AWV         Please call the care guide team at [phone] if you need to cancel or reschedule your appointment.   If you are experiencing a Mental Health or [place] or need someone to talk to, please call the Suicide and Crisis Lifeline: 988   Patient verbalizes understanding of instructions and care plan provided today and agrees to view in [place]. Active MyChart status and patient understanding of how to access instructions and care plan via MyChart confirmed with patient.     Next PCP appointment scheduled for:  02/05/22   Elliot Gurney, [place] Worker  [facility] Care Management [phone]

## 2021-11-19 NOTE — Telephone Encounter (Signed)
Pt called to see if his refill for OxyCODONE was sent to the pharmacy / pt was advised it was sent and he asked if it was for 120 tabs/ it was advised it was for 60/ pt asked if Dr. Caryn Section could change it back to 120 to save him trips and if he can do so before he goes to pick up meds tomorrow / please advise

## 2021-11-19 NOTE — Telephone Encounter (Signed)
Which pharmacy?

## 2021-11-19 NOTE — Telephone Encounter (Signed)
Copied from [place] [id number]. Topic: General - Other >> Nov 19, 2021 10:12 AM Cyndi Bender wrote: Reason for CRM: Pt requests that the Rx for oxyCODONE-acetaminophen (PERCOCET) 10-325 MG tablet be changed to 120 tablets

## 2021-11-19 NOTE — Addendum Note (Signed)
Addended by: Birdie Sons on: 11/19/2021 10:46 AM   Modules accepted: Orders

## 2021-11-20 MED ORDER — OXYCODONE-ACETAMINOPHEN 10-325 MG PO TABS: 1.0000 | ORAL_TABLET | Freq: Four times a day (QID) | ORAL | 0 refills | Status: DC | PRN

## 2021-11-20 NOTE — Telephone Encounter (Signed)
Patient wants to use [facility]. Patient wants to know if the rx can be changed back to 120 tablets instead of 60?

## 2021-11-20 NOTE — Addendum Note (Signed)
Addended by: Birdie Sons on: 11/20/2021 09:25 AM   Modules accepted: Orders

## 2021-11-20 NOTE — Addendum Note (Signed)
Addended by: Julieta Bellini on: 11/20/2021 08:20 AM   Modules accepted: Orders

## 2021-11-23 ENCOUNTER — Telehealth: Payer: Self-pay | Admitting: Family Medicine

## 2021-11-23 DIAGNOSIS — I82411 Acute embolism and thrombosis of right femoral vein: Secondary | ICD-10-CM

## 2021-11-23 NOTE — Telephone Encounter (Signed)
Patient called in asking if any more samples of the Eloquis are available at the office to come pick up. He states the medicine is too expensive to get at the pharmacy and the provider was generous to provide him with samples last time.  Please assist patient further

## 2021-11-23 NOTE — Telephone Encounter (Signed)
We have 3 boxes available of the Eliquis '5mg'$  dose. Each sample box contains 14 tablets. Please advise if ok to give samples, and if so, how many.

## 2021-11-24 MED ORDER — ELIQUIS 5 MG PO TABS: 5.0000 mg | ORAL_TABLET | Freq: Two times a day (BID) | ORAL | 0 refills | Status: DC

## 2021-11-24 NOTE — Telephone Encounter (Signed)
Patient advised. Samples placed up front for pick up.

## 2021-11-24 NOTE — Telephone Encounter (Signed)
Please get 3 boxes of Eliquis for him. Thanks!

## 2021-11-26 DIAGNOSIS — D45 Polycythemia vera: Secondary | ICD-10-CM | POA: Diagnosis not present

## 2021-11-26 DIAGNOSIS — I10 Essential (primary) hypertension: Secondary | ICD-10-CM | POA: Diagnosis not present

## 2021-11-26 DIAGNOSIS — Z7901 Long term (current) use of anticoagulants: Secondary | ICD-10-CM | POA: Diagnosis not present

## 2021-11-26 DIAGNOSIS — Z87891 Personal history of nicotine dependence: Secondary | ICD-10-CM | POA: Diagnosis not present

## 2021-11-28 IMAGING — CR DG ELBOW COMPLETE 3+V*R*
1 series · 4 of 4 positions shown · non-contrast
Comparison: None.

CLINICAL DATA: Posterior and medial right elbow pain and swelling
after recent fall

EXAM:
RIGHT ELBOW - COMPLETE 3+ VIEW

[Series 1: dg elbow complete right (3+view) · 0.14mm/px · 4 of 4 slices shown]
[im 1/4]
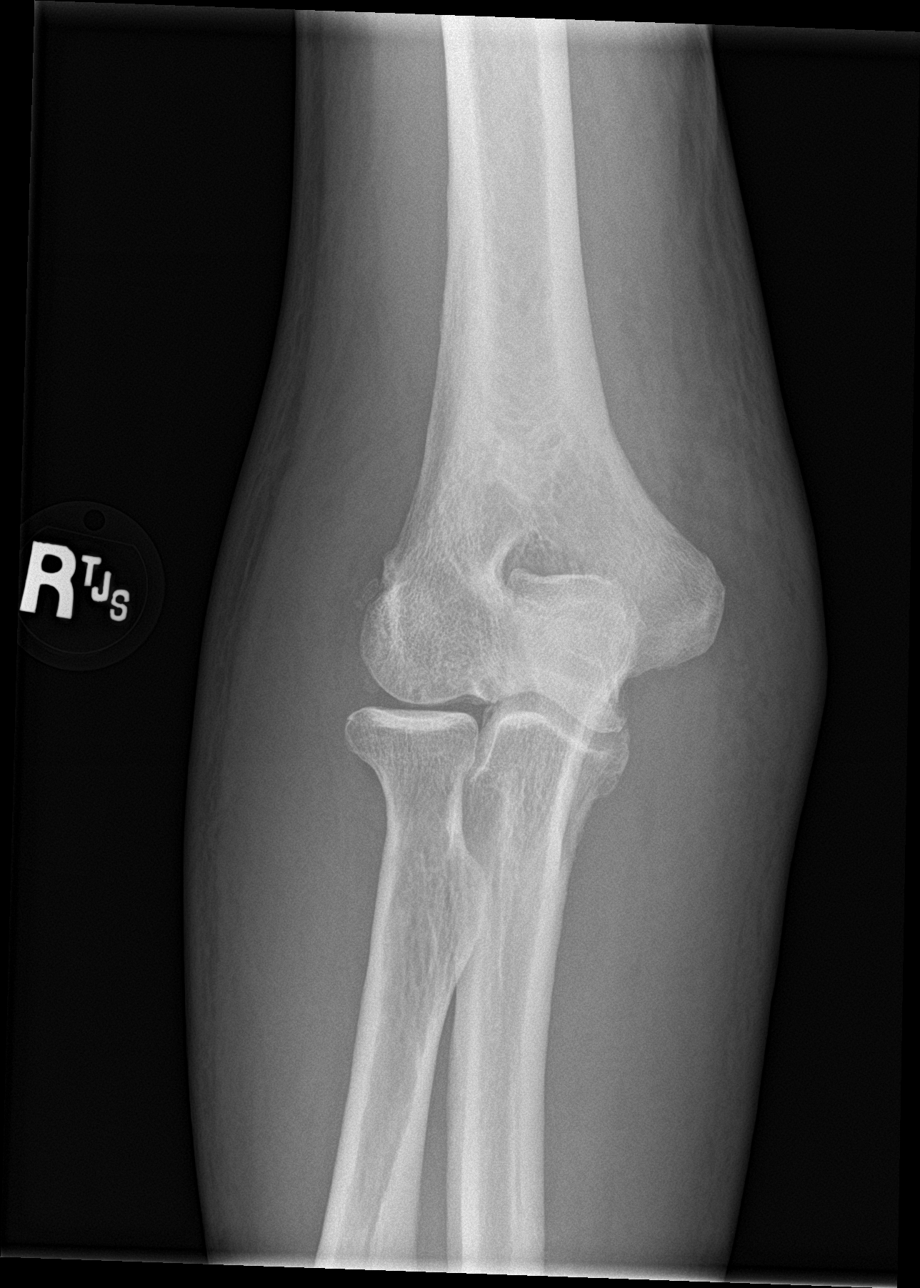
[im 2/4]
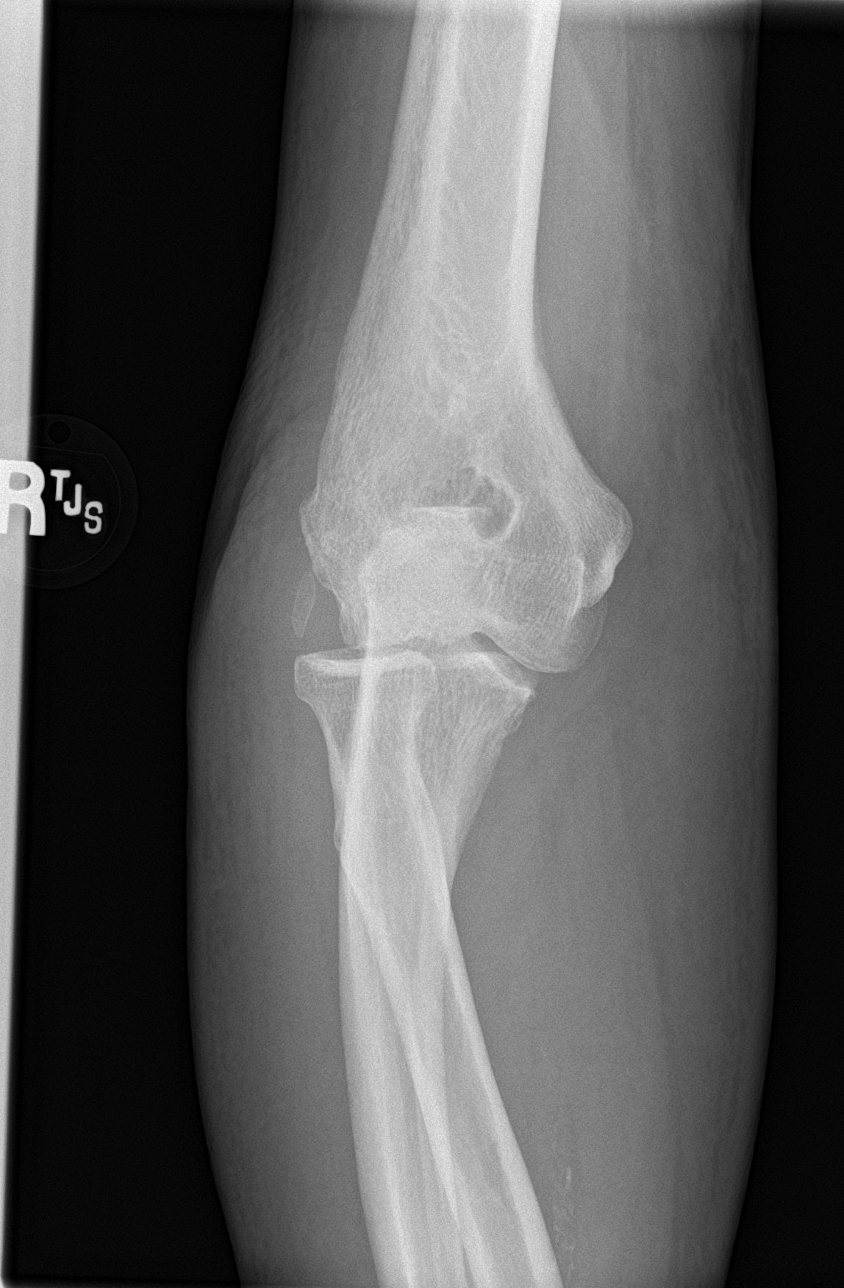
[im 3/4]
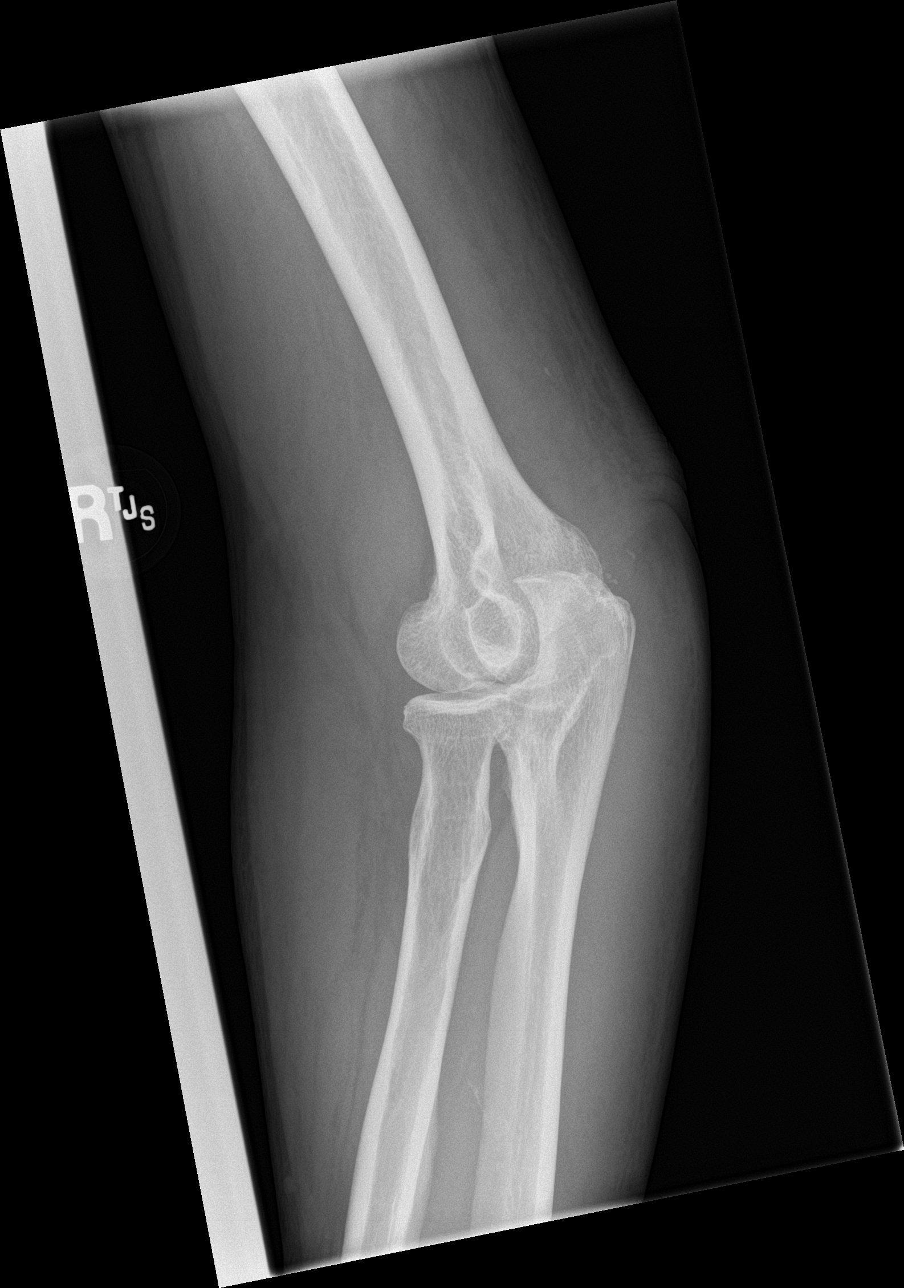
[im 4/4]
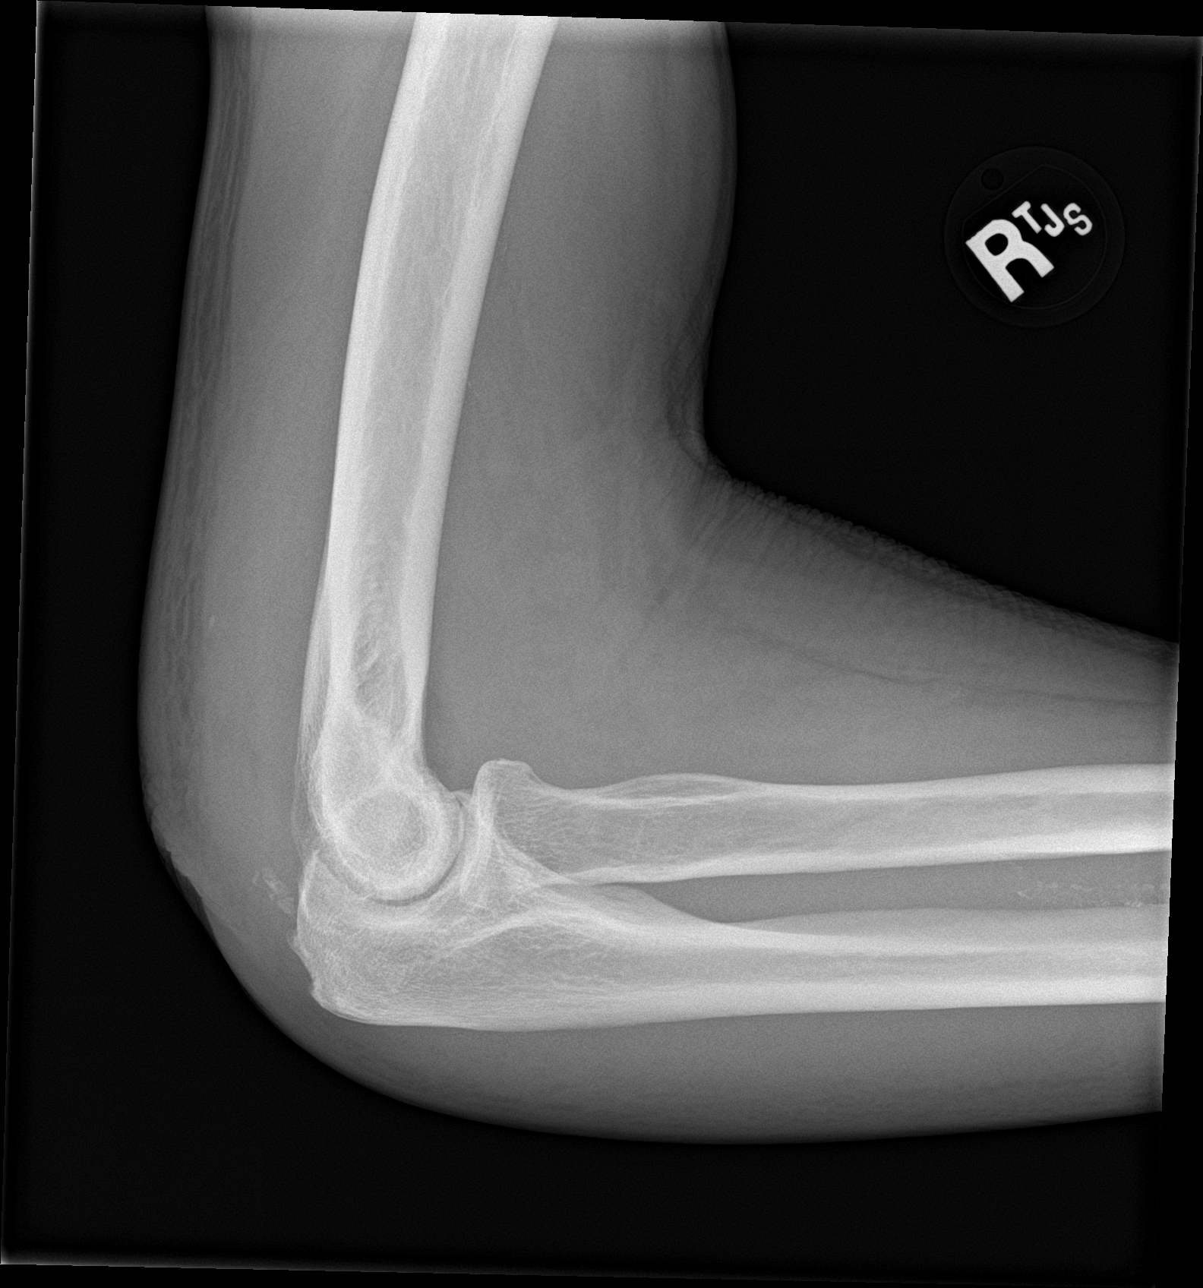

[4 of 4 positions shown; findings below may reference images not displayed]

FINDINGS: No evidence of acute fracture or dislocation. No appreciable elbow
joint effusion. Well corticated osseous density adjacent to the
lateral humeral epicondyle likely reflecting sequela of prior
trauma. Mineralization in the region of the triceps insertion
suggests enthesopathy. There is prominent soft tissue swelling along
the posterior and medial aspects of the elbow. Vascular
calcification is seen.
IMPRESSION: 1. No acute fracture or dislocation of the right elbow.
2. Prominent soft tissue swelling along the posterior and medial
aspects of the elbow.
3. Olecranon enthesopathy.

## 2021-12-01 ENCOUNTER — Other Ambulatory Visit: Payer: Self-pay | Admitting: Family Medicine

## 2021-12-01 DIAGNOSIS — G589 Mononeuropathy, unspecified: Secondary | ICD-10-CM

## 2021-12-01 DIAGNOSIS — M5410 Radiculopathy, site unspecified: Secondary | ICD-10-CM

## 2021-12-01 NOTE — Telephone Encounter (Signed)
Patient is asking that you call in #120 Oxycodone on Friday(12/04/21) when is RX is due for refill to [facility].

## 2021-12-04 MED ORDER — OXYCODONE-ACETAMINOPHEN 10-325 MG PO TABS: 1.0000 | ORAL_TABLET | Freq: Four times a day (QID) | ORAL | 0 refills | Status: DC | PRN

## 2021-12-07 DIAGNOSIS — C679 Malignant neoplasm of bladder, unspecified: Secondary | ICD-10-CM | POA: Diagnosis not present

## 2021-12-07 DIAGNOSIS — Z87891 Personal history of nicotine dependence: Secondary | ICD-10-CM | POA: Diagnosis not present

## 2021-12-07 DIAGNOSIS — R202 Paresthesia of skin: Secondary | ICD-10-CM | POA: Diagnosis not present

## 2021-12-07 DIAGNOSIS — R310 Gross hematuria: Secondary | ICD-10-CM | POA: Diagnosis not present

## 2021-12-07 DIAGNOSIS — N289 Disorder of kidney and ureter, unspecified: Secondary | ICD-10-CM | POA: Diagnosis not present

## 2021-12-07 DIAGNOSIS — R000 Tachycardia, unspecified: Secondary | ICD-10-CM | POA: Diagnosis not present

## 2021-12-07 DIAGNOSIS — D45 Polycythemia vera: Secondary | ICD-10-CM | POA: Diagnosis not present

## 2021-12-07 DIAGNOSIS — C678 Malignant neoplasm of overlapping sites of bladder: Secondary | ICD-10-CM | POA: Diagnosis not present

## 2021-12-07 DIAGNOSIS — R918 Other nonspecific abnormal finding of lung field: Secondary | ICD-10-CM | POA: Diagnosis not present

## 2021-12-09 DIAGNOSIS — H353131 Nonexudative age-related macular degeneration, bilateral, early dry stage: Secondary | ICD-10-CM | POA: Diagnosis not present

## 2021-12-09 DIAGNOSIS — H2511 Age-related nuclear cataract, right eye: Secondary | ICD-10-CM | POA: Diagnosis not present

## 2022-01-01 ENCOUNTER — Other Ambulatory Visit: Payer: Self-pay | Admitting: Family Medicine

## 2022-01-01 DIAGNOSIS — G589 Mononeuropathy, unspecified: Secondary | ICD-10-CM

## 2022-01-01 DIAGNOSIS — M5410 Radiculopathy, site unspecified: Secondary | ICD-10-CM

## 2022-01-01 NOTE — Telephone Encounter (Signed)
Medication Refill - Medication: Patient is out today and would like to pick up oxyCODONE-acetaminophen (PERCOCET) 10-325 MG tablet tomorrow. Patient would like request expedited      Preferred Pharmacy (with phone number or street name):  [place], [place]. Phone:  [phone]  Fax:  [phone]      Has the patient been seen for an appointment in the last year OR does the patient have an upcoming appointment? Yes.    Agent: Please be advised that RX refills may take up to 3 business days. We ask that you follow-up with your pharmacy.

## 2022-01-01 NOTE — Telephone Encounter (Signed)
Requested medication (s) are due for refill today: Yes  Requested medication (s) are on the active medication list: Yes  Last refill:  12/04/21  Future visit scheduled: Yes  Notes to clinic:  Unable to refill per protocol, cannot delegate.      Requested Prescriptions  Pending Prescriptions Disp Refills   oxyCODONE-acetaminophen (PERCOCET) 10-325 MG tablet 120 tablet 0    Sig: Take 1 tablet by mouth every 6 (six) hours as needed for pain.     Not Delegated - Analgesics:  Opioid Agonist Combinations Failed - 01/01/2022 10:37 AM      Failed - This refill cannot be delegated      Failed - Urine Drug Screen completed in last 360 days      Passed - Valid encounter within last 3 months    Recent Outpatient Visits           2 months ago Pilonidal cyst   [facility] Birdie Sons, MD   3 months ago Pilonidal cyst   [facility] Birdie Sons, MD   4 months ago Recurrent acute deep vein thrombosis (DVT) of right lower extremity [facility])   [facility] Birdie Sons, MD   5 months ago Hypothyroidism, unspecified type   [facility] Birdie Sons, MD   5 months ago Bacterial sinusitis   [facility] Gwyneth Sprout, FNP       Future Appointments             In 1 month Fisher, [name], MD [facility] [place] [place]

## 2022-01-02 MED ORDER — OXYCODONE-ACETAMINOPHEN 10-325 MG PO TABS: 1.0000 | ORAL_TABLET | Freq: Four times a day (QID) | ORAL | 0 refills | Status: DC | PRN

## 2022-01-05 ENCOUNTER — Other Ambulatory Visit: Payer: Self-pay | Admitting: Family Medicine

## 2022-01-05 ENCOUNTER — Other Ambulatory Visit: Payer: Self-pay

## 2022-01-05 DIAGNOSIS — I4892 Unspecified atrial flutter: Secondary | ICD-10-CM

## 2022-01-05 MED ORDER — METOPROLOL SUCCINATE ER 50 MG PO TB24: 50.0000 mg | ORAL_TABLET | Freq: Every day | ORAL | 2 refills | Status: DC

## 2022-01-05 NOTE — Addendum Note (Signed)
Addended by: Birdie Sons on: 01/05/2022 02:03 PM   Modules accepted: Orders

## 2022-01-05 NOTE — Telephone Encounter (Signed)
[facility] fax[facility] refill request for the following medications:   amLODipine (NORVASC) 2.5 MG tablet   Please advise.

## 2022-01-06 ENCOUNTER — Other Ambulatory Visit: Payer: Self-pay

## 2022-01-06 ENCOUNTER — Other Ambulatory Visit: Payer: Self-pay | Admitting: Family Medicine

## 2022-01-06 DIAGNOSIS — I10 Essential (primary) hypertension: Secondary | ICD-10-CM

## 2022-01-06 MED ORDER — AMLODIPINE BESYLATE 2.5 MG PO TABS: ORAL_TABLET | ORAL | 4 refills | Status: DC

## 2022-01-06 NOTE — Telephone Encounter (Signed)
Requested medication (s) are due for refill today: expired medication  Requested medication (s) are on the active medication list: yes  Last refill:  12/11/20 #90 4 refills  Future visit scheduled: yes in 1 month  Notes to clinic:  expired medication. Do you want to give #30 or can patient get #90 ?     Requested Prescriptions  Pending Prescriptions Disp Refills   amLODipine (NORVASC) 2.5 MG tablet 90 tablet 4     Cardiovascular: Calcium Channel Blockers 2 Passed - 01/06/2022  3:37 PM      Passed - Last BP in normal range    BP Readings from Last 1 Encounters:  10/20/21 136/82         Passed - Last Heart Rate in normal range    Pulse Readings from Last 1 Encounters:  10/20/21 61         Passed - Valid encounter within last 6 months    Recent Outpatient Visits           2 months ago Pilonidal cyst   [facility] Birdie Sons, MD   3 months ago Pilonidal cyst   [facility] Birdie Sons, MD   4 months ago Recurrent acute deep vein thrombosis (DVT) of right lower extremity [facility])   [facility] Birdie Sons, MD   5 months ago Hypothyroidism, unspecified type   [facility] Birdie Sons, MD   6 months ago Bacterial sinusitis   [facility] Gwyneth Sprout, FNP       Future Appointments             In 1 month Fisher, [name], MD [facility] [place]

## 2022-01-06 NOTE — Telephone Encounter (Signed)
Medication Refill - Medication: amLODipine (NORVASC) 2.5 MG tablet  "Pt says he has two pills left, says this is stupid that there are no refills left".   Has the patient contacted their pharmacy? Yes.   (Agent: If no, request that the patient contact the pharmacy for the refill. If patient does not wish to contact the pharmacy document the reason why and proceed with request.) (Agent: If yes, when and what did the pharmacy advise?)  Preferred Pharmacy (with phone number or street name):  [id number] [place] [address] [id number] Phone: [phone]: [phone] Hours: Not open 24 hours   Has the patient been seen for an appointment in the last year OR does the patient have an upcoming appointment? Yes.    Agent: Please be advised that RX refills may take up to 3 business days. We ask that you follow-up with your pharmacy.

## 2022-01-14 DIAGNOSIS — Z7901 Long term (current) use of anticoagulants: Secondary | ICD-10-CM | POA: Diagnosis not present

## 2022-01-14 DIAGNOSIS — D45 Polycythemia vera: Secondary | ICD-10-CM | POA: Diagnosis not present

## 2022-01-14 LAB — CBC AND DIFFERENTIAL
HCT: 46 (ref 41–53)
Hemoglobin: 13.5 (ref 13.5–17.5)
Platelets: 531 10*3/uL — AB (ref 150–400)
WBC: 15.5

## 2022-01-14 LAB — BASIC METABOLIC PANEL
BUN: 42 — AB (ref 4–21)
CO2: 19 (ref 13–22)
Chloride: 110 — AB (ref 99–108)
Creatinine: 1.9 — AB (ref 0.6–1.3)
Glucose: 93
Potassium: 4.4 mEq/L (ref 3.5–5.1)
Sodium: 141 (ref 137–147)

## 2022-01-14 LAB — COMPREHENSIVE METABOLIC PANEL: eGFR: 35

## 2022-01-29 ENCOUNTER — Other Ambulatory Visit: Payer: Self-pay | Admitting: Family Medicine

## 2022-01-29 DIAGNOSIS — M5410 Radiculopathy, site unspecified: Secondary | ICD-10-CM

## 2022-01-29 DIAGNOSIS — G589 Mononeuropathy, unspecified: Secondary | ICD-10-CM

## 2022-01-29 MED ORDER — OXYCODONE-ACETAMINOPHEN 10-325 MG PO TABS: 1.0000 | ORAL_TABLET | Freq: Four times a day (QID) | ORAL | 0 refills | Status: DC | PRN

## 2022-01-29 NOTE — Telephone Encounter (Signed)
Requested medication (s) are due for refill today:   Provider to review  Requested medication (s) are on the active medication list:   Yes  Future visit scheduled:   Yes with Dr. Caryn Section in 1 wk.   Last ordered: 01/02/2022 #120, 0 refills  Non delegated refill reason returned   Requested Prescriptions  Pending Prescriptions Disp Refills   oxyCODONE-acetaminophen (PERCOCET) 10-325 MG tablet 120 tablet 0    Sig: Take 1 tablet by mouth every 6 (six) hours as needed for pain.     Not Delegated - Analgesics:  Opioid Agonist Combinations Failed - 01/29/2022  9:32 AM      Failed - This refill cannot be delegated      Failed - Urine Drug Screen completed in last 360 days      Failed - Valid encounter within last 3 months    Recent Outpatient Visits           3 months ago Pilonidal cyst   [facility] Birdie Sons, MD   4 months ago Pilonidal cyst   [facility] Birdie Sons, MD   5 months ago Recurrent acute deep vein thrombosis (DVT) of right lower extremity [facility])   [facility] Birdie Sons, MD   6 months ago Hypothyroidism, unspecified type   [facility] Birdie Sons, MD   6 months ago Bacterial sinusitis   [facility] Gwyneth Sprout, FNP       Future Appointments             In 1 week Fisher, [name], MD [facility], [place]

## 2022-01-29 NOTE — Telephone Encounter (Signed)
Medication Refill - Medication: oxyCODONE-acetaminophen (PERCOCET) 10-325 MG tablet [[id number]]  Has the patient contacted their pharmacy? Yes.   (Agent: If no, request that the patient contact the pharmacy for the refill. If patient does not wish to contact the pharmacy document the reason why and proceed with request.) (Agent: If yes, when and what did the pharmacy advise?)  Preferred Pharmacy (with phone number or street name):  [facility] [place], [place] [address]  Phone: [phone] Fax: [phone]  Hours: Not open 24 hours   Has the patient been seen for an appointment in the last year OR does the patient have an upcoming appointment? Yes.    Agent: Please be advised that RX refills may take up to 3 business days. We ask that you follow-up with your pharmacy.

## 2022-01-30 ENCOUNTER — Other Ambulatory Visit: Payer: Self-pay | Admitting: Family Medicine

## 2022-01-30 DIAGNOSIS — M5410 Radiculopathy, site unspecified: Secondary | ICD-10-CM

## 2022-01-30 DIAGNOSIS — G589 Mononeuropathy, unspecified: Secondary | ICD-10-CM

## 2022-02-04 NOTE — Progress Notes (Unsigned)
I,Roshena L Chambers,acting as a scribe for Lelon Huh, MD.,have documented all relevant documentation on the behalf of Lelon Huh, MD,as directed by  Lelon Huh, MD while in the presence of Lelon Huh, MD.    Established patient visit   Patient: Jerry Mcpherson   DOB: 02-Mar-1941   81 y.o. Male  MRN: [id number] Visit Date: 02/05/2022  Today's healthcare provider: Lelon Huh, MD   Chief Complaint  Patient presents with   Hypothyroidism   Subjective    HPI  Hypothyroid, follow-up  Lab Results  Component Value Date   TSH 4.980 (H) 07/27/2021   TSH 6.710 (H) 01/23/2021   TSH 4.570 (H) 08/02/2018   FREET4 0.92 07/27/2021   FREET4 1.14 08/02/2018    Wt Readings from Last 3 Encounters:  02/05/22 152 lb (68.9 kg)  10/20/21 160 lb (72.6 kg)  09/18/21 165 lb (74.8 kg)    He was last seen for hypothyroid 6 months ago.  Management since that visit includes continue same dose of levothyroxine. He reports good compliance with treatment. He is not having side effects.   Symptoms: Yes change in energy level No constipation  No diarrhea No heat / cold intolerance  No nervousness No palpitations  Yes weight changes    -----------------------------------------------------------------------------------------   Medications: Outpatient Medications Prior to Visit  Medication Sig   amLODipine (NORVASC) 2.5 MG tablet TAKE ONE (1) TABLET BY MOUTH ONCE DAILY   Ascorbic Acid (VITAMIN C) 1000 MG tablet Take 2 tablets by mouth daily.   aspirin 81 MG tablet Take 2 tablets daily   budesonide-formoterol (SYMBICORT) 160-4.5 MCG/ACT inhaler Inhale 2 puffs into the lungs 2 (two) times daily.   cyproheptadine (PERIACTIN) 4 MG tablet TAKE 1/2-1 TABLET BY MOUTH THREE TIMES DAILY AS NEEDED TO HELP APPETITE   diclofenac Sodium (VOLTAREN) 1 % GEL Apply 4 g topically 4 (four) times daily as needed.   docusate sodium (COLACE) 100 MG capsule Take 1 capsule by mouth daily as  needed.   ELIQUIS 5 MG TABS tablet Take 1 tablet (5 mg total) by mouth 2 (two) times daily.   fluticasone (FLONASE) 50 MCG/ACT nasal spray Place 2 sprays into both nostrils daily.   hydrochlorothiazide (HYDRODIURIL) 25 MG tablet Take 1 tablet (25 mg total) by mouth daily.   hydroxyurea (HYDREA) 500 MG capsule Take 500 mg by mouth as directed. 2 tablets 3 days a week, and 1 tablet all other days   levothyroxine (SYNTHROID) 75 MCG tablet TAKE 1 TABLET BY MOUTH EVERY DAY   metoprolol succinate (TOPROL-XL) 50 MG 24 hr tablet Take 1 tablet (50 mg total) by mouth daily.   Multiple Vitamins-Minerals (MULTIVITAMIN ADULT PO) Take 1 tablet by mouth daily.   mupirocin cream (BACTROBAN) 2 % Apply 1 application  topically 2 (two) times daily.   naproxen (NAPROSYN) 500 MG tablet TAKE 1 TABLET(500 MG) BY MOUTH TWICE DAILY WITH A MEAL   oxyCODONE-acetaminophen (PERCOCET) 10-325 MG tablet Take 1 tablet by mouth every 6 (six) hours as needed for pain.   potassium chloride (KLOR-CON) 10 MEQ tablet Take by mouth.   promethazine (PHENERGAN) 12.5 MG tablet TAKE 1 TO 2 TABLETS(12.5 TO 25 MG) BY MOUTH EVERY 8 HOURS AS NEEDED FOR NAUSEA OR VOMITING   sildenafil (VIAGRA) 50 MG tablet Take 0.5-1 tablets by mouth daily as needed.   valACYclovir (VALTREX) 1000 MG tablet TAKE 1 TABLET(1000 MG) BY MOUTH TWICE DAILY AS NEEDED   No facility-administered medications prior to visit.  Review of Systems  Constitutional:  Positive for fatigue and unexpected weight change. Negative for appetite change, chills and fever.  Respiratory:  Negative for chest tightness, shortness of breath and wheezing.   Cardiovascular:  Negative for chest pain and palpitations.  Gastrointestinal:  Negative for abdominal pain, nausea and vomiting.    {Labs  Heme  Chem  Endocrine  Serology  Results Review (optional):23779}   Objective    BP 137/83 (BP Location: Right Arm, Patient Position: Sitting, Cuff Size: Normal)   Pulse 77   Temp (!)  97.5 F (36.4 C) (Oral)   Resp 18   Wt 152 lb (68.9 kg)   SpO2 99% Comment: room air  BMI 23.11 kg/m  {Show previous vital signs (optional):23777}  Physical Exam  ***  No results found for any visits on 02/05/22.  Assessment & Plan     ***  No follow-ups on file.      {provider attestation***:1}   Lelon Huh, MD  [facility] [phone] (phone) [phone] (fax)  [place]

## 2022-02-05 ENCOUNTER — Ambulatory Visit (INDEPENDENT_AMBULATORY_CARE_PROVIDER_SITE_OTHER): Payer: Medicare PPO | Admitting: Family Medicine

## 2022-02-05 ENCOUNTER — Encounter: Payer: Self-pay | Admitting: Family Medicine

## 2022-02-05 VITALS — BP 137/83 | HR 77 | Temp 97.5°F | Resp 18 | Wt 152.0 lb

## 2022-02-05 DIAGNOSIS — Z23 Encounter for immunization: Secondary | ICD-10-CM

## 2022-02-05 DIAGNOSIS — E039 Hypothyroidism, unspecified: Secondary | ICD-10-CM | POA: Diagnosis not present

## 2022-02-05 DIAGNOSIS — I4892 Unspecified atrial flutter: Secondary | ICD-10-CM

## 2022-02-05 NOTE — Patient Instructions (Signed)
Please review the attached list of medications and notify my office if there are any errors.   You can try over the counter vitamin B12, 1,000 units once a day to help with your energy level

## 2022-02-07 IMAGING — US US RENAL
1 series · 13 of 25 positions shown · non-contrast
Comparison: None.

CLINICAL DATA: Suprapubic pain, microscopic hematuria fall

EXAM:
RENAL / URINARY TRACT ULTRASOUND COMPLETE

[Series 1: us renal · 59 acquisitions, 13 frames shown]
[im 1/59]
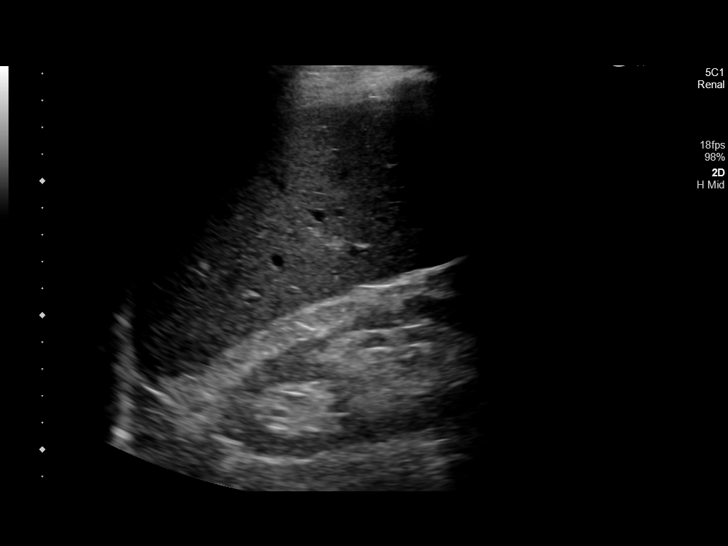
[im 5/59]
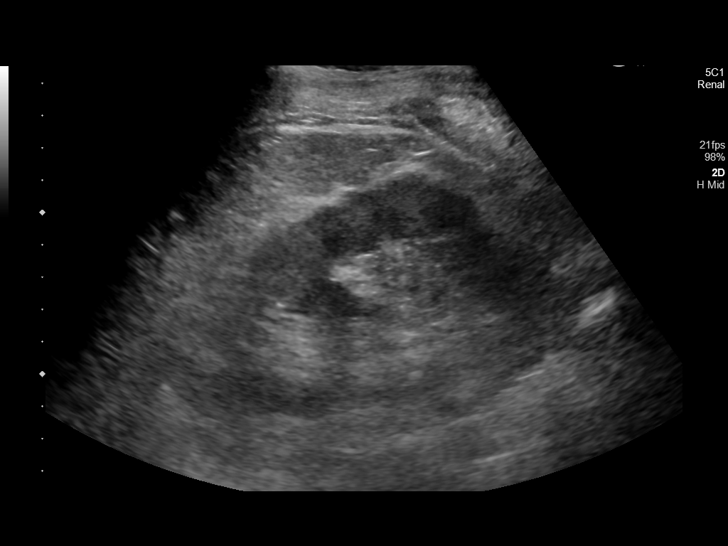
[im 10/59]
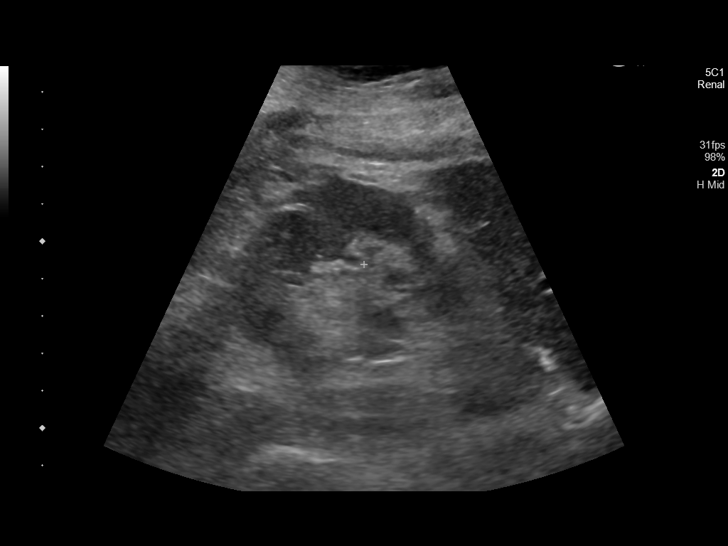
[im 15/59]
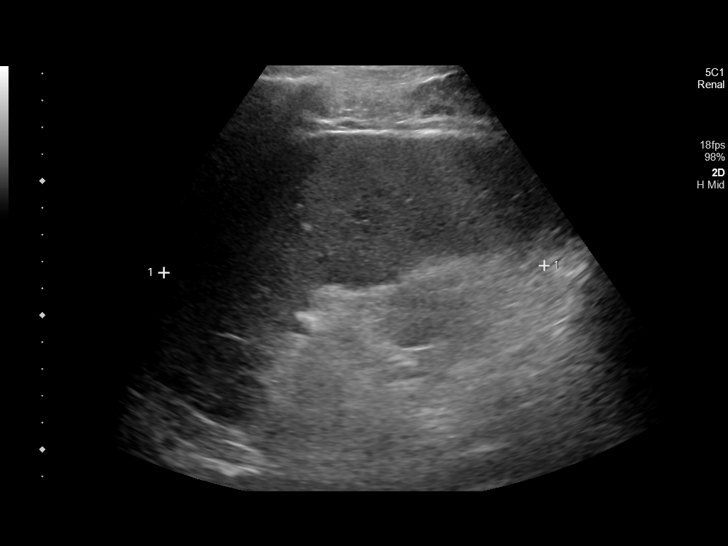
[im 20/59]
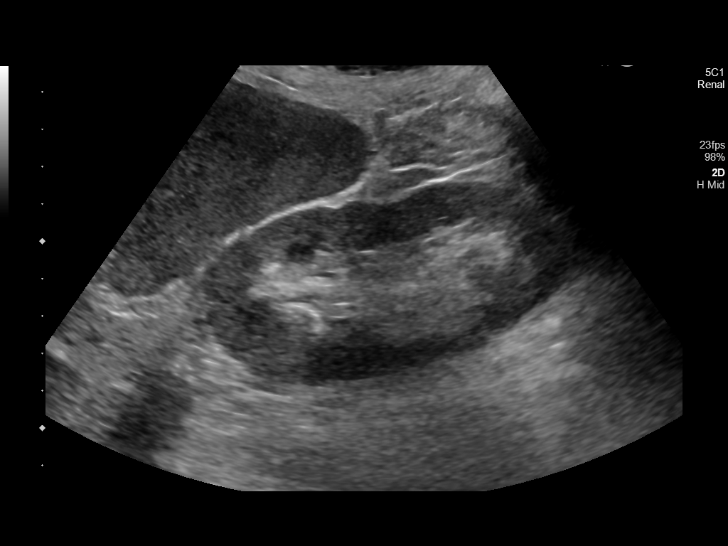
[im 25/59]
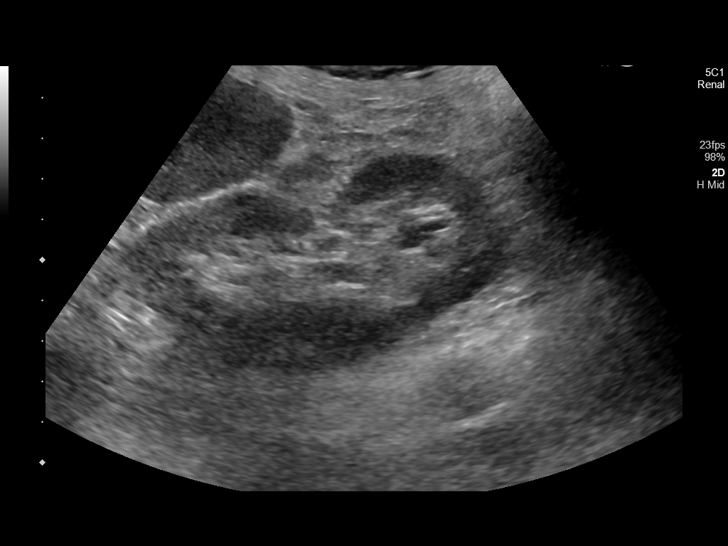
[im 30/59]
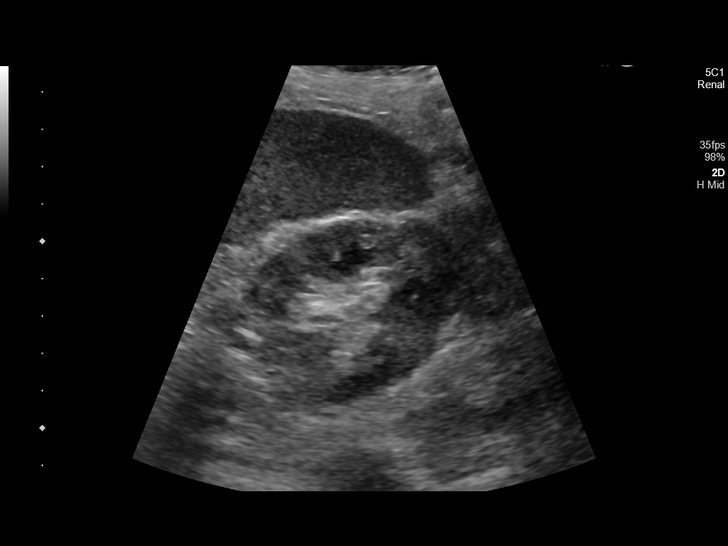
[im 34/59]
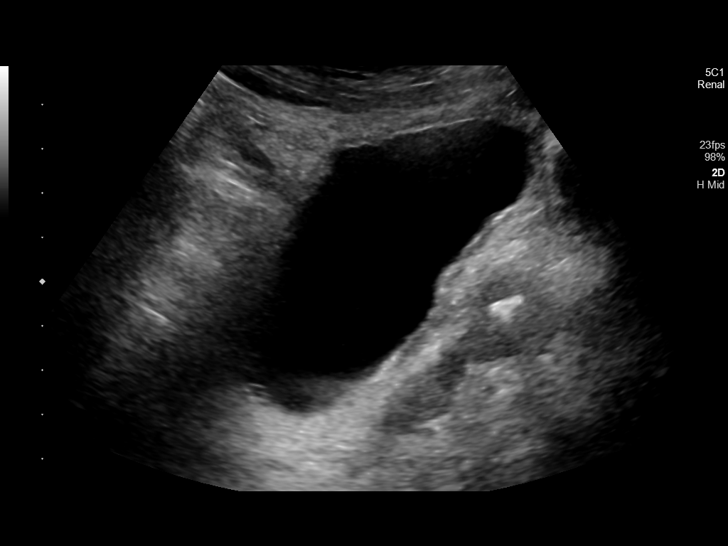
[im 39/59]
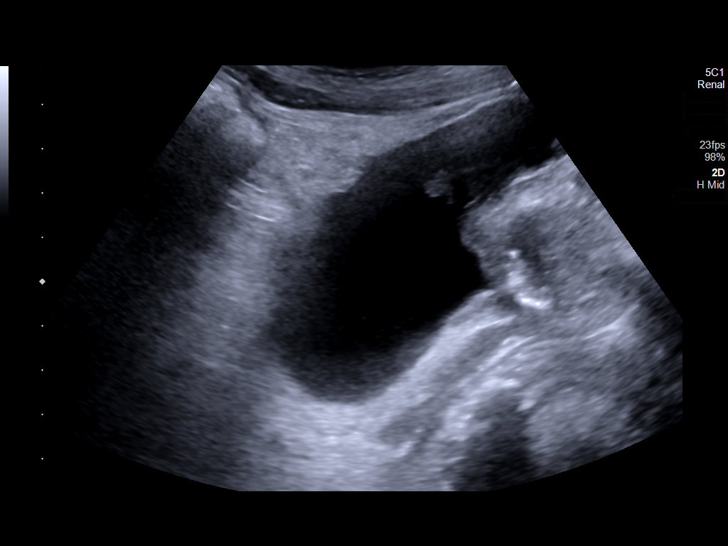
[im 44/59]
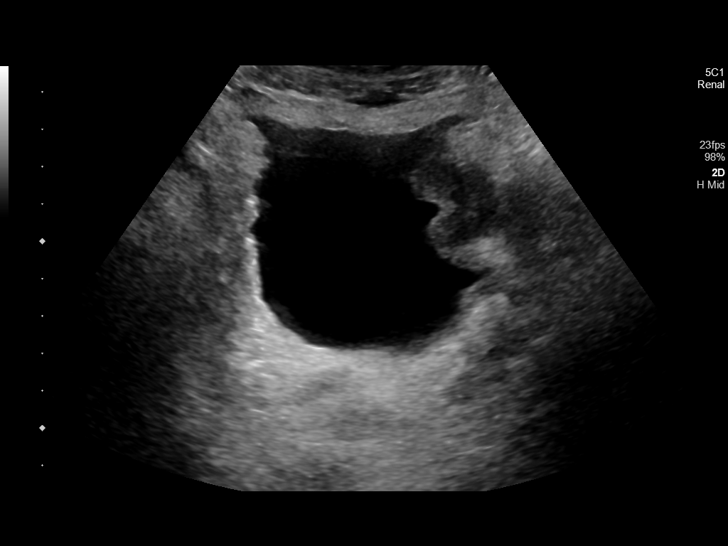
[im 49/59]
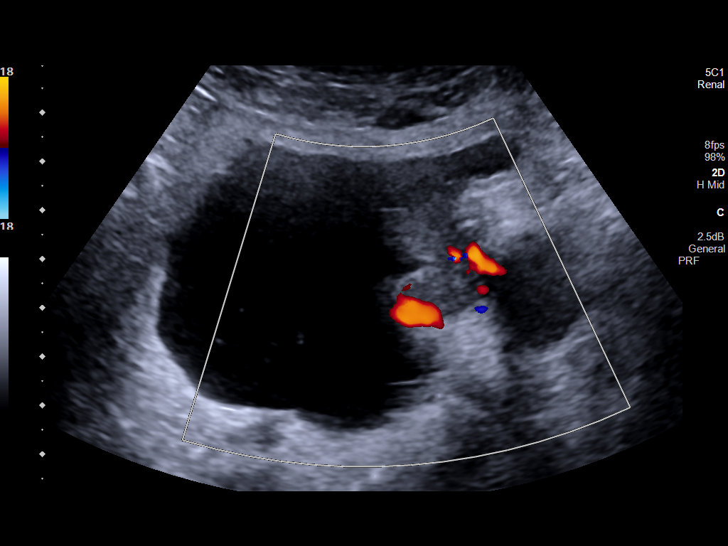
[im 54/59]
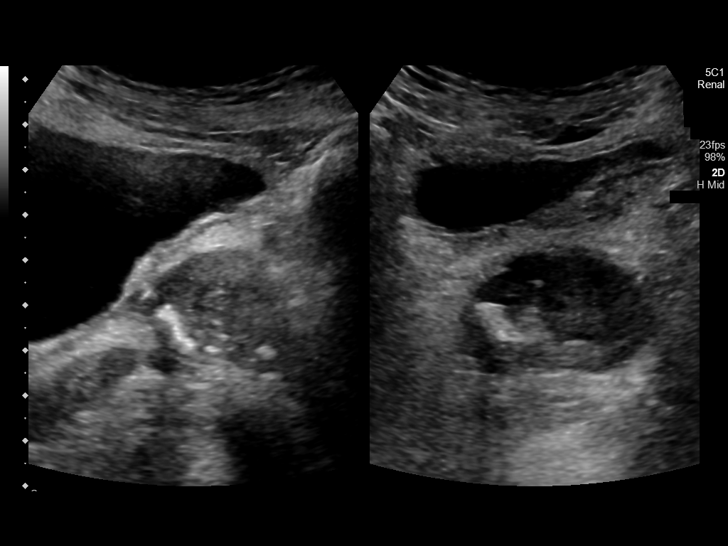
[im 59/59]
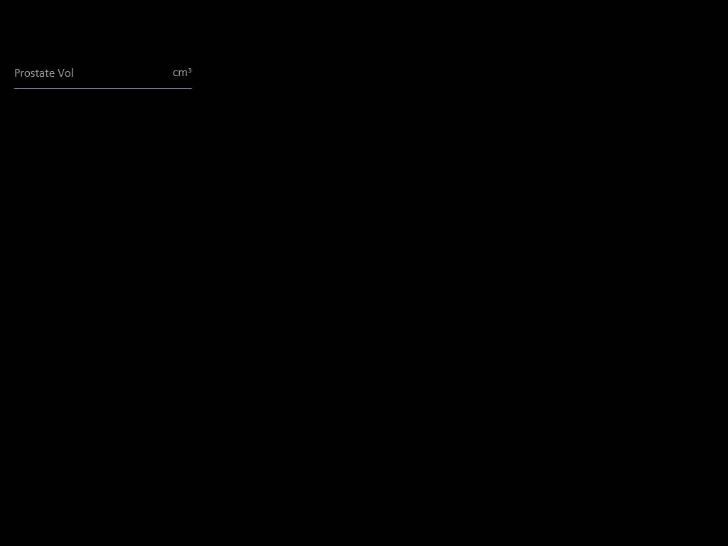

[13 of 25 positions shown; findings below may reference images not displayed]

FINDINGS: Right Kidney:

Renal measurements: 11.5 x 6.0 x 5.0 cm = volume: 182 mL.
Echogenicity within normal limits. No mass, shadowing stone, or
hydronephrosis visualized.

Left Kidney:

Renal measurements: 10.2 x 5.0 x 5.4 cm = volume: 143 mL.
Echogenicity within normal limits. No mass, shadowing stone, or
hydronephrosis visualized.

Bladder:

Lobulated heterogeneously hypoechoic mass along the left aspect of
the base of the bladder measuring approximately 3.6 x 2.7 x 3.1 cm
with internal vascularity. This mass closely approximates the
adjacent prostate gland.

Other:

Spleen measures 14.2 cm in length with a calculated volume of 694
mL.
IMPRESSION: 1. Heterogeneous soft tissue mass with internal vascularity located
within the urinary bladder measuring up to 3.6 cm, concerning for
bladder neoplasm. Urologic consultation and correlation with direct
visualization is recommended. Mass closely approximates the adjacent
prostate gland and could potentially represent soft tissue extension
from the prostate. Correlate with serum PSA.
2. No evidence of obstructive uropathy of either kidney.
3. Mildly enlarged spleen.

These results will be called to the ordering clinician or
representative by the Radiologist Assistant, and communication
documented in the PACS or [REDACTED].

## 2022-02-08 DIAGNOSIS — R42 Dizziness and giddiness: Secondary | ICD-10-CM | POA: Diagnosis not present

## 2022-02-08 DIAGNOSIS — Z87891 Personal history of nicotine dependence: Secondary | ICD-10-CM | POA: Diagnosis not present

## 2022-02-08 DIAGNOSIS — D45 Polycythemia vera: Secondary | ICD-10-CM | POA: Diagnosis not present

## 2022-02-08 DIAGNOSIS — I82401 Acute embolism and thrombosis of unspecified deep veins of right lower extremity: Secondary | ICD-10-CM | POA: Diagnosis not present

## 2022-02-08 DIAGNOSIS — I10 Essential (primary) hypertension: Secondary | ICD-10-CM | POA: Diagnosis not present

## 2022-02-08 DIAGNOSIS — Z7901 Long term (current) use of anticoagulants: Secondary | ICD-10-CM | POA: Diagnosis not present

## 2022-02-11 ENCOUNTER — Telehealth: Payer: Self-pay | Admitting: Family Medicine

## 2022-02-11 NOTE — Telephone Encounter (Signed)
Pt is calling to ask was he able to review the lab work from [facility]? If [facility]

## 2022-02-12 NOTE — Telephone Encounter (Signed)
Please advise 

## 2022-02-13 ENCOUNTER — Other Ambulatory Visit: Payer: Self-pay | Admitting: Family Medicine

## 2022-02-13 DIAGNOSIS — N1832 Chronic kidney disease, stage 3b: Secondary | ICD-10-CM

## 2022-02-13 NOTE — Telephone Encounter (Signed)
His thyroid functions are normal, continue same dose of levothyroxine.  His kidney functions are getting worse. He needs to drink more water. Needs referral to nephrology, have placed referral.

## 2022-02-15 NOTE — Telephone Encounter (Signed)
Patient was notified.

## 2022-02-18 DIAGNOSIS — H25811 Combined forms of age-related cataract, right eye: Secondary | ICD-10-CM | POA: Diagnosis not present

## 2022-02-25 ENCOUNTER — Telehealth: Payer: Self-pay | Admitting: Family Medicine

## 2022-02-25 NOTE — Telephone Encounter (Signed)
Patient dropped off form to be completed from dentist to have 2 teeth extractions.  Please complete and fax to # on form when completed.   Im sending it back today.

## 2022-02-26 ENCOUNTER — Telehealth: Payer: Self-pay

## 2022-02-26 DIAGNOSIS — I82411 Acute embolism and thrombosis of right femoral vein: Secondary | ICD-10-CM

## 2022-02-26 NOTE — Telephone Encounter (Signed)
I already filled out the form and sent to medical records, but did not discuss eliquis since the form did say anything about wanting to stop it. Can see if medical records still has the form and I can update it.

## 2022-02-26 NOTE — Telephone Encounter (Signed)
Copied from [place] [id number]. Topic: General - Other >> Feb 26, 2022  9:27 AM [place] C wrote: Reason for CRM: The patient has called for an update on their previously discussed concerns related to their ELIQUIS 5 MG TABS tablet [[id number]] prescription   The patient has been instructed by their dentist to discuss temporary cessation of the medication so they can have teeth extracted  The patient's dentist shares that information was submitted to the practice related to the patient and their medications  The patient has been told that their teeth cannot be extracted until the mentioned forms can be completed and submitted to their dentist  Please contact the patient further when possible

## 2022-02-26 NOTE — Telephone Encounter (Signed)
Form placed on Dr. Maralyn Sago desk.

## 2022-02-26 NOTE — Telephone Encounter (Signed)
Completed and sent to medical records to be faxed

## 2022-02-26 NOTE — Telephone Encounter (Signed)
Copied from [place] [id number]. Topic: General - Other >> Feb 26, 2022  9:27 AM Everette C wrote: Reason for CRM: The patient has called for an update on their previously discussed concerns related to their ELIQUIS 5 MG TABS tablet [[id number]] prescription   The patient has been instructed by their dentist to discuss temporary cessation of the medication so they can have teeth extracted  The patient's dentist shares that information was submitted to the practice related to the patient and their medications  The patient has been told that their teeth cannot be extracted until the mentioned forms can be completed and submitted to their dentist  Please contact the patient further when possible

## 2022-03-01 ENCOUNTER — Ambulatory Visit: Payer: Self-pay

## 2022-03-01 ENCOUNTER — Other Ambulatory Visit: Payer: Self-pay

## 2022-03-01 DIAGNOSIS — I82411 Acute embolism and thrombosis of right femoral vein: Secondary | ICD-10-CM

## 2022-03-01 DIAGNOSIS — M5410 Radiculopathy, site unspecified: Secondary | ICD-10-CM

## 2022-03-01 DIAGNOSIS — G589 Mononeuropathy, unspecified: Secondary | ICD-10-CM

## 2022-03-01 MED ORDER — OXYCODONE-ACETAMINOPHEN 10-325 MG PO TABS: 1.0000 | ORAL_TABLET | Freq: Four times a day (QID) | ORAL | 0 refills | Status: DC | PRN

## 2022-03-01 NOTE — Telephone Encounter (Signed)
Chief Complaint: Need teeth pulled and was told by dentist I need to stop Eliquis, so need to know if I can stop it  Symptoms: N/A Frequency: Stop prior to dental procedure Pertinent Negatives: Patient denies N/A Disposition: '[]'$ ED /'[]'$ Urgent Care (no appt availability in office) / '[]'$ Appointment(In office/virtual)/ '[]'$  [facility]/ '[]'$ Home Care/ '[]'$ Refused Recommended Disposition /'[]'$ [facility]/ '[x]'$  Follow-up with PCP Additional Notes: Patient says he has 2 teeth that will be pulled on the same day, but the dentist will not do anything while on Eliquis. He told patient to call his PCP. Patient says he also dropped a paper off at the front desk for Dr. Caryn Section to fill out and faxed back to the dentist, he's unsure if that was even done. He asked could he just stop it today, I advised it wouldn't be best to stop it and no dentist appointment is made because if he stopped it and the first appointment is 3-4 weeks out, then he's without Eliquis all that time. I advised I will send this to Dr. Caryn Section and someone will call back with his recommendation on when to stop it and how long after the dental procedure to restart it. Advised to wait until he hears back to call and schedule the dental appointment. Patient verbalized understanding.    Summary: eloquist ?   Patient is having dental procedure done and needs to know if he needs to stop taking Eloquist. Please call back     Reason for Disposition  [1] Caller has NON-URGENT medicine question about med that PCP prescribed AND [2] triager unable to answer question  Answer Assessment - Initial Assessment Questions 1. NAME of MEDICINE: "What medicine(s) are you calling about?"     Eliquis 2. QUESTION: "What is your question?" (e.g., double dose of medicine, side effect)     Need to know if I can come off for dental procedure, teeth pulling 3. PRESCRIBER: "Who prescribed the medicine?" Reason: if prescribed by specialist, call should be  referred to that group.     Dr. Caryn Section  Protocols used: Medication Question Call-A-AH

## 2022-03-01 NOTE — Telephone Encounter (Signed)
Pt is calling to check on the status of the form. And wanting Elquis '5mg'$ ?

## 2022-03-01 NOTE — Telephone Encounter (Signed)
The form was completed last week and sent to medical records to be faxed. He needs to stop the Eliquis 3 days before the dental procedure, and start back on it the day after the dental procedure.

## 2022-03-01 NOTE — Telephone Encounter (Signed)
Copied from [place] [id number]. Topic: General - Inquiry >> Mar 01, 2022  8:38 AM Erskine Squibb wrote: Reason for CRM: The patient called back one again to check on the status of the previously requested information and refills. He says he spoke with Arbie Cookey on Thursday about Eliquis and Oxycodone and that he needs his refill on the oxycodone by today. Please assist patient further

## 2022-03-02 MED ORDER — ELIQUIS 5 MG PO TABS: 5.0000 mg | ORAL_TABLET | Freq: Two times a day (BID) | ORAL | 3 refills | Status: DC

## 2022-03-02 NOTE — Telephone Encounter (Signed)
Please advise 

## 2022-03-02 NOTE — Telephone Encounter (Signed)
See other message were this was addressed.

## 2022-03-02 NOTE — Addendum Note (Signed)
Addended by: Birdie Sons on: 03/02/2022 09:08 AM   Modules accepted: Orders

## 2022-03-02 NOTE — Telephone Encounter (Signed)
Eliquis prescription has been sent to [facility]. You can check with medical records to see if form has been faxed. It was completed last week.

## 2022-03-10 DIAGNOSIS — H119 Unspecified disorder of conjunctiva: Secondary | ICD-10-CM | POA: Diagnosis not present

## 2022-03-17 DIAGNOSIS — H119 Unspecified disorder of conjunctiva: Secondary | ICD-10-CM | POA: Diagnosis not present

## 2022-03-29 ENCOUNTER — Other Ambulatory Visit: Payer: Self-pay | Admitting: Family Medicine

## 2022-03-29 DIAGNOSIS — M5410 Radiculopathy, site unspecified: Secondary | ICD-10-CM

## 2022-03-29 DIAGNOSIS — G589 Mononeuropathy, unspecified: Secondary | ICD-10-CM

## 2022-03-29 NOTE — Telephone Encounter (Signed)
Medication Refill - Medication: oxyCODONE-acetaminophen (PERCOCET) 10-325 MG tablet [[id number]]   Has the patient contacted their pharmacy? Yes.   (Agent: If no, request that the patient contact the pharmacy for the refill. If patient does not wish to contact the pharmacy document the reason why and proceed with request.) (Agent: If yes, when and what did the pharmacy advise?)  Preferred Pharmacy (with phone number or street name): [facility], [address] [place] [address] Phone: [phone]  Fax: [phone]  Has the patient been seen for an appointment in the last year OR does the patient have an upcoming appointment? Yes.    Agent: Please be advised that RX refills may take up to 3 business days. We ask that you follow-up with your pharmacy.

## 2022-03-30 MED ORDER — OXYCODONE-ACETAMINOPHEN 10-325 MG PO TABS: 1.0000 | ORAL_TABLET | Freq: Four times a day (QID) | ORAL | 0 refills | Status: DC | PRN

## 2022-03-30 NOTE — Telephone Encounter (Signed)
Requested medication (s) are due for refill today yes  Requested medication (s) are on the active medication list -yes  Future visit scheduled -no  Last refill: 03/01/22 #120  Notes to clinic: non delegated Rx  Requested Prescriptions  Pending Prescriptions Disp Refills   oxyCODONE-acetaminophen (PERCOCET) 10-325 MG tablet 120 tablet 0    Sig: Take 1 tablet by mouth every 6 (six) hours as needed for pain.     Not Delegated - Analgesics:  Opioid Agonist Combinations Failed - 03/29/2022  2:49 PM      Failed - This refill cannot be delegated      Failed - Urine Drug Screen completed in last 360 days      Passed - Valid encounter within last 3 months    Recent Outpatient Visits           1 month ago Hypothyroidism, unspecified type   [facility] Birdie Sons, MD   5 months ago Pilonidal cyst   [facility] Birdie Sons, MD   6 months ago Pilonidal cyst   [facility] Birdie Sons, MD   7 months ago Recurrent acute deep vein thrombosis (DVT) of right lower extremity [facility])   [facility] Birdie Sons, MD   8 months ago Hypothyroidism, unspecified type   [facility] Birdie Sons, MD                 Requested Prescriptions  Pending Prescriptions Disp Refills   oxyCODONE-acetaminophen (PERCOCET) 10-325 MG tablet 120 tablet 0    Sig: Take 1 tablet by mouth every 6 (six) hours as needed for pain.     Not Delegated - Analgesics:  Opioid Agonist Combinations Failed - 03/29/2022  2:49 PM      Failed - This refill cannot be delegated      Failed - Urine Drug Screen completed in last 360 days      Passed - Valid encounter within last 3 months    Recent Outpatient Visits           1 month ago Hypothyroidism, unspecified type   [facility] Birdie Sons, MD   5 months ago Pilonidal cyst   [facility] Birdie Sons, MD   6 months ago Pilonidal  cyst   [facility] Birdie Sons, MD   7 months ago Recurrent acute deep vein thrombosis (DVT) of right lower extremity [facility])   [facility] Birdie Sons, MD   8 months ago Hypothyroidism, unspecified type   [facility] Birdie Sons, MD

## 2022-03-30 NOTE — Telephone Encounter (Signed)
Pt called to see if Dr. Caryn Section will be calling in his Rx for Oxycodone today to [facility] / please advise

## 2022-04-15 DIAGNOSIS — I10 Essential (primary) hypertension: Secondary | ICD-10-CM | POA: Diagnosis not present

## 2022-04-15 DIAGNOSIS — Z7901 Long term (current) use of anticoagulants: Secondary | ICD-10-CM | POA: Diagnosis not present

## 2022-04-15 DIAGNOSIS — D45 Polycythemia vera: Secondary | ICD-10-CM | POA: Diagnosis not present

## 2022-04-15 DIAGNOSIS — Z936 Other artificial openings of urinary tract status: Secondary | ICD-10-CM | POA: Diagnosis not present

## 2022-04-15 DIAGNOSIS — Z87891 Personal history of nicotine dependence: Secondary | ICD-10-CM | POA: Diagnosis not present

## 2022-04-16 DIAGNOSIS — C678 Malignant neoplasm of overlapping sites of bladder: Secondary | ICD-10-CM | POA: Diagnosis not present

## 2022-04-16 DIAGNOSIS — R590 Localized enlarged lymph nodes: Secondary | ICD-10-CM | POA: Diagnosis not present

## 2022-04-16 DIAGNOSIS — K449 Diaphragmatic hernia without obstruction or gangrene: Secondary | ICD-10-CM | POA: Diagnosis not present

## 2022-04-16 DIAGNOSIS — R918 Other nonspecific abnormal finding of lung field: Secondary | ICD-10-CM | POA: Diagnosis not present

## 2022-04-19 DIAGNOSIS — N1832 Chronic kidney disease, stage 3b: Secondary | ICD-10-CM | POA: Diagnosis not present

## 2022-04-19 DIAGNOSIS — C678 Malignant neoplasm of overlapping sites of bladder: Secondary | ICD-10-CM | POA: Diagnosis not present

## 2022-04-19 DIAGNOSIS — D45 Polycythemia vera: Secondary | ICD-10-CM | POA: Diagnosis not present

## 2022-04-27 ENCOUNTER — Other Ambulatory Visit: Payer: Self-pay | Admitting: Family Medicine

## 2022-04-27 DIAGNOSIS — M5410 Radiculopathy, site unspecified: Secondary | ICD-10-CM

## 2022-04-27 DIAGNOSIS — Z936 Other artificial openings of urinary tract status: Secondary | ICD-10-CM | POA: Diagnosis not present

## 2022-04-27 DIAGNOSIS — G589 Mononeuropathy, unspecified: Secondary | ICD-10-CM

## 2022-04-27 NOTE — Telephone Encounter (Signed)
Requested medication (s) are due for refill today -yes  Requested medication (s) are on the active medication list -yes  Future visit scheduled -no  Last refill: 03/30/22 #120  Notes to clinic: non delegated Rx  Requested Prescriptions  Pending Prescriptions Disp Refills   oxyCODONE-acetaminophen (PERCOCET) 10-325 MG tablet 120 tablet 0    Sig: Take 1 tablet by mouth every 6 (six) hours as needed for pain.     Not Delegated - Analgesics:  Opioid Agonist Combinations Failed - 04/27/2022  2:06 PM      Failed - This refill cannot be delegated      Failed - Urine Drug Screen completed in last 360 days      Passed - Valid encounter within last 3 months    Recent Outpatient Visits           2 months ago Hypothyroidism, unspecified type   [facility] Birdie Sons, MD   6 months ago Pilonidal cyst   [facility] Birdie Sons, MD   7 months ago Pilonidal cyst   [facility] Birdie Sons, MD   8 months ago Recurrent acute deep vein thrombosis (DVT) of right lower extremity [facility])   [facility] Birdie Sons, MD   9 months ago Hypothyroidism, unspecified type   [facility] Birdie Sons, MD                 Requested Prescriptions  Pending Prescriptions Disp Refills   oxyCODONE-acetaminophen (PERCOCET) 10-325 MG tablet 120 tablet 0    Sig: Take 1 tablet by mouth every 6 (six) hours as needed for pain.     Not Delegated - Analgesics:  Opioid Agonist Combinations Failed - 04/27/2022  2:06 PM      Failed - This refill cannot be delegated      Failed - Urine Drug Screen completed in last 360 days      Passed - Valid encounter within last 3 months    Recent Outpatient Visits           2 months ago Hypothyroidism, unspecified type   [facility] Birdie Sons, MD   6 months ago Pilonidal cyst   [facility] Birdie Sons, MD   7 months ago Pilonidal  cyst   [facility] Birdie Sons, MD   8 months ago Recurrent acute deep vein thrombosis (DVT) of right lower extremity [facility])   [facility] Birdie Sons, MD   9 months ago Hypothyroidism, unspecified type   [facility] Birdie Sons, MD

## 2022-04-27 NOTE — Telephone Encounter (Signed)
Medication Refill - Medication: oxyCODONE-acetaminophen (PERCOCET) 10-325 MG tablet   Due on Thursday he says   Has the patient contacted their pharmacy? Yes.   (Agent: If no, request that the patient contact the pharmacy for the refill. If patient does not wish to contact the pharmacy document the reason why and proceed with request.) (Agent: If yes, when and what did the pharmacy advise?)  Preferred Pharmacy (with phone number or street name):  [facility], [address]  [place] [address]  Phone: [phone] Fax: [phone]   Has the patient been seen for an appointment in the last year OR does the patient have an upcoming appointment? Yes.    Agent: Please be advised that RX refills may take up to 3 business days. We ask that you follow-up with your pharmacy.

## 2022-04-28 MED ORDER — OXYCODONE-ACETAMINOPHEN 10-325 MG PO TABS: 1.0000 | ORAL_TABLET | Freq: Four times a day (QID) | ORAL | 0 refills | Status: DC | PRN

## 2022-05-06 DIAGNOSIS — H524 Presbyopia: Secondary | ICD-10-CM | POA: Diagnosis not present

## 2022-05-19 ENCOUNTER — Telehealth: Payer: Self-pay | Admitting: Family Medicine
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
## 2022-05-19 NOTE — Telephone Encounter (Signed)
I'll leave a months worth of samples for him at the front desk.

## 2022-05-20 NOTE — Telephone Encounter (Signed)
Patient advised.

## 2022-05-24 ENCOUNTER — Other Ambulatory Visit: Payer: Self-pay | Admitting: Family Medicine

## 2022-05-24 DIAGNOSIS — G589 Mononeuropathy, unspecified: Secondary | ICD-10-CM

## 2022-05-24 DIAGNOSIS — M5410 Radiculopathy, site unspecified: Secondary | ICD-10-CM

## 2022-05-24 NOTE — Telephone Encounter (Unsigned)
Copied from [place] [id number]. Topic: General - Other >> May 24, 2022  1:50 PM Everette C wrote: Reason for CRM: Medication Refill - Medication: oxyCODONE-acetaminophen (PERCOCET) 10-325 MG tablet [id number]  Has the patient contacted their pharmacy? Yes.   (Agent: If no, request that the patient contact the pharmacy for the refill. If patient does not wish to contact the pharmacy document the reason why and proceed with request.) (Agent: If yes, when and what did the pharmacy advise?)  Preferred Pharmacy (with phone number or street name): [facility], [place] [address] Phone: [phone] Fax: [phone] Hours: Not open 24 hours   Has the patient been seen for an appointment in the last year OR does the patient have an upcoming appointment? Yes.    Agent: Please be advised that RX refills may take up to 3 business days. We ask that you follow-up with your pharmacy.

## 2022-05-25 NOTE — Telephone Encounter (Signed)
Requested medications are due for refill today.  unsure  Requested medications are on the active medications list.  yes  Last refill. 04/28/2022 #120 0 rf  Future visit scheduled.   no  Notes to clinic.  Refill not delegated.    Requested Prescriptions  Pending Prescriptions Disp Refills   oxyCODONE-acetaminophen (PERCOCET) 10-325 MG tablet 120 tablet 0    Sig: Take 1 tablet by mouth every 6 (six) hours as needed for pain.     Not Delegated - Analgesics:  Opioid Agonist Combinations Failed - 05/24/2022  3:01 PM      Failed - This refill cannot be delegated      Failed - Urine Drug Screen completed in last 360 days      Failed - Valid encounter within last 3 months    Recent Outpatient Visits           3 months ago Hypothyroidism, unspecified type   [facility] Birdie Sons, MD   7 months ago Pilonidal cyst   [place], Donald E, MD   8 months ago Pilonidal cyst   [facility] Birdie Sons, MD   9 months ago Recurrent acute deep vein thrombosis (DVT) of right lower extremity [facility])   [place], Donald E, MD   10 months ago Hypothyroidism, unspecified type   [facility] Birdie Sons, MD

## 2022-05-26 NOTE — Telephone Encounter (Signed)
Patient call ed to inquire about medication refill for  Requested Prescriptions   Pending Prescriptions Disp Refills   oxyCODONE-acetaminophen (PERCOCET) 10-325 MG tablet 120 tablet 0    Sig: Take 1 tablet by mouth every 6 (six) hours as needed for pain.   Patient states he is out of medication and has contacted his pharmacy. Request has been routed to provider for approval, please advise patient when medication has been refilled.

## 2022-05-27 MED ORDER — OXYCODONE-ACETAMINOPHEN 10-325 MG PO TABS: 1.0000 | ORAL_TABLET | Freq: Four times a day (QID) | ORAL | 0 refills | Status: DC | PRN

## 2022-06-10 DIAGNOSIS — C679 Malignant neoplasm of bladder, unspecified: Secondary | ICD-10-CM | POA: Diagnosis not present

## 2022-06-10 DIAGNOSIS — D45 Polycythemia vera: Secondary | ICD-10-CM | POA: Diagnosis not present

## 2022-06-10 DIAGNOSIS — Z1589 Genetic susceptibility to other disease: Secondary | ICD-10-CM | POA: Diagnosis not present

## 2022-06-10 DIAGNOSIS — Z87891 Personal history of nicotine dependence: Secondary | ICD-10-CM | POA: Diagnosis not present

## 2022-06-10 DIAGNOSIS — Z7901 Long term (current) use of anticoagulants: Secondary | ICD-10-CM | POA: Diagnosis not present

## 2022-06-10 DIAGNOSIS — I82411 Acute embolism and thrombosis of right femoral vein: Secondary | ICD-10-CM | POA: Diagnosis not present

## 2022-06-10 DIAGNOSIS — D75839 Thrombocytosis, unspecified: Secondary | ICD-10-CM | POA: Diagnosis not present

## 2022-06-10 DIAGNOSIS — Z7982 Long term (current) use of aspirin: Secondary | ICD-10-CM | POA: Diagnosis not present

## 2022-06-10 DIAGNOSIS — D72829 Elevated white blood cell count, unspecified: Secondary | ICD-10-CM | POA: Diagnosis not present

## 2022-06-10 DIAGNOSIS — M79672 Pain in left foot: Secondary | ICD-10-CM | POA: Diagnosis not present

## 2022-06-15 ENCOUNTER — Telehealth: Payer: Self-pay | Admitting: Family Medicine

## 2022-06-15 DIAGNOSIS — D72829 Elevated white blood cell count, unspecified: Secondary | ICD-10-CM | POA: Diagnosis not present

## 2022-06-15 DIAGNOSIS — B002 Herpesviral gingivostomatitis and pharyngotonsillitis: Secondary | ICD-10-CM | POA: Diagnosis not present

## 2022-06-15 DIAGNOSIS — R209 Unspecified disturbances of skin sensation: Secondary | ICD-10-CM | POA: Diagnosis not present

## 2022-06-15 DIAGNOSIS — D471 Chronic myeloproliferative disease: Secondary | ICD-10-CM | POA: Diagnosis not present

## 2022-06-15 DIAGNOSIS — I82411 Acute embolism and thrombosis of right femoral vein: Secondary | ICD-10-CM | POA: Diagnosis not present

## 2022-06-15 DIAGNOSIS — M79672 Pain in left foot: Secondary | ICD-10-CM | POA: Diagnosis not present

## 2022-06-15 DIAGNOSIS — D509 Iron deficiency anemia, unspecified: Secondary | ICD-10-CM | POA: Diagnosis not present

## 2022-06-15 DIAGNOSIS — Z5902 Unsheltered homelessness: Secondary | ICD-10-CM | POA: Diagnosis not present

## 2022-06-15 DIAGNOSIS — E039 Hypothyroidism, unspecified: Secondary | ICD-10-CM | POA: Diagnosis not present

## 2022-06-15 DIAGNOSIS — I129 Hypertensive chronic kidney disease with stage 1 through stage 4 chronic kidney disease, or unspecified chronic kidney disease: Secondary | ICD-10-CM | POA: Diagnosis not present

## 2022-06-15 DIAGNOSIS — N1832 Chronic kidney disease, stage 3b: Secondary | ICD-10-CM | POA: Diagnosis not present

## 2022-06-15 DIAGNOSIS — I70203 Unspecified atherosclerosis of native arteries of extremities, bilateral legs: Secondary | ICD-10-CM | POA: Diagnosis not present

## 2022-06-15 DIAGNOSIS — M79605 Pain in left leg: Secondary | ICD-10-CM | POA: Diagnosis not present

## 2022-06-15 DIAGNOSIS — I70291 Other atherosclerosis of native arteries of extremities, right leg: Secondary | ICD-10-CM | POA: Diagnosis not present

## 2022-06-15 DIAGNOSIS — I70222 Atherosclerosis of native arteries of extremities with rest pain, left leg: Secondary | ICD-10-CM | POA: Diagnosis not present

## 2022-06-15 DIAGNOSIS — H11419 Vascular abnormalities of conjunctiva, unspecified eye: Secondary | ICD-10-CM | POA: Diagnosis not present

## 2022-06-15 DIAGNOSIS — I739 Peripheral vascular disease, unspecified: Secondary | ICD-10-CM | POA: Diagnosis not present

## 2022-06-15 NOTE — Telephone Encounter (Signed)
Jerry Mcpherson to schedule their annual wellness visit. Appointment made for 08/17/2022.  [place] Direct Dial: [phone]

## 2022-06-16 HISTORY — PX: FEMORAL ARTERY STENT: SHX1583

## 2022-06-21 ENCOUNTER — Telehealth: Payer: Self-pay | Admitting: *Deleted

## 2022-06-21 NOTE — Transitions of Care (Post Inpatient/ED Visit) (Signed)
   06/21/2022  Name: Jerry Mcpherson MRN: [id number] DOB: 11/27/1940  Today's TOC FU Call Status: Today's TOC FU Call Status:: Successful TOC FU Call Competed TOC FU Call Complete Date: 06/21/22  Transition Care Management Follow-up Telephone Call Date of Discharge: 06/19/22 Discharge Facility: Other ([facility]) Name of Other (Non[facility]) Discharge Facility: [facility] Type of Discharge: Inpatient Admission Primary Inpatient Discharge Diagnosis:: Peripheral vascular disease How have you been since you were released from the hospital?: Better Any questions or concerns?: Yes Patient Questions/Concerns:: Patient stated he couldn't see the bottom of his foot. RN explained to patient to get a mirroe lay on floor and he would be able to see it Patient Questions/Concerns Addressed: Other: (concerns addressed)  Items Reviewed: Did you receive and understand the discharge instructions provided?: Yes Medications obtained and verified?: Yes (Medications Reviewed) (patient didn't know what clopidpgrel was for. RN explained) Any new allergies since your discharge?: No Dietary orders reviewed?: No Do you have support at home?: Yes People in Home: other relative(s) (nephew) Name of Support/Comfort Primary Source: [name] and Equipment/Supplies: [place] Ordered?: No Any new equipment or medical supplies ordered?: No  Functional Questionnaire: Do you need assistance with bathing/showering or dressing?: No Do you need assistance with meal preparation?: No Do you need assistance with eating?: No Do you have difficulty maintaining continence: No Do you need assistance with getting out of bed/getting out of a chair/moving?: No Do you have difficulty managing or taking your medications?: No  Folllow up appointments reviewed: PCP Follow-up appointment confirmed?: Yes Date of PCP follow-up appointment?: 07/05/22 Follow-up Provider: Dr Caryn Section [id number] 4:00 Specialist  Hospital Follow-up appointment confirmed?: Yes Date of Specialist follow-up appointment?: 07/13/22 Follow-Up Specialty Provider:: [id number] nephrology 1:20, [id number] Vascular surgery 1:30 Do you need transportation to your follow-up appointment?: No Do you understand care options if your condition(s) worsen?: Yes-patient verbalized understanding  SDOH Interventions Today    Flowsheet Row Most Recent Value  SDOH Interventions   Food Insecurity Interventions Intervention Not Indicated  Transportation Interventions Intervention Not Indicated      Interventions Today    Flowsheet Row Most Recent Value  General Interventions   General Interventions Discussed/Reviewed Doctor Visits, General Interventions Reviewed, General Interventions Discussed  Doctor Visits Discussed/Reviewed Doctor Visits Discussed  Education Interventions   Education Provided Provided Education  Provided Verbal Education On Foot Care  [RN explained to patient that he could put a mirror on the floor and see bottom of foot. That way he can monitor for color changes]  Pharmacy Interventions   Pharmacy Dicussed/Reviewed Pharmacy Topics Reviewed, Pharmacy Topics Discussed  [Ptient wanted to kow what clopidgrel was for. RN explained to patient the usage.]      TOC Interventions Today    Flowsheet Row Most Recent Value  TOC Interventions   TOC Interventions Discussed/Reviewed TOC Interventions Discussed, TOC Interventions Reviewed       [place] [facility] [phone]

## 2022-06-22 ENCOUNTER — Other Ambulatory Visit: Payer: Self-pay | Admitting: Family Medicine

## 2022-06-22 DIAGNOSIS — M5410 Radiculopathy, site unspecified: Secondary | ICD-10-CM

## 2022-06-22 DIAGNOSIS — G589 Mononeuropathy, unspecified: Secondary | ICD-10-CM

## 2022-06-22 NOTE — Telephone Encounter (Signed)
Medication Refill - Medication:  oxyCODONE-acetaminophen (PERCOCET) 10-325 MG tablet [id number]   Has the patient contacted their pharmacy? Yes.    (Agent: If yes, when and what did the pharmacy advise?) Contact Provider   Preferred Pharmacy (with phone number or street name): [facility], [place].   Has the patient been seen for an appointment in the last year OR does the patient have an upcoming appointment? Yes.    Agent: Please be advised that RX refills may take up to 3 business days. We ask that you follow-up with your pharmacy.

## 2022-06-23 MED ORDER — OXYCODONE-ACETAMINOPHEN 10-325 MG PO TABS: 1.0000 | ORAL_TABLET | Freq: Four times a day (QID) | ORAL | 0 refills | Status: DC | PRN

## 2022-06-23 NOTE — Telephone Encounter (Signed)
Pt is calling back to check on his medication refill request. Explained to pt it takes 48-72hrs for a refill request. Pt states that he will be out of his medication today, he is taking his last pill and is requesting another message to be sent to PCP.

## 2022-06-23 NOTE — Telephone Encounter (Signed)
Requested medication (s) are due for refill today: yes  Requested medication (s) are on the active medication list: yes  Last refill:  05/27/22  Future visit scheduled: yes  Notes to clinic:  Unable to refill per protocol, cannot delegate.      Requested Prescriptions  Pending Prescriptions Disp Refills   oxyCODONE-acetaminophen (PERCOCET) 10-325 MG tablet 120 tablet 0    Sig: Take 1 tablet by mouth every 6 (six) hours as needed for pain.     Not Delegated - Analgesics:  Opioid Agonist Combinations Failed - 06/22/2022  4:32 PM      Failed - This refill cannot be delegated      Failed - Urine Drug Screen completed in last 360 days      Failed - Valid encounter within last 3 months    Recent Outpatient Visits           4 months ago Hypothyroidism, unspecified type   [facility] Birdie Sons, MD   8 months ago Pilonidal cyst   [facility] Birdie Sons, MD   9 months ago Pilonidal cyst   [facility] Birdie Sons, MD   10 months ago Recurrent acute deep vein thrombosis (DVT) of right lower extremity [facility])   [place], Donald E, MD   11 months ago Hypothyroidism, unspecified type   [place], MD       Future Appointments             In 1 week Fisher, [name], MD [facility] [place]

## 2022-06-28 ENCOUNTER — Inpatient Hospital Stay: Payer: Medicare PPO | Admitting: Family Medicine

## 2022-07-01 ENCOUNTER — Other Ambulatory Visit: Payer: Self-pay | Admitting: Family Medicine

## 2022-07-01 DIAGNOSIS — A6000 Herpesviral infection of urogenital system, unspecified: Secondary | ICD-10-CM

## 2022-07-05 ENCOUNTER — Inpatient Hospital Stay: Payer: Medicare PPO | Admitting: Family Medicine

## 2022-07-09 ENCOUNTER — Encounter: Payer: Self-pay | Admitting: Family Medicine

## 2022-07-09 ENCOUNTER — Ambulatory Visit (INDEPENDENT_AMBULATORY_CARE_PROVIDER_SITE_OTHER): Payer: Medicare PPO | Admitting: Family Medicine

## 2022-07-09 VITALS — BP 120/86 | HR 105 | Wt 155.0 lb

## 2022-07-09 DIAGNOSIS — I739 Peripheral vascular disease, unspecified: Secondary | ICD-10-CM | POA: Diagnosis not present

## 2022-07-09 DIAGNOSIS — L03032 Cellulitis of left toe: Secondary | ICD-10-CM | POA: Diagnosis not present

## 2022-07-09 MED ORDER — AMOXICILLIN-POT CLAVULANATE 875-125 MG PO TABS: 1.0000 | ORAL_TABLET | Freq: Two times a day (BID) | ORAL | 0 refills | Status: DC

## 2022-07-09 NOTE — Progress Notes (Signed)
I,[name],acting as a Education administrator for Lelon Huh, MD.,have documented all relevant documentation on the behalf of Lelon Huh, MD,as directed by  Lelon Huh, MD while in the presence of Lelon Huh, MD.   Established patient visit   Patient: Jerry Mcpherson   DOB: May 01, 1940   82 y.o. Male  MRN: [id number] Visit Date: 07/09/2022  Today's healthcare provider: Mikey Kirschner, PA-C   No chief complaint on file.  Subjective    HPI  Patient presents with long toe pain in on left foot associated with edema. Pain radiates to great toe. Patient reports last night when foot was in pain he took off his shoe and noticed discharge/fluids coming from long toe as if it was infected. Reports sensitive to the touch to the point he can't even put on socks. Reports pain started around the same time that he had his stent placed and has been on and off since then but was unbearable last night. States he has previously visited the ER twice  Medications: Outpatient Medications Prior to Visit  Medication Sig   amLODipine (NORVASC) 2.5 MG tablet TAKE ONE (1) TABLET BY MOUTH ONCE DAILY   Ascorbic Acid (VITAMIN C) 1000 MG tablet Take 2 tablets by mouth daily.   aspirin 81 MG tablet Take 2 tablets daily   budesonide-formoterol (SYMBICORT) 160-4.5 MCG/ACT inhaler Inhale 2 puffs into the lungs 2 (two) times daily.   clopidogrel (PLAVIX) 75 MG tablet Take 75 mg by mouth daily.   cyproheptadine (PERIACTIN) 4 MG tablet TAKE 1/2-1 TABLET BY MOUTH THREE TIMES DAILY AS NEEDED TO HELP APPETITE   diclofenac Sodium (VOLTAREN) 1 % GEL Apply 4 g topically 4 (four) times daily as needed.   docusate sodium (COLACE) 100 MG capsule Take 1 capsule by mouth daily as needed.   ELIQUIS 5 MG TABS tablet Take 1 tablet (5 mg total) by mouth 2 (two) times daily.   fluticasone (FLONASE) 50 MCG/ACT nasal spray Place 2 sprays into both nostrils daily.   hydrochlorothiazide (HYDRODIURIL) 25 MG tablet Take 1 tablet (25  mg total) by mouth daily.   hydroxyurea (HYDREA) 500 MG capsule Take 500 mg by mouth as directed. 2 tablets 3 days a week, and 1 tablet all other days   levothyroxine (SYNTHROID) 75 MCG tablet TAKE 1 TABLET BY MOUTH EVERY DAY   metoprolol succinate (TOPROL-XL) 50 MG 24 hr tablet Take 1 tablet (50 mg total) by mouth daily.   Multiple Vitamins-Minerals (MULTIVITAMIN ADULT PO) Take 1 tablet by mouth daily.   mupirocin cream (BACTROBAN) 2 % Apply 1 application  topically 2 (two) times daily.   naproxen (NAPROSYN) 500 MG tablet TAKE 1 TABLET(500 MG) BY MOUTH TWICE DAILY WITH A MEAL   oxyCODONE-acetaminophen (PERCOCET) 10-325 MG tablet Take 1 tablet by mouth every 6 (six) hours as needed for pain.   potassium chloride (KLOR-CON) 10 MEQ tablet Take by mouth.   promethazine (PHENERGAN) 12.5 MG tablet TAKE 1 TO 2 TABLETS(12.5 TO 25 MG) BY MOUTH EVERY 8 HOURS AS NEEDED FOR NAUSEA OR VOMITING   sildenafil (VIAGRA) 50 MG tablet Take 0.5-1 tablets by mouth daily as needed.   valACYclovir (VALTREX) 1000 MG tablet TAKE 1 TABLET BY MOUTH TWICE A DAY AS NEEDED   No facility-administered medications prior to visit.       Objective    BP 120/86 (BP Location: Left Arm, Patient Position: Sitting, Cuff Size: Normal)   Pulse (!) 105   Wt 155 lb (70.3 kg)  SpO2 98%   BMI 23.57 kg/m    Physical Exam     Assessment & Plan     1. Cellulitis of toe of left foot  - amoxicillin-clavulanate (AUGMENTIN) 875-125 MG tablet; Take 1 tablet by mouth 2 (two) times daily for 10 days.  Dispense: 20 tablet; Refill: 0  Epsom salt water soaks. Recheck in 3 days.   2. PAD (peripheral artery disease) (HCC) 3 weeks post SFA stenting at [facility]      The entirety of the information documented in the History of Present Illness, Review of Systems and Physical Exam were personally obtained by me. Portions of this information were initially documented by the CMA and reviewed by me for thoroughness and accuracy.     Mikey Kirschner, PA-C  [place] [phone] (phone) [phone] (fax)  [place]

## 2022-07-12 ENCOUNTER — Ambulatory Visit (INDEPENDENT_AMBULATORY_CARE_PROVIDER_SITE_OTHER): Payer: Medicare PPO | Admitting: Family Medicine

## 2022-07-12 VITALS — BP 106/74 | HR 93 | Temp 97.7°F | Wt 154.0 lb

## 2022-07-12 DIAGNOSIS — I739 Peripheral vascular disease, unspecified: Secondary | ICD-10-CM

## 2022-07-12 DIAGNOSIS — L03032 Cellulitis of left toe: Secondary | ICD-10-CM | POA: Diagnosis not present

## 2022-07-12 NOTE — Progress Notes (Signed)
[place]ina P[place],acting as a scribe for Lelon Huh, MD.,have documented all relevant documentation on the behalf of Lelon Huh, MD,as directed by  Lelon Huh, MD while in the presence of Lelon Huh, MD.     Established patient visit   Patient: Jerry Mcpherson   DOB: 09/06/1940   82 y.o. Male  MRN: [id number] Visit Date: 07/12/2022  Today's healthcare provider: Lelon Huh, MD    Subjective    HPI  Cellulitis- Patient was seen 3 days ago for cellulitis of the left second toe and prescribed 10 day course of Augmentin. Patient states he has not been soaking the toe as instructed.  When asked if it was still draining he states he does not know.  Medications: Outpatient Medications Prior to Visit  Medication Sig   amLODipine (NORVASC) 2.5 MG tablet TAKE ONE (1) TABLET BY MOUTH ONCE DAILY   amoxicillin-clavulanate (AUGMENTIN) 875-125 MG tablet Take 1 tablet by mouth 2 (two) times daily for 10 days.   Ascorbic Acid (VITAMIN C) 1000 MG tablet Take 2 tablets by mouth daily.   aspirin 81 MG tablet Take 2 tablets daily   budesonide-formoterol (SYMBICORT) 160-4.5 MCG/ACT inhaler Inhale 2 puffs into the lungs 2 (two) times daily.   clopidogrel (PLAVIX) 75 MG tablet Take 75 mg by mouth daily.   cyproheptadine (PERIACTIN) 4 MG tablet TAKE 1/2-1 TABLET BY MOUTH THREE TIMES DAILY AS NEEDED TO HELP APPETITE   diclofenac Sodium (VOLTAREN) 1 % GEL Apply 4 g topically 4 (four) times daily as needed.   docusate sodium (COLACE) 100 MG capsule Take 1 capsule by mouth daily as needed.   ELIQUIS 5 MG TABS tablet Take 1 tablet (5 mg total) by mouth 2 (two) times daily.   fluticasone (FLONASE) 50 MCG/ACT nasal spray Place 2 sprays into both nostrils daily.   hydrochlorothiazide (HYDRODIURIL) 25 MG tablet Take 1 tablet (25 mg total) by mouth daily.   hydroxyurea (HYDREA) 500 MG capsule Take 500 mg by mouth as directed. 2 tablets 3 days a week, and 1 tablet all other days   levothyroxine  (SYNTHROID) 75 MCG tablet TAKE 1 TABLET BY MOUTH EVERY DAY   metoprolol succinate (TOPROL-XL) 50 MG 24 hr tablet Take 1 tablet (50 mg total) by mouth daily.   Multiple Vitamins-Minerals (MULTIVITAMIN ADULT PO) Take 1 tablet by mouth daily.   mupirocin cream (BACTROBAN) 2 % Apply 1 application  topically 2 (two) times daily.   naproxen (NAPROSYN) 500 MG tablet TAKE 1 TABLET(500 MG) BY MOUTH TWICE DAILY WITH A MEAL   oxyCODONE-acetaminophen (PERCOCET) 10-325 MG tablet Take 1 tablet by mouth every 6 (six) hours as needed for pain.   potassium chloride (KLOR-CON) 10 MEQ tablet Take by mouth.   promethazine (PHENERGAN) 12.5 MG tablet TAKE 1 TO 2 TABLETS(12.5 TO 25 MG) BY MOUTH EVERY 8 HOURS AS NEEDED FOR NAUSEA OR VOMITING   sildenafil (VIAGRA) 50 MG tablet Take 0.5-1 tablets by mouth daily as needed.   valACYclovir (VALTREX) 1000 MG tablet TAKE 1 TABLET BY MOUTH TWICE A DAY AS NEEDED   No facility-administered medications prior to visit.    Review of Systems  Skin:  Positive for color change and wound.       Objective    BP 106/74 (BP Location: Left Arm, Patient Position: Sitting, Cuff Size: Normal)   Pulse 93   Temp 97.7 F (36.5 C) (Oral)   Wt 154 lb (69.9 kg)   SpO2 98%   BMI 23.42 kg/m  Physical Exam     Assessment & Plan     1. Cellulitis of toe of left foot Slight improvement since starting Augmentin 3 days ago. Has 7 more days, will extend for an additional 7 days. He has follow up scheduled with neurology tomorrow and vascular surgery on 07-20-2022.   2. PAD (peripheral artery disease) Otherwise doing well s/w recent femoral stenting.       The entirety of the information documented in the History of Present Illness, Review of Systems and Physical Exam were personally obtained by me. Portions of this information were initially documented by the CMA and reviewed by me for thoroughness and accuracy.     Lelon Huh, MD  [place] [phone] (phone) [phone] (fax)  [place]

## 2022-07-13 DIAGNOSIS — Z8551 Personal history of malignant neoplasm of bladder: Secondary | ICD-10-CM | POA: Diagnosis not present

## 2022-07-13 DIAGNOSIS — Z86718 Personal history of other venous thrombosis and embolism: Secondary | ICD-10-CM | POA: Diagnosis not present

## 2022-07-13 DIAGNOSIS — Z1159 Encounter for screening for other viral diseases: Secondary | ICD-10-CM | POA: Diagnosis not present

## 2022-07-13 DIAGNOSIS — N183 Chronic kidney disease, stage 3 unspecified: Secondary | ICD-10-CM | POA: Diagnosis not present

## 2022-07-13 DIAGNOSIS — Z7901 Long term (current) use of anticoagulants: Secondary | ICD-10-CM | POA: Diagnosis not present

## 2022-07-15 DIAGNOSIS — M25572 Pain in left ankle and joints of left foot: Secondary | ICD-10-CM | POA: Diagnosis not present

## 2022-07-15 DIAGNOSIS — L819 Disorder of pigmentation, unspecified: Secondary | ICD-10-CM | POA: Diagnosis not present

## 2022-07-15 DIAGNOSIS — I739 Peripheral vascular disease, unspecified: Secondary | ICD-10-CM | POA: Diagnosis not present

## 2022-07-15 DIAGNOSIS — L97521 Non-pressure chronic ulcer of other part of left foot limited to breakdown of skin: Secondary | ICD-10-CM | POA: Diagnosis not present

## 2022-07-19 DIAGNOSIS — Z959 Presence of cardiac and vascular implant and graft, unspecified: Secondary | ICD-10-CM | POA: Diagnosis not present

## 2022-07-19 DIAGNOSIS — I739 Peripheral vascular disease, unspecified: Secondary | ICD-10-CM | POA: Diagnosis not present

## 2022-07-21 ENCOUNTER — Other Ambulatory Visit: Payer: Self-pay | Admitting: Family Medicine

## 2022-07-21 DIAGNOSIS — M5410 Radiculopathy, site unspecified: Secondary | ICD-10-CM

## 2022-07-21 DIAGNOSIS — G589 Mononeuropathy, unspecified: Secondary | ICD-10-CM

## 2022-07-21 NOTE — Telephone Encounter (Signed)
Medication Refill - Medication: oxyCODONE-acetaminophen (PERCOCET) 10-325 MG tablet  Has the patient contacted their pharmacy? No.  Preferred Pharmacy (with phone number or street name):  [facility] - [place], [place] - [address] Phone:  [phone]  Fax:  [phone]     Has the patient been seen for an appointment in the last year OR does the patient have an upcoming appointment? Yes.    Agent: Please be advised that RX refills may take up to 3 business days. We ask that you follow-up with your pharmacy.  

## 2022-07-22 MED ORDER — OXYCODONE-ACETAMINOPHEN 10-325 MG PO TABS
1.0000 | ORAL_TABLET | Freq: Four times a day (QID) | ORAL | 0 refills | Status: DC | PRN
Start: 2022-07-22 — End: 2022-08-19

## 2022-07-22 NOTE — Telephone Encounter (Signed)
Requested medication (s) are due for refill today: yes  Requested medication (s) are on the active medication list: yes  Last refill:  06/23/22  Future visit scheduled: yes  Notes to clinic:  Unable to refill per protocol, cannot delegate.      Requested Prescriptions  Pending Prescriptions Disp Refills   oxyCODONE-acetaminophen (PERCOCET) 10-325 MG tablet 120 tablet 0    Sig: Take 1 tablet by mouth every 6 (six) hours as needed for pain.     Not Delegated - Analgesics:  Opioid Agonist Combinations Failed - 07/21/2022 12:37 PM      Failed - This refill cannot be delegated      Failed - Urine Drug Screen completed in last 360 days      Passed - Valid encounter within last 3 months    Recent Outpatient Visits           1 week ago Cellulitis of toe of left foot   [facility] Malva Limes, MD   1 week ago Cellulitis of toe of left foot   [facility] Malva Limes, MD   5 months ago Hypothyroidism, unspecified type   [facility] Malva Limes, MD   9 months ago Pilonidal cyst   [facility] Malva Limes, MD   10 months ago Pilonidal cyst   [facility] Malva Limes, MD

## 2022-07-30 ENCOUNTER — Other Ambulatory Visit: Payer: Self-pay | Admitting: Family Medicine

## 2022-07-30 DIAGNOSIS — L03032 Cellulitis of left toe: Secondary | ICD-10-CM

## 2022-08-04 DIAGNOSIS — Z452 Encounter for adjustment and management of vascular access device: Secondary | ICD-10-CM | POA: Diagnosis not present

## 2022-08-04 DIAGNOSIS — C678 Malignant neoplasm of overlapping sites of bladder: Secondary | ICD-10-CM | POA: Diagnosis not present

## 2022-08-04 DIAGNOSIS — N1832 Chronic kidney disease, stage 3b: Secondary | ICD-10-CM | POA: Diagnosis not present

## 2022-08-04 DIAGNOSIS — R000 Tachycardia, unspecified: Secondary | ICD-10-CM | POA: Diagnosis not present

## 2022-08-04 DIAGNOSIS — Z79899 Other long term (current) drug therapy: Secondary | ICD-10-CM | POA: Diagnosis not present

## 2022-08-04 DIAGNOSIS — I350 Nonrheumatic aortic (valve) stenosis: Secondary | ICD-10-CM | POA: Diagnosis not present

## 2022-08-06 DIAGNOSIS — D471 Chronic myeloproliferative disease: Secondary | ICD-10-CM | POA: Diagnosis not present

## 2022-08-06 DIAGNOSIS — D751 Secondary polycythemia: Secondary | ICD-10-CM | POA: Diagnosis not present

## 2022-08-06 DIAGNOSIS — R739 Hyperglycemia, unspecified: Secondary | ICD-10-CM | POA: Diagnosis not present

## 2022-08-06 DIAGNOSIS — I70222 Atherosclerosis of native arteries of extremities with rest pain, left leg: Secondary | ICD-10-CM | POA: Diagnosis not present

## 2022-08-06 DIAGNOSIS — E039 Hypothyroidism, unspecified: Secondary | ICD-10-CM | POA: Diagnosis not present

## 2022-08-06 DIAGNOSIS — C679 Malignant neoplasm of bladder, unspecified: Secondary | ICD-10-CM | POA: Diagnosis not present

## 2022-08-06 DIAGNOSIS — I82411 Acute embolism and thrombosis of right femoral vein: Secondary | ICD-10-CM | POA: Diagnosis not present

## 2022-08-06 DIAGNOSIS — D45 Polycythemia vera: Secondary | ICD-10-CM | POA: Diagnosis not present

## 2022-08-06 DIAGNOSIS — N1832 Chronic kidney disease, stage 3b: Secondary | ICD-10-CM | POA: Diagnosis not present

## 2022-08-06 DIAGNOSIS — C946 Myelodysplastic disease, not classified: Secondary | ICD-10-CM | POA: Diagnosis not present

## 2022-08-06 DIAGNOSIS — I129 Hypertensive chronic kidney disease with stage 1 through stage 4 chronic kidney disease, or unspecified chronic kidney disease: Secondary | ICD-10-CM | POA: Diagnosis not present

## 2022-08-06 DIAGNOSIS — R42 Dizziness and giddiness: Secondary | ICD-10-CM | POA: Diagnosis not present

## 2022-08-06 DIAGNOSIS — I483 Typical atrial flutter: Secondary | ICD-10-CM | POA: Diagnosis not present

## 2022-08-09 ENCOUNTER — Ambulatory Visit (INDEPENDENT_AMBULATORY_CARE_PROVIDER_SITE_OTHER): Payer: Medicare PPO

## 2022-08-09 VITALS — Ht 70.5 in | Wt 154.0 lb

## 2022-08-09 DIAGNOSIS — Z0000 Encounter for general adult medical examination without abnormal findings: Secondary | ICD-10-CM

## 2022-08-09 DIAGNOSIS — Z5900 Homelessness unspecified: Secondary | ICD-10-CM

## 2022-08-09 NOTE — Patient Instructions (Signed)
Jerry Mcpherson , Thank you for taking time to come for your Medicare Wellness Visit. I appreciate your ongoing commitment to your health goals. Please review the following plan we discussed and let me know if I can assist you in the future.   These are the goals we discussed:  Goals       care coordination services (pt-stated)      Care Coordination Interventions:  Needs assessment completed/SDOH completed Case Management Program discussed Patient currently homeless, lives in his truck confirms that he is working with the Department of Social Services to find housing-Section 8 application submitted Patient declines stay in boarding house or [facility]-will remain in truck until "something else opens up" Patient denies having any other needs at this time Next PCP appointment scheduled for 02/05/22, patient reminded to schedule his AWV      DIET - EAT MORE FRUITS AND VEGETABLES      Find Help in My Community      Timeframe:  Long-Range Goal Priority:  High Start Date:     01/26/21                        Expected End Date:    03/04/21                  Follow Up Date 03/04/21    - call 211 when I need some help - follow-up on any referrals for help I am given-[place]-[facility]/[employer]/Boarding Houses/Section 8 when needed - think ahead to make sure my need does not become an emergency - have a back-up plan - make a list of family or friends that I can call     Why is this important?   Knowing how and where to find help for yourself or family in your neighborhood and community is an important skill.  You will want to take some steps to learn how.    Notes:       Have 3 meals a day      Recommend eating three healthy meals a day.         This is a list of the screening recommended for you and due dates:  Health Maintenance  Topic Date Due   Zoster (Shingles) Vaccine (1 of 2) Never done   DTaP/Tdap/Td vaccine (2 - Td or Tdap) 10/27/2021   COVID-19 Vaccine  (3 - Moderna risk series) 10/04/2022*   Flu Shot  11/11/2022   Medicare Annual Wellness Visit  08/09/2023   Pneumonia Vaccine  Completed   HPV Vaccine  Aged Out  *Topic was postponed. The date shown is not the original due date.    Advanced directives: no  Conditions/risks identified: low falls risk  Next appointment: Follow up in one year for your annual wellness visit. 08/10/2023 @3 :30pm telephone  Preventive Care 65 Years and Older, Male  Preventive care refers to lifestyle choices and visits with your health care provider that can promote health and wellness. What does preventive care include? A yearly physical exam. This is also called an annual well check. Dental exams once or twice a year. Routine eye exams. Ask your health care provider how often you should have your eyes checked. Personal lifestyle choices, including: Daily care of your teeth and gums. Regular physical activity. Eating a healthy diet. Avoiding tobacco and drug use. Limiting alcohol use. Practicing safe sex. Taking low doses of aspirin every day. Taking vitamin and mineral supplements as recommended by  your health care provider. What happens during an annual well check? The services and screenings done by your health care provider during your annual well check will depend on your age, overall health, lifestyle risk factors, and family history of disease. Counseling  Your health care provider may ask you questions about your: Alcohol use. Tobacco use. Drug use. Emotional well-being. Home and relationship well-being. Sexual activity. Eating habits. History of falls. Memory and ability to understand (cognition). Work and work Astronomer. Screening  You may have the following tests or measurements: Height, weight, and BMI. Blood pressure. Lipid and cholesterol levels. These may be checked every 5 years, or more frequently if you are over 13 years old. Skin check. Lung cancer screening. You may  have this screening every year starting at age 39 if you have a 30-pack-year history of smoking and currently smoke or have quit within the past 15 years. Fecal occult blood test (FOBT) of the stool. You may have this test every year starting at age 68. Flexible sigmoidoscopy or colonoscopy. You may have a sigmoidoscopy every 5 years or a colonoscopy every 10 years starting at age 89. Prostate cancer screening. Recommendations will vary depending on your family history and other risks. Hepatitis C blood test. Hepatitis B blood test. Sexually transmitted disease (STD) testing. Diabetes screening. This is done by checking your blood sugar (glucose) after you have not eaten for a while (fasting). You may have this done every 1-3 years. Abdominal aortic aneurysm (AAA) screening. You may need this if you are a current or former smoker. Osteoporosis. You may be screened starting at age 53 if you are at high risk. Talk with your health care provider about your test results, treatment options, and if necessary, the need for more tests. Vaccines  Your health care provider may recommend certain vaccines, such as: Influenza vaccine. This is recommended every year. Tetanus, diphtheria, and acellular pertussis (Tdap, Td) vaccine. You may need a Td booster every 10 years. Zoster vaccine. You may need this after age 2. Pneumococcal 13-valent conjugate (PCV13) vaccine. One dose is recommended after age 78. Pneumococcal polysaccharide (PPSV23) vaccine. One dose is recommended after age 50. Talk to your health care provider about which screenings and vaccines you need and how often you need them. This information is not intended to replace advice given to you by your health care provider. Make sure you discuss any questions you have with your health care provider. Document Released: 04/25/2015 Document Revised: 12/17/2015 Document Reviewed: 01/28/2015 Elsevier Interactive Patient Education  2017 [employer].  Fall Prevention in the Home Falls can cause injuries. They can happen to people of all ages. There are many things you can do to make your home safe and to help prevent falls. What can I do on the outside of my home? Regularly fix the edges of walkways and driveways and fix any cracks. Remove anything that might make you trip as you walk through a door, such as a raised step or threshold. Trim any bushes or trees on the path to your home. Use bright outdoor lighting. Clear any walking paths of anything that might make someone trip, such as rocks or tools. Regularly check to see if handrails are loose or broken. Make sure that both sides of any steps have handrails. Any raised decks and porches should have guardrails on the edges. Have any leaves, snow, or ice cleared regularly. Use sand or salt on walking paths during winter. Clean up any spills in your garage  right away. This includes oil or grease spills. What can I do in the bathroom? Use night lights. Install grab bars by the toilet and in the tub and shower. Do not use towel bars as grab bars. Use non-skid mats or decals in the tub or shower. If you need to sit down in the shower, use a plastic, non-slip stool. Keep the floor dry. Clean up any water that spills on the floor as soon as it happens. Remove soap buildup in the tub or shower regularly. Attach bath mats securely with double-sided non-slip rug tape. Do not have throw rugs and other things on the floor that can make you trip. What can I do in the bedroom? Use night lights. Make sure that you have a light by your bed that is easy to reach. Do not use any sheets or blankets that are too big for your bed. They should not hang down onto the floor. Have a firm chair that has side arms. You can use this for support while you get dressed. Do not have throw rugs and other things on the floor that can make you trip. What can I do in the kitchen? Clean up any spills right  away. Avoid walking on wet floors. Keep items that you use a lot in easy-to-reach places. If you need to reach something above you, use a strong step stool that has a grab bar. Keep electrical cords out of the way. Do not use floor polish or wax that makes floors slippery. If you must use wax, use non-skid floor wax. Do not have throw rugs and other things on the floor that can make you trip. What can I do with my stairs? Do not leave any items on the stairs. Make sure that there are handrails on both sides of the stairs and use them. Fix handrails that are broken or loose. Make sure that handrails are as long as the stairways. Check any carpeting to make sure that it is firmly attached to the stairs. Fix any carpet that is loose or worn. Avoid having throw rugs at the top or bottom of the stairs. If you do have throw rugs, attach them to the floor with carpet tape. Make sure that you have a light switch at the top of the stairs and the bottom of the stairs. If you do not have them, ask someone to add them for you. What else can I do to help prevent falls? Wear shoes that: Do not have high heels. Have rubber bottoms. Are comfortable and fit you well. Are closed at the toe. Do not wear sandals. If you use a stepladder: Make sure that it is fully opened. Do not climb a closed stepladder. Make sure that both sides of the stepladder are locked into place. Ask someone to hold it for you, if possible. Clearly mark and make sure that you can see: Any grab bars or handrails. First and last steps. Where the edge of each step is. Use tools that help you move around (mobility aids) if they are needed. These include: Canes. Walkers. Scooters. Crutches. Turn on the lights when you go into a dark area. Replace any light bulbs as soon as they burn out. Set up your furniture so you have a clear path. Avoid moving your furniture around. If any of your floors are uneven, fix them. If there are any  pets around you, be aware of where they are. Review your medicines with your doctor. Some medicines can make  you feel dizzy. This can increase your chance of falling. Ask your doctor what other things that you can do to help prevent falls. This information is not intended to replace advice given to you by your health care provider. Make sure you discuss any questions you have with your health care provider. Document Released: 01/23/2009 Document Revised: 09/04/2015 Document Reviewed: 05/03/2014 Elsevier Interactive Patient Education  2017 [employer].

## 2022-08-09 NOTE — Progress Notes (Signed)
I connected with  Jerry Mcpherson on 08/09/22 by a audio enabled telemedicine application and verified that I am speaking with the correct person using two identifiers.  Patient Location: Home  Provider Location: Home Office  I discussed the limitations of evaluation and management by telemedicine. The patient expressed understanding and agreed to proceed.  Subjective:   Jerry Mcpherson is a 82 y.o. male who presents for Medicare Annual/Subsequent preventive examination.  Review of Systems    Cardiac Risk Factors include: advanced age (>37men, >82 women);dyslipidemia;male gender;sedentary lifestyle    Objective:    Today's Vitals   08/09/22 1532 08/09/22 1533  Weight: 154 lb (69.9 kg)   Height: 5' 10.5" (1.791 m)   PainSc:  5    Body mass index is 21.78 kg/m.     08/09/2022    3:48 PM 07/23/2021    8:52 AM 07/17/2020    9:16 AM 07/16/2019    3:02 PM 08/26/2017   10:10 AM 08/24/2016    1:27 PM 08/24/2016    1:21 PM  Advanced Directives  Does Patient Have a Medical Advance Directive? No No No No No No No  Would patient like information on creating a medical advance directive?  No - Patient declined No - Patient declined No - Patient declined No - Patient declined      Current Medications (verified) Outpatient Encounter Medications as of 08/09/2022  Medication Sig   amLODipine (NORVASC) 2.5 MG tablet TAKE ONE (1) TABLET BY MOUTH ONCE DAILY   amoxicillin-clavulanate (AUGMENTIN) 875-125 MG tablet TAKE 1 TABLET BY MOUTH TWICE A DAY FOR 10 DAYS   Ascorbic Acid (VITAMIN C) 1000 MG tablet Take 2 tablets by mouth daily.   aspirin 81 MG tablet Take 2 tablets daily   budesonide-formoterol (SYMBICORT) 160-4.5 MCG/ACT inhaler Inhale 2 puffs into the lungs 2 (two) times daily.   clopidogrel (PLAVIX) 75 MG tablet Take 75 mg by mouth daily.   cyproheptadine (PERIACTIN) 4 MG tablet TAKE 1/2-1 TABLET BY MOUTH THREE TIMES DAILY AS NEEDED TO HELP APPETITE   diclofenac Sodium (VOLTAREN) 1 %  GEL Apply 4 g topically 4 (four) times daily as needed.   docusate sodium (COLACE) 100 MG capsule Take 1 capsule by mouth daily as needed.   ELIQUIS 5 MG TABS tablet Take 1 tablet (5 mg total) by mouth 2 (two) times daily.   fluticasone (FLONASE) 50 MCG/ACT nasal spray Place 2 sprays into both nostrils daily.   hydrochlorothiazide (HYDRODIURIL) 25 MG tablet Take 1 tablet (25 mg total) by mouth daily.   hydroxyurea (HYDREA) 500 MG capsule Take 500 mg by mouth as directed. 2 tablets 3 days a week, and 1 tablet all other days   levothyroxine (SYNTHROID) 75 MCG tablet TAKE 1 TABLET BY MOUTH EVERY DAY   metoprolol succinate (TOPROL-XL) 50 MG 24 hr tablet Take 1 tablet (50 mg total) by mouth daily.   Multiple Vitamins-Minerals (MULTIVITAMIN ADULT PO) Take 1 tablet by mouth daily.   mupirocin cream (BACTROBAN) 2 % Apply 1 application  topically 2 (two) times daily.   naproxen (NAPROSYN) 500 MG tablet TAKE 1 TABLET(500 MG) BY MOUTH TWICE DAILY WITH A MEAL   oxyCODONE-acetaminophen (PERCOCET) 10-325 MG tablet Take 1 tablet by mouth every 6 (six) hours as needed for pain.   potassium chloride (KLOR-CON) 10 MEQ tablet Take by mouth.   promethazine (PHENERGAN) 12.5 MG tablet TAKE 1 TO 2 TABLETS(12.5 TO 25 MG) BY MOUTH EVERY 8 HOURS AS NEEDED FOR NAUSEA OR VOMITING  sildenafil (VIAGRA) 50 MG tablet Take 0.5-1 tablets by mouth daily as needed.   valACYclovir (VALTREX) 1000 MG tablet TAKE 1 TABLET BY MOUTH TWICE A DAY AS NEEDED   No facility-administered encounter medications on file as of 08/09/2022.    Allergies (verified) Losartan potassium, Shellfish allergy, Ultram  [tramadol hcl], Celecoxib, Codeine, Cyclobenzaprine, and Tramadol hcl   History: Past Medical History:  Diagnosis Date   Acute deep vein thrombosis (DVT) of right femoral vein (HCC) 10/28/2020   [place] 10/24/2020 [facility] Acute right lower extremity DVT extending from the common femoral vein to  the popliteal vein.     Bladder cancer (HCC)     Genital herpes    Hyperglycemia    Hypertension    Squamous cell carcinoma    Past Surgical History:  Procedure Laterality Date   CATARACT EXTRACTION Left 10/2012   Dr. Sharman Crate; [facility]   CYSTECTOMY W/ URETEROILEAL CONDUIT  07/24/2020   [facility] for Stage IV bladder cancer extending to prostate   EYE SURGERY Left    removed a piece of steel   FEMORAL ARTERY STENT Left 06/16/2022   [facility]   HERNIA REPAIR  12/08/2006   umbilical hernia, incarderated; Dr. Lemar Livings   PROSTATECTOMY  4/14/2-22   [facility] for Stage IV bladder cancer extending to prostate   ROTATOR CUFF REPAIR     SQUAMOUS CELL CARCINOMA EXCISION  2008   TONSILLECTOMY     Family History  Problem Relation Age of Onset   Hypertension Mother    Heart attack Mother    Stroke Father    Cancer Father    COPD Sister    Congestive Heart Failure Brother    Social History   Socioeconomic History   Marital status: Divorced    Spouse name: Not on file   Number of children: 1   Years of education: Not on file   Highest education level: Some college, no degree  Occupational History   Occupation: Retired   Occupation: Sales executive part time  Tobacco Use   Smoking status: Former    Packs/day: 3.00    Years: 15.00    Additional pack years: 0.00    Total pack years: 45.00    Types: Cigarettes    Quit date: 04/12/1968    Years since quitting: 54.3   Smokeless tobacco: Never  Vaping Use   Vaping Use: Never used  Substance and Sexual Activity   Alcohol use: No    Alcohol/week: 0.0 standard drinks of alcohol   Drug use: No   Sexual activity: Not on file  Other Topics Concern   Not on file  Social History Narrative   Not on file   Social Determinants of Health   Financial Resource Strain: Medium Risk (08/09/2022)   Overall Financial Resource Strain (CARDIA)    Difficulty of Paying Living Expenses: Somewhat hard  Food Insecurity: No Food Insecurity (08/09/2022)   Hunger Vital Sign     Worried About Running Out of Food in the Last Year: Never true    Ran Out of Food in the Last Year: Never true  Transportation Needs: No Transportation Needs (08/09/2022)   PRAPARE - Administrator, Civil Service (Medical): No    Lack of Transportation (Non-Medical): No  Physical Activity: Inactive (08/09/2022)   Exercise Vital Sign    Days of Exercise per Week: 0 days    Minutes of Exercise per Session: 0 min  Stress: No Stress Concern Present (08/09/2022)   [place]  Institute of Occupational Health - Occupational Stress Questionnaire    Feeling of Stress : Not at all  Social Connections: Socially Isolated (08/09/2022)   Social Connection and Isolation Panel [NHANES]    Frequency of Communication with Friends and Family: More than three times a week    Frequency of Social Gatherings with Friends and Family: More than three times a week    Attends Religious Services: Never    Database administrator or Organizations: No    Attends Engineer, structural: Never    Marital Status: Divorced    Tobacco Counseling Counseling given: Not Answered   Clinical Intake:  Pre-visit preparation completed: Yes  Pain : 0-10 Pain Score: 5  Pain Type: Acute pain Pain Location: Leg Pain Orientation: Left Pain Descriptors / Indicators: Aching Pain Onset: 1 to 4 weeks ago Pain Frequency: Intermittent Pain Relieving Factors: Tylenol  Pain Relieving Factors: Tylenol  BMI - recorded: 21.78 Nutritional Status: BMI of 19-24  Normal Nutritional Risks: None Diabetes: No  How often do you need to have someone help you when you read instructions, pamphlets, or other written materials from your doctor or pharmacy?: 1 - Never  Diabetic?no  Interpreter Needed?: No  Comments: lives alone;pt says he is homeless Information entered by :: B.Barkley Kratochvil,LPN   Activities of Daily Living    08/09/2022    3:51 PM 07/09/2022    2:48 PM  In your present state of health, do you have any  difficulty performing the following activities:  Hearing? 1 1  Vision? 1 1  Difficulty concentrating or making decisions? 0 0  Walking or climbing stairs? 1 1  Dressing or bathing? 0 0  Doing errands, shopping? 0 0  Preparing Food and eating ? N   Using the Toilet? N   In the past six months, have you accidently leaked urine? Y   Do you have problems with loss of bowel control? N   Managing your Medications? N   Managing your Finances? N   Housekeeping or managing your Housekeeping? N     Patient Care Team: Malva Limes, MD as PCP - General (Family Medicine) O'Branski, Kermit Balo, PA (Oncology) System, Provider Not In Jennette Kettle, MD (Urology) Hoimes, Canary Brim, DO as Referring Physician (Hematology and Oncology) Rendall, Kermit Balo, PA (Cardiology) Ander Purpura, MD (Vascular Surgery)  Indicate any recent Medical Services you may have received from other than [facility] providers in the past year (date may be approximate).     Assessment:   This is a routine wellness examination for Alyan.  Hearing/Vision screen Hearing Screening - Comments:: Adequate hearing w/hearing aides Vision Screening - Comments:: Adequate vision w/glasses Dr Clearance Coots  Dietary issues and exercise activities discussed: Current Exercise Habits: The patient does not participate in regular exercise at present, Exercise limited by: orthopedic condition(s);neurologic condition(s) (just had surgery on leg)   Goals Addressed               This Visit's Progress     care coordination services (pt-stated)   Not on track     Care Coordination Interventions:  Needs assessment completed/SDOH completed Case Management Program discussed Patient currently homeless, lives in his truck confirms that he is working with the Department of Social Services to find housing-Section 8 application submitted Patient declines stay [facility] boarding house or [facility]-will remain in truck until "something else  opens up" Patient denies having any other needs at this time Next PCP appointment scheduled for 02/05/22,  patient reminded to schedule his AWV      DIET - EAT MORE FRUITS AND VEGETABLES   On track     Find Help in My Community   Not on track     Timeframe:  Long-Range Goal Priority:  High Start Date:     01/26/21                        Expected End Date:    03/04/21                  Follow Up Date 03/04/21    - call 211 when I need some help - follow-up on any referrals for help I am given-[facility]/Allied Churches/Boarding Houses/Section 8 when needed - think ahead to make sure my need does not become an emergency - have a back-up plan - make a list of family or friends that I can call     Why is this important?   Knowing how and where to find help for yourself or family in your neighborhood and community is an important skill.  You will want to take some steps to learn how.    Notes:       Have 3 meals a day   No change     Recommend eating three healthy meals a day.        Depression Screen    08/09/2022    3:45 PM 07/09/2022    2:48 PM 02/05/2022   11:04 AM 11/19/2021    4:17 PM 07/23/2021    8:50 AM 07/17/2020    9:13 AM 01/09/2020    1:44 PM  PHQ 2/9 Scores  PHQ - 2 Score 0 0 0 0 0 0 0  PHQ- 9 Score  1 2    1     Fall Risk    08/09/2022    3:43 PM 07/09/2022    2:48 PM 07/23/2021    8:53 AM 07/17/2020    9:17 AM 01/09/2020    1:45 PM  Fall Risk   Falls in the past year? 0 0 0 0 1  Number falls in past yr: 0 0 0 0 0  Injury with Fall? 0 0 0 0 1  Risk for fall due to : No Fall Risks No Fall Risks No Fall Risks    Follow up Education provided;Falls prevention discussed Falls evaluation completed Falls evaluation completed      FALL RISK PREVENTION PERTAINING TO THE HOME: Pt homeless  Any stairs in or around the home? No  If so, are there any without handrails? No  Home free of loose throw rugs in walkways, pet beds, electrical cords, etc? No   Adequate lighting in your home to reduce risk of falls? No   ASSISTIVE DEVICES UTILIZED TO PREVENT FALLS:  Life alert? No  Use of a cane, walker or w/c? No  Grab bars in the bathroom? No  Shower chair or bench in shower? No  Elevated toilet seat or a handicapped toilet? No    Cognitive Function:        08/09/2022    3:54 PM 08/26/2017   10:16 AM 08/24/2016    1:28 PM  6CIT Screen  What Year? 0 points 0 points 0 points  What month? 0 points 0 points 0 points  What time? 0 points 0 points 0 points  Count back from 20 0 points 0 points 0 points  Months in reverse 0 points 0  points 0 points  Repeat phrase 2 points 0 points 2 points  Total Score 2 points 0 points 2 points    Immunizations Immunization History  Administered Date(s) Administered   Fluad Quad(high Dose 65+) 02/02/2019, 01/09/2020, 01/23/2021, 02/05/2022   Influenza Split 01/01/2009   Influenza, High Dose Seasonal PF 12/31/2013, 12/12/2014, 01/28/2016, 02/03/2017, 12/14/2017   Influenza-Unspecified 05/22/2017   Moderna Sars-Covid-2 Vaccination 05/26/2019, 06/23/2019   Pneumococcal Conjugate-13 12/31/2013   Pneumococcal Polysaccharide-23 02/22/2004   Tdap 10/28/2011   Zoster, Live 01/28/2009    TDAP status: Up to date  Flu Vaccine status: Up to date  Pneumococcal vaccine status: Up to date  Covid-19 vaccine status: Completed vaccines  Qualifies for Shingles Vaccine? Yes   Zostavax completed No   Shingrix Completed?: No.    Education has been provided regarding the importance of this vaccine. Patient has been advised to call insurance company to determine out of pocket expense if they have not yet received this vaccine. Advised may also receive vaccine at local pharmacy or Health Dept. Verbalized acceptance and understanding.  Screening Tests Health Maintenance  Topic Date Due   Zoster Vaccines- Shingrix (1 of 2) Never done   DTaP/Tdap/Td (2 - Td or Tdap) 10/27/2021   COVID-19 Vaccine (3 - Moderna risk  series) 10/04/2022 (Originally 07/21/2019)   INFLUENZA VACCINE  11/11/2022   Medicare Annual Wellness (AWV)  08/09/2023   Pneumonia Vaccine 39+ Years old  Completed   HPV VACCINES  Aged Out    Health Maintenance  Health Maintenance Due  Topic Date Due   Zoster Vaccines- Shingrix (1 of 2) Never done   DTaP/Tdap/Td (2 - Td or Tdap) 10/27/2021    Colorectal cancer screening: No longer required.   Lung Cancer Screening: (Low Dose CT Chest recommended if Age 60-80 years, 30 pack-year currently smoking OR have quit w/in 15years.) does not qualify.   Lung Cancer Screening Referral: no  Additional Screening:  Hepatitis C Screening: does not qualify; Completed yes  Vision Screening: Recommended annual ophthalmology exams for early detection of glaucoma and other disorders of the eye. Is the patient up to date with their annual eye exam?  Yes  Who is the provider or what is the name of the office in which the patient attends annual eye exams? Dr Clearance Coots If pt is not established with a provider, would they like to be referred to a provider to establish care? No .   Dental Screening: Recommended annual dental exams for proper oral hygiene  Community Resource Referral / Chronic Care Management: CRR required this visit?  No   CCM required this visit?  No      Plan:     I have personally reviewed and noted the following in the patient's chart:   Medical and social history Use of alcohol, tobacco or illicit drugs  Current medications and supplements including opioid prescriptions. Patient is currently taking opioid prescriptions. Information provided to patient regarding non-opioid alternatives. Patient advised to discuss non-opioid treatment plan with their provider. Functional ability and status Nutritional status Physical activity Advanced directives List of other physicians Hospitalizations, surgeries, and ER visits in previous 12 months Vitals Screenings to include cognitive,  depression, and falls Referrals and appointments  In addition, I have reviewed and discussed with patient certain preventive protocols, quality metrics, and best practice recommendations. A written personalized care plan for preventive services as well as general preventive health recommendations were provided to patient.     Sue Lush, LPN   1/61/0960  Nurse Notes: Pt relays he is homeless, sleeps in his care at nephews house. Relays there is no room there for him. He has no success at finding a safe place to live. Pt has just had vascular surgery. He states they told him to watch for signs of infection but he relays he cannot see the site. He says he will go to nephews and let his check for him. Pt encouraged to call vascular surgeon with any problems and if signs of infection as they instructed. Pt concurs he will do.  *Referral for SW and community resources made

## 2022-08-19 ENCOUNTER — Ambulatory Visit: Payer: Self-pay

## 2022-08-19 ENCOUNTER — Other Ambulatory Visit: Payer: Self-pay | Admitting: Family Medicine

## 2022-08-19 DIAGNOSIS — G589 Mononeuropathy, unspecified: Secondary | ICD-10-CM

## 2022-08-19 DIAGNOSIS — M5410 Radiculopathy, site unspecified: Secondary | ICD-10-CM

## 2022-08-19 NOTE — Telephone Encounter (Signed)
Requested medication (s) are due for refill today: yes  Requested medication (s) are on the active medication list: yes  Last refill:  07/22/22 #120  Future visit scheduled: yes  Notes to clinic:  med not delegated to NT to RF   Requested Prescriptions  Pending Prescriptions Disp Refills   oxyCODONE-acetaminophen (PERCOCET) 10-325 MG tablet 120 tablet 0    Sig: Take 1 tablet by mouth every 6 (six) hours as needed for pain.     Not Delegated - Analgesics:  Opioid Agonist Combinations Failed - 08/19/2022  3:55 PM      Failed - This refill cannot be delegated      Failed - Urine Drug Screen completed in last 360 days      Passed - Valid encounter within last 3 months    Recent Outpatient Visits           1 month ago Cellulitis of toe of left foot   [facility] Malva Limes, MD   1 month ago Cellulitis of toe of left foot   [facility] [facility] Malva Limes, MD   6 months ago Hypothyroidism, unspecified type   [facility] Malva Limes, MD   10 months ago Pilonidal cyst   [facility] Malva Limes, MD   11 months ago Pilonidal cyst   [facility]       Future Appointments             Tomorrow [name], PA-C [facility]

## 2022-08-19 NOTE — Telephone Encounter (Signed)
Reason for Disposition . [1] Finished taking antibiotics AND [2] symptoms are BETTER but [3] not completely gone  Answer Assessment - Initial Assessment Questions 1. INFECTION: "What infection is the antibiotic being given for?"     Infection in toe 2. ANTIBIOTIC: "What antibiotic are you taking" "How many times per day?"      3. DURATION: "When was the antibiotic started?"      4. MAIN CONCERN OR SYMPTOM:  "What is your main concern right now?"     Still infected 5. BETTER-SAME-WORSE: "Are you getting better, staying the same, or getting worse compared to when you first started the antibiotics?" If getting worse, ask: "In what way?"      Better 6. FEVER: "Do you have a fever?" If Yes, ask: "What is your temperature, how was it measured, and when did it start?"      7. SYMPTOMS: "Are there any other symptoms you're concerned about?" If Yes, ask: "When did it start?"      8. FOLLOW-UP APPOINTMENT: "Do you have a follow-up appointment with your doctor?"  Protocols used: Infection on Antibiotic Follow-up Call-[facility]

## 2022-08-19 NOTE — Telephone Encounter (Signed)
Medication Refill - Medication: oxyCODONE-acetaminophen (PERCOCET) 10-325 MG tablet   Has the patient contacted their pharmacy? No. (Agent: If no, request that the patient contact the pharmacy for the refill. If patient does not wish to contact the pharmacy document the reason why and proceed with request.) (Agent: If yes, when and what did the pharmacy advise?)  Preferred Pharmacy (with phone number or street name):  [facility] [place], [place] - [address] [place] [address] Phone: [phone]  Fax: [phone]  Has the patient been seen for an appointment in the last year OR does the patient have an upcoming appointment? Yes.    Agent: Please be advised that RX refills may take up to 3 business days. We ask that you follow-up with your pharmacy.

## 2022-08-19 NOTE — Telephone Encounter (Signed)
  Chief Complaint: Toe still infected Symptoms: infection red painful Frequency:  Pertinent Negatives: Patient denies  Disposition: [] ED /[] Urgent Care (no appt availability in office) / [x] Appointment(In office/virtual)/ []  [place] [facility]/ [] Home Care/ [] Refused Recommended Disposition /[] [place] [facility]/ []  Follow-up with PCP Additional Notes: PT states that infection in his toe is better, but not at all gone. PT has completed abx.    Summary: Cellulitis of toe   Patient is being treated for cellulitis of his toe on left foot. Patient is finishing his second round of a 7-day course of Augmentin with no improvement. Next ov available with PCP is 5/20. Please advise.

## 2022-08-20 ENCOUNTER — Encounter: Payer: Self-pay | Admitting: Physician Assistant

## 2022-08-20 ENCOUNTER — Ambulatory Visit (INDEPENDENT_AMBULATORY_CARE_PROVIDER_SITE_OTHER): Payer: Medicare PPO | Admitting: Physician Assistant

## 2022-08-20 VITALS — BP 132/71 | HR 79 | Wt 150.5 lb

## 2022-08-20 DIAGNOSIS — Z936 Other artificial openings of urinary tract status: Secondary | ICD-10-CM | POA: Diagnosis not present

## 2022-08-20 DIAGNOSIS — C679 Malignant neoplasm of bladder, unspecified: Secondary | ICD-10-CM | POA: Diagnosis not present

## 2022-08-20 DIAGNOSIS — L97529 Non-pressure chronic ulcer of other part of left foot with unspecified severity: Secondary | ICD-10-CM

## 2022-08-20 DIAGNOSIS — L03032 Cellulitis of left toe: Secondary | ICD-10-CM | POA: Diagnosis not present

## 2022-08-20 MED ORDER — OXYCODONE-ACETAMINOPHEN 10-325 MG PO TABS
1.0000 | ORAL_TABLET | Freq: Four times a day (QID) | ORAL | 0 refills | Status: DC | PRN
Start: 2022-08-20 — End: 2022-09-20

## 2022-08-20 NOTE — Progress Notes (Signed)
I,Jerry Mcpherson,acting as a Neurosurgeon for [employer], PA-C.,have documented all relevant documentation on the behalf of Jerry Ferguson, PA-C,as directed by  Jerry Ferguson, PA-C while in the presence of Jerry Ferguson, PA-C.   Established patient visit   Patient: Jerry Mcpherson   DOB: 1941/01/19   82 y.o. Male  MRN: [id number] Visit Date: 08/20/2022  Today's healthcare provider: [name], PA-C   Cc. Infection left foot   Subjective    HPI  Pt was seen 07/09/22 and 07/12/22 for cellulitis of a toe on his left foot, treated with 10 days of augmentin and advised to soak his foot. Patient repots finishing abx last night. Pt follows with vascular. Reports he did not like the podiatrist he saw as that doctor recommended he have an amputation.  Medications: Outpatient Medications Prior to Visit  Medication Sig   amLODipine (NORVASC) 2.5 MG tablet TAKE ONE (1) TABLET BY MOUTH ONCE DAILY   amoxicillin-clavulanate (AUGMENTIN) 875-125 MG tablet TAKE 1 TABLET BY MOUTH TWICE A DAY FOR 10 DAYS   Ascorbic Acid (VITAMIN C) 1000 MG tablet Take 2 tablets by mouth daily.   aspirin 81 MG tablet Take 2 tablets daily   budesonide-formoterol (SYMBICORT) 160-4.5 MCG/ACT inhaler Inhale 2 puffs into the lungs 2 (two) times daily.   clopidogrel (PLAVIX) 75 MG tablet Take 75 mg by mouth daily.   cyproheptadine (PERIACTIN) 4 MG tablet TAKE 1/2-1 TABLET BY MOUTH THREE TIMES DAILY AS NEEDED TO HELP APPETITE   diclofenac Sodium (VOLTAREN) 1 % GEL Apply 4 g topically 4 (four) times daily as needed.   docusate sodium (COLACE) 100 MG capsule Take 1 capsule by mouth daily as needed.   ELIQUIS 5 MG TABS tablet Take 1 tablet (5 mg total) by mouth 2 (two) times daily.   fluticasone (FLONASE) 50 MCG/ACT nasal spray Place 2 sprays into both nostrils daily.   hydrochlorothiazide (HYDRODIURIL) 25 MG tablet Take 1 tablet (25 mg total) by mouth daily.   hydroxyurea (HYDREA) 500 MG capsule Take 500 mg by mouth as  directed. 2 tablets 3 days a week, and 1 tablet all other days   levothyroxine (SYNTHROID) 75 MCG tablet TAKE 1 TABLET BY MOUTH EVERY DAY   metoprolol succinate (TOPROL-XL) 50 MG 24 hr tablet Take 1 tablet (50 mg total) by mouth daily.   Multiple Vitamins-Minerals (MULTIVITAMIN ADULT PO) Take 1 tablet by mouth daily.   mupirocin cream (BACTROBAN) 2 % Apply 1 application  topically 2 (two) times daily.   naproxen (NAPROSYN) 500 MG tablet TAKE 1 TABLET(500 MG) BY MOUTH TWICE DAILY WITH A MEAL   oxyCODONE-acetaminophen (PERCOCET) 10-325 MG tablet Take 1 tablet by mouth every 6 (six) hours as needed for pain.   potassium chloride (KLOR-CON) 10 MEQ tablet Take by mouth.   promethazine (PHENERGAN) 12.5 MG tablet TAKE 1 TO 2 TABLETS(12.5 TO 25 MG) BY MOUTH EVERY 8 HOURS AS NEEDED FOR NAUSEA OR VOMITING   sildenafil (VIAGRA) 50 MG tablet Take 0.5-1 tablets by mouth daily as needed.   valACYclovir (VALTREX) 1000 MG tablet TAKE 1 TABLET BY MOUTH TWICE A DAY AS NEEDED   No facility-administered medications prior to visit.      Objective    BP 132/71 (BP Location: Right Arm, Patient Position: Sitting, Cuff Size: Normal)   Pulse 79   Wt 150 lb 8 oz (68.3 kg)   SpO2 98%   BMI 21.29 kg/m    Physical Exam Vitals reviewed.  Constitutional:  Appearance: He is not ill-appearing.     Comments: Pt is very dirty, clothes are soiled and dirt under nails.   HENT:     Head: Normocephalic.  Eyes:     Conjunctiva/sclera: Conjunctivae normal.  Cardiovascular:     Rate and Rhythm: Normal rate.  Pulmonary:     Effort: Pulmonary effort is normal. No respiratory distress.  Feet:     Comments: Left foot with no erythema, pulses faint. Clinically no evidence of overt cellulitis, there is a scabbed ulcer underneath second toe. Neurological:     General: No focal deficit present.     Mental Status: He is alert and oriented to person, place, and time.  Psychiatric:        Mood and Affect: Mood normal.         Behavior: Behavior normal.     No results found for any visits on 08/20/22.  Assessment & Plan     1. Cellulitis of toe of left foot 2. Foot ulceration, left, with unspecified severity (HCC) At this time no additional abx indicated. Advised general hygiene, caution of recurrent infection. Referring to local podiatrist for ongoing management of ulcers/etc  - Ambulatory referral to Podiatry   Return if symptoms worsen or fail to improve f/u with pcp.      I, Jerry Ferguson, PA-C have reviewed all documentation for this visit. The documentation on  08/20/22   for the exam, diagnosis, procedures, and orders are all accurate and complete.  Jerry Ferguson, PA-C [facility] [address] [place], [place], [address] Office: [phone] Fax: [phone]   [facility]

## 2022-08-23 ENCOUNTER — Telehealth: Payer: Self-pay

## 2022-08-23 NOTE — Telephone Encounter (Signed)
Patient has appointment in the morning

## 2022-08-23 NOTE — Telephone Encounter (Signed)
Copied from CRM [id number]. Topic: Appointment Scheduling - Scheduling Inquiry for Clinic >> Aug 23, 2022  8:41 AM Jerry Mcpherson wrote: Reason for CRM: Pt says that he is waiting for an appt at the [facility] clinic for his toes, please advise. Says a male at the office discussed this with him on Friday

## 2022-08-24 DIAGNOSIS — I739 Peripheral vascular disease, unspecified: Secondary | ICD-10-CM | POA: Diagnosis not present

## 2022-08-24 DIAGNOSIS — B351 Tinea unguium: Secondary | ICD-10-CM | POA: Diagnosis not present

## 2022-08-24 DIAGNOSIS — M79675 Pain in left toe(s): Secondary | ICD-10-CM | POA: Diagnosis not present

## 2022-08-24 DIAGNOSIS — L97522 Non-pressure chronic ulcer of other part of left foot with fat layer exposed: Secondary | ICD-10-CM | POA: Diagnosis not present

## 2022-08-24 DIAGNOSIS — G894 Chronic pain syndrome: Secondary | ICD-10-CM | POA: Diagnosis not present

## 2022-08-24 DIAGNOSIS — M79674 Pain in right toe(s): Secondary | ICD-10-CM | POA: Diagnosis not present

## 2022-09-01 DIAGNOSIS — I739 Peripheral vascular disease, unspecified: Secondary | ICD-10-CM | POA: Diagnosis not present

## 2022-09-02 ENCOUNTER — Telehealth: Payer: Self-pay

## 2022-09-02 NOTE — Telephone Encounter (Signed)
Copied from CRM [id number]. Topic: General - Other >> Sep 02, 2022 11:00 AM Turkey B wrote: Reason for CRM: pt called in unhappy with the referral for Podiatrist, Dr Rosetta Posner, says he was very rough with his feet and didn't apologize or care that he was hurting, and he is going to cancel the appt for next week. He is going to find another referral and mentioned [facility].

## 2022-09-07 ENCOUNTER — Ambulatory Visit: Payer: Self-pay

## 2022-09-07 NOTE — Telephone Encounter (Signed)
Message from Allen Kell sent at 09/07/2022 12:19 PM EDT  Summary: Sore spot on pt backside   Pt states that he is still having issues with a sore spot on his backside in the crease. Pt states that it is very sore and is wanting to know what to do, all he can think of is to put neosporin on it but cannot reach it. Please advise.          Chief Complaint: soreness at gluteal cleft Symptoms: moderate pain  Frequency: 2 weeks  Pertinent Negatives: Patient denies fever Disposition: [] ED /[] Urgent Care (no appt availability in office) / [x] Appointment(In office/virtual)/ []  [facility]/ [] Home Care/ [] Refused Recommended Disposition /[] [facility]/ []  Follow-up with PCP Additional Notes: pt h/o pilonidal cyst to that area No openings with PCP Pt has appt at [facility] tomorrow.    Reason for Disposition  [1] Boil AND [2] not improved > 3 days following Care Advice  Answer Assessment - Initial Assessment Questions 1. APPEARANCE of BOIL: "What does the boil look like?"      Unable to see  2. LOCATION: "Where is the boil located?"      Cleft  3. NUMBER: "How many boils are there?"      1 4. SIZE: "How big is the boil?" (e.g., inches, cm; compare to size of a coin or other object)     Pt unable to see area due to location  5. ONSET: "When did the boil start?"     2 weeks  6. PAIN: "Is there any pain?" If Yes, ask: "How bad is the pain?"   (Scale 1-10; or mild, moderate, severe)     Yes - moderate 7. FEVER: "Do you have a fever?" If Yes, ask: "What is it, how was it measured, and when did it start?"      no 8. SOURCE: "Have you been around anyone with boils or other Staph infections?" "Have you ever had boils before?"     N/a 9. OTHER SYMPTOMS: "Do you have any other symptoms?" (e.g., shaking chills, weakness, rash elsewhere on body)     none 10. PREGNANCY: "Is there any chance you are pregnant?" "When was your last menstrual period?"       N/a  Protocols used:  Boil (Skin Abscess)-A-AH

## 2022-09-08 DIAGNOSIS — Z7982 Long term (current) use of aspirin: Secondary | ICD-10-CM | POA: Diagnosis not present

## 2022-09-08 DIAGNOSIS — D72829 Elevated white blood cell count, unspecified: Secondary | ICD-10-CM | POA: Diagnosis not present

## 2022-09-08 DIAGNOSIS — Z87891 Personal history of nicotine dependence: Secondary | ICD-10-CM | POA: Diagnosis not present

## 2022-09-08 DIAGNOSIS — D75839 Thrombocytosis, unspecified: Secondary | ICD-10-CM | POA: Diagnosis not present

## 2022-09-08 DIAGNOSIS — Z1589 Genetic susceptibility to other disease: Secondary | ICD-10-CM | POA: Diagnosis not present

## 2022-09-08 DIAGNOSIS — I10 Essential (primary) hypertension: Secondary | ICD-10-CM | POA: Diagnosis not present

## 2022-09-08 DIAGNOSIS — D45 Polycythemia vera: Secondary | ICD-10-CM | POA: Diagnosis not present

## 2022-09-09 ENCOUNTER — Ambulatory Visit (INDEPENDENT_AMBULATORY_CARE_PROVIDER_SITE_OTHER): Payer: Medicare PPO | Admitting: Family Medicine

## 2022-09-09 ENCOUNTER — Encounter: Payer: Self-pay | Admitting: Family Medicine

## 2022-09-09 VITALS — BP 147/88 | HR 80 | Temp 97.7°F | Ht 70.0 in | Wt 150.0 lb

## 2022-09-09 DIAGNOSIS — S31809A Unspecified open wound of unspecified buttock, initial encounter: Secondary | ICD-10-CM | POA: Insufficient documentation

## 2022-09-09 DIAGNOSIS — I10 Essential (primary) hypertension: Secondary | ICD-10-CM | POA: Diagnosis not present

## 2022-09-09 DIAGNOSIS — S31809D Unspecified open wound of unspecified buttock, subsequent encounter: Secondary | ICD-10-CM | POA: Diagnosis not present

## 2022-09-09 DIAGNOSIS — S31809S Unspecified open wound of unspecified buttock, sequela: Secondary | ICD-10-CM

## 2022-09-09 NOTE — Progress Notes (Signed)
Established patient visit   Patient: Jerry Mcpherson   DOB: [birth year]   82 y.o. Male  MRN: [id number] Visit Date: 09/09/2022  Today's healthcare provider: Jacky Kindle, FNP  Re Introduced to nurse practitioner role and practice setting.  All questions answered.  Discussed provider/patient relationship and expectations.  No chief complaint on file.  Subjective    HPI HPI   Pt stated---possible wound tail bone area--painful w/ pressure. 3 weeks Last edited by Shelly Bombard, CMA on 09/09/2022  9:46 AM.      Medications: Outpatient Medications Prior to Visit  Medication Sig   amLODipine (NORVASC) 2.5 MG tablet TAKE ONE (1) TABLET BY MOUTH ONCE DAILY   Ascorbic Acid (VITAMIN C) 1000 MG tablet Take 2 tablets by mouth daily.   aspirin 81 MG tablet Take 2 tablets daily   budesonide-formoterol (SYMBICORT) 160-4.5 MCG/ACT inhaler Inhale 2 puffs into the lungs 2 (two) times daily.   clopidogrel (PLAVIX) 75 MG tablet Take 75 mg by mouth daily.   cyproheptadine (PERIACTIN) 4 MG tablet TAKE 1/2-1 TABLET BY MOUTH THREE TIMES DAILY AS NEEDED TO HELP APPETITE   diclofenac Sodium (VOLTAREN) 1 % GEL Apply 4 g topically 4 (four) times daily as needed.   docusate sodium (COLACE) 100 MG capsule Take 1 capsule by mouth daily as needed.   ELIQUIS 5 MG TABS tablet Take 1 tablet (5 mg total) by mouth 2 (two) times daily.   fluticasone (FLONASE) 50 MCG/ACT nasal spray Place 2 sprays into both nostrils daily.   hydrochlorothiazide (HYDRODIURIL) 25 MG tablet Take 1 tablet (25 mg total) by mouth daily.   hydroxyurea (HYDREA) 500 MG capsule Take 500 mg by mouth as directed. 2 tablets 3 days a week, and 1 tablet all other days   levothyroxine (SYNTHROID) 75 MCG tablet TAKE 1 TABLET BY MOUTH EVERY DAY   metoprolol succinate (TOPROL-XL) 50 MG 24 hr tablet Take 1 tablet (50 mg total) by mouth daily.   Multiple Vitamins-Minerals (MULTIVITAMIN ADULT PO) Take 1 tablet by mouth daily.   mupirocin cream  (BACTROBAN) 2 % Apply 1 application  topically 2 (two) times daily.   naproxen (NAPROSYN) 500 MG tablet TAKE 1 TABLET(500 MG) BY MOUTH TWICE DAILY WITH A MEAL   oxyCODONE-acetaminophen (PERCOCET) 10-325 MG tablet Take 1 tablet by mouth every 6 (six) hours as needed for pain.   potassium chloride (KLOR-CON) 10 MEQ tablet Take by mouth.   promethazine (PHENERGAN) 12.5 MG tablet TAKE 1 TO 2 TABLETS(12.5 TO 25 MG) BY MOUTH EVERY 8 HOURS AS NEEDED FOR NAUSEA OR VOMITING   sildenafil (VIAGRA) 50 MG tablet Take 0.5-1 tablets by mouth daily as needed.   valACYclovir (VALTREX) 1000 MG tablet TAKE 1 TABLET BY MOUTH TWICE A DAY AS NEEDED   amoxicillin-clavulanate (AUGMENTIN) 875-125 MG tablet TAKE 1 TABLET BY MOUTH TWICE A DAY FOR 10 DAYS (Patient not taking: Reported on 09/09/2022)   No facility-administered medications prior to visit.    Review of Systems    Objective    BP (!) 147/88   Pulse 80   Temp 97.7 F (36.5 C)   Ht 5\' 10"  (1.778 m)   Wt 150 lb (68 kg)   SpO2 100%   BMI 21.52 kg/m   Physical Exam Vitals and nursing note reviewed.  Constitutional:      Appearance: Normal appearance. He is normal weight.  HENT:     Head: Normocephalic and atraumatic.  Cardiovascular:     Rate and  Rhythm: Normal rate and regular rhythm.     Pulses: Normal pulses.     Heart sounds: Normal heart sounds.  Pulmonary:     Effort: Pulmonary effort is normal.     Breath sounds: Normal breath sounds.  Musculoskeletal:        General: Normal range of motion.     Cervical back: Normal range of motion.  Skin:    General: Skin is warm and dry.     Capillary Refill: Capillary refill takes less than 2 seconds.          Comments: L foot wound; off loading boot with dressing in place   Neurological:     General: No focal deficit present.     Mental Status: He is alert and oriented to person, place, and time. Mental status is at baseline.  Psychiatric:        Mood and Affect: Mood normal.         Behavior: Behavior normal.        Thought Content: Thought content normal.        Judgment: Judgment normal.     No results found for any visits on 09/09/22.  Assessment & Plan     Problem List Items Addressed This Visit       Cardiovascular and Mediastinum   Primary hypertension    Chronic, elevated Patient believes he was taken off of his norvasc 2.5 mg Unclear of what he was started on in place Unclear based on chart review Previously noted higher bps up to 170s Did not take medication today d/t office visit Goal <140/<89 Previously on norvasc 2.5 mg hctz 25 mg and metop xl 50 mg         Other   Wound of gluteal cleft - Primary    Small, scabbed lesions Pt endorses pain Hx of HSV; however, remains on valtex 1000 mg BID PRN Defer changes to POC at this time Continue to keep site dry and use zinc based cream to assist Skin without infection or erythema  Continue to monitor Encourage frequent repositioning when sitting given weight loss from bladder cancer hx       Return if symptoms worsen or fail to improve.     Leilani Merl, FNP, have reviewed all documentation for this visit. The documentation on 09/09/22 for the exam, diagnosis, procedures, and orders are all accurate and complete.  Jacky Kindle, FNP  [facility] [phone] (phone) [phone] (fax)  [facility]

## 2022-09-09 NOTE — Assessment & Plan Note (Signed)
Small, scabbed lesions Pt endorses pain Hx of HSV; however, remains on valtex 1000 mg BID PRN Defer changes to POC at this time Continue to keep site dry and use zinc based cream to assist Skin without infection or erythema  Continue to monitor Encourage frequent repositioning when sitting given weight loss from bladder cancer hx

## 2022-09-09 NOTE — Assessment & Plan Note (Signed)
Chronic, elevated Patient believes he was taken off of his norvasc 2.5 mg Unclear of what he was started on in place Unclear based on chart review Previously noted higher bps up to 170s Did not take medication today d/t office visit Goal <140/<89 Previously on norvasc 2.5 mg hctz 25 mg and metop xl 50 mg

## 2022-09-09 NOTE — Patient Instructions (Addendum)
Recommend Zinc based cream to top of gluteal cleft; ensure area is kept dry.  Please double check blood pressure medications; let Dr Sherrie Mustache know of another medication used in place of amlodipine

## 2022-09-14 DIAGNOSIS — M79672 Pain in left foot: Secondary | ICD-10-CM | POA: Diagnosis not present

## 2022-09-14 DIAGNOSIS — M7732 Calcaneal spur, left foot: Secondary | ICD-10-CM | POA: Diagnosis not present

## 2022-09-14 DIAGNOSIS — L97521 Non-pressure chronic ulcer of other part of left foot limited to breakdown of skin: Secondary | ICD-10-CM | POA: Diagnosis not present

## 2022-09-15 ENCOUNTER — Telehealth: Payer: Self-pay

## 2022-09-15 ENCOUNTER — Other Ambulatory Visit: Payer: Self-pay | Admitting: Family Medicine

## 2022-09-15 DIAGNOSIS — I10 Essential (primary) hypertension: Secondary | ICD-10-CM

## 2022-09-15 NOTE — Telephone Encounter (Signed)
Copied from CRM [id number]. Topic: General - Inquiry >> Sep 15, 2022  1:37 PM Clide Dales wrote: Patient received a text from [facility] saying that he needs to get his Shingles shot. Patient would like to know if his pcp recommends that he get it. Please advise.

## 2022-09-15 NOTE — Telephone Encounter (Signed)
Requested medication (s) are due for refill today:   Yes  Requested medication (s) are on the active medication list:   Yes  Future visit scheduled:   No    Last ordered: 10/11/2020 #90, 4 refills  Returned because labs are due and rx is from 2022   Requested Prescriptions  Pending Prescriptions Disp Refills   hydrochlorothiazide (HYDRODIURIL) 25 MG tablet [Pharmacy Med Name: HYDROCHLOROTHIAZIDE 25 MG TAB] 90 tablet 1    Sig: TAKE 1 TABLET BY MOUTH EVERY DAY     Cardiovascular: Diuretics - Thiazide Failed - 09/15/2022 10:59 AM      Failed - Cr in normal range and within 180 days    Creatinine  Date Value Ref Range Status  01/14/2022 1.9 (A) 0.6 - 1.3 Final   Creatinine, Ser  Date Value Ref Range Status  01/23/2021 1.77 (H) 0.76 - 1.27 mg/dL Final         Failed - K in normal range and within 180 days    Potassium  Date Value Ref Range Status  01/14/2022 4.4 3.5 - 5.1 mEq/L Final         Failed - Na in normal range and within 180 days    Sodium  Date Value Ref Range Status  01/14/2022 141 137 - 147 Final         Failed - Last BP in normal range    BP Readings from Last 1 Encounters:  09/09/22 (!) 147/88         Passed - Valid encounter within last 6 months    Recent Outpatient Visits           6 days ago Wound of gluteal cleft, unspecified laterality, sequela   [place] [facility] Merita Norton T, FNP   3 weeks ago Cellulitis of toe of left foot   [place] [facility] [name], PA-C   2 months ago Cellulitis of toe of left foot   [facility] Malva Limes, MD   2 months ago Cellulitis of toe of left foot   [facility] Malva Limes, MD   7 months ago Hypothyroidism, unspecified type   [facility] Malva Limes, MD

## 2022-09-15 NOTE — Telephone Encounter (Signed)
yes

## 2022-09-16 DIAGNOSIS — I739 Peripheral vascular disease, unspecified: Secondary | ICD-10-CM | POA: Diagnosis not present

## 2022-09-16 DIAGNOSIS — M79672 Pain in left foot: Secondary | ICD-10-CM | POA: Diagnosis not present

## 2022-09-16 NOTE — Telephone Encounter (Signed)
Patient advised.

## 2022-09-20 ENCOUNTER — Other Ambulatory Visit: Payer: Self-pay | Admitting: Family Medicine

## 2022-09-20 DIAGNOSIS — M5410 Radiculopathy, site unspecified: Secondary | ICD-10-CM

## 2022-09-20 DIAGNOSIS — G589 Mononeuropathy, unspecified: Secondary | ICD-10-CM

## 2022-09-20 NOTE — Telephone Encounter (Signed)
Medication Refill - Medication: oxyCODONE-acetaminophen (PERCOCET) 10-325 MG tablet [[id number]]   Has the patient contacted their pharmacy? Yes.    (Agent: If yes, when and what did the pharmacy advise?) Contact PCP   Preferred Pharmacy (with phone number or street name): [facility] [place],  - [address]   Has the patient been seen for an appointment in the last year OR does the patient have an upcoming appointment? Yes.    Agent: Please be advised that RX refills may take up to 3 business days. We ask that you follow-up with your pharmacy.

## 2022-09-21 NOTE — Telephone Encounter (Signed)
Requested medications are due for refill today.  yes  Requested medications are on the active medications list.  yes  Last refill. 08/20/2022 #120 0 rf  Future visit scheduled.   no  Notes to clinic.  Refill not delegated.    Requested Prescriptions  Pending Prescriptions Disp Refills   oxyCODONE-acetaminophen (PERCOCET) 10-325 MG tablet 120 tablet 0    Sig: Take 1 tablet by mouth every 6 (six) hours as needed for pain.     Not Delegated - Analgesics:  Opioid Agonist Combinations Failed - 09/20/2022  2:15 PM      Failed - This refill cannot be delegated      Failed - Urine Drug Screen completed in last 360 days      Passed - Valid encounter within last 3 months    Recent Outpatient Visits           1 week ago Wound of gluteal cleft, unspecified laterality, sequela   [place] [facility] Merita Norton T, FNP   1 month ago Cellulitis of toe of left foot   [place] [facility] [name], PA-C   2 months ago Cellulitis of toe of left foot   [facility] Malva Limes, MD   2 months ago Cellulitis of toe of left foot   [facility] Malva Limes, MD   7 months ago Hypothyroidism, unspecified type   [facility] Malva Limes, MD

## 2022-09-22 MED ORDER — OXYCODONE-ACETAMINOPHEN 10-325 MG PO TABS
1.00 | ORAL_TABLET | Freq: Four times a day (QID) | ORAL | 0 refills | Status: DC | PRN
Start: 2022-09-22 — End: 2022-10-18

## 2022-09-27 DIAGNOSIS — I739 Peripheral vascular disease, unspecified: Secondary | ICD-10-CM | POA: Diagnosis not present

## 2022-09-27 DIAGNOSIS — M216X2 Other acquired deformities of left foot: Secondary | ICD-10-CM | POA: Diagnosis not present

## 2022-09-27 DIAGNOSIS — L97521 Non-pressure chronic ulcer of other part of left foot limited to breakdown of skin: Secondary | ICD-10-CM | POA: Diagnosis not present

## 2022-09-30 DIAGNOSIS — Z959 Presence of cardiac and vascular implant and graft, unspecified: Secondary | ICD-10-CM | POA: Diagnosis not present

## 2022-09-30 DIAGNOSIS — M79672 Pain in left foot: Secondary | ICD-10-CM | POA: Diagnosis not present

## 2022-09-30 DIAGNOSIS — I739 Peripheral vascular disease, unspecified: Secondary | ICD-10-CM | POA: Diagnosis not present

## 2022-09-30 DIAGNOSIS — L97521 Non-pressure chronic ulcer of other part of left foot limited to breakdown of skin: Secondary | ICD-10-CM | POA: Diagnosis not present

## 2022-10-11 DIAGNOSIS — C679 Malignant neoplasm of bladder, unspecified: Secondary | ICD-10-CM | POA: Diagnosis not present

## 2022-10-11 DIAGNOSIS — Z936 Other artificial openings of urinary tract status: Secondary | ICD-10-CM | POA: Diagnosis not present

## 2022-10-12 DIAGNOSIS — M216X2 Other acquired deformities of left foot: Secondary | ICD-10-CM | POA: Diagnosis not present

## 2022-10-12 DIAGNOSIS — L97521 Non-pressure chronic ulcer of other part of left foot limited to breakdown of skin: Secondary | ICD-10-CM | POA: Diagnosis not present

## 2022-10-12 DIAGNOSIS — I739 Peripheral vascular disease, unspecified: Secondary | ICD-10-CM | POA: Diagnosis not present

## 2022-10-18 ENCOUNTER — Other Ambulatory Visit: Payer: Self-pay | Admitting: Family Medicine

## 2022-10-18 DIAGNOSIS — M5410 Radiculopathy, site unspecified: Secondary | ICD-10-CM

## 2022-10-18 DIAGNOSIS — G589 Mononeuropathy, unspecified: Secondary | ICD-10-CM

## 2022-10-18 NOTE — Telephone Encounter (Signed)
Medication Refill - Medication:  oxyCODONE-acetaminophen (PERCOCET) 10-325 MG tablet   Pt states that his medication is due for refill on Friday. Pt would like his medication on Friday or the day before.   Has the patient contacted their pharmacy? No.  Preferred Pharmacy (with phone number or street name): [facility] [place], [place] - [address]  Phone: [phone] Fax: [phone]  Has the patient been seen for an appointment in the last year OR does the patient have an upcoming appointment? Yes.    Agent: Please be advised that RX refills may take up to 3 business days. We ask that you follow-up with your pharmacy.

## 2022-10-19 MED ORDER — OXYCODONE-ACETAMINOPHEN 10-325 MG PO TABS
1.0000 | ORAL_TABLET | Freq: Four times a day (QID) | ORAL | 0 refills | Status: DC | PRN
Start: 2022-10-19 — End: 2022-11-17

## 2022-10-19 NOTE — Telephone Encounter (Signed)
Requested medication (s) are due for refill today: yes  Requested medication (s) are on the active medication list: yes  Last refill:  09/22/22  Future visit scheduled: yes  Notes to clinic:  Unable to refill per protocol, cannot delegate.      Requested Prescriptions  Pending Prescriptions Disp Refills   oxyCODONE-acetaminophen (PERCOCET) 10-325 MG tablet 120 tablet 0    Sig: Take 1 tablet by mouth every 6 (six) hours as needed for pain.     Not Delegated - Analgesics:  Opioid Agonist Combinations Failed - 10/18/2022  3:40 PM      Failed - This refill cannot be delegated      Failed - Urine Drug Screen completed in last 360 days      Passed - Valid encounter within last 3 months    Recent Outpatient Visits           1 month ago Wound of gluteal cleft, unspecified laterality, sequela   [place] [facility] Merita Norton T, FNP   2 months ago Cellulitis of toe of left foot   [place] [facility] [name], PA-C   3 months ago Cellulitis of toe of left foot   [place] [facility] Malva Limes, MD   3 months ago Cellulitis of toe of left foot   [place] [facility] Malva Limes, MD   8 months ago Hypothyroidism, unspecified type   [facility] Malva Limes, MD       Future Appointments             In 2 weeks Fisher, Demetrios Isaacs, MD [facility]

## 2022-11-08 ENCOUNTER — Ambulatory Visit: Payer: Medicare PPO | Admitting: Family Medicine

## 2022-11-11 ENCOUNTER — Telehealth: Payer: Self-pay | Admitting: *Deleted

## 2022-11-11 NOTE — Progress Notes (Signed)
  Care Coordination   Note   11/11/2022 Name: Jerry Mcpherson MRN: [id number] DOB: Apr 12, 1941  Jerry Mcpherson is a 82 y.o. year old male who sees Fisher, Demetrios Isaacs, MD for primary care. I reached out to Willa Rough Kludt by phone today to offer care coordination services.  [name] was given information about Care Coordination services today including:   The Care Coordination services include support from the care team which includes your Nurse Coordinator, Clinical Social Worker, or Pharmacist.  The Care Coordination team is here to help remove barriers to the health concerns and goals most important to you. Care Coordination services are voluntary, and the patient may decline or stop services at any time by request to their care team member.   Care Coordination Consent Status: Patient agreed to services and verbal consent obtained.   Follow up plan:  Telephone appointment with care coordination team member scheduled for:  11/16/2022   Encounter Outcome:  Pt. Scheduled from referral - offered sooner appt for 8/2 - pt will be at [facility] until 8/6   Burman Nieves, [facility] Care Coordination Care Guide Direct Dial: [phone]

## 2022-11-12 DIAGNOSIS — C678 Malignant neoplasm of overlapping sites of bladder: Secondary | ICD-10-CM | POA: Diagnosis not present

## 2022-11-12 DIAGNOSIS — C679 Malignant neoplasm of bladder, unspecified: Secondary | ICD-10-CM | POA: Diagnosis not present

## 2022-11-15 DIAGNOSIS — Z436 Encounter for attention to other artificial openings of urinary tract: Secondary | ICD-10-CM | POA: Diagnosis not present

## 2022-11-15 DIAGNOSIS — C678 Malignant neoplasm of overlapping sites of bladder: Secondary | ICD-10-CM | POA: Diagnosis not present

## 2022-11-15 DIAGNOSIS — C679 Malignant neoplasm of bladder, unspecified: Secondary | ICD-10-CM | POA: Diagnosis not present

## 2022-11-15 DIAGNOSIS — Z8551 Personal history of malignant neoplasm of bladder: Secondary | ICD-10-CM | POA: Diagnosis not present

## 2022-11-15 DIAGNOSIS — Z87891 Personal history of nicotine dependence: Secondary | ICD-10-CM | POA: Diagnosis not present

## 2022-11-15 DIAGNOSIS — Z5900 Homelessness unspecified: Secondary | ICD-10-CM | POA: Diagnosis not present

## 2022-11-16 ENCOUNTER — Ambulatory Visit: Payer: Self-pay

## 2022-11-16 NOTE — Patient Instructions (Signed)
Visit Information  Thank you for taking time to visit with me today. Please don't hesitate to contact me if I can be of assistance to you.   Following are the goals we discussed today:   Goals Addressed             This Visit's Progress    Care Coordination Services       Care Coordination Interventions: Housing-Patient has been homeless and living in his car.  Family and friends are not able to help.  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  Patient is not interested in housing options with requirements to be inside by a certain time because he tries to work sometimes.  Patient is not interested in [employer].  Patient is looking for rent range of $200-$300.  Sw educated patient on the housing crisis and rent that low may be a challenge outside of [employer]  SW does suggest patient consider Public Housing to contact for an application, but explained there will be a waiting period.  Patients income is [phone] but he has not been able to save toward moving.  He has 5 bills to pay.  SW will obtain a list of income based housing options and review at the next appointment.  Call was ended earlier than anticipated due to patient location dropping the call.             Our next appointment is by telephone on 11/19/22 at 3:00pm  Please call the care guide team at [phone] if you need to cancel or reschedule your appointment.   If you are experiencing a Mental Health or Behavioral Health Crisis or need someone to talk to, please call 911  Patient verbalizes understanding of instructions and care plan provided today and agrees to view in MyChart. Active MyChart status and patient understanding of how to access instructions and care plan via MyChart confirmed with patient.     No further follow up required: 11/19/22 at 3pm. Lysle Morales, BSW Social Worker [facility] Care Management  [phone]

## 2022-11-16 NOTE — Patient Outreach (Signed)
  Care Coordination   Initial Visit Note   11/16/2022 Name: Jerry Mcpherson MRN: [id number] DOB: 07-Jun-1940  Jerry Mcpherson is a 82 y.o. year old male who sees Jerry Mcpherson, Jerry Isaacs, Jerry Mcpherson for primary care. I spoke with  Jerry Mcpherson by phone today.  What matters to the patients health and wellness today?  Patient is homeless and needs housing options.    Goals Addressed             This Visit's Progress    Care Coordination Services       Care Coordination Interventions: Housing-Patient has been homeless and living in his car.  Family and friends are not able to help.  About 1/5 yrs ago he worked with [employer] and [facility] for housing but the options were not what he wanted.  Patient is not interested in housing options with requirements to be inside by a certain time because he tries to work sometimes.  Patient is not interested in [employer].  Patient is looking for rent range of $200-$300.  Sw educated patient on the housing crisis and rent that low may be a challenge outside of [employer]  SW does suggest patient consider Public Housing to contact for an application, but explained there will be a waiting period.  Patients income is [phone] but he has not been able to save toward moving.  He has 5 bills to pay.  SW will obtain a list of income based housing options and review at the next appointment.  Call was ended earlier than anticipated due to patient location dropping the call.             SDOH assessments and interventions completed:  Yes  SDOH Interventions Today    Flowsheet Row Most Recent Value  SDOH Interventions   Food Insecurity Interventions Intervention Not Indicated, Other (Comment)  [Uses SSA Benefits]  Housing Interventions Other (Comment)  [Looking for housing options]  Transportation Interventions --  [Has a vehicle]  Utilities Interventions Intervention Not Indicated  [Homeless]        Care Coordination Interventions:  Yes,  provided  Interventions Today    Flowsheet Row Most Recent Value  General Interventions   General Interventions Discussed/Reviewed General Interventions Discussed, Community Resources  [employer] for shelter, will consider public housing and income based housing]        Follow up plan: Follow up call scheduled for 11/19/22 at 3pm    Encounter Outcome:  Pt. Visit Completed

## 2022-11-17 ENCOUNTER — Ambulatory Visit: Payer: Self-pay

## 2022-11-17 ENCOUNTER — Other Ambulatory Visit: Payer: Self-pay | Admitting: Family Medicine

## 2022-11-17 DIAGNOSIS — G589 Mononeuropathy, unspecified: Secondary | ICD-10-CM

## 2022-11-17 DIAGNOSIS — M5410 Radiculopathy, site unspecified: Secondary | ICD-10-CM

## 2022-11-17 MED ORDER — OXYCODONE-ACETAMINOPHEN 10-325 MG PO TABS
1.0000 | ORAL_TABLET | Freq: Four times a day (QID) | ORAL | 0 refills | Status: DC | PRN
Start: 2022-11-17 — End: 2022-12-20

## 2022-11-17 NOTE — Patient Outreach (Signed)
  Care Coordination   Follow Up Visit Note   11/17/2022 Name: Alice Badenhop MRN: [id number] DOB: Sep 22, 1940  Jahmeer Dossey Angert is a 82 y.o. year old male who sees Fisher, Demetrios Isaacs, MD for primary care. I spoke with  Willa Rough Chausse by phone today.  What matters to the patients health and wellness today?  Sw researched housing options and found 5 properties that are income based in [place].  Patient is interested in the options and is grateful for SW finding resources.  Patient is not able to locate paper and pen to take down information.  Agreed to call SW back once he had obtain something to write with.     Goals Addressed   None     SDOH assessments and interventions completed:  No     Care Coordination Interventions:  Yes, provided  Interventions Today    Flowsheet Row Most Recent Value  General Interventions   General Interventions Discussed/Reviewed General Interventions Reviewed, Community Resources  [SW has obtained 5 rental properties for patient to reivew and apply]        Follow up plan:  Patient will call back once he has been able to find something to write on and with.    Encounter Outcome:  Pt. Visit Completed

## 2022-11-17 NOTE — Patient Instructions (Signed)
Visit Information  Thank you for taking time to visit with me today. Please don't hesitate to contact me if I can be of assistance to you.   Following are the goals we discussed today:   Goals Addressed   None     If you are experiencing a Mental Health or Behavioral Health Crisis or need someone to talk to, please call 911  Patient verbalizes understanding of instructions and care plan provided today and agrees to view in MyChart. Active MyChart status and patient understanding of how to access instructions and care plan via MyChart confirmed with patient.     The patient will call SW* as advised to need for services.   Lysle Morales, BSW Social Worker [facility] Care Management  [phone]

## 2022-11-17 NOTE — Telephone Encounter (Signed)
Pt called to request for his medicine because he is afraid that the inclement weather will delay his refill access. He is asking for   Medication Refill - Medication: oxyCODONE-acetaminophen (PERCOCET) 10-325 MG tablet   Has the patient contacted their pharmacy? Yes.   (Agent: If no, request that the patient contact the pharmacy for the refill. If patient does not wish to contact the pharmacy document the reason why and proceed with request.) (Agent: If yes, when and what did the pharmacy advise?)  Preferred Pharmacy (with phone number or street name):  [facility] [place] [place] - [address]  [address] [place] [address]  Phone: [phone] Fax: [phone]   Has the patient been seen for an appointment in the last year OR does the patient have an upcoming appointment? Yes.    Agent: Please be advised that RX refills may take up to 3 business days. We ask that you follow-up with your pharmacy.

## 2022-11-17 NOTE — Telephone Encounter (Signed)
Requested medication (s) are due for refill today: Yes  Requested medication (s) are on the active medication list: Yes  Last refill:  10/19/22  Future visit scheduled: Yes  Notes to clinic:  Unable to refill per protocol, cannot delegate.      Requested Prescriptions  Pending Prescriptions Disp Refills   oxyCODONE-acetaminophen (PERCOCET) 10-325 MG tablet 120 tablet 0    Sig: Take 1 tablet by mouth every 6 (six) hours as needed for pain.     Not Delegated - Analgesics:  Opioid Agonist Combinations Failed - 11/17/2022 10:35 AM      Failed - This refill cannot be delegated      Failed - Urine Drug Screen completed in last 360 days      Passed - Valid encounter within last 3 months    Recent Outpatient Visits           2 months ago Wound of gluteal cleft, unspecified laterality, sequela   [facility] Merita Norton T, FNP   2 months ago Cellulitis of toe of left foot   [facility] [name], PA-C   4 months ago Cellulitis of toe of left foot   [facility] Malva Limes, MD   4 months ago Cellulitis of toe of left foot   [facility] Malva Limes, MD   9 months ago Hypothyroidism, unspecified type   [facility] Malva Limes, MD       Future Appointments             In 2 days Fisher, Demetrios Isaacs, MD [facility]

## 2022-11-18 DIAGNOSIS — Z936 Other artificial openings of urinary tract status: Secondary | ICD-10-CM | POA: Diagnosis not present

## 2022-11-18 DIAGNOSIS — C679 Malignant neoplasm of bladder, unspecified: Secondary | ICD-10-CM | POA: Diagnosis not present

## 2022-11-19 ENCOUNTER — Ambulatory Visit (INDEPENDENT_AMBULATORY_CARE_PROVIDER_SITE_OTHER): Payer: Medicare PPO | Admitting: Family Medicine

## 2022-11-19 VITALS — BP 156/83 | HR 75 | Temp 97.9°F | Ht 70.0 in | Wt 154.0 lb

## 2022-11-19 DIAGNOSIS — S31809A Unspecified open wound of unspecified buttock, initial encounter: Secondary | ICD-10-CM

## 2022-11-19 MED ORDER — MUPIROCIN CALCIUM 2 % EX CREA
1.0000 | TOPICAL_CREAM | Freq: Two times a day (BID) | CUTANEOUS | 5 refills | Status: DC
Start: 2022-11-19 — End: 2023-10-19

## 2022-11-19 NOTE — Progress Notes (Signed)
Established patient visit   Patient: Jerry Mcpherson   DOB: [birth year]   82 y.o. Male  MRN: [id number] Visit Date: 11/19/2022  Today's healthcare provider: Mila Merry, MD   Chief Complaint  Patient presents with   Wound Check    Patient states he has a wound on his back.  He states he has been seen for this before but there hasn't been any treatment to get rid of it.  He denies any drainage and says the only problem is when he sits it hurts him because of the location.    Subjective    HPI HPI     Wound Check    Additional comments: Patient states he has a wound on his back.  He states he has been seen for this before but there hasn't been any treatment to get rid of it.  He denies any drainage and says the only problem is when he sits it hurts him because of the location.       Last edited by Adline Peals, CMA on 11/19/2022  1:10 PM.      Had similar lesion seen here June 2023 which was treated successfully with mupirocin, but sore keeps coming back.   Medications: Outpatient Medications Prior to Visit  Medication Sig   amLODipine (NORVASC) 2.5 MG tablet TAKE ONE (1) TABLET BY MOUTH ONCE DAILY   Ascorbic Acid (VITAMIN C) 1000 MG tablet Take 2 tablets by mouth daily.   aspirin 81 MG tablet Take 2 tablets daily   budesonide-formoterol (SYMBICORT) 160-4.5 MCG/ACT inhaler Inhale 2 puffs into the lungs 2 (two) times daily.   clopidogrel (PLAVIX) 75 MG tablet Take 75 mg by mouth daily.   cyproheptadine (PERIACTIN) 4 MG tablet TAKE 1/2-1 TABLET BY MOUTH THREE TIMES DAILY AS NEEDED TO HELP APPETITE   diclofenac Sodium (VOLTAREN) 1 % GEL Apply 4 g topically 4 (four) times daily as needed.   docusate sodium (COLACE) 100 MG capsule Take 1 capsule by mouth daily as needed.   ELIQUIS 5 MG TABS tablet Take 1 tablet (5 mg total) by mouth 2 (two) times daily.   fluticasone (FLONASE) 50 MCG/ACT nasal spray Place 2 sprays into both nostrils daily.   hydrochlorothiazide  (HYDRODIURIL) 25 MG tablet TAKE 1 TABLET BY MOUTH EVERY DAY   hydroxyurea (HYDREA) 500 MG capsule Take 500 mg by mouth as directed. 2 tablets 3 days a week, and 1 tablet all other days   levothyroxine (SYNTHROID) 75 MCG tablet TAKE 1 TABLET BY MOUTH EVERY DAY   metoprolol succinate (TOPROL-XL) 50 MG 24 hr tablet Take 1 tablet (50 mg total) by mouth daily.   Multiple Vitamins-Minerals (MULTIVITAMIN ADULT PO) Take 1 tablet by mouth daily.   naproxen (NAPROSYN) 500 MG tablet TAKE 1 TABLET(500 MG) BY MOUTH TWICE DAILY WITH A MEAL   oxyCODONE-acetaminophen (PERCOCET) 10-325 MG tablet Take 1 tablet by mouth every 6 (six) hours as needed for pain.   potassium chloride (KLOR-CON) 10 MEQ tablet Take by mouth.   promethazine (PHENERGAN) 12.5 MG tablet TAKE 1 TO 2 TABLETS(12.5 TO 25 MG) BY MOUTH EVERY 8 HOURS AS NEEDED FOR NAUSEA OR VOMITING   sildenafil (VIAGRA) 50 MG tablet Take 0.5-1 tablets by mouth daily as needed.   valACYclovir (VALTREX) 1000 MG tablet TAKE 1 TABLET BY MOUTH TWICE A DAY AS NEEDED   [DISCONTINUED] amoxicillin-clavulanate (AUGMENTIN) 875-125 MG tablet TAKE 1 TABLET BY MOUTH TWICE A DAY FOR 10 DAYS (Patient not taking: Reported on  09/09/2022)   [DISCONTINUED] mupirocin cream (BACTROBAN) 2 % Apply 1 application  topically 2 (two) times daily.   No facility-administered medications prior to visit.       Objective    BP (!) 156/83 (BP Location: Left Arm, Patient Position: Sitting, Cuff Size: Normal)   Pulse 75   Temp 97.9 F (36.6 C) (Oral)   Ht 5\' 10"  (1.778 m)   Wt 154 lb (69.9 kg)   SpO2 100%   BMI 22.10 kg/m   Physical Exam  Dime sized tender shallow erosion over sacrum     Assessment & Plan     1. Wound of gluteal cleft, unspecified laterality, initial encounter suspect underlying pilonidal cyst.  - mupirocin cream (BACTROBAN) 2 %; Apply 1 Application topically 2 (two) times daily.  Dispense: 30 g; Refill: 5         Mila Merry, MD  [facility] [phone] (phone) [phone] (fax)  [facility]

## 2022-11-23 DIAGNOSIS — L97521 Non-pressure chronic ulcer of other part of left foot limited to breakdown of skin: Secondary | ICD-10-CM | POA: Diagnosis not present

## 2022-11-23 DIAGNOSIS — M216X2 Other acquired deformities of left foot: Secondary | ICD-10-CM | POA: Diagnosis not present

## 2022-11-23 DIAGNOSIS — I739 Peripheral vascular disease, unspecified: Secondary | ICD-10-CM | POA: Diagnosis not present

## 2022-11-26 NOTE — Patient Outreach (Addendum)
  Care Coordination   Initial Visit Note   11/26/2022  Late entry 11/16/22 Name: Jerry Mcpherson MRN: [id number] DOB: [birth year]  Jerry Mcpherson is a 82 y.o. year old male who sees Fisher, Demetrios Isaacs, MD for primary care. I spoke with  Willa Rough Vlachos by phone today.  What matters to the patients health and wellness today?  Patient needs help with housing.  He has been discharged from [facility] and is currently driving.  Request to call back as he can not hear well due to location.   Goals Addressed   None     SDOH assessments and interventions completed:  No     Care Coordination Interventions:  No, not indicated   Follow up plan:  Patient to call back.    Encounter Outcome:  Pt. Visit Completed

## 2022-11-29 ENCOUNTER — Telehealth: Payer: Self-pay | Admitting: Family Medicine

## 2022-11-29 DIAGNOSIS — G8929 Other chronic pain: Secondary | ICD-10-CM

## 2022-11-29 NOTE — Telephone Encounter (Signed)
Not sure if it is PT or Orthopedic surgery

## 2022-11-29 NOTE — Telephone Encounter (Signed)
Referral Request - Has patient seen PCP for this complaint? Yes.   *If NO, is insurance requiring patient see PCP for this issue before PCP can refer them? Referral for which specialty: physical therapy Preferred provider/office: [facility] in [place]   [phone] Reason for referral: PT on knees Pt has been here before and they need a new referral to see them

## 2022-12-08 DIAGNOSIS — B351 Tinea unguium: Secondary | ICD-10-CM | POA: Diagnosis not present

## 2022-12-14 DIAGNOSIS — R600 Localized edema: Secondary | ICD-10-CM | POA: Diagnosis not present

## 2022-12-14 DIAGNOSIS — M79672 Pain in left foot: Secondary | ICD-10-CM | POA: Diagnosis not present

## 2022-12-14 DIAGNOSIS — I739 Peripheral vascular disease, unspecified: Secondary | ICD-10-CM | POA: Diagnosis not present

## 2022-12-14 DIAGNOSIS — Z959 Presence of cardiac and vascular implant and graft, unspecified: Secondary | ICD-10-CM | POA: Diagnosis not present

## 2022-12-16 DIAGNOSIS — I739 Peripheral vascular disease, unspecified: Secondary | ICD-10-CM | POA: Diagnosis not present

## 2022-12-16 DIAGNOSIS — R030 Elevated blood-pressure reading, without diagnosis of hypertension: Secondary | ICD-10-CM | POA: Diagnosis not present

## 2022-12-16 DIAGNOSIS — L602 Onychogryphosis: Secondary | ICD-10-CM | POA: Diagnosis not present

## 2022-12-20 ENCOUNTER — Telehealth: Payer: Self-pay | Admitting: Family Medicine

## 2022-12-20 ENCOUNTER — Other Ambulatory Visit: Payer: Self-pay | Admitting: Family Medicine

## 2022-12-20 DIAGNOSIS — M5410 Radiculopathy, site unspecified: Secondary | ICD-10-CM

## 2022-12-20 DIAGNOSIS — G589 Mononeuropathy, unspecified: Secondary | ICD-10-CM

## 2022-12-20 NOTE — Telephone Encounter (Signed)
Already routed to the office for provider review today in a separate refill encounter.

## 2022-12-20 NOTE — Telephone Encounter (Signed)
Medication Refill - Medication:  oxyCODONE-acetaminophen (PERCOCET) 10-325 MG tablet   Has the patient contacted their pharmacy? No. (He called [facility] earlier today and calling back today b/c he is out of this med and wants asap. Preferred Pharmacy (with phone number or street name): [facility] [place], [place] - [address]  Has the patient been seen for an appointment in the last year OR does the patient have an upcoming appointment? Yes.    Agent: Please be advised that RX refills may take up to 3 business days. We ask that you follow-up with your pharmacy.

## 2022-12-20 NOTE — Telephone Encounter (Signed)
Medication Refill - Medication: oxyCODONE-acetaminophen (PERCOCET) 10-325 MG tablet   Has the patient contacted their pharmacy? No.  Preferred Pharmacy (with phone number or street name):  [facility] - [place], [place] - [address] Phone: [phone]  Fax: [phone]     Has the patient been seen for an appointment in the last year OR does the patient have an upcoming appointment? Yes.    Agent: Please be advised that RX refills may take up to 3 business days. We ask that you follow-up with your pharmacy.

## 2022-12-21 DIAGNOSIS — I129 Hypertensive chronic kidney disease with stage 1 through stage 4 chronic kidney disease, or unspecified chronic kidney disease: Secondary | ICD-10-CM | POA: Diagnosis not present

## 2022-12-21 DIAGNOSIS — D45 Polycythemia vera: Secondary | ICD-10-CM | POA: Diagnosis not present

## 2022-12-21 DIAGNOSIS — Z7902 Long term (current) use of antithrombotics/antiplatelets: Secondary | ICD-10-CM | POA: Diagnosis not present

## 2022-12-21 DIAGNOSIS — N1832 Chronic kidney disease, stage 3b: Secondary | ICD-10-CM | POA: Diagnosis not present

## 2022-12-21 DIAGNOSIS — D75838 Other thrombocytosis: Secondary | ICD-10-CM | POA: Diagnosis not present

## 2022-12-21 DIAGNOSIS — Z7901 Long term (current) use of anticoagulants: Secondary | ICD-10-CM | POA: Diagnosis not present

## 2022-12-21 DIAGNOSIS — Z87891 Personal history of nicotine dependence: Secondary | ICD-10-CM | POA: Diagnosis not present

## 2022-12-21 MED ORDER — OXYCODONE-ACETAMINOPHEN 10-325 MG PO TABS
1.0000 | ORAL_TABLET | Freq: Four times a day (QID) | ORAL | 0 refills | Status: DC | PRN
Start: 2022-12-21 — End: 2023-01-17

## 2022-12-21 NOTE — Telephone Encounter (Signed)
Requested medication (s) are due for refill today: yes  Requested medication (s) are on the active medication list: yes    Last refill: 11/17/22  #120  0 refills  Future visit scheduled yes 08/10/23  Notes to clinic:Not delegated, please review. Thank you.  Requested Prescriptions  Pending Prescriptions Disp Refills   oxyCODONE-acetaminophen (PERCOCET) 10-325 MG tablet 120 tablet 0    Sig: Take 1 tablet by mouth every 6 (six) hours as needed for pain.     Not Delegated - Analgesics:  Opioid Agonist Combinations Failed - 12/20/2022 12:16 PM      Failed - This refill cannot be delegated      Failed - Urine Drug Screen completed in last 360 days      Passed - Valid encounter within last 3 months    Recent Outpatient Visits           1 month ago Wound of gluteal cleft, unspecified laterality, initial encounter   [facility] Malva Limes, MD   3 months ago Wound of gluteal cleft, unspecified laterality, sequela   [facility] Merita Norton T, FNP   4 months ago Cellulitis of toe of left foot   [facility] [name], PA-C   5 months ago Cellulitis of toe of left foot   [facility] Malva Limes, MD   5 months ago Cellulitis of toe of left foot   [facility] Malva Limes, MD

## 2022-12-21 NOTE — Telephone Encounter (Signed)
This medication was sent in a separate refill encounter on 12/20/22 to the office for the provider to approve. He approved and refilled today in that encounter.

## 2022-12-21 NOTE — Telephone Encounter (Signed)
Pt calling back to follow up on pain med request. Pt is going through alot and has to spend all day at [facility] today.  He is out of medication.

## 2022-12-22 DIAGNOSIS — I739 Peripheral vascular disease, unspecified: Secondary | ICD-10-CM | POA: Diagnosis not present

## 2022-12-22 DIAGNOSIS — Z959 Presence of cardiac and vascular implant and graft, unspecified: Secondary | ICD-10-CM | POA: Diagnosis not present

## 2022-12-22 DIAGNOSIS — L97521 Non-pressure chronic ulcer of other part of left foot limited to breakdown of skin: Secondary | ICD-10-CM | POA: Diagnosis not present

## 2022-12-24 DIAGNOSIS — L601 Onycholysis: Secondary | ICD-10-CM | POA: Diagnosis not present

## 2022-12-24 DIAGNOSIS — M79675 Pain in left toe(s): Secondary | ICD-10-CM | POA: Diagnosis not present

## 2022-12-24 DIAGNOSIS — B351 Tinea unguium: Secondary | ICD-10-CM | POA: Diagnosis not present

## 2022-12-24 DIAGNOSIS — M79674 Pain in right toe(s): Secondary | ICD-10-CM | POA: Diagnosis not present

## 2022-12-24 DIAGNOSIS — I739 Peripheral vascular disease, unspecified: Secondary | ICD-10-CM | POA: Diagnosis not present

## 2022-12-27 DIAGNOSIS — G8929 Other chronic pain: Secondary | ICD-10-CM | POA: Diagnosis not present

## 2022-12-27 DIAGNOSIS — M25562 Pain in left knee: Secondary | ICD-10-CM | POA: Diagnosis not present

## 2022-12-27 DIAGNOSIS — M25561 Pain in right knee: Secondary | ICD-10-CM | POA: Diagnosis not present

## 2023-01-04 DIAGNOSIS — M25562 Pain in left knee: Secondary | ICD-10-CM | POA: Diagnosis not present

## 2023-01-04 DIAGNOSIS — M25561 Pain in right knee: Secondary | ICD-10-CM | POA: Diagnosis not present

## 2023-01-04 DIAGNOSIS — G8929 Other chronic pain: Secondary | ICD-10-CM | POA: Diagnosis not present

## 2023-01-12 DIAGNOSIS — M25562 Pain in left knee: Secondary | ICD-10-CM | POA: Diagnosis not present

## 2023-01-12 DIAGNOSIS — G8929 Other chronic pain: Secondary | ICD-10-CM | POA: Diagnosis not present

## 2023-01-12 DIAGNOSIS — M25561 Pain in right knee: Secondary | ICD-10-CM | POA: Diagnosis not present

## 2023-01-17 ENCOUNTER — Other Ambulatory Visit: Payer: Self-pay | Admitting: Family Medicine

## 2023-01-17 DIAGNOSIS — G589 Mononeuropathy, unspecified: Secondary | ICD-10-CM

## 2023-01-17 DIAGNOSIS — M5410 Radiculopathy, site unspecified: Secondary | ICD-10-CM

## 2023-01-17 NOTE — Telephone Encounter (Signed)
Medication Refill - Medication: oxyCODONE-acetaminophen (PERCOCET) 10-325 MG tablet  Has the patient contacted their pharmacy? No.  Preferred Pharmacy (with phone number or street name):  [facility] - [place], [place] - [address] Phone:  [phone]  Fax:  [phone]     Has the patient been seen for an appointment in the last year OR does the patient have an upcoming appointment? Yes.    Agent: Please be advised that RX refills may take up to 3 business days. We ask that you follow-up with your pharmacy.  

## 2023-01-21 DIAGNOSIS — G8929 Other chronic pain: Secondary | ICD-10-CM | POA: Diagnosis not present

## 2023-01-21 DIAGNOSIS — M25561 Pain in right knee: Secondary | ICD-10-CM | POA: Diagnosis not present

## 2023-01-21 DIAGNOSIS — M25562 Pain in left knee: Secondary | ICD-10-CM | POA: Diagnosis not present

## 2023-01-25 DIAGNOSIS — N1832 Chronic kidney disease, stage 3b: Secondary | ICD-10-CM | POA: Diagnosis not present

## 2023-01-25 DIAGNOSIS — Z23 Encounter for immunization: Secondary | ICD-10-CM | POA: Diagnosis not present

## 2023-01-25 DIAGNOSIS — Z87891 Personal history of nicotine dependence: Secondary | ICD-10-CM | POA: Diagnosis not present

## 2023-01-25 DIAGNOSIS — D649 Anemia, unspecified: Secondary | ICD-10-CM | POA: Diagnosis not present

## 2023-01-25 DIAGNOSIS — D45 Polycythemia vera: Secondary | ICD-10-CM | POA: Diagnosis not present

## 2023-01-25 DIAGNOSIS — L989 Disorder of the skin and subcutaneous tissue, unspecified: Secondary | ICD-10-CM | POA: Diagnosis not present

## 2023-01-25 DIAGNOSIS — Z86718 Personal history of other venous thrombosis and embolism: Secondary | ICD-10-CM | POA: Diagnosis not present

## 2023-01-25 DIAGNOSIS — N183 Chronic kidney disease, stage 3 unspecified: Secondary | ICD-10-CM | POA: Diagnosis not present

## 2023-01-25 DIAGNOSIS — E785 Hyperlipidemia, unspecified: Secondary | ICD-10-CM | POA: Diagnosis not present

## 2023-01-27 DIAGNOSIS — G8929 Other chronic pain: Secondary | ICD-10-CM | POA: Diagnosis not present

## 2023-01-27 DIAGNOSIS — M25561 Pain in right knee: Secondary | ICD-10-CM | POA: Diagnosis not present

## 2023-01-27 DIAGNOSIS — M25562 Pain in left knee: Secondary | ICD-10-CM | POA: Diagnosis not present

## 2023-02-03 DIAGNOSIS — H35363 Drusen (degenerative) of macula, bilateral: Secondary | ICD-10-CM | POA: Diagnosis not present

## 2023-02-17 ENCOUNTER — Other Ambulatory Visit: Payer: Self-pay | Admitting: Family Medicine

## 2023-02-17 ENCOUNTER — Telehealth: Payer: Self-pay

## 2023-02-17 DIAGNOSIS — G589 Mononeuropathy, unspecified: Secondary | ICD-10-CM

## 2023-02-17 DIAGNOSIS — M5410 Radiculopathy, site unspecified: Secondary | ICD-10-CM

## 2023-02-17 NOTE — Telephone Encounter (Signed)
Requested medications are due for refill today.  yes  Requested medications are on the active medications list.  yes  Last refill. 01/17/2023 #120 0 rf  Future visit scheduled.   no  Notes to clinic.  Refill not delegated.    Requested Prescriptions  Pending Prescriptions Disp Refills   oxyCODONE-acetaminophen (PERCOCET) 10-325 MG tablet 120 tablet 0    Sig: Take 1 tablet by mouth every 6 (six) hours as needed. for pain     Not Delegated - Analgesics:  Opioid Agonist Combinations Failed - 02/17/2023 10:24 AM      Failed - This refill cannot be delegated      Failed - Urine Drug Screen completed in last 360 days      Passed - Valid encounter within last 3 months    Recent Outpatient Visits           3 months ago Wound of gluteal cleft, unspecified laterality, initial encounter   [facility] Malva Limes, MD   5 months ago Wound of gluteal cleft, unspecified laterality, sequela   [facility] Merita Norton T, FNP   6 months ago Cellulitis of toe of left foot   [facility] [name], PA-C   7 months ago Cellulitis of toe of left foot   [facility] Malva Limes, MD   7 months ago Cellulitis of toe of left foot   [facility] Malva Limes, MD

## 2023-02-17 NOTE — Telephone Encounter (Signed)
Copied from CRM [id number]. Topic: General - Other >> Feb 17, 2023 10:08 AM Franchot Heidelberg wrote: Reason for CRM: Pt called to report that he got his flu shot last week at [facility]

## 2023-02-17 NOTE — Telephone Encounter (Signed)
Medication Refill -  Most Recent Primary Care Visit:  Provider: Malva Limes  Department: [facility]  Visit Type: OFFICE VISIT  Date: 11/19/2022  Medication: oxyCODONE-acetaminophen (PERCOCET) 10-325 MG tablet   Has the patient contacted their pharmacy? Yes (Agent: If no, request that the patient contact the pharmacy for the refill. If patient does not wish to contact the pharmacy document the reason why and proceed with request.) (Agent: If yes, when and what did the pharmacy advise?)  Is this the correct pharmacy for this prescription? Yes If no, delete pharmacy and type the correct one.  This is the patient's preferred pharmacy:   [facility] - [place], [place] - [address] [place] [address] Phone: [phone] Fax: [phone]  Has the prescription been filled recently? Yes  Is the patient out of the medication? No  Has the patient been seen for an appointment in the last year OR does the patient have an upcoming appointment? Yes  Can we respond through MyChart? No  Agent: Please be advised that Rx refills may take up to 3 business days. We ask that you follow-up with your pharmacy.

## 2023-02-18 DIAGNOSIS — D492 Neoplasm of unspecified behavior of bone, soft tissue, and skin: Secondary | ICD-10-CM | POA: Diagnosis not present

## 2023-02-18 DIAGNOSIS — C44329 Squamous cell carcinoma of skin of other parts of face: Secondary | ICD-10-CM | POA: Diagnosis not present

## 2023-02-18 DIAGNOSIS — C44212 Basal cell carcinoma of skin of right ear and external auricular canal: Secondary | ICD-10-CM | POA: Diagnosis not present

## 2023-02-18 DIAGNOSIS — L0100 Impetigo, unspecified: Secondary | ICD-10-CM | POA: Diagnosis not present

## 2023-02-18 MED ORDER — OXYCODONE-ACETAMINOPHEN 10-325 MG PO TABS: 1.0000 | ORAL_TABLET | Freq: Four times a day (QID) | ORAL | 0 refills | Status: DC | PRN

## 2023-02-18 NOTE — Telephone Encounter (Signed)
Immunization already documented

## 2023-02-28 DIAGNOSIS — C44222 Squamous cell carcinoma of skin of right ear and external auricular canal: Secondary | ICD-10-CM | POA: Diagnosis not present

## 2023-02-28 DIAGNOSIS — C4339 Malignant melanoma of other parts of face: Secondary | ICD-10-CM | POA: Diagnosis not present

## 2023-03-07 DIAGNOSIS — M25561 Pain in right knee: Secondary | ICD-10-CM | POA: Diagnosis not present

## 2023-03-07 DIAGNOSIS — M25562 Pain in left knee: Secondary | ICD-10-CM | POA: Diagnosis not present

## 2023-03-07 DIAGNOSIS — G8929 Other chronic pain: Secondary | ICD-10-CM | POA: Diagnosis not present

## 2023-03-15 DIAGNOSIS — C4330 Malignant melanoma of unspecified part of face: Secondary | ICD-10-CM | POA: Diagnosis not present

## 2023-03-16 ENCOUNTER — Other Ambulatory Visit: Payer: Self-pay | Admitting: Family Medicine

## 2023-03-16 ENCOUNTER — Ambulatory Visit: Payer: Self-pay | Admitting: *Deleted

## 2023-03-16 DIAGNOSIS — L97521 Non-pressure chronic ulcer of other part of left foot limited to breakdown of skin: Secondary | ICD-10-CM | POA: Diagnosis not present

## 2023-03-16 DIAGNOSIS — I739 Peripheral vascular disease, unspecified: Secondary | ICD-10-CM | POA: Diagnosis not present

## 2023-03-16 DIAGNOSIS — M5410 Radiculopathy, site unspecified: Secondary | ICD-10-CM

## 2023-03-16 DIAGNOSIS — G589 Mononeuropathy, unspecified: Secondary | ICD-10-CM

## 2023-03-16 NOTE — Telephone Encounter (Signed)
Patient advised. Verbalized understanding 

## 2023-03-16 NOTE — Telephone Encounter (Signed)
Stop for 3 days prior to surgery. Take last dose in the evening of December 20th if the surgery is on the 23, or the evening of December 21st if the surgery is on the 25th.

## 2023-03-16 NOTE — Telephone Encounter (Signed)
Message from [place] sent at 03/16/2023 11:13 AM EST  Summary: med management   Pt stated he is having surgery 12/23 or 12/24 on his cheek for melanoma.  Is asking when he should stop taking ELIQUIS.   Seeking clinical advice.          Call History  Contact Date/Time Type Contact Phone/Fax User  03/16/2023 11:11 AM EST Phone (Incoming) Mocarski, Jermale Labrum (Self) [phone] Judie Petit) McGill, Darlina Rumpf   Reason for Disposition  [1] Caller has URGENT medicine question about med that PCP or specialist prescribed AND [2] triager unable to answer question  Answer Assessment - Initial Assessment Questions 1. NAME of MEDICINE: "What medicine(s) are you calling about?"     Eliquis 5 mg  2. QUESTION: "What is your question?" (e.g., double dose of medicine, side effect)     See pt message.   He is having surgery on 12/23 or 12/24 and wanting to know when he should stop taking the Eliquis.   Has a melanoma on his cheek that is being removed. 3. PRESCRIBER: "Who prescribed the medicine?" Reason: if prescribed by specialist, call should be referred to that group.     Dr. Sherrie Mustache 4. SYMPTOMS: "Do you have any symptoms?" If Yes, ask: "What symptoms are you having?"  "How bad are the symptoms (e.g., mild, moderate, severe)     For surgery   5. PREGNANCY:  "Is there any chance that you are pregnant?" "When was your last menstrual period?"     N/A  Protocols used: Medication Question Call-A-AH

## 2023-03-16 NOTE — Telephone Encounter (Signed)
Medication Refill -  Most Recent Primary Care Visit:  Provider: Malva Limes  Department: [facility]  Visit Type: OFFICE VISIT  Date: 11/19/2022  Medication: oxyCODONE-acetaminophen (PERCOCET) 10-325 MG tablet   Has the patient contacted their pharmacy? No (Agent: If no, request that the patient contact the pharmacy for the refill. If patient does not wish to contact the pharmacy document the reason why and proceed with request.)  Is this the correct pharmacy for this prescription? Yes If no, delete pharmacy and type the correct one.  This is the patient's preferred pharmacy:    [facility] - [place], [place] - [address] [place] [address] Phone: [phone] Fax: [phone]     Has the prescription been filled recently? Yes  Is the patient out of the medication? No  Has the patient been seen for an appointment in the last year OR does the patient have an upcoming appointment? Yes  Can we respond through MyChart? No  Agent: Please be advised that Rx refills may take up to 3 business days. We ask that you follow-up with your pharmacy.

## 2023-03-16 NOTE — Telephone Encounter (Signed)
  Chief Complaint: Pt having surgery on 04/04/2023 or 12/24 on his cheek for melanoma.   Asking when he should stop taking the Eliquis. Symptoms: For surgery Frequency: 12/23 or 24. Pertinent Negatives: Patient denies N/A Disposition: [] ED /[] Urgent Care (no appt availability in office) / [] Appointment(In office/virtual)/ []  [place] [facility]/ [] Home Care/ [] Refused Recommended Disposition /[] [place] [facility]/ [x]  Follow-up with PCP Additional Notes: Message sent to Dr. Sherrie Mustache.

## 2023-03-17 ENCOUNTER — Telehealth: Payer: Self-pay | Admitting: Family Medicine

## 2023-03-17 MED ORDER — OXYCODONE-ACETAMINOPHEN 10-325 MG PO TABS: 1.0000 | ORAL_TABLET | Freq: Four times a day (QID) | ORAL | 0 refills | Status: DC | PRN

## 2023-03-17 NOTE — Telephone Encounter (Signed)
Medication Refill -  Most Recent Primary Care Visit:  Provider: Malva Limes  Department: [facility]  Visit Type: OFFICE VISIT  Date: 11/19/2022  Medication: oxyCODONE-acetaminophen (PERCOCET) 10-325 MG tablet   Has the patient contacted their pharmacy? No (Agent: If no, request that the patient contact the pharmacy for the refill. If patient does not wish to contact the pharmacy document the reason why and proceed with request.) (Agent: If yes, when and what did the pharmacy advise?)  Is this the correct pharmacy for this prescription? No If no, delete pharmacy and type the correct one.  This is the patient's preferred pharmacy: [facility] [place], [place] - [address]   Has the prescription been filled recently? Yes  Is the patient out of the medication? No  Has the patient been seen for an appointment in the last year OR does the patient have an upcoming appointment? Yes  Can we respond through MyChart? Yes  Pt called earlier, but forgot to tell that he is going out of town today.  Would like to pick up today.

## 2023-03-24 DIAGNOSIS — D45 Polycythemia vera: Secondary | ICD-10-CM | POA: Diagnosis not present

## 2023-03-24 DIAGNOSIS — R600 Localized edema: Secondary | ICD-10-CM | POA: Diagnosis not present

## 2023-03-24 DIAGNOSIS — I739 Peripheral vascular disease, unspecified: Secondary | ICD-10-CM | POA: Diagnosis not present

## 2023-03-24 DIAGNOSIS — L97521 Non-pressure chronic ulcer of other part of left foot limited to breakdown of skin: Secondary | ICD-10-CM | POA: Diagnosis not present

## 2023-03-24 DIAGNOSIS — Z959 Presence of cardiac and vascular implant and graft, unspecified: Secondary | ICD-10-CM | POA: Diagnosis not present

## 2023-04-01 ENCOUNTER — Ambulatory Visit (INDEPENDENT_AMBULATORY_CARE_PROVIDER_SITE_OTHER): Payer: Medicare PPO | Admitting: Family Medicine

## 2023-04-01 VITALS — BP 150/94 | HR 77 | Resp 18 | Ht 70.0 in | Wt 164.0 lb

## 2023-04-01 DIAGNOSIS — M25562 Pain in left knee: Secondary | ICD-10-CM

## 2023-04-01 DIAGNOSIS — R011 Cardiac murmur, unspecified: Secondary | ICD-10-CM | POA: Diagnosis not present

## 2023-04-01 DIAGNOSIS — R7989 Other specified abnormal findings of blood chemistry: Secondary | ICD-10-CM

## 2023-04-01 DIAGNOSIS — R600 Localized edema: Secondary | ICD-10-CM | POA: Diagnosis not present

## 2023-04-01 DIAGNOSIS — I350 Nonrheumatic aortic (valve) stenosis: Secondary | ICD-10-CM | POA: Insufficient documentation

## 2023-04-01 DIAGNOSIS — G8929 Other chronic pain: Secondary | ICD-10-CM

## 2023-04-01 DIAGNOSIS — M25561 Pain in right knee: Secondary | ICD-10-CM | POA: Diagnosis not present

## 2023-04-01 NOTE — Patient Instructions (Signed)
.   Please review the attached list of medications and notify my office if there are any errors.   . Please bring all of your medications to every appointment so we can make sure that our medication list is the same as yours.   

## 2023-04-01 NOTE — Progress Notes (Signed)
Established patient visit   Patient: Jerry Mcpherson   DOB: May 20, 1940   82 y.o. Male  MRN: [id number] Visit Date: 04/01/2023  Today's healthcare provider: Mila Merry, MD   Chief Complaint  Patient presents with   Leg Swelling   Subjective    HPI  Seen by vascular at [facility] 03/24/2023 with significant bilateral edema, L>R. Had proBNP checked and was 1796 (n<=850). Nl d-dimer. Creatinine was 2.2, near baseline. Advised to f/u with PCP for cardiac eval. Otherwise feels well. Swelling has greatly improved since that visit, but not resolved  Patient also states his knee pain is getting worse and current oxycodone/apap is no longer adequately controlling pain. He is interested in steroid injections.  Medications: Outpatient Medications Prior to Visit  Medication Sig   amLODipine (NORVASC) 2.5 MG tablet TAKE ONE (1) TABLET BY MOUTH ONCE DAILY   Ascorbic Acid (VITAMIN C) 1000 MG tablet Take 2 tablets by mouth daily.   aspirin 81 MG tablet Take 2 tablets daily   budesonide-formoterol (SYMBICORT) 160-4.5 MCG/ACT inhaler Inhale 2 puffs into the lungs 2 (two) times daily.   clopidogrel (PLAVIX) 75 MG tablet Take 75 mg by mouth daily.   cyproheptadine (PERIACTIN) 4 MG tablet TAKE 1/2-1 TABLET BY MOUTH THREE TIMES DAILY AS NEEDED TO HELP APPETITE   diclofenac Sodium (VOLTAREN) 1 % GEL Apply 4 g topically 4 (four) times daily as needed.   docusate sodium (COLACE) 100 MG capsule Take 1 capsule by mouth daily as needed.   ELIQUIS 5 MG TABS tablet Take 1 tablet (5 mg total) by mouth 2 (two) times daily.   fluticasone (FLONASE) 50 MCG/ACT nasal spray Place 2 sprays into both nostrils daily.   hydrochlorothiazide (HYDRODIURIL) 25 MG tablet TAKE 1 TABLET BY MOUTH EVERY DAY   hydroxyurea (HYDREA) 500 MG capsule Take 500 mg by mouth as directed. 2 tablets 3 days a week, and 1 tablet all other days   levothyroxine (SYNTHROID) 75 MCG tablet TAKE 1 TABLET BY MOUTH EVERY DAY   metoprolol  succinate (TOPROL-XL) 50 MG 24 hr tablet Take 1 tablet (50 mg total) by mouth daily.   Multiple Vitamins-Minerals (MULTIVITAMIN ADULT PO) Take 1 tablet by mouth daily.   mupirocin cream (BACTROBAN) 2 % Apply 1 Application topically 2 (two) times daily.   naproxen (NAPROSYN) 500 MG tablet TAKE 1 TABLET(500 MG) BY MOUTH TWICE DAILY WITH A MEAL   oxyCODONE-acetaminophen (PERCOCET) 10-325 MG tablet Take 1 tablet by mouth every 6 (six) hours as needed. for pain   potassium chloride (KLOR-CON) 10 MEQ tablet Take by mouth.   promethazine (PHENERGAN) 12.5 MG tablet TAKE 1 TO 2 TABLETS(12.5 TO 25 MG) BY MOUTH EVERY 8 HOURS AS NEEDED FOR NAUSEA OR VOMITING   sildenafil (VIAGRA) 50 MG tablet Take 0.5-1 tablets by mouth daily as needed.   valACYclovir (VALTREX) 1000 MG tablet TAKE 1 TABLET BY MOUTH TWICE A DAY AS NEEDED   No facility-administered medications prior to visit.    Review of Systems  Constitutional:  Negative for chills, fatigue and unexpected weight change.  Respiratory:  Negative for cough, chest tightness, shortness of breath and wheezing.        Objective    BP (!) 150/94   Pulse 77   Resp 18   Ht 5\' 10"  (1.778 m)   Wt 164 lb (74.4 kg)   SpO2 98%   BMI 23.53 kg/m    Physical Exam   General: Appearance:    Well developed,  well nourished male in no acute distress  Eyes:    PERRL, conjunctiva/corneas clear, EOM's intact       Lungs:     Clear to auscultation bilaterally, respirations unlabored  Heart:    Normal heart rate. Normal rhythm.  3/6 mid-systolic murmur at right upper sternal border   MS:   All extremities are intact.  1+ right lower leg edema, 2+ on right.   Neurologic:   Awake, alert, oriented x 3. No apparent focal neurological defect.       EKG: SR c premature SV complexes. Otherwise normal.   Assessment & Plan     1. Elevated brain natriuretic peptide (BNP) level (Primary)  2. Systolic murmur No dyspnea, CP or syncope - ECHOCARDIOGRAM COMPLETE;  Future  3. Bilateral leg edema Followed by vascular.   4. Chronic pain of both knees No longer controlled on current oxycodone/apap  - Ambulatory referral to Orthopedic Surgery        Mila Merry, MD  [facility] [phone] (phone) [phone] (fax)  [facility]

## 2023-04-14 ENCOUNTER — Other Ambulatory Visit: Payer: Self-pay | Admitting: Family Medicine

## 2023-04-14 ENCOUNTER — Telehealth: Payer: Self-pay | Admitting: Family Medicine

## 2023-04-14 DIAGNOSIS — N183 Chronic kidney disease, stage 3 unspecified: Secondary | ICD-10-CM | POA: Diagnosis not present

## 2023-04-14 DIAGNOSIS — D7219 Other eosinophilia: Secondary | ICD-10-CM | POA: Diagnosis not present

## 2023-04-14 DIAGNOSIS — G589 Mononeuropathy, unspecified: Secondary | ICD-10-CM

## 2023-04-14 DIAGNOSIS — D45 Polycythemia vera: Secondary | ICD-10-CM | POA: Diagnosis not present

## 2023-04-14 DIAGNOSIS — M5410 Radiculopathy, site unspecified: Secondary | ICD-10-CM

## 2023-04-14 DIAGNOSIS — C678 Malignant neoplasm of overlapping sites of bladder: Secondary | ICD-10-CM | POA: Diagnosis not present

## 2023-04-14 NOTE — Telephone Encounter (Signed)
 Patient called to let provider know he will be having test done next week to confirm if he has CHF. Doctors are suspecting that the swelling in his left foot and right knee down is coming from his heart.

## 2023-04-14 NOTE — Telephone Encounter (Signed)
 Medication Refill -  Most Recent Primary Care Visit:  Provider: [name]  Department: [facility]  Visit Type: OFFICE VISIT  Date: 04/01/2023  Medication: oxyCODONE -acetaminophen  (PERCOCET) 10-325 MG tablet   Has the patient contacted their pharmacy? No  Is this the correct pharmacy for this prescription? Yes  This is the patient's preferred pharmacy:  [facility] - [place], [place] - [address] [place] [address] Phone: [phone] Fax: [phone]   Has the prescription been filled recently? Yes  Is the patient out of the medication? Yes  Has the patient been seen for an appointment in the last year OR does the patient have an upcoming appointment? Yes  Can we respond through MyChart? No  Agent: Please be advised that Rx refills may take up to 3 business days. We ask that you follow-up with your pharmacy.

## 2023-04-18 ENCOUNTER — Other Ambulatory Visit: Payer: Self-pay | Admitting: Family Medicine

## 2023-04-18 DIAGNOSIS — I739 Peripheral vascular disease, unspecified: Secondary | ICD-10-CM | POA: Diagnosis not present

## 2023-04-18 DIAGNOSIS — G589 Mononeuropathy, unspecified: Secondary | ICD-10-CM

## 2023-04-18 DIAGNOSIS — Z5900 Homelessness unspecified: Secondary | ICD-10-CM | POA: Diagnosis not present

## 2023-04-18 DIAGNOSIS — M5410 Radiculopathy, site unspecified: Secondary | ICD-10-CM

## 2023-04-18 DIAGNOSIS — N1832 Chronic kidney disease, stage 3b: Secondary | ICD-10-CM | POA: Diagnosis not present

## 2023-04-18 DIAGNOSIS — I10 Essential (primary) hypertension: Secondary | ICD-10-CM | POA: Diagnosis not present

## 2023-04-18 DIAGNOSIS — I483 Typical atrial flutter: Secondary | ICD-10-CM | POA: Diagnosis not present

## 2023-04-18 DIAGNOSIS — D45 Polycythemia vera: Secondary | ICD-10-CM | POA: Diagnosis not present

## 2023-04-18 NOTE — Telephone Encounter (Signed)
 Requested medication (s) are due for refill today: yes  Requested medication (s) are on the active medication list: yes  Last refill:  03/17/23 # 120   Future visit scheduled: no  Notes to clinic:  med not delegated to NT to RF   Requested Prescriptions  Pending Prescriptions Disp Refills   oxyCODONE -acetaminophen  (PERCOCET) 10-325 MG tablet 120 tablet 0    Sig: Take 1 tablet by mouth every 6 (six) hours as needed. for pain     Not Delegated - Analgesics:  Opioid Agonist Combinations Failed - 04/18/2023  2:40 PM      Failed - This refill cannot be delegated      Failed - Urine Drug Screen completed in last 360 days      Passed - Valid encounter within last 3 months    Recent Outpatient Visits           2 weeks ago Elevated brain natriuretic peptide (BNP) level   [facility] [name], MD   5 months ago Wound of gluteal cleft, unspecified laterality, initial encounter   [facility] [name], MD   7 months ago Wound of gluteal cleft, unspecified laterality, sequela   [facility] Emilio Marseille T, FNP   8 months ago Cellulitis of toe of left foot   [facility] Cyndi Shaver, PA-C   9 months ago Cellulitis of toe of left foot   [facility] Gasper, Nancyann BRAVO, MD

## 2023-04-18 NOTE — Progress Notes (Signed)
 Jerry, Mcpherson 04/18/2023 DOB: 1940/12/29  Age: 83 y.o. Clinic Note - Cardiovascular   PRIMARY CARE PROVIDER: [name], MD [address] [place] [address] [phone]   PATIENT PROFILE:  Jerry Mcpherson  is a 83 y.o.  male history of polycythemia vera (JAK2 +, 2020), bladder cancer s/p radical cystoprostatectomy (April 2022) and neoadjuvant dose-dense methotrexate , vinblastine , doxorubicin, and cisplatin  x3 cycles, CKD 3b, HTN, DVT (July 2022) who was hospitalize 12/29/2020 from H[facility]ology Clinic with new onset atrial flutter in the setting of newly diagnosed hypothyroidism. Now presents from Heme for eval of CHF with BNP 1700   PROBLEM LIST:    Problem List  Date Reviewed: 04/01/2023          ICD-10-CM Priority Class Noted - Resolved Diagnosed   Bilateral lower extremity edema R60.0   04/01/2023 - Present    Personal history of malignant melanoma of skin Z85.820   02/28/2023 - Present    Personal history of other malignant neoplasm of skin Z85.828   02/28/2023 - Present    PAD (peripheral artery disease) (CMS-HCC) I73.9   07/09/2022 - Present    Overview Signed 07/28/2022  1:16 PM by Jerry Almarie Links, NP    Formatting of this note might be different from the original.  SFA stenting for critical limb ischemia at [facility] 06/16/2022      Critical limb ischemia of left lower extremity (CMS/HHS-HCC) I70.222   06/18/2022 - Present    Stage 3b chronic kidney disease (CMS/HHS-HCC) N18.32   07/28/2021 - Present    Cystic mass of pancreas (HHS-HCC) K86.2   07/28/2021 - Present    Homeless Z59.00   02/18/2021 - Present    Typical atrial flutter (CMS/HHS-HCC) I48.3   12/29/2020 - Present    Hypothyroidism, acquired E03.9   12/29/2020 - Present    Acute deep vein thrombosis (DVT) of right femoral vein (CMS/HHS-HCC) I82.411   10/28/2020 - Present    Overview Signed 02/16/2022  7:40 AM by Jerry Darice Niemann, NP    Formatting of this note might be different from  the original. Acute deep venous thrombosis extending from the right popliteal vein to the right common femoral vein - [facility] 08/09/2021 Right upper extremity cephalic vein thrombosis - [facility] 08/04/2021 Acute right lower extremity DVT extending from the common femoral vein to the popliteal vein on U/S  - [facility] 10/24/2020      Malignant neoplasm of overlapping sites of bladder (CMS/HHS-HCC) C67.8   07/24/2020 - Present    Bladder cancer (CMS/HHS-HCC) C67.9   01/30/2020 - Present    MPN (myeloproliferative neoplasm) (CMS/HHS-HCC) D47.1   06/05/2018 - Present    JAK2 V617F mutation Z15.89   06/05/2018 - Present    Polycythemia vera (CMS/HHS-HCC) D45   05/30/2018 - Present    Transient diplopia H53.2   05/22/2018 - Present    Vertigo R42   Unknown - Present    Erythrocytosis D75.1   05/16/2018 - Present    Hypertension I10   12/10/2014 - Present    Hyperglycemia R73.9   01/03/2009 - Present    Overview Signed 06/25/2020  2:09 PM by Jerry Deland Monte, NP    Formatting of this note might be different from the original. A1c 01/03/09=5.7      RESOLVED: Bladder cancer (CMS/HHS-HCC) C67.9   07/24/2020 - 05/13/2022     MEDICATIONS: Current Outpatient Medications  Medication Sig Dispense Refill  . acetaminophen  (TYLENOL ) 500 MG tablet Take 500 mg by mouth every 6 (six)  hours as needed for Pain    . apixaban  (ELIQUIS ) 5 mg tablet Take 1 tablet (5 mg total) by mouth every 12 (twelve) hours 60 tablet 0  . clopidogreL  (PLAVIX ) 75 mg tablet Take 1 tablet (75 mg total) by mouth once daily 30 tablet 11  . diclofenac  (VOLTAREN ) 1 % topical gel     . hydroxyurea  (HYDREA ) 500 mg capsule Take 1 capsule (500 mg total) by mouth once daily for 180 days Take 1 capsule (500mg ) daily 30 capsule 5  . metoprolol  succinate (TOPROL -XL) 50 MG XL tablet Take 1 tablet (50 mg total) by mouth once daily 30 tablet 11  . oxyCODONE -acetaminophen  (PERCOCET) 10-325 mg tablet Take 1 tablet by mouth every 6 (six) hours as needed for Pain    . sodium  bicarbonate 650 MG tablet Take 1 tablet (650 mg total) by mouth 2 (two) times daily 120 tablet 0  . valACYclovir  (VALTREX ) 1000 MG tablet Take 1 tablet by mouth 2 (two) times daily as needed    . lidocaine 3 % Crea Apply 1 Application topically once daily Please bring to doctors appointments for debridements (Patient not taking: Reported on 04/18/2023) 28.35 g 2   No current facility-administered medications for this visit.    ALLERGIES: Allergies  Allergen Reactions  . Losartan Potassium Angioedema  . Shellfish Containing Products Swelling and Rash  . Celecoxib Unknown  . Codeine Palpitations  . Cyclobenzaprine Syncope    Musculoskeletal therapy agents cause excessive sedation  . Tramadol Hcl Nausea And Vomiting     Interval History:  01/15/2021 Jerry Mcpherson has been stable from a cardiovascular standpoint since his discharge. He reports his monitor kept coming off during the first ten days and he plans to return it today to see if anything can be read. He is active, works as an Engineer, [employer], walks a fair amount in those roles. He denies chest discomfort or limiting dyspnea. No orthopnea or PND. Had a prior right knee injury, has occasional swelling in that leg only. No palpitations or bleeding on apixaban . No lightheadedness or syncope. Home BPs well controlled on current triple therapy, 120s/70s.  04/20/2021 Stable from a cardiovascular standpoint since his last visit with no ED visits or admissions. 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 His Holter from 9/21-10/20/2022 returned and revealed baseline SR at 72 bpm with four episodes of auto detected AFL/SVT of approximately 30 seconds duration. In clinic today he denies palpitations, chest discomfort, sudden onset dyspnea. No orthopnea, PND, edema. He reports ongoing dizziness if he looks up or moves around but that is not associated with standing. No  syncope or falls. He is compliant with his meds.   04/18/23 History of Present Illness The patient presents with a chief complaint of progressive lower extremity edema over the past year, which has been worsening. The patient's condition has raised concerns about congestive heart failure. The patient has a history of melanoma, with a recent biopsy performed on the cheek and ear. The patient's upcoming operation for melanoma has been postponed due to the current leg swelling.  Hx of prostate ca with aggressive chemo Rx. Hx of PCV.    The patient has a history of atrial flutter, as indicated by a previous EKG. However, the patient reports no chest pain or pressure during physical activity, only experiencing shortness of breath. The patient also reports a persistent cough, which causes chest pain, but  this pain is not believed to be heart-related.  The patient has a history of vascular issues in the lower extremities, with previous interventions to improve arterial flow. Despite these interventions, the patient reports worsening leg ulcers and pain. He has had recent peripheral bypass.    The patient also has a urostomy bag due to a previous prostate surgery.   The patient's financial situation and living conditions are challenging, as he is currently living in a car and struggling to afford medications. The patient has previously been prescribed Eliquis  and metoprolol , but it is unclear if these medications are being taken regularly due to cost concerns.  The patient's overall condition and symptoms suggest a complex interplay of cardiac, vascular, and oncological issues. The progressive lower extremity edema, history of atrial flutter, and current shortness of breath are particularly concerning for potential cardiac dysfunction. The patient's history of melanoma and recent biopsies add another layer of complexity to the patient's overall health status.    Objective:   Vitals:   04/18/23 1530  BP:  (!) 134/93  BP Location: Left upper arm  Patient Position: Sitting  BP Cuff Size: Adult  Pulse: 102  Resp: 18  Temp: 36.4 C (97.6 F)  TempSrc: Oral  SpO2: 95%  Weight: 74.1 kg (163 lb 5.8 oz)  Height: 177.8 cm (5' 10)    General: This is a pleasant 83 y.o. male in no acute distress.  A bit unkept. Pretty dirty Skin: normal color/moisture/temperature/turgor.  Legs with bites Head and neck: JVP 7 cm at 45 degrees. Last. Today rest 8-10 and HJR to 12.  Mildly up.  No carotid bruits noted.  Carotid upstrokes are normal bilaterally.  Chest: somewhat diminished respiratory excursion with some right sided inspiratory and expiratory crackles (CT documented atelectasis  Cardiovascular: regular rhythm, normal S1 and S2 with a 2/6 systolic murmur heard best along the ULSM and suggestive of his moderately thickened and partial mobile aortic leaflets as well as his mild AS. No rubs/or gallops.  Abdomen: soft, non-tender to palpation, normal bowel sounds, no organomegaly/pulsatile masses/ or bruits. He wears an abdominal bag port care bag for his urine.  Urine is clean   Extremities: warm, 1+ edema on both sides to high tibia.  R > L. .   Neuro: the patient is alert and oriented and responded appropriately to questions, no focal abnormalities noted.  Tests:   No visits with results within 1 Day(s) from this visit.  Latest known visit with results is:  Hospital Outpatient Visit on 04/14/2023  Component Date Value Ref Range Status  . WBC (White Blood Cell Count) 04/14/2023 23.8 (H)  3.2 - 9.8 x10^9/L Final  . Hemoglobin 04/14/2023 12.5 (L)  13.7 - 17.3 g/dL Final  . Hematocrit 98/97/7974 43.6  39.0 - 49.0 % Final  . Platelets 04/14/2023 620 (H)  150 - 450 x10^9/L Final  . MCV (Mean Corpuscular Volume) 04/14/2023 78 (L)  80 - 98 fL Final  . MCH (Mean Corpuscular Hemoglobin) 04/14/2023 22.4 (L)  26.5 - 34.0 pg Final  . MCHC (Mean Corpuscular Hemoglobin * 04/14/2023 28.7 (L)  31.5 - 36.3 % Final  .  RBC (Red Blood Cell Count) 04/14/2023 5.57  4.37 - 5.74 x10^12/L Final  . RDW-CV (Red Cell Distribution Widt* 04/14/2023 21.3 (H)  11.5 - 14.5 % Final  . NRBC (Nucleated Red Blood Cell Cou* 04/14/2023 0.00  0 x10^9/L Final  . NRBC % (Nucleated Red Blood Cell %) 04/14/2023 0.0  % Final  .  MPV (Mean Platelet Volume) 04/14/2023 10.4  7.2 - 11.7 fL Final  . Sodium 04/14/2023 140  135 - 145 mmol/L Final  . Potassium 04/14/2023 4.6  3.5 - 5.0 mmol/L Final  . Chloride 04/14/2023 112 (H)  98 - 108 mmol/L Final  . Carbon Dioxide (CO2) 04/14/2023 20 (L)  21 - 30 mmol/L Final  . Urea Nitrogen (BUN) 04/14/2023 33 (H)  7 - 20 mg/dL Final  . Creatinine 98/97/7974 2.2 (H)  0.6 - 1.3 mg/dL Final  . Glucose 98/97/7974 120  70 - 140 mg/dL Final  . Calcium  04/14/2023 8.7  8.7 - 10.2 mg/dL Final  . AST (Aspartate Aminotransferase) 04/14/2023 14 (L)  15 - 41 U/L Final  . ALT (Alanine Aminotransferase) 04/14/2023 9 (L)  15 - 50 U/L Final  . Bilirubin, Total 04/14/2023 0.5  0.4 - 1.5 mg/dL Final  . Alk Phos (Alkaline Phosphatase) 04/14/2023 111 (H)  24 - 110 U/L Final  . Albumin 04/14/2023 4.0  3.5 - 4.8 g/dL Final  . Protein, Total 04/14/2023 7.1  6.2 - 8.1 g/dL Final  . Anion Gap 98/97/7974 8  3 - 12 mmol/L Final  . BUN/CREA Ratio 04/14/2023 15  6 - 27 Final  . Glomerular Filtration Rate (eGFR)  04/14/2023 29  mL/min/1.73sq m Final  . Vitamin B12 04/14/2023 239  180 - 914 pg/mL Final  . Folate (Folic Acid ) 04/14/2023 5.6 (L)  >6.5 ng/mL Final  . Vitamin D Total, 25OH 04/14/2023 17 (L)  30 - 100 ng/ml Final  . Parathyroid Hormone (PTH), Intact 04/14/2023 128 (H)  14 - 72 pg/mL Final   BNP up 1796 in Dec. Normal when last seen.    EKG 04/18/23 sinus at 94. PACs.  Consider septal MI NSSTW changes.  T changes new.  Septal MI may be lead placement.   Assessment and Plan:    HFpEF - presumed   Hx of Typical atrial flutter (CMS-HCC)  He is fluid up.  Needs loop.  Needs BP management.  Started lasix  at 20 mg  daily.  Would have started losartan at 25 mg daily but had angioedema.  No ACE/ARB/ARNI.  Pushed up on metoprolol  to twice daily.  For now poor MRA candidate.  Cost for SGLT2i.  Labs in a week.  Follow up 2-4 weeks with echo.  [facility] or [place].    Hypertension Elevated today.  No on as many meds as before.  Not on HCTZ.  Losartan and amlodipine  would be good but need the edema gone first.    Renal insufficiency  Stable g3 CKD. Continue current medical management.  Last BUN and Cr   33/2.2 stable  Follow-up: as below.  Future Appointments     Date/Time Provider Department Center Visit Type   04/19/2023 8:30 AM (Arrive by 8:15 AM) Runell Dale Bong, DPM [facility] RETURN   04/19/2023 10:30 AM (Arrive by 10:00 AM) Sallyann Tinnie Norris, PA [facility] POST[facility]OP   04/20/2023 2:50 PM (Arrive by 2:35 PM) Joshua Denise, PT [facility] [place] [facility]OT RETURN   04/25/2023 8:30 AM Ranelle Conch [facility] FOLLOW UP   05/04/2023 8:00 AM (Arrive by 7:45 AM) Bluford Dorn Holts, MD [facility] [address] MOHS EAR   05/12/2023 11:00 AM DMP NM SR 3 [facility] DMP NM LYMPH SPECT W CT DRAW   05/12/2023 1:00 PM DMP NM SR 8 [facility] DMP NM LYMPH  SPECT W CT SCAN   05/18/2023 10:20 AM (Arrive by 10:05 AM) [facility] Lab Draw [facility] LAB   05/18/2023 11:00 AM (Arrive by 10:30 AM) CC MR 1 [facility] Radiology MRI Cancer Ctr MRI COMBO   05/18/2023 12:20 PM (Arrive by 12:00 PM) CC CT 3 Cancer Center  CT Cancer Ctr CT CHEST WO INC 3D MIPS   05/18/2023 3:00 PM (Arrive by 2:45 PM) Sagester, Lamarr Cook, NP [facility] Clinic Cancer Ctr RETURN VISIT   05/26/2023 11:30 AM (Arrive by 11:00 AM) BC US  2 [facility] CREEK DU[facility] US  VASCULAR   05/26/2023 1:30 PM (Arrive by 1:00 PM)  Alona Coletta Redbird, PA [facility] CREEK RETURN VISIT   05/27/2023 11:00 AM (Arrive by 10:30 AM) Rosy Prentice Sharper, DO [facility] RETURN VISIT   06/20/2023 10:45 AM (Arrive by 10:30 AM) Johr, Lamar Shove, MD [facility] 1st Floor [address] RETURN VISIT   07/05/2023 2:20 PM (Arrive by 1:50 PM) Larraine Rosaline Degree, MD [facility] Cardiology [facility] CARD RETURN       SHARPER DELENA HAYS, MD  I spent a total of 45 minutes in both face-to-face and non-face-to-face activities, excluding procedures performed, for this visit on the date of this encounter.  Patient Instructions  Your heart is not efficiently getting blood to the kidneys. They are holding onto fluid which is why you have swelling.    Lasix  is a med that makes kidneys dump water.  Will make you make more urine.  Starting with 20mg  daily  may need 40 mg but will see.  Follow up will be set up next week for M, Tue or Wed at [facility] or [place].  Will call and tell you in the morning.   You need better BP control.  Increasing metoprolol  to 50 mg twice a day from once a day.

## 2023-04-19 ENCOUNTER — Other Ambulatory Visit: Payer: Self-pay | Admitting: Family Medicine

## 2023-04-19 DIAGNOSIS — G589 Mononeuropathy, unspecified: Secondary | ICD-10-CM

## 2023-04-19 DIAGNOSIS — L97521 Non-pressure chronic ulcer of other part of left foot limited to breakdown of skin: Secondary | ICD-10-CM | POA: Diagnosis not present

## 2023-04-19 DIAGNOSIS — M5410 Radiculopathy, site unspecified: Secondary | ICD-10-CM

## 2023-04-19 DIAGNOSIS — I739 Peripheral vascular disease, unspecified: Secondary | ICD-10-CM | POA: Diagnosis not present

## 2023-04-19 NOTE — Telephone Encounter (Signed)
 Requested medication (s) are due for refill today - no  Requested medication (s) are on the active medication list -yes  Future visit scheduled -no  Last refill: 04/19/23 #120  Notes to clinic: duplicate request- filled today- non delegated Rx  Requested Prescriptions  Pending Prescriptions Disp Refills   oxyCODONE -acetaminophen  (PERCOCET) 10-325 MG tablet [Pharmacy Med Name: OXYCODONE -APAP 10-325 MG TAB] 120 tablet     Sig: TAKE 1 TABLET BY MOUTH EVERY 6 HOURS AS NEEDED FOR PAIN     Not Delegated - Analgesics:  Opioid Agonist Combinations Failed - 04/19/2023 12:11 PM      Failed - This refill cannot be delegated      Failed - Urine Drug Screen completed in last 360 days      Passed - Valid encounter within last 3 months    Recent Outpatient Visits           2 weeks ago Elevated brain natriuretic peptide (BNP) level   [facility] [name], MD   5 months ago Wound of gluteal cleft, unspecified laterality, initial encounter   [facility] [name], MD   7 months ago Wound of gluteal cleft, unspecified laterality, sequela   [facility] Emilio Marseille T, FNP   8 months ago Cellulitis of toe of left foot   [facility] Cyndi Shaver, PA-C   9 months ago Cellulitis of toe of left foot   [facility] [name], MD                 Requested Prescriptions  Pending Prescriptions Disp Refills   oxyCODONE -acetaminophen  (PERCOCET) 10-325 MG tablet [Pharmacy Med Name: OXYCODONE -APAP 10-325 MG TAB] 120 tablet     Sig: TAKE 1 TABLET BY MOUTH EVERY 6 HOURS AS NEEDED FOR PAIN     Not Delegated - Analgesics:  Opioid Agonist Combinations Failed - 04/19/2023 12:11 PM      Failed - This refill cannot be delegated      Failed - Urine Drug Screen completed in last 360 days      Passed - Valid encounter within last 3 months    Recent  Outpatient Visits           2 weeks ago Elevated brain natriuretic peptide (BNP) level   [facility] [name], MD   5 months ago Wound of gluteal cleft, unspecified laterality, initial encounter   [facility] [name], MD   7 months ago Wound of gluteal cleft, unspecified laterality, sequela   [facility] Emilio Marseille T, FNP   8 months ago Cellulitis of toe of left foot   [facility] Cyndi Shaver, PA-C   9 months ago Cellulitis of toe of left foot   [facility] Gasper, Nancyann BRAVO, MD

## 2023-04-26 ENCOUNTER — Ambulatory Visit: Payer: Medicare PPO | Attending: Family Medicine

## 2023-05-06 DIAGNOSIS — R600 Localized edema: Secondary | ICD-10-CM | POA: Diagnosis not present

## 2023-05-06 DIAGNOSIS — I10 Essential (primary) hypertension: Secondary | ICD-10-CM | POA: Diagnosis not present

## 2023-05-06 DIAGNOSIS — R0609 Other forms of dyspnea: Secondary | ICD-10-CM | POA: Diagnosis not present

## 2023-05-10 ENCOUNTER — Ambulatory Visit: Payer: Self-pay

## 2023-05-10 NOTE — Telephone Encounter (Signed)
    Chief Complaint: Pt. Reports pain in left toes and swelling to foot since "they operated on me. I've been told I have heart failure and I've been told I don't have heart failure." Asking to be worked in this week if possible. No availability Symptoms: Pain to toes, moves toes without difficulty. Frequency: 2 months Pertinent Negatives: Patient denies  Disposition: [] ED /[] Urgent Care (no appt availability in office) / [] Appointment(In office/virtual)/ []  [place] [facility]/ [] Home Care/ [] Refused Recommended Disposition /[]  Mobile Bus/ [x]  Follow-up with PCP Additional Notes: Please advise pt.  Reason for Disposition  [1] Swollen foot AND [2] no fever  (Exceptions: localized bump from bunions, calluses, insect bite, sting)  Answer Assessment - Initial Assessment Questions 1. ONSET: "When did the pain start?"      2 months ago 2. LOCATION: "Where is the pain located?"      Left foot and toes pain 3. PAIN: "How bad is the pain?"    (Scale 1-10; or mild, moderate, severe)  - MILD (1-3): doesn't interfere with normal activities.   - MODERATE (4-7): interferes with normal activities (e.g., work or school) or awakens from sleep, limping.   - SEVERE (8-10): excruciating pain, unable to do any normal activities, unable to walk.      8 4. WORK OR EXERCISE: "Has there been any recent work or exercise that involved this part of the body?"      No 5. CAUSE: "What do you think is causing the foot pain?"     Unsure 6. OTHER SYMPTOMS: "Do you have any other symptoms?" (e.g., leg pain, rash, fever, numbness)     Swelling 7. PREGNANCY: "Is there any chance you are pregnant?" "When was your last menstrual period?"     N/a  Protocols used: Foot Pain-A-AH

## 2023-05-11 DIAGNOSIS — M85872 Other specified disorders of bone density and structure, left ankle and foot: Secondary | ICD-10-CM | POA: Diagnosis not present

## 2023-05-11 DIAGNOSIS — S91302A Unspecified open wound, left foot, initial encounter: Secondary | ICD-10-CM | POA: Diagnosis not present

## 2023-05-11 DIAGNOSIS — M7989 Other specified soft tissue disorders: Secondary | ICD-10-CM | POA: Diagnosis not present

## 2023-05-11 DIAGNOSIS — L03116 Cellulitis of left lower limb: Secondary | ICD-10-CM | POA: Diagnosis not present

## 2023-05-11 DIAGNOSIS — M19072 Primary osteoarthritis, left ankle and foot: Secondary | ICD-10-CM | POA: Diagnosis not present

## 2023-05-11 DIAGNOSIS — M7732 Calcaneal spur, left foot: Secondary | ICD-10-CM | POA: Diagnosis not present

## 2023-05-11 NOTE — Telephone Encounter (Signed)
Pt advised to go be see. Verbalized understanding and reports he will go to ED in [facility]

## 2023-05-16 ENCOUNTER — Other Ambulatory Visit: Payer: Self-pay | Admitting: Family Medicine

## 2023-05-16 DIAGNOSIS — G589 Mononeuropathy, unspecified: Secondary | ICD-10-CM

## 2023-05-16 DIAGNOSIS — M5410 Radiculopathy, site unspecified: Secondary | ICD-10-CM

## 2023-05-16 NOTE — Telephone Encounter (Unsigned)
Copied from CRM [id number]. Topic: General - Other >> May 16, 2023 12:42 PM Everette C wrote: Reason for CRM: Medication Refill - Most Recent Primary Care Visit:  Provider: Malva Limes Department: [id number] Visit Type: OFFICE VISIT Date: 04/01/2023  Medication: oxyCODONE-acetaminophen (PERCOCET) 10-325 MG tablet [[id number]]  Has the patient contacted their pharmacy? No (Agent: If no, request that the patient contact the pharmacy for the refill. If patient does not wish to contact the pharmacy document the reason why and proceed with request.) (Agent: If yes, when and what did the pharmacy advise?)  Is this the correct pharmacy for this prescription? Yes If no, delete pharmacy and type the correct one.  This is the patient's preferred pharmacy:   [facility] - [place], [place] - [address] [place] [address] Phone: [phone] Fax: [phone]   Has the prescription been filled recently? Yes  Is the patient out of the medication? Yes  Has the patient been seen for an appointment in the last year OR does the patient have an upcoming appointment? Yes  Can we respond through MyChart? No  Agent: Please be advised that Rx refills may take up to 3 business days. We ask that you follow-up with your pharmacy.

## 2023-05-17 DIAGNOSIS — S91302A Unspecified open wound, left foot, initial encounter: Secondary | ICD-10-CM | POA: Diagnosis not present

## 2023-05-17 DIAGNOSIS — L97529 Non-pressure chronic ulcer of other part of left foot with unspecified severity: Secondary | ICD-10-CM | POA: Diagnosis not present

## 2023-05-17 DIAGNOSIS — S91105A Unspecified open wound of left lesser toe(s) without damage to nail, initial encounter: Secondary | ICD-10-CM | POA: Diagnosis not present

## 2023-05-17 DIAGNOSIS — I739 Peripheral vascular disease, unspecified: Secondary | ICD-10-CM | POA: Diagnosis not present

## 2023-05-17 DIAGNOSIS — R600 Localized edema: Secondary | ICD-10-CM | POA: Diagnosis not present

## 2023-05-17 DIAGNOSIS — M79606 Pain in leg, unspecified: Secondary | ICD-10-CM | POA: Diagnosis not present

## 2023-05-17 DIAGNOSIS — N189 Chronic kidney disease, unspecified: Secondary | ICD-10-CM | POA: Diagnosis not present

## 2023-05-17 DIAGNOSIS — L0390 Cellulitis, unspecified: Secondary | ICD-10-CM | POA: Diagnosis not present

## 2023-05-17 DIAGNOSIS — I4892 Unspecified atrial flutter: Secondary | ICD-10-CM | POA: Diagnosis not present

## 2023-05-17 DIAGNOSIS — L97523 Non-pressure chronic ulcer of other part of left foot with necrosis of muscle: Secondary | ICD-10-CM | POA: Diagnosis not present

## 2023-05-17 DIAGNOSIS — C4320 Malignant melanoma of unspecified ear and external auricular canal: Secondary | ICD-10-CM | POA: Diagnosis not present

## 2023-05-17 DIAGNOSIS — I82511 Chronic embolism and thrombosis of right femoral vein: Secondary | ICD-10-CM | POA: Diagnosis not present

## 2023-05-17 DIAGNOSIS — I82401 Acute embolism and thrombosis of unspecified deep veins of right lower extremity: Secondary | ICD-10-CM | POA: Diagnosis not present

## 2023-05-17 DIAGNOSIS — Z5900 Homelessness unspecified: Secondary | ICD-10-CM | POA: Diagnosis not present

## 2023-05-17 DIAGNOSIS — B348 Other viral infections of unspecified site: Secondary | ICD-10-CM | POA: Diagnosis not present

## 2023-05-17 DIAGNOSIS — D72829 Elevated white blood cell count, unspecified: Secondary | ICD-10-CM | POA: Diagnosis not present

## 2023-05-17 DIAGNOSIS — R011 Cardiac murmur, unspecified: Secondary | ICD-10-CM | POA: Diagnosis not present

## 2023-05-17 DIAGNOSIS — J069 Acute upper respiratory infection, unspecified: Secondary | ICD-10-CM | POA: Diagnosis not present

## 2023-05-17 DIAGNOSIS — L539 Erythematous condition, unspecified: Secondary | ICD-10-CM | POA: Diagnosis not present

## 2023-05-17 DIAGNOSIS — L03116 Cellulitis of left lower limb: Secondary | ICD-10-CM | POA: Diagnosis not present

## 2023-05-17 DIAGNOSIS — E039 Hypothyroidism, unspecified: Secondary | ICD-10-CM | POA: Diagnosis not present

## 2023-05-17 DIAGNOSIS — Z95820 Peripheral vascular angioplasty status with implants and grafts: Secondary | ICD-10-CM | POA: Diagnosis not present

## 2023-05-17 DIAGNOSIS — I82501 Chronic embolism and thrombosis of unspecified deep veins of right lower extremity: Secondary | ICD-10-CM | POA: Diagnosis not present

## 2023-05-17 DIAGNOSIS — Z5902 Unsheltered homelessness: Secondary | ICD-10-CM | POA: Diagnosis not present

## 2023-05-17 DIAGNOSIS — N1832 Chronic kidney disease, stage 3b: Secondary | ICD-10-CM | POA: Diagnosis not present

## 2023-05-17 DIAGNOSIS — M948X7 Other specified disorders of cartilage, ankle and foot: Secondary | ICD-10-CM | POA: Diagnosis not present

## 2023-05-17 DIAGNOSIS — M79672 Pain in left foot: Secondary | ICD-10-CM | POA: Diagnosis not present

## 2023-05-17 DIAGNOSIS — I129 Hypertensive chronic kidney disease with stage 1 through stage 4 chronic kidney disease, or unspecified chronic kidney disease: Secondary | ICD-10-CM | POA: Diagnosis not present

## 2023-05-17 DIAGNOSIS — D45 Polycythemia vera: Secondary | ICD-10-CM | POA: Diagnosis not present

## 2023-05-17 DIAGNOSIS — L089 Local infection of the skin and subcutaneous tissue, unspecified: Secondary | ICD-10-CM | POA: Diagnosis not present

## 2023-05-17 DIAGNOSIS — R000 Tachycardia, unspecified: Secondary | ICD-10-CM | POA: Diagnosis not present

## 2023-05-17 MED ORDER — OXYCODONE-ACETAMINOPHEN 10-325 MG PO TABS: 1.0000 | ORAL_TABLET | Freq: Four times a day (QID) | ORAL | 0 refills | Status: DC | PRN

## 2023-05-17 NOTE — Telephone Encounter (Signed)
 Requested medication (s) are due for refill today - yes  Requested medication (s) are on the active medication list -yes  Future visit scheduled -no  Last refill: 04/19/23 #120  Notes to clinic: non delegated Rx  Requested Prescriptions  Pending Prescriptions Disp Refills   oxyCODONE -acetaminophen  (PERCOCET) 10-325 MG tablet 120 tablet 0    Sig: Take 1 tablet by mouth every 6 (six) hours as needed. for pain     Not Delegated - Analgesics:  Opioid Agonist Combinations Failed - 05/17/2023  8:36 AM      Failed - This refill cannot be delegated      Failed - Urine Drug Screen completed in last 360 days      Passed - Valid encounter within last 3 months    Recent Outpatient Visits           1 month ago Elevated brain natriuretic peptide (BNP) level   [facility] [name], MD   5 months ago Wound of gluteal cleft, unspecified laterality, initial encounter   [facility] [name], MD   8 months ago Wound of gluteal cleft, unspecified laterality, sequela   [facility] Emilio Marseille T, FNP   9 months ago Cellulitis of toe of left foot   [facility] Cyndi Shaver, PA-C   10 months ago Cellulitis of toe of left foot   [facility] [name], MD                 Requested Prescriptions  Pending Prescriptions Disp Refills   oxyCODONE -acetaminophen  (PERCOCET) 10-325 MG tablet 120 tablet 0    Sig: Take 1 tablet by mouth every 6 (six) hours as needed. for pain     Not Delegated - Analgesics:  Opioid Agonist Combinations Failed - 05/17/2023  8:36 AM      Failed - This refill cannot be delegated      Failed - Urine Drug Screen completed in last 360 days      Passed - Valid encounter within last 3 months    Recent Outpatient Visits           1 month ago Elevated brain natriuretic peptide (BNP) level   [facility] [name], MD   5 months ago Wound of gluteal cleft, unspecified laterality, initial encounter   [facility] [name], MD   8 months ago Wound of gluteal cleft, unspecified laterality, sequela   [facility] Emilio Marseille T, FNP   9 months ago Cellulitis of toe of left foot   [facility] Cyndi Shaver, PA-C   10 months ago Cellulitis of toe of left foot   [facility] Gasper, Nancyann BRAVO, MD

## 2023-05-18 DIAGNOSIS — L03116 Cellulitis of left lower limb: Secondary | ICD-10-CM | POA: Diagnosis not present

## 2023-05-19 DIAGNOSIS — D72829 Elevated white blood cell count, unspecified: Secondary | ICD-10-CM | POA: Diagnosis not present

## 2023-05-19 DIAGNOSIS — M948X7 Other specified disorders of cartilage, ankle and foot: Secondary | ICD-10-CM | POA: Diagnosis not present

## 2023-05-19 DIAGNOSIS — I739 Peripheral vascular disease, unspecified: Secondary | ICD-10-CM | POA: Diagnosis not present

## 2023-05-19 DIAGNOSIS — S91105A Unspecified open wound of left lesser toe(s) without damage to nail, initial encounter: Secondary | ICD-10-CM | POA: Diagnosis not present

## 2023-05-19 DIAGNOSIS — L03116 Cellulitis of left lower limb: Secondary | ICD-10-CM | POA: Diagnosis not present

## 2023-05-19 LAB — LAB REPORT - SCANNED: EGFR: 30

## 2023-05-20 DIAGNOSIS — S91105A Unspecified open wound of left lesser toe(s) without damage to nail, initial encounter: Secondary | ICD-10-CM | POA: Diagnosis not present

## 2023-05-20 DIAGNOSIS — L089 Local infection of the skin and subcutaneous tissue, unspecified: Secondary | ICD-10-CM | POA: Diagnosis not present

## 2023-05-20 DIAGNOSIS — B348 Other viral infections of unspecified site: Secondary | ICD-10-CM | POA: Diagnosis not present

## 2023-05-20 DIAGNOSIS — L03116 Cellulitis of left lower limb: Secondary | ICD-10-CM | POA: Diagnosis not present

## 2023-05-21 DIAGNOSIS — L03116 Cellulitis of left lower limb: Secondary | ICD-10-CM | POA: Diagnosis not present

## 2023-05-21 DIAGNOSIS — S91105A Unspecified open wound of left lesser toe(s) without damage to nail, initial encounter: Secondary | ICD-10-CM | POA: Diagnosis not present

## 2023-05-21 DIAGNOSIS — B348 Other viral infections of unspecified site: Secondary | ICD-10-CM | POA: Diagnosis not present

## 2023-05-21 DIAGNOSIS — M79672 Pain in left foot: Secondary | ICD-10-CM | POA: Diagnosis not present

## 2023-05-22 DIAGNOSIS — L03116 Cellulitis of left lower limb: Secondary | ICD-10-CM | POA: Diagnosis not present

## 2023-05-23 DIAGNOSIS — L03116 Cellulitis of left lower limb: Secondary | ICD-10-CM | POA: Diagnosis not present

## 2023-05-23 DIAGNOSIS — I82401 Acute embolism and thrombosis of unspecified deep veins of right lower extremity: Secondary | ICD-10-CM | POA: Diagnosis not present

## 2023-05-23 DIAGNOSIS — R600 Localized edema: Secondary | ICD-10-CM | POA: Diagnosis not present

## 2023-05-23 DIAGNOSIS — Z5900 Homelessness unspecified: Secondary | ICD-10-CM | POA: Diagnosis not present

## 2023-05-23 DIAGNOSIS — I739 Peripheral vascular disease, unspecified: Secondary | ICD-10-CM | POA: Diagnosis not present

## 2023-05-25 DIAGNOSIS — D45 Polycythemia vera: Secondary | ICD-10-CM | POA: Diagnosis not present

## 2023-05-25 DIAGNOSIS — N1832 Chronic kidney disease, stage 3b: Secondary | ICD-10-CM | POA: Diagnosis not present

## 2023-05-25 DIAGNOSIS — L03116 Cellulitis of left lower limb: Secondary | ICD-10-CM | POA: Diagnosis not present

## 2023-05-25 DIAGNOSIS — I129 Hypertensive chronic kidney disease with stage 1 through stage 4 chronic kidney disease, or unspecified chronic kidney disease: Secondary | ICD-10-CM | POA: Diagnosis not present

## 2023-05-25 DIAGNOSIS — D751 Secondary polycythemia: Secondary | ICD-10-CM | POA: Diagnosis not present

## 2023-05-25 DIAGNOSIS — I82411 Acute embolism and thrombosis of right femoral vein: Secondary | ICD-10-CM | POA: Diagnosis not present

## 2023-05-25 DIAGNOSIS — I872 Venous insufficiency (chronic) (peripheral): Secondary | ICD-10-CM | POA: Diagnosis not present

## 2023-05-25 DIAGNOSIS — I739 Peripheral vascular disease, unspecified: Secondary | ICD-10-CM | POA: Diagnosis not present

## 2023-05-25 DIAGNOSIS — I4892 Unspecified atrial flutter: Secondary | ICD-10-CM | POA: Diagnosis not present

## 2023-05-27 ENCOUNTER — Telehealth: Payer: Self-pay

## 2023-05-27 DIAGNOSIS — I4892 Unspecified atrial flutter: Secondary | ICD-10-CM | POA: Diagnosis not present

## 2023-05-27 DIAGNOSIS — D751 Secondary polycythemia: Secondary | ICD-10-CM | POA: Diagnosis not present

## 2023-05-27 DIAGNOSIS — I129 Hypertensive chronic kidney disease with stage 1 through stage 4 chronic kidney disease, or unspecified chronic kidney disease: Secondary | ICD-10-CM | POA: Diagnosis not present

## 2023-05-27 DIAGNOSIS — I82411 Acute embolism and thrombosis of right femoral vein: Secondary | ICD-10-CM | POA: Diagnosis not present

## 2023-05-27 DIAGNOSIS — N1832 Chronic kidney disease, stage 3b: Secondary | ICD-10-CM | POA: Diagnosis not present

## 2023-05-27 DIAGNOSIS — I739 Peripheral vascular disease, unspecified: Secondary | ICD-10-CM | POA: Diagnosis not present

## 2023-05-27 DIAGNOSIS — I872 Venous insufficiency (chronic) (peripheral): Secondary | ICD-10-CM | POA: Diagnosis not present

## 2023-05-27 DIAGNOSIS — D45 Polycythemia vera: Secondary | ICD-10-CM | POA: Diagnosis not present

## 2023-05-27 DIAGNOSIS — L03116 Cellulitis of left lower limb: Secondary | ICD-10-CM | POA: Diagnosis not present

## 2023-05-27 NOTE — Telephone Encounter (Signed)
Copied from CRM [id number]. Topic: General - Other >> May 26, 2023  1:56 PM Dondra Prader E wrote: Reason for CRM: Pt called requesting to speak to the clinic regarding a suspicious call he received asking him to schedule a test per his PCP.   Best contact: [phone]

## 2023-05-27 NOTE — Telephone Encounter (Signed)
Patient requested an earlier appointment with Dr. Sherrie Mustache before Friday. Scheduled patient for Monday 17 at 9:20 AM.

## 2023-05-30 ENCOUNTER — Ambulatory Visit (INDEPENDENT_AMBULATORY_CARE_PROVIDER_SITE_OTHER): Payer: Medicare PPO | Admitting: Family Medicine

## 2023-05-30 VITALS — BP 139/78 | HR 93 | Temp 97.7°F | Resp 16 | Wt 149.0 lb

## 2023-05-30 DIAGNOSIS — D45 Polycythemia vera: Secondary | ICD-10-CM | POA: Diagnosis not present

## 2023-05-30 DIAGNOSIS — L03116 Cellulitis of left lower limb: Secondary | ICD-10-CM | POA: Diagnosis not present

## 2023-05-30 DIAGNOSIS — I739 Peripheral vascular disease, unspecified: Secondary | ICD-10-CM | POA: Diagnosis not present

## 2023-05-30 DIAGNOSIS — S91302A Unspecified open wound, left foot, initial encounter: Secondary | ICD-10-CM

## 2023-05-30 DIAGNOSIS — I82411 Acute embolism and thrombosis of right femoral vein: Secondary | ICD-10-CM | POA: Diagnosis not present

## 2023-05-30 DIAGNOSIS — I872 Venous insufficiency (chronic) (peripheral): Secondary | ICD-10-CM | POA: Diagnosis not present

## 2023-05-30 DIAGNOSIS — N1832 Chronic kidney disease, stage 3b: Secondary | ICD-10-CM | POA: Diagnosis not present

## 2023-05-30 DIAGNOSIS — I4892 Unspecified atrial flutter: Secondary | ICD-10-CM | POA: Diagnosis not present

## 2023-05-30 DIAGNOSIS — L03032 Cellulitis of left toe: Secondary | ICD-10-CM

## 2023-05-30 DIAGNOSIS — D751 Secondary polycythemia: Secondary | ICD-10-CM | POA: Diagnosis not present

## 2023-05-30 DIAGNOSIS — S91302D Unspecified open wound, left foot, subsequent encounter: Secondary | ICD-10-CM | POA: Diagnosis not present

## 2023-05-30 DIAGNOSIS — I129 Hypertensive chronic kidney disease with stage 1 through stage 4 chronic kidney disease, or unspecified chronic kidney disease: Secondary | ICD-10-CM | POA: Diagnosis not present

## 2023-05-30 MED ORDER — DOXYCYCLINE HYCLATE 100 MG PO TABS: 100.0000 mg | ORAL_TABLET | Freq: Two times a day (BID) | ORAL | 0 refills | Status: AC

## 2023-05-30 NOTE — Patient Instructions (Signed)
 Marland Kitchen  Please review the attached list of medications and notify my office if there are any errors.   . Please bring all of your medications to every appointment so we can make sure that our medication list is the same as yours.

## 2023-05-31 ENCOUNTER — Telehealth: Payer: Self-pay

## 2023-05-31 NOTE — Telephone Encounter (Signed)
 Copied from CRM [id number]. Topic: Referral - Status >> May 31, 2023 10:28 AM Jerry Mcpherson wrote: Reason for CRM: Pt states that he seen his PCP yesterday and was told that Mcpherson referral would be placed for wound care for his toes. Pt states that he is needing this referral as soon as possible. Please advise.

## 2023-06-01 DIAGNOSIS — I4892 Unspecified atrial flutter: Secondary | ICD-10-CM | POA: Diagnosis not present

## 2023-06-01 DIAGNOSIS — D45 Polycythemia vera: Secondary | ICD-10-CM | POA: Diagnosis not present

## 2023-06-01 DIAGNOSIS — D751 Secondary polycythemia: Secondary | ICD-10-CM | POA: Diagnosis not present

## 2023-06-01 DIAGNOSIS — L03116 Cellulitis of left lower limb: Secondary | ICD-10-CM | POA: Diagnosis not present

## 2023-06-01 DIAGNOSIS — I82411 Acute embolism and thrombosis of right femoral vein: Secondary | ICD-10-CM | POA: Diagnosis not present

## 2023-06-01 DIAGNOSIS — N1832 Chronic kidney disease, stage 3b: Secondary | ICD-10-CM | POA: Diagnosis not present

## 2023-06-01 DIAGNOSIS — I872 Venous insufficiency (chronic) (peripheral): Secondary | ICD-10-CM | POA: Diagnosis not present

## 2023-06-01 DIAGNOSIS — I129 Hypertensive chronic kidney disease with stage 1 through stage 4 chronic kidney disease, or unspecified chronic kidney disease: Secondary | ICD-10-CM | POA: Diagnosis not present

## 2023-06-01 DIAGNOSIS — I739 Peripheral vascular disease, unspecified: Secondary | ICD-10-CM | POA: Diagnosis not present

## 2023-06-01 NOTE — Progress Notes (Signed)
 Established patient visit   Patient: Jerry Mcpherson   DOB: [birth year]   83 y.o. Male  MRN: [id number] Visit Date: 05/30/2023  Today's healthcare provider: Mila Merry, MD   Chief Complaint  Patient presents with   Hospitalization Follow-up    Admitted to [facility] 05/17/23-05/24/2023   Subjective    Discussed the use of AI scribe software for clinical note transcription with the patient, who gave verbal consent to proceed.  History of Present Illness   Jerry Mcpherson is an 83 year old male with a history of vascular issues who presents with a recurrent left toe infection.  He initially developed an ulcer between his toes and on the side two months ago, which was treated with Betadine, resolving temporarily. The infection recurred between the next to last toe, causing persistent soreness and severe pain, leading to hospitalization due to inability to walk. An MRI confirmed the infection was confined to the skin. He was treated with IV antibiotics and discharged to his nephew's house, completing the antibiotic course yesterday. Despite this, the area remains sore. He has a history of vascular issues, having undergone procedures to open arteries and place a stent, and continues to follow up with vascular specialists at [facility]. He also has a history of leg swelling, previously evaluated with an echocardiogram revealing a heart murmur but no significant heart issues.  He is currently staying at his nephew's house due to his medical condition and has been there for the past three weeks. He previously lived in his brother-in-law's house, which has been unoccupied since his brother-in-law's second wife passed away.  He manages chronic pain primarily in his knees and legs with oxycodone/APAP, taken as needed approximately every six to eight hours, and occasionally for foot pain. He prefers to fill prescriptions at [facility] pharmacy due to past issues with [facility]. The hospitalist  from his recently hospitalization at [facility] was concerned about diversion because oxycodone/apap [facility] on the same day patient was hospitalized. However, the patient states he had an acquaintance pick up the prescription for him on that date because he was unable to get to the pharmacy. He states that he takes it most days and typically picks up refills once a month.       Medications: Outpatient Medications Prior to Visit  Medication Sig   amLODipine (NORVASC) 2.5 MG tablet TAKE ONE (1) TABLET BY MOUTH ONCE DAILY   Ascorbic Acid (VITAMIN C) 1000 MG tablet Take 2 tablets by mouth daily.   aspirin 81 MG tablet Take 2 tablets daily   budesonide-formoterol (SYMBICORT) 160-4.5 MCG/ACT inhaler Inhale 2 puffs into the lungs 2 (two) times daily.   clopidogrel (PLAVIX) 75 MG tablet Take 75 mg by mouth daily.   cyproheptadine (PERIACTIN) 4 MG tablet TAKE 1/2-1 TABLET BY MOUTH THREE TIMES DAILY AS NEEDED TO HELP APPETITE   diclofenac Sodium (VOLTAREN) 1 % GEL Apply 4 g topically 4 (four) times daily as needed.   docusate sodium (COLACE) 100 MG capsule Take 1 capsule by mouth daily as needed.   ELIQUIS 5 MG TABS tablet Take 1 tablet (5 mg total) by mouth 2 (two) times daily.   fluticasone (FLONASE) 50 MCG/ACT nasal spray Place 2 sprays into both nostrils daily.   hydrochlorothiazide (HYDRODIURIL) 25 MG tablet TAKE 1 TABLET BY MOUTH EVERY DAY   hydroxyurea (HYDREA) 500 MG capsule Take 500 mg by mouth as directed. 2 tablets 3 days a week, and 1  tablet all other days   levothyroxine (SYNTHROID) 75 MCG tablet TAKE 1 TABLET BY MOUTH EVERY DAY   metoprolol succinate (TOPROL-XL) 50 MG 24 hr tablet Take 1 tablet (50 mg total) by mouth daily.   Multiple Vitamins-Minerals (MULTIVITAMIN ADULT PO) Take 1 tablet by mouth daily.   mupirocin cream (BACTROBAN) 2 % Apply 1 Application topically 2 (two) times daily.   naproxen (NAPROSYN) 500 MG tablet TAKE 1 TABLET(500 MG) BY MOUTH TWICE DAILY WITH A MEAL    oxyCODONE-acetaminophen (PERCOCET) 10-325 MG tablet Take 1 tablet by mouth every 6 (six) hours as needed. for pain   potassium chloride (KLOR-CON) 10 MEQ tablet Take by mouth.   promethazine (PHENERGAN) 12.5 MG tablet TAKE 1 TO 2 TABLETS(12.5 TO 25 MG) BY MOUTH EVERY 8 HOURS AS NEEDED FOR NAUSEA OR VOMITING   sildenafil (VIAGRA) 50 MG tablet Take 0.5-1 tablets by mouth daily as needed.   valACYclovir (VALTREX) 1000 MG tablet TAKE 1 TABLET BY MOUTH TWICE A DAY AS NEEDED   No facility-administered medications prior to visit.   Review of Systems     Objective    BP 139/78 (BP Location: Left Arm, Patient Position: Sitting, Cuff Size: Normal)   Pulse 93   Temp 97.7 F (36.5 C) (Oral)   Resp 16   Wt 149 lb (67.6 kg)   BMI 21.38 kg/m   Physical Exam  Removed dressing left foot with open wound between toes and redness and swelling of surround tissue and foot. No red streaks.   Assessment & Plan       Foot Infection Recurrent infection between toes, previously treated with Betadine. Recent hospitalization with IV antibiotics, but still sore. No bone involvement per recent MRI. -Start Doxycycline, [facility] -Refer to wound care clinic for further management.  Chronic Pain Pain in legs and knees, managed with Oxycodone as needed. -Continue Oxycodone as needed for pain, prescription to be sent to [facility] pharmacy.  Follow-up -Schedule follow-up appointment in May 2025.    Return in about 3 months (around 08/27/2023).      Mila Merry, MD  [facility] [phone] (phone) [phone] (fax)  [facility]

## 2023-06-01 NOTE — Telephone Encounter (Signed)
 Note has been completed and order signed.

## 2023-06-02 DIAGNOSIS — I739 Peripheral vascular disease, unspecified: Secondary | ICD-10-CM | POA: Diagnosis not present

## 2023-06-02 DIAGNOSIS — I82411 Acute embolism and thrombosis of right femoral vein: Secondary | ICD-10-CM | POA: Diagnosis not present

## 2023-06-02 DIAGNOSIS — I872 Venous insufficiency (chronic) (peripheral): Secondary | ICD-10-CM | POA: Diagnosis not present

## 2023-06-02 DIAGNOSIS — E039 Hypothyroidism, unspecified: Secondary | ICD-10-CM

## 2023-06-02 DIAGNOSIS — N1832 Chronic kidney disease, stage 3b: Secondary | ICD-10-CM | POA: Diagnosis not present

## 2023-06-02 DIAGNOSIS — I129 Hypertensive chronic kidney disease with stage 1 through stage 4 chronic kidney disease, or unspecified chronic kidney disease: Secondary | ICD-10-CM | POA: Diagnosis not present

## 2023-06-02 DIAGNOSIS — I4892 Unspecified atrial flutter: Secondary | ICD-10-CM | POA: Diagnosis not present

## 2023-06-02 DIAGNOSIS — L03116 Cellulitis of left lower limb: Secondary | ICD-10-CM | POA: Diagnosis not present

## 2023-06-02 DIAGNOSIS — D45 Polycythemia vera: Secondary | ICD-10-CM | POA: Diagnosis not present

## 2023-06-02 DIAGNOSIS — H9193 Unspecified hearing loss, bilateral: Secondary | ICD-10-CM

## 2023-06-02 DIAGNOSIS — E785 Hyperlipidemia, unspecified: Secondary | ICD-10-CM | POA: Diagnosis not present

## 2023-06-03 ENCOUNTER — Telehealth: Payer: Self-pay | Admitting: Family Medicine

## 2023-06-03 ENCOUNTER — Inpatient Hospital Stay: Payer: Medicare PPO | Admitting: Family Medicine

## 2023-06-03 DIAGNOSIS — D751 Secondary polycythemia: Secondary | ICD-10-CM | POA: Diagnosis not present

## 2023-06-03 DIAGNOSIS — N1832 Chronic kidney disease, stage 3b: Secondary | ICD-10-CM | POA: Diagnosis not present

## 2023-06-03 DIAGNOSIS — I82411 Acute embolism and thrombosis of right femoral vein: Secondary | ICD-10-CM | POA: Diagnosis not present

## 2023-06-03 DIAGNOSIS — I4892 Unspecified atrial flutter: Secondary | ICD-10-CM | POA: Diagnosis not present

## 2023-06-03 DIAGNOSIS — D45 Polycythemia vera: Secondary | ICD-10-CM | POA: Diagnosis not present

## 2023-06-03 DIAGNOSIS — I129 Hypertensive chronic kidney disease with stage 1 through stage 4 chronic kidney disease, or unspecified chronic kidney disease: Secondary | ICD-10-CM | POA: Diagnosis not present

## 2023-06-03 DIAGNOSIS — I739 Peripheral vascular disease, unspecified: Secondary | ICD-10-CM | POA: Diagnosis not present

## 2023-06-03 DIAGNOSIS — L03116 Cellulitis of left lower limb: Secondary | ICD-10-CM | POA: Diagnosis not present

## 2023-06-03 DIAGNOSIS — I872 Venous insufficiency (chronic) (peripheral): Secondary | ICD-10-CM | POA: Diagnosis not present

## 2023-06-03 NOTE — Telephone Encounter (Signed)
 [facility] HHA certification paperwork faxed in 05/27/23  and placed in provider mailbox. Signed and completed and faxed back 06/03/23 VM

## 2023-06-06 ENCOUNTER — Telehealth: Payer: Self-pay | Admitting: Family Medicine

## 2023-06-06 ENCOUNTER — Telehealth: Payer: Self-pay

## 2023-06-06 NOTE — Telephone Encounter (Unsigned)
 Copied from CRM [id number]. Topic: Referral - Request for Referral >> Jun 06, 2023  1:46 PM Yolanda T wrote: Did the patient discuss referral with their provider in the last year? Yes  Appointment offered? No  Type of order/referral and detailed reason for visit: Wound Clinic  Preference of office, provider, location: [facility]  If referral order, have you been seen by this specialty before? No (If Yes, this issue or another issue? When? Where?  Can we respond through MyChart? Yes  Patient called stated he called the [facility] in [place] and he doesn't want to wait 3 weeks.

## 2023-06-06 NOTE — Telephone Encounter (Signed)
 Copied from CRM [id number]. Topic: Referral - Request for Referral >> Jun 06, 2023  9:46 AM Phill Myron wrote: Patient [place] stated [facility] can not see him for three weeks, can you find a facility that has a sooner appt. Please advise

## 2023-06-07 ENCOUNTER — Other Ambulatory Visit: Payer: Self-pay | Admitting: Family Medicine

## 2023-06-07 DIAGNOSIS — M5410 Radiculopathy, site unspecified: Secondary | ICD-10-CM

## 2023-06-07 DIAGNOSIS — D751 Secondary polycythemia: Secondary | ICD-10-CM | POA: Diagnosis not present

## 2023-06-07 DIAGNOSIS — I129 Hypertensive chronic kidney disease with stage 1 through stage 4 chronic kidney disease, or unspecified chronic kidney disease: Secondary | ICD-10-CM | POA: Diagnosis not present

## 2023-06-07 DIAGNOSIS — D45 Polycythemia vera: Secondary | ICD-10-CM | POA: Diagnosis not present

## 2023-06-07 DIAGNOSIS — I739 Peripheral vascular disease, unspecified: Secondary | ICD-10-CM | POA: Diagnosis not present

## 2023-06-07 DIAGNOSIS — I4892 Unspecified atrial flutter: Secondary | ICD-10-CM | POA: Diagnosis not present

## 2023-06-07 DIAGNOSIS — I872 Venous insufficiency (chronic) (peripheral): Secondary | ICD-10-CM | POA: Diagnosis not present

## 2023-06-07 DIAGNOSIS — N1832 Chronic kidney disease, stage 3b: Secondary | ICD-10-CM | POA: Diagnosis not present

## 2023-06-07 DIAGNOSIS — G589 Mononeuropathy, unspecified: Secondary | ICD-10-CM

## 2023-06-07 DIAGNOSIS — I82411 Acute embolism and thrombosis of right femoral vein: Secondary | ICD-10-CM | POA: Diagnosis not present

## 2023-06-07 DIAGNOSIS — L03116 Cellulitis of left lower limb: Secondary | ICD-10-CM | POA: Diagnosis not present

## 2023-06-07 MED ORDER — OXYCODONE-ACETAMINOPHEN 10-325 MG PO TABS
1.0000 | ORAL_TABLET | Freq: Four times a day (QID) | ORAL | 0 refills | Status: DC | PRN
Start: 2023-06-07 — End: 2023-07-07

## 2023-06-07 NOTE — Telephone Encounter (Signed)
 Copied from CRM [id number]. Topic: Clinical - Medication Refill >> Jun 07, 2023  9:15 AM Bobbye Morton wrote: Most Recent Primary Care Visit:  Provider: Malva Limes  Department[id number]  Visit Type: OFFICE VISIT  Date: 04/01/2023  Medication: oxyCODONE-acetaminophen (PERCOCET) 10-325 MG tablet   Has the patient contacted their pharmacy? No (Agent: If no, request that the patient contact the pharmacy for the refill. If patient does not wish to contact the pharmacy document the reason why and proceed with request.) (Agent: If yes, when and what did the pharmacy advise?)  Is this the correct pharmacy for this prescription? Yes If no, delete pharmacy and type the correct one.  This is the patient's preferred pharmacy:  [facility] - [place], [place] - [address] [place] [address] Phone: [phone] Fax: [phone]  Has the prescription been filled recently? Yes  Is the patient out of the medication? Yes, pt has 2 pills left, pt has been taking more medicaiton due to pain.   Has the patient been seen for an appointment in the last year OR does the patient have an upcoming appointment? Yes  Can we respond through MyChart? No  Agent: Please be advised that Rx refills may take up to 3 business days. We ask that you follow-up with your pharmacy.

## 2023-06-07 NOTE — Telephone Encounter (Signed)
 Patient aware of wound clinic appointment tomorrow at [facility].   Patient requesting refill on Oxycodone-acetaminophen [facility]. Patient requesting for this prescription to always go to [facility] please.

## 2023-06-07 NOTE — Telephone Encounter (Signed)
 Copied from CRM [id number]. Topic: General - Call Back - No Documentation >> Jun 07, 2023 12:21 PM Jerry Mcpherson wrote: Reason for CRM: Patient is demanding to speak with Dr. Sherrie Mustache in regards to filling his oxyCODONE-acetaminophen (PERCOCET) 10-325 MG tablet. A request was put in this morning, 2/25 around 9am. Advised patient that it takes up to 3 business days for a refill. Patient states he needs his meds today. Callback #: [phone]

## 2023-06-08 ENCOUNTER — Encounter: Payer: Medicare PPO | Attending: Physician Assistant | Admitting: Physician Assistant

## 2023-06-08 ENCOUNTER — Telehealth: Payer: Self-pay

## 2023-06-08 DIAGNOSIS — S91302A Unspecified open wound, left foot, initial encounter: Secondary | ICD-10-CM

## 2023-06-08 DIAGNOSIS — L03032 Cellulitis of left toe: Secondary | ICD-10-CM

## 2023-06-08 DIAGNOSIS — I129 Hypertensive chronic kidney disease with stage 1 through stage 4 chronic kidney disease, or unspecified chronic kidney disease: Secondary | ICD-10-CM | POA: Insufficient documentation

## 2023-06-08 DIAGNOSIS — Z8551 Personal history of malignant neoplasm of bladder: Secondary | ICD-10-CM | POA: Insufficient documentation

## 2023-06-08 DIAGNOSIS — N189 Chronic kidney disease, unspecified: Secondary | ICD-10-CM | POA: Diagnosis not present

## 2023-06-08 DIAGNOSIS — I739 Peripheral vascular disease, unspecified: Secondary | ICD-10-CM | POA: Insufficient documentation

## 2023-06-08 DIAGNOSIS — L97522 Non-pressure chronic ulcer of other part of left foot with fat layer exposed: Secondary | ICD-10-CM | POA: Diagnosis not present

## 2023-06-08 DIAGNOSIS — I70245 Atherosclerosis of native arteries of left leg with ulceration of other part of foot: Secondary | ICD-10-CM | POA: Diagnosis not present

## 2023-06-08 MED ORDER — HYDROCODONE-ACETAMINOPHEN 10-325 MG PO TABS: 1.0000 | ORAL_TABLET | Freq: Four times a day (QID) | ORAL | 0 refills | Status: AC | PRN

## 2023-06-08 NOTE — Telephone Encounter (Signed)
 Pt states he has run out of his pain medication (oxycodone).  Pt states he needs another Rx for pain.  Pt states the tylenol is not working.  The pharmacy told the pt he cannot pick up his Oxy until next Wed, 3/05. Pt is asking for something else, not as strong as the Oxy, to get him through another week.  He says his toes hurt so bad at night he cannot sleep. Please advise. He said he needs something he can be able to drive and take.  Pt states he is in [place] now and hopes Dr Sherrie Mustache can send it in now.

## 2023-06-08 NOTE — Telephone Encounter (Signed)
 Copied from CRM [id number]. Topic: Clinical - Medication Question >> Jun 08, 2023 12:38 PM Victorino Dike T wrote: Reason for CRM: Dr at the wound healing center recommends a different pain medication to last for two weeks until they find out what is wrong with his toes and told him to go through his primary doctor, please call patient [phone]

## 2023-06-10 DIAGNOSIS — D45 Polycythemia vera: Secondary | ICD-10-CM | POA: Diagnosis not present

## 2023-06-10 DIAGNOSIS — N1832 Chronic kidney disease, stage 3b: Secondary | ICD-10-CM | POA: Diagnosis not present

## 2023-06-10 DIAGNOSIS — I739 Peripheral vascular disease, unspecified: Secondary | ICD-10-CM | POA: Diagnosis not present

## 2023-06-10 DIAGNOSIS — L03116 Cellulitis of left lower limb: Secondary | ICD-10-CM | POA: Diagnosis not present

## 2023-06-10 DIAGNOSIS — I872 Venous insufficiency (chronic) (peripheral): Secondary | ICD-10-CM | POA: Diagnosis not present

## 2023-06-10 DIAGNOSIS — I82411 Acute embolism and thrombosis of right femoral vein: Secondary | ICD-10-CM | POA: Diagnosis not present

## 2023-06-10 DIAGNOSIS — I129 Hypertensive chronic kidney disease with stage 1 through stage 4 chronic kidney disease, or unspecified chronic kidney disease: Secondary | ICD-10-CM | POA: Diagnosis not present

## 2023-06-10 DIAGNOSIS — D751 Secondary polycythemia: Secondary | ICD-10-CM | POA: Diagnosis not present

## 2023-06-10 DIAGNOSIS — I4892 Unspecified atrial flutter: Secondary | ICD-10-CM | POA: Diagnosis not present

## 2023-06-13 DIAGNOSIS — L03116 Cellulitis of left lower limb: Secondary | ICD-10-CM | POA: Diagnosis not present

## 2023-06-13 DIAGNOSIS — I129 Hypertensive chronic kidney disease with stage 1 through stage 4 chronic kidney disease, or unspecified chronic kidney disease: Secondary | ICD-10-CM | POA: Diagnosis not present

## 2023-06-13 DIAGNOSIS — N1832 Chronic kidney disease, stage 3b: Secondary | ICD-10-CM | POA: Diagnosis not present

## 2023-06-13 DIAGNOSIS — I872 Venous insufficiency (chronic) (peripheral): Secondary | ICD-10-CM | POA: Diagnosis not present

## 2023-06-13 DIAGNOSIS — I82411 Acute embolism and thrombosis of right femoral vein: Secondary | ICD-10-CM | POA: Diagnosis not present

## 2023-06-13 DIAGNOSIS — I4892 Unspecified atrial flutter: Secondary | ICD-10-CM | POA: Diagnosis not present

## 2023-06-13 DIAGNOSIS — D45 Polycythemia vera: Secondary | ICD-10-CM | POA: Diagnosis not present

## 2023-06-13 DIAGNOSIS — D751 Secondary polycythemia: Secondary | ICD-10-CM | POA: Diagnosis not present

## 2023-06-13 DIAGNOSIS — I739 Peripheral vascular disease, unspecified: Secondary | ICD-10-CM | POA: Diagnosis not present

## 2023-06-15 DIAGNOSIS — I82411 Acute embolism and thrombosis of right femoral vein: Secondary | ICD-10-CM | POA: Diagnosis not present

## 2023-06-15 DIAGNOSIS — N1832 Chronic kidney disease, stage 3b: Secondary | ICD-10-CM | POA: Diagnosis not present

## 2023-06-15 DIAGNOSIS — I739 Peripheral vascular disease, unspecified: Secondary | ICD-10-CM | POA: Diagnosis not present

## 2023-06-15 DIAGNOSIS — I872 Venous insufficiency (chronic) (peripheral): Secondary | ICD-10-CM | POA: Diagnosis not present

## 2023-06-15 DIAGNOSIS — I129 Hypertensive chronic kidney disease with stage 1 through stage 4 chronic kidney disease, or unspecified chronic kidney disease: Secondary | ICD-10-CM | POA: Diagnosis not present

## 2023-06-15 DIAGNOSIS — I4892 Unspecified atrial flutter: Secondary | ICD-10-CM | POA: Diagnosis not present

## 2023-06-15 DIAGNOSIS — L03116 Cellulitis of left lower limb: Secondary | ICD-10-CM | POA: Diagnosis not present

## 2023-06-15 DIAGNOSIS — D45 Polycythemia vera: Secondary | ICD-10-CM | POA: Diagnosis not present

## 2023-06-15 DIAGNOSIS — D751 Secondary polycythemia: Secondary | ICD-10-CM | POA: Diagnosis not present

## 2023-06-18 DIAGNOSIS — I872 Venous insufficiency (chronic) (peripheral): Secondary | ICD-10-CM | POA: Diagnosis not present

## 2023-06-18 DIAGNOSIS — D45 Polycythemia vera: Secondary | ICD-10-CM | POA: Diagnosis not present

## 2023-06-18 DIAGNOSIS — I4892 Unspecified atrial flutter: Secondary | ICD-10-CM | POA: Diagnosis not present

## 2023-06-18 DIAGNOSIS — L03116 Cellulitis of left lower limb: Secondary | ICD-10-CM | POA: Diagnosis not present

## 2023-06-18 DIAGNOSIS — N1832 Chronic kidney disease, stage 3b: Secondary | ICD-10-CM | POA: Diagnosis not present

## 2023-06-18 DIAGNOSIS — D751 Secondary polycythemia: Secondary | ICD-10-CM | POA: Diagnosis not present

## 2023-06-18 DIAGNOSIS — I739 Peripheral vascular disease, unspecified: Secondary | ICD-10-CM | POA: Diagnosis not present

## 2023-06-18 DIAGNOSIS — I129 Hypertensive chronic kidney disease with stage 1 through stage 4 chronic kidney disease, or unspecified chronic kidney disease: Secondary | ICD-10-CM | POA: Diagnosis not present

## 2023-06-18 DIAGNOSIS — I82411 Acute embolism and thrombosis of right femoral vein: Secondary | ICD-10-CM | POA: Diagnosis not present

## 2023-06-22 ENCOUNTER — Encounter: Payer: Medicare PPO | Attending: Physician Assistant | Admitting: Physician Assistant

## 2023-06-22 DIAGNOSIS — I7389 Other specified peripheral vascular diseases: Secondary | ICD-10-CM | POA: Insufficient documentation

## 2023-06-22 DIAGNOSIS — I129 Hypertensive chronic kidney disease with stage 1 through stage 4 chronic kidney disease, or unspecified chronic kidney disease: Secondary | ICD-10-CM | POA: Diagnosis not present

## 2023-06-22 DIAGNOSIS — N189 Chronic kidney disease, unspecified: Secondary | ICD-10-CM | POA: Insufficient documentation

## 2023-06-22 DIAGNOSIS — L03116 Cellulitis of left lower limb: Secondary | ICD-10-CM | POA: Diagnosis not present

## 2023-06-22 DIAGNOSIS — L97522 Non-pressure chronic ulcer of other part of left foot with fat layer exposed: Secondary | ICD-10-CM | POA: Insufficient documentation

## 2023-06-22 DIAGNOSIS — N1832 Chronic kidney disease, stage 3b: Secondary | ICD-10-CM | POA: Diagnosis not present

## 2023-06-22 DIAGNOSIS — I4892 Unspecified atrial flutter: Secondary | ICD-10-CM | POA: Diagnosis not present

## 2023-06-22 DIAGNOSIS — D45 Polycythemia vera: Secondary | ICD-10-CM | POA: Diagnosis not present

## 2023-06-22 DIAGNOSIS — I872 Venous insufficiency (chronic) (peripheral): Secondary | ICD-10-CM | POA: Diagnosis not present

## 2023-06-22 DIAGNOSIS — D751 Secondary polycythemia: Secondary | ICD-10-CM | POA: Diagnosis not present

## 2023-06-22 DIAGNOSIS — I82411 Acute embolism and thrombosis of right femoral vein: Secondary | ICD-10-CM | POA: Diagnosis not present

## 2023-06-22 DIAGNOSIS — I739 Peripheral vascular disease, unspecified: Secondary | ICD-10-CM | POA: Diagnosis not present

## 2023-06-24 DIAGNOSIS — N1832 Chronic kidney disease, stage 3b: Secondary | ICD-10-CM | POA: Diagnosis not present

## 2023-06-24 DIAGNOSIS — D751 Secondary polycythemia: Secondary | ICD-10-CM | POA: Diagnosis not present

## 2023-06-24 DIAGNOSIS — I82411 Acute embolism and thrombosis of right femoral vein: Secondary | ICD-10-CM | POA: Diagnosis not present

## 2023-06-24 DIAGNOSIS — D45 Polycythemia vera: Secondary | ICD-10-CM | POA: Diagnosis not present

## 2023-06-24 DIAGNOSIS — L03116 Cellulitis of left lower limb: Secondary | ICD-10-CM | POA: Diagnosis not present

## 2023-06-24 DIAGNOSIS — I739 Peripheral vascular disease, unspecified: Secondary | ICD-10-CM | POA: Diagnosis not present

## 2023-06-24 DIAGNOSIS — I129 Hypertensive chronic kidney disease with stage 1 through stage 4 chronic kidney disease, or unspecified chronic kidney disease: Secondary | ICD-10-CM | POA: Diagnosis not present

## 2023-06-24 DIAGNOSIS — I4892 Unspecified atrial flutter: Secondary | ICD-10-CM | POA: Diagnosis not present

## 2023-06-24 DIAGNOSIS — I872 Venous insufficiency (chronic) (peripheral): Secondary | ICD-10-CM | POA: Diagnosis not present

## 2023-06-27 DIAGNOSIS — L03116 Cellulitis of left lower limb: Secondary | ICD-10-CM | POA: Diagnosis not present

## 2023-06-27 DIAGNOSIS — D751 Secondary polycythemia: Secondary | ICD-10-CM | POA: Diagnosis not present

## 2023-06-27 DIAGNOSIS — I4892 Unspecified atrial flutter: Secondary | ICD-10-CM | POA: Diagnosis not present

## 2023-06-27 DIAGNOSIS — I872 Venous insufficiency (chronic) (peripheral): Secondary | ICD-10-CM | POA: Diagnosis not present

## 2023-06-27 DIAGNOSIS — I82411 Acute embolism and thrombosis of right femoral vein: Secondary | ICD-10-CM | POA: Diagnosis not present

## 2023-06-27 DIAGNOSIS — N1832 Chronic kidney disease, stage 3b: Secondary | ICD-10-CM | POA: Diagnosis not present

## 2023-06-27 DIAGNOSIS — I129 Hypertensive chronic kidney disease with stage 1 through stage 4 chronic kidney disease, or unspecified chronic kidney disease: Secondary | ICD-10-CM | POA: Diagnosis not present

## 2023-06-27 DIAGNOSIS — D45 Polycythemia vera: Secondary | ICD-10-CM | POA: Diagnosis not present

## 2023-06-27 DIAGNOSIS — I739 Peripheral vascular disease, unspecified: Secondary | ICD-10-CM | POA: Diagnosis not present

## 2023-06-28 ENCOUNTER — Ambulatory Visit: Payer: Self-pay | Admitting: Family Medicine

## 2023-06-28 ENCOUNTER — Ambulatory Visit
Admission: EM | Admit: 2023-06-28 | Discharge: 2023-06-28 | Disposition: A | Discharge status: Home / Self Care | Attending: Emergency Medicine | Admitting: Emergency Medicine

## 2023-06-28 DIAGNOSIS — T3695XA Adverse effect of unspecified systemic antibiotic, initial encounter: Secondary | ICD-10-CM

## 2023-06-28 DIAGNOSIS — L568 Other specified acute skin changes due to ultraviolet radiation: Secondary | ICD-10-CM

## 2023-06-28 MED ORDER — TRIAMCINOLONE ACETONIDE 0.025 % EX CREA: 1.0000 | TOPICAL_CREAM | Freq: Two times a day (BID) | CUTANEOUS | 0 refills | Status: DC

## 2023-06-28 NOTE — Discharge Instructions (Signed)
 Try CeraVe or Goldbond lotion as often as you want, especially after washing hands.  Wash hands with a mild soap.  wear gloves when you go out into the sun, and we can try some triamcinolone 0.025 %cream for pain and swelling.  Apply this twice a day as needed.  Call your doctor to discuss changing antibiotics for your foot

## 2023-06-28 NOTE — ED Triage Notes (Signed)
 Pt c/o bilateral hand redness,itching & burning x3 days. Denies any new foods,lotions or soaps.

## 2023-06-28 NOTE — ED Provider Notes (Signed)
 HPI  SUBJECTIVE:  Jerry Mcpherson is a 83 y.o. male who presents with constant burning pain and erythema along the dorsum of his bilateral hands starting 3 days ago.  He denies change in his baseline sun exposure, but he has been on doxycycline for the past 2 weeks for a foot infection.  No new lotions, soaps, detergents, hand sanitizer, exposure to chemicals.  No peeling, swelling.  He tried suntan lotion after they became red and painful, cold water.  The cold water helps.  Symptoms are worse with going out into the sun.  He has a past medical history of right DVT on Eliquis and Plavix, bladder cancer, hypertension, atrial flutter, peripheral arterial disease, polycythemia vera.  PCP: In [facility].  Past Medical History:  Diagnosis Date   Acute deep vein thrombosis (DVT) of right femoral vein (HCC) 10/28/2020   [place] 10/24/2020 [facility] Acute right lower extremity DVT extending from the common femoral vein to  the popliteal vein.     Bladder cancer (HCC)    Genital herpes    Hyperglycemia    Hypertension    Squamous cell carcinoma     Past Surgical History:  Procedure Laterality Date   CATARACT EXTRACTION Left 10/2012   Dr. Sharman Crate; [facility]   CYSTECTOMY W/ URETEROILEAL CONDUIT  07/24/2020   [facility] for Stage IV bladder cancer extending to prostate   EYE SURGERY Left    removed a piece of steel   FEMORAL ARTERY STENT Left 06/16/2022   [facility] stenting at [facility]   HERNIA REPAIR  12/08/2006   umbilical hernia, incarderated; Dr. Lemar Livings   PROSTATECTOMY  4/14/2-22   [facility] for Stage IV bladder cancer extending to prostate   ROTATOR CUFF REPAIR     SQUAMOUS CELL CARCINOMA EXCISION  2008   TONSILLECTOMY      Family History  Problem Relation Age of Onset   Hypertension Mother    Heart attack Mother    Stroke Father    Cancer Father    COPD Sister    Congestive Heart Failure Brother     Social History   Tobacco Use   Smoking status: Former    Current packs/day: 0.00     Average packs/day: 3.0 packs/day for 15.0 years (45.0 ttl pk-yrs)    Types: Cigarettes    Start date: 04/12/1953    Quit date: 04/12/1968    Years since quitting: 55.2   Smokeless tobacco: Never  Vaping Use   Vaping status: Never Used  Substance Use Topics   Alcohol use: No    Alcohol/week: 0.0 standard drinks of alcohol   Drug use: No    No current facility-administered medications for this encounter.  Current Outpatient Medications:    amLODipine (NORVASC) 2.5 MG tablet, TAKE ONE (1) TABLET BY MOUTH ONCE DAILY, Disp: 90 tablet, Rfl: 4   Ascorbic Acid (VITAMIN C) 1000 MG tablet, Take 2 tablets by mouth daily., Disp: , Rfl:    aspirin 81 MG tablet, Take 2 tablets daily, Disp: , Rfl:    budesonide-formoterol (SYMBICORT) 160-4.5 MCG/ACT inhaler, Inhale 2 puffs into the lungs 2 (two) times daily., Disp: 1 Inhaler, Rfl: 0   clopidogrel (PLAVIX) 75 MG tablet, Take 75 mg by mouth daily., Disp: , Rfl:    cyproheptadine (PERIACTIN) 4 MG tablet, TAKE 1/2-1 TABLET BY MOUTH THREE TIMES DAILY AS NEEDED TO HELP APPETITE, Disp: 60 tablet, Rfl: 3   diclofenac Sodium (VOLTAREN) 1 % GEL, Apply 4 g topically 4 (four) times daily as needed.,  Disp: 100 g, Rfl: 5   docusate sodium (COLACE) 100 MG capsule, Take 1 capsule by mouth daily as needed., Disp: , Rfl:    doxycycline (VIBRAMYCIN) 100 MG capsule, TAKE 1 CAPSULE BY MOUTH TWICE A DAY X 14 DAYS, Disp: , Rfl:    ELIQUIS 5 MG TABS tablet, Take 1 tablet (5 mg total) by mouth 2 (two) times daily., Disp: 180 tablet, Rfl: 3   fluticasone (FLONASE) 50 MCG/ACT nasal spray, Place 2 sprays into both nostrils daily., Disp: 16 g, Rfl: 6   furosemide (LASIX) 20 MG tablet, Take 1 tablet by mouth daily., Disp: , Rfl:    hydrochlorothiazide (HYDRODIURIL) 25 MG tablet, TAKE 1 TABLET BY MOUTH EVERY DAY, Disp: 90 tablet, Rfl: 1   hydroxyurea (HYDREA) 500 MG capsule, Take 500 mg by mouth as directed. 2 tablets 3 days a week, and 1 tablet all other days, Disp: , Rfl:     levothyroxine (SYNTHROID) 75 MCG tablet, TAKE 1 TABLET BY MOUTH EVERY DAY, Disp: 90 tablet, Rfl: 1   metoprolol succinate (TOPROL-XL) 50 MG 24 hr tablet, Take 1 tablet (50 mg total) by mouth daily., Disp: 90 tablet, Rfl: 2   Multiple Vitamins-Minerals (MULTIVITAMIN ADULT PO), Take 1 tablet by mouth daily., Disp: , Rfl:    mupirocin cream (BACTROBAN) 2 %, Apply 1 Application topically 2 (two) times daily., Disp: 30 g, Rfl: 5   naproxen (NAPROSYN) 500 MG tablet, TAKE 1 TABLET(500 MG) BY MOUTH TWICE DAILY WITH A MEAL, Disp: 30 tablet, Rfl: 5   oxyCODONE-acetaminophen (PERCOCET) 10-325 MG tablet, Take 1 tablet by mouth every 6 (six) hours as needed. for pain, Disp: 120 tablet, Rfl: 0   potassium chloride (KLOR-CON) 10 MEQ tablet, Take by mouth., Disp: , Rfl:    promethazine (PHENERGAN) 12.5 MG tablet, TAKE 1 TO 2 TABLETS(12.5 TO 25 MG) BY MOUTH EVERY 8 HOURS AS NEEDED FOR NAUSEA OR VOMITING, Disp: 12 tablet, Rfl: 2   sildenafil (VIAGRA) 50 MG tablet, Take 0.5-1 tablets by mouth daily as needed., Disp: , Rfl:    triamcinolone (KENALOG) 0.025 % cream, Apply 1 Application topically 2 (two) times daily., Disp: 30 g, Rfl: 0   valACYclovir (VALTREX) 1000 MG tablet, TAKE 1 TABLET BY MOUTH TWICE A DAY AS NEEDED, Disp: 15 tablet, Rfl: 4  Allergies  Allergen Reactions   Losartan Potassium     angioedema Other reaction(s): Angioedema   Shellfish Allergy Swelling and Rash    Rash, itching, swelling   Currently eats SHELLFISH without symptoms   Ultram  [Tramadol Hcl] Nausea And Vomiting   Celecoxib Itching, Swelling, Rash and Other (See Comments)    Cannot take when eating shellfish   Codeine Palpitations    Heart racing   Cyclobenzaprine Other (See Comments)    Musculoskeletal therapy agents cause Excessive sedation Musculoskeletal therapy agents cause excessive sedation   Tramadol Hcl Nausea And Vomiting     ROS  As noted in HPI.   Physical Exam  BP (!) 148/84 (BP Location: Left Arm)    Pulse 70   Temp 98 F (36.7 C) (Oral)   Resp 16   Ht 5\' 10"  (1.778 m)   Wt 70.8 kg   SpO2 98%   BMI 22.38 kg/m   Constitutional: Well developed, well nourished, no acute distress Eyes:  EOMI, conjunctiva normal bilaterally HENT: Normocephalic, atraumatic,mucus membranes moist Respiratory: Normal inspiratory effort Cardiovascular: Normal rate GI: nondistended skin: Tender blanchable erythema with mild edema over the dorsum of both of his hands.  Musculoskeletal: no deformities Neurologic: Alert & oriented x 3, no focal neuro deficits Psychiatric: Speech and behavior appropriate   ED Course   Medications - No data to display  No orders of the defined types were placed in this encounter.   No results found for this or any previous visit (from the past 24 hours). No results found.  ED Clinical Impression  1. Photodermatitis   2. Adverse reaction to antibiotic      ED Assessment/Plan     Suspect a photodermatitis from the doxycycline.  Doubt cellulitis.  Will have him start CeraVe or Goldbond lotion, advised for him to wear gloves when he goes out into the sun, we can try some triamcinolone 0.025 %cream.  He will discuss changing antibiotics for his foot with his PCP.  Discussed  MDM, treatment plan, and plan for follow-up with patient.patient agrees with plan.   Meds ordered this encounter  Medications   triamcinolone (KENALOG) 0.025 % cream    Sig: Apply 1 Application topically 2 (two) times daily.    Dispense:  30 g    Refill:  0      *This clinic note was created using Scientist, clinical (histocompatibility and immunogenetics). Therefore, there may be occasional mistakes despite careful proofreading.  ?    Domenick Gong, MD 06/30/23 1351

## 2023-06-28 NOTE — Telephone Encounter (Signed)
 Copied from CRM [id number]. Topic: Clinical - Red Word Triage >> Jun 28, 2023  3:32 PM Nyra Capes wrote: Red Word that prompted transfer to Nurse Triage: patient calling in patient is taking medication for toe infection, now his hands are red and blistered.   Chief Complaint:   Symptoms: Reddened, Burning, Stinging, Photosensitivity  Frequency: Last 3 Days  Pertinent Negatives: Patient denies fever, drainage,   Disposition: [] ED /[x] Urgent Care (no appt availability in office) / [] Appointment(In office/virtual)/ []  [place] [facility]/ [] Home Care/ [] Refused Recommended Disposition /[] [place] [facility]/ []  Follow-up with PCP  Additional Notes: GA is being triaged for blisters that are new in onset. The patient  believes this is due to his new medication. The patient describes the hands as a dry, flat, blistered, reddened area that started small and extended across the course of 3 days. The patient also stated his hands elicit extreme pain when exposed to the sun. Medication history reveals the patient has been taking Doxycycline BID. Provided education regarding photosensitivity while on this medication. Patient verbalized understanding. No in office availability, referred patient to nearest Urgent Care for evaluation and treatment. Patient verbalized understanding, agreed to disposition.   Reason for Disposition  [1] Looks infected (spreading redness, red streak, pus) AND [2] no fever  Answer Assessment - Initial Assessment Questions 1. APPEARANCE of BLISTER: "What does it look like?"     Peeling, Flat and Dry in appearance  2. SIZE: "How large is the blister?" (inches, cm or compare to coins)     Multiple dry, flat, lesions on both hands  3. LOCATION: "Where are the blisters located?"      Both hands  4. WHEN: "When did the blister happen?"     3 Days  5. CAUSE: "What do you think caused the blister?"     Antibiotic   6. PAIN: "Does it hurt?" If Yes, ask: "How bad is the  pain?"  (Scale 1-10; or mild, moderate, severe)     8  7. OTHER SYMPTOMS: "Do you have any other symptoms?" (e.g., fever)     No  Protocols used: Blister - Foot and Hand-A-AH

## 2023-06-29 ENCOUNTER — Ambulatory Visit: Payer: Self-pay | Admitting: Family Medicine

## 2023-06-29 NOTE — Telephone Encounter (Signed)
 Copied from CRM [id number]. Topic: Clinical - Medical Advice >> Jun 29, 2023 10:16 AM Priscille Loveless wrote: Reason for CRM: Pt had virtual visit yesterday and they told him the antibiotic that he is on will cause his hands to burn and to discontinue and they gave him a hand cream to use for it and stopped the antibiotic. He is calling and wanting to know should he go get it and use it.   Patient calling stating he was seen at Urgent Care yesterday for blisters on his hands. Patient was told to stop taking his antibiotic and was prescribed a cream for his hands. Patient calling to ask if he should get it filled and use it. I advised him that the doctor at urgent care prescribed it for a reason and that he should use it. Patient understood and is in agreement with this plan.   Patient wanted Dr. Sherrie Mustache to be aware that he stopped taking his antibiotic. Patient has an appointment with wound care tomorrow and will advise them of the same.    Reason for Disposition  Caller has medicine question, adult has minor symptoms, caller declines triage, AND triager answers question  Answer Assessment - Initial Assessment Questions 1. NAME of MEDICINE: "What medicine(s) are you calling about?"     triamcinolone  2. QUESTION: "What is your question?" (e.g., double dose of medicine, side effect)     "Should I take it" 3. PRESCRIBER: "Who prescribed the medicine?" Reason: if prescribed by specialist, call should be referred to that group.     Dr. Chaney Malling  Protocols used: Medication Question Call-A-AH

## 2023-06-29 NOTE — Telephone Encounter (Signed)
 Chief Complaint: Burning Rash Symptoms: pain Frequency: When triamcinalone is applied Pertinent Negatives: Patient denies worsening redness/rash Disposition: [] ED /[] Urgent Care (no appt availability in office) / [] Appointment(In office/virtual)/ []  [place] [facility]/ [] Home Care/ [] Refused Recommended Disposition /[] [place] [facility]/ [x]  Follow-up with PCP Additional Notes: Pt reports the prescription medication prescribed at UC is causing the pain to be 9/10 burning when applied. Pt is requesting alternative recommendations from provider. This RN educated pt on home care, new-worsening symptoms, when to call back/seek emergent care. Pt verbalized understanding and agrees to plan.    Copied from CRM [id number]. Topic: Clinical - Red Word Triage >> Jun 29, 2023  1:52 PM Elle L wrote: Red Word that prompted transfer to Nurse Triage: The patient states that he went to the Hospital and they advised him to stop taking his antibiotic and prescribed triamcinolone acetonide. However, he just applied it and now it is burning and painful. Reason for Disposition  [1] Applying cream or ointment AND [2] causes severe itch, burning or pain  Answer Assessment - Initial Assessment Questions 2. LOCATION: "Where is the rash located?"      Back of both hands  5. ONSET: "When did the rash start?"      Pt was seen at [facility] yesterday  7. PAIN: "Does the rash hurt?" If Yes, ask: "How bad is the pain?"  (Scale 0-10; or none, mild, moderate, severe)    - NONE (0): no pain    - MILD (1-3): doesn't interfere with normal activities     - MODERATE (4-7): interferes with normal activities or awakens from sleep     - SEVERE (8-10): excruciating pain, unable to do any normal activities     8/10 8. OTHER SYMPTOMS: "Do you have any other symptoms?" (e.g., fever)     Burning with medication application  Protocols used: Rash or Redness - Localized-A-AH

## 2023-06-30 ENCOUNTER — Encounter: Admitting: Physician Assistant

## 2023-06-30 ENCOUNTER — Telehealth: Payer: Self-pay

## 2023-06-30 DIAGNOSIS — I7389 Other specified peripheral vascular diseases: Secondary | ICD-10-CM | POA: Diagnosis not present

## 2023-06-30 DIAGNOSIS — L309 Dermatitis, unspecified: Secondary | ICD-10-CM

## 2023-06-30 MED ORDER — SILVER SULFADIAZINE 1 % EX CREA: 1.0000 | TOPICAL_CREAM | Freq: Two times a day (BID) | CUTANEOUS | 0 refills | Status: DC

## 2023-06-30 NOTE — Telephone Encounter (Signed)
 NA not able to LM-if patient calls back [facility] for E2C2 to give patient provider's message.

## 2023-06-30 NOTE — Telephone Encounter (Signed)
 Copied from CRM [id number]. Topic: Clinical - Medical Advice >> Jun 29, 2023  3:00 PM Tiffany B wrote: Reason for CRM: Patient calling back checking on the status of medication alternative. Caller was informed PCP is off today and states he needs to hear back from someone today due to the medication burning him after he applies.

## 2023-06-30 NOTE — Telephone Encounter (Signed)
 Can try silvadene cream. Have sent in prescription. He should also keep the back of his hands wrapped with an OTC Telfa pad to protect the skin.

## 2023-07-01 DIAGNOSIS — I872 Venous insufficiency (chronic) (peripheral): Secondary | ICD-10-CM | POA: Diagnosis not present

## 2023-07-01 DIAGNOSIS — I82411 Acute embolism and thrombosis of right femoral vein: Secondary | ICD-10-CM | POA: Diagnosis not present

## 2023-07-01 DIAGNOSIS — D751 Secondary polycythemia: Secondary | ICD-10-CM | POA: Diagnosis not present

## 2023-07-01 DIAGNOSIS — I4892 Unspecified atrial flutter: Secondary | ICD-10-CM | POA: Diagnosis not present

## 2023-07-01 DIAGNOSIS — I739 Peripheral vascular disease, unspecified: Secondary | ICD-10-CM | POA: Diagnosis not present

## 2023-07-01 DIAGNOSIS — N1832 Chronic kidney disease, stage 3b: Secondary | ICD-10-CM | POA: Diagnosis not present

## 2023-07-01 DIAGNOSIS — L03116 Cellulitis of left lower limb: Secondary | ICD-10-CM | POA: Diagnosis not present

## 2023-07-01 DIAGNOSIS — I129 Hypertensive chronic kidney disease with stage 1 through stage 4 chronic kidney disease, or unspecified chronic kidney disease: Secondary | ICD-10-CM | POA: Diagnosis not present

## 2023-07-01 DIAGNOSIS — D45 Polycythemia vera: Secondary | ICD-10-CM | POA: Diagnosis not present

## 2023-07-04 DIAGNOSIS — D751 Secondary polycythemia: Secondary | ICD-10-CM | POA: Diagnosis not present

## 2023-07-04 DIAGNOSIS — D45 Polycythemia vera: Secondary | ICD-10-CM | POA: Diagnosis not present

## 2023-07-04 DIAGNOSIS — N1832 Chronic kidney disease, stage 3b: Secondary | ICD-10-CM | POA: Diagnosis not present

## 2023-07-04 DIAGNOSIS — I82411 Acute embolism and thrombosis of right femoral vein: Secondary | ICD-10-CM | POA: Diagnosis not present

## 2023-07-04 DIAGNOSIS — I4892 Unspecified atrial flutter: Secondary | ICD-10-CM | POA: Diagnosis not present

## 2023-07-04 DIAGNOSIS — L03116 Cellulitis of left lower limb: Secondary | ICD-10-CM | POA: Diagnosis not present

## 2023-07-04 DIAGNOSIS — I872 Venous insufficiency (chronic) (peripheral): Secondary | ICD-10-CM | POA: Diagnosis not present

## 2023-07-04 DIAGNOSIS — I739 Peripheral vascular disease, unspecified: Secondary | ICD-10-CM | POA: Diagnosis not present

## 2023-07-04 DIAGNOSIS — I129 Hypertensive chronic kidney disease with stage 1 through stage 4 chronic kidney disease, or unspecified chronic kidney disease: Secondary | ICD-10-CM | POA: Diagnosis not present

## 2023-07-04 NOTE — Telephone Encounter (Signed)
 Patient reports that the silvadene cream made didn't work and use a cream and his hand is better

## 2023-07-06 ENCOUNTER — Ambulatory Visit: Payer: Medicare PPO | Admitting: Physician Assistant

## 2023-07-07 ENCOUNTER — Other Ambulatory Visit: Payer: Self-pay | Admitting: Family Medicine

## 2023-07-07 DIAGNOSIS — M5410 Radiculopathy, site unspecified: Secondary | ICD-10-CM

## 2023-07-07 DIAGNOSIS — D45 Polycythemia vera: Secondary | ICD-10-CM | POA: Diagnosis not present

## 2023-07-07 DIAGNOSIS — G589 Mononeuropathy, unspecified: Secondary | ICD-10-CM

## 2023-07-07 DIAGNOSIS — D751 Secondary polycythemia: Secondary | ICD-10-CM | POA: Diagnosis not present

## 2023-07-07 DIAGNOSIS — I129 Hypertensive chronic kidney disease with stage 1 through stage 4 chronic kidney disease, or unspecified chronic kidney disease: Secondary | ICD-10-CM | POA: Diagnosis not present

## 2023-07-07 DIAGNOSIS — I82411 Acute embolism and thrombosis of right femoral vein: Secondary | ICD-10-CM | POA: Diagnosis not present

## 2023-07-07 DIAGNOSIS — I739 Peripheral vascular disease, unspecified: Secondary | ICD-10-CM | POA: Diagnosis not present

## 2023-07-07 DIAGNOSIS — I872 Venous insufficiency (chronic) (peripheral): Secondary | ICD-10-CM | POA: Diagnosis not present

## 2023-07-07 DIAGNOSIS — L03116 Cellulitis of left lower limb: Secondary | ICD-10-CM | POA: Diagnosis not present

## 2023-07-07 DIAGNOSIS — N1832 Chronic kidney disease, stage 3b: Secondary | ICD-10-CM | POA: Diagnosis not present

## 2023-07-07 DIAGNOSIS — I4892 Unspecified atrial flutter: Secondary | ICD-10-CM | POA: Diagnosis not present

## 2023-07-07 NOTE — Telephone Encounter (Signed)
 Copied from CRM [id number]. Topic: Clinical - Medication Refill >> Jul 07, 2023  1:28 PM Shelah Lewandowsky wrote: Most Recent Primary Care Visit:  Provider: Malva Limes  Department: [facility]  Visit Type: OFFICE VISIT  Date: 05/30/2023  Medication: oxyCODONE-acetaminophen (PERCOCET) 10-325 MG tablet  Has the patient contacted their pharmacy? No (Agent: If no, request that the patient contact the pharmacy for the refill. If patient does not wish to contact the pharmacy document the reason why and proceed with request.) (Agent: If yes, when and what did the pharmacy advise?)  Is this the correct pharmacy for this prescription? Yes If no, delete pharmacy and type the correct one.  This is the patient's preferred pharmacy:  [facility] - [place], [place] - [address] [place] [address] Phone: [phone] Fax: [phone]   Has the prescription been filled recently? Yes  Is the patient out of the medication? No  Has the patient been seen for an appointment in the last year OR does the patient have an upcoming appointment? Yes  Can we respond through MyChart? No  Agent: Please be advised that Rx refills may take up to 3 business days. We ask that you follow-up with your pharmacy.

## 2023-07-07 NOTE — Telephone Encounter (Signed)
 Requested medication (s) are due for refill today: Yes  Requested medication (s) are on the active medication list: Yes  Last refill:  06/07/23  Future visit scheduled: Yes  Notes to clinic:  Unable to refill per protocol, cannot delegate.      Requested Prescriptions  Pending Prescriptions Disp Refills   oxyCODONE-acetaminophen (PERCOCET) 10-325 MG tablet 120 tablet 0    Sig: Take 1 tablet by mouth every 6 (six) hours as needed. for pain     Not Delegated - Analgesics:  Opioid Agonist Combinations Failed - 07/07/2023  4:22 PM      Failed - This refill cannot be delegated      Failed - Urine Drug Screen completed in last 360 days      Passed - Valid encounter within last 3 months    Recent Outpatient Visits           1 month ago Cellulitis of toe of left foot   [facility] Malva Limes, MD       Future Appointments             In 1 month Fisher, Demetrios Isaacs, MD [facility]

## 2023-07-08 MED ORDER — OXYCODONE-ACETAMINOPHEN 10-325 MG PO TABS: 1.0000 | ORAL_TABLET | Freq: Four times a day (QID) | ORAL | 0 refills | Status: DC | PRN

## 2023-07-11 DIAGNOSIS — I872 Venous insufficiency (chronic) (peripheral): Secondary | ICD-10-CM | POA: Diagnosis not present

## 2023-07-11 DIAGNOSIS — D45 Polycythemia vera: Secondary | ICD-10-CM | POA: Diagnosis not present

## 2023-07-11 DIAGNOSIS — I82411 Acute embolism and thrombosis of right femoral vein: Secondary | ICD-10-CM | POA: Diagnosis not present

## 2023-07-11 DIAGNOSIS — D751 Secondary polycythemia: Secondary | ICD-10-CM | POA: Diagnosis not present

## 2023-07-11 DIAGNOSIS — L03116 Cellulitis of left lower limb: Secondary | ICD-10-CM | POA: Diagnosis not present

## 2023-07-11 DIAGNOSIS — I739 Peripheral vascular disease, unspecified: Secondary | ICD-10-CM | POA: Diagnosis not present

## 2023-07-11 DIAGNOSIS — N1832 Chronic kidney disease, stage 3b: Secondary | ICD-10-CM | POA: Diagnosis not present

## 2023-07-11 DIAGNOSIS — I4892 Unspecified atrial flutter: Secondary | ICD-10-CM | POA: Diagnosis not present

## 2023-07-11 DIAGNOSIS — I129 Hypertensive chronic kidney disease with stage 1 through stage 4 chronic kidney disease, or unspecified chronic kidney disease: Secondary | ICD-10-CM | POA: Diagnosis not present

## 2023-07-13 ENCOUNTER — Ambulatory Visit (INDEPENDENT_AMBULATORY_CARE_PROVIDER_SITE_OTHER): Admitting: Family Medicine

## 2023-07-13 VITALS — BP 161/99 | HR 75 | Temp 97.5°F | Ht 70.0 in | Wt 153.0 lb

## 2023-07-13 DIAGNOSIS — M25561 Pain in right knee: Secondary | ICD-10-CM | POA: Diagnosis not present

## 2023-07-13 DIAGNOSIS — I129 Hypertensive chronic kidney disease with stage 1 through stage 4 chronic kidney disease, or unspecified chronic kidney disease: Secondary | ICD-10-CM | POA: Diagnosis not present

## 2023-07-13 DIAGNOSIS — I4892 Unspecified atrial flutter: Secondary | ICD-10-CM | POA: Diagnosis not present

## 2023-07-13 DIAGNOSIS — N1832 Chronic kidney disease, stage 3b: Secondary | ICD-10-CM | POA: Diagnosis not present

## 2023-07-13 DIAGNOSIS — L309 Dermatitis, unspecified: Secondary | ICD-10-CM | POA: Diagnosis not present

## 2023-07-13 DIAGNOSIS — M25562 Pain in left knee: Secondary | ICD-10-CM

## 2023-07-13 DIAGNOSIS — D751 Secondary polycythemia: Secondary | ICD-10-CM | POA: Diagnosis not present

## 2023-07-13 DIAGNOSIS — L03116 Cellulitis of left lower limb: Secondary | ICD-10-CM | POA: Diagnosis not present

## 2023-07-13 DIAGNOSIS — G8929 Other chronic pain: Secondary | ICD-10-CM

## 2023-07-13 DIAGNOSIS — I82411 Acute embolism and thrombosis of right femoral vein: Secondary | ICD-10-CM | POA: Diagnosis not present

## 2023-07-13 DIAGNOSIS — I739 Peripheral vascular disease, unspecified: Secondary | ICD-10-CM | POA: Diagnosis not present

## 2023-07-13 DIAGNOSIS — S91302A Unspecified open wound, left foot, initial encounter: Secondary | ICD-10-CM | POA: Diagnosis not present

## 2023-07-13 DIAGNOSIS — I872 Venous insufficiency (chronic) (peripheral): Secondary | ICD-10-CM | POA: Diagnosis not present

## 2023-07-13 DIAGNOSIS — D45 Polycythemia vera: Secondary | ICD-10-CM | POA: Diagnosis not present

## 2023-07-13 NOTE — Progress Notes (Signed)
 Established patient visit   Patient: Jerry Mcpherson   DOB: 1940-07-24   83 y.o. Male  MRN: [id number] Visit Date: 07/13/2023  Today's healthcare provider: Mila Merry, MD   Chief Complaint  Patient presents with   Medication Management    Patient presents for follow up of chronic pain to continue opioid therapy   Subjective    Discussed the use of AI scribe software for clinical note transcription with the patient, who gave verbal consent to proceed.  History of Present Illness   Jerry Mcpherson is an 83 year old male who presents for follow up of red swollen hand and wound of left toe. He has been going to wound clinic since last visit and home health changing dressings. he has been taken off antibiotics and has follow up at wound clinic tomorrow.   He has issues with his hands, which were previously red, blistered, and painful. He was seen at [facility] 2 weeks ago and prescribed triamcinolone 0.025% cream but states it caused his hands to burn. He called here a few days later and prescribed silvadene and advised to apply telfa pads and states his hands have felt been much better.   He requires pain medication for ongoing management and is concerned about running out. He ensures not to drive while taking his pain medication, which he picks up from [employer].  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       Medications: Outpatient Medications Prior to Visit  Medication Sig   amLODipine (NORVASC) 2.5 MG tablet TAKE ONE (1) TABLET BY MOUTH ONCE DAILY   Ascorbic Acid (VITAMIN C) 1000 MG tablet Take 2 tablets by mouth daily.   aspirin 81 MG tablet Take 2 tablets daily   budesonide-formoterol (SYMBICORT) 160-4.5 MCG/ACT inhaler Inhale 2 puffs into the lungs 2 (two) times daily.   clopidogrel (PLAVIX) 75 MG tablet Take 75 mg by mouth daily.   cyproheptadine (PERIACTIN) 4 MG tablet TAKE 1/2-1 TABLET BY MOUTH  THREE TIMES DAILY AS NEEDED TO HELP APPETITE   diclofenac Sodium (VOLTAREN) 1 % GEL Apply 4 g topically 4 (four) times daily as needed.   docusate sodium (COLACE) 100 MG capsule Take 1 capsule by mouth daily as needed.   doxycycline (VIBRAMYCIN) 100 MG capsule TAKE 1 CAPSULE BY MOUTH TWICE A DAY X 14 DAYS   ELIQUIS 5 MG TABS tablet Take 1 tablet (5 mg total) by mouth 2 (two) times daily.   fluticasone (FLONASE) 50 MCG/ACT nasal spray Place 2 sprays into both nostrils daily.   furosemide (LASIX) 20 MG tablet Take 1 tablet by mouth daily.   hydrochlorothiazide (HYDRODIURIL) 25 MG tablet TAKE 1 TABLET BY MOUTH EVERY DAY   hydroxyurea (HYDREA) 500 MG capsule Take 500 mg by mouth as directed. 2 tablets 3 days a week, and 1 tablet all other days   levothyroxine (SYNTHROID) 75 MCG tablet TAKE 1 TABLET BY MOUTH EVERY DAY   metoprolol succinate (TOPROL-XL) 50 MG 24 hr tablet Take 1 tablet (50 mg total) by mouth daily.   Multiple Vitamins-Minerals (MULTIVITAMIN ADULT PO) Take 1 tablet by mouth daily.   mupirocin cream (BACTROBAN) 2 % Apply 1 Application topically 2 (two) times daily.   naproxen (NAPROSYN) 500 MG tablet TAKE 1 TABLET(500 MG) BY MOUTH TWICE DAILY WITH A MEAL   oxyCODONE-acetaminophen (PERCOCET) 10-325 MG tablet Take 1 tablet by mouth every 6 (six) hours  as needed. for pain   potassium chloride (KLOR-CON) 10 MEQ tablet Take by mouth.   promethazine (PHENERGAN) 12.5 MG tablet TAKE 1 TO 2 TABLETS(12.5 TO 25 MG) BY MOUTH EVERY 8 HOURS AS NEEDED FOR NAUSEA OR VOMITING   sildenafil (VIAGRA) 50 MG tablet Take 0.5-1 tablets by mouth daily as needed.   silver sulfADIAZINE (SILVADENE) 1 % cream Apply 1 Application topically 2 (two) times daily.   triamcinolone (KENALOG) 0.025 % cream Apply 1 Application topically 2 (two) times daily.   valACYclovir (VALTREX) 1000 MG tablet TAKE 1 TABLET BY MOUTH TWICE A DAY AS NEEDED   No facility-administered medications prior to visit.     Objective    BP (!)  161/99 (BP Location: Left Arm, Patient Position: Sitting, Cuff Size: Normal)   Pulse 75   Temp (!) 97.5 F (36.4 C) (Oral)   Ht 5\' 10"  (1.778 m)   Wt 153 lb (69.4 kg)   SpO2 99%   BMI 21.95 kg/m   Physical Exam      Assessment & Plan        Toe wound Wound improved, antibiotics discontinued. - Continue regular dressing changes by visiting nurse.  Dermatitis Dermatitis on hands improved with Silvadene cream. Stopped triamciniolone prescribed at from Urgent Care due to burning after applying it. Etiology unclear. Additional skin reactions on forearms. Dermatology referral planned. - Continue Silvadene cream application as needed. - Refer to dermatologist for further evaluation and management.  Chronic pain management Ongoing pain management required. Aware of medication effects and avoids driving while medicated. - Send prescription for pain medication to pharmacy for pickup. - Schedule follow-up appointment in three months to reassess pain management.    Return in about 3 months (around 10/12/2023).      Mila Merry, MD  [facility] [phone] (phone) [phone] (fax)  [facility]

## 2023-07-13 NOTE — Patient Instructions (Signed)
 Marland Kitchen  Please review the attached list of medications and notify my office if there are any errors.   . Please bring all of your medications to every appointment so we can make sure that our medication list is the same as yours.

## 2023-07-14 ENCOUNTER — Encounter: Attending: Physician Assistant | Admitting: Physician Assistant

## 2023-07-14 DIAGNOSIS — I7389 Other specified peripheral vascular diseases: Secondary | ICD-10-CM | POA: Diagnosis present

## 2023-07-14 DIAGNOSIS — N189 Chronic kidney disease, unspecified: Secondary | ICD-10-CM | POA: Diagnosis not present

## 2023-07-14 DIAGNOSIS — L97522 Non-pressure chronic ulcer of other part of left foot with fat layer exposed: Secondary | ICD-10-CM | POA: Diagnosis not present

## 2023-07-14 DIAGNOSIS — I129 Hypertensive chronic kidney disease with stage 1 through stage 4 chronic kidney disease, or unspecified chronic kidney disease: Secondary | ICD-10-CM | POA: Insufficient documentation

## 2023-07-18 DIAGNOSIS — D45 Polycythemia vera: Secondary | ICD-10-CM | POA: Diagnosis not present

## 2023-07-18 DIAGNOSIS — D751 Secondary polycythemia: Secondary | ICD-10-CM | POA: Diagnosis not present

## 2023-07-18 DIAGNOSIS — I129 Hypertensive chronic kidney disease with stage 1 through stage 4 chronic kidney disease, or unspecified chronic kidney disease: Secondary | ICD-10-CM | POA: Diagnosis not present

## 2023-07-18 DIAGNOSIS — I872 Venous insufficiency (chronic) (peripheral): Secondary | ICD-10-CM | POA: Diagnosis not present

## 2023-07-18 DIAGNOSIS — L03116 Cellulitis of left lower limb: Secondary | ICD-10-CM | POA: Diagnosis not present

## 2023-07-18 DIAGNOSIS — I4892 Unspecified atrial flutter: Secondary | ICD-10-CM | POA: Diagnosis not present

## 2023-07-18 DIAGNOSIS — I82411 Acute embolism and thrombosis of right femoral vein: Secondary | ICD-10-CM | POA: Diagnosis not present

## 2023-07-18 DIAGNOSIS — I739 Peripheral vascular disease, unspecified: Secondary | ICD-10-CM | POA: Diagnosis not present

## 2023-07-18 DIAGNOSIS — N1832 Chronic kidney disease, stage 3b: Secondary | ICD-10-CM | POA: Diagnosis not present

## 2023-07-20 ENCOUNTER — Telehealth: Payer: Self-pay | Admitting: Family Medicine

## 2023-07-20 DIAGNOSIS — L309 Dermatitis, unspecified: Secondary | ICD-10-CM

## 2023-07-20 NOTE — Telephone Encounter (Signed)
 Copied from CRM [id number]. Topic: Referral - Status >> Jul 19, 2023  4:45 PM DeAngela L wrote: Reason for CRM: Karleen Hampshire calling with [facility] [phone] states Patient will would not be able to schedule with this office at this time after the patient was not so nice to an employee in the office, and the office is willing to suggest another location but not able to schedule with him. The Office is grateful for the referral, please reach out if the physician has any question Karleen Hampshire suggest [facility] takes the patients insurance.  Also Karleen Hampshire states if the patient would like to speak with the office he is happy to talk him and not pass this off on another office

## 2023-07-21 DIAGNOSIS — D45 Polycythemia vera: Secondary | ICD-10-CM | POA: Diagnosis not present

## 2023-07-21 DIAGNOSIS — I4892 Unspecified atrial flutter: Secondary | ICD-10-CM | POA: Diagnosis not present

## 2023-07-21 DIAGNOSIS — D751 Secondary polycythemia: Secondary | ICD-10-CM | POA: Diagnosis not present

## 2023-07-21 DIAGNOSIS — I82411 Acute embolism and thrombosis of right femoral vein: Secondary | ICD-10-CM | POA: Diagnosis not present

## 2023-07-21 DIAGNOSIS — N1832 Chronic kidney disease, stage 3b: Secondary | ICD-10-CM | POA: Diagnosis not present

## 2023-07-21 DIAGNOSIS — I129 Hypertensive chronic kidney disease with stage 1 through stage 4 chronic kidney disease, or unspecified chronic kidney disease: Secondary | ICD-10-CM | POA: Diagnosis not present

## 2023-07-21 DIAGNOSIS — I739 Peripheral vascular disease, unspecified: Secondary | ICD-10-CM | POA: Diagnosis not present

## 2023-07-21 DIAGNOSIS — I872 Venous insufficiency (chronic) (peripheral): Secondary | ICD-10-CM | POA: Diagnosis not present

## 2023-07-21 DIAGNOSIS — L03116 Cellulitis of left lower limb: Secondary | ICD-10-CM | POA: Diagnosis not present

## 2023-07-21 NOTE — Addendum Note (Signed)
 Addended by: Malva Limes on: 07/21/2023 08:16 AM   Modules accepted: Orders

## 2023-07-25 DIAGNOSIS — I739 Peripheral vascular disease, unspecified: Secondary | ICD-10-CM | POA: Diagnosis not present

## 2023-07-25 DIAGNOSIS — L03116 Cellulitis of left lower limb: Secondary | ICD-10-CM | POA: Diagnosis not present

## 2023-07-25 DIAGNOSIS — I82411 Acute embolism and thrombosis of right femoral vein: Secondary | ICD-10-CM | POA: Diagnosis not present

## 2023-07-25 DIAGNOSIS — I4892 Unspecified atrial flutter: Secondary | ICD-10-CM | POA: Diagnosis not present

## 2023-07-25 DIAGNOSIS — D751 Secondary polycythemia: Secondary | ICD-10-CM | POA: Diagnosis not present

## 2023-07-25 DIAGNOSIS — I872 Venous insufficiency (chronic) (peripheral): Secondary | ICD-10-CM | POA: Diagnosis not present

## 2023-07-25 DIAGNOSIS — I129 Hypertensive chronic kidney disease with stage 1 through stage 4 chronic kidney disease, or unspecified chronic kidney disease: Secondary | ICD-10-CM | POA: Diagnosis not present

## 2023-07-25 DIAGNOSIS — N1832 Chronic kidney disease, stage 3b: Secondary | ICD-10-CM | POA: Diagnosis not present

## 2023-07-25 DIAGNOSIS — D45 Polycythemia vera: Secondary | ICD-10-CM | POA: Diagnosis not present

## 2023-07-26 ENCOUNTER — Telehealth: Payer: Self-pay | Admitting: Family Medicine

## 2023-07-26 NOTE — Telephone Encounter (Signed)
 [facility] faxed certification paperwork in 07/25/23, front office portion completed and placed in provider mailbox

## 2023-07-27 DIAGNOSIS — E039 Hypothyroidism, unspecified: Secondary | ICD-10-CM

## 2023-07-27 DIAGNOSIS — L03116 Cellulitis of left lower limb: Secondary | ICD-10-CM | POA: Diagnosis not present

## 2023-07-27 DIAGNOSIS — I872 Venous insufficiency (chronic) (peripheral): Secondary | ICD-10-CM

## 2023-07-27 DIAGNOSIS — H9193 Unspecified hearing loss, bilateral: Secondary | ICD-10-CM

## 2023-07-27 DIAGNOSIS — D72829 Elevated white blood cell count, unspecified: Secondary | ICD-10-CM

## 2023-07-27 DIAGNOSIS — I82411 Acute embolism and thrombosis of right femoral vein: Secondary | ICD-10-CM | POA: Diagnosis not present

## 2023-07-27 DIAGNOSIS — I4892 Unspecified atrial flutter: Secondary | ICD-10-CM

## 2023-07-27 DIAGNOSIS — N1832 Chronic kidney disease, stage 3b: Secondary | ICD-10-CM

## 2023-07-27 DIAGNOSIS — E785 Hyperlipidemia, unspecified: Secondary | ICD-10-CM

## 2023-07-27 DIAGNOSIS — I129 Hypertensive chronic kidney disease with stage 1 through stage 4 chronic kidney disease, or unspecified chronic kidney disease: Secondary | ICD-10-CM | POA: Diagnosis not present

## 2023-07-27 DIAGNOSIS — D45 Polycythemia vera: Secondary | ICD-10-CM | POA: Diagnosis not present

## 2023-07-27 DIAGNOSIS — I739 Peripheral vascular disease, unspecified: Secondary | ICD-10-CM

## 2023-07-28 ENCOUNTER — Ambulatory Visit: Admitting: Physician Assistant

## 2023-07-28 DIAGNOSIS — I82411 Acute embolism and thrombosis of right femoral vein: Secondary | ICD-10-CM | POA: Diagnosis not present

## 2023-07-28 DIAGNOSIS — N1832 Chronic kidney disease, stage 3b: Secondary | ICD-10-CM | POA: Diagnosis not present

## 2023-07-28 DIAGNOSIS — I739 Peripheral vascular disease, unspecified: Secondary | ICD-10-CM | POA: Diagnosis not present

## 2023-07-28 DIAGNOSIS — D751 Secondary polycythemia: Secondary | ICD-10-CM | POA: Diagnosis not present

## 2023-07-28 DIAGNOSIS — L03116 Cellulitis of left lower limb: Secondary | ICD-10-CM | POA: Diagnosis not present

## 2023-07-28 DIAGNOSIS — I129 Hypertensive chronic kidney disease with stage 1 through stage 4 chronic kidney disease, or unspecified chronic kidney disease: Secondary | ICD-10-CM | POA: Diagnosis not present

## 2023-07-28 DIAGNOSIS — I872 Venous insufficiency (chronic) (peripheral): Secondary | ICD-10-CM | POA: Diagnosis not present

## 2023-07-28 DIAGNOSIS — D45 Polycythemia vera: Secondary | ICD-10-CM | POA: Diagnosis not present

## 2023-07-28 DIAGNOSIS — I4892 Unspecified atrial flutter: Secondary | ICD-10-CM | POA: Diagnosis not present

## 2023-08-01 DIAGNOSIS — D45 Polycythemia vera: Secondary | ICD-10-CM | POA: Diagnosis not present

## 2023-08-01 DIAGNOSIS — I129 Hypertensive chronic kidney disease with stage 1 through stage 4 chronic kidney disease, or unspecified chronic kidney disease: Secondary | ICD-10-CM | POA: Diagnosis not present

## 2023-08-01 DIAGNOSIS — N1832 Chronic kidney disease, stage 3b: Secondary | ICD-10-CM | POA: Diagnosis not present

## 2023-08-01 DIAGNOSIS — I739 Peripheral vascular disease, unspecified: Secondary | ICD-10-CM | POA: Diagnosis not present

## 2023-08-01 DIAGNOSIS — D751 Secondary polycythemia: Secondary | ICD-10-CM | POA: Diagnosis not present

## 2023-08-01 DIAGNOSIS — I82411 Acute embolism and thrombosis of right femoral vein: Secondary | ICD-10-CM | POA: Diagnosis not present

## 2023-08-01 DIAGNOSIS — I872 Venous insufficiency (chronic) (peripheral): Secondary | ICD-10-CM | POA: Diagnosis not present

## 2023-08-01 DIAGNOSIS — L03116 Cellulitis of left lower limb: Secondary | ICD-10-CM | POA: Diagnosis not present

## 2023-08-01 DIAGNOSIS — I4892 Unspecified atrial flutter: Secondary | ICD-10-CM | POA: Diagnosis not present

## 2023-08-03 ENCOUNTER — Other Ambulatory Visit: Payer: Self-pay | Admitting: Family Medicine

## 2023-08-03 DIAGNOSIS — G589 Mononeuropathy, unspecified: Secondary | ICD-10-CM

## 2023-08-03 DIAGNOSIS — M5410 Radiculopathy, site unspecified: Secondary | ICD-10-CM

## 2023-08-03 NOTE — Telephone Encounter (Signed)
 Copied from CRM [id number]. Topic: Clinical - Medication Question >> Aug 03, 2023  4:33 PM [name] wrote: Reason for CRM: patient is wanting his refill and he is wondering what is taking to long he stated he call twice today about the OxyCODONE 

## 2023-08-03 NOTE — Telephone Encounter (Signed)
 Copied from CRM [id number]. Topic: Clinical - Medication Refill >> Aug 03, 2023 10:09 AM Emylou G wrote: Most Recent Primary Care Visit:  Provider: Lamon Pillow  Department: [facility]  Visit Type: OFFICE VISIT  Date: 07/13/2023  Medication: oxyCODONE -acetaminophen  (PERCOCET) 10-325 MG tablet  Has the patient contacted their pharmacy? No (Agent: If no, request that the patient contact the pharmacy for the refill. If patient does not wish to contact the pharmacy document the reason why and proceed with request.) (Agent: If yes, when and what did the pharmacy advise?)  Is this the correct pharmacy for this prescription? Yes If no, delete pharmacy and type the correct one.  This is the patient's preferred pharmacy:  [facility] - [place], [place] - [address] [place] [address] Phone: [phone] Fax: [phone]   Has the prescription been filled recently? No  Is the patient out of the medication? No  Has the patient been seen for an appointment in the last year OR does the patient have an upcoming appointment? Yes  Can we respond through MyChart? Yes  Agent: Please be advised that Rx refills may take up to 3 business days. We ask that you follow-up with your pharmacy.

## 2023-08-03 NOTE — Telephone Encounter (Signed)
 Copied from CRM [id number]. Topic: Clinical - Medication Refill >> Aug 03, 2023  3:05 PM Carlatta H wrote: Most Recent Primary Care Visit:  Provider: Lamon Pillow  Department: [facility]  Visit Type: OFFICE VISIT  Date: 07/13/2023  Medication: oxyCODONE -acetaminophen  (PERCOCET) 10-325 MG tablet [[id number]  Has the patient contacted their pharmacy? No (Agent: If no, request that the patient contact the pharmacy for the refill. If patient does not wish to contact the pharmacy document the reason why and proceed with request.) (Agent: If yes, when and what did the pharmacy advise?)  Is this the correct pharmacy for this prescription? Yes If no, delete pharmacy and type the correct one.  This is the patient's preferred pharmacy:  [facility] - [place], [place] - [address] [place] [address] Phone: [phone] Fax: [phone]   Has the prescription been filled recently? No  Is the patient out of the medication? Yes  Has the patient been seen for an appointment in the last year OR does the patient have an upcoming appointment? Yes  Can we respond through MyChart? Yes  Agent: Please be advised that Rx refills may take up to 3 business days. We ask that you follow-up with your pharmacy.

## 2023-08-04 DIAGNOSIS — N1832 Chronic kidney disease, stage 3b: Secondary | ICD-10-CM | POA: Diagnosis not present

## 2023-08-04 DIAGNOSIS — D45 Polycythemia vera: Secondary | ICD-10-CM | POA: Diagnosis not present

## 2023-08-04 DIAGNOSIS — I739 Peripheral vascular disease, unspecified: Secondary | ICD-10-CM | POA: Diagnosis not present

## 2023-08-04 DIAGNOSIS — I82411 Acute embolism and thrombosis of right femoral vein: Secondary | ICD-10-CM | POA: Diagnosis not present

## 2023-08-04 DIAGNOSIS — I4892 Unspecified atrial flutter: Secondary | ICD-10-CM | POA: Diagnosis not present

## 2023-08-04 DIAGNOSIS — D751 Secondary polycythemia: Secondary | ICD-10-CM | POA: Diagnosis not present

## 2023-08-04 DIAGNOSIS — L03116 Cellulitis of left lower limb: Secondary | ICD-10-CM | POA: Diagnosis not present

## 2023-08-04 DIAGNOSIS — I129 Hypertensive chronic kidney disease with stage 1 through stage 4 chronic kidney disease, or unspecified chronic kidney disease: Secondary | ICD-10-CM | POA: Diagnosis not present

## 2023-08-04 DIAGNOSIS — I872 Venous insufficiency (chronic) (peripheral): Secondary | ICD-10-CM | POA: Diagnosis not present

## 2023-08-04 NOTE — Telephone Encounter (Signed)
 NT not delegated to refuse this med request. Last RF 08/03/23 #120 Have to route back to office to refuse

## 2023-08-05 ENCOUNTER — Encounter: Admitting: Physician Assistant

## 2023-08-05 ENCOUNTER — Telehealth: Payer: Self-pay

## 2023-08-05 DIAGNOSIS — I7389 Other specified peripheral vascular diseases: Secondary | ICD-10-CM | POA: Diagnosis not present

## 2023-08-05 NOTE — Telephone Encounter (Signed)
 Copied from CRM [id number]. Topic: Clinical - Medical Advice >> Aug 05, 2023  3:32 PM Star East wrote: Reason for CRM: Antony Baumgartner with [facility] [phone]  vitals in normal limits- reporting spontaneous nose bleeds, left calf swollen, no redness or pain- has not gotten the compression socks yet

## 2023-08-09 DIAGNOSIS — I739 Peripheral vascular disease, unspecified: Secondary | ICD-10-CM | POA: Diagnosis not present

## 2023-08-09 DIAGNOSIS — D751 Secondary polycythemia: Secondary | ICD-10-CM | POA: Diagnosis not present

## 2023-08-09 DIAGNOSIS — I82411 Acute embolism and thrombosis of right femoral vein: Secondary | ICD-10-CM | POA: Diagnosis not present

## 2023-08-09 DIAGNOSIS — I129 Hypertensive chronic kidney disease with stage 1 through stage 4 chronic kidney disease, or unspecified chronic kidney disease: Secondary | ICD-10-CM | POA: Diagnosis not present

## 2023-08-09 DIAGNOSIS — D45 Polycythemia vera: Secondary | ICD-10-CM | POA: Diagnosis not present

## 2023-08-09 DIAGNOSIS — L03116 Cellulitis of left lower limb: Secondary | ICD-10-CM | POA: Diagnosis not present

## 2023-08-09 DIAGNOSIS — I872 Venous insufficiency (chronic) (peripheral): Secondary | ICD-10-CM | POA: Diagnosis not present

## 2023-08-09 DIAGNOSIS — N1832 Chronic kidney disease, stage 3b: Secondary | ICD-10-CM | POA: Diagnosis not present

## 2023-08-09 DIAGNOSIS — I4892 Unspecified atrial flutter: Secondary | ICD-10-CM | POA: Diagnosis not present

## 2023-08-10 ENCOUNTER — Ambulatory Visit: Payer: Self-pay

## 2023-08-10 DIAGNOSIS — Z0000 Encounter for general adult medical examination without abnormal findings: Secondary | ICD-10-CM | POA: Diagnosis not present

## 2023-08-10 NOTE — Progress Notes (Signed)
 Subjective:   Jerry Mcpherson is a 83 y.o. who presents for a Medicare Wellness preventive visit.  Visit Complete: Virtual I connected with  Jerry Mcpherson on 08/10/23 by a audio enabled telemedicine application and verified that I am speaking with the correct person using two identifiers.  Patient Location: Home  Provider Location: Home Office  I discussed the limitations of evaluation and management by telemedicine. The patient expressed understanding and agreed to proceed.  Vital Signs: Because this visit was a virtual/telehealth visit, some criteria may be missing or patient reported. Any vitals not documented were not able to be obtained and vitals that have been documented are patient reported.  VideoDeclined- This patient declined Librarian, academic. Therefore the visit was completed with audio only.  Persons Participating in Visit: Patient.  AWV Questionnaire: No: Patient Medicare AWV questionnaire was not completed prior to this visit.  Cardiac Risk Factors include: advanced age (>62men, >80 women);sedentary lifestyle;hypertension;male gender     Objective:    Today's Vitals   08/10/23 1555  PainSc: 4    There is no height or weight on file to calculate BMI.     08/10/2023    4:06 PM 08/09/2022    3:48 PM 07/23/2021    8:52 AM 07/17/2020    9:16 AM 07/16/2019    3:02 PM 08/26/2017   10:10 AM 08/24/2016    1:27 PM  Advanced Directives  Does Patient Have a Medical Advance Directive? No No No No No No No  Would patient like information on creating a medical advance directive? No - Patient declined  No - Patient declined No - Patient declined No - Patient declined No - Patient declined     Current Medications (verified) Outpatient Encounter Medications as of 08/10/2023  Medication Sig   clopidogrel (PLAVIX) 75 MG tablet Take 75 mg by mouth daily.   diclofenac  Sodium (VOLTAREN ) 1 % GEL Apply 4 g topically 4 (four) times daily as  needed.   docusate sodium (COLACE) 100 MG capsule Take 1 capsule by mouth daily as needed.   ELIQUIS  5 MG TABS tablet Take 1 tablet (5 mg total) by mouth 2 (two) times daily.   hydrochlorothiazide  (HYDRODIURIL ) 25 MG tablet TAKE 1 TABLET BY MOUTH EVERY DAY   hydroxyurea (HYDREA) 500 MG capsule Take 500 mg by mouth as directed. 2 tablets 3 days a week, and 1 tablet all other days   metoprolol  succinate (TOPROL -XL) 50 MG 24 hr tablet Take 1 tablet (50 mg total) by mouth daily.   Multiple Vitamins-Minerals (MULTIVITAMIN ADULT PO) Take 1 tablet by mouth daily.   mupirocin  cream (BACTROBAN ) 2 % Apply 1 Application topically 2 (two) times daily.   naproxen  (NAPROSYN ) 500 MG tablet TAKE 1 TABLET(500 MG) BY MOUTH TWICE DAILY WITH A MEAL   oxyCODONE -acetaminophen  (PERCOCET) 10-325 MG tablet TAKE 1 TABLET BY MOUTH EVERY 6 HOURS AS NEEDED FOR PAIN   potassium chloride  (KLOR-CON ) 10 MEQ tablet Take by mouth.   promethazine  (PHENERGAN ) 12.5 MG tablet TAKE 1 TO 2 TABLETS(12.5 TO 25 MG) BY MOUTH EVERY 8 HOURS AS NEEDED FOR NAUSEA OR VOMITING   sildenafil (VIAGRA) 50 MG tablet Take 0.5-1 tablets by mouth daily as needed.   silver  sulfADIAZINE  (SILVADENE ) 1 % cream Apply 1 Application topically 2 (two) times daily.   triamcinolone  (KENALOG ) 0.025 % cream Apply 1 Application topically 2 (two) times daily.   valACYclovir  (VALTREX ) 1000 MG tablet TAKE 1 TABLET BY MOUTH TWICE A DAY AS NEEDED  amLODipine  (NORVASC ) 2.5 MG tablet TAKE ONE (1) TABLET BY MOUTH ONCE DAILY (Patient not taking: Reported on 08/10/2023)   Ascorbic Acid (VITAMIN C) 1000 MG tablet Take 2 tablets by mouth daily. (Patient not taking: Reported on 08/10/2023)   aspirin  81 MG tablet Take 2 tablets daily (Patient not taking: Reported on 08/10/2023)   budesonide -formoterol  (SYMBICORT ) 160-4.5 MCG/ACT inhaler Inhale 2 puffs into the lungs 2 (two) times daily. (Patient not taking: Reported on 08/10/2023)   cyproheptadine  (PERIACTIN ) 4 MG tablet TAKE 1/2-1  TABLET BY MOUTH THREE TIMES DAILY AS NEEDED TO HELP APPETITE (Patient not taking: Reported on 08/10/2023)   doxycycline  (VIBRAMYCIN ) 100 MG capsule TAKE 1 CAPSULE BY MOUTH TWICE A DAY X 14 DAYS (Patient not taking: Reported on 08/10/2023)   fluticasone  (FLONASE ) 50 MCG/ACT nasal spray Place 2 sprays into both nostrils daily. (Patient not taking: Reported on 08/10/2023)   furosemide (LASIX) 20 MG tablet Take 1 tablet by mouth daily.   levothyroxine  (SYNTHROID ) 75 MCG tablet TAKE 1 TABLET BY MOUTH EVERY DAY   No facility-administered encounter medications on file as of 08/10/2023.    Allergies (verified) Losartan potassium, Shellfish allergy, Shellfish-derived products, Doxycycline , Ultram  [tramadol hcl], Celecoxib, Codeine, Cyclobenzaprine, and Tramadol hcl   History: Past Medical History:  Diagnosis Date   Acute deep vein thrombosis (DVT) of right femoral vein (HCC) 10/28/2020   US  10/24/2020 [facility] Acute right lower extremity DVT extending from the common femoral vein to  the popliteal vein.     Bladder cancer (HCC)    Genital herpes    Hyperglycemia    Hypertension    Squamous cell carcinoma    Past Surgical History:  Procedure Laterality Date   CATARACT EXTRACTION Left 10/2012   Dr. Dellia Ferguson; [facility]   CYSTECTOMY W/ URETEROILEAL CONDUIT  07/24/2020   [facility] for Stage IV bladder cancer extending to prostate   EYE SURGERY Left    removed a piece of steel   FEMORAL ARTERY STENT Left 06/16/2022   [facility]   HERNIA REPAIR  12/08/2006   umbilical hernia, incarderated; Dr. Marquita Situ   PROSTATECTOMY  4/14/2-22   [facility] for Stage IV bladder cancer extending to prostate   ROTATOR CUFF REPAIR     SQUAMOUS CELL CARCINOMA EXCISION  2008   TONSILLECTOMY     Family History  Problem Relation Age of Onset   Hypertension Mother    Heart attack Mother    Stroke Father    Cancer Father    COPD Sister    Congestive Heart Failure Brother    Social History    Socioeconomic History   Marital status: Divorced    Spouse name: Not on file   Number of children: 1   Years of education: Not on file   Highest education level: Some college, no degree  Occupational History   Occupation: Retired   Occupation: Sales executive part time  Tobacco Use   Smoking status: Former    Current packs/day: 0.00    Average packs/day: 3.0 packs/day for 15.0 years (45.0 ttl pk-yrs)    Types: Cigarettes    Start date: 04/12/1953    Quit date: 04/12/1968    Years since quitting: 55.3   Smokeless tobacco: Never  Vaping Use   Vaping status: Never Used  Substance and Sexual Activity   Alcohol use: No    Alcohol/week: 0.0 standard drinks of alcohol   Drug use: No   Sexual activity: Not on file  Other Topics Concern  Not on file  Social History Narrative   Not on file   Social Drivers of Health   Financial Resource Strain: Medium Risk (08/10/2023)   Overall Financial Resource Strain (CARDIA)    Difficulty of Paying Living Expenses: Somewhat hard  Food Insecurity: No Food Insecurity (08/10/2023)   Hunger Vital Sign    Worried About Running Out of Food in the Last Year: Never true    Ran Out of Food in the Last Year: Never true  Transportation Needs: No Transportation Needs (08/10/2023)   PRAPARE - Administrator, Civil Service (Medical): No    Lack of Transportation (Non-Medical): No  Physical Activity: Inactive (08/10/2023)   Exercise Vital Sign    Days of Exercise per Week: 0 days    Minutes of Exercise per Session: 0 min  Stress: No Stress Concern Present (08/10/2023)   [employer] of Occupational Health - Occupational Stress Questionnaire    Feeling of Stress : Only a little  Social Connections: Socially Isolated (08/10/2023)   Social Connection and Isolation Panel [NHANES]    Frequency of Communication with Friends and Family: More than three times a week    Frequency of Social Gatherings with Friends and Family: More than three  times a week    Attends Religious Services: Never    Database administrator or Organizations: No    Attends Engineer, structural: Never    Marital Status: Divorced    Tobacco Counseling Counseling given: Not Answered    Clinical Intake:  Pre-visit preparation completed: Yes  Pain : 0-10 Pain Score: 4  Pain Type: Chronic pain Pain Location: Toe (Comment which one) Pain Orientation: Left Pain Descriptors / Indicators: Aching, Burning Pain Onset: More than a month ago Pain Frequency: Constant     BMI - recorded: 22 Nutritional Status: BMI of 19-24  Normal Nutritional Risks: None Diabetes: No  Lab Results  Component Value Date   HGBA1C 5.3 08/18/2020   HGBA1C 5.8 (A) 02/18/2020   HGBA1C 6.0 (A) 08/15/2019     How often do you need to have someone help you when you read instructions, pamphlets, or other written materials from your doctor or pharmacy?: 1 - Never  Interpreter Needed?: No  Information entered by :: Dellie Fergusson, LPN   Activities of Daily Living    08/10/2023    4:07 PM 11/19/2022    1:11 PM  In your present state of health, do you have any difficulty performing the following activities:  Hearing? 1 1  Vision? 0 0  Difficulty concentrating or making decisions? 0 0  Walking or climbing stairs? 1 1  Dressing or bathing? 0 0  Doing errands, shopping? 0 0  Preparing Food and eating ? N   Using the Toilet? N   In the past six months, have you accidently leaked urine? N   Do you have problems with loss of bowel control? N   Managing your Medications? N   Managing your Finances? N   Housekeeping or managing your Housekeeping? N     Patient Care Team: Lamon Pillow, MD as PCP - General (Family Medicine) O'Branski, Talbert Face, PA (Oncology) System, Provider Not In Jamesetta Mcbride, MD (Urology) Hoimes, Marine Sia, DO as Referring Physician (Hematology and Oncology) Rendall, Christiane Cowing, PA (Cardiology) Jerome Moores, MD (Vascular  Surgery)  Indicate any recent Medical Services you may have received from other than [facility] providers in the past year (date may be approximate).  Assessment:   This is a routine wellness examination for Jerry Mcpherson.  Hearing/Vision screen Hearing Screening - Comments:: WEARS AIDS, LEFT EAR Vision Screening - Comments:: WEARS GLASSES ALL DAY- MD IN [place]   Goals Addressed             This Visit's Progress    DIET - INCREASE WATER INTAKE         Depression Screen     08/10/2023    4:03 PM 04/01/2023    2:37 PM 11/19/2022    1:10 PM 09/09/2022    9:41 AM 08/20/2022   11:20 AM 08/09/2022    3:45 PM 07/09/2022    2:48 PM  PHQ 2/9 Scores  PHQ - 2 Score 4 0 0 1 2 0 0  PHQ- 9 Score 6 1 1 3 3  1     Fall Risk     08/10/2023    4:07 PM 04/01/2023    2:38 PM 11/19/2022    1:10 PM 09/09/2022    9:41 AM 08/20/2022   11:21 AM  Fall Risk   Falls in the past year? 0 0 0 0 0  Number falls in past yr: 0 0 0 0 0  Injury with Fall? 0 0 0 0 0  Risk for fall due to : No Fall Risks    No Fall Risks  Follow up Falls prevention discussed;Falls evaluation completed    Falls evaluation completed    MEDICARE RISK AT HOME:  Medicare Risk at Home Any stairs in or around the home?: No If so, are there any without handrails?: No Home free of loose throw rugs in walkways, pet beds, electrical cords, etc?: Yes Adequate lighting in your home to reduce risk of falls?: Yes Life alert?: No Use of a cane, walker or w/c?: No Grab bars in the bathroom?: Yes Shower chair or bench in shower?: Yes Elevated toilet seat or a handicapped toilet?: No  TIMED UP AND GO:  Was the test performed?  No  Cognitive Function: 6CIT completed        08/10/2023    4:09 PM 08/09/2022    3:54 PM 08/26/2017   10:16 AM 08/24/2016    1:28 PM  6CIT Screen  What Year? 0 points 0 points 0 points 0 points  What month? 0 points 0 points 0 points 0 points  What time? 0 points 0 points 0 points 0 points  Count back from  20 0 points 0 points 0 points 0 points  Months in reverse 0 points 0 points 0 points 0 points  Repeat phrase 4 points 2 points 0 points 2 points  Total Score 4 points 2 points 0 points 2 points    Immunizations Immunization History  Administered Date(s) Administered   Fluad Quad(high Dose 65+) 02/02/2019, 01/09/2020, 01/23/2021, 02/05/2022   Influenza Split 01/01/2009   Influenza, High Dose Seasonal PF 12/31/2013, 12/12/2014, 01/28/2016, 02/03/2017, 12/14/2017, 01/25/2023   Influenza,inj,Quad PF,6+ Mos 05/13/2022   Influenza-Unspecified 05/22/2017   Moderna Sars-Covid-2 Vaccination 05/26/2019, 06/23/2019   Pneumococcal Conjugate-13 12/31/2013   Pneumococcal Polysaccharide-23 02/22/2004   Tdap 10/28/2011   Zoster, Live 01/28/2009    Screening Tests Health Maintenance  Topic Date Due   Zoster Vaccines- Shingrix (1 of 2) 09[birth year]19/1959   COVID-19 Vaccine (3 - Moderna risk series) 07/21/2019   DTaP/Tdap/Td (2 - Td or Tdap) 10/27/2021   INFLUENZA VACCINE  11/11/2023   Medicare Annual Wellness (AWV)  08/09/2024   Pneumonia Vaccine 28+ Years old  Completed  HPV VACCINES  Aged Out   Meningococcal B Vaccine  Aged Out    Health Maintenance  Health Maintenance Due  Topic Date Due   Zoster Vaccines- Shingrix (1 of 2) 09[birth year]19/1959   COVID-19 Vaccine (3 - Moderna risk series) 07/21/2019   DTaP/Tdap/Td (2 - Td or Tdap) 10/27/2021   Health Maintenance Items Addressed: UP TO DATE ON SHOTS EXCEPT PNA & TDAP- WANTS NO MORE COVID SHOTS  Additional Screening:  Vision Screening: Recommended annual ophthalmology exams for early detection of glaucoma and other disorders of the eye.  Dental Screening: Recommended annual dental exams for proper oral hygiene  Community Resource Referral / Chronic Care Management: CRR required this visit?  No   CCM required this visit?  No     Plan:     I have personally reviewed and noted the following in the patient's chart:   Medical and social  history Use of alcohol, tobacco or illicit drugs  Current medications and supplements including opioid prescriptions. Patient is currently taking opioid prescriptions. Information provided to patient regarding non-opioid alternatives. Patient advised to discuss non-opioid treatment plan with their provider. Functional ability and status Nutritional status Physical activity Advanced directives List of other physicians Hospitalizations, surgeries, and ER visits in previous 12 months Vitals Screenings to include cognitive, depression, and falls Referrals and appointments  In addition, I have reviewed and discussed with patient certain preventive protocols, quality metrics, and best practice recommendations. A written personalized care plan for preventive services as well as general preventive health recommendations were provided to patient.     Pinky Bright, LPN   1/61/0960   After Visit Summary: (MyChart) Due to this being a telephonic visit, the after visit summary with patients personalized plan was offered to patient via MyChart   Notes: Nothing significant to report at this time.

## 2023-08-10 NOTE — Patient Instructions (Addendum)
 Jerry Mcpherson , Thank you for taking time to come for your Medicare Wellness Visit. I appreciate your ongoing commitment to your health goals. Please review the following plan we discussed and let me know if I can assist you in the future.   Referrals/Orders/Follow-Ups/Clinician Recommendations: NONE  This is a list of the screening recommended for you and due dates:  Health Maintenance  Topic Date Due   Zoster (Shingles) Vaccine (1 of 2) 09[birth year]19/1957   COVID-19 Vaccine (3 - Moderna risk series) 07/21/2019   DTaP/Tdap/Td vaccine (2 - Td or Tdap) 10/27/2021   Flu Shot  11/11/2023   Medicare Annual Wellness Visit  08/09/2024   Pneumonia Vaccine  Completed   HPV Vaccine  Aged Out   Meningitis B Vaccine  Aged Out    Advanced directives: (ACP Link)Information on Advanced Care Planning can be found at [place]  Best boy Advance Health Care Directives Advance Health Care Directives. [email or web address]   Next Medicare Annual Wellness Visit scheduled for next year: Yes 08/15/24 @ 3:50 PM BY PHONE

## 2023-08-11 DIAGNOSIS — D751 Secondary polycythemia: Secondary | ICD-10-CM | POA: Diagnosis not present

## 2023-08-11 DIAGNOSIS — N1832 Chronic kidney disease, stage 3b: Secondary | ICD-10-CM | POA: Diagnosis not present

## 2023-08-11 DIAGNOSIS — I872 Venous insufficiency (chronic) (peripheral): Secondary | ICD-10-CM | POA: Diagnosis not present

## 2023-08-11 DIAGNOSIS — L03116 Cellulitis of left lower limb: Secondary | ICD-10-CM | POA: Diagnosis not present

## 2023-08-11 DIAGNOSIS — I129 Hypertensive chronic kidney disease with stage 1 through stage 4 chronic kidney disease, or unspecified chronic kidney disease: Secondary | ICD-10-CM | POA: Diagnosis not present

## 2023-08-11 DIAGNOSIS — I4892 Unspecified atrial flutter: Secondary | ICD-10-CM | POA: Diagnosis not present

## 2023-08-11 DIAGNOSIS — I82411 Acute embolism and thrombosis of right femoral vein: Secondary | ICD-10-CM | POA: Diagnosis not present

## 2023-08-11 DIAGNOSIS — I739 Peripheral vascular disease, unspecified: Secondary | ICD-10-CM | POA: Diagnosis not present

## 2023-08-11 DIAGNOSIS — D45 Polycythemia vera: Secondary | ICD-10-CM | POA: Diagnosis not present

## 2023-08-14 ENCOUNTER — Other Ambulatory Visit: Payer: Self-pay | Admitting: Family Medicine

## 2023-08-14 DIAGNOSIS — I4892 Unspecified atrial flutter: Secondary | ICD-10-CM

## 2023-08-15 DIAGNOSIS — I4892 Unspecified atrial flutter: Secondary | ICD-10-CM | POA: Diagnosis not present

## 2023-08-15 DIAGNOSIS — I129 Hypertensive chronic kidney disease with stage 1 through stage 4 chronic kidney disease, or unspecified chronic kidney disease: Secondary | ICD-10-CM | POA: Diagnosis not present

## 2023-08-15 DIAGNOSIS — I82411 Acute embolism and thrombosis of right femoral vein: Secondary | ICD-10-CM | POA: Diagnosis not present

## 2023-08-15 DIAGNOSIS — D751 Secondary polycythemia: Secondary | ICD-10-CM | POA: Diagnosis not present

## 2023-08-15 DIAGNOSIS — I872 Venous insufficiency (chronic) (peripheral): Secondary | ICD-10-CM | POA: Diagnosis not present

## 2023-08-15 DIAGNOSIS — D45 Polycythemia vera: Secondary | ICD-10-CM | POA: Diagnosis not present

## 2023-08-15 DIAGNOSIS — N1832 Chronic kidney disease, stage 3b: Secondary | ICD-10-CM | POA: Diagnosis not present

## 2023-08-15 DIAGNOSIS — L03116 Cellulitis of left lower limb: Secondary | ICD-10-CM | POA: Diagnosis not present

## 2023-08-15 DIAGNOSIS — I739 Peripheral vascular disease, unspecified: Secondary | ICD-10-CM | POA: Diagnosis not present

## 2023-08-18 DIAGNOSIS — I739 Peripheral vascular disease, unspecified: Secondary | ICD-10-CM | POA: Diagnosis not present

## 2023-08-18 DIAGNOSIS — N1832 Chronic kidney disease, stage 3b: Secondary | ICD-10-CM | POA: Diagnosis not present

## 2023-08-18 DIAGNOSIS — I4892 Unspecified atrial flutter: Secondary | ICD-10-CM | POA: Diagnosis not present

## 2023-08-18 DIAGNOSIS — L03116 Cellulitis of left lower limb: Secondary | ICD-10-CM | POA: Diagnosis not present

## 2023-08-18 DIAGNOSIS — I129 Hypertensive chronic kidney disease with stage 1 through stage 4 chronic kidney disease, or unspecified chronic kidney disease: Secondary | ICD-10-CM | POA: Diagnosis not present

## 2023-08-18 DIAGNOSIS — D45 Polycythemia vera: Secondary | ICD-10-CM | POA: Diagnosis not present

## 2023-08-18 DIAGNOSIS — D751 Secondary polycythemia: Secondary | ICD-10-CM | POA: Diagnosis not present

## 2023-08-18 DIAGNOSIS — I872 Venous insufficiency (chronic) (peripheral): Secondary | ICD-10-CM | POA: Diagnosis not present

## 2023-08-18 DIAGNOSIS — I82411 Acute embolism and thrombosis of right femoral vein: Secondary | ICD-10-CM | POA: Diagnosis not present

## 2023-08-19 ENCOUNTER — Encounter: Attending: Physician Assistant | Admitting: Physician Assistant

## 2023-08-19 DIAGNOSIS — N189 Chronic kidney disease, unspecified: Secondary | ICD-10-CM | POA: Insufficient documentation

## 2023-08-19 DIAGNOSIS — I129 Hypertensive chronic kidney disease with stage 1 through stage 4 chronic kidney disease, or unspecified chronic kidney disease: Secondary | ICD-10-CM | POA: Diagnosis not present

## 2023-08-19 DIAGNOSIS — I7389 Other specified peripheral vascular diseases: Secondary | ICD-10-CM | POA: Diagnosis present

## 2023-08-19 DIAGNOSIS — I7025 Atherosclerosis of native arteries of other extremities with ulceration: Secondary | ICD-10-CM | POA: Insufficient documentation

## 2023-08-19 DIAGNOSIS — L97522 Non-pressure chronic ulcer of other part of left foot with fat layer exposed: Secondary | ICD-10-CM | POA: Insufficient documentation

## 2023-08-22 DIAGNOSIS — N1832 Chronic kidney disease, stage 3b: Secondary | ICD-10-CM | POA: Diagnosis not present

## 2023-08-22 DIAGNOSIS — I129 Hypertensive chronic kidney disease with stage 1 through stage 4 chronic kidney disease, or unspecified chronic kidney disease: Secondary | ICD-10-CM | POA: Diagnosis not present

## 2023-08-22 DIAGNOSIS — D751 Secondary polycythemia: Secondary | ICD-10-CM | POA: Diagnosis not present

## 2023-08-22 DIAGNOSIS — I82411 Acute embolism and thrombosis of right femoral vein: Secondary | ICD-10-CM | POA: Diagnosis not present

## 2023-08-22 DIAGNOSIS — I4892 Unspecified atrial flutter: Secondary | ICD-10-CM | POA: Diagnosis not present

## 2023-08-22 DIAGNOSIS — D45 Polycythemia vera: Secondary | ICD-10-CM | POA: Diagnosis not present

## 2023-08-22 DIAGNOSIS — L03116 Cellulitis of left lower limb: Secondary | ICD-10-CM | POA: Diagnosis not present

## 2023-08-22 DIAGNOSIS — I739 Peripheral vascular disease, unspecified: Secondary | ICD-10-CM | POA: Diagnosis not present

## 2023-08-22 DIAGNOSIS — I872 Venous insufficiency (chronic) (peripheral): Secondary | ICD-10-CM | POA: Diagnosis not present

## 2023-08-24 DIAGNOSIS — I4892 Unspecified atrial flutter: Secondary | ICD-10-CM | POA: Diagnosis not present

## 2023-08-24 DIAGNOSIS — N1832 Chronic kidney disease, stage 3b: Secondary | ICD-10-CM | POA: Diagnosis not present

## 2023-08-24 DIAGNOSIS — I82411 Acute embolism and thrombosis of right femoral vein: Secondary | ICD-10-CM | POA: Diagnosis not present

## 2023-08-24 DIAGNOSIS — D751 Secondary polycythemia: Secondary | ICD-10-CM | POA: Diagnosis not present

## 2023-08-24 DIAGNOSIS — L03116 Cellulitis of left lower limb: Secondary | ICD-10-CM | POA: Diagnosis not present

## 2023-08-24 DIAGNOSIS — D45 Polycythemia vera: Secondary | ICD-10-CM | POA: Diagnosis not present

## 2023-08-24 DIAGNOSIS — I872 Venous insufficiency (chronic) (peripheral): Secondary | ICD-10-CM | POA: Diagnosis not present

## 2023-08-24 DIAGNOSIS — I739 Peripheral vascular disease, unspecified: Secondary | ICD-10-CM | POA: Diagnosis not present

## 2023-08-24 DIAGNOSIS — I129 Hypertensive chronic kidney disease with stage 1 through stage 4 chronic kidney disease, or unspecified chronic kidney disease: Secondary | ICD-10-CM | POA: Diagnosis not present

## 2023-08-26 ENCOUNTER — Ambulatory Visit: Payer: Medicare PPO | Admitting: Family Medicine

## 2023-08-26 ENCOUNTER — Telehealth: Payer: Self-pay

## 2023-08-26 ENCOUNTER — Other Ambulatory Visit: Payer: Self-pay | Admitting: Family Medicine

## 2023-08-26 DIAGNOSIS — L03116 Cellulitis of left lower limb: Secondary | ICD-10-CM | POA: Diagnosis not present

## 2023-08-26 DIAGNOSIS — I872 Venous insufficiency (chronic) (peripheral): Secondary | ICD-10-CM | POA: Diagnosis not present

## 2023-08-26 DIAGNOSIS — I82411 Acute embolism and thrombosis of right femoral vein: Secondary | ICD-10-CM

## 2023-08-26 DIAGNOSIS — N1832 Chronic kidney disease, stage 3b: Secondary | ICD-10-CM | POA: Diagnosis not present

## 2023-08-26 DIAGNOSIS — I739 Peripheral vascular disease, unspecified: Secondary | ICD-10-CM | POA: Diagnosis not present

## 2023-08-26 DIAGNOSIS — I4892 Unspecified atrial flutter: Secondary | ICD-10-CM | POA: Diagnosis not present

## 2023-08-26 DIAGNOSIS — D751 Secondary polycythemia: Secondary | ICD-10-CM | POA: Diagnosis not present

## 2023-08-26 DIAGNOSIS — I129 Hypertensive chronic kidney disease with stage 1 through stage 4 chronic kidney disease, or unspecified chronic kidney disease: Secondary | ICD-10-CM | POA: Diagnosis not present

## 2023-08-26 DIAGNOSIS — D45 Polycythemia vera: Secondary | ICD-10-CM | POA: Diagnosis not present

## 2023-08-26 NOTE — Telephone Encounter (Signed)
 Copied from CRM [id number]. Topic: Clinical - Medical Advice >> Aug 25, 2023  4:20 PM Yolanda T wrote: Reason for CRM: patient said he took a shower and took his hearing aid out his left ear and after his shower he replaced his hearing aid and tried to answer his phone but he was unable to speak. He took the left hearing aid out and then he was able to speak. He has since been able to hear clearly out of his left ear without the hearing aid. Please f/u with patient

## 2023-08-29 DIAGNOSIS — D45 Polycythemia vera: Secondary | ICD-10-CM | POA: Diagnosis not present

## 2023-08-29 DIAGNOSIS — I129 Hypertensive chronic kidney disease with stage 1 through stage 4 chronic kidney disease, or unspecified chronic kidney disease: Secondary | ICD-10-CM | POA: Diagnosis not present

## 2023-08-29 DIAGNOSIS — I739 Peripheral vascular disease, unspecified: Secondary | ICD-10-CM | POA: Diagnosis not present

## 2023-08-29 DIAGNOSIS — I82411 Acute embolism and thrombosis of right femoral vein: Secondary | ICD-10-CM | POA: Diagnosis not present

## 2023-08-29 DIAGNOSIS — D751 Secondary polycythemia: Secondary | ICD-10-CM | POA: Diagnosis not present

## 2023-08-29 DIAGNOSIS — I4892 Unspecified atrial flutter: Secondary | ICD-10-CM | POA: Diagnosis not present

## 2023-08-29 DIAGNOSIS — N1832 Chronic kidney disease, stage 3b: Secondary | ICD-10-CM | POA: Diagnosis not present

## 2023-08-29 DIAGNOSIS — I872 Venous insufficiency (chronic) (peripheral): Secondary | ICD-10-CM | POA: Diagnosis not present

## 2023-08-29 DIAGNOSIS — L03116 Cellulitis of left lower limb: Secondary | ICD-10-CM | POA: Diagnosis not present

## 2023-09-02 ENCOUNTER — Encounter: Admitting: Physician Assistant

## 2023-09-02 DIAGNOSIS — N1832 Chronic kidney disease, stage 3b: Secondary | ICD-10-CM | POA: Diagnosis not present

## 2023-09-02 DIAGNOSIS — L03116 Cellulitis of left lower limb: Secondary | ICD-10-CM | POA: Diagnosis not present

## 2023-09-02 DIAGNOSIS — D45 Polycythemia vera: Secondary | ICD-10-CM | POA: Diagnosis not present

## 2023-09-02 DIAGNOSIS — D751 Secondary polycythemia: Secondary | ICD-10-CM | POA: Diagnosis not present

## 2023-09-02 DIAGNOSIS — I872 Venous insufficiency (chronic) (peripheral): Secondary | ICD-10-CM | POA: Diagnosis not present

## 2023-09-02 DIAGNOSIS — I4892 Unspecified atrial flutter: Secondary | ICD-10-CM | POA: Diagnosis not present

## 2023-09-02 DIAGNOSIS — L97522 Non-pressure chronic ulcer of other part of left foot with fat layer exposed: Secondary | ICD-10-CM | POA: Diagnosis not present

## 2023-09-02 DIAGNOSIS — I82411 Acute embolism and thrombosis of right femoral vein: Secondary | ICD-10-CM | POA: Diagnosis not present

## 2023-09-02 DIAGNOSIS — I129 Hypertensive chronic kidney disease with stage 1 through stage 4 chronic kidney disease, or unspecified chronic kidney disease: Secondary | ICD-10-CM | POA: Diagnosis not present

## 2023-09-02 DIAGNOSIS — I739 Peripheral vascular disease, unspecified: Secondary | ICD-10-CM | POA: Diagnosis not present

## 2023-09-05 DIAGNOSIS — L03116 Cellulitis of left lower limb: Secondary | ICD-10-CM | POA: Diagnosis not present

## 2023-09-05 DIAGNOSIS — I4892 Unspecified atrial flutter: Secondary | ICD-10-CM | POA: Diagnosis not present

## 2023-09-05 DIAGNOSIS — I872 Venous insufficiency (chronic) (peripheral): Secondary | ICD-10-CM | POA: Diagnosis not present

## 2023-09-05 DIAGNOSIS — D751 Secondary polycythemia: Secondary | ICD-10-CM | POA: Diagnosis not present

## 2023-09-05 DIAGNOSIS — I82411 Acute embolism and thrombosis of right femoral vein: Secondary | ICD-10-CM | POA: Diagnosis not present

## 2023-09-05 DIAGNOSIS — I129 Hypertensive chronic kidney disease with stage 1 through stage 4 chronic kidney disease, or unspecified chronic kidney disease: Secondary | ICD-10-CM | POA: Diagnosis not present

## 2023-09-05 DIAGNOSIS — D45 Polycythemia vera: Secondary | ICD-10-CM | POA: Diagnosis not present

## 2023-09-05 DIAGNOSIS — I739 Peripheral vascular disease, unspecified: Secondary | ICD-10-CM | POA: Diagnosis not present

## 2023-09-05 DIAGNOSIS — N1832 Chronic kidney disease, stage 3b: Secondary | ICD-10-CM | POA: Diagnosis not present

## 2023-09-06 ENCOUNTER — Telehealth: Payer: Self-pay

## 2023-09-06 NOTE — Telephone Encounter (Unsigned)
 Copied from CRM [id number]. Topic: General - Other >> Sep 06, 2023  3:20 PM Rennis Case wrote: Reason for CRM: Coleen Dauer- Adult Protective Services w/ [facility] requesting to speak to Dr. Shann Darnel or Dr. Evans Him nurse. Wanting to speak to provider about patient's cognitive status, unsure if patient is having trouble but did received some reports about patient.   Best call back number: [phone]

## 2023-09-06 NOTE — Telephone Encounter (Signed)
 No, he has not have dementia or any known cognitive disabilities.

## 2023-09-07 DIAGNOSIS — I872 Venous insufficiency (chronic) (peripheral): Secondary | ICD-10-CM | POA: Diagnosis not present

## 2023-09-07 DIAGNOSIS — L03116 Cellulitis of left lower limb: Secondary | ICD-10-CM | POA: Diagnosis not present

## 2023-09-07 DIAGNOSIS — D45 Polycythemia vera: Secondary | ICD-10-CM | POA: Diagnosis not present

## 2023-09-07 DIAGNOSIS — I739 Peripheral vascular disease, unspecified: Secondary | ICD-10-CM | POA: Diagnosis not present

## 2023-09-07 DIAGNOSIS — I129 Hypertensive chronic kidney disease with stage 1 through stage 4 chronic kidney disease, or unspecified chronic kidney disease: Secondary | ICD-10-CM | POA: Diagnosis not present

## 2023-09-07 DIAGNOSIS — N1832 Chronic kidney disease, stage 3b: Secondary | ICD-10-CM | POA: Diagnosis not present

## 2023-09-07 DIAGNOSIS — D751 Secondary polycythemia: Secondary | ICD-10-CM | POA: Diagnosis not present

## 2023-09-07 DIAGNOSIS — I4892 Unspecified atrial flutter: Secondary | ICD-10-CM | POA: Diagnosis not present

## 2023-09-07 DIAGNOSIS — I82411 Acute embolism and thrombosis of right femoral vein: Secondary | ICD-10-CM | POA: Diagnosis not present

## 2023-09-07 NOTE — Telephone Encounter (Signed)
 LVMTCB

## 2023-09-08 ENCOUNTER — Other Ambulatory Visit: Payer: Self-pay

## 2023-09-08 ENCOUNTER — Other Ambulatory Visit: Payer: Self-pay | Admitting: Family Medicine

## 2023-09-08 DIAGNOSIS — G589 Mononeuropathy, unspecified: Secondary | ICD-10-CM

## 2023-09-08 DIAGNOSIS — M5410 Radiculopathy, site unspecified: Secondary | ICD-10-CM

## 2023-09-08 MED ORDER — OXYCODONE-ACETAMINOPHEN 10-325 MG PO TABS: 1.0000 | ORAL_TABLET | Freq: Four times a day (QID) | ORAL | 0 refills | Status: DC | PRN

## 2023-09-08 NOTE — Telephone Encounter (Signed)
 No answer when called, left message for a callback.

## 2023-09-08 NOTE — Telephone Encounter (Signed)
Converted to refill request 

## 2023-09-08 NOTE — Telephone Encounter (Unsigned)
 Copied from CRM [id number]. Topic: Clinical - Medication Refill >> Sep 08, 2023  2:14 PM Tiffany S wrote: Medication: oxyCODONE -acetaminophen  (PERCOCET) 10-325 MG tablet [[id number]]  Has the patient contacted their pharmacy? Yes (Agent: If no, request that the patient contact the pharmacy for the refill. If patient does not wish to contact the pharmacy document the reason why and proceed with request.) (Agent: If yes, when and what did the pharmacy advise?)  This is the patient's preferred pharmacy:  [facility] - [place], [place] - [address] [place] [address] Phone: [phone] Fax: [phone]   Is this the correct pharmacy for this prescription? Yes If no, delete pharmacy and type the correct one.   Has the prescription been filled recently? Yes  Is the patient out of the medication? Yes  Has the patient been seen for an appointment in the last year OR does the patient have an upcoming appointment? Yes  Can we respond through MyChart? Yes  Agent: Please be advised that Rx refills may take up to 3 business days. We ask that you follow-up with your pharmacy.

## 2023-09-08 NOTE — Telephone Encounter (Signed)
 Copied from CRM [id number]. Topic: Clinical - Medication Refill >> Sep 08, 2023  2:14 PM Tiffany S wrote: Medication: oxyCODONE -acetaminophen  (PERCOCET) 10-325 MG tablet [[id number]]  Has the patient contacted their pharmacy? Yes (Agent: If no, request that the patient contact the pharmacy for the refill. If patient does not wish to contact the pharmacy document the reason why and proceed with request.) (Agent: If yes, when and what did the pharmacy advise?)  This is the patient's preferred pharmacy:  [facility] - [place], [place] - [address] [place] [address] Phone: [phone] Fax: [phone]   Is this the correct pharmacy for this prescription? Yes If no, delete pharmacy and type the correct one.   Has the prescription been filled recently? Yes  Is the patient out of the medication? Yes  Has the patient been seen for an appointment in the last year OR does the patient have an upcoming appointment? Yes  Can we respond through MyChart? Yes  Agent: Please be advised that Rx refills may take up to 3 business days. We ask that you follow-up with your pharmacy.

## 2023-09-12 ENCOUNTER — Telehealth: Payer: Self-pay

## 2023-09-12 NOTE — Telephone Encounter (Signed)
 Copied from CRM [id number]. Topic: General - Other >> Sep 12, 2023 12:06 PM Zipporah Him wrote: Reason for CRM: [employer] calling on behalf of this patient. Belives she is trying to reach Jeanita Carneiro. States her name is Coleen Dauer from adult protective services. Please call back when available.

## 2023-09-12 NOTE — Telephone Encounter (Signed)
 Called and spoke with Tonya from [facility]. Gave verbal response regarding her question and provider response, asked if we could faxed over patient medication list along with his diagnosis. Fax number is [phone] attention Tonya. Will fax over problem list and medication list, spoke with Medical Records for verbal ok to do so.

## 2023-09-14 DIAGNOSIS — I4892 Unspecified atrial flutter: Secondary | ICD-10-CM | POA: Diagnosis not present

## 2023-09-14 DIAGNOSIS — D751 Secondary polycythemia: Secondary | ICD-10-CM | POA: Diagnosis not present

## 2023-09-14 DIAGNOSIS — I82411 Acute embolism and thrombosis of right femoral vein: Secondary | ICD-10-CM | POA: Diagnosis not present

## 2023-09-14 DIAGNOSIS — I129 Hypertensive chronic kidney disease with stage 1 through stage 4 chronic kidney disease, or unspecified chronic kidney disease: Secondary | ICD-10-CM | POA: Diagnosis not present

## 2023-09-14 DIAGNOSIS — N1832 Chronic kidney disease, stage 3b: Secondary | ICD-10-CM | POA: Diagnosis not present

## 2023-09-14 DIAGNOSIS — I872 Venous insufficiency (chronic) (peripheral): Secondary | ICD-10-CM | POA: Diagnosis not present

## 2023-09-14 DIAGNOSIS — I739 Peripheral vascular disease, unspecified: Secondary | ICD-10-CM | POA: Diagnosis not present

## 2023-09-14 DIAGNOSIS — D45 Polycythemia vera: Secondary | ICD-10-CM | POA: Diagnosis not present

## 2023-09-14 DIAGNOSIS — L03116 Cellulitis of left lower limb: Secondary | ICD-10-CM | POA: Diagnosis not present

## 2023-09-16 ENCOUNTER — Encounter: Attending: Physician Assistant | Admitting: Physician Assistant

## 2023-09-16 DIAGNOSIS — N189 Chronic kidney disease, unspecified: Secondary | ICD-10-CM | POA: Diagnosis not present

## 2023-09-16 DIAGNOSIS — I129 Hypertensive chronic kidney disease with stage 1 through stage 4 chronic kidney disease, or unspecified chronic kidney disease: Secondary | ICD-10-CM | POA: Insufficient documentation

## 2023-09-16 DIAGNOSIS — I7389 Other specified peripheral vascular diseases: Secondary | ICD-10-CM | POA: Diagnosis not present

## 2023-09-16 DIAGNOSIS — I7025 Atherosclerosis of native arteries of other extremities with ulceration: Secondary | ICD-10-CM | POA: Diagnosis not present

## 2023-09-16 DIAGNOSIS — L97522 Non-pressure chronic ulcer of other part of left foot with fat layer exposed: Secondary | ICD-10-CM | POA: Diagnosis present

## 2023-09-19 ENCOUNTER — Encounter: Payer: Self-pay | Admitting: Family Medicine

## 2023-09-19 ENCOUNTER — Ambulatory Visit: Admitting: Family Medicine

## 2023-09-19 ENCOUNTER — Ambulatory Visit (INDEPENDENT_AMBULATORY_CARE_PROVIDER_SITE_OTHER): Admitting: Family Medicine

## 2023-09-19 VITALS — BP 120/59 | HR 79 | Wt 146.0 lb

## 2023-09-19 DIAGNOSIS — H6123 Impacted cerumen, bilateral: Secondary | ICD-10-CM | POA: Diagnosis not present

## 2023-09-19 NOTE — Progress Notes (Signed)
 Established patient visit   Patient: Jerry Mcpherson   DOB: 1940/04/19   83 y.o. Male  MRN: [id number] Visit Date: 09/19/2023  Today's healthcare provider: Jeralene Mom, MD   Chief Complaint  Patient presents with   Hearing Problem    Patient would like a referral   Subjective    Discussed the use of AI scribe software for clinical note transcription with the patient, who gave verbal consent to proceed.  History of Present Illness   Jerry Mcpherson is an 83 year old male who presents with hearing difficulties and ear wax impaction.  He has been experiencing difficulty hearing despite using a hearing aid for the past six months. His hearing aid is over 45 years old and cannot be repaired. Significant ear wax impaction has been noted, requiring removal by a specialist due to his use of two blood thinners, which poses a risk during the procedure. The hearing difficulties have impacted his ability to work effectively.  He also has a wound on his foot causing significant pain, especially at night. He has been prescribed oxycodone  for pain management but prefers to use it sparingly due to concerns about driving and side effects. A pulsating boot device was recommended to improve blood circulation in his foot, but insurance has denied coverage for this device. He is frustrated with the insurance denial regarding the boot device.       Medications: Outpatient Medications Prior to Visit  Medication Sig   diclofenac  Sodium (VOLTAREN ) 1 % GEL Apply 4 g topically 4 (four) times daily as needed.   docusate sodium (COLACE) 100 MG capsule Take 1 capsule by mouth daily as needed.   ELIQUIS  5 MG TABS tablet TAKE 1 TABLET BY MOUTH TWICE A DAY   hydroxyurea (HYDREA) 500 MG capsule Take 500 mg by mouth as directed. 2 tablets 3 days a week, and 1 tablet all other days   levothyroxine  (SYNTHROID ) 75 MCG tablet TAKE 1 TABLET BY MOUTH EVERY DAY   metoprolol  succinate (TOPROL -XL) 50 MG 24  hr tablet TAKE 1 TABLET BY MOUTH EVERY DAY   Multiple Vitamins-Minerals (MULTIVITAMIN ADULT PO) Take 1 tablet by mouth daily.   oxyCODONE -acetaminophen  (PERCOCET) 10-325 MG tablet Take 1 tablet by mouth every 6 (six) hours as needed. for pain   sildenafil (VIAGRA) 50 MG tablet Take 0.5-1 tablets by mouth daily as needed.   valACYclovir  (VALTREX ) 1000 MG tablet TAKE 1 TABLET BY MOUTH TWICE A DAY AS NEEDED   amLODipine  (NORVASC ) 2.5 MG tablet TAKE ONE (1) TABLET BY MOUTH ONCE DAILY (Patient not taking: Reported on 08/10/2023)   Ascorbic Acid (VITAMIN C) 1000 MG tablet Take 2 tablets by mouth daily. (Patient not taking: Reported on 09/19/2023)   aspirin  81 MG tablet Take 2 tablets daily (Patient not taking: Reported on 08/10/2023)   budesonide -formoterol  (SYMBICORT ) 160-4.5 MCG/ACT inhaler Inhale 2 puffs into the lungs 2 (two) times daily. (Patient not taking: Reported on 08/10/2023)   clopidogrel (PLAVIX) 75 MG tablet Take 75 mg by mouth daily.   cyproheptadine  (PERIACTIN ) 4 MG tablet TAKE 1/2-1 TABLET BY MOUTH THREE TIMES DAILY AS NEEDED TO HELP APPETITE   doxycycline  (VIBRAMYCIN ) 100 MG capsule TAKE 1 CAPSULE BY MOUTH TWICE A DAY X 14 DAYS (Patient not taking: Reported on 08/10/2023)   fluticasone  (FLONASE ) 50 MCG/ACT nasal spray Place 2 sprays into both nostrils daily. (Patient not taking: Reported on 08/10/2023)   furosemide (LASIX) 20 MG tablet Take 1 tablet by mouth daily.  hydrochlorothiazide  (HYDRODIURIL ) 25 MG tablet TAKE 1 TABLET BY MOUTH EVERY DAY   mupirocin  cream (BACTROBAN ) 2 % Apply 1 Application topically 2 (two) times daily.   naproxen  (NAPROSYN ) 500 MG tablet TAKE 1 TABLET(500 MG) BY MOUTH TWICE DAILY WITH A MEAL   potassium chloride  (KLOR-CON ) 10 MEQ tablet Take by mouth.   promethazine  (PHENERGAN ) 12.5 MG tablet TAKE 1 TO 2 TABLETS(12.5 TO 25 MG) BY MOUTH EVERY 8 HOURS AS NEEDED FOR NAUSEA OR VOMITING (Patient not taking: Reported on 09/19/2023)   silver  sulfADIAZINE  (SILVADENE ) 1 %  cream Apply 1 Application topically 2 (two) times daily.   triamcinolone  (KENALOG ) 0.025 % cream Apply 1 Application topically 2 (two) times daily. (Patient not taking: Reported on 09/19/2023)   No facility-administered medications prior to visit.   Review of Systems     Objective    BP (!) 120/59 (BP Location: Left Arm, Patient Position: Sitting, Cuff Size: Normal)   Pulse 79   Wt 146 lb (66.2 kg)   SpO2 97%   BMI 20.95 kg/m   Physical Exam   Bilateral cerumen impaction  Assessment & Plan     1. Bilateral hearing loss due to cerumen impaction (Primary) He does not want to go to [facility] in [place]  - Ambulatory referral to ENT  Dr. Juengel in [place].   Jeralene Mom, MD  [facility] [phone] (phone) [phone] (fax)  [facility]

## 2023-09-21 DIAGNOSIS — D751 Secondary polycythemia: Secondary | ICD-10-CM | POA: Diagnosis not present

## 2023-09-21 DIAGNOSIS — I129 Hypertensive chronic kidney disease with stage 1 through stage 4 chronic kidney disease, or unspecified chronic kidney disease: Secondary | ICD-10-CM | POA: Diagnosis not present

## 2023-09-21 DIAGNOSIS — I872 Venous insufficiency (chronic) (peripheral): Secondary | ICD-10-CM | POA: Diagnosis not present

## 2023-09-21 DIAGNOSIS — D45 Polycythemia vera: Secondary | ICD-10-CM | POA: Diagnosis not present

## 2023-09-21 DIAGNOSIS — L03116 Cellulitis of left lower limb: Secondary | ICD-10-CM | POA: Diagnosis not present

## 2023-09-21 DIAGNOSIS — I82411 Acute embolism and thrombosis of right femoral vein: Secondary | ICD-10-CM | POA: Diagnosis not present

## 2023-09-21 DIAGNOSIS — I739 Peripheral vascular disease, unspecified: Secondary | ICD-10-CM | POA: Diagnosis not present

## 2023-09-21 DIAGNOSIS — N1832 Chronic kidney disease, stage 3b: Secondary | ICD-10-CM | POA: Diagnosis not present

## 2023-09-21 DIAGNOSIS — I4892 Unspecified atrial flutter: Secondary | ICD-10-CM | POA: Diagnosis not present

## 2023-09-27 DIAGNOSIS — D751 Secondary polycythemia: Secondary | ICD-10-CM | POA: Diagnosis not present

## 2023-09-27 DIAGNOSIS — I4892 Unspecified atrial flutter: Secondary | ICD-10-CM | POA: Diagnosis not present

## 2023-09-27 DIAGNOSIS — I129 Hypertensive chronic kidney disease with stage 1 through stage 4 chronic kidney disease, or unspecified chronic kidney disease: Secondary | ICD-10-CM | POA: Diagnosis not present

## 2023-09-27 DIAGNOSIS — L03116 Cellulitis of left lower limb: Secondary | ICD-10-CM | POA: Diagnosis not present

## 2023-09-27 DIAGNOSIS — I872 Venous insufficiency (chronic) (peripheral): Secondary | ICD-10-CM | POA: Diagnosis not present

## 2023-09-27 DIAGNOSIS — I82411 Acute embolism and thrombosis of right femoral vein: Secondary | ICD-10-CM | POA: Diagnosis not present

## 2023-09-27 DIAGNOSIS — N1832 Chronic kidney disease, stage 3b: Secondary | ICD-10-CM | POA: Diagnosis not present

## 2023-09-27 DIAGNOSIS — D45 Polycythemia vera: Secondary | ICD-10-CM | POA: Diagnosis not present

## 2023-09-27 DIAGNOSIS — I739 Peripheral vascular disease, unspecified: Secondary | ICD-10-CM | POA: Diagnosis not present

## 2023-09-29 DIAGNOSIS — I739 Peripheral vascular disease, unspecified: Secondary | ICD-10-CM

## 2023-09-29 DIAGNOSIS — E039 Hypothyroidism, unspecified: Secondary | ICD-10-CM

## 2023-09-29 DIAGNOSIS — I82411 Acute embolism and thrombosis of right femoral vein: Secondary | ICD-10-CM

## 2023-09-29 DIAGNOSIS — I129 Hypertensive chronic kidney disease with stage 1 through stage 4 chronic kidney disease, or unspecified chronic kidney disease: Secondary | ICD-10-CM | POA: Diagnosis not present

## 2023-09-29 DIAGNOSIS — E785 Hyperlipidemia, unspecified: Secondary | ICD-10-CM

## 2023-09-29 DIAGNOSIS — D751 Secondary polycythemia: Secondary | ICD-10-CM | POA: Diagnosis not present

## 2023-09-29 DIAGNOSIS — Z792 Long term (current) use of antibiotics: Secondary | ICD-10-CM

## 2023-09-29 DIAGNOSIS — Z7901 Long term (current) use of anticoagulants: Secondary | ICD-10-CM

## 2023-09-29 DIAGNOSIS — D72829 Elevated white blood cell count, unspecified: Secondary | ICD-10-CM

## 2023-09-29 DIAGNOSIS — I872 Venous insufficiency (chronic) (peripheral): Secondary | ICD-10-CM

## 2023-09-29 DIAGNOSIS — Z8551 Personal history of malignant neoplasm of bladder: Secondary | ICD-10-CM

## 2023-09-29 DIAGNOSIS — I4892 Unspecified atrial flutter: Secondary | ICD-10-CM

## 2023-09-29 DIAGNOSIS — D45 Polycythemia vera: Secondary | ICD-10-CM | POA: Diagnosis not present

## 2023-09-29 DIAGNOSIS — L03116 Cellulitis of left lower limb: Secondary | ICD-10-CM | POA: Diagnosis not present

## 2023-09-29 DIAGNOSIS — H9193 Unspecified hearing loss, bilateral: Secondary | ICD-10-CM

## 2023-09-29 DIAGNOSIS — Z85828 Personal history of other malignant neoplasm of skin: Secondary | ICD-10-CM

## 2023-09-29 DIAGNOSIS — Z9181 History of falling: Secondary | ICD-10-CM

## 2023-09-29 DIAGNOSIS — N1832 Chronic kidney disease, stage 3b: Secondary | ICD-10-CM | POA: Diagnosis not present

## 2023-09-30 ENCOUNTER — Other Ambulatory Visit: Payer: Self-pay | Admitting: Family Medicine

## 2023-09-30 DIAGNOSIS — A6000 Herpesviral infection of urogenital system, unspecified: Secondary | ICD-10-CM

## 2023-09-30 NOTE — Telephone Encounter (Unsigned)
 Copied from CRM [id number]. Topic: Clinical - Medication Refill >> Sep 30, 2023 12:32 PM Lizabeth Riggs wrote: Medication: valACYclovir  (VALTREX ) 1000 MG tablet  Has the patient contacted their pharmacy? Yes (Agent: If no, request that the patient contact the pharmacy for the refill. If patient does not wish to contact the pharmacy document the reason why and proceed with request.) (Agent: If yes, when and what did the pharmacy advise?) Pharmacy needs order to refill  This is the patient's preferred pharmacy:   [phone] [place], [place] - [address] [place] [address] Phone: [phone] Fax: [phone]  Is this the correct pharmacy for this prescription? Yes If no, delete pharmacy and type the correct one.   Has the prescription been filled recently? No  Is the patient out of the medication? Yes He has been out of medication for 3 days  Has the patient been seen for an appointment in the last year OR does the patient have an upcoming appointment? Yes  Can we respond through MyChart? No  Agent: Please be advised that Rx refills may take up to 3 business days. We ask that you follow-up with your pharmacy.

## 2023-10-04 DIAGNOSIS — I739 Peripheral vascular disease, unspecified: Secondary | ICD-10-CM | POA: Diagnosis not present

## 2023-10-04 DIAGNOSIS — L03116 Cellulitis of left lower limb: Secondary | ICD-10-CM | POA: Diagnosis not present

## 2023-10-04 DIAGNOSIS — I4892 Unspecified atrial flutter: Secondary | ICD-10-CM | POA: Diagnosis not present

## 2023-10-04 DIAGNOSIS — D45 Polycythemia vera: Secondary | ICD-10-CM | POA: Diagnosis not present

## 2023-10-04 DIAGNOSIS — I82411 Acute embolism and thrombosis of right femoral vein: Secondary | ICD-10-CM | POA: Diagnosis not present

## 2023-10-04 DIAGNOSIS — D751 Secondary polycythemia: Secondary | ICD-10-CM | POA: Diagnosis not present

## 2023-10-04 DIAGNOSIS — N1832 Chronic kidney disease, stage 3b: Secondary | ICD-10-CM | POA: Diagnosis not present

## 2023-10-04 DIAGNOSIS — I872 Venous insufficiency (chronic) (peripheral): Secondary | ICD-10-CM | POA: Diagnosis not present

## 2023-10-04 DIAGNOSIS — I129 Hypertensive chronic kidney disease with stage 1 through stage 4 chronic kidney disease, or unspecified chronic kidney disease: Secondary | ICD-10-CM | POA: Diagnosis not present

## 2023-10-06 ENCOUNTER — Telehealth: Payer: Self-pay

## 2023-10-06 DIAGNOSIS — I4892 Unspecified atrial flutter: Secondary | ICD-10-CM | POA: Diagnosis not present

## 2023-10-06 DIAGNOSIS — D751 Secondary polycythemia: Secondary | ICD-10-CM | POA: Diagnosis not present

## 2023-10-06 DIAGNOSIS — N1832 Chronic kidney disease, stage 3b: Secondary | ICD-10-CM | POA: Diagnosis not present

## 2023-10-06 DIAGNOSIS — L03116 Cellulitis of left lower limb: Secondary | ICD-10-CM | POA: Diagnosis not present

## 2023-10-06 DIAGNOSIS — I739 Peripheral vascular disease, unspecified: Secondary | ICD-10-CM | POA: Diagnosis not present

## 2023-10-06 DIAGNOSIS — I82411 Acute embolism and thrombosis of right femoral vein: Secondary | ICD-10-CM | POA: Diagnosis not present

## 2023-10-06 DIAGNOSIS — I872 Venous insufficiency (chronic) (peripheral): Secondary | ICD-10-CM | POA: Diagnosis not present

## 2023-10-06 DIAGNOSIS — D45 Polycythemia vera: Secondary | ICD-10-CM | POA: Diagnosis not present

## 2023-10-06 DIAGNOSIS — I129 Hypertensive chronic kidney disease with stage 1 through stage 4 chronic kidney disease, or unspecified chronic kidney disease: Secondary | ICD-10-CM | POA: Diagnosis not present

## 2023-10-06 NOTE — Telephone Encounter (Signed)
 Copied from CRM [id number]. Topic: General - Other >> Oct 06, 2023  9:51 AM Travis F wrote: Reason for CRM: Ana with [facility] is calling in to report that patient has a wound with green drainage between his third and fourth toe on his right foot and she wanted to report it to his provider.

## 2023-10-07 ENCOUNTER — Encounter: Admitting: Physician Assistant

## 2023-10-07 DIAGNOSIS — L97522 Non-pressure chronic ulcer of other part of left foot with fat layer exposed: Secondary | ICD-10-CM | POA: Diagnosis not present

## 2023-10-10 ENCOUNTER — Other Ambulatory Visit: Payer: Self-pay | Admitting: Family Medicine

## 2023-10-10 DIAGNOSIS — M5410 Radiculopathy, site unspecified: Secondary | ICD-10-CM

## 2023-10-10 DIAGNOSIS — G589 Mononeuropathy, unspecified: Secondary | ICD-10-CM

## 2023-10-10 NOTE — Telephone Encounter (Unsigned)
 Copied from CRM [id number]. Topic: Clinical - Medication Refill >> Oct 10, 2023 10:02 AM Delon DASEN wrote: Medication: oxyCODONE -acetaminophen  (PERCOCET) 10-325 MG tablet  Has the patient contacted their pharmacy? No (Agent: If no, request that the patient contact the pharmacy for the refill. If patient does not wish to contact the pharmacy document the reason why and proceed with request.) (Agent: If yes, when and what did the pharmacy advise?)  This is the patient's preferred pharmacy:  [facility] - [place], [place] - [address] [place] [address] Phone: [phone] Fax: [phone]   Is this the correct pharmacy for this prescription? No If no, delete pharmacy and type the correct one.   Has the prescription been filled recently? Yes  Is the patient out of the medication? Yes  Has the patient been seen for an appointment in the last year OR does the patient have an upcoming appointment? Yes  Can we respond through MyChart? Yes  Agent: Please be advised that Rx refills may take up to 3 business days. We ask that you follow-up with your pharmacy.

## 2023-10-10 NOTE — Telephone Encounter (Signed)
 LOV 09/19/23 NOV 10/12/23 LRF 09/08/23 120 x 0

## 2023-10-11 NOTE — Telephone Encounter (Signed)
 Requested medications are due for refill today.  no  Requested medications are on the active medications list.  yes  Last refill. 10/11/2023#120 0 rf  Future visit scheduled.   yes  Notes to clinic.  Refusal not delegated.    Requested Prescriptions  Pending Prescriptions Disp Refills   oxyCODONE -acetaminophen  (PERCOCET) 10-325 MG tablet 120 tablet 0    Sig: Take 1 tablet by mouth every 6 (six) hours as needed. for pain     Not Delegated - Analgesics:  Opioid Agonist Combinations Failed - 10/11/2023  5:59 PM      Failed - This refill cannot be delegated      Failed - Urine Drug Screen completed in last 360 days      Passed - Valid encounter within last 3 months    Recent Outpatient Visits           3 weeks ago Bilateral hearing loss due to cerumen impaction   [facility] [name], MD   3 months ago Hand dermatitis   [facility] [name], MD   4 months ago Cellulitis of toe of left foot   [facility] [name], MD

## 2023-10-12 ENCOUNTER — Ambulatory Visit: Admitting: Family Medicine

## 2023-10-13 DIAGNOSIS — D45 Polycythemia vera: Secondary | ICD-10-CM | POA: Diagnosis not present

## 2023-10-13 DIAGNOSIS — I129 Hypertensive chronic kidney disease with stage 1 through stage 4 chronic kidney disease, or unspecified chronic kidney disease: Secondary | ICD-10-CM | POA: Diagnosis not present

## 2023-10-13 DIAGNOSIS — D751 Secondary polycythemia: Secondary | ICD-10-CM | POA: Diagnosis not present

## 2023-10-13 DIAGNOSIS — I872 Venous insufficiency (chronic) (peripheral): Secondary | ICD-10-CM | POA: Diagnosis not present

## 2023-10-13 DIAGNOSIS — I4892 Unspecified atrial flutter: Secondary | ICD-10-CM | POA: Diagnosis not present

## 2023-10-13 DIAGNOSIS — I82411 Acute embolism and thrombosis of right femoral vein: Secondary | ICD-10-CM | POA: Diagnosis not present

## 2023-10-13 DIAGNOSIS — N1832 Chronic kidney disease, stage 3b: Secondary | ICD-10-CM | POA: Diagnosis not present

## 2023-10-13 DIAGNOSIS — L03116 Cellulitis of left lower limb: Secondary | ICD-10-CM | POA: Diagnosis not present

## 2023-10-13 DIAGNOSIS — I739 Peripheral vascular disease, unspecified: Secondary | ICD-10-CM | POA: Diagnosis not present

## 2023-10-18 ENCOUNTER — Ambulatory Visit: Payer: Self-pay

## 2023-10-18 DIAGNOSIS — S31809A Unspecified open wound of unspecified buttock, initial encounter: Secondary | ICD-10-CM

## 2023-10-18 NOTE — Telephone Encounter (Signed)
 FYI Only or Action Required?: Action required by provider: update on patient condition.  Patient was last seen in primary care on 09/19/2023 by Jerry Nancyann BRAVO, MD.  Called Nurse Triage reporting Hearing Problem and Sore.  Symptoms began several months ago.  Interventions attempted: Prescription medications: ear drops by ENT that is not improving sx.  Symptoms are: gradually worsening.  Triage Disposition: See HCP Within 4 Hours (Or PCP Triage), See Physician Within 24 Hours  Patient/caregiver understands and will follow disposition?: Unsure    Copied from CRM [id number]. Topic: Clinical - Red Word Triage >> Oct 18, 2023 10:45 AM Nathanel BROCKS wrote: Red Word that prompted transfer to Nurse Triage:   Pt states that he has sore on his butt. He has had it before but its come back and hurts. He also is having trouble earing. He has been using the drops but they are not helping. Can't hear at all out of that ear.  ----------------------------------------------------------------------- From previous Reason for Contact - Pink Word Triage: Reason for Triage: Reason for Disposition  [1] Small red streak or spreading redness (< 2 inches or 5 cm) AND [2] no fever  [1] Hearing loss in one or both ears AND [2] sudden onset AND [3] present now  Answer Assessment - Initial Assessment Questions 1. DESCRIPTION: What type of hearing problem are you having? Describe it for me. (e.g., complete hearing loss, partial loss)     Partial hearing loss 2. LOCATION: One or both ears? If one, ask: Which ear?     Bilateral, R>L 3. SEVERITY: Can you hear anything? If Yes, ask: What can you hear? (e.g., ticking watch, whisper, talking)   - MILD:  Difficulty hearing soft speech, quiet library sounds, or speech from a distance or over background noise.   - MODERATE: Difficulty hearing normal speech even at closed distances.   - SEVERE: Unable to hear most normal conversation and talking; only able to hear loud  sounds such as an alarm clock.     Moderate-severe Reports PCP referred to ENT - rx for ear drops 4. ONSET: When did this begin? Did it start suddenly or come on gradually?     Reports had chemo x 3 years ago, that gradually got worse 5. PATTERN: Does this come and go, or has it been constant since it started?     constant 6. PAIN: Is there any pain in your ear(s)?  (Scale 1-10; or mild, moderate, severe)   - NONE (0): no pain   - MILD (1-3): doesn't interfere with normal activities    - MODERATE (4-7): interferes with normal activities or awakens from sleep    - SEVERE (8-10): excruciating pain, unable to do any normal activities      denies 7. CAUSE: What do you think is causing this hearing problem?     Unknown, ENT reports wax buildup 8. OTHER SYMPTOMS: Do you have any other symptoms? (e.g., dizziness, ringing in ears)     denies 9. PREGNANCY: Is there any chance you are pregnant? When was your last menstrual period?     N/a    Triage was extremely limited d/t pt hearing loss. Pt did endorse having ENT appt tomorrow, but has concerns and wants to be seen sooner. Triager will forward encounter for Dr Jerry 's office to review and advise as he has no appointments at this time.  Answer Assessment - Initial Assessment Questions 1. DESCRIPTION: What does the injury look like?  Is there any part of  the skin missing?  If yes, How much of the skin flap is missing?  (25%, 50%, 75%, all)     Sore in the middle of back side 2. SIZE: How large is the skin tear?     unknown 3. BLEEDING: Is it bleeding now? If Yes, ask: Is it difficult to stop?     denies 4. LOCATION: Where is the skin tear located?     midline 5. ONSET: How long ago did the skin tear occur?     unknown 6. MECHANISM: Tell me how it happened.     Sore. Reports hx a few years ago, now has come back  Protocols used: Hearing Loss or Change-A-AH, Sores-A-AH, Skin Tear-A-AH

## 2023-10-19 ENCOUNTER — Other Ambulatory Visit: Payer: Self-pay | Admitting: Family Medicine

## 2023-10-19 DIAGNOSIS — S31809A Unspecified open wound of unspecified buttock, initial encounter: Secondary | ICD-10-CM

## 2023-10-19 MED ORDER — MUPIROCIN CALCIUM 2 % EX CREA
1.0000 | TOPICAL_CREAM | Freq: Two times a day (BID) | CUTANEOUS | 5 refills | Status: DC
Start: 2023-10-19 — End: 2023-12-17

## 2023-10-19 NOTE — Telephone Encounter (Signed)
 Patient advised.

## 2023-10-19 NOTE — Telephone Encounter (Signed)
 [facility]. Need o.v. if not improving in a week.  Was previously referred to ENT for cerumen impaction and hearing loss.

## 2023-10-20 DIAGNOSIS — I739 Peripheral vascular disease, unspecified: Secondary | ICD-10-CM | POA: Diagnosis not present

## 2023-10-20 DIAGNOSIS — N1832 Chronic kidney disease, stage 3b: Secondary | ICD-10-CM | POA: Diagnosis not present

## 2023-10-20 DIAGNOSIS — D751 Secondary polycythemia: Secondary | ICD-10-CM | POA: Diagnosis not present

## 2023-10-20 DIAGNOSIS — D45 Polycythemia vera: Secondary | ICD-10-CM | POA: Diagnosis not present

## 2023-10-20 DIAGNOSIS — L03116 Cellulitis of left lower limb: Secondary | ICD-10-CM | POA: Diagnosis not present

## 2023-10-20 DIAGNOSIS — I4892 Unspecified atrial flutter: Secondary | ICD-10-CM | POA: Diagnosis not present

## 2023-10-20 DIAGNOSIS — I129 Hypertensive chronic kidney disease with stage 1 through stage 4 chronic kidney disease, or unspecified chronic kidney disease: Secondary | ICD-10-CM | POA: Diagnosis not present

## 2023-10-20 DIAGNOSIS — I82411 Acute embolism and thrombosis of right femoral vein: Secondary | ICD-10-CM | POA: Diagnosis not present

## 2023-10-20 DIAGNOSIS — I872 Venous insufficiency (chronic) (peripheral): Secondary | ICD-10-CM | POA: Diagnosis not present

## 2023-10-21 ENCOUNTER — Encounter: Attending: Physician Assistant | Admitting: Physician Assistant

## 2023-10-21 DIAGNOSIS — I129 Hypertensive chronic kidney disease with stage 1 through stage 4 chronic kidney disease, or unspecified chronic kidney disease: Secondary | ICD-10-CM | POA: Diagnosis not present

## 2023-10-21 DIAGNOSIS — L97522 Non-pressure chronic ulcer of other part of left foot with fat layer exposed: Secondary | ICD-10-CM | POA: Diagnosis present

## 2023-10-21 DIAGNOSIS — I7025 Atherosclerosis of native arteries of other extremities with ulceration: Secondary | ICD-10-CM | POA: Insufficient documentation

## 2023-10-21 DIAGNOSIS — I7389 Other specified peripheral vascular diseases: Secondary | ICD-10-CM | POA: Diagnosis not present

## 2023-10-21 DIAGNOSIS — N189 Chronic kidney disease, unspecified: Secondary | ICD-10-CM | POA: Insufficient documentation

## 2023-10-26 DIAGNOSIS — I82411 Acute embolism and thrombosis of right femoral vein: Secondary | ICD-10-CM | POA: Diagnosis not present

## 2023-10-26 DIAGNOSIS — I739 Peripheral vascular disease, unspecified: Secondary | ICD-10-CM | POA: Diagnosis not present

## 2023-10-26 DIAGNOSIS — I4892 Unspecified atrial flutter: Secondary | ICD-10-CM | POA: Diagnosis not present

## 2023-10-26 DIAGNOSIS — D45 Polycythemia vera: Secondary | ICD-10-CM | POA: Diagnosis not present

## 2023-10-26 DIAGNOSIS — I872 Venous insufficiency (chronic) (peripheral): Secondary | ICD-10-CM | POA: Diagnosis not present

## 2023-10-26 DIAGNOSIS — N1832 Chronic kidney disease, stage 3b: Secondary | ICD-10-CM | POA: Diagnosis not present

## 2023-10-26 DIAGNOSIS — D751 Secondary polycythemia: Secondary | ICD-10-CM | POA: Diagnosis not present

## 2023-10-26 DIAGNOSIS — I129 Hypertensive chronic kidney disease with stage 1 through stage 4 chronic kidney disease, or unspecified chronic kidney disease: Secondary | ICD-10-CM | POA: Diagnosis not present

## 2023-10-26 DIAGNOSIS — L03116 Cellulitis of left lower limb: Secondary | ICD-10-CM | POA: Diagnosis not present

## 2023-10-31 ENCOUNTER — Ambulatory Visit: Payer: Self-pay

## 2023-10-31 ENCOUNTER — Other Ambulatory Visit: Payer: Self-pay | Admitting: Family Medicine

## 2023-10-31 DIAGNOSIS — L309 Dermatitis, unspecified: Secondary | ICD-10-CM

## 2023-10-31 MED ORDER — SILVER SULFADIAZINE 1 % EX CREA: 1.0000 | TOPICAL_CREAM | Freq: Two times a day (BID) | CUTANEOUS | 0 refills | Status: DC

## 2023-10-31 NOTE — Telephone Encounter (Signed)
 FYI Only or Action Required?: Action required by provider: clinical question for provider.  Patient was last seen in primary care on 09/19/2023 by [name], MD.  Called Nurse Triage reporting No chief complaint on file..  Symptoms began several days ago.  Interventions attempted: Prescription medications: Mupirocin  ointment.  Symptoms are: unchanged.  Triage Disposition: Call PCP Within 24 Hours  Patient/caregiver understands and will follow disposition?: Unsure     Copied from CRM [id number]. Topic: Clinical - Medical Advice >> Oct 31, 2023 12:28 PM Yolanda T wrote: Reason for CRM: patient called said the mupirocin  cream (BACTROBAN ) 2 % that he has been using is not working. Patient wants to know if provider needs to see him or if he can send in another script for something else. Reason for Disposition  [1] Follow-up call from patient regarding patient's clinical status AND [2] information NON-URGENT  Answer Assessment - Initial Assessment Questions 1. REASON FOR CALL or QUESTION: What is your reason for calling today? or How can I best     Patient states wound is not getting any better. Denies fever, denies drainage. Patient is looking for further advice since the Mupirocin  is not working.    2. CALLER: Document the source of call. (e.g., laboratory staff, caregiver or patient).     Patient  Protocols used: PCP Call - No Triage-A-AH

## 2023-10-31 NOTE — Telephone Encounter (Addendum)
 Change to silvadene . [facility]

## 2023-11-01 DIAGNOSIS — I129 Hypertensive chronic kidney disease with stage 1 through stage 4 chronic kidney disease, or unspecified chronic kidney disease: Secondary | ICD-10-CM | POA: Diagnosis not present

## 2023-11-01 DIAGNOSIS — I82411 Acute embolism and thrombosis of right femoral vein: Secondary | ICD-10-CM | POA: Diagnosis not present

## 2023-11-01 DIAGNOSIS — I872 Venous insufficiency (chronic) (peripheral): Secondary | ICD-10-CM | POA: Diagnosis not present

## 2023-11-01 DIAGNOSIS — I739 Peripheral vascular disease, unspecified: Secondary | ICD-10-CM | POA: Diagnosis not present

## 2023-11-01 DIAGNOSIS — N1832 Chronic kidney disease, stage 3b: Secondary | ICD-10-CM | POA: Diagnosis not present

## 2023-11-01 DIAGNOSIS — L03116 Cellulitis of left lower limb: Secondary | ICD-10-CM | POA: Diagnosis not present

## 2023-11-01 DIAGNOSIS — D45 Polycythemia vera: Secondary | ICD-10-CM | POA: Diagnosis not present

## 2023-11-01 DIAGNOSIS — D751 Secondary polycythemia: Secondary | ICD-10-CM | POA: Diagnosis not present

## 2023-11-01 DIAGNOSIS — I4892 Unspecified atrial flutter: Secondary | ICD-10-CM | POA: Diagnosis not present

## 2023-11-01 NOTE — Telephone Encounter (Signed)
 Patient advised.

## 2023-11-04 ENCOUNTER — Encounter: Admitting: Physician Assistant

## 2023-11-08 ENCOUNTER — Other Ambulatory Visit: Payer: Self-pay | Admitting: Family Medicine

## 2023-11-08 DIAGNOSIS — G589 Mononeuropathy, unspecified: Secondary | ICD-10-CM

## 2023-11-08 DIAGNOSIS — D751 Secondary polycythemia: Secondary | ICD-10-CM | POA: Diagnosis not present

## 2023-11-08 DIAGNOSIS — D45 Polycythemia vera: Secondary | ICD-10-CM | POA: Diagnosis not present

## 2023-11-08 DIAGNOSIS — M5410 Radiculopathy, site unspecified: Secondary | ICD-10-CM

## 2023-11-08 DIAGNOSIS — N1832 Chronic kidney disease, stage 3b: Secondary | ICD-10-CM | POA: Diagnosis not present

## 2023-11-08 DIAGNOSIS — I739 Peripheral vascular disease, unspecified: Secondary | ICD-10-CM | POA: Diagnosis not present

## 2023-11-08 DIAGNOSIS — I4892 Unspecified atrial flutter: Secondary | ICD-10-CM | POA: Diagnosis not present

## 2023-11-08 DIAGNOSIS — I872 Venous insufficiency (chronic) (peripheral): Secondary | ICD-10-CM | POA: Diagnosis not present

## 2023-11-08 DIAGNOSIS — I129 Hypertensive chronic kidney disease with stage 1 through stage 4 chronic kidney disease, or unspecified chronic kidney disease: Secondary | ICD-10-CM | POA: Diagnosis not present

## 2023-11-08 DIAGNOSIS — I82411 Acute embolism and thrombosis of right femoral vein: Secondary | ICD-10-CM | POA: Diagnosis not present

## 2023-11-08 DIAGNOSIS — L03116 Cellulitis of left lower limb: Secondary | ICD-10-CM | POA: Diagnosis not present

## 2023-11-08 NOTE — Telephone Encounter (Unsigned)
 Copied from CRM [id number]. Topic: Clinical - Medication Refill >> Nov 08, 2023 11:02 AM Emylou G wrote: Medication: oxyCODONE -acetaminophen  (PERCOCET) 10-325 MG tablet  Has the patient contacted their pharmacy? No (Agent: If no, request that the patient contact the pharmacy for the refill. If patient does not wish to contact the pharmacy document the reason why and proceed with request.) (Agent: If yes, when and what did the pharmacy advise?)  This is the patient's preferred pharmacy:  [facility] - [place],  - [address] [place] [address] Phone: [phone] Fax: [phone]   Is this the correct pharmacy for this prescription? Yes If no, delete pharmacy and type the correct one.   Has the prescription been filled recently? Yes   Is the patient out of the medication? Yes  Has the patient been seen for an appointment in the last year OR does the patient have an upcoming appointment? Yes  Can we respond through MyChart? No  Agent: Please be advised that Rx refills may take up to 3 business days. We ask that you follow-up with your pharmacy.

## 2023-11-09 ENCOUNTER — Other Ambulatory Visit: Payer: Self-pay | Admitting: Family Medicine

## 2023-11-09 NOTE — Telephone Encounter (Unsigned)
 Copied from CRM [id number]. Topic: Clinical - Medication Refill >> Nov 09, 2023  3:52 PM Geneva B wrote: Medication: oxyCODONE -acetaminophen  (PERCOCET) 10-325 MG tablet  Has the patient contacted their pharmacy? Yes (Agent: If no, request that the patient contact the pharmacy for the refill. If patient does not wish to contact the pharmacy document the reason why and proceed with request.) (Agent: If yes, when and what did the pharmacy advise?)  This is the patient's preferred pharmacy:  [facility] - [place], [place] - [address] [place] [address] Phone: [phone] Fax: [phone]   Is this the correct pharmacy for this prescription? Yes If no, delete pharmacy and type the correct one.   Has the prescription been filled recently? Yes  Is the patient out of the medication? Yes  Has the patient been seen for an appointment in the last year OR does the patient have an upcoming appointment? Yes  Can we respond through MyChart? No  Agent: Please be advised that Rx refills may take up to 3 business days. We ask that you follow-up with your pharmacy.

## 2023-11-09 NOTE — Telephone Encounter (Signed)
 Requested medication (s) are due for refill today: yes  Requested medication (s) are on the active medication list: yes  Last refill:  10/11/23  Future visit scheduled: No  Notes to clinic:  Unable to refill per protocol, cannot delegate.      Requested Prescriptions  Pending Prescriptions Disp Refills   oxyCODONE -acetaminophen  (PERCOCET) 10-325 MG tablet 120 tablet 0    Sig: Take 1 tablet by mouth every 6 (six) hours as needed. for pain     Not Delegated - Analgesics:  Opioid Agonist Combinations Failed - 11/09/2023 12:25 PM      Failed - This refill cannot be delegated      Failed - Urine Drug Screen completed in last 360 days      Passed - Valid encounter within last 3 months    Recent Outpatient Visits           1 month ago Bilateral hearing loss due to cerumen impaction   [facility] [name], MD   3 months ago Hand dermatitis   [place] [facility] [name], MD   5 months ago Cellulitis of toe of left foot   [facility] [name], MD

## 2023-11-10 ENCOUNTER — Other Ambulatory Visit: Payer: Self-pay

## 2023-11-10 DIAGNOSIS — M5410 Radiculopathy, site unspecified: Secondary | ICD-10-CM

## 2023-11-10 DIAGNOSIS — G589 Mononeuropathy, unspecified: Secondary | ICD-10-CM

## 2023-11-10 MED ORDER — OXYCODONE-ACETAMINOPHEN 10-325 MG PO TABS: 1.0000 | ORAL_TABLET | Freq: Four times a day (QID) | ORAL | 0 refills | Status: DC | PRN

## 2023-11-10 NOTE — Telephone Encounter (Signed)
 Source  [place], Jerry Mcpherson (Patient)   Subject  Jerry Mcpherson, Jerry Mcpherson (Patient)   Topic  Clinical - Prescription Issue    Communication  Reason for CRM: oxyCODONE -acetaminophen  (PERCOCET) 10-325 MG tablet- refill not sent yet- going out of town and need it before he leaves

## 2023-11-16 DIAGNOSIS — N1832 Chronic kidney disease, stage 3b: Secondary | ICD-10-CM | POA: Diagnosis not present

## 2023-11-16 DIAGNOSIS — D45 Polycythemia vera: Secondary | ICD-10-CM | POA: Diagnosis not present

## 2023-11-16 DIAGNOSIS — I872 Venous insufficiency (chronic) (peripheral): Secondary | ICD-10-CM | POA: Diagnosis not present

## 2023-11-16 DIAGNOSIS — L03116 Cellulitis of left lower limb: Secondary | ICD-10-CM | POA: Diagnosis not present

## 2023-11-16 DIAGNOSIS — D751 Secondary polycythemia: Secondary | ICD-10-CM | POA: Diagnosis not present

## 2023-11-16 DIAGNOSIS — I129 Hypertensive chronic kidney disease with stage 1 through stage 4 chronic kidney disease, or unspecified chronic kidney disease: Secondary | ICD-10-CM | POA: Diagnosis not present

## 2023-11-16 DIAGNOSIS — I4892 Unspecified atrial flutter: Secondary | ICD-10-CM | POA: Diagnosis not present

## 2023-11-16 DIAGNOSIS — I82411 Acute embolism and thrombosis of right femoral vein: Secondary | ICD-10-CM | POA: Diagnosis not present

## 2023-11-16 DIAGNOSIS — I739 Peripheral vascular disease, unspecified: Secondary | ICD-10-CM | POA: Diagnosis not present

## 2023-11-18 ENCOUNTER — Encounter: Attending: Physician Assistant | Admitting: Physician Assistant

## 2023-11-18 DIAGNOSIS — I7389 Other specified peripheral vascular diseases: Secondary | ICD-10-CM | POA: Diagnosis not present

## 2023-11-18 DIAGNOSIS — N189 Chronic kidney disease, unspecified: Secondary | ICD-10-CM | POA: Diagnosis not present

## 2023-11-18 DIAGNOSIS — L97522 Non-pressure chronic ulcer of other part of left foot with fat layer exposed: Secondary | ICD-10-CM | POA: Diagnosis not present

## 2023-11-18 DIAGNOSIS — I129 Hypertensive chronic kidney disease with stage 1 through stage 4 chronic kidney disease, or unspecified chronic kidney disease: Secondary | ICD-10-CM | POA: Insufficient documentation

## 2023-11-18 DIAGNOSIS — I7025 Atherosclerosis of native arteries of other extremities with ulceration: Secondary | ICD-10-CM | POA: Diagnosis not present

## 2023-11-23 DIAGNOSIS — E785 Hyperlipidemia, unspecified: Secondary | ICD-10-CM | POA: Diagnosis not present

## 2023-11-23 DIAGNOSIS — I1310 Hypertensive heart and chronic kidney disease without heart failure, with stage 1 through stage 4 chronic kidney disease, or unspecified chronic kidney disease: Secondary | ICD-10-CM | POA: Diagnosis not present

## 2023-11-23 DIAGNOSIS — I4892 Unspecified atrial flutter: Secondary | ICD-10-CM | POA: Diagnosis not present

## 2023-11-23 DIAGNOSIS — D45 Polycythemia vera: Secondary | ICD-10-CM | POA: Diagnosis not present

## 2023-11-23 DIAGNOSIS — N1832 Chronic kidney disease, stage 3b: Secondary | ICD-10-CM | POA: Diagnosis not present

## 2023-11-23 DIAGNOSIS — I739 Peripheral vascular disease, unspecified: Secondary | ICD-10-CM | POA: Diagnosis not present

## 2023-11-23 DIAGNOSIS — E039 Hypothyroidism, unspecified: Secondary | ICD-10-CM | POA: Diagnosis not present

## 2023-11-23 DIAGNOSIS — L03116 Cellulitis of left lower limb: Secondary | ICD-10-CM | POA: Diagnosis not present

## 2023-11-23 DIAGNOSIS — I872 Venous insufficiency (chronic) (peripheral): Secondary | ICD-10-CM | POA: Diagnosis not present

## 2023-11-25 ENCOUNTER — Encounter: Admitting: Physician Assistant

## 2023-11-25 DIAGNOSIS — L03116 Cellulitis of left lower limb: Secondary | ICD-10-CM | POA: Diagnosis not present

## 2023-11-25 DIAGNOSIS — N1832 Chronic kidney disease, stage 3b: Secondary | ICD-10-CM | POA: Diagnosis not present

## 2023-11-25 DIAGNOSIS — L97522 Non-pressure chronic ulcer of other part of left foot with fat layer exposed: Secondary | ICD-10-CM | POA: Diagnosis not present

## 2023-11-25 DIAGNOSIS — I739 Peripheral vascular disease, unspecified: Secondary | ICD-10-CM | POA: Diagnosis not present

## 2023-11-25 DIAGNOSIS — Z86718 Personal history of other venous thrombosis and embolism: Secondary | ICD-10-CM

## 2023-11-25 DIAGNOSIS — E039 Hypothyroidism, unspecified: Secondary | ICD-10-CM | POA: Diagnosis not present

## 2023-11-25 DIAGNOSIS — I7025 Atherosclerosis of native arteries of other extremities with ulceration: Secondary | ICD-10-CM | POA: Diagnosis not present

## 2023-11-25 DIAGNOSIS — I872 Venous insufficiency (chronic) (peripheral): Secondary | ICD-10-CM | POA: Diagnosis not present

## 2023-11-25 DIAGNOSIS — Z85828 Personal history of other malignant neoplasm of skin: Secondary | ICD-10-CM

## 2023-11-25 DIAGNOSIS — I7389 Other specified peripheral vascular diseases: Secondary | ICD-10-CM | POA: Diagnosis not present

## 2023-11-25 DIAGNOSIS — I1310 Hypertensive heart and chronic kidney disease without heart failure, with stage 1 through stage 4 chronic kidney disease, or unspecified chronic kidney disease: Secondary | ICD-10-CM | POA: Diagnosis not present

## 2023-11-25 DIAGNOSIS — I4892 Unspecified atrial flutter: Secondary | ICD-10-CM | POA: Diagnosis not present

## 2023-11-25 DIAGNOSIS — I129 Hypertensive chronic kidney disease with stage 1 through stage 4 chronic kidney disease, or unspecified chronic kidney disease: Secondary | ICD-10-CM | POA: Diagnosis not present

## 2023-11-25 DIAGNOSIS — N189 Chronic kidney disease, unspecified: Secondary | ICD-10-CM | POA: Diagnosis not present

## 2023-11-25 DIAGNOSIS — D45 Polycythemia vera: Secondary | ICD-10-CM | POA: Diagnosis not present

## 2023-11-25 DIAGNOSIS — E785 Hyperlipidemia, unspecified: Secondary | ICD-10-CM | POA: Diagnosis not present

## 2023-11-25 DIAGNOSIS — H9193 Unspecified hearing loss, bilateral: Secondary | ICD-10-CM

## 2023-11-29 DIAGNOSIS — C4330 Malignant melanoma of unspecified part of face: Secondary | ICD-10-CM | POA: Diagnosis not present

## 2023-11-30 ENCOUNTER — Ambulatory Visit: Payer: Self-pay

## 2023-11-30 DIAGNOSIS — L03116 Cellulitis of left lower limb: Secondary | ICD-10-CM | POA: Diagnosis not present

## 2023-11-30 DIAGNOSIS — I872 Venous insufficiency (chronic) (peripheral): Secondary | ICD-10-CM | POA: Diagnosis not present

## 2023-11-30 DIAGNOSIS — D45 Polycythemia vera: Secondary | ICD-10-CM | POA: Diagnosis not present

## 2023-11-30 DIAGNOSIS — E785 Hyperlipidemia, unspecified: Secondary | ICD-10-CM | POA: Diagnosis not present

## 2023-11-30 DIAGNOSIS — E039 Hypothyroidism, unspecified: Secondary | ICD-10-CM | POA: Diagnosis not present

## 2023-11-30 DIAGNOSIS — I1310 Hypertensive heart and chronic kidney disease without heart failure, with stage 1 through stage 4 chronic kidney disease, or unspecified chronic kidney disease: Secondary | ICD-10-CM | POA: Diagnosis not present

## 2023-11-30 DIAGNOSIS — N1832 Chronic kidney disease, stage 3b: Secondary | ICD-10-CM | POA: Diagnosis not present

## 2023-11-30 DIAGNOSIS — I739 Peripheral vascular disease, unspecified: Secondary | ICD-10-CM | POA: Diagnosis not present

## 2023-11-30 DIAGNOSIS — I4892 Unspecified atrial flutter: Secondary | ICD-10-CM | POA: Diagnosis not present

## 2023-11-30 NOTE — Telephone Encounter (Signed)
 Please see the message below.

## 2023-11-30 NOTE — Telephone Encounter (Signed)
 FYI Only or Action Required?: FYI only for provider.  Patient was last seen in primary care on 09/19/2023 by [name], MD.  Called Nurse Triage reporting Shortness of Breath.  Symptoms began several months ago.  Interventions attempted: Nothing.  Symptoms are: unchanged.  Triage Disposition: See PCP When Office is Open (Within 3 Days)  Patient/caregiver understands and will follow disposition?: Yes     Copied from CRM #[id number]. Topic: Clinical - Red Word Triage >> Nov 30, 2023  2:56 PM Sophia H wrote: Red Word that prompted transfer to Nurse Triage: Home health Nurse Shasta is calling to schedule an appointment. States the patient informed her he has been experiencing shortness of breath when he walks about 14ft. No other symptoms, denies chest pains. NT Reason for Disposition  [1] MODERATE longstanding difficulty breathing (e.g., speaks in phrases, SOB even at rest, pulse 100-120) AND [2] SAME as normal  Answer Assessment - Initial Assessment Questions 1. RESPIRATORY STATUS: Describe your breathing? (e.g., wheezing, shortness of breath, unable to speak, severe coughing)      sob 2. ONSET: When did this breathing problem begin?      Couple of months 3. PATTERN Does the difficult breathing come and go, or has it been constant since it started?      constant 4. SEVERITY: How bad is your breathing? (e.g., mild, moderate, severe)      mild 5. RECURRENT SYMPTOM: Have you had difficulty breathing before? If Yes, ask: When was the last time? and What happened that time?      For two months 6. CARDIAC HISTORY: Do you have any history of heart disease? (e.g., heart attack, angina, bypass surgery, angioplasty)      Htn, dvt,  7. LUNG HISTORY: Do you have any history of lung disease?  (e.g., pulmonary embolus, asthma, emphysema)     no 8. CAUSE: What do you think is causing the breathing problem?      unknown 9. OTHER SYMPTOMS: Do you have any other symptoms?  (e.g., chest pain, cough, dizziness, fever, runny nose)     no 10. O2 SATURATION MONITOR:  Do you use an oxygen saturation monitor (pulse oximeter) at home? If Yes, ask: What is your reading (oxygen level) today? What is your usual oxygen saturation reading? (e.g., 95%)       na 11. PREGNANCY: Is there any chance you are pregnant? When was your last menstrual period?       na 12. TRAVEL: Have you traveled out of the country in the last month? (e.g., travel history, exposures)       no  Protocols used: Breathing Difficulty-A-AH

## 2023-11-30 NOTE — Telephone Encounter (Signed)
 HH RN Ana calling to inform PCP of pt having increased SOB with exertion, see triage notes from [id number].  Pt was triaged by another RN and phones were disconnected. Pt needs appt, no availabilities in schedule.

## 2023-12-01 ENCOUNTER — Other Ambulatory Visit: Payer: Self-pay | Admitting: Family Medicine

## 2023-12-01 DIAGNOSIS — I10 Essential (primary) hypertension: Secondary | ICD-10-CM

## 2023-12-02 ENCOUNTER — Encounter: Admitting: Physician Assistant

## 2023-12-02 DIAGNOSIS — I7025 Atherosclerosis of native arteries of other extremities with ulceration: Secondary | ICD-10-CM | POA: Diagnosis not present

## 2023-12-02 DIAGNOSIS — I7389 Other specified peripheral vascular diseases: Secondary | ICD-10-CM | POA: Diagnosis not present

## 2023-12-02 DIAGNOSIS — I129 Hypertensive chronic kidney disease with stage 1 through stage 4 chronic kidney disease, or unspecified chronic kidney disease: Secondary | ICD-10-CM | POA: Diagnosis not present

## 2023-12-02 DIAGNOSIS — L97522 Non-pressure chronic ulcer of other part of left foot with fat layer exposed: Secondary | ICD-10-CM | POA: Diagnosis not present

## 2023-12-02 DIAGNOSIS — N189 Chronic kidney disease, unspecified: Secondary | ICD-10-CM | POA: Diagnosis not present

## 2023-12-07 ENCOUNTER — Ambulatory Visit: Payer: Self-pay

## 2023-12-07 DIAGNOSIS — E039 Hypothyroidism, unspecified: Secondary | ICD-10-CM | POA: Diagnosis not present

## 2023-12-07 DIAGNOSIS — D45 Polycythemia vera: Secondary | ICD-10-CM | POA: Diagnosis not present

## 2023-12-07 DIAGNOSIS — I4892 Unspecified atrial flutter: Secondary | ICD-10-CM | POA: Diagnosis not present

## 2023-12-07 DIAGNOSIS — E785 Hyperlipidemia, unspecified: Secondary | ICD-10-CM | POA: Diagnosis not present

## 2023-12-07 DIAGNOSIS — N1832 Chronic kidney disease, stage 3b: Secondary | ICD-10-CM | POA: Diagnosis not present

## 2023-12-07 DIAGNOSIS — L03116 Cellulitis of left lower limb: Secondary | ICD-10-CM | POA: Diagnosis not present

## 2023-12-07 DIAGNOSIS — I1310 Hypertensive heart and chronic kidney disease without heart failure, with stage 1 through stage 4 chronic kidney disease, or unspecified chronic kidney disease: Secondary | ICD-10-CM | POA: Diagnosis not present

## 2023-12-07 DIAGNOSIS — I739 Peripheral vascular disease, unspecified: Secondary | ICD-10-CM | POA: Diagnosis not present

## 2023-12-07 DIAGNOSIS — I872 Venous insufficiency (chronic) (peripheral): Secondary | ICD-10-CM | POA: Diagnosis not present

## 2023-12-07 NOTE — Telephone Encounter (Signed)
 YI Only or Action Required?: Action required by provider: request for appointment.  Patient was last seen in primary care on 09/19/2023 by [name], MD.  Called Nurse Triage reporting Shortness of Breath  at rest and with exertion (see NT note 11/30/23)hematuria (on blood thinners).  Symptoms began hematuria off and for 2 months .  Interventions attempted: Other: requested  urology c/s, .  Symptoms are: unchanged.  Triage Disposition: PCP triage  needs appt Patient/caregiver understands and will follow disposition?: yes      Copied from CRM #[id number]. Topic: Clinical - Red Word Triage >> Dec 07, 2023  1:56 PM Wess RAMAN wrote: Red Word that prompted transfer to Nurse Triage: [place], nurse from [facility] care, Stated patient has Blood in urine, no chest pain, shortness of breath Reason for Disposition  Taking Coumadin (warfarin) or other strong blood thinner, or known bleeding disorder (e.g., thrombocytopenia)  Answer Assessment - Initial Assessment Questions 1. RESPIRATORY STATUS: Describe your breathing? (e.g., wheezing, shortness of breath, unable to speak, severe coughing)      SOB  2. ONSET: When did this breathing problem begin?      More often  3. PATTERN Does the difficult breathing come and go, or has it been constant since it started?      Comes and goes 4. SEVERITY: How bad is your breathing? (e.g., mild, moderate, severe)      Mild  5. RECURRENT SYMPTOM: Have you had difficulty breathing before? If Yes, ask: When was the last time? and What happened that time?      Since surgery 8. CAUSE: What do you think is causing the breathing problem?     Unknown  9. OTHER SYMPTOMS: Do you have any other symptoms? (e.g., chest pain, cough, dizziness, fever, runny nose)     dizziness 10. O2 SATURATION MONITOR:  Do you use an oxygen saturation monitor (pulse oximeter) at home? If Yes, ask: What is your reading (oxygen level) today? What is your usual  oxygen saturation reading? (e.g., 95%)       96%  Answer Assessment - Initial Assessment Questions 1. COLOR of URINE: Describe the color of the urine.  (e.g., tea-colored, pink, red, bloody) Do you have blood clots in your urine? (e.g., none, pea, grape, small coin)     Bright red (amber) 2. ONSET: When did the bleeding start?       2 months   3. EPISODES: How many times has there been blood in the urine? or How many times today?     Happening more frequently  4. PAIN with URINATION: Is there any pain with passing your urine? If Yes, ask: How bad is the pain?  (Scale 1-10; or mild, moderate, severe)     N/a 5. FEVER: Do you have a fever? If Yes, ask: What is your temperature, how was it measured, and when did it start?     no 6. ASSOCIATED SYMPTOMS: Are you passing urine more frequently than usual?     N/a 7. OTHER SYMPTOMS: Do you have any other symptoms? (e.g., back/flank pain, abdomen pain, vomiting)     No  Protocols used: Breathing Difficulty-A-AH, Urine - Blood In-A-AH F

## 2023-12-08 ENCOUNTER — Encounter: Admitting: Internal Medicine

## 2023-12-09 ENCOUNTER — Ambulatory Visit: Admitting: Family Medicine

## 2023-12-09 ENCOUNTER — Ambulatory Visit
Admission: RE | Admit: 2023-12-09 | Discharge: 2023-12-09 | Disposition: A | Discharge status: Home / Self Care | Attending: Family Medicine | Admitting: Family Medicine

## 2023-12-09 ENCOUNTER — Ambulatory Visit
Admission: RE | Admit: 2023-12-09 | Discharge: 2023-12-09 | Disposition: A | Discharge status: Home / Self Care | Source: Ambulatory Visit | Attending: Family Medicine | Admitting: Family Medicine

## 2023-12-09 VITALS — BP 124/78 | HR 62 | Temp 97.6°F | Ht 70.5 in | Wt 137.7 lb

## 2023-12-09 DIAGNOSIS — R0609 Other forms of dyspnea: Secondary | ICD-10-CM | POA: Insufficient documentation

## 2023-12-09 DIAGNOSIS — R634 Abnormal weight loss: Secondary | ICD-10-CM | POA: Diagnosis not present

## 2023-12-09 DIAGNOSIS — I700 Atherosclerosis of aorta: Secondary | ICD-10-CM | POA: Diagnosis not present

## 2023-12-09 DIAGNOSIS — R0602 Shortness of breath: Secondary | ICD-10-CM | POA: Diagnosis not present

## 2023-12-09 DIAGNOSIS — I771 Stricture of artery: Secondary | ICD-10-CM | POA: Diagnosis not present

## 2023-12-09 DIAGNOSIS — G589 Mononeuropathy, unspecified: Secondary | ICD-10-CM

## 2023-12-09 DIAGNOSIS — M5410 Radiculopathy, site unspecified: Secondary | ICD-10-CM

## 2023-12-09 MED ORDER — OXYCODONE-ACETAMINOPHEN 10-325 MG PO TABS: 1.0000 | ORAL_TABLET | Freq: Four times a day (QID) | ORAL | 0 refills | Status: DC | PRN

## 2023-12-09 NOTE — Patient Instructions (Signed)
Please review the attached list of medications and notify my office if there are any errors.   Please go to the lab draw station in [address] on the second floor of [facility] . Normal hours are 8:00am to 11:30am and 1:00pm to 4:00pm Monday through Friday   Go to the [facility] on [facility] for chest Xray

## 2023-12-09 NOTE — Progress Notes (Signed)
 Established patient visit   Patient: Jerry Mcpherson   DOB: 1940/11/24   83 y.o. Male  MRN: [id number] Visit Date: 12/09/2023  Today's healthcare provider: Nancyann Perry, MD   No chief complaint on file.  Subjective    Discussed the use of AI scribe software for clinical note transcription with the patient, who gave verbal consent to proceed.  History of Present Illness   Jerry Mcpherson is an 83 year old male who presents with worsening shortness of breath and weight loss.  He has experienced worsening shortness of breath over the past two months, becoming breathless with minimal exertion such as walking short distances. He has a slight cough that occasionally produces white sputum. No wheezing, swelling in his feet or ankles, fever, chills, or sweats.  He reports significant weight loss and a lack of energy, expressing concern about his inability to gain weight and improve his energy levels.  He has a history of a foot wound that is being managed with regular dressing changes by a visiting nurse twice a week. He reports that the wound doctor said his foot wound is looking better, but that he is still under his care.  He reports a recent finding of blood in his urine, noted by another physician two weeks ago, but he has not yet received a referral for further evaluation.  He describes feeling very unstable on his feet, a symptom that has developed over the last three months. An MRI of his brain is scheduled at [facility] to investigate this issue further.     Wt Readings from Last 3 Encounters:  12/09/23 137 lb 11.2 oz (62.5 kg)  09/19/23 146 lb (66.2 kg)  07/13/23 153 lb (69.4 kg)     Medications: Outpatient Medications Prior to Visit  Medication Sig   clopidogrel (PLAVIX) 75 MG tablet Take 75 mg by mouth daily.   cyproheptadine  (PERIACTIN ) 4 MG tablet TAKE 1/2-1 TABLET BY MOUTH THREE TIMES DAILY AS NEEDED TO HELP APPETITE   diclofenac  Sodium (VOLTAREN ) 1 % GEL Apply  4 g topically 4 (four) times daily as needed.   docusate sodium (COLACE) 100 MG capsule Take 1 capsule by mouth daily as needed.   ELIQUIS  5 MG TABS tablet TAKE 1 TABLET BY MOUTH TWICE A DAY   furosemide (LASIX) 20 MG tablet Take 1 tablet by mouth daily.   hydrochlorothiazide  (HYDRODIURIL ) 25 MG tablet TAKE 1 TABLET BY MOUTH EVERY DAY   hydroxyurea (HYDREA) 500 MG capsule Take 500 mg by mouth as directed. 2 tablets 3 days a week, and 1 tablet all other days   levothyroxine  (SYNTHROID ) 75 MCG tablet TAKE 1 TABLET BY MOUTH EVERY DAY   metoprolol  succinate (TOPROL -XL) 50 MG 24 hr tablet TAKE 1 TABLET BY MOUTH EVERY DAY   Multiple Vitamins-Minerals (MULTIVITAMIN ADULT PO) Take 1 tablet by mouth daily.   mupirocin  cream (BACTROBAN ) 2 % Apply 1 Application topically 2 (two) times daily.   naproxen  (NAPROSYN ) 500 MG tablet TAKE 1 TABLET(500 MG) BY MOUTH TWICE DAILY WITH A MEAL   potassium chloride  (KLOR-CON ) 10 MEQ tablet Take by mouth.   promethazine  (PHENERGAN ) 12.5 MG tablet TAKE 1 TO 2 TABLETS(12.5 TO 25 MG) BY MOUTH EVERY 8 HOURS AS NEEDED FOR NAUSEA OR VOMITING   sildenafil (VIAGRA) 50 MG tablet Take 0.5-1 tablets by mouth daily as needed.   valACYclovir  (VALTREX ) 1000 MG tablet TAKE 1 TABLET BY MOUTH TWICE A DAY AS NEEDED   oxyCODONE -acetaminophen  (PERCOCET) 10-325 MG  tablet Take 1 tablet by mouth every 6 (six) hours as needed. for pain   amLODipine  (NORVASC ) 2.5 MG tablet TAKE ONE (1) TABLET BY MOUTH ONCE DAILY (Patient not taking: Reported on 12/09/2023)   Ascorbic Acid (VITAMIN C) 1000 MG tablet Take 2 tablets by mouth daily. (Patient not taking: Reported on 12/09/2023)   aspirin  81 MG tablet Take 2 tablets daily (Patient not taking: Reported on 12/09/2023)   budesonide -formoterol  (SYMBICORT ) 160-4.5 MCG/ACT inhaler Inhale 2 puffs into the lungs 2 (two) times daily. (Patient not taking: Reported on 12/09/2023)   doxycycline  (VIBRAMYCIN ) 100 MG capsule TAKE 1 CAPSULE BY MOUTH TWICE A DAY X 14 DAYS  (Patient not taking: Reported on 12/09/2023)   fluticasone  (FLONASE ) 50 MCG/ACT nasal spray Place 2 sprays into both nostrils daily. (Patient not taking: Reported on 12/09/2023)   silver  sulfADIAZINE  (SILVADENE ) 1 % cream Apply 1 Application topically 2 (two) times daily. (Patient not taking: Reported on 12/09/2023)   triamcinolone  (KENALOG ) 0.025 % cream Apply 1 Application topically 2 (two) times daily. (Patient not taking: Reported on 12/09/2023)   No facility-administered medications prior to visit.   Review of Systems     Objective    BP 124/78   Pulse 62   Temp 97.6 F (36.4 C) (Oral)   Ht 5' 10.5 (1.791 m)   Wt 137 lb 11.2 oz (62.5 kg)   SpO2 94%   BMI 19.48 kg/m   Physical Exam   General: Appearance:    Thin male in no acute distress  Eyes:    PERRL, conjunctiva/corneas clear, EOM's intact       Lungs:     Clear to auscultation bilaterally, respirations unlabored  Heart:    Normal heart rate. Normal rhythm.  2/6 systolic murmur at right upper sternal border at apex at second left intercostal space   MS:   All extremities are intact.    Neurologic:   Awake, alert, oriented x 3. No apparent focal neurological defect.          Assessment & Plan     1. Dyspnea on exertion (Primary) He did have echo with moderate valvular disease but normal systolic function in January  - DG Chest 2 View; Future - CBC - Brain natriuretic peptide - Comprehensive metabolic panel with GFR - EKG 87-Ozji  2. Abnormal weight loss   3. Pinched nerve Responsible for radiculopathy below.   3. Radiculopathy, unspecified spinal region refill oxyCODONE -acetaminophen  (PERCOCET) 10-325 MG tablet; Take 1 tablet by mouth every 6 (six) hours as needed. for pain  Dispense: 120 tablet; Refill: 0      Nancyann Perry, MD  [facility] [phone] (phone) [phone] (fax)  [facility]

## 2023-12-12 LAB — CBC
Hematocrit: 49 % (ref 37.5–51.0)
Hemoglobin: 14.8 g/dL (ref 13.0–17.7)
MCH: 26.9 pg (ref 26.6–33.0)
MCHC: 30.2 g/dL — ABNORMAL LOW (ref 31.5–35.7)
MCV: 89 fL (ref 79–97)
Platelets: 625 x10E3/uL — ABNORMAL HIGH (ref 150–450)
RBC: 5.51 x10E6/uL (ref 4.14–5.80)
RDW: 26 % — ABNORMAL HIGH (ref 11.6–15.4)
WBC: 20.6 x10E3/uL (ref 3.4–10.8)

## 2023-12-12 LAB — COMPREHENSIVE METABOLIC PANEL WITH GFR
ALT: 5 IU/L (ref 0–44)
AST: 15 IU/L (ref 0–40)
Albumin: 4.3 g/dL (ref 3.7–4.7)
Alkaline Phosphatase: 112 IU/L (ref 44–121)
BUN/Creatinine Ratio: 15 (ref 10–24)
BUN: 30 mg/dL — ABNORMAL HIGH (ref 8–27)
Bilirubin Total: 0.8 mg/dL (ref 0.0–1.2)
CO2: 12 mmol/L — ABNORMAL LOW (ref 20–29)
Calcium: 8.8 mg/dL (ref 8.6–10.2)
Chloride: 109 mmol/L — ABNORMAL HIGH (ref 96–106)
Creatinine, Ser: 1.94 mg/dL — ABNORMAL HIGH (ref 0.76–1.27)
Globulin, Total: 2.5 g/dL (ref 1.5–4.5)
Glucose: 81 mg/dL (ref 70–99)
Potassium: 4.9 mmol/L (ref 3.5–5.2)
Sodium: 140 mmol/L (ref 134–144)
Total Protein: 6.8 g/dL (ref 6.0–8.5)
eGFR: 33 mL/min/1.73 — ABNORMAL LOW (ref >59)

## 2023-12-12 LAB — BRAIN NATRIURETIC PEPTIDE: BNP: 1624.8 pg/mL — AB (ref 0.0–100.0)

## 2023-12-13 ENCOUNTER — Ambulatory Visit: Payer: Self-pay | Admitting: Family Medicine

## 2023-12-13 DIAGNOSIS — R0609 Other forms of dyspnea: Secondary | ICD-10-CM

## 2023-12-14 ENCOUNTER — Telehealth: Payer: Self-pay

## 2023-12-14 DIAGNOSIS — H532 Diplopia: Secondary | ICD-10-CM | POA: Diagnosis not present

## 2023-12-14 DIAGNOSIS — E039 Hypothyroidism, unspecified: Secondary | ICD-10-CM | POA: Diagnosis not present

## 2023-12-14 DIAGNOSIS — I4892 Unspecified atrial flutter: Secondary | ICD-10-CM | POA: Diagnosis not present

## 2023-12-14 DIAGNOSIS — L03116 Cellulitis of left lower limb: Secondary | ICD-10-CM | POA: Diagnosis not present

## 2023-12-14 DIAGNOSIS — I872 Venous insufficiency (chronic) (peripheral): Secondary | ICD-10-CM | POA: Diagnosis not present

## 2023-12-14 DIAGNOSIS — I1310 Hypertensive heart and chronic kidney disease without heart failure, with stage 1 through stage 4 chronic kidney disease, or unspecified chronic kidney disease: Secondary | ICD-10-CM | POA: Diagnosis not present

## 2023-12-14 DIAGNOSIS — D45 Polycythemia vera: Secondary | ICD-10-CM | POA: Diagnosis not present

## 2023-12-14 DIAGNOSIS — E785 Hyperlipidemia, unspecified: Secondary | ICD-10-CM | POA: Diagnosis not present

## 2023-12-14 DIAGNOSIS — I739 Peripheral vascular disease, unspecified: Secondary | ICD-10-CM | POA: Diagnosis not present

## 2023-12-14 DIAGNOSIS — N1832 Chronic kidney disease, stage 3b: Secondary | ICD-10-CM | POA: Diagnosis not present

## 2023-12-14 NOTE — Telephone Encounter (Signed)
 Copied from CRM #[id number]. Topic: Clinical - Request for Lab/Test Order >> Dec 14, 2023 11:51 AM Delon DASEN wrote: Reason for CRM: Nurse Therisa with [facility] calling to help patient understand what the phone call was about the results this morning, he needs to be scheduled for echo-  Please call [phone] nurse Therisa, patient is hard of hearing

## 2023-12-14 NOTE — Progress Notes (Signed)
 Spoke with patient and informed him the message that provider had wrote, he verbalized understanding...SABRAhowever was concerned as to why [facility] had not found out that information.  I did stress to him that if his symptoms worsen to go to ER.

## 2023-12-15 NOTE — Telephone Encounter (Signed)
 Please see the message below.

## 2023-12-15 NOTE — Telephone Encounter (Signed)
 His BNP is elevated which is an indication of possible heart failure. He needs echo to see how his heart if functioning.

## 2023-12-16 ENCOUNTER — Inpatient Hospital Stay
Admission: EM | Admit: 2023-12-16 | Discharge: 2023-12-20 | DRG: 699 | Disposition: A | Discharge status: Home Health | Attending: Internal Medicine | Admitting: Internal Medicine

## 2023-12-16 ENCOUNTER — Observation Stay

## 2023-12-16 DIAGNOSIS — Z723 Lack of physical exercise: Secondary | ICD-10-CM

## 2023-12-16 DIAGNOSIS — Z7901 Long term (current) use of anticoagulants: Secondary | ICD-10-CM

## 2023-12-16 DIAGNOSIS — R42 Dizziness and giddiness: Secondary | ICD-10-CM | POA: Diagnosis not present

## 2023-12-16 DIAGNOSIS — I130 Hypertensive heart and chronic kidney disease with heart failure and stage 1 through stage 4 chronic kidney disease, or unspecified chronic kidney disease: Secondary | ICD-10-CM | POA: Diagnosis present

## 2023-12-16 DIAGNOSIS — Z5986 Financial insecurity: Secondary | ICD-10-CM

## 2023-12-16 DIAGNOSIS — N1832 Chronic kidney disease, stage 3b: Secondary | ICD-10-CM | POA: Diagnosis not present

## 2023-12-16 DIAGNOSIS — Z888 Allergy status to other drugs, medicaments and biological substances status: Secondary | ICD-10-CM

## 2023-12-16 DIAGNOSIS — Z86718 Personal history of other venous thrombosis and embolism: Secondary | ICD-10-CM | POA: Diagnosis not present

## 2023-12-16 DIAGNOSIS — Z825 Family history of asthma and other chronic lower respiratory diseases: Secondary | ICD-10-CM | POA: Diagnosis not present

## 2023-12-16 DIAGNOSIS — Z8673 Personal history of transient ischemic attack (TIA), and cerebral infarction without residual deficits: Secondary | ICD-10-CM | POA: Diagnosis not present

## 2023-12-16 DIAGNOSIS — Z87891 Personal history of nicotine dependence: Secondary | ICD-10-CM | POA: Diagnosis not present

## 2023-12-16 DIAGNOSIS — I480 Paroxysmal atrial fibrillation: Secondary | ICD-10-CM | POA: Diagnosis not present

## 2023-12-16 DIAGNOSIS — R55 Syncope and collapse: Secondary | ICD-10-CM | POA: Diagnosis not present

## 2023-12-16 DIAGNOSIS — I4892 Unspecified atrial flutter: Secondary | ICD-10-CM | POA: Diagnosis present

## 2023-12-16 DIAGNOSIS — Z79890 Hormone replacement therapy (postmenopausal): Secondary | ICD-10-CM | POA: Diagnosis not present

## 2023-12-16 DIAGNOSIS — Z681 Body mass index (BMI) 19 or less, adult: Secondary | ICD-10-CM

## 2023-12-16 DIAGNOSIS — Z8551 Personal history of malignant neoplasm of bladder: Secondary | ICD-10-CM

## 2023-12-16 DIAGNOSIS — Z885 Allergy status to narcotic agent status: Secondary | ICD-10-CM

## 2023-12-16 DIAGNOSIS — Z823 Family history of stroke: Secondary | ICD-10-CM

## 2023-12-16 DIAGNOSIS — E46 Unspecified protein-calorie malnutrition: Secondary | ICD-10-CM | POA: Diagnosis not present

## 2023-12-16 DIAGNOSIS — I350 Nonrheumatic aortic (valve) stenosis: Secondary | ICD-10-CM | POA: Diagnosis not present

## 2023-12-16 DIAGNOSIS — I10 Essential (primary) hypertension: Secondary | ICD-10-CM | POA: Diagnosis present

## 2023-12-16 DIAGNOSIS — Z79899 Other long term (current) drug therapy: Secondary | ICD-10-CM | POA: Diagnosis not present

## 2023-12-16 DIAGNOSIS — Z8546 Personal history of malignant neoplasm of prostate: Secondary | ICD-10-CM

## 2023-12-16 DIAGNOSIS — G319 Degenerative disease of nervous system, unspecified: Secondary | ICD-10-CM | POA: Diagnosis not present

## 2023-12-16 DIAGNOSIS — C678 Malignant neoplasm of overlapping sites of bladder: Secondary | ICD-10-CM | POA: Diagnosis present

## 2023-12-16 DIAGNOSIS — R110 Nausea: Secondary | ICD-10-CM | POA: Diagnosis not present

## 2023-12-16 DIAGNOSIS — D45 Polycythemia vera: Secondary | ICD-10-CM | POA: Diagnosis present

## 2023-12-16 DIAGNOSIS — R000 Tachycardia, unspecified: Secondary | ICD-10-CM | POA: Diagnosis not present

## 2023-12-16 DIAGNOSIS — E039 Hypothyroidism, unspecified: Secondary | ICD-10-CM | POA: Diagnosis not present

## 2023-12-16 DIAGNOSIS — T83518A Infection and inflammatory reaction due to other urinary catheter, initial encounter: Principal | ICD-10-CM | POA: Diagnosis present

## 2023-12-16 DIAGNOSIS — Z881 Allergy status to other antibiotic agents status: Secondary | ICD-10-CM

## 2023-12-16 DIAGNOSIS — Z8249 Family history of ischemic heart disease and other diseases of the circulatory system: Secondary | ICD-10-CM | POA: Diagnosis not present

## 2023-12-16 DIAGNOSIS — I739 Peripheral vascular disease, unspecified: Secondary | ICD-10-CM | POA: Diagnosis not present

## 2023-12-16 DIAGNOSIS — Z7902 Long term (current) use of antithrombotics/antiplatelets: Secondary | ICD-10-CM | POA: Diagnosis not present

## 2023-12-16 DIAGNOSIS — Z906 Acquired absence of other parts of urinary tract: Secondary | ICD-10-CM

## 2023-12-16 DIAGNOSIS — N390 Urinary tract infection, site not specified: Secondary | ICD-10-CM | POA: Diagnosis present

## 2023-12-16 DIAGNOSIS — I6782 Cerebral ischemia: Secondary | ICD-10-CM | POA: Diagnosis not present

## 2023-12-16 DIAGNOSIS — I610 Nontraumatic intracerebral hemorrhage in hemisphere, subcortical: Secondary | ICD-10-CM | POA: Diagnosis not present

## 2023-12-16 DIAGNOSIS — Y731 Therapeutic (nonsurgical) and rehabilitative gastroenterology and urology devices associated with adverse incidents: Secondary | ICD-10-CM | POA: Diagnosis present

## 2023-12-16 DIAGNOSIS — R54 Age-related physical debility: Secondary | ICD-10-CM | POA: Diagnosis present

## 2023-12-16 DIAGNOSIS — I5022 Chronic systolic (congestive) heart failure: Secondary | ICD-10-CM | POA: Diagnosis not present

## 2023-12-16 DIAGNOSIS — I82411 Acute embolism and thrombosis of right femoral vein: Secondary | ICD-10-CM

## 2023-12-16 DIAGNOSIS — Z91013 Allergy to seafood: Secondary | ICD-10-CM

## 2023-12-16 DIAGNOSIS — I080 Rheumatic disorders of both mitral and aortic valves: Secondary | ICD-10-CM | POA: Diagnosis not present

## 2023-12-16 DIAGNOSIS — Z7982 Long term (current) use of aspirin: Secondary | ICD-10-CM

## 2023-12-16 DIAGNOSIS — Z809 Family history of malignant neoplasm, unspecified: Secondary | ICD-10-CM

## 2023-12-16 LAB — CBC
HCT: 46.2 % (ref 39.0–52.0)
Hemoglobin: 14.6 g/dL (ref 13.0–17.0)
MCH: 27.3 pg (ref 26.0–34.0)
MCHC: 31.6 g/dL (ref 30.0–36.0)
MCV: 86.4 fL (ref 80.0–100.0)
Platelets: 581 K/uL — ABNORMAL HIGH (ref 150–400)
RBC: 5.35 MIL/uL (ref 4.22–5.81)
RDW: 27.3 % — ABNORMAL HIGH (ref 11.5–15.5)
WBC: 21 K/uL — ABNORMAL HIGH (ref 4.0–10.5)
nRBC: 0 % (ref 0.0–0.2)

## 2023-12-16 LAB — COMPREHENSIVE METABOLIC PANEL WITH GFR
ALT: 7 U/L (ref 0–44)
AST: 13 U/L — ABNORMAL LOW (ref 15–41)
Albumin: 3.6 g/dL (ref 3.5–5.0)
Alkaline Phosphatase: 71 U/L (ref 38–126)
Anion gap: 10 (ref 5–15)
BUN: 41 mg/dL — ABNORMAL HIGH (ref 8–23)
CO2: 16 mmol/L — ABNORMAL LOW (ref 22–32)
Calcium: 8.6 mg/dL — ABNORMAL LOW (ref 8.9–10.3)
Chloride: 111 mmol/L (ref 98–111)
Creatinine, Ser: 2.09 mg/dL — ABNORMAL HIGH (ref 0.61–1.24)
GFR, Estimated: 30 mL/min — ABNORMAL LOW (ref >60)
Glucose, Bld: 137 mg/dL — ABNORMAL HIGH (ref 70–99)
Potassium: 3.9 mmol/L (ref 3.5–5.1)
Sodium: 137 mmol/L (ref 135–145)
Total Bilirubin: 1.1 mg/dL (ref 0.0–1.2)
Total Protein: 6.9 g/dL (ref 6.5–8.1)

## 2023-12-16 LAB — TROPONIN I (HIGH SENSITIVITY)
Troponin I (High Sensitivity): 17 ng/L (ref <18)
Troponin I (High Sensitivity): 16 ng/L (ref <18)

## 2023-12-16 LAB — URINALYSIS, ROUTINE W REFLEX MICROSCOPIC
Bilirubin Urine: NEGATIVE
Glucose, UA: NEGATIVE mg/dL
Ketones, ur: NEGATIVE mg/dL
Nitrite: POSITIVE — AB
Protein, ur: 100 mg/dL — AB
RBC / HPF: >50 RBC/hpf (ref 0–5)
Specific Gravity, Urine: 1.009 (ref 1.005–1.030)
Squamous Epithelial / HPF: 0 /HPF (ref 0–5)
WBC, UA: >50 WBC/hpf (ref 0–5)
pH: 6 (ref 5.0–8.0)

## 2023-12-16 LAB — TSH: TSH: 8 u[IU]/mL — ABNORMAL HIGH (ref 0.350–4.500)

## 2023-12-16 MED ORDER — SODIUM CHLORIDE 0.9 % IV SOLN: INTRAVENOUS | Status: DC

## 2023-12-16 MED ORDER — ONDANSETRON HCL 4 MG PO TABS
4.0000 mg | ORAL_TABLET | Freq: Four times a day (QID) | ORAL | Status: DC | PRN
Start: 2023-12-16 — End: 2023-12-20

## 2023-12-16 MED ORDER — APIXABAN 2.5 MG PO TABS
2.5000 mg | ORAL_TABLET | Freq: Two times a day (BID) | ORAL | Status: DC
  Administered 2023-12-16 – 2023-12-20 (×8): 2.5 mg via ORAL
  Filled 2023-12-16 (×8): qty 1

## 2023-12-16 MED ORDER — LACTATED RINGERS IV BOLUS
1000.0000 mL | Freq: Once | INTRAVENOUS | Status: AC
  Administered 2023-12-16: 1000 mL via INTRAVENOUS

## 2023-12-16 MED ORDER — SODIUM CHLORIDE 0.9 % IV SOLN
1.0000 g | INTRAVENOUS | Status: AC
  Administered 2023-12-17 – 2023-12-18 (×2): 1 g via INTRAVENOUS
  Filled 2023-12-16 (×2): qty 10

## 2023-12-16 MED ORDER — ACETAMINOPHEN 325 MG PO TABS
650.0000 mg | ORAL_TABLET | Freq: Once | ORAL | Status: AC
  Administered 2023-12-16: 650 mg via ORAL
  Filled 2023-12-16: qty 2

## 2023-12-16 MED ORDER — ONDANSETRON HCL 4 MG/2ML IJ SOLN: 4.0000 mg | Freq: Four times a day (QID) | INTRAMUSCULAR | Status: DC | PRN

## 2023-12-16 MED ORDER — SODIUM CHLORIDE 0.9 % IV SOLN
1.0000 g | Freq: Once | INTRAVENOUS | Status: AC
  Administered 2023-12-16: 1 g via INTRAVENOUS
  Filled 2023-12-16: qty 10

## 2023-12-16 MED ORDER — SODIUM CHLORIDE 0.9% FLUSH
3.0000 mL | Freq: Two times a day (BID) | INTRAVENOUS | Status: DC
  Administered 2023-12-17 – 2023-12-20 (×7): 3 mL via INTRAVENOUS

## 2023-12-16 NOTE — ED Provider Notes (Signed)
 [facility] Provider Note    Event Date/Time   First MD Initiated Contact with Patient 12/16/23 1502     (approximate)  History   Chief Complaint: Syncope  HPI  Jerry Mcpherson is a 83 y.o. male with a past medical history of DVT, hypertension, history of intermittent atrial flutter peripheral artery disease status post stents, on Eliquis , Plavix, metoprolol  50 mg, presents to the emergency department after syncopal episodes.  According to the patient he is coming from home via EMS, states throughout the day today he has been experiencing episodes of dizziness lightheadedness and has had multiple syncopal events today where he will blackout for a few seconds per patient.  In triage patient's heart rate noted to drop significantly and patient had several seconds of seizure-like activity per triage nurse to the patient was brought back emergently to a room for my evaluation.  Here the patient is awake alert no distress.  He has no complaints besides these intermittent episodes of passing out today per patient.  Patient states he has been experiencing dizziness episodes recently and states he had an MRI recently and is awaiting results for that.  Denies any chest pain.  Denies any recent illnesses.  Physical Exam   Triage Vital Signs: ED Triage Vitals  Encounter Vitals Group     BP 12/16/23 1448 (!) 114/92     Girls Systolic BP Percentile --      Girls Diastolic BP Percentile --      Boys Systolic BP Percentile --      Boys Diastolic BP Percentile --      Pulse Rate 12/16/23 1448 (!) 34     Resp 12/16/23 1448 16     Temp 12/16/23 1448 98.3 F (36.8 C)     Temp src --      SpO2 12/16/23 1448 99 %     Weight 12/16/23 1520 135 lb (61.2 kg)     Height 12/16/23 1520 5' 10 (1.778 m)     Head Circumference --      Peak Flow --      Pain Score 12/16/23 1445 0     Pain Loc --      Pain Education --      Exclude from Growth Chart --     Most recent vital  signs: Vitals:   12/16/23 1448 12/16/23 1520  BP: (!) 114/92 107/71  Pulse: (!) 34 (!) 59  Resp: 16 (!) 22  Temp: 98.3 F (36.8 C)   SpO2: 99% 94%    General: Awake, no distress.  CV:  Good peripheral perfusion.  Irregular rhythm rate around 120 to 130 bpm. Resp:  Normal effort.  Equal breath sounds bilaterally.  Abd:  No distention.  Soft, nontender.  No rebound or guarding.  ED Results / Procedures / Treatments   EKG  EKG viewed and interpreted by myself shows what appears to be sinus tachycardia with dropped beats possibly Mobitz 1 versus 2.  No ST elevation.  Largely normal intervals, normal axis narrow QRS.  MEDICATIONS ORDERED IN ED: Medications  lactated ringers  bolus 1,000 mL (1,000 mLs Intravenous New Bag/Given 12/16/23 1523)     IMPRESSION / MDM / ASSESSMENT AND PLAN / ED COURSE  I reviewed the triage vital signs and the nursing notes.  Patient's presentation is most consistent with acute presentation with potential threat to life or bodily function.  Patient presents to the emergency department for recurrent syncopal episodes occurring today.  Patient  is in what appears to be a tacky arrhythmia possibly a Mobitz 1 versus Mobitz 2 with PACs at times dropped beats.  Heart rate throughout my evaluation varied from 90 to 140 bpm.  We will check labs including CBC chemistry cardiac enzymes.  Will IV hydrate and continue to closely monitor the patient on cardiac telemetry.  Given the patient's abnormal EKG tachyarrhythmia and multiple syncopal episodes occurring today patient will require admission once his emergency department workup has been completed and the clinical picture is more clear.  Patient is now in an atrial fibrillation rhythm around 80 to 90 bpm.  Patient's urinalysis (urostomy bag) shows signs of infection including white blood cell clumps we will treat with Rocephin  and send the urine culture.  Remainder of the lab work shows a reassuring chemistry with chronic  kidney disease largely unchanged from baseline.  Patient's CBC does show significant leukocytosis unchanged from about a week ago.  In reviewing patient's chart he has a history of a myeloproliferative disorder and is being treated per patient.  This may explain the patient's leukocytosis however could be exacerbated by current urinary infection.  Reassuringly patient's troponin is negative.  Patient has had these episodes of dizziness in the past however today is a first day that he has passed out he estimates 3-4 times including once in the waiting room that was witnessed by a nurse.  Given the recurrent syncopal episodes with atypical cardiac rhythm although now appears to be in a rate controlled atrial fibrillation we will admit to the hospital service for ongoing workup and treatment.  FINAL CLINICAL IMPRESSION(S) / ED DIAGNOSES   Syncope  Note:  This document was prepared using Dragon voice recognition software and may include unintentional dictation errors.   Dorothyann Drivers, MD 12/16/23 1744

## 2023-12-16 NOTE — ED Notes (Signed)
 Pt given a dinner tray

## 2023-12-16 NOTE — ED Triage Notes (Signed)
 Arrives via [facility] from home.  C/O episodes of dizziness, room spinning, feeling weak intermittently all morning.  Patient getting chemo treatments, unsure of frequency.  Recent MRI for heart -- awaiting results.  SR with PAC.  Takes Eliquis .  500 NS given 18 LAC 4 mg Zofran  given.    CBG: 106  P: 107-160 on monitor. 118/70

## 2023-12-16 NOTE — Progress Notes (Signed)
 PHARMACY - Dose Adjustment  Patient on Apixaban  5mg  BID Indication: Afib, hx DVT  Labs: Recent Labs    12/16/23 1510 12/16/23 1801  HGB 14.6  --   HCT 46.2  --   PLT 581*  --   CREATININE 2.09*  --   TROPONINIHS 17 16    Estimated Creatinine Clearance: 22.4 mL/min (A) (by C-G formula based on SCr of 2.09 mg/dL (H)).   Assessment/Plan: 83 yo male  hx Afib, DVT on apixaban  5mg  po BID. Given patients advanced age and renal dysfunction, will adjust dose as per manufacturere's recommendation  Plan:  Reduce apixaban  to 2.5mg  po BID  Jerry Mcpherson, PharmD Clinical Pharmacist 12/16/2023 9:15 PM

## 2023-12-16 NOTE — ED Triage Notes (Signed)
 During triage pt hr dropped to 40 and had seizure like activity for 6 seconds. Pt came back to and said that has been happening a lot lately where he blacks out for a few seconds.

## 2023-12-16 NOTE — ED Notes (Signed)
Cardiac monitoring placed.

## 2023-12-16 NOTE — ED Notes (Signed)
 CCMD notified of cardiac monitoring order.

## 2023-12-16 NOTE — H&P (Signed)
 History and Physical    Patient: Jerry Mcpherson FMW:[id number] DOB: [birth year] DOA: 12/16/2023 DOS: the patient was seen and examined on 12/16/2023 PCP: [name], MD  Patient coming from: Home  Chief Complaint: No chief complaint on file.  HPI: Jerry Mcpherson is a 83 y.o. male with medical history significant of essential hypertension, recurrent vertigo, polycythemia vera, hypothyroidism, history of DVTs, atrial fibrillation and flutter, history of bladder cancer peripheral arterial disease, UTI, who presents to the ER with multiple episode of syncopal episodes today.  Patient has not had any that before.  Patient had 2 episodes of passing out prior to coming in and another episode in the ER.  No prior history of seizure disorders.  The episode however were noted to be seizure-like lasting several seconds in the triage.  Patient did not have any postictal activities.  He is fully awake alert in no distress.  Patient apparently has had recent MRI with results pending but we do not have access to that.  Vitals appear stable no hypotension.  No orthostasis.  Initial systolic blood pressure was 96.  He does have leukocytosis but has known history of polycythemia vera.  Platelets also 581.  Patient will be admitted for further evaluation and treatment of these recurrent episodes of seizure versus syncope.  Review of Systems: As mentioned in the history of present illness. All other systems reviewed and are negative. Past Medical History:  Diagnosis Date   Acute deep vein thrombosis (DVT) of right femoral vein (HCC) 10/28/2020   US  10/24/2020 [facility] Acute right lower extremity DVT extending from the common femoral vein to  the popliteal vein.     Bladder cancer (HCC)    Genital herpes    Hyperglycemia    Hypertension    Squamous cell carcinoma    Past Surgical History:  Procedure Laterality Date   CATARACT EXTRACTION Left 10/2012   Dr. Lethea; [facility]   CYSTECTOMY W/  URETEROILEAL CONDUIT  07/24/2020   [facility] for Stage IV bladder cancer extending to prostate   EYE SURGERY Left    removed a piece of steel   FEMORAL ARTERY STENT Left 06/16/2022   [facility] stenting at [facility]   HERNIA REPAIR  12/08/2006   umbilical hernia, incarderated; Dr. Dessa   PROSTATECTOMY  4/14/2-22   [facility] for Stage IV bladder cancer extending to prostate   ROTATOR CUFF REPAIR     SQUAMOUS CELL CARCINOMA EXCISION  2008   TONSILLECTOMY     Social History:  reports that he quit smoking about 55 years ago. His smoking use included cigarettes. He started smoking about 70 years ago. He has a 45 pack-year smoking history. He has never used smokeless tobacco. He reports that he does not drink alcohol and does not use drugs.  Allergies  Allergen Reactions   Losartan Potassium     angioedema Other reaction(s): Angioedema   Shellfish Allergy Swelling and Rash    Rash, itching, swelling   Currently eats SHELLFISH without symptoms   Shellfish-Derived Products Dermatitis and Swelling    Rash, itching, swelling    Rash, itching, swelling   Currently eats SHELLFISH without symptoms   Doxycycline  Other (See Comments)    Blisters on hand   Ultram  [Tramadol Hcl] Nausea And Vomiting   Celecoxib Itching, Swelling, Rash and Other (See Comments)    Cannot take when eating shellfish   Codeine Palpitations    Heart racing   Cyclobenzaprine Other (See Comments)    Musculoskeletal  therapy agents cause Excessive sedation Musculoskeletal therapy agents cause excessive sedation   Tramadol Hcl Nausea And Vomiting    Family History  Problem Relation Age of Onset   Hypertension Mother    Heart attack Mother    Stroke Father    Cancer Father    COPD Sister    Congestive Heart Failure Brother     Prior to Admission medications   Medication Sig Start Date End Date Taking? Authorizing Provider  Ascorbic Acid (VITAMIN C) 1000 MG tablet Take 2 tablets by mouth daily. Patient not taking: Reported on  12/09/2023    [provider]  aspirin  81 MG tablet Take 2 tablets daily Patient not taking: Reported on 12/09/2023 05/15/18   [name], MD  clopidogrel (PLAVIX) 75 MG tablet Take 75 mg by mouth daily.    [provider]  cyproheptadine  (PERIACTIN ) 4 MG tablet TAKE 1/2-1 TABLET BY MOUTH THREE TIMES DAILY AS NEEDED TO HELP APPETITE 04/21/21   [name], MD  diclofenac  Sodium (VOLTAREN ) 1 % GEL Apply 4 g topically 4 (four) times daily as needed. 08/15/19   [name], MD  docusate sodium (COLACE) 100 MG capsule Take 1 capsule by mouth daily as needed.    [provider]  ELIQUIS  5 MG TABS tablet TAKE 1 TABLET BY MOUTH TWICE A DAY 08/26/23   [name], MD  furosemide (LASIX) 20 MG tablet Take 1 tablet by mouth daily. 05/06/23 05/05/24  [provider]  hydrochlorothiazide  (HYDRODIURIL ) 25 MG tablet TAKE 1 TABLET BY MOUTH EVERY DAY 12/01/23   [name], MD  hydroxyurea (HYDREA) 500 MG capsule Take 500 mg by mouth as directed. 2 tablets 3 days a week, and 1 tablet all other days    [provider]  levothyroxine  (SYNTHROID ) 75 MCG tablet TAKE 1 TABLET BY MOUTH EVERY DAY 11/06/21   [name], MD  metoprolol  succinate (TOPROL -XL) 50 MG 24 hr tablet TAKE 1 TABLET BY MOUTH EVERY DAY 08/14/23   [name], MD  Multiple Vitamins-Minerals (MULTIVITAMIN ADULT PO) Take 1 tablet by mouth daily.    [provider]  mupirocin  cream (BACTROBAN ) 2 % Apply 1 Application topically 2 (two) times daily. 10/19/23   [name], MD  naproxen  (NAPROSYN ) 500 MG tablet TAKE 1 TABLET(500 MG) BY MOUTH TWICE DAILY WITH A MEAL 02/02/19   [name], MD  oxyCODONE -acetaminophen  (PERCOCET) 10-325 MG tablet Take 1 tablet by mouth every 6 (six) hours as needed. for pain 12/09/23   [name], MD  potassium chloride  (KLOR-CON ) 10 MEQ tablet Take by mouth. 05/11/21   [provider]  promethazine  (PHENERGAN ) 12.5 MG tablet TAKE  1 TO 2 TABLETS(12.5 TO 25 MG) BY MOUTH EVERY 8 HOURS AS NEEDED FOR NAUSEA OR VOMITING 06/23/21   [name], MD  sildenafil (VIAGRA) 50 MG tablet Take 0.5-1 tablets by mouth daily as needed. 11/22/12   [provider]  silver  sulfADIAZINE  (SILVADENE ) 1 % cream Apply 1 Application topically 2 (two) times daily. Patient not taking: Reported on 12/09/2023 10/31/23   [name], MD  triamcinolone  (KENALOG ) 0.025 % cream Apply 1 Application topically 2 (two) times daily. Patient not taking: Reported on 12/09/2023 06/28/23   Van Knee, MD  valACYclovir  (VALTREX ) 1000 MG tablet TAKE 1 TABLET BY MOUTH TWICE A DAY AS NEEDED 10/02/23   [name], MD    Physical Exam: Vitals:   12/16/23 1448 12/16/23 1520 12/16/23 1520  BP: (!) 114/92 107/71   Pulse: (!) 34 (!) 59   Resp: 16 (!) 22   Temp: 98.3 F (36.8 C)    SpO2: 99% 94%   Weight:   61.2 kg  Height:   5' 10 (1.778 m)   Constitutional: Chronically ill looking, NAD, calm, comfortable Eyes: PERRL, lids and conjunctivae normal ENMT: Mucous membranes are moist. Posterior pharynx clear of any exudate or lesions.Normal dentition.  Poor hearing Neck: normal, supple, no masses, no thyromegaly Respiratory: clear to auscultation bilaterally, no wheezing, no crackles. Normal respiratory effort. No accessory muscle use.  Cardiovascular: Sinus bradycardia, no murmurs / rubs / gallops. No extremity edema. 2+ pedal pulses. No carotid bruits.  Abdomen: no tenderness, no masses palpated. No hepatosplenomegaly. Bowel sounds positive.  Musculoskeletal: Good range of motion, no joint swelling or tenderness, Skin: no rashes, lesions, ulcers. No induration Neurologic: CN 2-12 grossly intact. Sensation intact, DTR normal. Strength 5/5 in all 4.  Psychiatric: Normal judgment and insight. Alert and oriented x 3. Normal mood  Data Reviewed:  Blood pressure 96/68, pulse 34, white count 21 platelets 281, CO2 16 BUN 14 creatinine 2.09  calcium  8.6.  Urinalysis showed turbid urine with large leukocyte and positive nitrites.  MRI of the brain showed no evidence of acute recurrent abnormality with small remote lacunar infarcts and prior left thalamic hemorrhage severe bilateral maxillary sinus mucosal thickening  Assessment and Plan:  #1 recurrent syncopal episodes: Patient admitted for evaluation.  Has had MRI of the brain showing no CVA.  Will get echocardiogram, PT and OT consultation.  Patient will be hydrated.  PT and OT to evaluate.  Patient has had prior history of vertigo.  Depending on the results we may consider neurology or cardiology consultation.  #2 peripheral arterial disease: Continue supportive care.  #3 polycythemia vera: Appears to be in remission.  Continue home regimen  #4 hypothyroidism: Continue with levothyroxine   #5 essential hypertension: Continue with blood pressure monitoring.  #6 history of bladder cancer: Stable.  Continue to monitor  #7 A-flutter: Rate is controlled.  Continue other home regimen  #8 UTI: Continue with IV Rocephin .  #9 history of DVT: On anticoagulation.  Will continue Eliquis     Advance Care Planning:   Code Status: Full Code   Consults: None  Family Communication: No family at bedside  Severity of Illness: The appropriate patient status for this patient is INPATIENT. Inpatient status is judged to be reasonable and necessary in order to provide the required intensity of service to ensure the patient's safety. The patient's presenting symptoms, physical exam findings, and initial radiographic and laboratory data in the context of their chronic comorbidities is felt to place them at high risk for further clinical deterioration. Furthermore, it is not anticipated that the patient will be medically stable for discharge from the hospital within 2 midnights of admission.   * I certify that at the point of admission it is my clinical judgment that the patient will require  inpatient hospital care spanning beyond 2 midnights from the point of admission due to high intensity of service, high risk for further deterioration and high frequency of surveillance required.*  AuthorBETHA SIM KNOLL, MD 12/16/2023 6:34 PM  For on call review [email or web address].

## 2023-12-17 ENCOUNTER — Other Ambulatory Visit: Payer: Self-pay

## 2023-12-17 ENCOUNTER — Inpatient Hospital Stay
Admit: 2023-12-17 | Discharge: 2023-12-17 | Disposition: A | Discharge status: Home / Self Care | Attending: Internal Medicine | Admitting: Internal Medicine

## 2023-12-17 ENCOUNTER — Encounter: Payer: Self-pay | Admitting: Osteopathic Medicine

## 2023-12-17 DIAGNOSIS — R55 Syncope and collapse: Secondary | ICD-10-CM | POA: Diagnosis present

## 2023-12-17 LAB — ECHOCARDIOGRAM COMPLETE
AR max vel: 1.59 cm2
AV Area VTI: 1.39 cm2
AV Area mean vel: 1.26 cm2
AV Mean grad: 15.8 mmHg
AV Peak grad: 19.8 mmHg
Ao pk vel: 2.23 m/s
Area-P 1/2: 6.22 cm2
Calc EF: 45.7 %
Height: 70 in
S' Lateral: 4.5 cm
Single Plane A2C EF: 51.8 %
Single Plane A4C EF: 39.5 %
Weight: 2160 [oz_av]

## 2023-12-17 LAB — COMPREHENSIVE METABOLIC PANEL WITH GFR
ALT: 6 U/L (ref 0–44)
AST: 12 U/L — ABNORMAL LOW (ref 15–41)
Albumin: 3.3 g/dL — ABNORMAL LOW (ref 3.5–5.0)
Alkaline Phosphatase: 66 U/L (ref 38–126)
Anion gap: 5 (ref 5–15)
BUN: 41 mg/dL — ABNORMAL HIGH (ref 8–23)
CO2: 20 mmol/L — ABNORMAL LOW (ref 22–32)
Calcium: 8.2 mg/dL — ABNORMAL LOW (ref 8.9–10.3)
Chloride: 112 mmol/L — ABNORMAL HIGH (ref 98–111)
Creatinine, Ser: 2.09 mg/dL — ABNORMAL HIGH (ref 0.61–1.24)
GFR, Estimated: 30 mL/min — ABNORMAL LOW (ref >60)
Glucose, Bld: 95 mg/dL (ref 70–99)
Potassium: 3.9 mmol/L (ref 3.5–5.1)
Sodium: 137 mmol/L (ref 135–145)
Total Bilirubin: 0.8 mg/dL (ref 0.0–1.2)
Total Protein: 6.4 g/dL — ABNORMAL LOW (ref 6.5–8.1)

## 2023-12-17 LAB — CBC
HCT: 43.2 % (ref 39.0–52.0)
Hemoglobin: 13.3 g/dL (ref 13.0–17.0)
MCH: 27 pg (ref 26.0–34.0)
MCHC: 30.8 g/dL (ref 30.0–36.0)
MCV: 87.6 fL (ref 80.0–100.0)
Platelets: 460 K/uL — ABNORMAL HIGH (ref 150–400)
RBC: 4.93 MIL/uL (ref 4.22–5.81)
RDW: 27 % — ABNORMAL HIGH (ref 11.5–15.5)
WBC: 18.2 K/uL — ABNORMAL HIGH (ref 4.0–10.5)
nRBC: 0 % (ref 0.0–0.2)

## 2023-12-17 LAB — CBG MONITORING, ED: Glucose-Capillary: 91 mg/dL (ref 70–99)

## 2023-12-17 MED ORDER — LEVOTHYROXINE SODIUM 50 MCG PO TABS
75.0000 ug | ORAL_TABLET | Freq: Every day | ORAL | Status: DC
  Administered 2023-12-17 – 2023-12-20 (×4): 75 ug via ORAL
  Filled 2023-12-17 (×4): qty 1

## 2023-12-17 MED ORDER — HYDROXYUREA 500 MG PO CAPS
500.0000 mg | ORAL_CAPSULE | Freq: Every day | ORAL | Status: DC
  Administered 2023-12-18 – 2023-12-20 (×3): 500 mg via ORAL
  Filled 2023-12-17 (×3): qty 1

## 2023-12-17 MED ORDER — FERROUS SULFATE 325 (65 FE) MG PO TABS
325.0000 mg | ORAL_TABLET | Freq: Every day | ORAL | Status: DC
  Administered 2023-12-17 – 2023-12-20 (×4): 325 mg via ORAL
  Filled 2023-12-17 (×4): qty 1

## 2023-12-17 MED ORDER — DOCUSATE SODIUM 100 MG PO CAPS: 100.0000 mg | ORAL_CAPSULE | Freq: Every day | ORAL | Status: DC | PRN

## 2023-12-17 MED ORDER — DICLOFENAC SODIUM 1 % EX GEL
4.0000 g | Freq: Four times a day (QID) | CUTANEOUS | Status: DC | PRN
  Administered 2023-12-18 (×3): 4 g via TOPICAL
  Filled 2023-12-17: qty 100

## 2023-12-17 MED ORDER — ENSURE PLUS HIGH PROTEIN PO LIQD
237.0000 mL | Freq: Two times a day (BID) | ORAL | Status: DC
  Administered 2023-12-18 – 2023-12-20 (×4): 237 mL via ORAL

## 2023-12-17 MED ORDER — OXYCODONE-ACETAMINOPHEN 10-325 MG PO TABS: 1.0000 | ORAL_TABLET | Freq: Four times a day (QID) | ORAL | Status: DC | PRN

## 2023-12-17 MED ORDER — ACETAMINOPHEN 325 MG PO TABS
325.0000 mg | ORAL_TABLET | Freq: Four times a day (QID) | ORAL | Status: DC | PRN
  Administered 2023-12-17 – 2023-12-20 (×4): 325 mg via ORAL
  Filled 2023-12-17 (×4): qty 1

## 2023-12-17 MED ORDER — CLOPIDOGREL BISULFATE 75 MG PO TABS
75.0000 mg | ORAL_TABLET | Freq: Every day | ORAL | Status: DC
  Administered 2023-12-17 – 2023-12-20 (×4): 75 mg via ORAL
  Filled 2023-12-17 (×4): qty 1

## 2023-12-17 MED ORDER — ADULT MULTIVITAMIN W/MINERALS CH
1.0000 | ORAL_TABLET | Freq: Every day | ORAL | Status: DC
  Administered 2023-12-17 – 2023-12-20 (×4): 1 via ORAL
  Filled 2023-12-17 (×4): qty 1

## 2023-12-17 MED ORDER — OXYCODONE HCL 5 MG PO TABS
10.0000 mg | ORAL_TABLET | Freq: Four times a day (QID) | ORAL | Status: DC | PRN
  Filled 2023-12-17: qty 2

## 2023-12-17 NOTE — Progress Notes (Signed)
 PROGRESS NOTE    Jerry Mcpherson   FMW:[id number] DOB: 10-Mar-1941  DOA: 12/16/2023 Date of Service: 12/17/23 which is hospital day 1  PCP: Jerry Nancyann BRAVO, MD    Hospital course / significant events:   HPI: Jerry Mcpherson is a 83 y.o. male with medical history significant of essential hypertension, recurrent vertigo, polycythemia vera, hypothyroidism, history of DVTs, atrial fibrillation and flutter, history of bladder cancer peripheral arterial disease, UTI, who presents to the ER with multiple syncopal episodes. Of note, has been seen 12/09/23 by his PCP w/ concern for SOB and weight loss, pt complains of worsening fatigue for some time now. Noted high BMP, leukocytosis  09/05: admitted for syncope likely d/t UTI, continue on ceftriaxone . MRI of the brain showing no CVA. Echo pending  09/06: echo pending. PT/OT to see, continue abx and await cultures      Consultants:  none  Procedures/Surgeries: none      ASSESSMENT & PLAN:   recurrent syncopal episodes likely hypotensive d/t on BP meds and w/ UTI, malnutrition, PVD/PAD Treat UTI as below Echo PT/OT IV fluids - can dc since eating Hold antihypertensives    Peripheral arterial disease Associated ulcer LLE Plavix  + Eliquis    Elevated BNP Dyspnea Echo   Weight loss Consider CT c/a/p - Ideally would get CT w/ contrast but may need to consider MRI / outpatient f/u given renal function  CKD3b Cr appears baseline Monitor BMP Holding diuretics   Polycythemia vera followed by hematology ourpatient Appears to be stable Continue home hydroxyurea     Hypothyroidism Continue with levothyroxine    Essential hypertension Hold antihypertensives beta blocker and thiazide  History of bladder cancer:  Cystectomy, has abd pouch drains urine Change out pouch  Continue to monitor   A-flutter Rate is controlled.   Eliquis  Hold beta blocker w/ low BP Telemetry    UTI:  IV Rocephin  Await cultures     History of DVT Eliquis       Borderline underweight based on BMI: Body mass index is 19.37 kg/m.SABRA Significantly low or high BMI is associated with higher medical risk.  Pt appears frail and notes poor po intake, his albumin is low, (+)malnourished  Underweight - under 18  overweight - 25 to 29 obese - 30 or more Class 1 obesity: BMI of 30.0 to 34 Class 2 obesity: BMI of 35.0 to 39 Class 3 obesity: BMI of 40.0 to 49 Super Morbid Obesity: BMI 50-59 Super-super Morbid Obesity: BMI 60+ Healthy nutrition and physical activity advised as adjunct to other disease management and risk reduction treatments    DVT prophylaxis: eliquis  IV fluids: no continuous IV fluids  Nutrition: regular diet  Central lines / other devices: has urine pouch s/p bladder removal   Code Status: FULL CODE ACP documentation reviewed:  none on file in [facility]  TOC needs: TBD Medical barriers to dispo: await urine culture, echo. Expected medical readiness for discharge tomorrow / day afte.              Subjective / Brief ROS:  Patient reports he is concerned about his weight loss and why do I feel so tired all the time this has been going on for awhile Denies CP/SOB.  Pain controlled.  Denies new weakness.  Tolerating diet.  Reports no concerns w/ urination/defecation.   Family Communication: none at this time     Objective Findings:  Vitals:   12/17/23 1100 12/17/23 1130 12/17/23 1254 12/17/23 1314  BP: 107/71 112/83  (!) 89/55  Pulse: 78 (!) 25  85  Resp: (!) 40 19  20  Temp:   97.8 F (36.6 C) 97.7 F (36.5 C)  TempSrc:   Oral   SpO2: 99% 99%  98%  Weight:      Height:        Intake/Output Summary (Last 24 hours) at 12/17/2023 1350 Last data filed at 12/17/2023 0431 Gross per 24 hour  Intake --  Output 1025 ml  Net -1025 ml   Filed Weights   12/16/23 1520  Weight: 61.2 kg    Examination:  Physical Exam Constitutional:      General: He is not in acute distress.     Appearance: He is ill-appearing (thin/frail).     Comments: Fair hygiene - clothes are dirty, appears to have been wearing same socks for some time  Cardiovascular:     Rate and Rhythm: Normal rate. Rhythm irregular.  Pulmonary:     Effort: Pulmonary effort is normal.     Breath sounds: Normal breath sounds.  Abdominal:     General: Abdomen is flat.     Palpations: Abdomen is soft.  Musculoskeletal:     Right lower leg: No edema.     Left lower leg: No edema.  Skin:    General: Skin is warm and dry.     Coloration: Skin is pale.  Neurological:     Mental Status: He is alert and oriented to person, place, and time.  Psychiatric:        Mood and Affect: Mood normal.        Behavior: Behavior normal.          Scheduled Medications:   apixaban   2.5 mg Oral BID   clopidogrel   75 mg Oral Daily   ferrous sulfate   325 mg Oral Daily   [START ON 12/18/2023] hydroxyurea   500 mg Oral Daily   levothyroxine   75 mcg Oral Q0600   multivitamin with minerals  1 tablet Oral Daily   sodium chloride  flush  3 mL Intravenous Q12H    Continuous Infusions:  cefTRIAXone  (ROCEPHIN )  IV      PRN Medications:  oxyCODONE  **AND** acetaminophen , diclofenac  Sodium, docusate sodium , ondansetron  **OR** ondansetron  (ZOFRAN ) IV  Antimicrobials from admission:  Anti-infectives (From admission, onward)    Start     Dose/Rate Route Frequency Ordered Stop   12/17/23 1800  cefTRIAXone  (ROCEPHIN ) 1 g in sodium chloride  0.9 % 100 mL IVPB        1 g 200 mL/hr over 30 Minutes Intravenous Every 24 hours 12/16/23 1834     12/16/23 1745  cefTRIAXone  (ROCEPHIN ) 1 g in sodium chloride  0.9 % 100 mL IVPB        1 g 200 mL/hr over 30 Minutes Intravenous  Once 12/16/23 1741 12/16/23 1834           Data Reviewed:  I have personally reviewed the following...  CBC: Recent Labs  Lab 12/16/23 1510 12/17/23 0423  WBC 21.0* 18.2*  HGB 14.6 13.3  HCT 46.2 43.2  MCV 86.4 87.6  PLT 581* 460*   Basic  Metabolic Panel: Recent Labs  Lab 12/16/23 1510 12/17/23 0423  NA 137 137  K 3.9 3.9  CL 111 112*  CO2 16* 20*  GLUCOSE 137* 95  BUN 41* 41*  CREATININE 2.09* 2.09*  CALCIUM  8.6* 8.2*   GFR: Estimated Creatinine Clearance: 22.4 mL/min (A) (by C-G formula based on SCr of 2.09 mg/dL (H)). Liver Function Tests: Recent Labs  Lab  12/16/23 1510 12/17/23 0423  AST 13* 12*  ALT 7 6  ALKPHOS 71 66  BILITOT 1.1 0.8  PROT 6.9 6.4*  ALBUMIN 3.6 3.3*   No results for input(s): LIPASE, AMYLASE in the last 168 hours. No results for input(s): AMMONIA in the last 168 hours. Coagulation Profile: No results for input(s): INR, PROTIME in the last 168 hours. Cardiac Enzymes: No results for input(s): CKTOTAL, CKMB, CKMBINDEX, TROPONINI in the last 168 hours. BNP (last 3 results) No results for input(s): PROBNP in the last 8760 hours. HbA1C: No results for input(s): HGBA1C in the last 72 hours. CBG: Recent Labs  Lab 12/17/23 0421  GLUCAP 91   Lipid Profile: No results for input(s): CHOL, HDL, LDLCALC, TRIG, CHOLHDL, LDLDIRECT in the last 72 hours. Thyroid  Function Tests: Recent Labs    12/16/23 1801  TSH 8.000*   Anemia Panel: No results for input(s): VITAMINB12, FOLATE, FERRITIN, TIBC, IRON, RETICCTPCT in the last 72 hours. Most Recent Urinalysis On File:     Component Value Date/Time   COLORURINE YELLOW (A) 12/16/2023 1559   APPEARANCEUR TURBID (A) 12/16/2023 1559   LABSPEC 1.009 12/16/2023 1559   PHURINE 6.0 12/16/2023 1559   GLUCOSEU NEGATIVE 12/16/2023 1559   HGBUR MODERATE (A) 12/16/2023 1559   BILIRUBINUR NEGATIVE 12/16/2023 1559   BILIRUBINUR negative 01/09/2020 0949   KETONESUR NEGATIVE 12/16/2023 1559   PROTEINUR 100 (A) 12/16/2023 1559   UROBILINOGEN 0.2 01/09/2020 0949   NITRITE POSITIVE (A) 12/16/2023 1559   LEUKOCYTESUR LARGE (A) 12/16/2023 1559   Sepsis  Labs: @LABRCNTIP (procalcitonin:4,lacticidven:4) Microbiology: No results found for this or any previous visit (from the past 240 hours).    Radiology Studies last 3 days: ECHOCARDIOGRAM COMPLETE Result Date: 12/17/2023    ECHOCARDIOGRAM REPORT   Patient Name:   ORESTE MAJEED Bigley Date of Exam: 12/17/2023 Medical Rec #:  [id number]           Height:       70.0 in Accession #:    [id number]          Weight:       135.0 lb Date of Birth:  [birth year]           BSA:          1.766 m Patient Age:    83 years            BP:           122/73 mmHg Patient Gender: M                   HR:           95 bpm. Exam Location:  [facility] Procedure: 2D Echo, Cardiac Doppler and Color Doppler (Both Spectral and Color            Flow Doppler were utilized during procedure). Indications:     Syncope R55  History:         Patient has no prior history of Echocardiogram examinations.  Sonographer:     Thedora Louder RDCS, FASE Referring Phys:  [id number] EMERY LITTIE FUSS Diagnosing Phys: Denyse Bathe  Sonographer Comments: Technically difficult study due to poor echo windows and suboptimal parasternal window. Image acquisition challenging due to respiratory motion. This study was performed with patient sitting up. IMPRESSIONS  1. Left ventricular ejection fraction, by estimation, is 35 to 40%. The left ventricle has moderate to severely decreased function. The left ventricle demonstrates global hypokinesis. The left ventricular internal cavity size was severely dilated. There  is mild concentric left ventricular hypertrophy. Left ventricular diastolic parameters are consistent with Grade III diastolic dysfunction (restrictive).  2. Right ventricular systolic function is moderately reduced. The right ventricular size is moderately enlarged. Mildly increased right ventricular wall thickness.  3. Left atrial size was severely dilated.  4. Right atrial size was severely dilated.  5. The mitral valve is abnormal. Mild to moderate mitral valve  regurgitation.  6. The aortic valve is calcified. Aortic valve regurgitation is trivial. Moderate to severe aortic valve stenosis. Conclusion(s)/Recommendation(s): Findings consistent with severe valvular heart disease. FINDINGS  Left Ventricle: Left ventricular ejection fraction, by estimation, is 35 to 40%. The left ventricle has moderate to severely decreased function. The left ventricle demonstrates global hypokinesis. Strain was performed and the global longitudinal strain is indeterminate. The left ventricular internal cavity size was severely dilated. There is mild concentric left ventricular hypertrophy. Left ventricular diastolic parameters are consistent with Grade III diastolic dysfunction (restrictive). Right Ventricle: The right ventricular size is moderately enlarged. Mildly increased right ventricular wall thickness. Right ventricular systolic function is moderately reduced. Left Atrium: Left atrial size was severely dilated. Right Atrium: Right atrial size was severely dilated. Pericardium: There is no evidence of pericardial effusion. Mitral Valve: The mitral valve is abnormal. Mild to moderate mitral valve regurgitation. Tricuspid Valve: The tricuspid valve is grossly normal. Tricuspid valve regurgitation is trivial. Aortic Valve: Due to cardiomyopathy may have low pressure gradiant and may have severe aortic stenosis. The aortic valve is calcified. Aortic valve regurgitation is trivial. Moderate to severe aortic stenosis is present. Aortic valve mean gradient measures 15.8 mmHg. Aortic valve peak gradient measures 19.8 mmHg. Aortic valve area, by VTI measures 1.39 cm. Pulmonic Valve: The pulmonic valve was grossly normal. Pulmonic valve regurgitation is trivial. Aorta: The aortic root, ascending aorta and aortic arch are all structurally normal, with no evidence of dilitation or obstruction. IAS/Shunts: No atrial level shunt detected by color flow Doppler. Additional Comments: 3D was performed  not requiring image post processing on an independent workstation and was indeterminate.  LEFT VENTRICLE PLAX 2D LVIDd:         5.50 cm      Diastology LVIDs:         4.50 cm      LV e' medial:    4.57 cm/s LV PW:         1.30 cm      LV E/e' medial:  19.4 LV IVS:        1.30 cm      LV e' lateral:   3.37 cm/s LVOT diam:     2.20 cm      LV E/e' lateral: 26.3 LV SV:         72 LV SV Index:   41 LVOT Area:     3.80 cm  LV Volumes (MOD) LV vol d, MOD A2C: 135.0 ml LV vol d, MOD A4C: 146.0 ml LV vol s, MOD A2C: 65.1 ml LV vol s, MOD A4C: 88.3 ml LV SV MOD A2C:     69.9 ml LV SV MOD A4C:     146.0 ml LV SV MOD BP:      65.8 ml RIGHT VENTRICLE RV Basal diam:  3.50 cm RV S prime:     22.00 cm/s TAPSE (M-mode): 3.5 cm LEFT ATRIUM             Index        RIGHT ATRIUM  Index LA diam:        3.90 cm 2.21 cm/m   RA Area:     14.80 cm LA Vol (A2C):   60.3 ml 34.14 ml/m  RA Volume:   36.10 ml  20.44 ml/m LA Vol (A4C):   39.2 ml 22.19 ml/m LA Biplane Vol: 49.4 ml 27.97 ml/m  AORTIC VALVE                     PULMONIC VALVE AV Area (Vmax):    1.59 cm      PV Vmax:        0.96 m/s AV Area (Vmean):   1.26 cm      PV Peak grad:   3.7 mmHg AV Area (VTI):     1.39 cm      RVOT Peak grad: 3 mmHg AV Vmax:           222.62 cm/s AV Vmean:          184.800 cm/s AV VTI:            0.518 m AV Peak Grad:      19.8 mmHg AV Mean Grad:      15.8 mmHg LVOT Vmax:         93.40 cm/s LVOT Vmean:        61.200 cm/s LVOT VTI:          0.190 m LVOT/AV VTI ratio: 0.37  AORTA Ao Root diam: 3.10 cm Ao Asc diam:  3.30 cm MITRAL VALVE               TRICUSPID VALVE MV Area (PHT): 6.22 cm    TR Peak grad:   10.5 mmHg MV Decel Time: 122 msec    TR Vmax:        162.00 cm/s MV E velocity: 88.60 cm/s                            SHUNTS                            Systemic VTI:  0.19 m                            Systemic Diam: 2.20 cm Denyse Bathe Electronically signed by Denyse Bathe Signature Date/Time: 12/17/2023/1:12:32 PM    Final    MR BRAIN  WO CONTRAST Result Date: 12/16/2023 CLINICAL DATA:  Syncope/presyncope, cerebrovascular cause suspected EXAM: MRI HEAD WITHOUT CONTRAST TECHNIQUE: Multiplanar, multiecho pulse sequences of the brain and surrounding structures were obtained without intravenous contrast. COMPARISON:  MRI head 03/24/2018. FINDINGS: Brain: No acute infarction, acute hemorrhage, hydrocephalus, extra-axial collection or mass lesion. Small remote right cerebellar infarct. Small remote left corona radiata lacunar infarct and mild for age chronic microvascular ischemic disease. Focus susceptibility artifact in the left thalamus, compatible with prior hemorrhage. Cerebral atrophy. Vascular: Major arterial flow voids are maintained at the skull base. Skull and upper cervical spine: Normal marrow signal. Sinuses/Orbits: Severe bilateral maxillary sinus mucosal thickening. No acute orbital findings. IMPRESSION: 1. No evidence of acute intracranial abnormality. 2. Small remote lacunar infarcts and prior left thalamic hemorrhage. 3. Severe bilateral maxillary sinus mucosal thickening. Electronically Signed   By: Gilmore GORMAN Molt M.D.   On: 12/16/2023 20:36       Time spent: 50 min     Jhade Berko, DO  [facility] 12/17/2023, 1:50 PM    Dictation software may have been used to generate the above note. Typos may occur and escape review in typed/dictated notes. Please contact Dr Marsa directly for clarity if needed.  Staff may message me via secure chat in Epic  but this may not receive an immediate response,  please page me for urgent matters!  If 7PM-7AM, please contact night coverage [email or web address]

## 2023-12-17 NOTE — Progress Notes (Signed)
  Echocardiogram 2D Echocardiogram has been performed.  Jerry Mcpherson 12/17/2023, 9:23 AM

## 2023-12-17 NOTE — Evaluation (Signed)
 Physical Therapy Evaluation Patient Details Name: Jerry Mcpherson MRN: [id number] DOB: [birth year] Today's Date: 12/17/2023  History of Present Illness  83 y/o male presented to ED on 12/17/23 for dizziness and weakness. Seizure activity noted in ED. Admitted for recurrent syncopal episodes. PMH: HTN, recurrent vertigo, hx of DVT, Afib and flutter, hx of bladder cancer, PAD  Clinical Impression  Patient admitted with the above. PTA, patient lives alone and was modI with no AD per patient report. Patient presents with weakness, impaired balance, and decreased activity tolerance. Patient able to complete bed mobility modI. Stood from EOB with supervision and RW. Ambulated ~70' with RW and supervision with cues to maintain body within RW. Patient seem close to baseline. Encouraged use of RW at home for safety and to prevent falls. MD present at end of session. Patient will benefit from skilled PT services during acute stay to address listed deficits.       If plan is discharge home, recommend the following: Assistance with cooking/housework;Assist for transportation;Help with stairs or ramp for entrance   Can travel by private vehicle        Equipment Recommendations Rolling Sueanne Maniaci (2 wheels)  Recommendations for Other Services       Functional Status Assessment Patient has had a recent decline in their functional status and demonstrates the ability to make significant improvements in function in a reasonable and predictable amount of time.     Precautions / Restrictions Precautions Precautions: Fall Recall of Precautions/Restrictions: Intact Restrictions Weight Bearing Restrictions Per Provider Order: No      Mobility  Bed Mobility Overal bed mobility: Modified Independent                  Transfers Overall transfer level: Needs assistance Equipment used: Rolling Emeterio Balke (2 wheels) Transfers: Sit to/from Stand Sit to Stand: Supervision                 Ambulation/Gait Ambulation/Gait assistance: Supervision Gait Distance (Feet): 70 Feet Assistive device: Rolling Nolie Bignell (2 wheels) Gait Pattern/deviations: Step-through pattern, Decreased stride length Gait velocity: decreased     General Gait Details: cues for maintaining body within Jakhai Fant  Stairs            Wheelchair Mobility     Tilt Bed    Modified Rankin (Stroke Patients Only)       Balance Overall balance assessment: Mild deficits observed, not formally tested                                           Pertinent Vitals/Pain Pain Assessment Pain Assessment: No/denies pain    Home Living Family/patient expects to be discharged to:: Private residence Living Arrangements: Alone Available Help at Discharge: Family;Friend(s);Available PRN/intermittently Type of Home: House Home Access: Stairs to enter       Home Layout: One level Home Equipment: Agricultural consultant (2 wheels)      Prior Function Prior Level of Function : Independent/Modified Independent             Mobility Comments: reports using no AD ADLs Comments: independent with ADLs per patient     Extremity/Trunk Assessment   Upper Extremity Assessment Upper Extremity Assessment: Generalized weakness    Lower Extremity Assessment Lower Extremity Assessment: Generalized weakness       Communication   Communication Communication: No apparent difficulties    Cognition Arousal: Alert Behavior During Therapy:  WFL for tasks assessed/performed   PT - Cognitive impairments: No apparent impairments                         Following commands: Intact       Cueing       General Comments      Exercises     Assessment/Plan    PT Assessment Patient needs continued PT services  PT Problem List Decreased strength;Decreased activity tolerance;Decreased mobility;Decreased balance;Decreased safety awareness;Decreased knowledge of use of DME;Decreased  knowledge of precautions       PT Treatment Interventions DME instruction;Gait training;Therapeutic activities;Therapeutic exercise;Functional mobility training;Stair training;Balance training;Neuromuscular re-education;Patient/family education    PT Goals (Current goals can be found in the Care Plan section)  Acute Rehab PT Goals Patient Stated Goal: to go home PT Goal Formulation: With patient Time For Goal Achievement: 12/31/23 Potential to Achieve Goals: Good    Frequency Min 1X/week     Co-evaluation               AM-PAC PT 6 Clicks Mobility  Outcome Measure Help needed turning from your back to your side while in a flat bed without using bedrails?: None Help needed moving from lying on your back to sitting on the side of a flat bed without using bedrails?: None Help needed moving to and from a bed to a chair (including a wheelchair)?: A Little Help needed standing up from a chair using your arms (e.g., wheelchair or bedside chair)?: A Little Help needed to walk in hospital room?: A Little Help needed climbing 3-5 steps with a railing? : A Little 6 Click Score: 20    End of Session   Activity Tolerance: Patient tolerated treatment well Patient left: in bed;with call bell/phone within reach;Other (comment) (MD present) Nurse Communication: Mobility status PT Visit Diagnosis: Unsteadiness on feet (R26.81);Muscle weakness (generalized) (M62.81);Other abnormalities of gait and mobility (R26.89)    Time: 8874-8861 PT Time Calculation (min) (ACUTE ONLY): 13 min   Charges:   PT Evaluation $PT Eval Low Complexity: 1 Low   PT General Charges $$ ACUTE PT VISIT: 1 Visit         Maryanne Finder, PT, DPT Physical Therapist - [facility]  [facility]  Ananiah Maciolek A Naveya Ellerman 12/17/2023, 1:45 PM

## 2023-12-17 NOTE — ED Notes (Signed)
 Urostomy emptied of 400 ml's clear yellow urine.

## 2023-12-17 NOTE — Hospital Course (Signed)
 Hospital course / significant events:   HPI: Jerry Mcpherson is a 83 y.o. male with medical history significant of essential hypertension, recurrent vertigo, polycythemia vera, hypothyroidism, history of DVTs, atrial fibrillation and flutter, history of bladder cancer peripheral arterial disease, UTI, who presents to the ER with multiple syncopal episodes. Of note, has been seen 12/09/23 by his PCP w/ concern for SOB and weight loss, pt complains of worsening fatigue for some time now. Noted high BMP, leukocytosis  09/05: admitted for syncope likely d/t UTI, continue on ceftriaxone . MRI of the brain showing no CVA. Echo pending  09/06: echo pending. PT/OT to see, continue abx and await cultures      Consultants:  none  Procedures/Surgeries: none      ASSESSMENT & PLAN:   recurrent syncopal episodes likely hypotensive d/t on BP meds and w/ UTI, malnutrition, PVD/PAD Treat UTI as below Echo PT/OT IV fluids - can dc since eating Hold antihypertensives    Peripheral arterial disease Associated ulcer LLE Plavix  + Eliquis    Elevated BNP Dyspnea Echo   Weight loss Consider CT c/a/p - Ideally would get CT w/ contrast but may need to consider MRI / outpatient f/u given renal function  CKD 3a/ borderline 3b Cr appears baseline Monitor BMP Holding diuretics   Polycythemia vera followed by hematology ourpatient Appears to be stable Continue home hydroxyurea     Hypothyroidism Continue with levothyroxine    Essential hypertension Hold antihypertensives beta blocker and thiazide  History of bladder cancer:  Cystectomy, has abd pouch drains urine Change out pouch  Continue to monitor   A-flutter Rate is controlled.   Eliquis  Hold beta blocker w/ low BP Telemetry    UTI:  IV Rocephin  Await cultures    History of DVT Eliquis       Borderline underweight based on BMI: Body mass index is 19.37 kg/m.SABRA Significantly low or high BMI is associated with higher  medical risk.  Pt appears frail and notes poor po intake, his albumin is low, (+)malnourished  Underweight - under 18  overweight - 25 to 29 obese - 30 or more Class 1 obesity: BMI of 30.0 to 34 Class 2 obesity: BMI of 35.0 to 39 Class 3 obesity: BMI of 40.0 to 49 Super Morbid Obesity: BMI 50-59 Super-super Morbid Obesity: BMI 60+ Healthy nutrition and physical activity advised as adjunct to other disease management and risk reduction treatments    DVT prophylaxis: eliquis  IV fluids: no continuous IV fluids  Nutrition: regular diet  Central lines / other devices: has urine pouch s/p bladder removal   Code Status: FULL CODE ACP documentation reviewed:  none on file in [facility]  TOC needs: TBD Medical barriers to dispo: await urine culture, echo. Expected medical readiness for discharge tomorrow / day afte.

## 2023-12-17 NOTE — ED Notes (Signed)
 Patient provided with ear plugs at this time per request.

## 2023-12-18 ENCOUNTER — Encounter: Payer: Self-pay | Admitting: Osteopathic Medicine

## 2023-12-18 DIAGNOSIS — R55 Syncope and collapse: Secondary | ICD-10-CM | POA: Diagnosis not present

## 2023-12-18 LAB — CBC WITH DIFFERENTIAL/PLATELET
Abs Immature Granulocytes: 0.28 K/uL — ABNORMAL HIGH (ref 0.00–0.07)
Basophils Absolute: 0.4 K/uL — ABNORMAL HIGH (ref 0.0–0.1)
Basophils Relative: 2 %
Eosinophils Absolute: 0.6 K/uL — ABNORMAL HIGH (ref 0.0–0.5)
Eosinophils Relative: 3 %
HCT: 43.8 % (ref 39.0–52.0)
Hemoglobin: 13.9 g/dL (ref 13.0–17.0)
Immature Granulocytes: 1 %
Lymphocytes Relative: 8 %
Lymphs Abs: 1.6 K/uL (ref 0.7–4.0)
MCH: 27.5 pg (ref 26.0–34.0)
MCHC: 31.7 g/dL (ref 30.0–36.0)
MCV: 86.7 fL (ref 80.0–100.0)
Monocytes Absolute: 1.3 K/uL — ABNORMAL HIGH (ref 0.1–1.0)
Monocytes Relative: 6 %
Neutro Abs: 15.9 K/uL — ABNORMAL HIGH (ref 1.7–7.7)
Neutrophils Relative %: 80 %
Platelets: 541 K/uL — ABNORMAL HIGH (ref 150–400)
RBC: 5.05 MIL/uL (ref 4.22–5.81)
RDW: 27 % — ABNORMAL HIGH (ref 11.5–15.5)
Smear Review: NORMAL
WBC: 19.9 K/uL — ABNORMAL HIGH (ref 4.0–10.5)
nRBC: 0 % (ref 0.0–0.2)

## 2023-12-18 LAB — BASIC METABOLIC PANEL WITH GFR
Anion gap: 10 (ref 5–15)
BUN: 44 mg/dL — ABNORMAL HIGH (ref 8–23)
CO2: 16 mmol/L — ABNORMAL LOW (ref 22–32)
Calcium: 8.3 mg/dL — ABNORMAL LOW (ref 8.9–10.3)
Chloride: 112 mmol/L — ABNORMAL HIGH (ref 98–111)
Creatinine, Ser: 1.86 mg/dL — ABNORMAL HIGH (ref 0.61–1.24)
GFR, Estimated: 35 mL/min — ABNORMAL LOW (ref >60)
Glucose, Bld: 100 mg/dL — ABNORMAL HIGH (ref 70–99)
Potassium: 4 mmol/L (ref 3.5–5.1)
Sodium: 138 mmol/L (ref 135–145)

## 2023-12-18 LAB — GLUCOSE, CAPILLARY: Glucose-Capillary: 117 mg/dL — ABNORMAL HIGH (ref 70–99)

## 2023-12-18 LAB — URINE CULTURE: Culture: 50000 — AB

## 2023-12-18 MED ORDER — ENALAPRIL MALEATE 2.5 MG PO TABS
2.5000 mg | ORAL_TABLET | Freq: Every day | ORAL | Status: DC
  Administered 2023-12-18 – 2023-12-19 (×2): 2.5 mg via ORAL
  Filled 2023-12-18 (×3): qty 1

## 2023-12-18 MED ORDER — DAPAGLIFLOZIN PROPANEDIOL 10 MG PO TABS
10.0000 mg | ORAL_TABLET | Freq: Every day | ORAL | Status: DC
  Administered 2023-12-18 – 2023-12-19 (×2): 10 mg via ORAL
  Filled 2023-12-18 (×2): qty 1

## 2023-12-18 MED ORDER — CIPROFLOXACIN HCL 500 MG PO TABS
500.0000 mg | ORAL_TABLET | Freq: Every day | ORAL | Status: DC
  Administered 2023-12-19 – 2023-12-20 (×2): 500 mg via ORAL
  Filled 2023-12-18 (×2): qty 1

## 2023-12-18 NOTE — Progress Notes (Signed)
 Spoke with CCMD via telephone call due to pt c/o I'm having one of them spells; pt's face turned red and his HR was greater than 150 and then back down in the 80's; CCMD indicated this is not the 1st time his heart rate has been elevated and then back down; ? If his rhythm changed or was it just the HR; CCMD to review

## 2023-12-18 NOTE — Plan of Care (Signed)
  Problem: Pain Managment: Goal: General experience of comfort will improve and/or be controlled Outcome: Progressing   Problem: Safety: Goal: Ability to remain free from injury will improve Outcome: Progressing

## 2023-12-18 NOTE — Discharge Instructions (Signed)
 We have been treating you in the hospital for the following:  1) urinary infection - please finish the ciprofloxacin  antibiotics. DO NOT TAKE WITHIN 2 HOURS of :  Ferrous Sulfate  or Multivitamin , or Dairy products, or Ensure.  2) congestive heart failure - your heart is not pumping as strong as it used to and it doesn't relax as well as it used to. This can affect how your heart pumps blood to the rest of your body, and it can reduce your energy levels. Your cardiologist will check up on this.   3) aortic stenosis - one of the valves in your heart is tight and that is also affecting blood flow through the heart. This can also cause tiredness and sometimes dizziness with drops in blood pressure. There are procedures that might be able to fix this valve. Your cardiologist will check up on this.   4) atrial fibrillation - abnormal electrical signal causes the heart to beat too fast sometimes. When this happens, you can get dizzy / light headed / drop blood pressure. We think your passing-out spells are because of this problem. This is why we recommend you do NOT drive until or unless your cardiologist allows you to drive. We have placed a heart monitor to check on this. We have started a new medicine called amiodarone , which has already started to work. Your cardiologist will check up on this.     PLEASE CALL YOUR CARDIOLOGIST TO CONFIRM YOUR FOLLOW UP APPOINTMENT

## 2023-12-18 NOTE — Progress Notes (Signed)
 PROGRESS NOTE    Jerry Mcpherson   FMW:[id number] DOB: 23-Jul-1940  DOA: 12/16/2023 Date of Service: 12/18/23 which is hospital day 2  PCP: [name], MD    Hospital course / significant events:   HPI: Jerry Mcpherson is a 83 y.o. male with medical history significant of essential hypertension, recurrent vertigo, polycythemia vera, hypothyroidism, history of DVTs, atrial fibrillation and flutter, history of bladder cancer peripheral arterial disease, UTI, who presents to the ER with multiple syncopal episodes. Of note, has been seen 12/09/23 by his PCP w/ concern for SOB and weight loss, pt complains of worsening fatigue for some time now. Noted high BMP, leukocytosis  09/05: admitted for syncope likely d/t UTI, continue on ceftriaxone . MRI of the brain showing no CVA. Echo pending  09/06: echo pending. Did well w/ PT/OT ok for outpatinet f/u. Continue abx and await cultures  09/07: Echo LVEF 35-40, global hypokinesis and severely dilated, grade III diastolic df --> cardiology consult new dx CHF. UCx (+)klebsiella and citrobacter (S)Cipro       Consultants:  Cardiology   Procedures/Surgeries: none      ASSESSMENT & PLAN:   recurrent syncopal episodes likely hypotensive d/t on BP meds and w/ UTI, malnutrition, PVD/PAD Treat UTI as below Echo (+)CHF see below PT/OT IV fluids - can dc since eating Hold antihypertensives    Question Afib / paroxysmal noted on telemetry  Cardiology to follow  May need cardiac monitoring on discharge?   Complicated UTI Hx cystectomy d/t bladder mass UCx (+) Klebsiella 50K/mL CFU UCx (+) Citrobacter >100K/mL CFU Both sensitive to ceftriaxone  which he's been getting Both sensitive to po cipro , tmp/smx Ceftriaxone  --> cipro  for complicated UTI, pharmacy to confirm renal dose   New diagnosis HFrEF w/ grade III diastolic dysfunction Mod-Severe aortic stenosis  Mild-Mod mitral regurg  LVEF 35-40, LV global hypokinesis and  severely dilated, grade III diastolic df  Cardiology consult  Careful diuresis w/ renal function and hypotension ?ischemic workup per cardiology  Have held beta blocker / antihypertensive d/t low BP  Cardiology has added farxiga  10 mg and enalapril  2.5 mg   Peripheral arterial disease Associated ulcer LLE Plavix  + Eliquis   Weight loss, ongoing/chronic Fatigue, ongoing/chronic Certainly UTI and CHF can contribute to this  Consider CT c/a/p - Ideally would get CT w/ contrast but may need to consider MRI / outpatient f/u given renal function  CKD3b Cr appears baseline Monitor BMP Holding diuretics pending cardiology recs   Polycythemia vera followed by hematology ourpatient Appears to be stable Continue home hydroxyurea     Hypothyroidism Continue with levothyroxine    Essential hypertension Hold antihypertensives beta blocker and thiazide d/t low BP / renal function Other cardiac meds as above re: CHF dx  History of bladder cancer:  Cystectomy, has abd pouch drains urine Change out pouch  Continue to monitor   A-flutter Rate is controlled.   Eliquis  Hold beta blocker w/ low BP Telemetry    UTI:  IV Rocephin  Await cultures    History of DVT Eliquis       Borderline underweight based on BMI: Body mass index is 19.37 kg/m.SABRA Significantly low or high BMI is associated with higher medical risk.  Pt appears frail and notes poor po intake, his albumin is low, (+)malnourished  Underweight - under 18  overweight - 25 to 29 obese - 30 or more Class 1 obesity: BMI of 30.0 to 34 Class 2 obesity: BMI of 35.0 to 39 Class 3 obesity: BMI of 40.0 to  49 Super Morbid Obesity: BMI 50-59 Super-super Morbid Obesity: BMI 60+ Healthy nutrition and physical activity advised as adjunct to other disease management and risk reduction treatments    DVT prophylaxis: eliquis  IV fluids: no continuous IV fluids  Nutrition: regular diet  Central lines / other devices: has urine pouch  s/p bladder removal   Code Status: FULL CODE ACP documentation reviewed:  none on file in [facility]  TOC needs: TBD Medical barriers to dispo: await cardiology recs Expected medical readiness for discharge tomorrow / day afte.              Subjective / Brief ROS:  Patient reports he is still feeling tired today Denies CP/SOB.  Pain controlled.  Denies new weakness.  Tolerating diet.  Reports no concerns w/ urination/defecation.   Family Communication: none at this time - pt declined call to family      Objective Findings:  Vitals:   12/18/23 0436 12/18/23 0500 12/18/23 0905 12/18/23 1236  BP: 113/71  106/76 101/67  Pulse: (!) 53  (!) 53 84  Resp:   18 18  Temp: 98.4 F (36.9 C)  97.6 F (36.4 C) 97.7 F (36.5 C)  TempSrc: Oral  Oral   SpO2: 96%  98% 98%  Weight:  59.9 kg    Height:        Intake/Output Summary (Last 24 hours) at 12/18/2023 1248 Last data filed at 12/18/2023 1232 Gross per 24 hour  Intake 1057 ml  Output 2125 ml  Net -1068 ml   Filed Weights   12/16/23 1520 12/17/23 1314 12/18/23 0500  Weight: 61.2 kg 56.6 kg 59.9 kg    Examination:  Physical Exam Constitutional:      General: He is not in acute distress.    Appearance: He is ill-appearing (thin/frail).     Comments: Fair hygiene - clothes are dirty, appears to have been wearing same socks for some time  Cardiovascular:     Rate and Rhythm: Normal rate. Rhythm irregular.  Pulmonary:     Effort: Pulmonary effort is normal.     Breath sounds: Normal breath sounds.  Abdominal:     General: Abdomen is flat.     Palpations: Abdomen is soft.  Musculoskeletal:     Right lower leg: No edema.     Left lower leg: No edema.  Skin:    General: Skin is warm and dry.     Coloration: Skin is pale.  Neurological:     Mental Status: He is alert and oriented to person, place, and time.  Psychiatric:        Mood and Affect: Mood normal.        Behavior: Behavior normal.           Scheduled Medications:   apixaban   2.5 mg Oral BID   [START ON 12/19/2023] ciprofloxacin   500 mg Oral Q breakfast   clopidogrel   75 mg Oral Daily   dapagliflozin  propanediol  10 mg Oral Daily   enalapril   2.5 mg Oral Daily   feeding supplement  237 mL Oral BID BM   ferrous sulfate   325 mg Oral Daily   hydroxyurea   500 mg Oral Daily   levothyroxine   75 mcg Oral Q0600   multivitamin with minerals  1 tablet Oral Daily   sodium chloride  flush  3 mL Intravenous Q12H    Continuous Infusions:  cefTRIAXone  (ROCEPHIN )  IV Stopped (12/17/23 1845)    PRN Medications:  oxyCODONE  **AND** acetaminophen , diclofenac  Sodium, docusate sodium ,  ondansetron  **OR** ondansetron  (ZOFRAN ) IV  Antimicrobials from admission:  Anti-infectives (From admission, onward)    Start     Dose/Rate Route Frequency Ordered Stop   12/19/23 0800  ciprofloxacin  (CIPRO ) tablet 500 mg       Note to Pharmacy: Please check me on renal dosing, thanks!   500 mg Oral Daily with breakfast 12/18/23 1243     12/17/23 1800  cefTRIAXone  (ROCEPHIN ) 1 g in sodium chloride  0.9 % 100 mL IVPB        1 g 200 mL/hr over 30 Minutes Intravenous Every 24 hours 12/16/23 1834 12/18/23 2359   12/16/23 1745  cefTRIAXone  (ROCEPHIN ) 1 g in sodium chloride  0.9 % 100 mL IVPB        1 g 200 mL/hr over 30 Minutes Intravenous  Once 12/16/23 1741 12/16/23 1834           Data Reviewed:  I have personally reviewed the following...  CBC: Recent Labs  Lab 12/16/23 1510 12/17/23 0423 12/18/23 0443  WBC 21.0* 18.2* 19.9*  NEUTROABS  --   --  15.9*  HGB 14.6 13.3 13.9  HCT 46.2 43.2 43.8  MCV 86.4 87.6 86.7  PLT 581* 460* 541*   Basic Metabolic Panel: Recent Labs  Lab 12/16/23 1510 12/17/23 0423 12/18/23 0443  NA 137 137 138  K 3.9 3.9 4.0  CL 111 112* 112*  CO2 16* 20* 16*  GLUCOSE 137* 95 100*  BUN 41* 41* 44*  CREATININE 2.09* 2.09* 1.86*  CALCIUM  8.6* 8.2* 8.3*   GFR: Estimated Creatinine Clearance (by  C-G formula based on SCr of 1.86 mg/dL (H)) Male: 79.0 mL/min (A) Male: 24.6 mL/min (A) Liver Function Tests: Recent Labs  Lab 12/16/23 1510 12/17/23 0423  AST 13* 12*  ALT 7 6  ALKPHOS 71 66  BILITOT 1.1 0.8  PROT 6.9 6.4*  ALBUMIN 3.6 3.3*   No results for input(s): LIPASE, AMYLASE in the last 168 hours. No results for input(s): AMMONIA in the last 168 hours. Coagulation Profile: No results for input(s): INR, PROTIME in the last 168 hours. Cardiac Enzymes: No results for input(s): CKTOTAL, CKMB, CKMBINDEX, TROPONINI in the last 168 hours. BNP (last 3 results) No results for input(s): PROBNP in the last 8760 hours. HbA1C: No results for input(s): HGBA1C in the last 72 hours. CBG: Recent Labs  Lab 12/17/23 0421 12/18/23 0432  GLUCAP 91 117*   Lipid Profile: No results for input(s): CHOL, HDL, LDLCALC, TRIG, CHOLHDL, LDLDIRECT in the last 72 hours. Thyroid  Function Tests: Recent Labs    12/16/23 1801  TSH 8.000*   Anemia Panel: No results for input(s): VITAMINB12, FOLATE, FERRITIN, TIBC, IRON, RETICCTPCT in the last 72 hours. Most Recent Urinalysis On File:     Component Value Date/Time   COLORURINE YELLOW (A) 12/16/2023 1559   APPEARANCEUR TURBID (A) 12/16/2023 1559   LABSPEC 1.009 12/16/2023 1559   PHURINE 6.0 12/16/2023 1559   GLUCOSEU NEGATIVE 12/16/2023 1559   HGBUR MODERATE (A) 12/16/2023 1559   BILIRUBINUR NEGATIVE 12/16/2023 1559   BILIRUBINUR negative 01/09/2020 0949   KETONESUR NEGATIVE 12/16/2023 1559   PROTEINUR 100 (A) 12/16/2023 1559   UROBILINOGEN 0.2 01/09/2020 0949   NITRITE POSITIVE (A) 12/16/2023 1559   LEUKOCYTESUR LARGE (A) 12/16/2023 1559   Sepsis Labs: @LABRCNTIP (procalcitonin:4,lacticidven:4) Microbiology: Recent Results (from the past 240 hours)  Urine Culture     Status: Abnormal   Collection Time: 12/16/23  3:59 PM   Specimen: Urine, Suprapubic  Result Value Ref Range Status  Specimen Description   Final    URINE, SUPRAPUBIC Performed at [facility], [address] [place], [place] [address]    Special Requests   Final    NONE Performed at [facility], [address] [place], [place] [address]    Culture (A)  Final    50,000 COLONIES/mL KLEBSIELLA PNEUMONIAE >=100,000 COLONIES/mL CITROBACTER KOSERI    Report Status 12/18/2023 FINAL  Final   Organism ID, Bacteria KLEBSIELLA PNEUMONIAE (A)  Final   Organism ID, Bacteria CITROBACTER KOSERI (A)  Final      Susceptibility   Citrobacter koseri - MIC*    CEFEPIME <=0.12 SENSITIVE Sensitive     ERTAPENEM <=0.12 SENSITIVE Sensitive     CEFTRIAXONE  <=0.25 SENSITIVE Sensitive     CIPROFLOXACIN  <=0.06 SENSITIVE Sensitive     GENTAMICIN <=1 SENSITIVE Sensitive     NITROFURANTOIN 32 SENSITIVE Sensitive     TRIMETH/SULFA <=20 SENSITIVE Sensitive     PIP/TAZO Value in next row Sensitive ug/mL     <=4 SENSITIVEThis is a modified FDA-approved test that has been validated and its performance characteristics determined by the reporting laboratory.  This laboratory is certified under the Clinical Laboratory Improvement Amendments CLIA as qualified to perform high complexity clinical laboratory testing.    MEROPENEM Value in next row Sensitive      <=4 SENSITIVEThis is a modified FDA-approved test that has been validated and its performance characteristics determined by the reporting laboratory.  This laboratory is certified under the Clinical Laboratory Improvement Amendments CLIA as qualified to perform high complexity clinical laboratory testing.    * >=100,000 COLONIES/mL CITROBACTER KOSERI   Klebsiella pneumoniae - MIC*    AMPICILLIN Value in next row Resistant      <=4 SENSITIVEThis is a modified FDA-approved test that has been validated and its performance characteristics determined by the reporting laboratory.  This laboratory is certified under the Clinical Laboratory Improvement Amendments CLIA  as qualified to perform high complexity clinical laboratory testing.    CEFAZOLIN  (URINE) Value in next row Sensitive      2 SENSITIVEThis is a modified FDA-approved test that has been validated and its performance characteristics determined by the reporting laboratory.  This laboratory is certified under the Clinical Laboratory Improvement Amendments CLIA as qualified to perform high complexity clinical laboratory testing.    CEFEPIME Value in next row Sensitive      2 SENSITIVEThis is a modified FDA-approved test that has been validated and its performance characteristics determined by the reporting laboratory.  This laboratory is certified under the Clinical Laboratory Improvement Amendments CLIA as qualified to perform high complexity clinical laboratory testing.    ERTAPENEM Value in next row Sensitive      2 SENSITIVEThis is a modified FDA-approved test that has been validated and its performance characteristics determined by the reporting laboratory.  This laboratory is certified under the Clinical Laboratory Improvement Amendments CLIA as qualified to perform high complexity clinical laboratory testing.    CEFTRIAXONE  Value in next row Sensitive      2 SENSITIVEThis is a modified FDA-approved test that has been validated and its performance characteristics determined by the reporting laboratory.  This laboratory is certified under the Clinical Laboratory Improvement Amendments CLIA as qualified to perform high complexity clinical laboratory testing.    CIPROFLOXACIN  Value in next row Sensitive      2 SENSITIVEThis is a modified FDA-approved test that has been validated and its performance characteristics determined by the reporting laboratory.  This laboratory is  certified under the Clinical Laboratory Improvement Amendments CLIA as qualified to perform high complexity clinical laboratory testing.    GENTAMICIN Value in next row Sensitive      2 SENSITIVEThis is a modified FDA-approved test that  has been validated and its performance characteristics determined by the reporting laboratory.  This laboratory is certified under the Clinical Laboratory Improvement Amendments CLIA as qualified to perform high complexity clinical laboratory testing.    NITROFURANTOIN Value in next row Intermediate      2 SENSITIVEThis is a modified FDA-approved test that has been validated and its performance characteristics determined by the reporting laboratory.  This laboratory is certified under the Clinical Laboratory Improvement Amendments CLIA as qualified to perform high complexity clinical laboratory testing.    TRIMETH/SULFA Value in next row Sensitive      2 SENSITIVEThis is a modified FDA-approved test that has been validated and its performance characteristics determined by the reporting laboratory.  This laboratory is certified under the Clinical Laboratory Improvement Amendments CLIA as qualified to perform high complexity clinical laboratory testing.    AMPICILLIN/SULBACTAM Value in next row Sensitive      2 SENSITIVEThis is a modified FDA-approved test that has been validated and its performance characteristics determined by the reporting laboratory.  This laboratory is certified under the Clinical Laboratory Improvement Amendments CLIA as qualified to perform high complexity clinical laboratory testing.    PIP/TAZO Value in next row Sensitive ug/mL     <=4 SENSITIVEThis is a modified FDA-approved test that has been validated and its performance characteristics determined by the reporting laboratory.  This laboratory is certified under the Clinical Laboratory Improvement Amendments CLIA as qualified to perform high complexity clinical laboratory testing.    MEROPENEM Value in next row Sensitive      <=4 SENSITIVEThis is a modified FDA-approved test that has been validated and its performance characteristics determined by the reporting laboratory.  This laboratory is certified under the Clinical  Laboratory Improvement Amendments CLIA as qualified to perform high complexity clinical laboratory testing.    * 50,000 COLONIES/mL KLEBSIELLA PNEUMONIAE      Radiology Studies last 3 days: ECHOCARDIOGRAM COMPLETE Result Date: 12/17/2023    ECHOCARDIOGRAM REPORT   Patient Name:   Jerry Mcpherson Date of Exam: 12/17/2023 Medical Rec #:  [id number]           Height:       70.0 in Accession #:    [id number]          Weight:       135.0 lb Date of Birth:  09/09/1940           BSA:          1.766 m Patient Age:    83 years            BP:           122/73 mmHg Patient Gender: M                   HR:           95 bpm. Exam Location:  [facility] Procedure: 2D Echo, Cardiac Doppler and Color Doppler (Both Spectral and Color            Flow Doppler were utilized during procedure). Indications:     Syncope R55  History:         Patient has no prior history of Echocardiogram examinations.  Sonographer:     Thedora Louder RDCS, FASE Referring Phys:  [id number] MOHAMMAD L GARBA Diagnosing Phys: Denyse Bathe  Sonographer Comments: Technically difficult study due to poor echo windows and suboptimal parasternal window. Image acquisition challenging due to respiratory motion. This study was performed with patient sitting up. IMPRESSIONS  1. Left ventricular ejection fraction, by estimation, is 35 to 40%. The left ventricle has moderate to severely decreased function. The left ventricle demonstrates global hypokinesis. The left ventricular internal cavity size was severely dilated. There  is mild concentric left ventricular hypertrophy. Left ventricular diastolic parameters are consistent with Grade III diastolic dysfunction (restrictive).  2. Right ventricular systolic function is moderately reduced. The right ventricular size is moderately enlarged. Mildly increased right ventricular wall thickness.  3. Left atrial size was severely dilated.  4. Right atrial size was severely dilated.  5. The mitral valve is abnormal. Mild to moderate  mitral valve regurgitation.  6. The aortic valve is calcified. Aortic valve regurgitation is trivial. Moderate to severe aortic valve stenosis. Conclusion(s)/Recommendation(s): Findings consistent with severe valvular heart disease. FINDINGS  Left Ventricle: Left ventricular ejection fraction, by estimation, is 35 to 40%. The left ventricle has moderate to severely decreased function. The left ventricle demonstrates global hypokinesis. Strain was performed and the global longitudinal strain is indeterminate. The left ventricular internal cavity size was severely dilated. There is mild concentric left ventricular hypertrophy. Left ventricular diastolic parameters are consistent with Grade III diastolic dysfunction (restrictive). Right Ventricle: The right ventricular size is moderately enlarged. Mildly increased right ventricular wall thickness. Right ventricular systolic function is moderately reduced. Left Atrium: Left atrial size was severely dilated. Right Atrium: Right atrial size was severely dilated. Pericardium: There is no evidence of pericardial effusion. Mitral Valve: The mitral valve is abnormal. Mild to moderate mitral valve regurgitation. Tricuspid Valve: The tricuspid valve is grossly normal. Tricuspid valve regurgitation is trivial. Aortic Valve: Due to cardiomyopathy may have low pressure gradiant and may have severe aortic stenosis. The aortic valve is calcified. Aortic valve regurgitation is trivial. Moderate to severe aortic stenosis is present. Aortic valve mean gradient measures 15.8 mmHg. Aortic valve peak gradient measures 19.8 mmHg. Aortic valve area, by VTI measures 1.39 cm. Pulmonic Valve: The pulmonic valve was grossly normal. Pulmonic valve regurgitation is trivial. Aorta: The aortic root, ascending aorta and aortic arch are all structurally normal, with no evidence of dilitation or obstruction. IAS/Shunts: No atrial level shunt detected by color flow Doppler. Additional Comments: 3D was  performed not requiring image post processing on an independent workstation and was indeterminate.  LEFT VENTRICLE PLAX 2D LVIDd:         5.50 cm      Diastology LVIDs:         4.50 cm      LV e' medial:    4.57 cm/s LV PW:         1.30 cm      LV E/e' medial:  19.4 LV IVS:        1.30 cm      LV e' lateral:   3.37 cm/s LVOT diam:     2.20 cm      LV E/e' lateral: 26.3 LV SV:         72 LV SV Index:   41 LVOT Area:     3.80 cm  LV Volumes (MOD) LV vol d, MOD A2C: 135.0 ml LV vol d, MOD A4C: 146.0 ml LV vol s, MOD A2C: 65.1 ml LV vol s, MOD A4C: 88.3 ml LV SV MOD A2C:  69.9 ml LV SV MOD A4C:     146.0 ml LV SV MOD BP:      65.8 ml RIGHT VENTRICLE RV Basal diam:  3.50 cm RV S prime:     22.00 cm/s TAPSE (M-mode): 3.5 cm LEFT ATRIUM             Index        RIGHT ATRIUM           Index LA diam:        3.90 cm 2.21 cm/m   RA Area:     14.80 cm LA Vol (A2C):   60.3 ml 34.14 ml/m  RA Volume:   36.10 ml  20.44 ml/m LA Vol (A4C):   39.2 ml 22.19 ml/m LA Biplane Vol: 49.4 ml 27.97 ml/m  AORTIC VALVE                     PULMONIC VALVE AV Area (Vmax):    1.59 cm      PV Vmax:        0.96 m/s AV Area (Vmean):   1.26 cm      PV Peak grad:   3.7 mmHg AV Area (VTI):     1.39 cm      RVOT Peak grad: 3 mmHg AV Vmax:           222.62 cm/s AV Vmean:          184.800 cm/s AV VTI:            0.518 m AV Peak Grad:      19.8 mmHg AV Mean Grad:      15.8 mmHg LVOT Vmax:         93.40 cm/s LVOT Vmean:        61.200 cm/s LVOT VTI:          0.190 m LVOT/AV VTI ratio: 0.37  AORTA Ao Root diam: 3.10 cm Ao Asc diam:  3.30 cm MITRAL VALVE               TRICUSPID VALVE MV Area (PHT): 6.22 cm    TR Peak grad:   10.5 mmHg MV Decel Time: 122 msec    TR Vmax:        162.00 cm/s MV E velocity: 88.60 cm/s                            SHUNTS                            Systemic VTI:  0.19 m                            Systemic Diam: 2.20 cm Denyse Bathe Electronically signed by Denyse Bathe Signature Date/Time: 12/17/2023/1:12:32 PM    Final     MR BRAIN WO CONTRAST Result Date: 12/16/2023 CLINICAL DATA:  Syncope/presyncope, cerebrovascular cause suspected EXAM: MRI HEAD WITHOUT CONTRAST TECHNIQUE: Multiplanar, multiecho pulse sequences of the brain and surrounding structures were obtained without intravenous contrast. COMPARISON:  MRI head 03/24/2018. FINDINGS: Brain: No acute infarction, acute hemorrhage, hydrocephalus, extra-axial collection or mass lesion. Small remote right cerebellar infarct. Small remote left corona radiata lacunar infarct and mild for age chronic microvascular ischemic disease. Focus susceptibility artifact in the left thalamus, compatible with prior hemorrhage. Cerebral atrophy. Vascular: Major arterial flow voids are maintained at  the skull base. Skull and upper cervical spine: Normal marrow signal. Sinuses/Orbits: Severe bilateral maxillary sinus mucosal thickening. No acute orbital findings. IMPRESSION: 1. No evidence of acute intracranial abnormality. 2. Small remote lacunar infarcts and prior left thalamic hemorrhage. 3. Severe bilateral maxillary sinus mucosal thickening. Electronically Signed   By: Gilmore GORMAN Molt M.D.   On: 12/16/2023 20:36       Time spent: 50 min     Laneta Blunt, DO [facility] 12/18/2023, 12:48 PM    Dictation software may have been used to generate the above note. Typos may occur and escape review in typed/dictated notes. Please contact Dr Blunt directly for clarity if needed.  Staff may message me via secure chat in Epic  but this may not receive an immediate response,  please page me for urgent matters!  If 7PM-7AM, please contact night coverage [email or web address]

## 2023-12-18 NOTE — Consult Note (Addendum)
 Jerry Mcpherson is a 83 y.o. adult  [id number]  Primary Cardiologist: Dr. Larey Reason for Consultation: CHF/Syncope  HPI: This is a 83 year old white male patient presented to the hospital with multiple episodes of syncope.  Patient been feeling dizzy and lightheaded and passed out few times before coming to the emergency room.  Patient at this time is complaining of shortness of breath and weakness but no chest pain.   Review of Systems: Appears to be alert and oriented although hard of hearing and no further episodes of syncope since hospitalization.   Past Medical History:  Diagnosis Date   Acute deep vein thrombosis (DVT) of right femoral vein (HCC) 10/28/2020   US  10/24/2020 [facility] Acute right lower extremity DVT extending from the common femoral vein to  the popliteal vein.     Bladder cancer (HCC)    Genital herpes    Hyperglycemia    Hypertension    Squamous cell carcinoma     Medications Prior to Admission  Medication Sig Dispense Refill   clopidogrel  (PLAVIX ) 75 MG tablet Take 75 mg by mouth daily.     diclofenac  Sodium (VOLTAREN ) 1 % GEL Apply 4 g topically 4 (four) times daily as needed. 100 g 5   ELIQUIS  5 MG TABS tablet TAKE 1 TABLET BY MOUTH TWICE A DAY 180 tablet 3   ferrous sulfate  325 (65 FE) MG tablet Take 325 mg by mouth daily.     furosemide  (LASIX ) 20 MG tablet Take 2 tablets by mouth daily.     hydrochlorothiazide  (HYDRODIURIL ) 25 MG tablet TAKE 1 TABLET BY MOUTH EVERY DAY 90 tablet 1   hydroxyurea  (HYDREA ) 500 MG capsule Take 500 mg by mouth as directed. 2 tablets 3 days a week, and 1 tablet all other days     metoprolol  succinate (TOPROL -XL) 50 MG 24 hr tablet TAKE 1 TABLET BY MOUTH EVERY DAY (Patient taking differently: Take 50 mg by mouth 2 (two) times daily.) 90 tablet 3   Multiple Vitamins-Minerals (MULTIVITAMIN ADULT PO) Take 1 tablet by mouth daily.     oxyCODONE -acetaminophen  (PERCOCET) 10-325 MG tablet Take 1 tablet by mouth every 6 (six)  hours as needed. for pain 120 tablet 0   valACYclovir  (VALTREX ) 1000 MG tablet TAKE 1 TABLET BY MOUTH TWICE A DAY AS NEEDED 15 tablet 4   docusate sodium  (COLACE) 100 MG capsule Take 1 capsule by mouth daily as needed.     levothyroxine  (SYNTHROID ) 75 MCG tablet TAKE 1 TABLET BY MOUTH EVERY DAY 90 tablet 1      apixaban   2.5 mg Oral BID   clopidogrel   75 mg Oral Daily   feeding supplement  237 mL Oral BID BM   ferrous sulfate   325 mg Oral Daily   hydroxyurea   500 mg Oral Daily   levothyroxine   75 mcg Oral Q0600   multivitamin with minerals  1 tablet Oral Daily   sodium chloride  flush  3 mL Intravenous Q12H    Infusions:  cefTRIAXone  (ROCEPHIN )  IV Stopped (12/17/23 1845)    Allergies  Allergen Reactions   Losartan Potassium     angioedema Other reaction(s): Angioedema   Shellfish Allergy Swelling and Rash    Rash, itching, swelling   Currently eats SHELLFISH without symptoms   Shellfish-Derived Products Dermatitis and Swelling    Rash, itching, swelling    Rash, itching, swelling   Currently eats SHELLFISH without symptoms   Doxycycline  Other (See Comments)    Blisters on hand  Ultram  [Tramadol Hcl] Nausea And Vomiting   Celecoxib Itching, Swelling, Rash and Other (See Comments)    Cannot take when eating shellfish   Codeine Palpitations    Heart racing   Cyclobenzaprine Other (See Comments)    Musculoskeletal therapy agents cause Excessive sedation Musculoskeletal therapy agents cause excessive sedation   Tramadol Hcl Nausea And Vomiting    Social History   Socioeconomic History   Marital status: Divorced    Spouse name: Not on file   Number of children: 1   Years of education: Not on file   Highest education level: Some college, no degree  Occupational History   Occupation: Retired   Occupation: Sales executive part time  Tobacco Use   Smoking status: Former    Current packs/day: 0.00    Average packs/day: 3.0 packs/day for 15.0 years (45.0 ttl  pk-yrs)    Types: Cigarettes    Start date: 04/12/1953    Quit date: 04/12/1968    Years since quitting: 55.7   Smokeless tobacco: Never  Vaping Use   Vaping status: Never Used  Substance and Sexual Activity   Alcohol use: No    Alcohol/week: 0.0 standard drinks of alcohol   Drug use: No   Sexual activity: Not on file  Other Topics Concern   Not on file  Social History Narrative   Not on file   Social Drivers of Health   Financial Resource Strain: Low Risk  (09/16/2023)   Received from [facility]   Overall Financial Resource Strain (CARDIA)    Difficulty of Paying Living Expenses: Not hard at all  Recent Concern: Financial Resource Strain - Medium Risk (08/10/2023)   Overall Financial Resource Strain (CARDIA)    Difficulty of Paying Living Expenses: Somewhat hard  Food Insecurity: No Food Insecurity (12/17/2023)   Hunger Vital Sign    Worried About Running Out of Food in the Last Year: Never true    Ran Out of Food in the Last Year: Never true  Transportation Needs: No Transportation Needs (12/17/2023)   PRAPARE - Administrator, Civil Service (Medical): No    Lack of Transportation (Non-Medical): No  Physical Activity: Inactive (08/10/2023)   Exercise Vital Sign    Days of Exercise per Week: 0 days    Minutes of Exercise per Session: 0 min  Stress: No Stress Concern Present (08/10/2023)   [employer] of Occupational Health - Occupational Stress Questionnaire    Feeling of Stress : Only a little  Social Connections: Socially Isolated (12/17/2023)   Social Connection and Isolation Panel    Frequency of Communication with Friends and Family: More than three times a week    Frequency of Social Gatherings with Friends and Family: More than three times a week    Attends Religious Services: Never    Database administrator or Organizations: No    Attends Banker Meetings: Never    Marital Status: Divorced  Catering manager Violence: Not At  Risk (12/17/2023)   Humiliation, Afraid, Rape, and Kick questionnaire    Fear of Current or Ex-Partner: No    Emotionally Abused: No    Physically Abused: No    Sexually Abused: No    Family History  Problem Relation Age of Onset   Hypertension Mother    Heart attack Mother    Stroke Father    Cancer Father    COPD Sister    Congestive Heart Failure Brother     PHYSICAL  EXAM: Vitals:   12/18/23 0436 12/18/23 0905  BP: 113/71 106/76  Pulse: (!) 53 (!) 53  Resp:  18  Temp: 98.4 F (36.9 C) 97.6 F (36.4 C)  SpO2: 96% 98%     Intake/Output Summary (Last 24 hours) at 12/18/2023 1016 Last data filed at 12/18/2023 0921 Gross per 24 hour  Intake 337 ml  Output 1500 ml  Net -1163 ml    General:  Well appearing. No respiratory difficulty HEENT: normal Neck: supple. no JVD. Carotids 2+ bilat; no bruits. No lymphadenopathy or thryomegaly appreciated. Cor: PMI nondisplaced. Regular rate & rhythm. No rubs, gallops or murmurs. Lungs: clear Abdomen: soft, nontender, nondistended. No hepatosplenomegaly. No bruits or masses. Good bowel sounds. Extremities: no cyanosis, clubbing, rash, edema Neuro: alert & oriented x 3, cranial nerves grossly intact. moves all 4 extremities w/o difficulty. Affect pleasant.  ECG: Multifocal atrial tachycardia with ventricular rate 105 bpm with nonspecific ST-T changes  Results for orders placed or performed during the hospital encounter of 12/16/23 (from the past 24 hours)  Glucose, capillary     Status: Abnormal   Collection Time: 12/18/23  4:32 AM  Result Value Ref Range   Glucose-Capillary 117 (H) 70 - 99 mg/dL  Basic metabolic panel with GFR     Status: Abnormal   Collection Time: 12/18/23  4:43 AM  Result Value Ref Range   Sodium 138 135 - 145 mmol/L   Potassium 4.0 3.5 - 5.1 mmol/L   Chloride 112 (H) 98 - 111 mmol/L   CO2 16 (L) 22 - 32 mmol/L   Glucose, Bld 100 (H) 70 - 99 mg/dL   BUN 44 (H) 8 - 23 mg/dL   Creatinine, Ser 8.13 (H) 0.61 -  1.24 mg/dL   Calcium  8.3 (L) 8.9 - 10.3 mg/dL   GFR, Estimated 35 (L) >60 mL/min   Anion gap 10 5 - 15  CBC with Differential/Platelet     Status: Abnormal   Collection Time: 12/18/23  4:43 AM  Result Value Ref Range   WBC 19.9 (H) 4.0 - 10.5 K/uL   RBC 5.05 4.22 - 5.81 MIL/uL   Hemoglobin 13.9 13.0 - 17.0 g/dL   HCT 56.1 60.9 - 47.9 %   MCV 86.7 80.0 - 100.0 fL   MCH 27.5 26.0 - 34.0 pg   MCHC 31.7 30.0 - 36.0 g/dL   RDW 72.9 (H) 88.4 - 84.4 %   Platelets 541 (H) 150 - 400 K/uL   nRBC 0.0 0.0 - 0.2 %   Neutrophils Relative % 80 %   Neutro Abs 15.9 (H) 1.7 - 7.7 K/uL   Lymphocytes Relative 8 %   Lymphs Abs 1.6 0.7 - 4.0 K/uL   Monocytes Relative 6 %   Monocytes Absolute 1.3 (H) 0.1 - 1.0 K/uL   Eosinophils Relative 3 %   Eosinophils Absolute 0.6 (H) 0.0 - 0.5 K/uL   Basophils Relative 2 %   Basophils Absolute 0.4 (H) 0.0 - 0.1 K/uL   WBC Morphology See Note    RBC Morphology See Note    Smear Review Normal platelet morphology    Immature Granulocytes 1 %   Abs Immature Granulocytes 0.28 (H) 0.00 - 0.07 K/uL   ECHOCARDIOGRAM COMPLETE Result Date: 12/17/2023    ECHOCARDIOGRAM REPORT   Patient Name:   FINIAN HELVEY Hewson Date of Exam: 12/17/2023 Medical Rec #:  [id number]           Height:       70.0 in Accession #:  [id number]          Weight:       135.0 lb Date of Birth:  Mar 09, 1941           BSA:          1.766 m Patient Age:    83 years            BP:           122/73 mmHg Patient Gender: M                   HR:           95 bpm. Exam Location:  [facility] Procedure: 2D Echo, Cardiac Doppler and Color Doppler (Both Spectral and Color            Flow Doppler were utilized during procedure). Indications:     Syncope R55  History:         Patient has no prior history of Echocardiogram examinations.  Sonographer:     Thedora Louder RDCS, FASE Referring Phys:  [id number] EMERY LITTIE FUSS Diagnosing Phys: Denyse Bathe  Sonographer Comments: Technically difficult study due to poor echo windows and  suboptimal parasternal window. Image acquisition challenging due to respiratory motion. This study was performed with patient sitting up. IMPRESSIONS  1. Left ventricular ejection fraction, by estimation, is 35 to 40%. The left ventricle has moderate to severely decreased function. The left ventricle demonstrates global hypokinesis. The left ventricular internal cavity size was severely dilated. There  is mild concentric left ventricular hypertrophy. Left ventricular diastolic parameters are consistent with Grade III diastolic dysfunction (restrictive).  2. Right ventricular systolic function is moderately reduced. The right ventricular size is moderately enlarged. Mildly increased right ventricular wall thickness.  3. Left atrial size was severely dilated.  4. Right atrial size was severely dilated.  5. The mitral valve is abnormal. Mild to moderate mitral valve regurgitation.  6. The aortic valve is calcified. Aortic valve regurgitation is trivial. Moderate to severe aortic valve stenosis. Conclusion(s)/Recommendation(s): Findings consistent with severe valvular heart disease. FINDINGS  Left Ventricle: Left ventricular ejection fraction, by estimation, is 35 to 40%. The left ventricle has moderate to severely decreased function. The left ventricle demonstrates global hypokinesis. Strain was performed and the global longitudinal strain is indeterminate. The left ventricular internal cavity size was severely dilated. There is mild concentric left ventricular hypertrophy. Left ventricular diastolic parameters are consistent with Grade III diastolic dysfunction (restrictive). Right Ventricle: The right ventricular size is moderately enlarged. Mildly increased right ventricular wall thickness. Right ventricular systolic function is moderately reduced. Left Atrium: Left atrial size was severely dilated. Right Atrium: Right atrial size was severely dilated. Pericardium: There is no evidence of pericardial effusion.  Mitral Valve: The mitral valve is abnormal. Mild to moderate mitral valve regurgitation. Tricuspid Valve: The tricuspid valve is grossly normal. Tricuspid valve regurgitation is trivial. Aortic Valve: Due to cardiomyopathy may have low pressure gradiant and may have severe aortic stenosis. The aortic valve is calcified. Aortic valve regurgitation is trivial. Moderate to severe aortic stenosis is present. Aortic valve mean gradient measures 15.8 mmHg. Aortic valve peak gradient measures 19.8 mmHg. Aortic valve area, by VTI measures 1.39 cm. Pulmonic Valve: The pulmonic valve was grossly normal. Pulmonic valve regurgitation is trivial. Aorta: The aortic root, ascending aorta and aortic arch are all structurally normal, with no evidence of dilitation or obstruction. IAS/Shunts: No atrial level shunt detected by color flow Doppler. Additional Comments: 3D was performed not requiring  image post processing on an independent workstation and was indeterminate.  LEFT VENTRICLE PLAX 2D LVIDd:         5.50 cm      Diastology LVIDs:         4.50 cm      LV e' medial:    4.57 cm/s LV PW:         1.30 cm      LV E/e' medial:  19.4 LV IVS:        1.30 cm      LV e' lateral:   3.37 cm/s LVOT diam:     2.20 cm      LV E/e' lateral: 26.3 LV SV:         72 LV SV Index:   41 LVOT Area:     3.80 cm  LV Volumes (MOD) LV vol d, MOD A2C: 135.0 ml LV vol d, MOD A4C: 146.0 ml LV vol s, MOD A2C: 65.1 ml LV vol s, MOD A4C: 88.3 ml LV SV MOD A2C:     69.9 ml LV SV MOD A4C:     146.0 ml LV SV MOD BP:      65.8 ml RIGHT VENTRICLE RV Basal diam:  3.50 cm RV S prime:     22.00 cm/s TAPSE (M-mode): 3.5 cm LEFT ATRIUM             Index        RIGHT ATRIUM           Index LA diam:        3.90 cm 2.21 cm/m   RA Area:     14.80 cm LA Vol (A2C):   60.3 ml 34.14 ml/m  RA Volume:   36.10 ml  20.44 ml/m LA Vol (A4C):   39.2 ml 22.19 ml/m LA Biplane Vol: 49.4 ml 27.97 ml/m  AORTIC VALVE                     PULMONIC VALVE AV Area (Vmax):    1.59 cm       PV Vmax:        0.96 m/s AV Area (Vmean):   1.26 cm      PV Peak grad:   3.7 mmHg AV Area (VTI):     1.39 cm      RVOT Peak grad: 3 mmHg AV Vmax:           222.62 cm/s AV Vmean:          184.800 cm/s AV VTI:            0.518 m AV Peak Grad:      19.8 mmHg AV Mean Grad:      15.8 mmHg LVOT Vmax:         93.40 cm/s LVOT Vmean:        61.200 cm/s LVOT VTI:          0.190 m LVOT/AV VTI ratio: 0.37  AORTA Ao Root diam: 3.10 cm Ao Asc diam:  3.30 cm MITRAL VALVE               TRICUSPID VALVE MV Area (PHT): 6.22 cm    TR Peak grad:   10.5 mmHg MV Decel Time: 122 msec    TR Vmax:        162.00 cm/s MV E velocity: 88.60 cm/s  SHUNTS                            Systemic VTI:  0.19 m                            Systemic Diam: 2.20 cm Denyse Bathe Electronically signed by Denyse Bathe Signature Date/Time: 12/17/2023/1:12:32 PM    Final    MR BRAIN WO CONTRAST Result Date: 12/16/2023 CLINICAL DATA:  Syncope/presyncope, cerebrovascular cause suspected EXAM: MRI HEAD WITHOUT CONTRAST TECHNIQUE: Multiplanar, multiecho pulse sequences of the brain and surrounding structures were obtained without intravenous contrast. COMPARISON:  MRI head 03/24/2018. FINDINGS: Brain: No acute infarction, acute hemorrhage, hydrocephalus, extra-axial collection or mass lesion. Small remote right cerebellar infarct. Small remote left corona radiata lacunar infarct and mild for age chronic microvascular ischemic disease. Focus susceptibility artifact in the left thalamus, compatible with prior hemorrhage. Cerebral atrophy. Vascular: Major arterial flow voids are maintained at the skull base. Skull and upper cervical spine: Normal marrow signal. Sinuses/Orbits: Severe bilateral maxillary sinus mucosal thickening. No acute orbital findings. IMPRESSION: 1. No evidence of acute intracranial abnormality. 2. Small remote lacunar infarcts and prior left thalamic hemorrhage. 3. Severe bilateral maxillary sinus mucosal thickening.  Electronically Signed   By: Gilmore GORMAN Molt M.D.   On: 12/16/2023 20:36     ASSESSMENT AND PLAN: #1 recurrent syncope, echocardiogram revealed moderate to severe aortic stenosis but has LVEF of 35 to 40% with global hypokinesis.  Patient may have severe aortic stenosis associated with low flow state,due to cardiomyopathy.  Syncopal episodes may be related to that and might benefit from TAVR.  However patient has multiple comorbidities including CRI, with creatinine range 1.8-2.0.  Also right now white count is elevated and may have a UTI.  Patient has a history of having CVA in the past but MRI did not have any acute changes.  Advise getting neurology consultation. #2 congestive heart failure, patient appears to be euvolemic.  LVEF is moderately depressed with global hypokinesis and grade 3 diastolic dysfunction.  Advise adding Farxiga  10 mg and enalapril  2.5 mg once a day.  Creatinine is ranging between 1.8 and 2.0 but is unchanged from prior levels. #3 elevated white count, white count was 19 and is being evaluated by hospitalist as UTI is being considered. Thanks.  Shakeila Pfarr Bathe

## 2023-12-18 NOTE — Progress Notes (Signed)
 Pt verbalized that he changes his urostomy ileal conduit bag every 3 days at home, the last time he changed the bag was on Thursday, 12/15/23; the pt does not like the brand of urostomy bags that [facility] carries and declined to change the bag at this time

## 2023-12-19 ENCOUNTER — Ambulatory Visit: Admitting: Physician Assistant

## 2023-12-19 DIAGNOSIS — R55 Syncope and collapse: Secondary | ICD-10-CM | POA: Diagnosis not present

## 2023-12-19 LAB — GLUCOSE, CAPILLARY: Glucose-Capillary: 101 mg/dL — ABNORMAL HIGH (ref 70–99)

## 2023-12-19 MED ORDER — AMIODARONE HCL 200 MG PO TABS: 200.0000 mg | ORAL_TABLET | Freq: Every day | ORAL | Status: DC

## 2023-12-19 MED ORDER — AMIODARONE HCL 200 MG PO TABS
400.0000 mg | ORAL_TABLET | Freq: Two times a day (BID) | ORAL | Status: DC
Start: 2023-12-19 — End: 2023-12-29
  Administered 2023-12-19 – 2023-12-20 (×3): 400 mg via ORAL
  Filled 2023-12-19 (×3): qty 2

## 2023-12-19 MED ORDER — AMIODARONE HCL 200 MG PO TABS: 400.0000 mg | ORAL_TABLET | Freq: Two times a day (BID) | ORAL | Status: DC

## 2023-12-19 MED ORDER — METOPROLOL SUCCINATE ER 25 MG PO TB24: 12.5000 mg | ORAL_TABLET | Freq: Every day | ORAL | Status: DC

## 2023-12-19 NOTE — Progress Notes (Signed)
 PROGRESS NOTE    Milon Dethloff Cuadros   FMW:[id number] DOB: [birth year]  DOA: 12/16/2023 Date of Service: 12/19/23 which is hospital day 3  PCP: [name], MD    Hospital course / significant events:   HPI: Kenaz Olafson is a 83 y.o. male with medical history significant of essential hypertension, recurrent vertigo, polycythemia vera, hypothyroidism, history of DVTs, atrial fibrillation and flutter, history of bladder cancer peripheral arterial disease, UTI, who presents to the ER with multiple syncopal episodes. Of note, has been seen 12/09/23 by his PCP w/ concern for SOB and weight loss, pt complains of worsening fatigue for some time now. Noted high BMP, leukocytosis  09/05: admitted for syncope likely d/t UTI, continue on ceftriaxone . MRI of the brain showing no CVA. Echo pending  09/06: echo pending. Did well w/ PT/OT ok for outpatinet f/u. Continue abx and await cultures  09/07: Echo LVEF 35-40, global hypokinesis and severely dilated, grade III diastolic df --> cardiology consult new dx CHF. UCx (+)klebsiella and citrobacter (S)Cipro   09/08:      Consultants:  Cardiology   Procedures/Surgeries: none      ASSESSMENT & PLAN:   recurrent syncopal episodes likely hypotensive d/t on BP meds and w/ UTI, malnutrition, PVD/PAD, HFrEF, AS, PAfib/AF Treat UTI as below Treat cardiac disease as below  PT/OT - no concerns  Hold antihypertensives    Complicated UTI Hx cystectomy d/t bladder mass UCx (+) Klebsiella 50K/mL CFU UCx (+) Citrobacter >100K/mL CFU Both sensitive to ceftriaxone  which he's been getting Both sensitive to po cipro , tmp/smx Ceftriaxone  --> cipro  for complicated UTI, pharmacy to confirm renal dose   Paroxysmal atrial fibrillation/flutter Hx CVA Per tele having multiple episodes of AF/AFL RVR.  goal K >4, Mag >2 Eliquis  Started PO amio 400 mg BID for 10 days, then 200 mg daily for rhythm control. resume home metoprolol  succinate 12.5 mg  when BP/HR allow. Cardiology to follow  May need cardiac monitoring on discharge?   New diagnosis HFrEF w/ grade III diastolic dysfunction Mod-Severe aortic stenosis  Mild-Mod mitral regurg  LVEF 35-40, LV global hypokinesis and severely dilated, grade III diastolic df  Cardiology to follow   Have held beta blocker / antihypertensives d/t low BP  Cardiology initially added farxiga  10 mg and enalapril  2.5 mg --> dc today d/t concern for angioedema w/ losartan, aldo UTI w/ farxiga  not advised  Resume home lasix   resume home metoprolol  succinate 12.5 mg when BP/HR allow. initiate spironolactone when renal function stabilizes.  outpatient ischemic evaluation.  Moderate to severe aortic stenosis Echo from 05/2023 reveals moderate paradoxical low-flow aortic stenosis.  Echo this admission revealed moderate to severe aortic stenosis. outpatient cardiology follow-up to discussed possible benefit from TAVR.  Peripheral arterial disease Associated ulcer LLE Plavix  + Eliquis   Weight loss, ongoing/chronic Fatigue, ongoing/chronic Certainly UTI and CHF can contribute to this  Consider CT c/a/p - Ideally would get CT w/ contrast but may need to consider MRI / outpatient f/u given renal function  CKD3b Cr appears baseline Monitor BMP Holding diuretics pending cardiology recs   Polycythemia vera followed by hematology ourpatient Appears to be stable Continue home hydroxyurea     Hypothyroidism Continue with levothyroxine    Essential hypertension Hold antihypertensives beta blocker and thiazide d/t low BP / renal function Other cardiac meds as above re: CHF dx  History of bladder cancer:  Cystectomy, has abd pouch drains urine Change out pouch  Continue to monitor   A-flutter Rate is controlled.   Eliquis   Hold beta blocker w/ low BP Telemetry    UTI:  IV Rocephin  Await cultures    History of DVT Eliquis       Borderline underweight based on BMI: Body mass index is 19.37  kg/m.SABRA Significantly low or high BMI is associated with higher medical risk.  Pt appears frail and notes poor po intake, his albumin is low, (+)malnourished  Underweight - under 18  overweight - 25 to 29 obese - 30 or more Class 1 obesity: BMI of 30.0 to 34 Class 2 obesity: BMI of 35.0 to 39 Class 3 obesity: BMI of 40.0 to 49 Super Morbid Obesity: BMI 50-59 Super-super Morbid Obesity: BMI 60+ Healthy nutrition and physical activity advised as adjunct to other disease management and risk reduction treatments    DVT prophylaxis: eliquis  IV fluids: no continuous IV fluids  Nutrition: regular diet  Central lines / other devices: has urine pouch s/p bladder removal   Code Status: FULL CODE ACP documentation reviewed:  none on file in [facility]  TOC needs: none Medical barriers to dispo: monitoring after starting amio today, possible for DC 1-2 days pending cardiology clearance              Subjective / Brief ROS:  Patient reports he is feeling better today Has had a few spells of lightheadedness (per RN assoc w/ tachycardia on tele) Denies CP/SOB.  Pain controlled.  Denies new weakness.  Tolerating diet.  Reports no concerns w/ urination/defecation.   Family Communication: none at this time - pt declined call to family      Objective Findings:  Vitals:   12/19/23 0040 12/19/23 0510 12/19/23 0858 12/19/23 1131  BP: 108/74 104/69 104/78 94/72  Pulse: 74 (!) 55 70 99  Resp: 16  16 16   Temp: 98.1 F (36.7 C) 97.7 F (36.5 C) (!) 97.5 F (36.4 C) 97.6 F (36.4 C)  TempSrc: Oral Oral    SpO2: 97% 97% 98% 99%  Weight:  59.6 kg    Height:        Intake/Output Summary (Last 24 hours) at 12/19/2023 1450 Last data filed at 12/19/2023 0800 Gross per 24 hour  Intake 180 ml  Output 3540 ml  Net -3360 ml   Filed Weights   12/17/23 1314 12/18/23 0500 12/19/23 0510  Weight: 56.6 kg 59.9 kg 59.6 kg    Examination:  Physical Exam Constitutional:      General: He  is not in acute distress.    Appearance: He is not ill-appearing (thin/frail).     Comments: Fair hygiene - clothes are dirty, appears to have been wearing same socks for some time  Cardiovascular:     Rate and Rhythm: Normal rate. Rhythm irregular.  Pulmonary:     Effort: Pulmonary effort is normal.     Breath sounds: Normal breath sounds.  Abdominal:     General: Abdomen is flat.     Palpations: Abdomen is soft.  Musculoskeletal:     Right lower leg: No edema.     Left lower leg: No edema.  Skin:    General: Skin is warm and dry.  Neurological:     Mental Status: He is alert and oriented to person, place, and time.  Psychiatric:        Mood and Affect: Mood normal.        Behavior: Behavior normal.          Scheduled Medications:   amiodarone   400 mg Oral BID   Followed by   [  START ON 12/29/2023] amiodarone   200 mg Oral Daily   apixaban   2.5 mg Oral BID   ciprofloxacin   500 mg Oral Q breakfast   clopidogrel   75 mg Oral Daily   feeding supplement  237 mL Oral BID BM   ferrous sulfate   325 mg Oral Daily   hydroxyurea   500 mg Oral Daily   levothyroxine   75 mcg Oral Q0600   multivitamin with minerals  1 tablet Oral Daily   sodium chloride  flush  3 mL Intravenous Q12H    Continuous Infusions:    PRN Medications:  oxyCODONE  **AND** acetaminophen , diclofenac  Sodium, docusate sodium , ondansetron  **OR** ondansetron  (ZOFRAN ) IV  Antimicrobials from admission:  Anti-infectives (From admission, onward)    Start     Dose/Rate Route Frequency Ordered Stop   12/19/23 0800  ciprofloxacin  (CIPRO ) tablet 500 mg       Note to Pharmacy: Please check me on renal dosing, thanks!   500 mg Oral Daily with breakfast 12/18/23 1243     12/17/23 1800  cefTRIAXone  (ROCEPHIN ) 1 g in sodium chloride  0.9 % 100 mL IVPB        1 g 200 mL/hr over 30 Minutes Intravenous Every 24 hours 12/16/23 1834 12/18/23 1921   12/16/23 1745  cefTRIAXone  (ROCEPHIN ) 1 g in sodium chloride  0.9 % 100 mL  IVPB        1 g 200 mL/hr over 30 Minutes Intravenous  Once 12/16/23 1741 12/16/23 1834           Data Reviewed:  I have personally reviewed the following...  CBC: Recent Labs  Lab 12/16/23 1510 12/17/23 0423 12/18/23 0443  WBC 21.0* 18.2* 19.9*  NEUTROABS  --   --  15.9*  HGB 14.6 13.3 13.9  HCT 46.2 43.2 43.8  MCV 86.4 87.6 86.7  PLT 581* 460* 541*   Basic Metabolic Panel: Recent Labs  Lab 12/16/23 1510 12/17/23 0423 12/18/23 0443  NA 137 137 138  K 3.9 3.9 4.0  CL 111 112* 112*  CO2 16* 20* 16*  GLUCOSE 137* 95 100*  BUN 41* 41* 44*  CREATININE 2.09* 2.09* 1.86*  CALCIUM  8.6* 8.2* 8.3*   GFR: Estimated Creatinine Clearance (by C-G formula based on SCr of 1.86 mg/dL (H)) Male: 79.1 mL/min (A) Male: 24.5 mL/min (A) Liver Function Tests: Recent Labs  Lab 12/16/23 1510 12/17/23 0423  AST 13* 12*  ALT 7 6  ALKPHOS 71 66  BILITOT 1.1 0.8  PROT 6.9 6.4*  ALBUMIN 3.6 3.3*   No results for input(s): LIPASE, AMYLASE in the last 168 hours. No results for input(s): AMMONIA in the last 168 hours. Coagulation Profile: No results for input(s): INR, PROTIME in the last 168 hours. Cardiac Enzymes: No results for input(s): CKTOTAL, CKMB, CKMBINDEX, TROPONINI in the last 168 hours. BNP (last 3 results) No results for input(s): PROBNP in the last 8760 hours. HbA1C: No results for input(s): HGBA1C in the last 72 hours. CBG: Recent Labs  Lab 12/17/23 0421 12/18/23 0432 12/19/23 0507  GLUCAP 91 117* 101*   Lipid Profile: No results for input(s): CHOL, HDL, LDLCALC, TRIG, CHOLHDL, LDLDIRECT in the last 72 hours. Thyroid  Function Tests: Recent Labs    12/16/23 1801  TSH 8.000*   Anemia Panel: No results for input(s): VITAMINB12, FOLATE, FERRITIN, TIBC, IRON, RETICCTPCT in the last 72 hours. Most Recent Urinalysis On File:     Component Value Date/Time   COLORURINE YELLOW (A) 12/16/2023 1559    APPEARANCEUR TURBID (A) 12/16/2023 1559  LABSPEC 1.009 12/16/2023 1559   PHURINE 6.0 12/16/2023 1559   GLUCOSEU NEGATIVE 12/16/2023 1559   HGBUR MODERATE (A) 12/16/2023 1559   BILIRUBINUR NEGATIVE 12/16/2023 1559   BILIRUBINUR negative 01/09/2020 0949   KETONESUR NEGATIVE 12/16/2023 1559   PROTEINUR 100 (A) 12/16/2023 1559   UROBILINOGEN 0.2 01/09/2020 0949   NITRITE POSITIVE (A) 12/16/2023 1559   LEUKOCYTESUR LARGE (A) 12/16/2023 1559   Sepsis Labs: @LABRCNTIP (procalcitonin:4,lacticidven:4) Microbiology: Recent Results (from the past 240 hours)  Urine Culture     Status: Abnormal   Collection Time: 12/16/23  3:59 PM   Specimen: Urine, Suprapubic  Result Value Ref Range Status   Specimen Description   Final    URINE, SUPRAPUBIC Performed at [facility], [address], [place], [place] [address]    Special Requests   Final    NONE Performed at [facility], [address] [place], [place] [address]    Culture (A)  Final    50,000 COLONIES/mL KLEBSIELLA PNEUMONIAE >=100,000 COLONIES/mL CITROBACTER KOSERI    Report Status 12/18/2023 FINAL  Final   Organism ID, Bacteria KLEBSIELLA PNEUMONIAE (A)  Final   Organism ID, Bacteria CITROBACTER KOSERI (A)  Final      Susceptibility   Citrobacter koseri - MIC*    CEFEPIME <=0.12 SENSITIVE Sensitive     ERTAPENEM <=0.12 SENSITIVE Sensitive     CEFTRIAXONE  <=0.25 SENSITIVE Sensitive     CIPROFLOXACIN  <=0.06 SENSITIVE Sensitive     GENTAMICIN <=1 SENSITIVE Sensitive     NITROFURANTOIN 32 SENSITIVE Sensitive     TRIMETH/SULFA <=20 SENSITIVE Sensitive     PIP/TAZO Value in next row Sensitive ug/mL     <=4 SENSITIVEThis is a modified FDA-approved test that has been validated and its performance characteristics determined by the reporting laboratory.  This laboratory is certified under the Clinical Laboratory Improvement Amendments CLIA as qualified to perform high complexity clinical laboratory testing.     MEROPENEM Value in next row Sensitive      <=4 SENSITIVEThis is a modified FDA-approved test that has been validated and its performance characteristics determined by the reporting laboratory.  This laboratory is certified under the Clinical Laboratory Improvement Amendments CLIA as qualified to perform high complexity clinical laboratory testing.    * >=100,000 COLONIES/mL CITROBACTER KOSERI   Klebsiella pneumoniae - MIC*    AMPICILLIN Value in next row Resistant      <=4 SENSITIVEThis is a modified FDA-approved test that has been validated and its performance characteristics determined by the reporting laboratory.  This laboratory is certified under the Clinical Laboratory Improvement Amendments CLIA as qualified to perform high complexity clinical laboratory testing.    CEFAZOLIN  (URINE) Value in next row Sensitive      2 SENSITIVEThis is a modified FDA-approved test that has been validated and its performance characteristics determined by the reporting laboratory.  This laboratory is certified under the Clinical Laboratory Improvement Amendments CLIA as qualified to perform high complexity clinical laboratory testing.    CEFEPIME Value in next row Sensitive      2 SENSITIVEThis is a modified FDA-approved test that has been validated and its performance characteristics determined by the reporting laboratory.  This laboratory is certified under the Clinical Laboratory Improvement Amendments CLIA as qualified to perform high complexity clinical laboratory testing.    ERTAPENEM Value in next row Sensitive      2 SENSITIVEThis is a modified FDA-approved test that has been validated and its performance characteristics determined by the reporting laboratory.  This laboratory  is certified under the Clinical Laboratory Improvement Amendments CLIA as qualified to perform high complexity clinical laboratory testing.    CEFTRIAXONE  Value in next row Sensitive      2 SENSITIVEThis is a modified FDA-approved test  that has been validated and its performance characteristics determined by the reporting laboratory.  This laboratory is certified under the Clinical Laboratory Improvement Amendments CLIA as qualified to perform high complexity clinical laboratory testing.    CIPROFLOXACIN  Value in next row Sensitive      2 SENSITIVEThis is a modified FDA-approved test that has been validated and its performance characteristics determined by the reporting laboratory.  This laboratory is certified under the Clinical Laboratory Improvement Amendments CLIA as qualified to perform high complexity clinical laboratory testing.    GENTAMICIN Value in next row Sensitive      2 SENSITIVEThis is a modified FDA-approved test that has been validated and its performance characteristics determined by the reporting laboratory.  This laboratory is certified under the Clinical Laboratory Improvement Amendments CLIA as qualified to perform high complexity clinical laboratory testing.    NITROFURANTOIN Value in next row Intermediate      2 SENSITIVEThis is a modified FDA-approved test that has been validated and its performance characteristics determined by the reporting laboratory.  This laboratory is certified under the Clinical Laboratory Improvement Amendments CLIA as qualified to perform high complexity clinical laboratory testing.    TRIMETH/SULFA Value in next row Sensitive      2 SENSITIVEThis is a modified FDA-approved test that has been validated and its performance characteristics determined by the reporting laboratory.  This laboratory is certified under the Clinical Laboratory Improvement Amendments CLIA as qualified to perform high complexity clinical laboratory testing.    AMPICILLIN/SULBACTAM Value in next row Sensitive      2 SENSITIVEThis is a modified FDA-approved test that has been validated and its performance characteristics determined by the reporting laboratory.  This laboratory is certified under the Clinical  Laboratory Improvement Amendments CLIA as qualified to perform high complexity clinical laboratory testing.    PIP/TAZO Value in next row Sensitive ug/mL     <=4 SENSITIVEThis is a modified FDA-approved test that has been validated and its performance characteristics determined by the reporting laboratory.  This laboratory is certified under the Clinical Laboratory Improvement Amendments CLIA as qualified to perform high complexity clinical laboratory testing.    MEROPENEM Value in next row Sensitive      <=4 SENSITIVEThis is a modified FDA-approved test that has been validated and its performance characteristics determined by the reporting laboratory.  This laboratory is certified under the Clinical Laboratory Improvement Amendments CLIA as qualified to perform high complexity clinical laboratory testing.    * 50,000 COLONIES/mL KLEBSIELLA PNEUMONIAE      Radiology Studies last 3 days: ECHOCARDIOGRAM COMPLETE Result Date: 12/17/2023    ECHOCARDIOGRAM REPORT   Patient Name:   NORMAN PIACENTINI Epping Date of Exam: 12/17/2023 Medical Rec #:  [id number]           Height:       70.0 in Accession #:    [id number]          Weight:       135.0 lb Date of Birth:  Aug 20, 1940           BSA:          1.766 m Patient Age:    83 years            BP:  122/73 mmHg Patient Gender: M                   HR:           95 bpm. Exam Location:  [facility] Procedure: 2D Echo, Cardiac Doppler and Color Doppler (Both Spectral and Color            Flow Doppler were utilized during procedure). Indications:     Syncope R55  History:         Patient has no prior history of Echocardiogram examinations.  Sonographer:     Thedora Louder RDCS, FASE Referring Phys:  [id number] EMERY LITTIE FUSS Diagnosing Phys: Denyse Bathe  Sonographer Comments: Technically difficult study due to poor echo windows and suboptimal parasternal window. Image acquisition challenging due to respiratory motion. This study was performed with patient sitting up. IMPRESSIONS   1. Left ventricular ejection fraction, by estimation, is 35 to 40%. The left ventricle has moderate to severely decreased function. The left ventricle demonstrates global hypokinesis. The left ventricular internal cavity size was severely dilated. There  is mild concentric left ventricular hypertrophy. Left ventricular diastolic parameters are consistent with Grade III diastolic dysfunction (restrictive).  2. Right ventricular systolic function is moderately reduced. The right ventricular size is moderately enlarged. Mildly increased right ventricular wall thickness.  3. Left atrial size was severely dilated.  4. Right atrial size was severely dilated.  5. The mitral valve is abnormal. Mild to moderate mitral valve regurgitation.  6. The aortic valve is calcified. Aortic valve regurgitation is trivial. Moderate to severe aortic valve stenosis. Conclusion(s)/Recommendation(s): Findings consistent with severe valvular heart disease. FINDINGS  Left Ventricle: Left ventricular ejection fraction, by estimation, is 35 to 40%. The left ventricle has moderate to severely decreased function. The left ventricle demonstrates global hypokinesis. Strain was performed and the global longitudinal strain is indeterminate. The left ventricular internal cavity size was severely dilated. There is mild concentric left ventricular hypertrophy. Left ventricular diastolic parameters are consistent with Grade III diastolic dysfunction (restrictive). Right Ventricle: The right ventricular size is moderately enlarged. Mildly increased right ventricular wall thickness. Right ventricular systolic function is moderately reduced. Left Atrium: Left atrial size was severely dilated. Right Atrium: Right atrial size was severely dilated. Pericardium: There is no evidence of pericardial effusion. Mitral Valve: The mitral valve is abnormal. Mild to moderate mitral valve regurgitation. Tricuspid Valve: The tricuspid valve is grossly normal. Tricuspid  valve regurgitation is trivial. Aortic Valve: Due to cardiomyopathy may have low pressure gradiant and may have severe aortic stenosis. The aortic valve is calcified. Aortic valve regurgitation is trivial. Moderate to severe aortic stenosis is present. Aortic valve mean gradient measures 15.8 mmHg. Aortic valve peak gradient measures 19.8 mmHg. Aortic valve area, by VTI measures 1.39 cm. Pulmonic Valve: The pulmonic valve was grossly normal. Pulmonic valve regurgitation is trivial. Aorta: The aortic root, ascending aorta and aortic arch are all structurally normal, with no evidence of dilitation or obstruction. IAS/Shunts: No atrial level shunt detected by color flow Doppler. Additional Comments: 3D was performed not requiring image post processing on an independent workstation and was indeterminate.  LEFT VENTRICLE PLAX 2D LVIDd:         5.50 cm      Diastology LVIDs:         4.50 cm      LV e' medial:    4.57 cm/s LV PW:         1.30 cm      LV E/e'  medial:  19.4 LV IVS:        1.30 cm      LV e' lateral:   3.37 cm/s LVOT diam:     2.20 cm      LV E/e' lateral: 26.3 LV SV:         72 LV SV Index:   41 LVOT Area:     3.80 cm  LV Volumes (MOD) LV vol d, MOD A2C: 135.0 ml LV vol d, MOD A4C: 146.0 ml LV vol s, MOD A2C: 65.1 ml LV vol s, MOD A4C: 88.3 ml LV SV MOD A2C:     69.9 ml LV SV MOD A4C:     146.0 ml LV SV MOD BP:      65.8 ml RIGHT VENTRICLE RV Basal diam:  3.50 cm RV S prime:     22.00 cm/s TAPSE (M-mode): 3.5 cm LEFT ATRIUM             Index        RIGHT ATRIUM           Index LA diam:        3.90 cm 2.21 cm/m   RA Area:     14.80 cm LA Vol (A2C):   60.3 ml 34.14 ml/m  RA Volume:   36.10 ml  20.44 ml/m LA Vol (A4C):   39.2 ml 22.19 ml/m LA Biplane Vol: 49.4 ml 27.97 ml/m  AORTIC VALVE                     PULMONIC VALVE AV Area (Vmax):    1.59 cm      PV Vmax:        0.96 m/s AV Area (Vmean):   1.26 cm      PV Peak grad:   3.7 mmHg AV Area (VTI):     1.39 cm      RVOT Peak grad: 3 mmHg AV Vmax:            222.62 cm/s AV Vmean:          184.800 cm/s AV VTI:            0.518 m AV Peak Grad:      19.8 mmHg AV Mean Grad:      15.8 mmHg LVOT Vmax:         93.40 cm/s LVOT Vmean:        61.200 cm/s LVOT VTI:          0.190 m LVOT/AV VTI ratio: 0.37  AORTA Ao Root diam: 3.10 cm Ao Asc diam:  3.30 cm MITRAL VALVE               TRICUSPID VALVE MV Area (PHT): 6.22 cm    TR Peak grad:   10.5 mmHg MV Decel Time: 122 msec    TR Vmax:        162.00 cm/s MV E velocity: 88.60 cm/s                            SHUNTS                            Systemic VTI:  0.19 m                            Systemic Diam: 2.20 cm [employer] Electronically signed  by Denyse Bathe Signature Date/Time: 12/17/2023/1:12:32 PM    Final    MR BRAIN WO CONTRAST Result Date: 12/16/2023 CLINICAL DATA:  Syncope/presyncope, cerebrovascular cause suspected EXAM: MRI HEAD WITHOUT CONTRAST TECHNIQUE: Multiplanar, multiecho pulse sequences of the brain and surrounding structures were obtained without intravenous contrast. COMPARISON:  MRI head 03/24/2018. FINDINGS: Brain: No acute infarction, acute hemorrhage, hydrocephalus, extra-axial collection or mass lesion. Small remote right cerebellar infarct. Small remote left corona radiata lacunar infarct and mild for age chronic microvascular ischemic disease. Focus susceptibility artifact in the left thalamus, compatible with prior hemorrhage. Cerebral atrophy. Vascular: Major arterial flow voids are maintained at the skull base. Skull and upper cervical spine: Normal marrow signal. Sinuses/Orbits: Severe bilateral maxillary sinus mucosal thickening. No acute orbital findings. IMPRESSION: 1. No evidence of acute intracranial abnormality. 2. Small remote lacunar infarcts and prior left thalamic hemorrhage. 3. Severe bilateral maxillary sinus mucosal thickening. Electronically Signed   By: Gilmore GORMAN Molt M.D.   On: 12/16/2023 20:36       Time spent: 50 min     Laneta Blunt, DO [facility] 12/19/2023, 2:50 PM    Dictation software may have been used to generate the above note. Typos may occur and escape review in typed/dictated notes. Please contact Dr Blunt directly for clarity if needed.  Staff may message me via secure chat in Epic  but this may not receive an immediate response,  please page me for urgent matters!  If 7PM-7AM, please contact night coverage [email or web address]

## 2023-12-19 NOTE — Plan of Care (Signed)
  Problem: Education: Goal: Knowledge of General Education information will improve Description: Including pain rating scale, medication(s)/side effects and non-pharmacologic comfort measures Outcome: Progressing   Problem: Health Behavior/Discharge Planning: Goal: Ability to manage health-related needs will improve Outcome: Progressing   Problem: Clinical Measurements: Goal: Will remain free from infection Outcome: Progressing   Problem: Nutrition: Goal: Adequate nutrition will be maintained Outcome: Progressing   Problem: Coping: Goal: Level of anxiety will decrease Outcome: Progressing   Problem: Pain Managment: Goal: General experience of comfort will improve and/or be controlled Outcome: Progressing   Problem: Safety: Goal: Ability to remain free from injury will improve Outcome: Progressing

## 2023-12-19 NOTE — Progress Notes (Signed)
 Heart Failure Stewardship Pharmacy Note  PCP: [name], MD PCP-Cardiologist: None  HPI: Jerry Mcpherson is a 83 y.o. adult with essential hypertension, recurrent vertigo, polycythemia vera, hypothyroidism, history of DVTs, atrial fibrillation and flutter, history of bladder cancer peripheral arterial disease, UTI who presented with syncopal episodes. On admission, HS-troponin was 17, and TSH 8. Recent BNP on 12/09/23 1624.8.   Pertinent cardiac history: Normal LVEF on TTE 03/2020, 10/2020, and 05/2023. Normal stress test in 06/2020. TTE this admission noted newly reduced LVEF of 35-40% with G3DD,   Pertinent Lab Values: Creatinine, Ser  Date Value Ref Range Status  12/18/2023 1.86 (H) 0.61 - 1.24 mg/dL Final   BUN  Date Value Ref Range Status  12/18/2023 44 (H) 8 - 23 mg/dL Final  91/70/7974 30 (H) 8 - 27 mg/dL Final   Potassium  Date Value Ref Range Status  12/18/2023 4.0 3.5 - 5.1 mmol/L Final   Sodium  Date Value Ref Range Status  12/18/2023 138 135 - 145 mmol/L Final  12/09/2023 140 134 - 144 mmol/L Final   BNP  Date Value Ref Range Status  12/09/2023 1,624.8 (H) 0.0 - 100.0 pg/mL Final    Comment:    [employer] ADVIA Centaur XP methodology   Magnesium  Date Value Ref Range Status  01/23/2021 2.2 1.6 - 2.3 mg/dL Final   Hemoglobin J8R  Date Value Ref Range Status  08/18/2020 5.3 4.0 - 5.6 % Final   TSH  Date Value Ref Range Status  12/16/2023 8.000 (H) 0.350 - 4.500 uIU/mL Final    Comment:    Performed by a 3rd Generation assay with a functional sensitivity of <=0.01 uIU/mL. Performed at [facility], [address] [place], [place] [address]   07/27/2021 4.980 (H) 0.450 - 4.500 uIU/mL Final    Vital Signs:  Temp:  [97.6 F (36.4 C)-98.2 F (36.8 C)] 97.7 F (36.5 C) (09/08 0510) Pulse Rate:  [53-84] 55 (09/08 0510) Cardiac Rhythm: Normal sinus rhythm (09/07 1900) Resp:  [16-18] 16 (09/08 0040) BP: (101-108)/(67-79) 104/69 (09/08  0510) SpO2:  [93 %-100 %] 97 % (09/08 0510) Weight:  [59.6 kg (131 lb 6.3 oz)] 59.6 kg (131 lb 6.3 oz) (09/08 0510)  Intake/Output Summary (Last 24 hours) at 12/19/2023 0823 Last data filed at 12/19/2023 0435 Gross per 24 hour  Intake 1317 ml  Output 3715 ml  Net -2398 ml    Current Heart Failure Medications:  Loop diuretic: none Beta-Blocker: none ACEI/ARB/ARNI: enalapril  MRA: none SGLT2i: Farxiga  10 mg daily Other:  Prior to admission Heart Failure Medications:  Loop diuretic: furosemide  40 mg daily Beta-Blocker: metoprolol  succinate 25 mg daily ACEI/ARB/ARNI: none MRA: none SGLT2i: none Other: hydrochlorothiazide  25 mg daily  Assessment: 1. Acute systolic heart failure (LVEF 35-40%) with G3DD, due to unknown etiology. NYHA class II symptoms.  With moderate to severe aortic stenosis, G3DD, neuropathy, and history of biceps tendon partial tear, can consider work-up for amyloid outpatient. -Symptoms: Reports feeling well overall. Denies symptoms at this time. Appetite is okay. -Volume: Appears euvolemic. Syncopal on admission, likely from UTI plus hypovolemia. Home furosemide  40 mg daily held. Would continue to hold at this time, may need to be changed to prn.  -Hemodynamics: BP is normal. HR normal.  -BB: Consider adding metoprolol  succinate 12.5 mg daily tomorrow.  -ACEI/ARB/ARNI: Currently receiving enalapril , which is contraindicated with history of angioedema. Stop enalapril . -MRA: Not a candidate for MRA at this time due to renal function.  -SGLT2i: Currently on Farxiga   10 mg daily. Consider holding given current UTI with elevated white count. High risk for recurrence with bladder history.   Plan: 1) Medication changes recommended at this time: -Discussed with cardiology, will stop enalapril  and hold Farxiga .  -Consider adding metoprolol  succinate 12.5 mg daily tomorrow.  2) Patient assistance: -Pending  3) Education: - Patient has been educated on current HF  medications and potential additions to HF medication regimen - Patient verbalizes understanding that over the next few months, these medication doses may change and more medications may be added to optimize HF regimen - Patient has been educated on basic disease state pathophysiology and goals of therapy  Please do not hesitate to reach out with questions or concerns,  Giuseppe Duchemin, PharmD, CPP, BCPS, [facility] Heart Failure Pharmacist  Phone - [phone] 12/19/2023 10:41 AM

## 2023-12-19 NOTE — Care Management Important Message (Signed)
 Important Message  Patient Details  Name: Jerry Mcpherson MRN: [id number] Date of Birth: April 28, 1940   Important Message Given:  Yes - Medicare IM     Yojan Paskett W, CMA 12/19/2023, 12:14 PM

## 2023-12-19 NOTE — Progress Notes (Signed)
 Heart Failure Navigator Progress Note  Assessed for Heart & Vascular TOC clinic readiness.  Patient does not meet criteria due to [facility] follow-up in 1 week.  Navigator will sign off at this time.  Charmaine Pines, RN, BSN [facility] Heart Failure Navigator Secure Chat Only

## 2023-12-19 NOTE — Progress Notes (Signed)
   12/19/23 1200  Mobility  Activity Ambulated with assistance  Level of Assistance Contact guard assist, steadying assist  Assistive Device Front wheel walker  Distance Ambulated (ft) 100 ft  Range of Motion/Exercises Active Assistive  Activity Response Tolerated well  Mobility Referral Yes  Mobility visit 1 Mobility  Mobility Specialist Start Time (ACUTE ONLY) 1148  Mobility Specialist Stop Time (ACUTE ONLY) 1208  Mobility Specialist Time Calculation (min) (ACUTE ONLY) 20 min   Mobility Specialist - Progress Note  Pt was in bed upon arrival. Pt ambulated well with rolling walker. Pt stated he did not feel any signs of vertigo. Pt was positioned in bed with bed alarm and needs in close proximity.  Clem Rodes Mobility Specialist 12/19/23, 12:13 PM

## 2023-12-19 NOTE — Progress Notes (Cosign Needed Addendum)
 [facility] CLINIC CARDIOLOGY PROGRESS NOTE       Patient ID: Jerry Mcpherson MRN: [id number] DOB/AGE: April 25, 1940 83 y.o.  Admit date: 12/16/2023 Referring Physician Dr. Marsa Primary Physician Gasper, Nancyann BRAVO, MD Primary Cardiologist [facility] [place] Ao, NP) Reason for Consultation New CHF/ Syncope  HPI: Jerry Mcpherson is a 83 y.o. adult  with a past medical history of retention, recurrent vertigo, polycythemia vera, history of DVTs, hypothyroidism, paroxysmal atrial fibrillation/flutter, history of bladder cancer, PAD who presented to the ED on 12/16/2023 for multiple episodes of syncope. Patient states they occur while sitting and has tachycardia with lightheadedness prior to syncopal episode. Cardiology was consulted for further evaluation.   Interval History: -Patient seen and examined this AM and laying comfortably in hospital bed, nearly flat. Patient states he feels fine and denies any more recurrence of syncope or SOB. Denies CP.  -Patients BP and HR stable this AM. Per tele SR with PACs, rate 80s. Has episodes of AF/AFL RVR. -Patient remains on room air with stable SpO2.    Review of systems complete and found to be negative unless listed above    Past Medical History:  Diagnosis Date   Acute deep vein thrombosis (DVT) of right femoral vein (HCC) 10/28/2020   US  10/24/2020 [facility] Acute right lower extremity DVT extending from the common femoral vein to  the popliteal vein.     Bladder cancer (HCC)    Genital herpes    Hyperglycemia    Hypertension    Squamous cell carcinoma     Past Surgical History:  Procedure Laterality Date   CATARACT EXTRACTION Left 10/2012   Dr. Lethea; [facility]   CYSTECTOMY W/ URETEROILEAL CONDUIT  07/24/2020   [facility] for Stage IV bladder cancer extending to prostate   EYE SURGERY Left    removed a piece of steel   FEMORAL ARTERY STENT Left 06/16/2022   SFA stenting at [facility]   HERNIA REPAIR  12/08/2006    umbilical hernia, incarderated; Dr. Dessa   PROSTATECTOMY  4/14/2-22   [facility] for Stage IV bladder cancer extending to prostate   ROTATOR CUFF REPAIR     SQUAMOUS CELL CARCINOMA EXCISION  2008   TONSILLECTOMY      Medications Prior to Admission  Medication Sig Dispense Refill Last Dose/Taking   clopidogrel  (PLAVIX ) 75 MG tablet Take 75 mg by mouth daily.   Unknown   diclofenac  Sodium (VOLTAREN ) 1 % GEL Apply 4 g topically 4 (four) times daily as needed. 100 g 5 Unknown   ELIQUIS  5 MG TABS tablet TAKE 1 TABLET BY MOUTH TWICE A DAY 180 tablet 3 Unknown   ferrous sulfate  325 (65 FE) MG tablet Take 325 mg by mouth daily.   Unknown   furosemide  (LASIX ) 20 MG tablet Take 2 tablets by mouth daily.   Unknown   hydrochlorothiazide  (HYDRODIURIL ) 25 MG tablet TAKE 1 TABLET BY MOUTH EVERY DAY 90 tablet 1 Unknown   hydroxyurea  (HYDREA ) 500 MG capsule Take 500 mg by mouth as directed. 2 tablets 3 days a week, and 1 tablet all other days   Unknown   metoprolol  succinate (TOPROL -XL) 50 MG 24 hr tablet TAKE 1 TABLET BY MOUTH EVERY DAY (Patient taking differently: Take 50 mg by mouth 2 (two) times daily.) 90 tablet 3 Unknown   Multiple Vitamins-Minerals (MULTIVITAMIN ADULT PO) Take 1 tablet by mouth daily.   Unknown   oxyCODONE -acetaminophen  (PERCOCET) 10-325 MG tablet Take 1 tablet by mouth every 6 (six) hours as needed.  for pain 120 tablet 0 Unknown   valACYclovir  (VALTREX ) 1000 MG tablet TAKE 1 TABLET BY MOUTH TWICE A DAY AS NEEDED 15 tablet 4 Unknown   docusate sodium  (COLACE) 100 MG capsule Take 1 capsule by mouth daily as needed.      levothyroxine  (SYNTHROID ) 75 MCG tablet TAKE 1 TABLET BY MOUTH EVERY DAY 90 tablet 1    Social History   Socioeconomic History   Marital status: Divorced    Spouse name: Not on file   Number of children: 1   Years of education: Not on file   Highest education level: Some college, no degree  Occupational History   Occupation: Retired   Occupation: Sales executive  part time  Tobacco Use   Smoking status: Former    Current packs/day: 0.00    Average packs/day: 3.0 packs/day for 15.0 years (45.0 ttl pk-yrs)    Types: Cigarettes    Start date: 04/12/1953    Quit date: 04/12/1968    Years since quitting: 55.7   Smokeless tobacco: Never  Vaping Use   Vaping status: Never Used  Substance and Sexual Activity   Alcohol use: No    Alcohol/week: 0.0 standard drinks of alcohol   Drug use: No   Sexual activity: Not on file  Other Topics Concern   Not on file  Social History Narrative   Not on file   Social Drivers of Health   Financial Resource Strain: Low Risk  (09/16/2023)   Received from [facility]   Overall Financial Resource Strain (CARDIA)    Difficulty of Paying Living Expenses: Not hard at all  Recent Concern: Financial Resource Strain - Medium Risk (08/10/2023)   Overall Financial Resource Strain (CARDIA)    Difficulty of Paying Living Expenses: Somewhat hard  Food Insecurity: No Food Insecurity (12/17/2023)   Hunger Vital Sign    Worried About Running Out of Food in the Last Year: Never true    Ran Out of Food in the Last Year: Never true  Transportation Needs: No Transportation Needs (12/17/2023)   PRAPARE - Administrator, Civil Service (Medical): No    Lack of Transportation (Non-Medical): No  Physical Activity: Inactive (08/10/2023)   Exercise Vital Sign    Days of Exercise per Week: 0 days    Minutes of Exercise per Session: 0 min  Stress: No Stress Concern Present (08/10/2023)   [employer] of Occupational Health - Occupational Stress Questionnaire    Feeling of Stress : Only a little  Social Connections: Socially Isolated (12/17/2023)   Social Connection and Isolation Panel    Frequency of Communication with Friends and Family: More than three times a week    Frequency of Social Gatherings with Friends and Family: More than three times a week    Attends Religious Services: Never    Doctor, general practice or Organizations: No    Attends Banker Meetings: Never    Marital Status: Divorced  Catering manager Violence: Not At Risk (12/17/2023)   Humiliation, Afraid, Rape, and Kick questionnaire    Fear of Current or Ex-Partner: No    Emotionally Abused: No    Physically Abused: No    Sexually Abused: No    Family History  Problem Relation Age of Onset   Hypertension Mother    Heart attack Mother    Stroke Father    Cancer Father    COPD Sister    Congestive Heart Failure Brother  Vitals:   12/18/23 1545 12/18/23 2018 12/19/23 0040 12/19/23 0510  BP: 101/79 107/68 108/74 104/69  Pulse: 68 80 74 (!) 55  Resp: 17 16 16    Temp: 97.8 F (36.6 C) 98.2 F (36.8 C) 98.1 F (36.7 C) 97.7 F (36.5 C)  TempSrc: Oral Oral Oral Oral  SpO2: 100% 93% 97% 97%  Weight:    59.6 kg  Height:        PHYSICAL EXAM General: Well appearing elderly male, well nourished, in no acute distress. HEENT: Normocephalic and atraumatic. Neck: No JVD.   Lungs: Normal respiratory effort on room air. Clear bilaterally to auscultation. No wheezes, crackles, rhonchi.  Heart: HRRR. Normal S1 and S2 without gallops or murmurs.  Abdomen: Non-distended appearing.  Msk: Normal strength and tone for age. Extremities: Warm and well perfused. No clubbing, cyanosis, edema.  Neuro: Alert and oriented X 3. Psych: Answers questions appropriately.   Labs: Basic Metabolic Panel: Recent Labs    12/17/23 0423 12/18/23 0443  NA 137 138  K 3.9 4.0  CL 112* 112*  CO2 20* 16*  GLUCOSE 95 100*  BUN 41* 44*  CREATININE 2.09* 1.86*  CALCIUM  8.2* 8.3*   Liver Function Tests: Recent Labs    12/16/23 1510 12/17/23 0423  AST 13* 12*  ALT 7 6  ALKPHOS 71 66  BILITOT 1.1 0.8  PROT 6.9 6.4*  ALBUMIN 3.6 3.3*   No results for input(s): LIPASE, AMYLASE in the last 72 hours. CBC: Recent Labs    12/17/23 0423 12/18/23 0443  WBC 18.2* 19.9*  NEUTROABS  --  15.9*  HGB 13.3 13.9  HCT 43.2  43.8  MCV 87.6 86.7  PLT 460* 541*   Cardiac Enzymes: Recent Labs    12/16/23 1510 12/16/23 1801  TROPONINIHS 17 16   BNP: No results for input(s): BNP in the last 72 hours. D-Dimer: No results for input(s): DDIMER in the last 72 hours. Hemoglobin A1C: No results for input(s): HGBA1C in the last 72 hours. Fasting Lipid Panel: No results for input(s): CHOL, HDL, LDLCALC, TRIG, CHOLHDL, LDLDIRECT in the last 72 hours. Thyroid  Function Tests: Recent Labs    12/16/23 1801  TSH 8.000*   Anemia Panel: No results for input(s): VITAMINB12, FOLATE, FERRITIN, TIBC, IRON, RETICCTPCT in the last 72 hours.   Radiology: ECHOCARDIOGRAM COMPLETE Result Date: 12/17/2023    ECHOCARDIOGRAM REPORT   Patient Name:   Jerry Mcpherson Tillman Date of Exam: 12/17/2023 Medical Rec #:  [id number]           Height:       70.0 in Accession #:    [id number]          Weight:       135.0 lb Date of Birth:  [birth year]           BSA:          1.766 m Patient Age:    83 years            BP:           122/73 mmHg Patient Gender: M                   HR:           95 bpm. Exam Location:  [facility] Procedure: 2D Echo, Cardiac Doppler and Color Doppler (Both Spectral and Color            Flow Doppler were utilized during procedure). Indications:     Syncope R55  History:         Patient has no prior history of Echocardiogram examinations.  Sonographer:     Thedora Louder RDCS, FASE Referring Phys:  [id number] EMERY LITTIE FUSS Diagnosing Phys: Denyse Bathe  Sonographer Comments: Technically difficult study due to poor echo windows and suboptimal parasternal window. Image acquisition challenging due to respiratory motion. This study was performed with patient sitting up. IMPRESSIONS  1. Left ventricular ejection fraction, by estimation, is 35 to 40%. The left ventricle has moderate to severely decreased function. The left ventricle demonstrates global hypokinesis. The left ventricular internal cavity size was  severely dilated. There  is mild concentric left ventricular hypertrophy. Left ventricular diastolic parameters are consistent with Grade III diastolic dysfunction (restrictive).  2. Right ventricular systolic function is moderately reduced. The right ventricular size is moderately enlarged. Mildly increased right ventricular wall thickness.  3. Left atrial size was severely dilated.  4. Right atrial size was severely dilated.  5. The mitral valve is abnormal. Mild to moderate mitral valve regurgitation.  6. The aortic valve is calcified. Aortic valve regurgitation is trivial. Moderate to severe aortic valve stenosis. Conclusion(s)/Recommendation(s): Findings consistent with severe valvular heart disease. FINDINGS  Left Ventricle: Left ventricular ejection fraction, by estimation, is 35 to 40%. The left ventricle has moderate to severely decreased function. The left ventricle demonstrates global hypokinesis. Strain was performed and the global longitudinal strain is indeterminate. The left ventricular internal cavity size was severely dilated. There is mild concentric left ventricular hypertrophy. Left ventricular diastolic parameters are consistent with Grade III diastolic dysfunction (restrictive). Right Ventricle: The right ventricular size is moderately enlarged. Mildly increased right ventricular wall thickness. Right ventricular systolic function is moderately reduced. Left Atrium: Left atrial size was severely dilated. Right Atrium: Right atrial size was severely dilated. Pericardium: There is no evidence of pericardial effusion. Mitral Valve: The mitral valve is abnormal. Mild to moderate mitral valve regurgitation. Tricuspid Valve: The tricuspid valve is grossly normal. Tricuspid valve regurgitation is trivial. Aortic Valve: Due to cardiomyopathy may have low pressure gradiant and may have severe aortic stenosis. The aortic valve is calcified. Aortic valve regurgitation is trivial. Moderate to severe aortic  stenosis is present. Aortic valve mean gradient measures 15.8 mmHg. Aortic valve peak gradient measures 19.8 mmHg. Aortic valve area, by VTI measures 1.39 cm. Pulmonic Valve: The pulmonic valve was grossly normal. Pulmonic valve regurgitation is trivial. Aorta: The aortic root, ascending aorta and aortic arch are all structurally normal, with no evidence of dilitation or obstruction. IAS/Shunts: No atrial level shunt detected by color flow Doppler. Additional Comments: 3D was performed not requiring image post processing on an independent workstation and was indeterminate.  LEFT VENTRICLE PLAX 2D LVIDd:         5.50 cm      Diastology LVIDs:         4.50 cm      LV e' medial:    4.57 cm/s LV PW:         1.30 cm      LV E/e' medial:  19.4 LV IVS:        1.30 cm      LV e' lateral:   3.37 cm/s LVOT diam:     2.20 cm      LV E/e' lateral: 26.3 LV SV:         72 LV SV Index:   41 LVOT Area:     3.80 cm  LV Volumes (MOD) LV vol d, MOD A2C:  135.0 ml LV vol d, MOD A4C: 146.0 ml LV vol s, MOD A2C: 65.1 ml LV vol s, MOD A4C: 88.3 ml LV SV MOD A2C:     69.9 ml LV SV MOD A4C:     146.0 ml LV SV MOD BP:      65.8 ml RIGHT VENTRICLE RV Basal diam:  3.50 cm RV S prime:     22.00 cm/s TAPSE (M-mode): 3.5 cm LEFT ATRIUM             Index        RIGHT ATRIUM           Index LA diam:        3.90 cm 2.21 cm/m   RA Area:     14.80 cm LA Vol (A2C):   60.3 ml 34.14 ml/m  RA Volume:   36.10 ml  20.44 ml/m LA Vol (A4C):   39.2 ml 22.19 ml/m LA Biplane Vol: 49.4 ml 27.97 ml/m  AORTIC VALVE                     PULMONIC VALVE AV Area (Vmax):    1.59 cm      PV Vmax:        0.96 m/s AV Area (Vmean):   1.26 cm      PV Peak grad:   3.7 mmHg AV Area (VTI):     1.39 cm      RVOT Peak grad: 3 mmHg AV Vmax:           222.62 cm/s AV Vmean:          184.800 cm/s AV VTI:            0.518 m AV Peak Grad:      19.8 mmHg AV Mean Grad:      15.8 mmHg LVOT Vmax:         93.40 cm/s LVOT Vmean:        61.200 cm/s LVOT VTI:          0.190 m LVOT/AV  VTI ratio: 0.37  AORTA Ao Root diam: 3.10 cm Ao Asc diam:  3.30 cm MITRAL VALVE               TRICUSPID VALVE MV Area (PHT): 6.22 cm    TR Peak grad:   10.5 mmHg MV Decel Time: 122 msec    TR Vmax:        162.00 cm/s MV E velocity: 88.60 cm/s                            SHUNTS                            Systemic VTI:  0.19 m                            Systemic Diam: 2.20 cm Denyse Bathe Electronically signed by Denyse Bathe Signature Date/Time: 12/17/2023/1:12:32 PM    Final    MR BRAIN WO CONTRAST Result Date: 12/16/2023 CLINICAL DATA:  Syncope/presyncope, cerebrovascular cause suspected EXAM: MRI HEAD WITHOUT CONTRAST TECHNIQUE: Multiplanar, multiecho pulse sequences of the brain and surrounding structures were obtained without intravenous contrast. COMPARISON:  MRI head 03/24/2018. FINDINGS: Brain: No acute infarction, acute hemorrhage, hydrocephalus, extra-axial collection or mass lesion. Small remote right cerebellar infarct. Small remote left corona radiata  lacunar infarct and mild for age chronic microvascular ischemic disease. Focus susceptibility artifact in the left thalamus, compatible with prior hemorrhage. Cerebral atrophy. Vascular: Major arterial flow voids are maintained at the skull base. Skull and upper cervical spine: Normal marrow signal. Sinuses/Orbits: Severe bilateral maxillary sinus mucosal thickening. No acute orbital findings. IMPRESSION: 1. No evidence of acute intracranial abnormality. 2. Small remote lacunar infarcts and prior left thalamic hemorrhage. 3. Severe bilateral maxillary sinus mucosal thickening. Electronically Signed   By: Gilmore GORMAN Molt M.D.   On: 12/16/2023 20:36    ECHO as above.  TELEMETRY reviewed by me 12/19/2023: SR, PACs 80s  EKG reviewed by me: Multifocal atrial tachycardia, rate 105 bpm with nonspecific ST-T wave changes  Data reviewed by me 12/19/2023: last 24h vitals tele labs imaging I/O's hospitalist progress notes.  Principal Problem:   Syncope and  collapse Active Problems:   Primary hypertension   Vertigo   Polycythemia vera (HCC)   Hypothyroidism   Atrial flutter (HCC)   Malignant neoplasm of overlapping sites of bladder (HCC)   PAD (peripheral artery disease) (HCC)   UTI (urinary tract infection)   Syncope    ASSESSMENT AND PLAN:  Christiano Blandon is a 83 y.o. adult  with a past medical history of retention, recurrent vertigo, polycythemia vera, history of DVTs, hypothyroidism, paroxysmal atrial fibrillation/flutter, history of bladder cancer, PAD who presented to the ED on 12/16/2023 for multiple episodes of syncope. Patient states they occur while sitting and has tachycardia with lightheadedness prior to syncopal episode. Cardiology was consulted for further evaluation.   # New HFrEF (EF 35-40%) # Hypertension Echo this admission reveals newly reduced EF 35-40%, no wall hypokinesis, mild concentric LVH, grade 3 diastolic dysfunction. Appears euvolemic on exam. -Discontinue enalapril  2.5 mg daily due to hx angioedema allergy. (Of note, had documented allergy to losartan causing angioedema). -Holding dapagliflozin  10 mg daily due to leukocytosis and current UTI. Consider resuming at outpatient follow-up when WBC improves.  -Will plan to resume home Lasix  20 mg prior to discharge or when patient appears volume up. -Metoprolol  as stated below. -Will plan to initiate spironolactone when renal function stabilizes.  -Recommend outpatient ischemic evaluation.  # Moderate to severe aortic stenosis Echo from 05/2023 reveals moderate paradoxical low-flow aortic stenosis.  Echo this admission revealed moderate to severe aortic stenosis. -Recommend outpatient cardiology follow-up to discussed possible benefit from TAVR.  # Syncope Syncopal episodes may be related to severe AS associated with low flow state due to cardiomyopathy. -Discussed with patient that he cannot drive for at least 6 months/until syncope work-up is complete.  Syncopal episodes occur at rest and is very dangerous if patient continuous to drive. Patient very irritable when discussing this and states he drives himself places only. Asked if family member can help driving in the meantime and patient gets very upset. -Plan to place a cardiac monitor on discharge for further evaluation of syncope.  # Paroxysmal atrial fibrillation/flutter # Hx CVA Per tele having multiple episodes of AF/AFL RVR.  -Monitor and replenish electrolytes for a goal K >4, Mag >2  -Continue home Eliquis  2.5 mg twice daily for stroke risk reduction. -Ordered PO amio 400 mg BID for 10 days, then 200 mg daily for rhythm control. -Will plan to resume home metoprolol  succinate 12.5 mg when BP/HR allow.  This patient's plan of care was discussed and created with Dr. Ammon and he is in agreement.  Signed: Dorene Comfort, PA-C  12/19/2023, 8:43 AM [facility]

## 2023-12-19 NOTE — Progress Notes (Signed)
 Orders only for 2 week holter monitor for syncope.  GABRIELLA COMUNALE DECOSTE, PA

## 2023-12-20 ENCOUNTER — Other Ambulatory Visit: Payer: Self-pay

## 2023-12-20 DIAGNOSIS — R55 Syncope and collapse: Secondary | ICD-10-CM | POA: Diagnosis not present

## 2023-12-20 LAB — CBC
HCT: 45.8 % (ref 39.0–52.0)
Hemoglobin: 14.1 g/dL (ref 13.0–17.0)
MCH: 27.4 pg (ref 26.0–34.0)
MCHC: 30.8 g/dL (ref 30.0–36.0)
MCV: 88.9 fL (ref 80.0–100.0)
Platelets: 558 K/uL — ABNORMAL HIGH (ref 150–400)
RBC: 5.15 MIL/uL (ref 4.22–5.81)
RDW: 26.5 % — ABNORMAL HIGH (ref 11.5–15.5)
WBC: 21.1 K/uL — ABNORMAL HIGH (ref 4.0–10.5)
nRBC: 0 % (ref 0.0–0.2)

## 2023-12-20 LAB — BASIC METABOLIC PANEL WITH GFR
Anion gap: 8 (ref 5–15)
BUN: 54 mg/dL — ABNORMAL HIGH (ref 8–23)
CO2: 20 mmol/L — ABNORMAL LOW (ref 22–32)
Calcium: 8.7 mg/dL — ABNORMAL LOW (ref 8.9–10.3)
Chloride: 112 mmol/L — ABNORMAL HIGH (ref 98–111)
Creatinine, Ser: 1.74 mg/dL — ABNORMAL HIGH (ref 0.61–1.24)
GFR, Estimated: 38 mL/min — ABNORMAL LOW (ref >60)
Glucose, Bld: 109 mg/dL — ABNORMAL HIGH (ref 70–99)
Potassium: 4.4 mmol/L (ref 3.5–5.1)
Sodium: 140 mmol/L (ref 135–145)

## 2023-12-20 LAB — GLUCOSE, CAPILLARY: Glucose-Capillary: 104 mg/dL — ABNORMAL HIGH (ref 70–99)

## 2023-12-20 LAB — MAGNESIUM: Magnesium: 2.6 mg/dL — ABNORMAL HIGH (ref 1.7–2.4)

## 2023-12-20 MED ORDER — FUROSEMIDE 20 MG PO TABS: 40.0000 mg | ORAL_TABLET | Freq: Every day | ORAL | Status: DC | PRN

## 2023-12-20 MED ORDER — CIPROFLOXACIN HCL 500 MG PO TABS
500.0000 mg | ORAL_TABLET | Freq: Every day | ORAL | 0 refills | Status: DC
  Filled 2023-12-20: qty 7, 7d supply, fill #0

## 2023-12-20 MED ORDER — AMIODARONE HCL 200 MG PO TABS
ORAL_TABLET | ORAL | 0 refills | Status: DC
  Filled 2023-12-20: qty 70, 40d supply, fill #0

## 2023-12-20 MED ORDER — APIXABAN 2.5 MG PO TABS
2.5000 mg | ORAL_TABLET | Freq: Two times a day (BID) | ORAL | 0 refills | Status: AC
  Filled 2023-12-20: qty 60, 30d supply, fill #0

## 2023-12-20 NOTE — Plan of Care (Signed)
  Problem: Cardiac: Goal: Will achieve and/or maintain adequate cardiac output Outcome: Progressing   Problem: Physical Regulation: Goal: Complications related to the disease process, condition or treatment will be avoided or minimized Outcome: Progressing   Problem: Education: Goal: Knowledge of General Education information will improve Description: Including pain rating scale, medication(s)/side effects and non-pharmacologic comfort measures Outcome: Progressing   Problem: Health Behavior/Discharge Planning: Goal: Ability to manage health-related needs will improve Outcome: Progressing   Problem: Clinical Measurements: Goal: Diagnostic test results will improve Outcome: Progressing Goal: Respiratory complications will improve Outcome: Progressing

## 2023-12-20 NOTE — Progress Notes (Addendum)
   12/20/23 1200  Mobility  Activity Ambulated with assistance;Stood at bedside;Respositioned in chair  Level of Assistance Contact guard assist, steadying assist  Assistive Device Front wheel walker  Distance Ambulated (ft) 210 ft  Range of Motion/Exercises Active Assistive  Activity Response Tolerated well  Mobility visit 1 Mobility  Mobility Specialist Start Time (ACUTE ONLY) 1148  Mobility Specialist Stop Time (ACUTE ONLY) 1205  Mobility Specialist Time Calculation (min) (ACUTE ONLY) 17 min   Mobility Specialist - Progress Note  Pt was in bed upon arrival. Pt ambulated well. Pt had no signs of cognitive decline. Pt breathing was normal. Pt repositioned in chair after activity. Needs were in reach with chair alarm on.  Jerry Mcpherson Mobility Specialist 12/20/23, 12:10 PM

## 2023-12-20 NOTE — Plan of Care (Signed)

## 2023-12-20 NOTE — Discharge Summary (Signed)
 Physician Discharge Summary   Patient: Jerry Mcpherson MRN: [id number]  DOB: 1940/09/19   Admit:     Date of Admission: 12/16/2023 Admitted from: home   Discharge: Date of discharge: 12/20/23 Disposition: Home Condition at discharge: good  CODE STATUS: FULL CODE     Discharge Physician: Laneta Blunt, DO [facility]     PCP: [name], MD  Recommendations for Outpatient Follow-up:  Follow up with PCP [name], MD in 1-2 weeks   Discharge Instructions     (HEART FAILURE PATIENTS) Call MD:  Anytime you have any of the following symptoms: 1) 3 pound weight gain in 24 hours or 5 pounds in 1 week 2) shortness of breath, with or without a dry hacking cough 3) swelling in the hands, feet or stomach 4) if you have to sleep on extra pillows at night in order to breathe.   Complete by: As directed    Call MD for:  extreme fatigue   Complete by: As directed    Call MD for:  persistant dizziness or light-headedness   Complete by: As directed    Diet - low sodium heart healthy   Complete by: As directed    Discharge instructions   Complete by: As directed    We have been treating you in the hospital for the following: 1) urinary infection - please finish the ciprofloxacin  antibiotics 2) congestive heart failure - your heart is not pumping as strong as it used to and it doesn't relax as well as it used to, this can affect your energy levels. Your cardiologist will check up on this.  3) aortic stenosis - one of the valves in your heart is tight and affecting blood flow through the heart. This can also cause tiredness and sometimes dizziness with drops in blood pressure. There are procedures that might be able to fix this valve. Your cardiologist will check up on this.  3) atrial fibrillation - abnormal electrical signal causes the heart to beat too fast and when this happens you can get dizzy / light headed / drop blood pressure. We think your passing out  spells are because of this problem. This is why we recommend you do NOT drive until or unless your cardiologist allows you to drive. We have placed a heart monitor to check on this, and we have started a new medicine called amiodarone  which has already started to work.   Increase activity slowly   Complete by: As directed          Discharge Diagnoses: Principal Problem:   Syncope and collapse Active Problems:   Primary hypertension   Vertigo   Polycythemia vera (HCC)   Hypothyroidism   Atrial flutter (HCC)   Malignant neoplasm of overlapping sites of bladder (HCC)   PAD (peripheral artery disease) (HCC)   UTI (urinary tract infection)   Syncope     Hospital course / significant events:   HPI: Jerry Mcpherson is a 83 y.o. male with medical history significant of essential hypertension, recurrent vertigo, polycythemia vera, hypothyroidism, history of DVTs, atrial fibrillation and flutter, history of bladder cancer peripheral arterial disease, UTI, who presents to the ER with multiple syncopal episodes. Of note, has been seen 12/09/23 by his PCP w/ concern for SOB and weight loss, pt complains of worsening fatigue for some time now. Noted high BMP, leukocytosis  09/05: admitted for syncope likely d/t UTI, continue on ceftriaxone . MRI of the brain showing no CVA. Echo  pending  09/06: echo pending. Did well w/ PT/OT ok for outpatinet f/u. Continue abx and await cultures  09/07: Echo LVEF 35-40, global hypokinesis and severely dilated, grade III diastolic df --> cardiology consult new dx CHF. UCx (+)klebsiella and citrobacter (S)Cipro   09/08: adjusting meds, added amio, he's having runs of afib corresponding to symptoms of lightheadedness  09/09: Less frequent episodes of AF/AFL RVR. Zio monitor placed, have confirmed follow up with his cardiology office at [facility].      Consultants:  Cardiology   Procedures/Surgeries: none      ASSESSMENT & PLAN:   recurrent syncopal  episodes likely hypotensive d/t on BP meds and w/ UTI, malnutrition, PVD/PAD, HFrEF, AS, PAfib/AF Treat UTI as below Treat cardiac disease as below  PT/OT - no concerns  Holding some antihypertensives given new amio rx and soft BP   Complicated UTI Hx cystectomy d/t bladder mass UCx (+) Klebsiella 50K/mL CFU UCx (+) Citrobacter >100K/mL CFU Both sensitive to ceftriaxone  which he's been getting Both sensitive to po cipro , tmp/smx Ceftriaxone  --> cipro  for complicated UTI, pharmacy to confirm renal dose  Follow w/ urology   Paroxysmal atrial fibrillation/flutter Hx CVA Per tele having multiple episodes of AF/AFL RVR.  goal K >4, Mag >2 Eliquis  Started PO amio 400 mg BID for 10 days, then 200 mg daily for rhythm control. resume home metoprolol  succinate 12.5 mg when BP/HR allow, have held for now  Cardiology to follow  cardiac monitoring on discharge w/ Zio  New diagnosis HFrEF w/ grade III diastolic dysfunction Mod-Severe aortic stenosis  Mild-Mod mitral regurg  LVEF 35-40, LV global hypokinesis and severely dilated, grade III diastolic df  Cardiology to follow   Have held beta blocker / antihypertensives d/t low BP  Cardiology initially added farxiga  10 mg and enalapril  2.5 lasix  as needed   resume home metoprolol  succinate 12.5 mg when BP/HR allow. spironolactone when renal function stabilizes.  outpatient ischemic evaluation.  Moderate to severe aortic stenosis Echo from 05/2023 reveals moderate paradoxical low-flow aortic stenosis.  Echo this admission revealed moderate to severe aortic stenosis. outpatient cardiology follow-up to discussed possible benefit from TAVR.  Peripheral arterial disease Associated ulcer LLE Plavix  + Eliquis   Weight loss, ongoing/chronic Fatigue, ongoing/chronic Certainly UTI and CHF can contribute to this  Consider CT c/a/p - Ideally would get CT w/ contrast but may need to consider MRI / outpatient f/u given renal function  CKD3b Cr  appears baseline Monitor BMP  Polycythemia vera followed by hematology ourpatient Appears to be stable Continue hydroxyurea     Hypothyroidism Continue levothyroxine    Essential hypertension Hold antihypertensives beta blocker and thiazide d/t low BP / renal function Other cardiac meds as above re: CHF dx  History of bladder cancer:  Cystectomy, has abd pouch drains urine Change out pouch  Continue to monitor   History of DVT Eliquis        Borderline underweight based on BMI: Body mass index is 19.37 kg/m.SABRA Significantly low or high BMI is associated with higher medical risk.  Pt appears frail and notes poor po intake, his albumin is low, (+)malnourished  Underweight - under 18  overweight - 25 to 29 obese - 30 or more Class 1 obesity: BMI of 30.0 to 34 Class 2 obesity: BMI of 35.0 to 39 Class 3 obesity: BMI of 40.0 to 49 Super Morbid Obesity: BMI 50-59 Super-super Morbid Obesity: BMI 60+ Healthy nutrition and physical activity advised as adjunct to other disease management and risk reduction treatments   Reviewed  the above w/ patient and nephew prior to discharge          Discharge Instructions  Allergies as of 12/20/2023       Reactions   Losartan Potassium    angioedema Other reaction(s): Angioedema   Shellfish Allergy Swelling, Rash   Rash, itching, swelling Currently eats SHELLFISH without symptoms   Shellfish-derived Products Dermatitis, Swelling   Rash, itching, swelling    Rash, itching, swelling   Currently eats SHELLFISH without symptoms   Doxycycline  Other (See Comments)   Blisters on hand   Ultram  [tramadol Hcl] Nausea And Vomiting   Celecoxib Itching, Swelling, Rash, Other (See Comments)   Cannot take when eating shellfish   Codeine Palpitations   Heart racing   Cyclobenzaprine Other (See Comments)   Musculoskeletal therapy agents cause Excessive sedation Musculoskeletal therapy agents cause excessive sedation   Tramadol Hcl Nausea  And Vomiting        Medication List     STOP taking these medications    hydrochlorothiazide  25 MG tablet Commonly known as: HYDRODIURIL    metoprolol  succinate 50 MG 24 hr tablet Commonly known as: TOPROL -XL       TAKE these medications    amiodarone  200 MG tablet Commonly known as: PACERONE  Take 2 tablets (400 mg total) by mouth 2 (two) times daily for 10 days, THEN 1 tablet (200 mg total) daily. Start taking on: December 20, 2023   ciprofloxacin  500 MG tablet Commonly known as: CIPRO  Take 1 tablet (500 mg total) by mouth daily with breakfast. Start taking on: December 21, 2023   clopidogrel  75 MG tablet Commonly known as: PLAVIX  Take 75 mg by mouth daily.   diclofenac  Sodium 1 % Gel Commonly known as: Voltaren  Apply 4 g topically 4 (four) times daily as needed.   docusate sodium  100 MG capsule Commonly known as: COLACE Take 1 capsule by mouth daily as needed.   Eliquis  2.5 MG Tabs tablet Generic drug: apixaban  Take 1 tablet (2.5 mg total) by mouth 2 (two) times daily. What changed:  medication strength how much to take   ferrous sulfate  325 (65 FE) MG tablet Take 325 mg by mouth daily.   furosemide  20 MG tablet Commonly known as: LASIX  Take 2 tablets (40 mg total) by mouth daily as needed for fluid or edema. What changed:  when to take this reasons to take this   hydroxyurea  500 MG capsule Commonly known as: HYDREA  Take 500 mg by mouth as directed. 2 tablets 3 days a week, and 1 tablet all other days   levothyroxine  75 MCG tablet Commonly known as: SYNTHROID  TAKE 1 TABLET BY MOUTH EVERY DAY   MULTIVITAMIN ADULT PO Take 1 tablet by mouth daily.   oxyCODONE -acetaminophen  10-325 MG tablet Commonly known as: PERCOCET Take 1 tablet by mouth every 6 (six) hours as needed. for pain   valACYclovir  1000 MG tablet Commonly known as: VALTREX  TAKE 1 TABLET BY MOUTH TWICE A DAY AS NEEDED               Durable Medical Equipment  (From  admission, onward)           Start     Ordered   12/19/23 1600  For home use only DME Walker rolling  Once       Question Answer Comment  Walker: With 5 Inch Wheels   Patient needs a walker to treat with the following condition Debility   Patient needs a walker to treat with the following  condition CHF (congestive heart failure) (HCC)   Patient needs a walker to treat with the following condition Aortic stenosis      12/19/23 1559             Follow-up Information     [name], MD. Go on 12/28/2023.   Specialty: Family Medicine Why: @ 9:45am  hospital follow up Contact information: [address] [place] [address] [phone]         Glennda Corean Helling, NP Follow up on 12/28/2023.   Specialty: Cardiology Why: @ 9am  hospital follow up - review cardiac monitor, follow up on CHF and aortic stenosis Contact information: [address] [place] [address] [phone]                 Allergies  Allergen Reactions   Losartan Potassium     angioedema Other reaction(s): Angioedema   Shellfish Allergy Swelling and Rash    Rash, itching, swelling   Currently eats SHELLFISH without symptoms   Shellfish-Derived Products Dermatitis and Swelling    Rash, itching, swelling    Rash, itching, swelling   Currently eats SHELLFISH without symptoms   Doxycycline  Other (See Comments)    Blisters on hand   Ultram  [Tramadol Hcl] Nausea And Vomiting   Celecoxib Itching, Swelling, Rash and Other (See Comments)    Cannot take when eating shellfish   Codeine Palpitations    Heart racing   Cyclobenzaprine Other (See Comments)    Musculoskeletal therapy agents cause Excessive sedation Musculoskeletal therapy agents cause excessive sedation   Tramadol Hcl Nausea And Vomiting     Subjective: pt feeling well today, no additional spells, no cp/sob   Discharge Exam: BP 127/74 (BP Location: Right Arm)   Pulse 80    Temp 97.9 F (36.6 C) (Oral)   Resp 18   Ht 5' 10 (1.778 m)   Wt 65 kg   SpO2 95%   BMI 20.56 kg/m  General: Pt is alert, awake, not in acute distress Cardiovascular: RRR, S1/S2 Respiratory: CTA bilaterally, dimished breath sounds  Abdominal: Soft, NT, ND, bowel sounds + Extremities: no edema, no cyanosis     The results of significant diagnostics from this hospitalization (including imaging, microbiology, ancillary and laboratory) are listed below for reference.     Microbiology: Recent Results (from the past 240 hours)  Urine Culture     Status: Abnormal   Collection Time: 12/16/23  3:59 PM   Specimen: Urine, Suprapubic  Result Value Ref Range Status   Specimen Description   Final    URINE, SUPRAPUBIC Performed at [facility], [address], [place], [place] [address]    Special Requests   Final    NONE Performed at [facility], [address] [place], [place] [address]    Culture (A)  Final    50,000 COLONIES/mL KLEBSIELLA PNEUMONIAE >=100,000 COLONIES/mL CITROBACTER KOSERI    Report Status 12/18/2023 FINAL  Final   Organism ID, Bacteria KLEBSIELLA PNEUMONIAE (A)  Final   Organism ID, Bacteria CITROBACTER KOSERI (A)  Final      Susceptibility   Citrobacter koseri - MIC*    CEFEPIME <=0.12 SENSITIVE Sensitive     ERTAPENEM <=0.12 SENSITIVE Sensitive     CEFTRIAXONE  <=0.25 SENSITIVE Sensitive     CIPROFLOXACIN  <=0.06 SENSITIVE Sensitive     GENTAMICIN <=1 SENSITIVE Sensitive     NITROFURANTOIN 32 SENSITIVE Sensitive     TRIMETH/SULFA <=20 SENSITIVE Sensitive  PIP/TAZO Value in next row Sensitive ug/mL     <=4 SENSITIVEThis is a modified FDA-approved test that has been validated and its performance characteristics determined by the reporting laboratory.  This laboratory is certified under the Clinical Laboratory Improvement Amendments CLIA as qualified to perform high complexity clinical laboratory testing.    MEROPENEM Value in next  row Sensitive      <=4 SENSITIVEThis is a modified FDA-approved test that has been validated and its performance characteristics determined by the reporting laboratory.  This laboratory is certified under the Clinical Laboratory Improvement Amendments CLIA as qualified to perform high complexity clinical laboratory testing.    * >=100,000 COLONIES/mL CITROBACTER KOSERI   Klebsiella pneumoniae - MIC*    AMPICILLIN Value in next row Resistant      <=4 SENSITIVEThis is a modified FDA-approved test that has been validated and its performance characteristics determined by the reporting laboratory.  This laboratory is certified under the Clinical Laboratory Improvement Amendments CLIA as qualified to perform high complexity clinical laboratory testing.    CEFAZOLIN  (URINE) Value in next row Sensitive      2 SENSITIVEThis is a modified FDA-approved test that has been validated and its performance characteristics determined by the reporting laboratory.  This laboratory is certified under the Clinical Laboratory Improvement Amendments CLIA as qualified to perform high complexity clinical laboratory testing.    CEFEPIME Value in next row Sensitive      2 SENSITIVEThis is a modified FDA-approved test that has been validated and its performance characteristics determined by the reporting laboratory.  This laboratory is certified under the Clinical Laboratory Improvement Amendments CLIA as qualified to perform high complexity clinical laboratory testing.    ERTAPENEM Value in next row Sensitive      2 SENSITIVEThis is a modified FDA-approved test that has been validated and its performance characteristics determined by the reporting laboratory.  This laboratory is certified under the Clinical Laboratory Improvement Amendments CLIA as qualified to perform high complexity clinical laboratory testing.    CEFTRIAXONE  Value in next row Sensitive      2 SENSITIVEThis is a modified FDA-approved test that has been validated  and its performance characteristics determined by the reporting laboratory.  This laboratory is certified under the Clinical Laboratory Improvement Amendments CLIA as qualified to perform high complexity clinical laboratory testing.    CIPROFLOXACIN  Value in next row Sensitive      2 SENSITIVEThis is a modified FDA-approved test that has been validated and its performance characteristics determined by the reporting laboratory.  This laboratory is certified under the Clinical Laboratory Improvement Amendments CLIA as qualified to perform high complexity clinical laboratory testing.    GENTAMICIN Value in next row Sensitive      2 SENSITIVEThis is a modified FDA-approved test that has been validated and its performance characteristics determined by the reporting laboratory.  This laboratory is certified under the Clinical Laboratory Improvement Amendments CLIA as qualified to perform high complexity clinical laboratory testing.    NITROFURANTOIN Value in next row Intermediate      2 SENSITIVEThis is a modified FDA-approved test that has been validated and its performance characteristics determined by the reporting laboratory.  This laboratory is certified under the Clinical Laboratory Improvement Amendments CLIA as qualified to perform high complexity clinical laboratory testing.    TRIMETH/SULFA Value in next row Sensitive      2 SENSITIVEThis is a modified FDA-approved test that has been validated and its performance characteristics determined by the reporting laboratory.  This  laboratory is certified under the Clinical Laboratory Improvement Amendments CLIA as qualified to perform high complexity clinical laboratory testing.    AMPICILLIN/SULBACTAM Value in next row Sensitive      2 SENSITIVEThis is a modified FDA-approved test that has been validated and its performance characteristics determined by the reporting laboratory.  This laboratory is certified under the Clinical Laboratory Improvement  Amendments CLIA as qualified to perform high complexity clinical laboratory testing.    PIP/TAZO Value in next row Sensitive ug/mL     <=4 SENSITIVEThis is a modified FDA-approved test that has been validated and its performance characteristics determined by the reporting laboratory.  This laboratory is certified under the Clinical Laboratory Improvement Amendments CLIA as qualified to perform high complexity clinical laboratory testing.    MEROPENEM Value in next row Sensitive      <=4 SENSITIVEThis is a modified FDA-approved test that has been validated and its performance characteristics determined by the reporting laboratory.  This laboratory is certified under the Clinical Laboratory Improvement Amendments CLIA as qualified to perform high complexity clinical laboratory testing.    * 50,000 COLONIES/mL KLEBSIELLA PNEUMONIAE     Labs: BNP (last 3 results) Recent Labs    12/09/23 1137  BNP 1,624.8*   Basic Metabolic Panel: Recent Labs  Lab 12/16/23 1510 12/17/23 0423 12/18/23 0443 12/20/23 0545  NA 137 137 138 140  K 3.9 3.9 4.0 4.4  CL 111 112* 112* 112*  CO2 16* 20* 16* 20*  GLUCOSE 137* 95 100* 109*  BUN 41* 41* 44* 54*  CREATININE 2.09* 2.09* 1.86* 1.74*  CALCIUM  8.6* 8.2* 8.3* 8.7*  MG  --   --   --  2.6*   Liver Function Tests: Recent Labs  Lab 12/16/23 1510 12/17/23 0423  AST 13* 12*  ALT 7 6  ALKPHOS 71 66  BILITOT 1.1 0.8  PROT 6.9 6.4*  ALBUMIN 3.6 3.3*   No results for input(s): LIPASE, AMYLASE in the last 168 hours. No results for input(s): AMMONIA in the last 168 hours. CBC: Recent Labs  Lab 12/16/23 1510 12/17/23 0423 12/18/23 0443 12/20/23 0545  WBC 21.0* 18.2* 19.9* 21.1*  NEUTROABS  --   --  15.9*  --   HGB 14.6 13.3 13.9 14.1  HCT 46.2 43.2 43.8 45.8  MCV 86.4 87.6 86.7 88.9  PLT 581* 460* 541* 558*   Cardiac Enzymes: No results for input(s): CKTOTAL, CKMB, CKMBINDEX, TROPONINI in the last 168 hours. BNP: Invalid  input(s): POCBNP CBG: Recent Labs  Lab 12/17/23 0421 12/18/23 0432 12/19/23 0507 12/20/23 0456  GLUCAP 91 117* 101* 104*   D-Dimer No results for input(s): DDIMER in the last 72 hours. Hgb A1c No results for input(s): HGBA1C in the last 72 hours. Lipid Profile No results for input(s): CHOL, HDL, LDLCALC, TRIG, CHOLHDL, LDLDIRECT in the last 72 hours. Thyroid  function studies No results for input(s): TSH, T4TOTAL, T3FREE, THYROIDAB in the last 72 hours.  Invalid input(s): FREET3 Anemia work up No results for input(s): VITAMINB12, FOLATE, FERRITIN, TIBC, IRON, RETICCTPCT in the last 72 hours. Urinalysis    Component Value Date/Time   COLORURINE YELLOW (A) 12/16/2023 1559   APPEARANCEUR TURBID (A) 12/16/2023 1559   LABSPEC 1.009 12/16/2023 1559   PHURINE 6.0 12/16/2023 1559   GLUCOSEU NEGATIVE 12/16/2023 1559   HGBUR MODERATE (A) 12/16/2023 1559   BILIRUBINUR NEGATIVE 12/16/2023 1559   BILIRUBINUR negative 01/09/2020 0949   KETONESUR NEGATIVE 12/16/2023 1559   PROTEINUR 100 (A) 12/16/2023 1559   UROBILINOGEN 0.2  01/09/2020 0949   NITRITE POSITIVE (A) 12/16/2023 1559   LEUKOCYTESUR LARGE (A) 12/16/2023 1559   Sepsis Labs Recent Labs  Lab 12/16/23 1510 12/17/23 0423 12/18/23 0443 12/20/23 0545  WBC 21.0* 18.2* 19.9* 21.1*   Microbiology Recent Results (from the past 240 hours)  Urine Culture     Status: Abnormal   Collection Time: 12/16/23  3:59 PM   Specimen: Urine, Suprapubic  Result Value Ref Range Status   Specimen Description   Final    URINE, SUPRAPUBIC Performed at [facility], [address] [place], [place] [address]    Special Requests   Final    NONE Performed at [facility], [address] [place], [place] [address]    Culture (A)  Final    50,000 COLONIES/mL KLEBSIELLA PNEUMONIAE >=100,000 COLONIES/mL CITROBACTER KOSERI    Report Status 12/18/2023 FINAL  Final   Organism ID,  Bacteria KLEBSIELLA PNEUMONIAE (A)  Final   Organism ID, Bacteria CITROBACTER KOSERI (A)  Final      Susceptibility   Citrobacter koseri - MIC*    CEFEPIME <=0.12 SENSITIVE Sensitive     ERTAPENEM <=0.12 SENSITIVE Sensitive     CEFTRIAXONE  <=0.25 SENSITIVE Sensitive     CIPROFLOXACIN  <=0.06 SENSITIVE Sensitive     GENTAMICIN <=1 SENSITIVE Sensitive     NITROFURANTOIN 32 SENSITIVE Sensitive     TRIMETH/SULFA <=20 SENSITIVE Sensitive     PIP/TAZO Value in next row Sensitive ug/mL     <=4 SENSITIVEThis is a modified FDA-approved test that has been validated and its performance characteristics determined by the reporting laboratory.  This laboratory is certified under the Clinical Laboratory Improvement Amendments CLIA as qualified to perform high complexity clinical laboratory testing.    MEROPENEM Value in next row Sensitive      <=4 SENSITIVEThis is a modified FDA-approved test that has been validated and its performance characteristics determined by the reporting laboratory.  This laboratory is certified under the Clinical Laboratory Improvement Amendments CLIA as qualified to perform high complexity clinical laboratory testing.    * >=100,000 COLONIES/mL CITROBACTER KOSERI   Klebsiella pneumoniae - MIC*    AMPICILLIN Value in next row Resistant      <=4 SENSITIVEThis is a modified FDA-approved test that has been validated and its performance characteristics determined by the reporting laboratory.  This laboratory is certified under the Clinical Laboratory Improvement Amendments CLIA as qualified to perform high complexity clinical laboratory testing.    CEFAZOLIN  (URINE) Value in next row Sensitive      2 SENSITIVEThis is a modified FDA-approved test that has been validated and its performance characteristics determined by the reporting laboratory.  This laboratory is certified under the Clinical Laboratory Improvement Amendments CLIA as qualified to perform high complexity clinical laboratory  testing.    CEFEPIME Value in next row Sensitive      2 SENSITIVEThis is a modified FDA-approved test that has been validated and its performance characteristics determined by the reporting laboratory.  This laboratory is certified under the Clinical Laboratory Improvement Amendments CLIA as qualified to perform high complexity clinical laboratory testing.    ERTAPENEM Value in next row Sensitive      2 SENSITIVEThis is a modified FDA-approved test that has been validated and its performance characteristics determined by the reporting laboratory.  This laboratory is certified under the Clinical Laboratory Improvement Amendments CLIA as qualified to perform high complexity clinical laboratory testing.    CEFTRIAXONE  Value in next row Sensitive  2 SENSITIVEThis is a modified FDA-approved test that has been validated and its performance characteristics determined by the reporting laboratory.  This laboratory is certified under the Clinical Laboratory Improvement Amendments CLIA as qualified to perform high complexity clinical laboratory testing.    CIPROFLOXACIN  Value in next row Sensitive      2 SENSITIVEThis is a modified FDA-approved test that has been validated and its performance characteristics determined by the reporting laboratory.  This laboratory is certified under the Clinical Laboratory Improvement Amendments CLIA as qualified to perform high complexity clinical laboratory testing.    GENTAMICIN Value in next row Sensitive      2 SENSITIVEThis is a modified FDA-approved test that has been validated and its performance characteristics determined by the reporting laboratory.  This laboratory is certified under the Clinical Laboratory Improvement Amendments CLIA as qualified to perform high complexity clinical laboratory testing.    NITROFURANTOIN Value in next row Intermediate      2 SENSITIVEThis is a modified FDA-approved test that has been validated and its performance characteristics  determined by the reporting laboratory.  This laboratory is certified under the Clinical Laboratory Improvement Amendments CLIA as qualified to perform high complexity clinical laboratory testing.    TRIMETH/SULFA Value in next row Sensitive      2 SENSITIVEThis is a modified FDA-approved test that has been validated and its performance characteristics determined by the reporting laboratory.  This laboratory is certified under the Clinical Laboratory Improvement Amendments CLIA as qualified to perform high complexity clinical laboratory testing.    AMPICILLIN/SULBACTAM Value in next row Sensitive      2 SENSITIVEThis is a modified FDA-approved test that has been validated and its performance characteristics determined by the reporting laboratory.  This laboratory is certified under the Clinical Laboratory Improvement Amendments CLIA as qualified to perform high complexity clinical laboratory testing.    PIP/TAZO Value in next row Sensitive ug/mL     <=4 SENSITIVEThis is a modified FDA-approved test that has been validated and its performance characteristics determined by the reporting laboratory.  This laboratory is certified under the Clinical Laboratory Improvement Amendments CLIA as qualified to perform high complexity clinical laboratory testing.    MEROPENEM Value in next row Sensitive      <=4 SENSITIVEThis is a modified FDA-approved test that has been validated and its performance characteristics determined by the reporting laboratory.  This laboratory is certified under the Clinical Laboratory Improvement Amendments CLIA as qualified to perform high complexity clinical laboratory testing.    * 50,000 COLONIES/mL KLEBSIELLA PNEUMONIAE   Imaging ECHOCARDIOGRAM COMPLETE Result Date: 12/17/2023    ECHOCARDIOGRAM REPORT   Patient Name:   Jerry Mcpherson Date of Exam: 12/17/2023 Medical Rec #:  [id number]           Height:       70.0 in Accession #:    [id number]          Weight:       135.0 lb Date  of Birth:  [birth year]           BSA:          1.766 m Patient Age:    83 years            BP:           122/73 mmHg Patient Gender: M                   HR:  95 bpm. Exam Location:  [facility] Procedure: 2D Echo, Cardiac Doppler and Color Doppler (Both Spectral and Color            Flow Doppler were utilized during procedure). Indications:     Syncope R55  History:         Patient has no prior history of Echocardiogram examinations.  Sonographer:     Thedora Louder RDCS, FASE Referring Phys:  [id number] EMERY LITTIE FUSS Diagnosing Phys: Denyse Bathe  Sonographer Comments: Technically difficult study due to poor echo windows and suboptimal parasternal window. Image acquisition challenging due to respiratory motion. This study was performed with patient sitting up. IMPRESSIONS  1. Left ventricular ejection fraction, by estimation, is 35 to 40%. The left ventricle has moderate to severely decreased function. The left ventricle demonstrates global hypokinesis. The left ventricular internal cavity size was severely dilated. There  is mild concentric left ventricular hypertrophy. Left ventricular diastolic parameters are consistent with Grade III diastolic dysfunction (restrictive).  2. Right ventricular systolic function is moderately reduced. The right ventricular size is moderately enlarged. Mildly increased right ventricular wall thickness.  3. Left atrial size was severely dilated.  4. Right atrial size was severely dilated.  5. The mitral valve is abnormal. Mild to moderate mitral valve regurgitation.  6. The aortic valve is calcified. Aortic valve regurgitation is trivial. Moderate to severe aortic valve stenosis. Conclusion(s)/Recommendation(s): Findings consistent with severe valvular heart disease. FINDINGS  Left Ventricle: Left ventricular ejection fraction, by estimation, is 35 to 40%. The left ventricle has moderate to severely decreased function. The left ventricle demonstrates global hypokinesis. Strain was  performed and the global longitudinal strain is indeterminate. The left ventricular internal cavity size was severely dilated. There is mild concentric left ventricular hypertrophy. Left ventricular diastolic parameters are consistent with Grade III diastolic dysfunction (restrictive). Right Ventricle: The right ventricular size is moderately enlarged. Mildly increased right ventricular wall thickness. Right ventricular systolic function is moderately reduced. Left Atrium: Left atrial size was severely dilated. Right Atrium: Right atrial size was severely dilated. Pericardium: There is no evidence of pericardial effusion. Mitral Valve: The mitral valve is abnormal. Mild to moderate mitral valve regurgitation. Tricuspid Valve: The tricuspid valve is grossly normal. Tricuspid valve regurgitation is trivial. Aortic Valve: Due to cardiomyopathy may have low pressure gradiant and may have severe aortic stenosis. The aortic valve is calcified. Aortic valve regurgitation is trivial. Moderate to severe aortic stenosis is present. Aortic valve mean gradient measures 15.8 mmHg. Aortic valve peak gradient measures 19.8 mmHg. Aortic valve area, by VTI measures 1.39 cm. Pulmonic Valve: The pulmonic valve was grossly normal. Pulmonic valve regurgitation is trivial. Aorta: The aortic root, ascending aorta and aortic arch are all structurally normal, with no evidence of dilitation or obstruction. IAS/Shunts: No atrial level shunt detected by color flow Doppler. Additional Comments: 3D was performed not requiring image post processing on an independent workstation and was indeterminate.  LEFT VENTRICLE PLAX 2D LVIDd:         5.50 cm      Diastology LVIDs:         4.50 cm      LV e' medial:    4.57 cm/s LV PW:         1.30 cm      LV E/e' medial:  19.4 LV IVS:        1.30 cm      LV e' lateral:   3.37 cm/s LVOT diam:     2.20 cm  LV E/e' lateral: 26.3 LV SV:         72 LV SV Index:   41 LVOT Area:     3.80 cm  LV Volumes (MOD)  LV vol d, MOD A2C: 135.0 ml LV vol d, MOD A4C: 146.0 ml LV vol s, MOD A2C: 65.1 ml LV vol s, MOD A4C: 88.3 ml LV SV MOD A2C:     69.9 ml LV SV MOD A4C:     146.0 ml LV SV MOD BP:      65.8 ml RIGHT VENTRICLE RV Basal diam:  3.50 cm RV S prime:     22.00 cm/s TAPSE (M-mode): 3.5 cm LEFT ATRIUM             Index        RIGHT ATRIUM           Index LA diam:        3.90 cm 2.21 cm/m   RA Area:     14.80 cm LA Vol (A2C):   60.3 ml 34.14 ml/m  RA Volume:   36.10 ml  20.44 ml/m LA Vol (A4C):   39.2 ml 22.19 ml/m LA Biplane Vol: 49.4 ml 27.97 ml/m  AORTIC VALVE                     PULMONIC VALVE AV Area (Vmax):    1.59 cm      PV Vmax:        0.96 m/s AV Area (Vmean):   1.26 cm      PV Peak grad:   3.7 mmHg AV Area (VTI):     1.39 cm      RVOT Peak grad: 3 mmHg AV Vmax:           222.62 cm/s AV Vmean:          184.800 cm/s AV VTI:            0.518 m AV Peak Grad:      19.8 mmHg AV Mean Grad:      15.8 mmHg LVOT Vmax:         93.40 cm/s LVOT Vmean:        61.200 cm/s LVOT VTI:          0.190 m LVOT/AV VTI ratio: 0.37  AORTA Ao Root diam: 3.10 cm Ao Asc diam:  3.30 cm MITRAL VALVE               TRICUSPID VALVE MV Area (PHT): 6.22 cm    TR Peak grad:   10.5 mmHg MV Decel Time: 122 msec    TR Vmax:        162.00 cm/s MV E velocity: 88.60 cm/s                            SHUNTS                            Systemic VTI:  0.19 m                            Systemic Diam: 2.20 cm Denyse Bathe Electronically signed by Denyse Bathe Signature Date/Time: 12/17/2023/1:12:32 PM    Final       Time coordinating discharge: over 30 minutes  SIGNED:  Laneta Blunt DO [facility]

## 2023-12-20 NOTE — Progress Notes (Cosign Needed Addendum)
 [facility] CLINIC CARDIOLOGY PROGRESS NOTE       Patient ID: Jerry Mcpherson MRN: [id number] DOB/AGE: 06-28-1940 83 y.o.  Admit date: 12/16/2023 Referring Physician Dr. Marsa Primary Physician Gasper, Nancyann BRAVO, MD Primary Cardiologist [facility] [place] Ao, NP) Reason for Consultation New CHF/ Syncope  HPI: Jerry Mcpherson is a 83 y.o. adult  with a past medical history of retention, recurrent vertigo, polycythemia vera, history of DVTs, hypothyroidism, paroxysmal atrial fibrillation/flutter, history of bladder cancer, PAD who presented to the ED on 12/16/2023 for multiple episodes of syncope. Patient states they occur while sitting and has tachycardia with lightheadedness prior to syncopal episode. Cardiology was consulted for further evaluation.   Interval History: -Patient seen and examined this AM and laying comfortably in hospital bed, nearly flat. Patient states he feels fine and denies any more recurrence of syncope or SOB. States palpitations are much improved, hasn't has palpitations sine yesterday AM s/p PO amio.  -Patients BP borderline and HR stable this AM. Per tele SR with PACs, rate 70s. Less frequent episodes of AF/AFL RVR. -Patient remains on room air with stable SpO2.  -Patient eager to go home, recommend ambulation.    Review of systems complete and found to be negative unless listed above    Past Medical History:  Diagnosis Date   Acute deep vein thrombosis (DVT) of right femoral vein (HCC) 10/28/2020   US  10/24/2020 [facility] Acute right lower extremity DVT extending from the common femoral vein to  the popliteal vein.     Bladder cancer (HCC)    Genital herpes    Hyperglycemia    Hypertension    Squamous cell carcinoma     Past Surgical History:  Procedure Laterality Date   CATARACT EXTRACTION Left 10/2012   Dr. Lethea; [facility]   CYSTECTOMY W/ URETEROILEAL CONDUIT  07/24/2020   [facility] for Stage IV bladder cancer extending to  prostate   EYE SURGERY Left    removed a piece of steel   FEMORAL ARTERY STENT Left 06/16/2022   SFA stenting at [facility]   HERNIA REPAIR  12/08/2006   umbilical hernia, incarderated; Dr. Dessa   PROSTATECTOMY  4/14/2-22   [facility] for Stage IV bladder cancer extending to prostate   ROTATOR CUFF REPAIR     SQUAMOUS CELL CARCINOMA EXCISION  2008   TONSILLECTOMY      Medications Prior to Admission  Medication Sig Dispense Refill Last Dose/Taking   clopidogrel  (PLAVIX ) 75 MG tablet Take 75 mg by mouth daily.   Unknown   diclofenac  Sodium (VOLTAREN ) 1 % GEL Apply 4 g topically 4 (four) times daily as needed. 100 g 5 Unknown   ELIQUIS  5 MG TABS tablet TAKE 1 TABLET BY MOUTH TWICE A DAY 180 tablet 3 Unknown   ferrous sulfate  325 (65 FE) MG tablet Take 325 mg by mouth daily.   Unknown   furosemide  (LASIX ) 20 MG tablet Take 2 tablets by mouth daily.   Unknown   hydrochlorothiazide  (HYDRODIURIL ) 25 MG tablet TAKE 1 TABLET BY MOUTH EVERY DAY 90 tablet 1 Unknown   hydroxyurea  (HYDREA ) 500 MG capsule Take 500 mg by mouth as directed. 2 tablets 3 days a week, and 1 tablet all other days   Unknown   metoprolol  succinate (TOPROL -XL) 50 MG 24 hr tablet TAKE 1 TABLET BY MOUTH EVERY DAY (Patient taking differently: Take 50 mg by mouth 2 (two) times daily.) 90 tablet 3 Unknown   Multiple Vitamins-Minerals (MULTIVITAMIN ADULT PO) Take 1 tablet by mouth  daily.   Unknown   oxyCODONE -acetaminophen  (PERCOCET) 10-325 MG tablet Take 1 tablet by mouth every 6 (six) hours as needed. for pain 120 tablet 0 Unknown   valACYclovir  (VALTREX ) 1000 MG tablet TAKE 1 TABLET BY MOUTH TWICE A DAY AS NEEDED 15 tablet 4 Unknown   docusate sodium  (COLACE) 100 MG capsule Take 1 capsule by mouth daily as needed.      levothyroxine  (SYNTHROID ) 75 MCG tablet TAKE 1 TABLET BY MOUTH EVERY DAY 90 tablet 1    Social History   Socioeconomic History   Marital status: Divorced    Spouse name: Not on file   Number of children: 1   Years of  education: Not on file   Highest education level: Some college, no degree  Occupational History   Occupation: Retired   Occupation: Sales executive part time  Tobacco Use   Smoking status: Former    Current packs/day: 0.00    Average packs/day: 3.0 packs/day for 15.0 years (45.0 ttl pk-yrs)    Types: Cigarettes    Start date: 04/12/1953    Quit date: 04/12/1968    Years since quitting: 55.7   Smokeless tobacco: Never  Vaping Use   Vaping status: Never Used  Substance and Sexual Activity   Alcohol use: No    Alcohol/week: 0.0 standard drinks of alcohol   Drug use: No   Sexual activity: Not on file  Other Topics Concern   Not on file  Social History Narrative   Not on file   Social Drivers of Health   Financial Resource Strain: Low Risk  (09/16/2023)   Received from [facility]   Overall Financial Resource Strain (CARDIA)    Difficulty of Paying Living Expenses: Not hard at all  Recent Concern: Financial Resource Strain - Medium Risk (08/10/2023)   Overall Financial Resource Strain (CARDIA)    Difficulty of Paying Living Expenses: Somewhat hard  Food Insecurity: No Food Insecurity (12/17/2023)   Hunger Vital Sign    Worried About Running Out of Food in the Last Year: Never true    Ran Out of Food in the Last Year: Never true  Transportation Needs: No Transportation Needs (12/17/2023)   PRAPARE - Administrator, Civil Service (Medical): No    Lack of Transportation (Non-Medical): No  Physical Activity: Inactive (08/10/2023)   Exercise Vital Sign    Days of Exercise per Week: 0 days    Minutes of Exercise per Session: 0 min  Stress: No Stress Concern Present (08/10/2023)   [employer] of Occupational Health - Occupational Stress Questionnaire    Feeling of Stress : Only a little  Social Connections: Socially Isolated (12/17/2023)   Social Connection and Isolation Panel    Frequency of Communication with Friends and Family: More than three times a  week    Frequency of Social Gatherings with Friends and Family: More than three times a week    Attends Religious Services: Never    Database administrator or Organizations: No    Attends Banker Meetings: Never    Marital Status: Divorced  Catering manager Violence: Not At Risk (12/17/2023)   Humiliation, Afraid, Rape, and Kick questionnaire    Fear of Current or Ex-Partner: No    Emotionally Abused: No    Physically Abused: No    Sexually Abused: No    Family History  Problem Relation Age of Onset   Hypertension Mother    Heart attack Mother    Stroke  Father    Cancer Father    COPD Sister    Congestive Heart Failure Brother      Vitals:   12/19/23 2040 12/20/23 0500 12/20/23 0500 12/20/23 0739  BP: 107/65  102/70 109/75  Pulse: 81  76 76  Resp: 18  18 17   Temp: 97.9 F (36.6 C)  98.5 F (36.9 C) 97.9 F (36.6 C)  TempSrc: Oral   Oral  SpO2: 97%  96% 94%  Weight:  59.9 kg 65 kg   Height:        PHYSICAL EXAM General: Well appearing elderly male, well nourished, in no acute distress. HEENT: Normocephalic and atraumatic. Neck: No JVD.   Lungs: Normal respiratory effort on room air. Clear bilaterally to auscultation. No wheezes, crackles, rhonchi.  Heart: HRRR. Normal S1 and S2 without gallops or murmurs.  Abdomen: Non-distended appearing.  Msk: Normal strength and tone for age. Extremities: Warm and well perfused. No clubbing, cyanosis, edema.  Neuro: Alert and oriented X 3. Psych: Answers questions appropriately.   Labs: Basic Metabolic Panel: Recent Labs    12/18/23 0443 12/20/23 0545  NA 138 140  K 4.0 4.4  CL 112* 112*  CO2 16* 20*  GLUCOSE 100* 109*  BUN 44* 54*  CREATININE 1.86* 1.74*  CALCIUM  8.3* 8.7*  MG  --  2.6*   Liver Function Tests: No results for input(s): AST, ALT, ALKPHOS, BILITOT, PROT, ALBUMIN in the last 72 hours.  No results for input(s): LIPASE, AMYLASE in the last 72 hours. CBC: Recent Labs     12/18/23 0443 12/20/23 0545  WBC 19.9* 21.1*  NEUTROABS 15.9*  --   HGB 13.9 14.1  HCT 43.8 45.8  MCV 86.7 88.9  PLT 541* 558*   Cardiac Enzymes: No results for input(s): CKTOTAL, CKMB, CKMBINDEX, TROPONINIHS in the last 72 hours.  BNP: No results for input(s): BNP in the last 72 hours. D-Dimer: No results for input(s): DDIMER in the last 72 hours. Hemoglobin A1C: No results for input(s): HGBA1C in the last 72 hours. Fasting Lipid Panel: No results for input(s): CHOL, HDL, LDLCALC, TRIG, CHOLHDL, LDLDIRECT in the last 72 hours. Thyroid  Function Tests: No results for input(s): TSH, T4TOTAL, T3FREE, THYROIDAB in the last 72 hours.  Invalid input(s): FREET3  Anemia Panel: No results for input(s): VITAMINB12, FOLATE, FERRITIN, TIBC, IRON, RETICCTPCT in the last 72 hours.   Radiology: ECHOCARDIOGRAM COMPLETE Result Date: 12/17/2023    ECHOCARDIOGRAM REPORT   Patient Name:   DIMETRI ARMITAGE Schurman Date of Exam: 12/17/2023 Medical Rec #:  [id number]           Height:       70.0 in Accession #:    [id number]          Weight:       135.0 lb Date of Birth:  01/19/1941           BSA:          1.766 m Patient Age:    83 years            BP:           122/73 mmHg Patient Gender: M                   HR:           95 bpm. Exam Location:  [facility] Procedure: 2D Echo, Cardiac Doppler and Color Doppler (Both Spectral and Color            Flow  Doppler were utilized during procedure). Indications:     Syncope R55  History:         Patient has no prior history of Echocardiogram examinations.  Sonographer:     Thedora Louder RDCS, FASE Referring Phys:  [id number] EMERY LITTIE FUSS Diagnosing Phys: Denyse Bathe  Sonographer Comments: Technically difficult study due to poor echo windows and suboptimal parasternal window. Image acquisition challenging due to respiratory motion. This study was performed with patient sitting up. IMPRESSIONS  1. Left ventricular ejection fraction,  by estimation, is 35 to 40%. The left ventricle has moderate to severely decreased function. The left ventricle demonstrates global hypokinesis. The left ventricular internal cavity size was severely dilated. There  is mild concentric left ventricular hypertrophy. Left ventricular diastolic parameters are consistent with Grade III diastolic dysfunction (restrictive).  2. Right ventricular systolic function is moderately reduced. The right ventricular size is moderately enlarged. Mildly increased right ventricular wall thickness.  3. Left atrial size was severely dilated.  4. Right atrial size was severely dilated.  5. The mitral valve is abnormal. Mild to moderate mitral valve regurgitation.  6. The aortic valve is calcified. Aortic valve regurgitation is trivial. Moderate to severe aortic valve stenosis. Conclusion(s)/Recommendation(s): Findings consistent with severe valvular heart disease. FINDINGS  Left Ventricle: Left ventricular ejection fraction, by estimation, is 35 to 40%. The left ventricle has moderate to severely decreased function. The left ventricle demonstrates global hypokinesis. Strain was performed and the global longitudinal strain is indeterminate. The left ventricular internal cavity size was severely dilated. There is mild concentric left ventricular hypertrophy. Left ventricular diastolic parameters are consistent with Grade III diastolic dysfunction (restrictive). Right Ventricle: The right ventricular size is moderately enlarged. Mildly increased right ventricular wall thickness. Right ventricular systolic function is moderately reduced. Left Atrium: Left atrial size was severely dilated. Right Atrium: Right atrial size was severely dilated. Pericardium: There is no evidence of pericardial effusion. Mitral Valve: The mitral valve is abnormal. Mild to moderate mitral valve regurgitation. Tricuspid Valve: The tricuspid valve is grossly normal. Tricuspid valve regurgitation is trivial. Aortic  Valve: Due to cardiomyopathy may have low pressure gradiant and may have severe aortic stenosis. The aortic valve is calcified. Aortic valve regurgitation is trivial. Moderate to severe aortic stenosis is present. Aortic valve mean gradient measures 15.8 mmHg. Aortic valve peak gradient measures 19.8 mmHg. Aortic valve area, by VTI measures 1.39 cm. Pulmonic Valve: The pulmonic valve was grossly normal. Pulmonic valve regurgitation is trivial. Aorta: The aortic root, ascending aorta and aortic arch are all structurally normal, with no evidence of dilitation or obstruction. IAS/Shunts: No atrial level shunt detected by color flow Doppler. Additional Comments: 3D was performed not requiring image post processing on an independent workstation and was indeterminate.  LEFT VENTRICLE PLAX 2D LVIDd:         5.50 cm      Diastology LVIDs:         4.50 cm      LV e' medial:    4.57 cm/s LV PW:         1.30 cm      LV E/e' medial:  19.4 LV IVS:        1.30 cm      LV e' lateral:   3.37 cm/s LVOT diam:     2.20 cm      LV E/e' lateral: 26.3 LV SV:         72 LV SV Index:   41 LVOT Area:  3.80 cm  LV Volumes (MOD) LV vol d, MOD A2C: 135.0 ml LV vol d, MOD A4C: 146.0 ml LV vol s, MOD A2C: 65.1 ml LV vol s, MOD A4C: 88.3 ml LV SV MOD A2C:     69.9 ml LV SV MOD A4C:     146.0 ml LV SV MOD BP:      65.8 ml RIGHT VENTRICLE RV Basal diam:  3.50 cm RV S prime:     22.00 cm/s TAPSE (M-mode): 3.5 cm LEFT ATRIUM             Index        RIGHT ATRIUM           Index LA diam:        3.90 cm 2.21 cm/m   RA Area:     14.80 cm LA Vol (A2C):   60.3 ml 34.14 ml/m  RA Volume:   36.10 ml  20.44 ml/m LA Vol (A4C):   39.2 ml 22.19 ml/m LA Biplane Vol: 49.4 ml 27.97 ml/m  AORTIC VALVE                     PULMONIC VALVE AV Area (Vmax):    1.59 cm      PV Vmax:        0.96 m/s AV Area (Vmean):   1.26 cm      PV Peak grad:   3.7 mmHg AV Area (VTI):     1.39 cm      RVOT Peak grad: 3 mmHg AV Vmax:           222.62 cm/s AV Vmean:           184.800 cm/s AV VTI:            0.518 m AV Peak Grad:      19.8 mmHg AV Mean Grad:      15.8 mmHg LVOT Vmax:         93.40 cm/s LVOT Vmean:        61.200 cm/s LVOT VTI:          0.190 m LVOT/AV VTI ratio: 0.37  AORTA Ao Root diam: 3.10 cm Ao Asc diam:  3.30 cm MITRAL VALVE               TRICUSPID VALVE MV Area (PHT): 6.22 cm    TR Peak grad:   10.5 mmHg MV Decel Time: 122 msec    TR Vmax:        162.00 cm/s MV E velocity: 88.60 cm/s                            SHUNTS                            Systemic VTI:  0.19 m                            Systemic Diam: 2.20 cm Denyse Bathe Electronically signed by Denyse Bathe Signature Date/Time: 12/17/2023/1:12:32 PM    Final    MR BRAIN WO CONTRAST Result Date: 12/16/2023 CLINICAL DATA:  Syncope/presyncope, cerebrovascular cause suspected EXAM: MRI HEAD WITHOUT CONTRAST TECHNIQUE: Multiplanar, multiecho pulse sequences of the brain and surrounding structures were obtained without intravenous contrast. COMPARISON:  MRI head 03/24/2018. FINDINGS: Brain: No acute infarction, acute hemorrhage, hydrocephalus, extra-axial collection or mass  lesion. Small remote right cerebellar infarct. Small remote left corona radiata lacunar infarct and mild for age chronic microvascular ischemic disease. Focus susceptibility artifact in the left thalamus, compatible with prior hemorrhage. Cerebral atrophy. Vascular: Major arterial flow voids are maintained at the skull base. Skull and upper cervical spine: Normal marrow signal. Sinuses/Orbits: Severe bilateral maxillary sinus mucosal thickening. No acute orbital findings. IMPRESSION: 1. No evidence of acute intracranial abnormality. 2. Small remote lacunar infarcts and prior left thalamic hemorrhage. 3. Severe bilateral maxillary sinus mucosal thickening. Electronically Signed   By: Gilmore GORMAN Molt M.D.   On: 12/16/2023 20:36    ECHO as above.  TELEMETRY reviewed by me 12/20/2023: SR, PACs 70s  EKG reviewed by me: Multifocal atrial  tachycardia, rate 105 bpm with nonspecific ST-T wave changes  Data reviewed by me 12/20/2023: last 24h vitals tele labs imaging I/O's hospitalist progress notes.  Principal Problem:   Syncope and collapse Active Problems:   Primary hypertension   Vertigo   Polycythemia vera (HCC)   Hypothyroidism   Atrial flutter (HCC)   Malignant neoplasm of overlapping sites of bladder (HCC)   PAD (peripheral artery disease) (HCC)   UTI (urinary tract infection)   Syncope    ASSESSMENT AND PLAN:  Jerry Mcpherson is a 83 y.o. adult  with a past medical history of retention, recurrent vertigo, polycythemia vera, history of DVTs, hypothyroidism, paroxysmal atrial fibrillation/flutter, history of bladder cancer, PAD who presented to the ED on 12/16/2023 for multiple episodes of syncope. Patient states they occur while sitting and has tachycardia with lightheadedness prior to syncopal episode. Cardiology was consulted for further evaluation.   # New HFrEF (EF 35-40%) # Hypertension Echo this admission reveals newly reduced EF 35-40%, no wall hypokinesis, mild concentric LVH, grade 3 diastolic dysfunction. Appears euvolemic on exam. -Discontinue enalapril  2.5 mg daily due to hx angioedema allergy. (Of note, had documented allergy to losartan causing angioedema). -Holding dapagliflozin  10 mg daily due to leukocytosis and current UTI. Consider resuming at outpatient follow-up when WBC improves.  -Will plan to resume home Lasix  20 mg prior to discharge or when patient appears volume up. -Metoprolol  as stated below. -Will plan to initiate spironolactone when renal function stabilizes. Can be started at outpatient follow-up. -Recommend outpatient ischemic evaluation.  # Moderate to severe aortic stenosis Echo from 05/2023 reveals moderate paradoxical low-flow aortic stenosis.  Echo this admission revealed moderate to severe aortic stenosis. -Recommend outpatient cardiology follow-up to discussed possible  benefit from TAVR.  # Syncope Syncopal episodes may be related to severe AS associated with low flow state due to cardiomyopathy. -Discussed with patient multiple times that he cannot drive for at least 6 months/until syncope work-up is complete. Syncopal episodes occur at rest and is very dangerous if patient continuous to drive. Initially, patient was very irritable when discussing this and states he drives himself places only. Asked if family member can help driving in the meantime and patient gets very upset. Repeat discussion today on 09/09, patient is more understanding and states his nephew can drive him and he will not drive until cleared by a provider.  -Plan to place a cardiac monitor on discharge for further evaluation of syncope. Results can be viewed under [facility] system for his primary cardiologist.   # Paroxysmal atrial fibrillation/flutter # Hx CVA Per tele having multiple episodes of AF/AFL RVR, much improved.  -Monitor and replenish electrolytes for a goal K >4, Mag >2  -Continue home Eliquis  2.5 mg twice daily for stroke risk  reduction. -Continue PO amio 400 mg BID for 10 days, then 200 mg daily for rhythm control. -BP borderline. Plan to resume home metoprolol  succinate 12.5 mg when BP.  Patient has follow-up with primary cardiologist [place] Ao, NP at [facility]) on 09/17 at 9 AM.  This patient's plan of care was discussed and created with Dr. Ammon and he is in agreement.  Signed: Dorene Comfort, PA-C  12/20/2023, 9:59 AM [facility]

## 2023-12-20 NOTE — Progress Notes (Signed)
 Heart Failure Stewardship Pharmacy Note  PCP: [name], MD PCP-Cardiologist: None  HPI: Jerry Mcpherson is a 83 y.o. adult with essential hypertension, recurrent vertigo, polycythemia vera, hypothyroidism, history of DVTs, atrial fibrillation and flutter, history of bladder cancer peripheral arterial disease, UTI who presented with syncopal episodes. On admission, HS-troponin was 17, and TSH 8. Recent BNP on 12/09/23 1624.8.   Pertinent cardiac history: Normal LVEF on TTE 03/2020, 10/2020, and 05/2023. Normal stress test in 06/2020. TTE this admission noted newly reduced LVEF of 35-40% with G3DD,   Pertinent Lab Values: Creatinine, Ser  Date Value Ref Range Status  12/20/2023 1.74 (H) 0.61 - 1.24 mg/dL Final   BUN  Date Value Ref Range Status  12/20/2023 54 (H) 8 - 23 mg/dL Final  91/70/7974 30 (H) 8 - 27 mg/dL Final   Potassium  Date Value Ref Range Status  12/20/2023 4.4 3.5 - 5.1 mmol/L Final   Sodium  Date Value Ref Range Status  12/20/2023 140 135 - 145 mmol/L Final  12/09/2023 140 134 - 144 mmol/L Final   BNP  Date Value Ref Range Status  12/09/2023 1,624.8 (H) 0.0 - 100.0 pg/mL Final    Comment:    [employer] ADVIA Centaur XP methodology   Magnesium  Date Value Ref Range Status  12/20/2023 2.6 (H) 1.7 - 2.4 mg/dL Final    Comment:    Performed at [facility], [address] [place], [place] [address]   Hemoglobin A1C  Date Value Ref Range Status  08/18/2020 5.3 4.0 - 5.6 % Final   TSH  Date Value Ref Range Status  12/16/2023 8.000 (H) 0.350 - 4.500 uIU/mL Final    Comment:    Performed by a 3rd Generation assay with a functional sensitivity of <=0.01 uIU/mL. Performed at [facility], [address] [place], [place] [address]   07/27/2021 4.980 (H) 0.450 - 4.500 uIU/mL Final    Vital Signs:  Temp:  [97.6 F (36.4 C)-98.5 F (36.9 C)] 97.9 F (36.6 C) (09/09 0739) Pulse Rate:  [76-99] 76 (09/09 0739) Cardiac Rhythm:  Normal sinus rhythm (09/08 1900) Resp:  [16-18] 17 (09/09 0739) BP: (94-109)/(61-75) 109/75 (09/09 0739) SpO2:  [94 %-99 %] 94 % (09/09 0739) Weight:  [59.9 kg (132 lb 0.9 oz)-65 kg (143 lb 4.8 oz)] 65 kg (143 lb 4.8 oz) (09/09 0500)  Intake/Output Summary (Last 24 hours) at 12/20/2023 1048 Last data filed at 12/19/2023 2055 Gross per 24 hour  Intake --  Output 725 ml  Net -725 ml    Current Heart Failure Medications:  Loop diuretic: none Beta-Blocker: none ACEI/ARB/ARNI: enalapril  MRA: none SGLT2i: Farxiga  10 mg daily Other:  Prior to admission Heart Failure Medications:  Loop diuretic: furosemide  40 mg daily Beta-Blocker: metoprolol  succinate 25 mg daily ACEI/ARB/ARNI: none MRA: none SGLT2i: none Other: hydrochlorothiazide  25 mg daily  Assessment: 1. Acute systolic heart failure (LVEF 35-40%) with G3DD, due to unknown etiology. NYHA class II symptoms.  With moderate to severe aortic stenosis, G3DD, neuropathy, and history of biceps tendon partial tear, can consider work-up for amyloid outpatient. -Symptoms: Reports feeling well overall. Denies symptoms at this time. Appetite is okay. -Volume: Appears euvolemic. Syncopal on admission, likely from UTI plus hypovolemia. Home furosemide  40 mg daily held. Would continue to hold at this time, may need to be changed to prn.  -Hemodynamics: BP is normal. HR normal.  -BB: Consider adding metoprolol  succinate 12.5 mg daily tomorrow.  -ACEI/ARB/ARNI: Contraindicated with history of angioedema to  losartan. -MRA: Not a candidate for MRA at this time due to renal function.  -SGLT2i: Previously on Farxiga  10 mg daily, held given current UTI with elevated white count. High risk for recurrence with bladder history.   Plan: 1) Medication changes recommended at this time: -Consider adding metoprolol  succinate 12.5 mg daily.  2) Patient assistance: -Pending  3) Education: - Patient has been educated on current HF medications and potential  additions to HF medication regimen - Patient verbalizes understanding that over the next few months, these medication doses may change and more medications may be added to optimize HF regimen - Patient has been educated on basic disease state pathophysiology and goals of therapy  Please do not hesitate to reach out with questions or concerns,  Marayah Higdon, PharmD, CPP, BCPS, [facility] Heart Failure Pharmacist  Phone - [phone] 12/20/2023 10:48 AM

## 2023-12-21 ENCOUNTER — Ambulatory Visit: Admitting: Family Medicine

## 2023-12-21 ENCOUNTER — Telehealth: Payer: Self-pay

## 2023-12-21 DIAGNOSIS — E039 Hypothyroidism, unspecified: Secondary | ICD-10-CM | POA: Diagnosis not present

## 2023-12-21 DIAGNOSIS — N1832 Chronic kidney disease, stage 3b: Secondary | ICD-10-CM | POA: Diagnosis not present

## 2023-12-21 DIAGNOSIS — E785 Hyperlipidemia, unspecified: Secondary | ICD-10-CM | POA: Diagnosis not present

## 2023-12-21 DIAGNOSIS — R55 Syncope and collapse: Secondary | ICD-10-CM

## 2023-12-21 DIAGNOSIS — I4892 Unspecified atrial flutter: Secondary | ICD-10-CM | POA: Diagnosis not present

## 2023-12-21 DIAGNOSIS — I739 Peripheral vascular disease, unspecified: Secondary | ICD-10-CM | POA: Diagnosis not present

## 2023-12-21 DIAGNOSIS — I872 Venous insufficiency (chronic) (peripheral): Secondary | ICD-10-CM | POA: Diagnosis not present

## 2023-12-21 DIAGNOSIS — I1310 Hypertensive heart and chronic kidney disease without heart failure, with stage 1 through stage 4 chronic kidney disease, or unspecified chronic kidney disease: Secondary | ICD-10-CM | POA: Diagnosis not present

## 2023-12-21 DIAGNOSIS — L03116 Cellulitis of left lower limb: Secondary | ICD-10-CM | POA: Diagnosis not present

## 2023-12-21 DIAGNOSIS — D45 Polycythemia vera: Secondary | ICD-10-CM | POA: Diagnosis not present

## 2023-12-21 NOTE — Transitions of Care (Post Inpatient/ED Visit) (Signed)
 12/21/2023  Name: Jerry Mcpherson MRN: [id number] DOB: 09-13-1940  Today's TOC FU Call Status: Today's TOC FU Call Status:: Successful TOC FU Call Completed TOC FU Call Complete Date: 12/21/23 Patient's Name and Date of Birth confirmed.  Transition Care Management Follow-up Telephone Call Date of Discharge: 12/20/23 Discharge Facility: [facility]) Type of Discharge: Inpatient Admission Primary Inpatient Discharge Diagnosis:: Syncope and collapse How have you been since you were released from [facility]?: Better Any questions or concerns?: No  Items Reviewed: Did you receive and understand Jerry discharge instructions provided?: Yes Medications obtained,verified, and reconciled?: Yes (Medications Reviewed) Any new allergies since your discharge?: No Dietary orders reviewed?: Yes Type of Diet Ordered:: Heart healthy low sodium Do you have support at home?: Yes People in Home [RPT]: alone Name of Support/Comfort Primary Source: [name] (Nobody is to call him at work, I want to make that clear, that if he needs to be called about me it needs to be when he gets off of work around 5:15 pm)  Medications Reviewed Today: Medications Reviewed Today     Reviewed by Jerry Richerd GRADE, RN (Registered Nurse) on 12/21/23 at 1228  Med List Status: <None>   Medication Order Taking? Sig Documenting Provider Last Dose Status Informant  amiodarone  (PACERONE ) 200 MG tablet [id number] Yes Take 2 tablets (400 mg total) by mouth 2 (two) times daily for 10 days, THEN 1 tablet (200 mg total) daily. Alexander, Natalie, DO  Active   apixaban  (ELIQUIS ) 2.5 MG TABS tablet [id number] Yes Take 1 tablet (2.5 mg total) by mouth 2 (two) times daily. Alexander, Natalie, DO  Active   ciprofloxacin  (CIPRO ) 500 MG tablet [id number] Yes Take 1 tablet (500 mg total) by mouth daily with breakfast. Jerry Edelman, DO  Active   clopidogrel  (PLAVIX ) 75 MG tablet [id number] Yes  Take 75 mg by mouth daily. [provider]  Active Self, Other  diclofenac  Sodium (VOLTAREN ) 1 % GEL [id number]  Apply 4 g topically 4 (four) times daily as needed. Jerry Nancyann BRAVO, MD  Active Other  docusate sodium  (COLACE) 100 MG capsule [id number]  Take 1 capsule by mouth daily as needed. [provider]  Active Other           Med Note MARVIS LELA Mcpherson   Fri Feb 02, 2019 10:59 AM)    ferrous sulfate  325 (65 FE) MG tablet [id number] Yes Take 325 mg by mouth daily. [provider]  Active Other  furosemide  (LASIX ) 20 MG tablet [id number]  Take 2 tablets (40 mg total) by mouth daily as needed for fluid or edema. Alexander, Natalie, DO  Active   hydroxyurea  (HYDREA ) 500 MG capsule [id number] Yes Take 500 mg by mouth as directed. 2 tablets 3 days a week, and 1 tablet all other days [provider]  Active Other  levothyroxine  (SYNTHROID ) 75 MCG tablet [id number] Yes TAKE 1 TABLET BY MOUTH EVERY DAY Jerry Nancyann BRAVO, MD  Active Other  Multiple Vitamins-Minerals (MULTIVITAMIN ADULT PO) [id number] Yes Take 1 tablet by mouth daily. [provider]  Active Other           Med Note MARVIS LELA Mcpherson   Fri Feb 02, 2019 10:59 AM)    oxyCODONE -acetaminophen  (PERCOCET) 10-325 MG tablet [id number]  Take 1 tablet by mouth every 6 (six) hours as needed. for pain Jerry Nancyann BRAVO, MD  Active Other  valACYclovir  (VALTREX ) 1000 MG tablet [id number]  TAKE 1 TABLET BY MOUTH TWICE A  DAY AS NEEDED Jerry Nancyann BRAVO, MD  Active Other            Home Care and Equipment/Supplies: Were Home Health Services Ordered?: Yes (Home Care wasn't ordered but Jerry patient was active prior to admission for wound care twice a week and nurse is coming out today he states.) Name of Home Health Agency:: [facility] (Noted in previous documents for wound care) Has Agency set up a time to come to your home?: Yes First Home Health Visit Date: 12/21/23 Any new equipment or medical supplies  ordered?: No  Functional Questionnaire: Do you need assistance with bathing/showering or dressing?: No Do you need assistance with meal preparation?: No Do you need assistance with eating?: No Do you have difficulty maintaining continence: No Do you need assistance with getting out of bed/getting out of a chair/moving?: No  Follow up appointments reviewed: PCP Follow-up appointment confirmed?: Yes Date of PCP follow-up appointment?: 12/28/23 Follow-up Provider: Dr. Gasper Specialist [place] [facility] Follow-up appointment confirmed?:  (I had to cancel because my car is not working and lost keys at [facility]) Do you need transportation to your follow-up appointment?: Yes Transportation Need Intervention Addressed By:: AMB Referral For SDOH Needs Do you understand care options if your condition(s) worsen?: Yes-patient verbalized understanding  SDOH Interventions Today    Flowsheet Row Most Recent Value  SDOH Interventions   Food Insecurity Interventions Intervention Not Indicated  Housing Interventions Intervention Not Indicated  Transportation Interventions AMB Referral  Utilities Interventions Intervention Not Indicated    Goals Addressed             This Visit's Progress    VBCI Transitions of Care (TOC) Care Plan       Problems:  Recent Hospitalization for treatment of syncope and collapse, UTI, Proxysmal Atrial Fib Knowledge Deficit Related to disease state and SDOH barrier: Transportation needs due to car trouble and lost car keys at [facility].  Goal:  Over Jerry next 30 days, Jerry patient will not experience Mcpherson readmission  Interventions:  Transitions of Care: Community Resource Referral Made to address Transportation Doctor Visits  - discussed Jerry importance of doctor visits Contacted provider for patient needs post Mcpherson transportation for Mcpherson follow up  Post discharge activity limitations prescribed by provider reviewed patient needs a  scale  Patient Self Care Activities:  Attend all scheduled provider appointments Call pharmacy for medication refills 3-7 days in advance of running out of medications Call provider office for new concerns or questions  Notify RN Care Manager of TOC call rescheduling needs Participate in Transition of Care Program/Attend TOC scheduled calls Take medications as prescribed    Plan:  Follow up with provider re: has PCP follow up and now with transportation needs, SDOH referral made to [facility] team regarding insurance assistance availability  Jerry patient has been provided with contact information for Jerry care management team and has been advised to call with any health related questions or concerns.  Contact Jerry [facility] unit where patient's belongings were, also contact [facility] security to check lost and found, gave them patient information to contact patient.      '  Richerd Fish, RN, BSN, CCM [place]  [facility], [facility] Health RN Care Manager Direct Dial: [phone]

## 2023-12-21 NOTE — Patient Instructions (Addendum)
 Visit Information  Thank you for taking time to visit with me today. Please don't hesitate to contact me if I can be of assistance to you before our next scheduled telephone appointment.  Our next appointment is by telephone on 12/29/23  at 1300  Following is a copy of your care plan:   Goals Addressed             This Visit's Progress    VBCI Transitions of Care (TOC) Care Plan       Problems:  Recent Hospitalization for treatment of syncope and collapse, UTI, Proxysmal Atrial Fib Knowledge Deficit Related to disease state and SDOH barrier: Transportation needs due to car trouble and lost car keys at [facility].  Goal:  Over the next 30 days, the patient will not experience hospital readmission  Interventions:  Transitions of Care: Community Resource Referral Made to address Transportation Doctor Visits  - discussed the importance of doctor visits Contacted provider for patient needs post hospital transportation for hospital follow up  Post discharge activity limitations prescribed by provider reviewed patient needs a scale  Patient Self Care Activities:  Attend all scheduled provider appointments Call pharmacy for medication refills 3-7 days in advance of running out of medications Call provider office for new concerns or questions  Notify RN Care Manager of TOC call rescheduling needs Participate in Transition of Care Program/Attend TOC scheduled calls Take medications as prescribed    Plan:  Follow up with provider re: has PCP follow up and now with transportation needs, SDOH referral made to [facility] team regarding insurance assistance availability  The patient has been provided with contact information for the care management team and has been advised to call with any health related questions or concerns.  Contact the hospital unit where patient's belongings were, also contact [facility] hospital security to check lost and found, gave them patient information to  contact patient.        Patient verbalizes understanding of instructions and care plan provided today and agrees to view in MyChart. Active MyChart status and patient understanding of how to access instructions and care plan via MyChart confirmed with patient.     Telephone follow up appointment with care management team member scheduled for: The patient has been provided with contact information for the care management team and has been advised to call with any health related questions or concerns.  The care management team will reach out to the patient again over the next 5- 10 business days.  Follow up with provider re: symptoms of syncope  Please call the care guide team at [phone] if you need to cancel or reschedule your appointment.   Please call the Suicide and Crisis Lifeline: 988 call the [place]  National Suicide Prevention Lifeline: 973-216-8311 or TTY: (774)717-3018 TTY 256-574-3608) to talk to a trained counselor call 1-800-273-TALK (toll free, 24 hour hotline) call 911 if you are experiencing a Mental Health or Behavioral Health Crisis or need someone to talk to.  Richerd Fish, RN, BSN, CCM [facility], [facility] Health RN Care Manager Direct Dial: [phone]

## 2023-12-21 NOTE — Progress Notes (Signed)
 This encounter was created in error - please disregard.

## 2023-12-22 ENCOUNTER — Other Ambulatory Visit: Payer: Self-pay

## 2023-12-22 ENCOUNTER — Telehealth: Payer: Self-pay

## 2023-12-22 DIAGNOSIS — I872 Venous insufficiency (chronic) (peripheral): Secondary | ICD-10-CM | POA: Diagnosis not present

## 2023-12-22 DIAGNOSIS — E039 Hypothyroidism, unspecified: Secondary | ICD-10-CM | POA: Diagnosis not present

## 2023-12-22 DIAGNOSIS — L03116 Cellulitis of left lower limb: Secondary | ICD-10-CM | POA: Diagnosis not present

## 2023-12-22 DIAGNOSIS — D45 Polycythemia vera: Secondary | ICD-10-CM | POA: Diagnosis not present

## 2023-12-22 DIAGNOSIS — E785 Hyperlipidemia, unspecified: Secondary | ICD-10-CM | POA: Diagnosis not present

## 2023-12-22 DIAGNOSIS — I1310 Hypertensive heart and chronic kidney disease without heart failure, with stage 1 through stage 4 chronic kidney disease, or unspecified chronic kidney disease: Secondary | ICD-10-CM | POA: Diagnosis not present

## 2023-12-22 DIAGNOSIS — I4892 Unspecified atrial flutter: Secondary | ICD-10-CM | POA: Diagnosis not present

## 2023-12-22 DIAGNOSIS — N1832 Chronic kidney disease, stage 3b: Secondary | ICD-10-CM | POA: Diagnosis not present

## 2023-12-22 DIAGNOSIS — I739 Peripheral vascular disease, unspecified: Secondary | ICD-10-CM | POA: Diagnosis not present

## 2023-12-22 NOTE — Patient Instructions (Signed)
 Visit Information  Thank you for taking time to visit with me today. Please don't hesitate to contact me if I can be of assistance to you before our next scheduled appointment.  Our next appointment is by telephone on 01/05/24 at 11:30 Please call the care guide team at [phone] if you need to cancel or reschedule your appointment.   Following is a copy of your care plan:   Goals Addressed             This Visit's Progress    BSW VBCI Social Work Care Plan       Problems:   Transportation  CSW Clinical Goal(s):   Over the next 30 days the Patient will will follow up with transportation as directed by Social Work.  Interventions:  SW provided patient and home health nurse Stacy with the telephone number for [employer]. SW placed patient on the waitlist for [employer].  Patient Goals/Self-Care Activities:  Patient and home health nurse will schedule transportation for 9/17 appointment  Plan:   The care management team will reach out to the patient again over the next 7 days.        Please call the Suicide and Crisis Lifeline: 988 call the [place]  National Suicide Prevention Lifeline: 720-282-9890 or TTY: (706) 762-4785 TTY 6571009788) to talk to a trained counselor call 1-800-273-TALK (toll free, 24 hour hotline) call 911 if you are experiencing a Mental Health or Behavioral Health Crisis or need someone to talk to.  Patient verbalizes understanding of instructions and care plan provided today and agrees to view in MyChart. Active MyChart status and patient understanding of how to access instructions and care plan via MyChart confirmed with patient.     Thersia Hoar, HEDWIG, MHA [place]  [facility] Social Worker, Population Health [phone]

## 2023-12-22 NOTE — Patient Outreach (Signed)
 Complex Care Management   Visit Note  12/22/2023  Name:  Jerry Mcpherson MRN: [id number] DOB: 1941-02-20  Situation: Referral received for Complex Care Management related to SDOH Barriers:  Transportation I obtained verbal consent from Patient.  Visit completed with Patient  on the phone  Background:   Past Medical History:  Diagnosis Date   Acute deep vein thrombosis (DVT) of right femoral vein (HCC) 10/28/2020   US  10/24/2020 [facility] Acute right lower extremity DVT extending from the common femoral vein to  the popliteal vein.     Bladder cancer (HCC)    Genital herpes    Hyperglycemia    Hypertension    Squamous cell carcinoma     Assessment: SW completed a telephone outreach with patient, he states he needs transportation to his appointments due to his car not working. SW provided patient and home health nurse Glade the telephone number for [employer]. SW contacted [facility] and placed patient on the waitlist. Waitlist is about 6 months long.  SDOH Interventions    Flowsheet Row Patient Outreach Telephone from 12/22/2023 in [place] POPULATION HEALTH DEPARTMENT Telephone from 12/21/2023 in [place] POPULATION HEALTH DEPARTMENT Clinical Support from 08/10/2023 in [facility] Care Coordination from 11/16/2022 in [facility] Community Care Coordination Clinical Support from 08/09/2022 in [facility] Telephone from 06/21/2022 in [facility] Community Care Coordination  SDOH Interventions        Food Insecurity Interventions -- Intervention Not Indicated Intervention Not Indicated Intervention Not Indicated, Other (Comment)  [Uses SSA Benefits] Intervention Not Indicated Intervention Not Indicated  Housing Interventions -- Intervention Not Indicated Intervention Not Indicated Other (Comment)  [Looking for housing options] Intervention Not Indicated --  Transportation Interventions  Community Resources Provided AMB Referral Intervention Not Indicated --  [Has a vehicle] Intervention Not Indicated Intervention Not Indicated  Utilities Interventions -- Intervention Not Indicated Intervention Not Indicated Intervention Not Indicated  [Homeless] Intervention Not Indicated --  Alcohol Usage Interventions -- -- Intervention Not Indicated (Score <7) -- Intervention Not Indicated (Score <7) --  Depression Interventions/Treatment  -- -- PHQ2-9 Score <4 Follow-up Not Indicated -- -- --  Financial Strain Interventions -- -- Intervention Not Indicated -- Intervention Not Indicated --  Physical Activity Interventions -- -- Patient Declined -- Intervention Not Indicated --  Stress Interventions -- -- Intervention Not Indicated -- Intervention Not Indicated --  Social Connections Interventions -- -- Intervention Not Indicated -- Intervention Not Indicated --  Health Literacy Interventions -- -- Intervention Not Indicated -- -- --    Recommendation:   No recommendations at this time  Follow Up Plan:   Telephone follow-up 01/05/24 at 1130  Thersia Hoar, BSW, [facility]  [facility] Social Worker, Population Health [phone]

## 2023-12-27 ENCOUNTER — Telehealth: Payer: Self-pay

## 2023-12-27 DIAGNOSIS — E785 Hyperlipidemia, unspecified: Secondary | ICD-10-CM | POA: Diagnosis not present

## 2023-12-27 DIAGNOSIS — I4892 Unspecified atrial flutter: Secondary | ICD-10-CM | POA: Diagnosis not present

## 2023-12-27 DIAGNOSIS — I1310 Hypertensive heart and chronic kidney disease without heart failure, with stage 1 through stage 4 chronic kidney disease, or unspecified chronic kidney disease: Secondary | ICD-10-CM | POA: Diagnosis not present

## 2023-12-27 DIAGNOSIS — D45 Polycythemia vera: Secondary | ICD-10-CM | POA: Diagnosis not present

## 2023-12-27 DIAGNOSIS — E039 Hypothyroidism, unspecified: Secondary | ICD-10-CM | POA: Diagnosis not present

## 2023-12-27 DIAGNOSIS — N1832 Chronic kidney disease, stage 3b: Secondary | ICD-10-CM | POA: Diagnosis not present

## 2023-12-27 DIAGNOSIS — I872 Venous insufficiency (chronic) (peripheral): Secondary | ICD-10-CM | POA: Diagnosis not present

## 2023-12-27 DIAGNOSIS — I739 Peripheral vascular disease, unspecified: Secondary | ICD-10-CM | POA: Diagnosis not present

## 2023-12-27 DIAGNOSIS — L03116 Cellulitis of left lower limb: Secondary | ICD-10-CM | POA: Diagnosis not present

## 2023-12-27 NOTE — Telephone Encounter (Signed)
 Copied from CRM [id number]. Topic: Clinical - Home Health Verbal Orders >> Dec 27, 2023 11:43 AM Avram MATSU wrote: Caller/Agency: Arlina Rushing Number: [phone] Service Requested: Social Work Frequency: N/A Any new concerns about the patient? No

## 2023-12-27 NOTE — Telephone Encounter (Signed)
 That's fine

## 2023-12-28 ENCOUNTER — Inpatient Hospital Stay: Admitting: Family Medicine

## 2023-12-28 NOTE — Telephone Encounter (Signed)
 Verbals given on secured line. Ok to advise per provider if call is returned

## 2023-12-29 ENCOUNTER — Telehealth: Payer: Self-pay

## 2023-12-29 ENCOUNTER — Other Ambulatory Visit: Payer: Self-pay

## 2023-12-29 DIAGNOSIS — I1310 Hypertensive heart and chronic kidney disease without heart failure, with stage 1 through stage 4 chronic kidney disease, or unspecified chronic kidney disease: Secondary | ICD-10-CM | POA: Diagnosis not present

## 2023-12-29 DIAGNOSIS — L03116 Cellulitis of left lower limb: Secondary | ICD-10-CM | POA: Diagnosis not present

## 2023-12-29 DIAGNOSIS — I739 Peripheral vascular disease, unspecified: Secondary | ICD-10-CM | POA: Diagnosis not present

## 2023-12-29 DIAGNOSIS — N1832 Chronic kidney disease, stage 3b: Secondary | ICD-10-CM | POA: Diagnosis not present

## 2023-12-29 DIAGNOSIS — I872 Venous insufficiency (chronic) (peripheral): Secondary | ICD-10-CM | POA: Diagnosis not present

## 2023-12-29 DIAGNOSIS — E039 Hypothyroidism, unspecified: Secondary | ICD-10-CM | POA: Diagnosis not present

## 2023-12-29 DIAGNOSIS — E785 Hyperlipidemia, unspecified: Secondary | ICD-10-CM | POA: Diagnosis not present

## 2023-12-29 DIAGNOSIS — I4892 Unspecified atrial flutter: Secondary | ICD-10-CM | POA: Diagnosis not present

## 2023-12-29 DIAGNOSIS — D45 Polycythemia vera: Secondary | ICD-10-CM | POA: Diagnosis not present

## 2023-12-29 NOTE — Patient Instructions (Signed)
 Visit Information  Thank you for taking time to visit with me today. Please don't hesitate to contact me if I can be of assistance to you before our next scheduled telephone appointment.  Our next appointment is by telephone on 01/05/24 at 1300  Following is a copy of your care plan:   Goals Addressed             This Visit's Progress    VBCI Transitions of Care (TOC) Care Plan       Problems:  Recent Hospitalization for treatment of syncope and collapse, UTI, Proxysmal Atrial Fib Knowledge Deficit Related to disease state and SDOH barrier: Transportation needs due to car trouble and lost car keys at [facility].  12/29/23 Patient has new phone number as of 12/27/23 he states but have not received a call from [facility] in that time frame on his old phone regarding his phone and money (he states today) that was lost. Patient running out of food due to no transportation.  Patient states he had a visit from [facility] Social worker today who just left, she gave him 52 and was provided with 2 other numbers he has called but none of them delivers food. Patient refuses to let his nephew be contacted or assist with getting to appointments or taking him anywhere. He states he is letting me live in this house near him and he will not be contacted about taking me anywhere or about the food he brings me what he can.  Goal:  Over the next 30 days, the patient will not experience hospital readmission  Interventions:  Transitions of Care: Community Resource Referral Made to address Transportation Doctor Visits  - discussed the importance of doctor visits Contacted provider for patient needs post hospital transportation for hospital follow up  Post discharge activity limitations prescribed by provider reviewed patient needs a scale Patient to call [employer] benefits on back of his card regarding meals, will update VBCI social worker to follow up.   Patient Self Care Activities:  Attend all  scheduled provider appointments Call pharmacy for medication refills 3-7 days in advance of running out of medications Call provider office for new concerns or questions  Notify RN Care Manager of TOC call rescheduling needs Participate in Transition of Care Program/Attend TOC scheduled calls Take medications as prescribed    Plan:  Follow up with provider re: has PCP follow up and now with transportation needs, SDOH referral made to [facility] team regarding insurance assistance availability  The patient has been provided with contact information for the care management team and has been advised to call with any health related questions or concerns.  Contact the hospital unit where patient's belongings were, also contact [facility] hospital security to check lost and found, gave them patient information to contact patient. 12/29/23 Patient will call [facility] customer service states, as soon as you hang up I will call them.  Will reach back out to [employer] as well.        The patient verbalized understanding of instructions, educational materials, and care plan provided today and DECLINED offer to receive copy of patient instructions, educational materials, and care plan.   Telephone follow up appointment with care management team member scheduled for: The patient has been provided with contact information for the care management team and has been advised to call with any health related questions or concerns.   Please call the care guide team at [phone] if you need to cancel or reschedule  your appointment.   Please call the [place]  National Suicide Prevention Lifeline: 682-656-9084 or TTY: 563-459-3433 TTY (684)012-2835) to talk to a trained counselor call 1-800-273-TALK (toll free, 24 hour hotline) call 911 if you are experiencing a Mental Health or Behavioral Health Crisis or need someone to talk to.  Richerd Fish, RN, BSN, CCM [facility], [facility]  Health RN Care Manager Direct Dial: [phone]

## 2023-12-29 NOTE — Transitions of Care (Post Inpatient/ED Visit) (Signed)
 Transition of Care week 2  Visit Note  12/29/2023  Name: Jerry Yeatts MRN: [id number]          DOB: 1941-03-29  Situation: Patient enrolled in [facility] 30-day program. Visit completed with patient by telephone.   Background:   Initial Transition Care Management Follow-up Telephone Call    Past Medical History:  Diagnosis Date   Acute deep vein thrombosis (DVT) of right femoral vein (HCC) 10/28/2020   US  10/24/2020 [facility] Acute right lower extremity DVT extending from the common femoral vein to  the popliteal vein.     Bladder cancer (HCC)    Genital herpes    Hyperglycemia    Hypertension    Squamous cell carcinoma     Assessment: Patient Reported Symptoms: Cognitive Cognitive Status: Able to follow simple commands, Alert and oriented to person, place, and time, Normal speech and language skills      Neurological Neurological Review of Symptoms: No symptoms reported    HEENT HEENT Symptoms Reported: No symptoms reported      Cardiovascular Cardiovascular Symptoms Reported: No symptoms reported Does patient have uncontrolled Hypertension?: Yes Is patient checking Blood Pressure at home?: No Cardiovascular Management Strategies: Activity, Adequate rest, Medication therapy Cardiovascular Self-Management Outcome: 4 (good)  Respiratory Respiratory Symptoms Reported: No symptoms reported    Endocrine Endocrine Symptoms Reported: No symptoms reported    Gastrointestinal Gastrointestinal Symptoms Reported: No symptoms reported (It's fine)      Genitourinary Genitourinary Symptoms Reported: No symptoms reported (It's fine...everything's fine)    Integumentary Integumentary Symptoms Reported: Wound Additional Integumentary Details: Left foot Skin Management Strategies: Dressing changes Skin Self-Management Outcome: 3 (uncertain)  Musculoskeletal Musculoskelatal Symptoms Reviewed: Unsteady gait Musculoskeletal Self-Management Outcome: 3 (uncertain)      Psychosocial  Psychosocial Symptoms Reported: No symptoms reported Patient insist everything is fine!         There were no vitals filed for this visit.  12/29/23 Patient states the nurse changed my dressing today and she said my BP was fine. I didn't ask her what was and no I didn't check it  Medications Reviewed Today     Reviewed by Eilleen Richerd GRADE, RN (Registered Nurse) on 12/29/23 at 1311  Med List Status: <None>   Medication Order Taking? Sig Documenting Provider Last Dose Status Informant  amiodarone  (PACERONE ) 200 MG tablet [id number]  Take 2 tablets (400 mg total) by mouth 2 (two) times daily for 10 days, THEN 1 tablet (200 mg total) daily. Alexander, Natalie, DO  Active   apixaban  (ELIQUIS ) 2.5 MG TABS tablet [id number]  Take 1 tablet (2.5 mg total) by mouth 2 (two) times daily. Alexander, Natalie, DO  Active   ciprofloxacin  (CIPRO ) 500 MG tablet [id number]  Take 1 tablet (500 mg total) by mouth daily with breakfast. Marsa Edelman, DO  Active   clopidogrel  (PLAVIX ) 75 MG tablet [id number]  Take 75 mg by mouth daily. [provider]  Active Self, Other  diclofenac  Sodium (VOLTAREN ) 1 % GEL [id number]  Apply 4 g topically 4 (four) times daily as needed.  Patient not taking: Reported on 12/29/2023   [name], MD  Active Other  docusate sodium  (COLACE) 100 MG capsule [id number]  Take 1 capsule by mouth daily as needed.  Patient not taking: Reported on 12/29/2023   [provider]  Active Other           Med Note MARVIS LELA SAILOR   Fri Feb 02, 2019 10:59 AM)    ferrous sulfate  325 (65  FE) MG tablet [id number]  Take 325 mg by mouth daily. [provider]  Active Other  furosemide  (LASIX ) 20 MG tablet [id number]  Take 2 tablets (40 mg total) by mouth daily as needed for fluid or edema. Alexander, Natalie, DO  Active            Med Note LESLY, RICHERD CINDERELLA [place] Dec 21, 2023  2:50 PM) Reviewed how this medication has changed patient twice with patient and he  verbalized understanding  hydroxyurea  (HYDREA ) 500 MG capsule [id number]  Take 500 mg by mouth as directed. 2 tablets 3 days a week, and 1 tablet all other days [provider]  Active Other  levothyroxine  (SYNTHROID ) 75 MCG tablet [id number]  TAKE 1 TABLET BY MOUTH EVERY DAY [name], MD  Active Other  Multiple Vitamins-Minerals (MULTIVITAMIN ADULT PO) [id number]  Take 1 tablet by mouth daily. [provider]  Active Other           Med Note MARVIS LELA SAILOR   Fri Feb 02, 2019 10:59 AM)    oxyCODONE -acetaminophen  (PERCOCET) 10-325 MG tablet [id number]  Take 1 tablet by mouth every 6 (six) hours as needed. for pain [name], MD  Active Other  valACYclovir  (VALTREX ) 1000 MG tablet [id number]  TAKE 1 TABLET BY MOUTH TWICE A DAY AS NEEDED Fisher, Nancyann BRAVO, MD  Active Other            Recommendation:   PCP Follow-up Referral to: VBCI Social Worker for follow up Continue Current Plan of Care - work on transportation with appointments. Patient said he would also try and call [employer]. States he has called numbers HH SW gave and no one delivers food but for pick up.  Follow Up Plan:   Telephone follow-up in 1 day Referral to Licensed Clinical Social Worker BSW Referral in place will reach out to [facility] referral source have contacted via in basket and ongoing follow up as needed. Updated on patient information received today. Encouraged patient to call this writer back. Patient verbalized follow up plan.  RICHERD Fish, RN, BSN, CCM [facility], [facility] RN Care Manager Direct Dial: [phone]

## 2024-01-02 ENCOUNTER — Ambulatory Visit: Payer: Self-pay

## 2024-01-02 NOTE — Telephone Encounter (Signed)
 FYI Only or Action Required?: FYI only for provider.  Patient was last seen in primary care on 12/09/2023 by [name], MD.  Called Nurse Triage reporting Extremity Weakness and Fatigue.  Symptoms began about a month ago.  Interventions attempted: Nothing.  Symptoms are: gradually worsening.  Triage Disposition: See Physician Within 24 Hours  Patient/caregiver understands and will follow disposition?: Yes  Copied from CRM #[id number]. Topic: Clinical - Medication Question >> Jan 02, 2024 10:22 AM Jerry Mcpherson wrote: Reason for CRM:  Patient requesting a Rx that can help with Regaining Strength gaining  (Due to recent heart surgery)  Medication due to valve in heart that was sinking -  Patient is complaining  - Having weakness in limbs  - Walking very far is difficult ** Going to request Rehabilitation(Physical Therapy) from a consult with the Cardiologist  at the  [phone] - Jerry Mcpherson Phone Number Reason for Disposition  [1] MODERATE weakness (e.g., interferes with work, school, normal activities) AND [2] persists > 3 days  Answer Assessment - Initial Assessment Questions 1. DESCRIPTION: Describe how you are feeling.     Low energy, weakness in extremities 2. SEVERITY: How bad is it?  Can you stand and walk?     States that he can only walk about 50 feet without getting tired 3. ONSET: When did these symptoms begin? (e.g., hours, days, weeks, months)     Gradually worsening over the last month 4. CAUSE: What do you think is causing the weakness or fatigue? (e.g., not drinking enough fluids, medical problem, trouble sleeping)     Valve replacement surgery 5. NEW MEDICINES:  Have you started on any new medicines recently? (e.g., opioid pain medicines, benzodiazepines, muscle relaxants, antidepressants, antihistamines, neuroleptics, beta blockers)     unsure 6. OTHER SYMPTOMS: Do you have any other symptoms? (e.g., chest pain, fever, cough, SOB,  vomiting, diarrhea, bleeding, other areas of pain)     Shortness of breath 7. PREGNANCY: Is there any chance you are pregnant? When was your last menstrual period?     N/a  Protocols used: Weakness (Generalized) and Fatigue-A-AH

## 2024-01-04 ENCOUNTER — Ambulatory Visit (INDEPENDENT_AMBULATORY_CARE_PROVIDER_SITE_OTHER): Admitting: Family Medicine

## 2024-01-04 ENCOUNTER — Encounter: Payer: Self-pay | Admitting: Family Medicine

## 2024-01-04 VITALS — BP 133/71 | HR 67 | Resp 14 | Ht 70.0 in | Wt 135.7 lb

## 2024-01-04 DIAGNOSIS — R5383 Other fatigue: Secondary | ICD-10-CM | POA: Diagnosis not present

## 2024-01-04 DIAGNOSIS — E039 Hypothyroidism, unspecified: Secondary | ICD-10-CM

## 2024-01-04 DIAGNOSIS — E785 Hyperlipidemia, unspecified: Secondary | ICD-10-CM | POA: Diagnosis not present

## 2024-01-04 DIAGNOSIS — N1832 Chronic kidney disease, stage 3b: Secondary | ICD-10-CM | POA: Diagnosis not present

## 2024-01-04 DIAGNOSIS — I1310 Hypertensive heart and chronic kidney disease without heart failure, with stage 1 through stage 4 chronic kidney disease, or unspecified chronic kidney disease: Secondary | ICD-10-CM | POA: Diagnosis not present

## 2024-01-04 DIAGNOSIS — I480 Paroxysmal atrial fibrillation: Secondary | ICD-10-CM | POA: Diagnosis not present

## 2024-01-04 DIAGNOSIS — Z23 Encounter for immunization: Secondary | ICD-10-CM

## 2024-01-04 DIAGNOSIS — I4892 Unspecified atrial flutter: Secondary | ICD-10-CM | POA: Diagnosis not present

## 2024-01-04 DIAGNOSIS — I350 Nonrheumatic aortic (valve) stenosis: Secondary | ICD-10-CM | POA: Diagnosis not present

## 2024-01-04 DIAGNOSIS — I5020 Unspecified systolic (congestive) heart failure: Secondary | ICD-10-CM

## 2024-01-04 DIAGNOSIS — D45 Polycythemia vera: Secondary | ICD-10-CM | POA: Diagnosis not present

## 2024-01-04 DIAGNOSIS — I872 Venous insufficiency (chronic) (peripheral): Secondary | ICD-10-CM | POA: Diagnosis not present

## 2024-01-04 DIAGNOSIS — I739 Peripheral vascular disease, unspecified: Secondary | ICD-10-CM | POA: Diagnosis not present

## 2024-01-04 DIAGNOSIS — L03116 Cellulitis of left lower limb: Secondary | ICD-10-CM | POA: Diagnosis not present

## 2024-01-04 NOTE — Progress Notes (Signed)
 Established patient visit   Patient: Jerry Mcpherson   DOB: October 15, 1940   83 y.o. Male  MRN: [id number] Visit Date: 01/04/2024  Today's healthcare provider: Nancyann Perry, MD   Chief Complaint  Patient presents with   Fatigue    Weakness ongoing last 2-3 months.    Subjective    Discussed the use of AI scribe software for clinical note transcription with the patient, who gave verbal consent to proceed.  History of Present Illness   Jerry Mcpherson is an 83 year old male who presents with weakness and fatigue.  He has been experiencing progressive weakness and fatigue over the past couple of months. Recently, he was hospitalized after an emergency room visit and diagnosed with UTI, CHF, moderate aortic stenosis. He was noted to have multiple episodes of paroxysmal a-fib/flutter on telemetry. UTI was treated with abx and was started on amiodarone  for a-fib/flutter.  Was discharged with Zio monitor which he is still wearing.  He reported experiencing high blood pressure and blackouts before his hospitalization. No further episodes have occurred since starting the medication.  He is scheduled for an echocardiogram in October and has an upcoming cardiology appointment in November in [place]. He prefers to follow up with a local cardiologist.  He reports low energy levels, impacting his ability to repair his car. He is unsure of how to improve his energy levels and has had to cancel or change several appointments due to car issues.       Medications: Outpatient Medications Prior to Visit  Medication Sig   amiodarone  (PACERONE ) 200 MG tablet Take 2 tablets (400 mg total) by mouth 2 (two) times daily for 10 days, THEN 1 tablet (200 mg total) daily.   apixaban  (ELIQUIS ) 2.5 MG TABS tablet Take 1 tablet (2.5 mg total) by mouth 2 (two) times daily.   clopidogrel  (PLAVIX ) 75 MG tablet Take 75 mg by mouth daily.   ferrous sulfate  325 (65 FE) MG tablet Take 325 mg by mouth daily.    furosemide  (LASIX ) 20 MG tablet Take 2 tablets (40 mg total) by mouth daily as needed for fluid or edema.   hydroxyurea  (HYDREA ) 500 MG capsule Take 500 mg by mouth as directed. 2 tablets 3 days a week, and 1 tablet all other days   levothyroxine  (SYNTHROID ) 75 MCG tablet TAKE 1 TABLET BY MOUTH EVERY DAY   Multiple Vitamins-Minerals (MULTIVITAMIN ADULT PO) Take 1 tablet by mouth daily.   oxyCODONE -acetaminophen  (PERCOCET) 10-325 MG tablet Take 1 tablet by mouth every 6 (six) hours as needed. for pain   valACYclovir  (VALTREX ) 1000 MG tablet TAKE 1 TABLET BY MOUTH TWICE A DAY AS NEEDED   [DISCONTINUED] ciprofloxacin  (CIPRO ) 500 MG tablet Take 1 tablet (500 mg total) by mouth daily with breakfast.   [DISCONTINUED] diclofenac  Sodium (VOLTAREN ) 1 % GEL Apply 4 g topically 4 (four) times daily as needed. (Patient not taking: Reported on 12/29/2023)   [DISCONTINUED] docusate sodium  (COLACE) 100 MG capsule Take 1 capsule by mouth daily as needed. (Patient not taking: Reported on 12/29/2023)   No facility-administered medications prior to visit.   Review of Systems  Constitutional:  Positive for fatigue. Negative for appetite change, chills and fever.  Respiratory:  Negative for chest tightness, shortness of breath and wheezing.   Cardiovascular:  Negative for chest pain and palpitations.  Gastrointestinal:  Negative for abdominal pain, nausea and vomiting.       Objective    BP 133/71   Pulse 67  Resp 14   Ht 5' 10 (1.778 m)   Wt 135 lb 11.2 oz (61.6 kg)   SpO2 100%   BMI 19.47 kg/m   Physical Exam   General: Appearance:    Thin male in no acute distress  Eyes:    PERRL, conjunctiva/corneas clear, EOM's intact       Lungs:     Clear to auscultation bilaterally, respirations unlabored  Heart:    Normal heart rate. Normal rhythm.  3/6 systolic murmur   MS:   All extremities are intact.    Neurologic:   Awake, alert, oriented x 3. No apparent focal neurological defect.           Assessment & Plan    1. Other fatigue (Primary) Multifactorial, but cardiac issues likely primary factor.  - Comprehensive metabolic panel with GFR - CBC  2. HFrEF (heart failure with reduced ejection fraction) (HCC) Newly diagnosed on echo during recent hospitalization. He does have follow up echo scheduled at [facility] next month.   3. Moderate aortic stenosis  Recently syncopal episodes which have resolved since hospital discharge. Unclear if related to his syncopal spells but needs cardiology follow up .   4. Paroxysmal atrial fibrillation (HCC) Multiple episodes of paroxysmal a-fib/flutter noted on telemetry with recent hospitalization.   He has appointment to establish with cardiologist in [facility] in November, but he has transportation issues and prefers to establish with local cardiologist.   - Ambulatory referral to Cardiology  5. Hypothyroidism, unspecified type Lab Results  Component Value Date   TSH 8.000 (H) 12/16/2023    Now on amiodarone .   - T4 AND TSH  Other orders - Flu vaccine HIGH DOSE PF(Fluzone Trivalent)      Nancyann Perry, MD  [facility] [phone] (phone) [phone] (fax)  [facility]

## 2024-01-04 NOTE — Patient Instructions (Signed)
 SABRA  Please review the attached list of medications and notify my office if there are any errors.   . Please bring all of your medications to every appointment so we can make sure that our medication list is the same as yours.

## 2024-01-05 ENCOUNTER — Telehealth: Payer: Self-pay

## 2024-01-05 ENCOUNTER — Other Ambulatory Visit: Payer: Self-pay | Admitting: Family Medicine

## 2024-01-05 ENCOUNTER — Ambulatory Visit: Payer: Self-pay | Admitting: Family Medicine

## 2024-01-05 ENCOUNTER — Other Ambulatory Visit: Payer: Self-pay

## 2024-01-05 DIAGNOSIS — E039 Hypothyroidism, unspecified: Secondary | ICD-10-CM

## 2024-01-05 DIAGNOSIS — M5410 Radiculopathy, site unspecified: Secondary | ICD-10-CM

## 2024-01-05 DIAGNOSIS — G589 Mononeuropathy, unspecified: Secondary | ICD-10-CM

## 2024-01-05 LAB — COMPREHENSIVE METABOLIC PANEL WITH GFR
ALT: 8 IU/L (ref 0–44)
AST: 13 IU/L (ref 0–40)
Albumin: 4.3 g/dL (ref 3.7–4.7)
Alkaline Phosphatase: 119 IU/L (ref 48–129)
BUN/Creatinine Ratio: 20 (ref 10–24)
BUN: 39 mg/dL — ABNORMAL HIGH (ref 8–27)
Bilirubin Total: 0.4 mg/dL (ref 0.0–1.2)
CO2: 16 mmol/L — ABNORMAL LOW (ref 20–29)
Calcium: 8.7 mg/dL (ref 8.6–10.2)
Chloride: 107 mmol/L — ABNORMAL HIGH (ref 96–106)
Creatinine, Ser: 1.96 mg/dL — ABNORMAL HIGH (ref 0.76–1.27)
Globulin, Total: 2.5 g/dL (ref 1.5–4.5)
Glucose: 79 mg/dL (ref 70–99)
Potassium: 4.8 mmol/L (ref 3.5–5.2)
Sodium: 140 mmol/L (ref 134–144)
Total Protein: 6.8 g/dL (ref 6.0–8.5)
eGFR: 33 mL/min/1.73 — ABNORMAL LOW (ref >59)

## 2024-01-05 LAB — CBC
Hematocrit: 50.5 % (ref 37.5–51.0)
Hemoglobin: 15.3 g/dL (ref 13.0–17.7)
MCH: 27.4 pg (ref 26.6–33.0)
MCHC: 30.3 g/dL — ABNORMAL LOW (ref 31.5–35.7)
MCV: 91 fL (ref 79–97)
Platelets: 663 x10E3/uL — ABNORMAL HIGH (ref 150–450)
RBC: 5.58 x10E6/uL (ref 4.14–5.80)
RDW: 23.3 % — ABNORMAL HIGH (ref 11.6–15.4)
WBC: 23.4 x10E3/uL (ref 3.4–10.8)

## 2024-01-05 LAB — T4 AND TSH
T4, Total: 5.8 ug/dL (ref 4.5–12.0)
TSH: 9.77 u[IU]/mL — ABNORMAL HIGH (ref 0.450–4.500)

## 2024-01-05 MED ORDER — LEVOTHYROXINE SODIUM 25 MCG PO TABS
ORAL_TABLET | ORAL | 1 refills | Status: DC
Start: 2024-01-05 — End: 2024-02-02

## 2024-01-05 NOTE — Telephone Encounter (Signed)
 Copied from CRM #[id number]. Topic: Clinical - Medication Refill >> Jan 05, 2024  5:02 PM DeAngela L wrote: Medication: oxyCODONE -acetaminophen  (PERCOCET) 10-325 MG tablet   Has the patient contacted their pharmacy? No  (Agent: If no, request that the patient contact the pharmacy for the refill. If patient does not wish to contact the pharmacy document the reason why and proceed with request.) (Agent: If yes, when and what did the pharmacy advise?)  This is the patient's preferred pharmacy:  [facility] - [place], [place] - [address] [place] [address] Phone: [phone] Fax: [phone]  Is this the correct pharmacy for this prescription? Yes  If no, delete pharmacy and type the correct one.   Has the prescription been filled recently? Yes   Is the patient out of the medication? No   Has the patient been seen for an appointment in the last year OR does the patient have an upcoming appointment? Yes   Can we respond through MyChart? No   Agent: Please be advised that Rx refills may take up to 3 business days. We ask that you follow-up with your pharmacy.

## 2024-01-05 NOTE — Patient Instructions (Signed)
 Elna Duos Bilger - I am sorry I was unable to reach you today for our scheduled appointment. I work with Gasper, Nancyann BRAVO, MD and am calling to support your healthcare needs. Please contact me at [phone] at your earliest convenience. I look forward to speaking with you soon.   Thank you,  Thersia Hoar, BSW, MHA [place]  [facility] Social Worker, Population Health [phone]

## 2024-01-06 ENCOUNTER — Other Ambulatory Visit: Payer: Self-pay

## 2024-01-06 DIAGNOSIS — G589 Mononeuropathy, unspecified: Secondary | ICD-10-CM

## 2024-01-06 DIAGNOSIS — L03116 Cellulitis of left lower limb: Secondary | ICD-10-CM | POA: Diagnosis not present

## 2024-01-06 DIAGNOSIS — I4892 Unspecified atrial flutter: Secondary | ICD-10-CM | POA: Diagnosis not present

## 2024-01-06 DIAGNOSIS — N1832 Chronic kidney disease, stage 3b: Secondary | ICD-10-CM | POA: Diagnosis not present

## 2024-01-06 DIAGNOSIS — E039 Hypothyroidism, unspecified: Secondary | ICD-10-CM | POA: Diagnosis not present

## 2024-01-06 DIAGNOSIS — I1310 Hypertensive heart and chronic kidney disease without heart failure, with stage 1 through stage 4 chronic kidney disease, or unspecified chronic kidney disease: Secondary | ICD-10-CM | POA: Diagnosis not present

## 2024-01-06 DIAGNOSIS — I739 Peripheral vascular disease, unspecified: Secondary | ICD-10-CM | POA: Diagnosis not present

## 2024-01-06 DIAGNOSIS — E785 Hyperlipidemia, unspecified: Secondary | ICD-10-CM | POA: Diagnosis not present

## 2024-01-06 DIAGNOSIS — M5410 Radiculopathy, site unspecified: Secondary | ICD-10-CM

## 2024-01-06 DIAGNOSIS — D45 Polycythemia vera: Secondary | ICD-10-CM | POA: Diagnosis not present

## 2024-01-06 DIAGNOSIS — I872 Venous insufficiency (chronic) (peripheral): Secondary | ICD-10-CM | POA: Diagnosis not present

## 2024-01-06 NOTE — Patient Instructions (Signed)
 Visit Information  Thank you for taking time to visit with me today. Please don't hesitate to contact me if I can be of assistance to you before our next scheduled telephone appointment.  Our next appointment is by telephone on 01/13/24 at 1 PM  Following is a copy of your care plan:   Goals Addressed             This Visit's Progress    VBCI Transitions of Care (TOC) Care Plan       Problems: (Updated and reviewed 01/05/24 with patient) Recent Hospitalization for treatment of syncope and collapse, UTI, Proxysmal Atrial Fib Knowledge Deficit Related to disease state and SDOH barrier: Transportation needs due to car trouble and lost car keys at [facility].  12/29/23 Patient has new phone number as of 12/27/23 he states but have not received a call from [facility] in that time frame on his old phone regarding his phone and money (he states today) that was lost. Patient running out of food due to no transportation.  Patient states he had a visit from [facility] Social worker today who just left, she gave him 31 and was provided with 2 other numbers he has called but none of them delivers food. Patient refuses to let his nephew be contacted or assist with getting to appointments or taking him anywhere. He states he is letting me live in this house near him and he will not be contacted about taking me anywhere or about the food he brings me what he can.  Goal:  Over the next 30 days, the patient will not experience hospital readmission  Interventions:  Transitions of Care: Community Resource Referral Made to address Transportation Doctor Visits  - discussed the importance of doctor visits Contacted provider for patient needs post hospital transportation for hospital follow up  Post discharge activity limitations prescribed by provider reviewed patient needs a scale Patient to call [employer] benefits on back of his card regarding meals, will update VBCI social worker to follow up.    Patient Self Care Activities:  Attend all scheduled provider appointments Call pharmacy for medication refills 3-7 days in advance of running out of medications Call provider office for new concerns or questions  Notify RN Care Manager of TOC call rescheduling needs Participate in Transition of Care Program/Attend TOC scheduled calls Take medications as prescribed    Plan:  Follow up with provider re: has PCP follow up and now with transportation needs, SDOH referral made to [facility] team regarding insurance assistance availability  The patient has been provided with contact information for the care management team and has been advised to call with any health related questions or concerns.  Contact the hospital unit where patient's belongings were, also contact [facility] hospital security to check lost and found, gave them patient information to contact patient. 12/29/23 Patient will call [facility] customer service states, as soon as you hang up I will call them.  Will reach back out to [employer] as well. Patient agrees to follow up calls with TOC. Patient states he has this phone number in his new phone and will call if needed.        The patient verbalized understanding of instructions, educational materials, and care plan provided today and DECLINED offer to receive copy of patient instructions, educational materials, and care plan.   The patient has been provided with contact information for the care management team and has been advised to call with any health related questions  or concerns.  The care management team will reach out to the patient again over the next 5- 10 business days.  Patient to consider pharmacy for assistance in getting medications managed better  Please call the care guide team at [phone] if you need to cancel or reschedule your appointment.   Please call the Suicide and Crisis Lifeline: 988 call the [place]  National Suicide Prevention Lifeline: (458)786-2288 or  TTY: 719-787-3321 TTY 6674931413) to talk to a trained counselor call 1-800-273-TALK (toll free, 24 hour hotline) call 911 if you are experiencing a Mental Health or Behavioral Health Crisis or need someone to talk to.  Richerd Fish, RN, BSN, CCM [facility], [facility] RN Care Manager Direct Dial: [phone]

## 2024-01-06 NOTE — Telephone Encounter (Signed)
 Patient requested this previously but it is still in E2C2 inbox pended for some reason.

## 2024-01-06 NOTE — Transitions of Care (Post Inpatient/ED Visit) (Signed)
  Transition of Care week 3  Visit Note  01/05/24   Name: Jerry Mcpherson MRN: [id number]          DOB: 1941-04-12  Situation: Patient enrolled in [facility] 30-day program. Visit completed with patient by telephone.   Background:   Initial Transition Care Management Follow-up Telephone Call    Past Medical History:  Diagnosis Date   Acute deep vein thrombosis (DVT) of right femoral vein (HCC) 10/28/2020   US  10/24/2020 [facility] Acute right lower extremity DVT extending from the common femoral vein to  the popliteal vein.     Bladder cancer (HCC)    Genital herpes    Hyperglycemia    Hypertension    Squamous cell carcinoma     Assessment: Patient Reported Symptoms: Cognitive Cognitive Status: No symptoms reported, Able to follow simple commands, Alert and oriented to person, place, and time, Normal speech and language skills      Neurological Neurological Review of Symptoms: No symptoms reported    HEENT HEENT Symptoms Reported: No symptoms reported HEENT Self-Management Outcome: 4 (good)    Cardiovascular Cardiovascular Symptoms Reported: No symptoms reported Does patient have uncontrolled Hypertension?: Yes Is patient checking Blood Pressure at home?: No Cardiovascular Management Strategies: Activity, Adequate rest, Medication therapy Cardiovascular Self-Management Outcome: 4 (good)  Respiratory Respiratory Symptoms Reported: No symptoms reported Respiratory Management Strategies: Activity, Adequate rest, Medication therapy  Endocrine Endocrine Symptoms Reported: No symptoms reported    Gastrointestinal Gastrointestinal Symptoms Reported: No symptoms reported      Genitourinary Genitourinary Symptoms Reported: No symptoms reported    Integumentary Integumentary Symptoms Reported: Wound Additional Integumentary Details: Left foot Skin Management Strategies: Dressing changes Skin Self-Management Outcome: 3 (uncertain)  Musculoskeletal Musculoskelatal Symptoms Reviewed:  Unsteady gait        Psychosocial Psychosocial Symptoms Reported: No symptoms reported         There were no vitals filed for this visit.  Medications Reviewed Today   Medications were not reviewed in this encounter   *Patient refused to review medications stating nothing has changed except I need to pick up or get somebody to pick up my thyroid  medications. I can't tell you one pill from the next one but the doc said that's probably why I feel weak cause I wasn't taking it. Explained importance of having medications review however he was a little upset almost yelling. Refusing need for pharmacist at this time  SDOH:  Transportation to appointments - I don't know when I'll get back to [facility] appointments because I'm a bit upset with them right now, I'll decide when it's time to go back cause they are not going to be happy with me since [facility] had to be the ones to figure out my heart issues Patient states he has the number to call for transportation and used it for appointment on 01/04/24. Ongoing follow up with VBCI BSW appointments, encouraged to answer calls patient states he does and tries to call phone numbers back.  Recommendation:   Continue Current Plan of Care Encourage to have pharmacy work with him on medication management  Follow Up Plan:  *Patient agrees to follow up next week in program* Telephone follow-up in 1 week Referral to consider Pharmacy for support on getting medications mailed as his transportation issues is ongoing.  Richerd Fish, RN, BSN, CCM [facility], [facility] RN Care Manager Direct Dial: [phone]

## 2024-01-06 NOTE — Telephone Encounter (Signed)
 Requested medications are due for refill today.  yes  Requested medications are on the active medications list.  yes  Last refill. 12/09/2023 #120 0 rf  Future visit scheduled.   yes  Notes to clinic.  Refill not delegated.    Requested Prescriptions  Pending Prescriptions Disp Refills   oxyCODONE -acetaminophen  (PERCOCET) 10-325 MG tablet 120 tablet 0    Sig: Take 1 tablet by mouth every 6 (six) hours as needed. for pain     Not Delegated - Analgesics:  Opioid Agonist Combinations Failed - 01/06/2024  3:20 PM      Failed - This refill cannot be delegated      Failed - Urine Drug Screen completed in last 360 days      Failed - Valid encounter within last 3 months    Recent Outpatient Visits           2 days ago Other fatigue   [place] [facility] [name], MD   4 weeks ago Dyspnea on exertion   [facility] [name], MD   3 months ago Bilateral hearing loss due to cerumen impaction   [facility] [name], MD   5 months ago Hand dermatitis   [place] [facility] [name], MD   7 months ago Cellulitis of toe of left foot   [facility] [name], MD

## 2024-01-07 MED ORDER — OXYCODONE-ACETAMINOPHEN 10-325 MG PO TABS: 1.0000 | ORAL_TABLET | Freq: Four times a day (QID) | ORAL | 0 refills | Status: DC | PRN

## 2024-01-09 DIAGNOSIS — I739 Peripheral vascular disease, unspecified: Secondary | ICD-10-CM | POA: Diagnosis not present

## 2024-01-09 DIAGNOSIS — D45 Polycythemia vera: Secondary | ICD-10-CM | POA: Diagnosis not present

## 2024-01-09 DIAGNOSIS — L03116 Cellulitis of left lower limb: Secondary | ICD-10-CM | POA: Diagnosis not present

## 2024-01-09 DIAGNOSIS — I4892 Unspecified atrial flutter: Secondary | ICD-10-CM | POA: Diagnosis not present

## 2024-01-09 DIAGNOSIS — E039 Hypothyroidism, unspecified: Secondary | ICD-10-CM | POA: Diagnosis not present

## 2024-01-09 DIAGNOSIS — I872 Venous insufficiency (chronic) (peripheral): Secondary | ICD-10-CM | POA: Diagnosis not present

## 2024-01-09 DIAGNOSIS — I1310 Hypertensive heart and chronic kidney disease without heart failure, with stage 1 through stage 4 chronic kidney disease, or unspecified chronic kidney disease: Secondary | ICD-10-CM | POA: Diagnosis not present

## 2024-01-09 DIAGNOSIS — E785 Hyperlipidemia, unspecified: Secondary | ICD-10-CM | POA: Diagnosis not present

## 2024-01-09 DIAGNOSIS — N1832 Chronic kidney disease, stage 3b: Secondary | ICD-10-CM | POA: Diagnosis not present

## 2024-01-13 ENCOUNTER — Other Ambulatory Visit: Payer: Self-pay

## 2024-01-13 ENCOUNTER — Telehealth: Payer: Self-pay

## 2024-01-13 NOTE — Patient Instructions (Signed)
 Visit Information  Thank you for taking time to visit with me today. Please don't hesitate to contact me if I can be of assistance to you before our next scheduled telephone appointment.  Our next appointment is by telephone on 01/17/24 at 1400  Following is a copy of your care plan:   Goals Addressed             This Visit's Progress    VBCI Transitions of Care (TOC) Care Plan       Problems: (Updated and reviewed 01/13/24 with patient) Recent Hospitalization for treatment of syncope and collapse, UTI, Proxysmal Atrial Fib Knowledge Deficit Related to disease state and SDOH barrier: Transportation needs due to car trouble and lost car keys at [facility]. Patient also has knowledge deficits with medications. States he has reviewed it with the [facility] nurse when she came out for his wound care. 12/29/23 Patient has new phone number as of 12/27/23 he states but have not received a call from [facility] in that time frame on his old phone regarding his phone and money (he states today) that was lost. Patient running out of food due to no transportation.  Patient states he had a visit from [facility] Social worker today who just left, she gave him 56 and was provided with 2 other numbers he has called but none of them delivers food. Patient refuses to let his nephew be contacted or assist with getting to appointments or taking him anywhere. He states he is letting me live in this house near him and he will not be contacted about taking me anywhere or about the food he brings me what he can.  Goal:  Over the next 30 days, the patient will not experience hospital readmission  Interventions:  Transitions of Care: Community Resource Referral Made to address Transportation Doctor Visits  - discussed the importance of doctor visits Contacted provider for patient needs post hospital transportation for hospital follow up  Post discharge activity limitations prescribed by provider reviewed patient  needs a scale Patient to call [employer] benefits on back of his card regarding meals, will update VBCI social worker to follow up.   Patient Self Care Activities:  Attend all scheduled provider appointments Call pharmacy for medication refills 3-7 days in advance of running out of medications Call provider office for new concerns or questions  Notify RN Care Manager of [facility] call rescheduling needs Participate in Transition of Care Program/Attend TOC scheduled calls Take medications as prescribed   01/13/24 Patient encouraged to call [employer] for food delivery services when he orders his groceries by his phone. Patient states that he would and encouraged to call back if having difficulty and to also follow up with HEDWIG Rouse with scheduled call on 01/16/24. States he saw his two appointments for Monday.  Plan:  Follow up with provider re: has PCP follow up and now with transportation needs, SDOH referral made to [facility] team regarding insurance assistance availability  The patient has been provided with contact information for the care management team and has been advised to call with any health related questions or concerns.  Contact the hospital unit where patient's belongings were, also contact [facility] hospital security to check lost and found, gave them patient information to contact patient. 12/29/23 Patient will call [facility] customer service states, as soon as you hang up I will call them.  Will reach back out to [employer] as well. Patient agrees to follow up calls with TOC. Patient states  he has this phone number in his new phone and will call if needed. 01/13/24 Patient says his friend has not come to help him fix his car, he has his second set of keys but nothing from the hospital with his lost keys. He state he has set up appointment for MD 01/16/24 appointment.         Patient verbalizes understanding of instructions and care plan provided today and agrees to view in MyChart. Active  MyChart status and patient understanding of how to access instructions and care plan via MyChart confirmed with patient.     Telephone follow up appointment with care management team member scheduled for: The patient will call PCP* as advised to follow up on needs and worsening symptoms.   Please call the care guide team at [phone] if you need to cancel or reschedule your appointment.   Please call the Suicide and Crisis Lifeline: 988 call the [place]  National Suicide Prevention Lifeline: (319)597-7512 or TTY: (610)445-5155 TTY 785-479-7305) to talk to a trained counselor call 1-800-273-TALK (toll free, 24 hour hotline) call 911 if you are experiencing a Mental Health or Behavioral Health Crisis or need someone to talk to.  Richerd Fish, RN, BSN, CCM [facility], [facility] RN Care Manager Direct Dial: [phone]

## 2024-01-13 NOTE — Transitions of Care (Post Inpatient/ED Visit) (Signed)
  Transition of Care week 4  Visit Note  01/13/2024  Name: Jerry Mcpherson MRN: [id number]          DOB: 10-27-1940  Situation: Patient enrolled in [facility] 30-day program. Visit completed with patient by telephone.   Background:   Initial Transition Care Management Follow-up Telephone Call    Past Medical History:  Diagnosis Date   Acute deep vein thrombosis (DVT) of right femoral vein (HCC) 10/28/2020   US  10/24/2020 [facility] Acute right lower extremity DVT extending from the common femoral vein to  the popliteal vein.     Bladder cancer (HCC)    Genital herpes    Hyperglycemia    Hypertension    Squamous cell carcinoma     Assessment: Patient Reported Symptoms: Cognitive Cognitive Status: No symptoms reported, Able to follow simple commands, Alert and oriented to person, place, and time, Normal speech and language skills      Neurological Neurological Review of Symptoms: Weakness (Just haven't gotten up to speed yet with this heart issue)    HEENT HEENT Symptoms Reported: No symptoms reported      Cardiovascular Cardiovascular Symptoms Reported: Fatigue Does patient have uncontrolled Hypertension?: Yes Is patient checking Blood Pressure at home?: No Patient's Recent BP reading at home: Patient states home health is checking, Cardiovascular Management Strategies: Activity, Adequate rest, Medication therapy  Respiratory Respiratory Symptoms Reported: No symptoms reported    Endocrine Endocrine Symptoms Reported: No symptoms reported Is patient diabetic?: No    Gastrointestinal Gastrointestinal Symptoms Reported: No symptoms reported      Genitourinary Genitourinary Symptoms Reported: No symptoms reported Additional Genitourinary Details: Bladder Cancer - treated at [facility]    Integumentary Integumentary Symptoms Reported: Wound Additional Integumentary Details: Left foot - Home Health still doing dressing changes Skin Management Strategies: Dressing changes Skin Comment:  Patient states the home health nurse says it looks good but his wound doctor said that the insurance would not coverage the wound device which would have healed it sooner  Musculoskeletal Musculoskelatal Symptoms Reviewed: Unsteady gait        Psychosocial Psychosocial Symptoms Reported: No symptoms reported (Denies)         Vitals:    Medications Reviewed Today   Medications were not reviewed in this encounter    *Patient would not review medications states nothing has change and that he is taking his medications like he is supposed to*  Recommendation:   Patient confirms his appointment for Monday 01/16/24 and Referral to: BSW remains active for follow up on Monday, 01/16/24 Continue Current Plan of Care  Follow Up Plan:   Telephone follow-up in 1 week Telephone follow up appointment date/time:  01/17/24 at 1400  Richerd Fish, Charity fundraiser, Scientist, research (physical sciences), CCM [employer], [employer] Health RN Care Manager Direct Dial: [phone]

## 2024-01-16 ENCOUNTER — Ambulatory Visit: Admitting: Physician Assistant

## 2024-01-16 ENCOUNTER — Other Ambulatory Visit: Payer: Self-pay

## 2024-01-16 NOTE — Patient Outreach (Signed)
 Complex Care Management   Visit Note  01/16/2024  Name:  Nazeer Romney MRN: [id number] DOB: Dec 30, 1940  Situation: Referral received for Complex Care Management related to SDOH Barriers:  Transportation I obtained verbal consent from Patient.  Visit completed with Patient  on the phone  Background:   Past Medical History:  Diagnosis Date   Acute deep vein thrombosis (DVT) of right femoral vein (HCC) 10/28/2020   US  10/24/2020 [facility] Acute right lower extremity DVT extending from the common femoral vein to  the popliteal vein.     Bladder cancer (HCC)    Genital herpes    Hyperglycemia    Hypertension    Squamous cell carcinoma     Assessment: SW completed a telephone outreach with patient, he states he did use [address] and they cancelled his ride at the last minute due to being under staff and patient missed his appointment. SW informed patient there were no other transportation resources in [place] at this time. SW informed pt he has been placed on the waitlist for [facility]. No other resources are needed at this time.  SDOH Interventions    Flowsheet Row Patient Outreach Telephone from 01/05/2024 in [place] POPULATION HEALTH DEPARTMENT Patient Outreach Telephone from 12/29/2023 in [place] POPULATION HEALTH DEPARTMENT Patient Outreach Telephone from 12/22/2023 in [place] POPULATION HEALTH DEPARTMENT Telephone from 12/21/2023 in [place] POPULATION HEALTH DEPARTMENT Clinical Support from 08/10/2023 in [facility] Care Coordination from 11/16/2022 in [facility] Community Care Coordination  SDOH Interventions        Food Insecurity Interventions AMB Referral  [call attempted by Child psychotherapist and home health social worker he states came by - ongoing home health RN] AMB Referral -- Intervention Not Indicated Intervention Not Indicated Intervention Not Indicated, Other (Comment)  [Uses [employer] Benefits]  Housing  Interventions Intervention Not Indicated Intervention Not Indicated  [Will update VBCI Child psychotherapist of conversation] -- Intervention Not Indicated Intervention Not Indicated Other (Comment)  [Looking for housing options]  Transportation Interventions AMB Referral  [I don't know when I'll get back to [facility] appointments because I'm a bit upset with them right now, I'll decide when it's time to go back cause they are not going to be happy with me since [facility] had to be the ones to figure out my heart issues] AMB Referral  [Patient's been referred and states no help yet! Canceling follow up appointments] [facility] Provided AMB Referral Intervention Not Indicated --  [Has a vehicle]  Utilities Interventions -- Intervention Not Indicated -- Intervention Not Indicated Intervention Not Indicated Intervention Not Indicated  [Homeless]  Alcohol Usage Interventions -- -- -- -- Intervention Not Indicated (Score <7) --  Depression Interventions/Treatment  -- -- -- -- PHQ2-9 Score <4 Follow-up Not Indicated --  Financial Strain Interventions -- -- -- -- Intervention Not Indicated --  Physical Activity Interventions -- -- -- -- Patient Declined --  Stress Interventions -- -- -- -- Intervention Not Indicated --  Social Connections Interventions -- -- -- -- Intervention Not Indicated --  Health Literacy Interventions -- -- -- -- Intervention Not Indicated --    Recommendation:   No recommendations at this time  Follow Up Plan:   Telephone follow-up in 1 month  Thersia Hoar, BSW, [facility]  [facility] Social Worker, Population Health [phone]

## 2024-01-16 NOTE — Patient Instructions (Signed)
 Visit Information  Thank you for taking time to visit with me today. Please don't hesitate to contact me if I can be of assistance to you before our next scheduled appointment.  Your next care management appointment is by telephone on 02/16/24 at 11am    Please call the care guide team at [phone] if you need to cancel, schedule, or reschedule an appointment.   Please call the Suicide and Crisis Lifeline: 988 call the [place]  National Suicide Prevention Lifeline: 336-110-9057 or TTY: (571)711-7358 TTY (860)028-0329) to talk to a trained counselor call 1-800-273-TALK (toll free, 24 hour hotline) call 911 if you are experiencing a Mental Health or Behavioral Health Crisis or need someone to talk to.  Thersia Hoar, HEDWIG, MHA [place]  [facility] Social Worker, Population Health [phone]

## 2024-01-17 ENCOUNTER — Other Ambulatory Visit: Payer: Self-pay

## 2024-01-17 ENCOUNTER — Telehealth: Payer: Self-pay

## 2024-01-19 DIAGNOSIS — N1832 Chronic kidney disease, stage 3b: Secondary | ICD-10-CM | POA: Diagnosis not present

## 2024-01-19 DIAGNOSIS — I4892 Unspecified atrial flutter: Secondary | ICD-10-CM | POA: Diagnosis not present

## 2024-01-19 DIAGNOSIS — I739 Peripheral vascular disease, unspecified: Secondary | ICD-10-CM | POA: Diagnosis not present

## 2024-01-19 DIAGNOSIS — I872 Venous insufficiency (chronic) (peripheral): Secondary | ICD-10-CM | POA: Diagnosis not present

## 2024-01-19 DIAGNOSIS — E785 Hyperlipidemia, unspecified: Secondary | ICD-10-CM | POA: Diagnosis not present

## 2024-01-19 DIAGNOSIS — E039 Hypothyroidism, unspecified: Secondary | ICD-10-CM | POA: Diagnosis not present

## 2024-01-19 DIAGNOSIS — L03116 Cellulitis of left lower limb: Secondary | ICD-10-CM | POA: Diagnosis not present

## 2024-01-19 DIAGNOSIS — D45 Polycythemia vera: Secondary | ICD-10-CM | POA: Diagnosis not present

## 2024-01-19 DIAGNOSIS — I1310 Hypertensive heart and chronic kidney disease without heart failure, with stage 1 through stage 4 chronic kidney disease, or unspecified chronic kidney disease: Secondary | ICD-10-CM | POA: Diagnosis not present

## 2024-01-23 ENCOUNTER — Encounter: Payer: Self-pay | Attending: Physician Assistant | Admitting: Physician Assistant

## 2024-01-23 DIAGNOSIS — N189 Chronic kidney disease, unspecified: Secondary | ICD-10-CM | POA: Diagnosis not present

## 2024-01-23 DIAGNOSIS — Z8551 Personal history of malignant neoplasm of bladder: Secondary | ICD-10-CM | POA: Insufficient documentation

## 2024-01-23 DIAGNOSIS — Z9221 Personal history of antineoplastic chemotherapy: Secondary | ICD-10-CM | POA: Diagnosis not present

## 2024-01-23 DIAGNOSIS — Z86718 Personal history of other venous thrombosis and embolism: Secondary | ICD-10-CM | POA: Diagnosis not present

## 2024-01-23 DIAGNOSIS — I70203 Unspecified atherosclerosis of native arteries of extremities, bilateral legs: Secondary | ICD-10-CM | POA: Diagnosis not present

## 2024-01-23 DIAGNOSIS — Z872 Personal history of diseases of the skin and subcutaneous tissue: Secondary | ICD-10-CM | POA: Insufficient documentation

## 2024-01-23 DIAGNOSIS — Z09 Encounter for follow-up examination after completed treatment for conditions other than malignant neoplasm: Secondary | ICD-10-CM | POA: Insufficient documentation

## 2024-01-23 DIAGNOSIS — D45 Polycythemia vera: Secondary | ICD-10-CM | POA: Diagnosis not present

## 2024-01-23 DIAGNOSIS — L97522 Non-pressure chronic ulcer of other part of left foot with fat layer exposed: Secondary | ICD-10-CM | POA: Diagnosis not present

## 2024-01-23 DIAGNOSIS — I129 Hypertensive chronic kidney disease with stage 1 through stage 4 chronic kidney disease, or unspecified chronic kidney disease: Secondary | ICD-10-CM | POA: Diagnosis not present

## 2024-01-23 DIAGNOSIS — I7389 Other specified peripheral vascular diseases: Secondary | ICD-10-CM | POA: Diagnosis not present

## 2024-01-24 ENCOUNTER — Telehealth: Payer: Self-pay

## 2024-01-24 DIAGNOSIS — I5022 Chronic systolic (congestive) heart failure: Secondary | ICD-10-CM | POA: Diagnosis not present

## 2024-01-24 DIAGNOSIS — I739 Peripheral vascular disease, unspecified: Secondary | ICD-10-CM | POA: Diagnosis not present

## 2024-01-24 DIAGNOSIS — N1832 Chronic kidney disease, stage 3b: Secondary | ICD-10-CM | POA: Diagnosis not present

## 2024-01-24 DIAGNOSIS — I4892 Unspecified atrial flutter: Secondary | ICD-10-CM | POA: Diagnosis not present

## 2024-01-24 DIAGNOSIS — B351 Tinea unguium: Secondary | ICD-10-CM

## 2024-01-24 DIAGNOSIS — B961 Klebsiella pneumoniae [K. pneumoniae] as the cause of diseases classified elsewhere: Secondary | ICD-10-CM | POA: Diagnosis not present

## 2024-01-24 DIAGNOSIS — E039 Hypothyroidism, unspecified: Secondary | ICD-10-CM | POA: Diagnosis not present

## 2024-01-24 DIAGNOSIS — B9689 Other specified bacterial agents as the cause of diseases classified elsewhere: Secondary | ICD-10-CM | POA: Diagnosis not present

## 2024-01-24 DIAGNOSIS — I1310 Hypertensive heart and chronic kidney disease without heart failure, with stage 1 through stage 4 chronic kidney disease, or unspecified chronic kidney disease: Secondary | ICD-10-CM | POA: Diagnosis not present

## 2024-01-24 DIAGNOSIS — N390 Urinary tract infection, site not specified: Secondary | ICD-10-CM | POA: Diagnosis not present

## 2024-01-24 NOTE — Telephone Encounter (Signed)
 Copied from CRM [id number]. Topic: Referral - Request for Referral >> Jan 24, 2024  9:44 AM Jeoffrey H wrote: Did the patient discuss referral with their provider in the last year? Yes, was seen by both derm and podiatry  last year but wanting new referrals (If No - schedule appointment) (If Yes - send message)  Appointment offered? No, wants new referrals  Type of order/referral and detailed reason for visit: Dermatologist and Podiatrist   Preference of office, provider, location: No,   If referral order, have you been seen by this specialty before? Yes, toe nails are growing too long and wants them cut properly, and dermatology due to skin issues. (See chart for when and where). (If Yes, this issue or another issue? When? Where?  Can we respond through MyChart? No

## 2024-01-26 DIAGNOSIS — E039 Hypothyroidism, unspecified: Secondary | ICD-10-CM | POA: Diagnosis not present

## 2024-01-26 DIAGNOSIS — I739 Peripheral vascular disease, unspecified: Secondary | ICD-10-CM | POA: Diagnosis not present

## 2024-01-26 DIAGNOSIS — B9689 Other specified bacterial agents as the cause of diseases classified elsewhere: Secondary | ICD-10-CM | POA: Diagnosis not present

## 2024-01-26 DIAGNOSIS — I5022 Chronic systolic (congestive) heart failure: Secondary | ICD-10-CM | POA: Diagnosis not present

## 2024-01-26 DIAGNOSIS — N1832 Chronic kidney disease, stage 3b: Secondary | ICD-10-CM | POA: Diagnosis not present

## 2024-01-26 DIAGNOSIS — I1310 Hypertensive heart and chronic kidney disease without heart failure, with stage 1 through stage 4 chronic kidney disease, or unspecified chronic kidney disease: Secondary | ICD-10-CM | POA: Diagnosis not present

## 2024-01-26 DIAGNOSIS — N390 Urinary tract infection, site not specified: Secondary | ICD-10-CM | POA: Diagnosis not present

## 2024-01-26 DIAGNOSIS — B961 Klebsiella pneumoniae [K. pneumoniae] as the cause of diseases classified elsewhere: Secondary | ICD-10-CM | POA: Diagnosis not present

## 2024-01-26 DIAGNOSIS — I4892 Unspecified atrial flutter: Secondary | ICD-10-CM | POA: Diagnosis not present

## 2024-01-27 NOTE — Addendum Note (Signed)
 Addended by: CHERRY CHIQUITA HERO on: 01/27/2024 01:59 PM   Modules accepted: Orders

## 2024-01-27 NOTE — Telephone Encounter (Signed)
 He has been followed by dermatologist jonathan cook.is he wanting referral back to him?

## 2024-01-30 NOTE — Addendum Note (Signed)
 Addended by: [name] on: 01/30/2024 12:58 PM   Modules accepted: Orders

## 2024-01-31 DIAGNOSIS — D492 Neoplasm of unspecified behavior of bone, soft tissue, and skin: Secondary | ICD-10-CM | POA: Diagnosis not present

## 2024-01-31 DIAGNOSIS — C4442 Squamous cell carcinoma of skin of scalp and neck: Secondary | ICD-10-CM | POA: Diagnosis not present

## 2024-01-31 DIAGNOSIS — D485 Neoplasm of uncertain behavior of skin: Secondary | ICD-10-CM | POA: Diagnosis not present

## 2024-02-01 ENCOUNTER — Other Ambulatory Visit: Payer: Self-pay | Admitting: Family Medicine

## 2024-02-01 ENCOUNTER — Encounter: Payer: Self-pay | Admitting: Family Medicine

## 2024-02-01 ENCOUNTER — Ambulatory Visit: Admitting: Family Medicine

## 2024-02-01 VITALS — BP 122/70 | HR 89 | Resp 16 | Wt 142.7 lb

## 2024-02-01 DIAGNOSIS — G589 Mononeuropathy, unspecified: Secondary | ICD-10-CM

## 2024-02-01 DIAGNOSIS — M5410 Radiculopathy, site unspecified: Secondary | ICD-10-CM | POA: Diagnosis not present

## 2024-02-01 DIAGNOSIS — E039 Hypothyroidism, unspecified: Secondary | ICD-10-CM | POA: Diagnosis not present

## 2024-02-01 DIAGNOSIS — I4892 Unspecified atrial flutter: Secondary | ICD-10-CM

## 2024-02-01 MED ORDER — AMIODARONE HCL 200 MG PO TABS: 200.0000 mg | ORAL_TABLET | Freq: Every day | ORAL | 0 refills | Status: DC

## 2024-02-01 NOTE — Patient Instructions (Signed)
 Please review the attached list of medications and notify my office if there are any errors.   Call [facility] at

## 2024-02-02 ENCOUNTER — Ambulatory Visit: Payer: Self-pay | Admitting: Family Medicine

## 2024-02-02 DIAGNOSIS — I1310 Hypertensive heart and chronic kidney disease without heart failure, with stage 1 through stage 4 chronic kidney disease, or unspecified chronic kidney disease: Secondary | ICD-10-CM | POA: Diagnosis not present

## 2024-02-02 DIAGNOSIS — B9689 Other specified bacterial agents as the cause of diseases classified elsewhere: Secondary | ICD-10-CM | POA: Diagnosis not present

## 2024-02-02 DIAGNOSIS — E039 Hypothyroidism, unspecified: Secondary | ICD-10-CM | POA: Diagnosis not present

## 2024-02-02 DIAGNOSIS — I5022 Chronic systolic (congestive) heart failure: Secondary | ICD-10-CM | POA: Diagnosis not present

## 2024-02-02 DIAGNOSIS — B961 Klebsiella pneumoniae [K. pneumoniae] as the cause of diseases classified elsewhere: Secondary | ICD-10-CM | POA: Diagnosis not present

## 2024-02-02 DIAGNOSIS — I350 Nonrheumatic aortic (valve) stenosis: Secondary | ICD-10-CM

## 2024-02-02 DIAGNOSIS — I5020 Unspecified systolic (congestive) heart failure: Secondary | ICD-10-CM

## 2024-02-02 DIAGNOSIS — N1832 Chronic kidney disease, stage 3b: Secondary | ICD-10-CM | POA: Diagnosis not present

## 2024-02-02 DIAGNOSIS — I739 Peripheral vascular disease, unspecified: Secondary | ICD-10-CM | POA: Diagnosis not present

## 2024-02-02 DIAGNOSIS — I4892 Unspecified atrial flutter: Secondary | ICD-10-CM

## 2024-02-02 DIAGNOSIS — N390 Urinary tract infection, site not specified: Secondary | ICD-10-CM | POA: Diagnosis not present

## 2024-02-02 LAB — TSH+FREE T4
Free T4: 1.21 ng/dL (ref 0.82–1.77)
TSH: 10.8 u[IU]/mL — ABNORMAL HIGH (ref 0.450–4.500)

## 2024-02-02 MED ORDER — LEVOTHYROXINE SODIUM 50 MCG PO TABS: 50.0000 ug | ORAL_TABLET | Freq: Every day | ORAL | 0 refills | Status: DC

## 2024-02-02 NOTE — Progress Notes (Signed)
 Called [facility] and canceled prescription.

## 2024-02-03 ENCOUNTER — Other Ambulatory Visit: Payer: Self-pay | Admitting: Respiratory Therapy

## 2024-02-03 ENCOUNTER — Ambulatory Visit
Admission: RE | Admit: 2024-02-03 | Discharge: 2024-02-03 | Disposition: A | Discharge status: Home / Self Care | Source: Ambulatory Visit | Attending: Family Medicine | Admitting: Family Medicine

## 2024-02-03 DIAGNOSIS — I350 Nonrheumatic aortic (valve) stenosis: Secondary | ICD-10-CM | POA: Insufficient documentation

## 2024-02-03 DIAGNOSIS — I10 Essential (primary) hypertension: Secondary | ICD-10-CM | POA: Diagnosis not present

## 2024-02-03 DIAGNOSIS — R0609 Other forms of dyspnea: Secondary | ICD-10-CM | POA: Insufficient documentation

## 2024-02-03 DIAGNOSIS — I340 Nonrheumatic mitral (valve) insufficiency: Secondary | ICD-10-CM | POA: Diagnosis not present

## 2024-02-03 LAB — ECHOCARDIOGRAM COMPLETE
AR max vel: 1.44 cm2
AV Area VTI: 1.66 cm2
AV Area mean vel: 1.52 cm2
AV Mean grad: 11 mmHg
AV Peak grad: 21 mmHg
Ao pk vel: 2.29 m/s
Area-P 1/2: 8.82 cm2
Calc EF: 24.4 %
S' Lateral: 4.6 cm
Single Plane A2C EF: 21.5 %
Single Plane A4C EF: 32.9 %

## 2024-02-03 NOTE — Progress Notes (Signed)
*  PRELIMINARY RESULTS* Echocardiogram 2D Echocardiogram has been performed.  Jerry Mcpherson 02/03/2024, 10:06 AM

## 2024-02-03 NOTE — Telephone Encounter (Signed)
 Copied from CRM [id number]. Topic: Referral - Request for Referral >> Feb 03, 2024 11:47 AM Rosaria BRAVO wrote: Did the patient discuss referral with their provider in the last year? Yes (If No - schedule appointment) (If Yes - send message)  Appointment offered? Yes  Type of order/referral and detailed reason for visit: Cardiology  Needs a follow up from EKG. Does not want to go to [facility].   Preference of office, provider, location: Within [facility]   If referral order, have you been seen by this specialty before? No.  (If Yes, this issue or another issue? When? Where?  Can we respond through MyChart? Yes

## 2024-02-06 DIAGNOSIS — I5020 Unspecified systolic (congestive) heart failure: Secondary | ICD-10-CM | POA: Insufficient documentation

## 2024-02-06 DIAGNOSIS — I5022 Chronic systolic (congestive) heart failure: Principal | ICD-10-CM

## 2024-02-07 ENCOUNTER — Ambulatory Visit: Admitting: Podiatry

## 2024-02-08 ENCOUNTER — Telehealth: Payer: Self-pay | Admitting: Family Medicine

## 2024-02-08 NOTE — Telephone Encounter (Signed)
 Copied from CRM [id number]. Topic: General - Other >> Feb 08, 2024 11:43 AM Eva FALCON wrote: Reason for CRM: Amy a pharmacist from [facility] was calling in regards to the patients heart failure. Wanted to recommend some medication for the heart failure. She mentioned Ace, Arb, Arni classes of drugs. Requesting call back to discuss further. Call back number is [phone] states can speak to any available pharmacist. Also faxing over form.

## 2024-02-09 ENCOUNTER — Ambulatory Visit: Payer: Self-pay | Admitting: Family Medicine

## 2024-02-09 ENCOUNTER — Ambulatory Visit: Admitting: Podiatry

## 2024-02-09 DIAGNOSIS — I4892 Unspecified atrial flutter: Secondary | ICD-10-CM | POA: Diagnosis not present

## 2024-02-09 DIAGNOSIS — I5020 Unspecified systolic (congestive) heart failure: Secondary | ICD-10-CM

## 2024-02-09 DIAGNOSIS — B961 Klebsiella pneumoniae [K. pneumoniae] as the cause of diseases classified elsewhere: Secondary | ICD-10-CM | POA: Diagnosis not present

## 2024-02-09 DIAGNOSIS — N390 Urinary tract infection, site not specified: Secondary | ICD-10-CM | POA: Diagnosis not present

## 2024-02-09 DIAGNOSIS — N1832 Chronic kidney disease, stage 3b: Secondary | ICD-10-CM

## 2024-02-09 DIAGNOSIS — M79674 Pain in right toe(s): Secondary | ICD-10-CM

## 2024-02-09 DIAGNOSIS — I739 Peripheral vascular disease, unspecified: Secondary | ICD-10-CM | POA: Diagnosis not present

## 2024-02-09 DIAGNOSIS — I1310 Hypertensive heart and chronic kidney disease without heart failure, with stage 1 through stage 4 chronic kidney disease, or unspecified chronic kidney disease: Secondary | ICD-10-CM | POA: Diagnosis not present

## 2024-02-09 DIAGNOSIS — I5022 Chronic systolic (congestive) heart failure: Secondary | ICD-10-CM | POA: Diagnosis not present

## 2024-02-09 DIAGNOSIS — B9689 Other specified bacterial agents as the cause of diseases classified elsewhere: Secondary | ICD-10-CM | POA: Diagnosis not present

## 2024-02-09 DIAGNOSIS — M79675 Pain in left toe(s): Secondary | ICD-10-CM

## 2024-02-09 DIAGNOSIS — B351 Tinea unguium: Secondary | ICD-10-CM | POA: Diagnosis not present

## 2024-02-09 DIAGNOSIS — E039 Hypothyroidism, unspecified: Secondary | ICD-10-CM | POA: Diagnosis not present

## 2024-02-09 MED ORDER — DAPAGLIFLOZIN PROPANEDIOL 5 MG PO TABS: 5.0000 mg | ORAL_TABLET | Freq: Every day | ORAL | 1 refills | Status: DC

## 2024-02-09 NOTE — Telephone Encounter (Signed)
 Copied from CRM [id number]. Topic: Clinical - Medication Question >> Feb 09, 2024 12:00 PM Delon DASEN wrote: Reason for CRM: dapagliflozin  propanediol (FARXIGA ) 5 MG TABS tablet- pharmacy told patient this medication is for blood sugar, please call to advise [phone]

## 2024-02-09 NOTE — Progress Notes (Signed)
   SUBJECTIVE Patient presents to office today complaining of elongated, thickened nails that cause pain while ambulating in shoes.  Patient is unable to trim their own nails. Patient is here for further evaluation and treatment.  Past Medical History:  Diagnosis Date   Acute deep vein thrombosis (DVT) of right femoral vein (HCC) 10/28/2020   US  10/24/2020 [facility] Acute right lower extremity DVT extending from the common femoral vein to  the popliteal vein.     Bladder cancer (HCC)    Genital herpes    Hyperglycemia    Hypertension    Squamous cell carcinoma     OBJECTIVE General Patient is awake, alert, and oriented x 3 and in no acute distress. Derm Skin is dry and supple bilateral. Negative open lesions or macerations. Remaining integument unremarkable. Nails are tender, long, thickened and dystrophic with subungual debris, consistent with onychomycosis, 1-5 bilateral. No signs of infection noted. Vasc  DP and PT pedal pulses palpable bilaterally. Temperature gradient within normal limits.  Neuro Epicritic and protective threshold sensation grossly intact bilaterally.  Musculoskeletal Exam No symptomatic pedal deformities noted bilateral. Muscular strength within normal limits.  ASSESSMENT 1.  Pain due to onychomycosis of toenails both  PLAN OF CARE 1. Patient evaluated today.  2. Instructed to maintain good pedal hygiene and foot care.  3. Mechanical debridement of nails 1-5 bilaterally performed using a nail nipper. Filed with dremel without incident.  4. Return to clinic in 3 mos.

## 2024-02-09 NOTE — Telephone Encounter (Signed)
 It's also a heart medication. It's wont have any affect on his blood sugar if it's normal to begin with.

## 2024-02-10 NOTE — Telephone Encounter (Signed)
 Yes, he needs to take both. The amiodarone  keeps his heart in a normal rhythm. The Farxiga  helps his heart pump blood more efficiently.

## 2024-02-13 ENCOUNTER — Encounter: Attending: Physician Assistant | Admitting: Physician Assistant

## 2024-02-13 DIAGNOSIS — I7389 Other specified peripheral vascular diseases: Secondary | ICD-10-CM | POA: Diagnosis not present

## 2024-02-13 DIAGNOSIS — L97522 Non-pressure chronic ulcer of other part of left foot with fat layer exposed: Secondary | ICD-10-CM | POA: Diagnosis not present

## 2024-02-13 DIAGNOSIS — N189 Chronic kidney disease, unspecified: Secondary | ICD-10-CM | POA: Diagnosis not present

## 2024-02-13 DIAGNOSIS — I7025 Atherosclerosis of native arteries of other extremities with ulceration: Secondary | ICD-10-CM | POA: Diagnosis not present

## 2024-02-13 DIAGNOSIS — I129 Hypertensive chronic kidney disease with stage 1 through stage 4 chronic kidney disease, or unspecified chronic kidney disease: Secondary | ICD-10-CM | POA: Diagnosis not present

## 2024-02-16 ENCOUNTER — Other Ambulatory Visit: Payer: Self-pay

## 2024-02-16 DIAGNOSIS — N390 Urinary tract infection, site not specified: Secondary | ICD-10-CM | POA: Diagnosis not present

## 2024-02-16 DIAGNOSIS — N1832 Chronic kidney disease, stage 3b: Secondary | ICD-10-CM | POA: Diagnosis not present

## 2024-02-16 DIAGNOSIS — I1310 Hypertensive heart and chronic kidney disease without heart failure, with stage 1 through stage 4 chronic kidney disease, or unspecified chronic kidney disease: Secondary | ICD-10-CM | POA: Diagnosis not present

## 2024-02-16 DIAGNOSIS — B9689 Other specified bacterial agents as the cause of diseases classified elsewhere: Secondary | ICD-10-CM | POA: Diagnosis not present

## 2024-02-16 DIAGNOSIS — E039 Hypothyroidism, unspecified: Secondary | ICD-10-CM | POA: Diagnosis not present

## 2024-02-16 DIAGNOSIS — I4892 Unspecified atrial flutter: Secondary | ICD-10-CM | POA: Diagnosis not present

## 2024-02-16 DIAGNOSIS — I739 Peripheral vascular disease, unspecified: Secondary | ICD-10-CM | POA: Diagnosis not present

## 2024-02-16 DIAGNOSIS — B961 Klebsiella pneumoniae [K. pneumoniae] as the cause of diseases classified elsewhere: Secondary | ICD-10-CM | POA: Diagnosis not present

## 2024-02-16 DIAGNOSIS — I5022 Chronic systolic (congestive) heart failure: Secondary | ICD-10-CM | POA: Diagnosis not present

## 2024-02-16 NOTE — Patient Outreach (Signed)
 Social Drivers of Health  Community Resource and Care Coordination Visit Note   02/16/2024  Name: Jerry Mcpherson MRN: [id number] DOB:10-22-1940  Situation: Referral received for [facility] needs assessment and assistance related to Transportation. I obtained verbal consent from Patient.  Visit completed with Patient on the phone.   Background:      Assessment:   Goals Addressed             This Visit's Progress    COMPLETED: BSW VBCI Social Work Care Plan       Problems:   Transportation  CSW Clinical Goal(s):   Over the next 30 days the Patient will will follow up with transportation as directed by Social Work.  Interventions:  SW provided patient and home health nurse Stacy with the telephone number for [employer]. SW placed patient on the waitlist for [employer].  Patient Goals/Self-Care Activities:  Patient and home health nurse will schedule transportation for 9/17 appointment Patient has used [facility]:   The patient has been provided with contact information for the care management team and has been advised to call with any health related questions or concerns.         Recommendation:   Contact Sw for any future needs that may come up.  Follow Up Plan:   Patient has achieved all patient stated goals. [employer] will be closed. Patient has been provided contact information should new needs arise.   Thersia Hoar, HEDWIG, MHA [place]  [facility] Social Worker, Population Health [phone]

## 2024-02-16 NOTE — Patient Instructions (Signed)

## 2024-02-20 ENCOUNTER — Telehealth: Payer: Self-pay

## 2024-02-20 DIAGNOSIS — B961 Klebsiella pneumoniae [K. pneumoniae] as the cause of diseases classified elsewhere: Secondary | ICD-10-CM | POA: Diagnosis not present

## 2024-02-20 DIAGNOSIS — N1832 Chronic kidney disease, stage 3b: Secondary | ICD-10-CM | POA: Diagnosis not present

## 2024-02-20 DIAGNOSIS — I739 Peripheral vascular disease, unspecified: Secondary | ICD-10-CM | POA: Diagnosis not present

## 2024-02-20 DIAGNOSIS — N390 Urinary tract infection, site not specified: Secondary | ICD-10-CM | POA: Diagnosis not present

## 2024-02-20 DIAGNOSIS — E039 Hypothyroidism, unspecified: Secondary | ICD-10-CM | POA: Diagnosis not present

## 2024-02-20 DIAGNOSIS — I5022 Chronic systolic (congestive) heart failure: Secondary | ICD-10-CM | POA: Diagnosis not present

## 2024-02-20 DIAGNOSIS — B9689 Other specified bacterial agents as the cause of diseases classified elsewhere: Secondary | ICD-10-CM | POA: Diagnosis not present

## 2024-02-20 DIAGNOSIS — I1310 Hypertensive heart and chronic kidney disease without heart failure, with stage 1 through stage 4 chronic kidney disease, or unspecified chronic kidney disease: Secondary | ICD-10-CM | POA: Diagnosis not present

## 2024-02-20 DIAGNOSIS — I4892 Unspecified atrial flutter: Secondary | ICD-10-CM | POA: Diagnosis not present

## 2024-02-20 NOTE — Telephone Encounter (Signed)
 Copied from CRM [id number]. Topic: Clinical - Home Health Verbal Orders >> Feb 20, 2024  2:50 PM Deaijah H wrote: Caller/Agency: Ana Nurse w/ [facility]  Callback Number: [phone] Service Requested: Physical Therapy/Occupational Therapy Frequency: eval Any new concerns about the patient? No

## 2024-02-20 NOTE — Telephone Encounter (Signed)
 That's fine

## 2024-02-20 NOTE — Progress Notes (Addendum)
 Established patient visit   Patient: Jerry Mcpherson   DOB: 04-Apr-1941   83 y.o. Male  MRN: [id number] Visit Date: 02/01/2024  Today's healthcare provider: Nancyann Perry, MD   Chief Complaint  Patient presents with   Follow-up    4 weeks follow-up   Subjective    Discussed the use of AI scribe software for clinical note transcription with the patient, who gave verbal consent to proceed.  History of Present Illness   Jerry Mcpherson is an 83 year old male with hyperthyroidism who presents for follow-up.  He was last seen one month ago when his TSH was 9.77, and he was experiencing extreme fatigue. He was restarted on levothyroxine  25 mcg daily, having previously been on 75 mcg daily, which he ran out of months ago. He reports feeling better.  He is currently taking amiodarone  once daily for a previous heart issue identified at [facility]. He expresses dissatisfaction with previous care at [facility], stating he did not identify his heart problem over a year and a half. He has difficulty reading the small print on medication instructions.  He is concerned about his oxygen supply and medication refills, noting he has two pills left and is due for a refill on the 27th. He has an appointment scheduled at [facility] in [place] for a cardiology consultation at 9:30 AM on Friday, and another appointment for an echocardiogram at [facility] at 10:00 AM the same day. He is unsure about the details and is trying to coordinate transportation.  He mentions a previous heart monitor setup by a doctor at [facility], which should have the results. He is not willing to return to [facility] for cardiology care and prefers to stay with [facility] services.       Medications: Outpatient Medications Prior to Visit  Medication Sig   apixaban  (ELIQUIS ) 2.5 MG TABS tablet Take 1 tablet (2.5 mg total) by mouth 2 (two) times daily.   clopidogrel  (PLAVIX ) 75 MG tablet  Take 75 mg by mouth daily.   ferrous sulfate  325 (65 FE) MG tablet Take 325 mg by mouth daily.   furosemide  (LASIX ) 20 MG tablet Take 2 tablets (40 mg total) by mouth daily as needed for fluid or edema.   hydroxyurea  (HYDREA ) 500 MG capsule Take 500 mg by mouth as directed. 2 tablets 3 days a week, and 1 tablet all other days   Multiple Vitamins-Minerals (MULTIVITAMIN ADULT PO) Take 1 tablet by mouth daily.   valACYclovir  (VALTREX ) 1000 MG tablet TAKE 1 TABLET BY MOUTH TWICE A DAY AS NEEDED   [DISCONTINUED] amiodarone  (PACERONE ) 200 MG tablet Take 2 tablets (400 mg total) by mouth 2 (two) times daily for 10 days, THEN 1 tablet (200 mg total) daily.   [DISCONTINUED] levothyroxine  (SYNTHROID ) 25 MCG tablet TAKE 1 TABLET BY MOUTH EVERY DAY   [DISCONTINUED] oxyCODONE -acetaminophen  (PERCOCET) 10-325 MG tablet Take 1 tablet by mouth every 6 (six) hours as needed. for pain   No facility-administered medications prior to visit.   Review of Systems  Constitutional:  Negative for appetite change, chills and fever.  Respiratory:  Negative for chest tightness, shortness of breath and wheezing.   Cardiovascular:  Negative for chest pain and palpitations.  Gastrointestinal:  Negative for abdominal pain, nausea and vomiting.       Objective    BP 122/70 (BP Location: Left Arm, Patient Position: Sitting, Cuff Size: Normal)   Pulse 89   Resp 16  Wt 142 lb 11.2 oz (64.7 kg)   SpO2 97%   BMI 20.48 kg/m   Physical Exam    General: Appearance:    Well developed, well nourished male in no acute distress  Eyes:    PERRL, conjunctiva/corneas clear, EOM's intact       Lungs:     Clear to auscultation bilaterally, respirations unlabored  Heart:    Normal heart rate. Normal rhythm.  2/6 systolic murmur at right upper sternal border   MS:   All extremities are intact.    Neurologic:   Awake, alert, oriented x 3. No apparent focal neurological defect.           Assessment & Plan         Hyperthyroidism TSH level of 9.77 suggests hypothyroidism. Reduced levothyroxine  dose due to medication shortage. Experiencing fatigue. - Order thyroid  function tests today. - Hold levothyroxine  refill until blood test results are available. - Adjust levothyroxine  dose based on blood test results.  Atrial flutter Currently on amiodarone  initiated during hospitalization for atrial flutter at [facility] in September. Had been followed by cardiology in [facility] but local cardiologist due to transportation issues. - Proceed with echocardiogram on February 03, 2024. - Coordinate cardiology appointment at [facility] in [place]. - Send amiodarone  prescription to [facility].     Chronic back pain secondary  DDD disease and foraminal stenosis.  Fairly well controlled on current oxycodone /apap regiment.      Nancyann Perry, MD  [facility] [phone] (phone) [phone] (fax)  [facility]

## 2024-02-21 ENCOUNTER — Ambulatory Visit: Admitting: Cardiology

## 2024-02-23 NOTE — Telephone Encounter (Signed)
 LM for Ana with [facility] to return call to give verbal orders. Ok for [facility] nurse to give orders when she calls back.

## 2024-02-27 ENCOUNTER — Encounter: Admitting: Physician Assistant

## 2024-02-27 ENCOUNTER — Ambulatory Visit: Payer: Self-pay

## 2024-02-27 DIAGNOSIS — I7025 Atherosclerosis of native arteries of other extremities with ulceration: Secondary | ICD-10-CM | POA: Diagnosis not present

## 2024-02-27 DIAGNOSIS — I129 Hypertensive chronic kidney disease with stage 1 through stage 4 chronic kidney disease, or unspecified chronic kidney disease: Secondary | ICD-10-CM | POA: Diagnosis not present

## 2024-02-27 DIAGNOSIS — E039 Hypothyroidism, unspecified: Secondary | ICD-10-CM | POA: Diagnosis not present

## 2024-02-27 DIAGNOSIS — N189 Chronic kidney disease, unspecified: Secondary | ICD-10-CM | POA: Diagnosis not present

## 2024-02-27 DIAGNOSIS — I739 Peripheral vascular disease, unspecified: Secondary | ICD-10-CM | POA: Diagnosis not present

## 2024-02-27 DIAGNOSIS — I5022 Chronic systolic (congestive) heart failure: Secondary | ICD-10-CM | POA: Diagnosis not present

## 2024-02-27 DIAGNOSIS — I7389 Other specified peripheral vascular diseases: Secondary | ICD-10-CM | POA: Diagnosis not present

## 2024-02-27 DIAGNOSIS — N1832 Chronic kidney disease, stage 3b: Secondary | ICD-10-CM | POA: Diagnosis not present

## 2024-02-27 DIAGNOSIS — I1310 Hypertensive heart and chronic kidney disease without heart failure, with stage 1 through stage 4 chronic kidney disease, or unspecified chronic kidney disease: Secondary | ICD-10-CM | POA: Diagnosis not present

## 2024-02-27 DIAGNOSIS — I4892 Unspecified atrial flutter: Secondary | ICD-10-CM | POA: Diagnosis not present

## 2024-02-27 DIAGNOSIS — B961 Klebsiella pneumoniae [K. pneumoniae] as the cause of diseases classified elsewhere: Secondary | ICD-10-CM | POA: Diagnosis not present

## 2024-02-27 DIAGNOSIS — L97522 Non-pressure chronic ulcer of other part of left foot with fat layer exposed: Secondary | ICD-10-CM | POA: Diagnosis not present

## 2024-02-27 DIAGNOSIS — N390 Urinary tract infection, site not specified: Secondary | ICD-10-CM | POA: Diagnosis not present

## 2024-02-27 DIAGNOSIS — B9689 Other specified bacterial agents as the cause of diseases classified elsewhere: Secondary | ICD-10-CM | POA: Diagnosis not present

## 2024-02-27 NOTE — Telephone Encounter (Signed)
 Ok to hold Farxiga  for now. Sending to PCP for consideration of next steps/alternatives.

## 2024-02-27 NOTE — Telephone Encounter (Signed)
 FYI Only or Action Required?: Action required by provider: clinical question for provider.  Patient was last seen in primary care on 02/01/2024 by [name], MD.  Called Nurse Triage reporting Fatigue.  Symptoms began several days ago.  Interventions attempted: Nothing.  Symptoms are: gradually improving.  Triage Disposition: See Physician Within 24 Hours  Patient/caregiver understands and will follow disposition?: No, wishes to speak with PCP    Copied from CRM #[id number]. Topic: Clinical - Red Word Triage >> Feb 27, 2024  1:23 PM Jerry Mcpherson wrote: Red Word that prompted transfer to Nurse Triage: since starting the farxiga   patient states he is not feeling well and losing focus feeling weak and can't walk or anything. Reason for Disposition  Taking a medicine that could cause dizziness (e.g., blood pressure medications, diuretics)  Answer Assessment - Initial Assessment Questions Additional info: Patient requesting call back from pcp today after 3pm to discuss Farxiga  and if this is causing his intermittent lightheadedness. He does not have vital signs available at this time, states nurse is in his room waiting to check, will call back if not in his normal range. Patient shares he feels he is having side effects from Farxiga  generalized malaise and two nights ago felt lightheaded and like he would pass out, he felt faint again around 2am today. Patient reports he has a podiatry appointment he is preparing for now and requesting call back from pcp or clinic staff after 3pm. Declines to schedule appointment at this time.   1. DESCRIPTION: Describe your dizziness.     Lightheaded  2. LIGHTHEADED: Do you feel lightheaded? (e.g., somewhat faint, woozy, weak upon standing)     Somewhat faint  3. VERTIGO: Do you feel like either you or the room is spinning or tilting? (i.e., vertigo)     Denies  4. SEVERITY: How bad is it?  Do you feel like you are going to faint? Can you  stand and walk?     Resolved at this time. Two nights ago felt light he was going to black out this lasted 30 minutes and then resolved.  5. ONSET:  When did the dizziness begin?     2 nights ago  6. AGGRAVATING FACTORS: Does anything make it worse? (e.g., standing, change in head position)     Feels it started with farxiga   7. HEART RATE: Can you tell me your heart rate? How many beats in 15 seconds?  (Note: Not all patients can do this.)       Nurse is room to check vitals.  8. CAUSE: What do you think is causing the dizziness? (e.g., decreased fluids or food, diarrhea, emotional distress, heat exposure, new medicine, sudden standing, vomiting; unknown)     medication 9. RECURRENT SYMPTOM: Have you had dizziness before? If Yes, ask: When was the last time? What happened that time?     Two nights ago  10. OTHER SYMPTOMS: Do you have any other symptoms? (e.g., fever, chest pain, vomiting, diarrhea, bleeding)       Denies  11. PREGNANCY: Is there any chance you are pregnant? When was your last menstrual period?  Protocols used: Dizziness - Lightheadedness-A-AH

## 2024-02-28 NOTE — Telephone Encounter (Signed)
 Pt. Given message from Dr. Myrla, verbalizes understanding.

## 2024-02-29 ENCOUNTER — Other Ambulatory Visit: Payer: Self-pay | Admitting: Family Medicine

## 2024-02-29 ENCOUNTER — Telehealth: Payer: Self-pay

## 2024-02-29 DIAGNOSIS — B9689 Other specified bacterial agents as the cause of diseases classified elsewhere: Secondary | ICD-10-CM | POA: Diagnosis not present

## 2024-02-29 DIAGNOSIS — G589 Mononeuropathy, unspecified: Secondary | ICD-10-CM

## 2024-02-29 DIAGNOSIS — M5410 Radiculopathy, site unspecified: Secondary | ICD-10-CM

## 2024-02-29 NOTE — Telephone Encounter (Signed)
 Verbal orders given

## 2024-02-29 NOTE — Telephone Encounter (Signed)
 Copied from CRM [id number]. Topic: Clinical - Medical Advice >> Feb 29, 2024 10:48 AM Jerry Mcpherson wrote: Reason for CRM: Patient called in stating that physical therapy person is supposed to meet with him today, patient is unsure for what, patient is wanting to know if he should wait to start physical therapy until after he sees cardiologist, patient is req a call back.

## 2024-02-29 NOTE — Telephone Encounter (Signed)
 Copied from CRM #[id number]. Topic: Clinical - Medication Refill >> Feb 29, 2024  4:21 PM Wess RAMAN wrote: Medication: oxyCODONE -acetaminophen  (PERCOCET) 10-325 MG tablet   Has the patient contacted their pharmacy? No (Agent: If no, request that the patient contact the pharmacy for the refill. If patient does not wish to contact the pharmacy document the reason why and proceed with request.) (Agent: If yes, when and what did the pharmacy advise?)  This is the patient's preferred pharmacy:  [facility] - [place], [place] - [address] [place] [address] Phone: [phone] Fax: [phone]  Is this the correct pharmacy for this prescription? Yes If no, delete pharmacy and type the correct one.   Has the prescription been filled recently? Yes  Is the patient out of the medication? No  Has the patient been seen for an appointment in the last year OR does the patient have an upcoming appointment? Yes  Can we respond through MyChart? No  Agent: Please be advised that Rx refills may take up to 3 business days. We ask that you follow-up with your pharmacy.

## 2024-03-01 ENCOUNTER — Telehealth: Payer: Self-pay

## 2024-03-01 MED ORDER — OXYCODONE-ACETAMINOPHEN 10-325 MG PO TABS: 1.0000 | ORAL_TABLET | Freq: Four times a day (QID) | ORAL | 0 refills | Status: AC | PRN

## 2024-03-01 NOTE — Telephone Encounter (Signed)
 Copied from CRM #[id number]. Topic: Clinical - Prescription Issue >> Mar 01, 2024 12:28 PM Lauren C wrote: Reason for CRM: Pt calling regarding refill request for oxyCODONE -acetaminophen  (PERCOCET) 10-325 MG tablet. He says it is an emergency that he gets it because he has one left and will be going out of town tomorrow.

## 2024-03-01 NOTE — Telephone Encounter (Signed)
 I don't know anything about it. Must have been ordered by someone else.

## 2024-03-02 NOTE — Telephone Encounter (Signed)
Patient advised prescription called in.

## 2024-03-02 NOTE — Telephone Encounter (Signed)
 Called patient regarding this, states the PT did come out and he did decline services, there seems to be some confusion with these orders. Looks like there was an approval for occupational therapy by Dr. Gasper- patient would rather follow up with cardiologist on Monday before starting anything.

## 2024-03-05 ENCOUNTER — Encounter: Payer: Self-pay | Admitting: Cardiology

## 2024-03-05 ENCOUNTER — Ambulatory Visit: Attending: Cardiology | Admitting: Cardiology

## 2024-03-05 VITALS — BP 92/50 | HR 77 | Ht 70.0 in | Wt 145.0 lb

## 2024-03-05 DIAGNOSIS — C678 Malignant neoplasm of overlapping sites of bladder: Secondary | ICD-10-CM | POA: Diagnosis not present

## 2024-03-05 DIAGNOSIS — I483 Typical atrial flutter: Secondary | ICD-10-CM | POA: Diagnosis not present

## 2024-03-05 DIAGNOSIS — C679 Malignant neoplasm of bladder, unspecified: Secondary | ICD-10-CM

## 2024-03-05 DIAGNOSIS — I5020 Unspecified systolic (congestive) heart failure: Secondary | ICD-10-CM

## 2024-03-05 MED ORDER — FUROSEMIDE 20 MG PO TABS: 40.0000 mg | ORAL_TABLET | Freq: Every day | ORAL | Status: DC

## 2024-03-05 NOTE — Patient Instructions (Signed)
 Medication Instructions:  - START taking lasix  daily instead of as needed *If you need a refill on your cardiac medications before your next appointment, please call your pharmacy*  Lab Work: Your provider would like for you to have following labs drawn today CBC, BMP.   If you have labs (blood work) drawn today and your tests are completely normal, you will receive your results only by: MyChart Message (if you have MyChart) OR A paper copy in the mail If you have any lab test that is abnormal or we need to change your treatment, we will call you to review the results.  Testing/Procedures:  Jerry Mcpherson. [facility] [place] [address] [place] [address] Dept: [phone] Loc: [phone]  Jerry Mcpherson  03/05/2024  You are scheduled for a Cardiac Catheterization on Tuesday, December 2 with Dr. Lonni End.  1. Please arrive at the [facility] Entrance of [facility], [address], [place] [address] at 11:00 AM (This is 1 hour(s) prior to your procedure time).  Proceed to the Check-In Desk directly inside the entrance.  Procedure Parking: Use the entrance off of the [address] side of the hospital. Turn right upon entering and follow the driveway to parking that is directly in front of the Heart & Vascular Center. There is no valet parking available at this entrance, however there is an awning directly in front of the Heart & Vascular Center for drop off/ pick up for patients.  Special note: Every effort is made to have your procedure done on time. Please understand that emergencies sometimes delay scheduled procedures.  2. Diet: Nothing to eat after midnight.   3. Hydration: On December 2, you may drink approved liquids (see below) until 2 hours before the procedure with 8 oz of water as your last intake.   List of approved liquids water, clear juice, clear  tea, black coffee, fruit juices, non-citric and without pulp, carbonated beverages, Gatorade, Kool -Aid, plain Jello-O and plain ice popsicles.   5. Medication instructions in preparation for your procedure:   Contrast Allergy: No  Stop taking Eliquis  (Apixiban) on Saturday, November 29.   On the morning of your procedure, take your Aspirin  81 mg and any morning medicines NOT listed above.  You may use sips of water.  6. Plan to go home the same day, you will only stay overnight if medically necessary. 7. Bring a current list of your medications and current insurance cards. 8. You MUST have a responsible person to drive you home. 9. Someone MUST be with you the first 24 hours after you arrive home or your discharge will be delayed. 10. Please wear clothes that are easy to get on and off and wear slip-on shoes.  Thank you for allowing us  to care for you!   -- [facility]   Follow-Up: At [facility], you and your health needs are our priority.  As part of our continuing mission to provide you with exceptional heart care, our providers are all part of one team.  This team includes your primary Cardiologist (physician) and Advanced Practice Providers or APPs (Physician Assistants and Nurse Practitioners) who all work together to provide you with the care you need, when you need it.  Your next appointment:   2 month(s)  Provider:   You may see Dr Darliss or one of the following Advanced Practice Providers  on your designated Care Team:   Lonni Meager, NP Lesley Maffucci, PA-C Bernardino Bring, PA-C Cadence [place], PA-C Tylene Lunch, NP Barnie Hila, NP    We recommend signing up for the patient portal called MyChart.  Sign up information is provided on this After Visit Summary.  MyChart is used to connect with patients for Virtual Visits (Telemedicine).  Patients are able to view lab/test results, encounter notes, upcoming appointments,  etc.  Non-urgent messages can be sent to your provider as well.   To learn more about what you can do with MyChart, go to [email or web address].

## 2024-03-05 NOTE — Progress Notes (Signed)
 Cardiology Office Note:    Date:  03/05/2024   ID:  Jerry Mcpherson, DOB January 26, 1941, MRN [id number]  PCP:  Jerry Nancyann BRAVO, MD   [facility] Providers Cardiologist:  None     Referring MD: Jerry Nancyann BRAVO, MD   Chief Complaint  Patient presents with   New Patient (Initial Visit)    New pt has been doing well with no complaints of chest pain, chest pressure and some  SOB,and light headed , slow healing with wounds,  medciation reviewed verbally with patient    History of Present Illness:    Jerry Mcpherson is a 83 y.o. male with a hx of hypertension, atrial flutter, DVT, CKD, PAD, vertigo presenting to establish care.  Admitted 2 months ago with symptoms of syncope likely secondary to UTI, managed with antibiotics.  Has a history of A-fib/flutter, runs of A-fib noted during admission associated with symptoms of lightheadedness.  Amiodarone  was started to cardiac regimen.  Eliquis  was continued, denies any bleeding issues.  Previously seen at [facility] cardiology, patient would like to establish care with Jerry Mcpherson.  Endorses shortness of breath, dizziness.  Was previously on BP meds this was stopped.  Denies palpitations, tolerating Eliquis  as prescribed, denies any bleeding issues.  Echocardiogram 10/25 EF 35%.  Moderate aortic stenosis.  History of reaction to ACE inhibitors.  Past Medical History:  Diagnosis Date   Acute deep vein thrombosis (DVT) of right femoral vein (HCC) 10/28/2020   US  10/24/2020 [facility] Acute right lower extremity DVT extending from the common femoral vein to  the popliteal vein.     Bladder cancer (HCC)    Genital herpes    Hyperglycemia    Hypertension    Squamous cell carcinoma     Past Surgical History:  Procedure Laterality Date   CATARACT EXTRACTION Left 10/2012   Dr. Lethea; [facility]   CYSTECTOMY W/ URETEROILEAL CONDUIT  07/24/2020   [facility] for Stage IV bladder cancer extending to prostate   EYE SURGERY Left     removed a piece of steel   FEMORAL ARTERY STENT Left 06/16/2022   SFA stenting at [facility]   HERNIA REPAIR  12/08/2006   umbilical hernia, incarderated; Dr. Dessa   PROSTATECTOMY  4/14/2-22   [facility] for Stage IV bladder cancer extending to prostate   ROTATOR CUFF REPAIR     SQUAMOUS CELL CARCINOMA EXCISION  2008   TONSILLECTOMY      Current Medications: Current Meds  Medication Sig   amiodarone  (PACERONE ) 200 MG tablet Take 1 tablet (200 mg total) by mouth daily.   apixaban  (ELIQUIS ) 2.5 MG TABS tablet Take 1 tablet (2.5 mg total) by mouth 2 (two) times daily.   clopidogrel  (PLAVIX ) 75 MG tablet Take 75 mg by mouth daily.   dapagliflozin  propanediol (FARXIGA ) 5 MG TABS tablet Take 1 tablet (5 mg total) by mouth daily.   ferrous sulfate  325 (65 FE) MG tablet Take 325 mg by mouth daily.   hydroxyurea  (HYDREA ) 500 MG capsule Take 500 mg by mouth as directed. 2 tablets 3 days a week, and 1 tablet all other days   levothyroxine  (SYNTHROID ) 50 MCG tablet Take 1 tablet (50 mcg total) by mouth daily. PLEASE NOTE CHANGE TO AND CANCEL ANY REMAINING REFILLS OF THE 25s   Multiple Vitamins-Minerals (MULTIVITAMIN ADULT PO) Take 1 tablet by mouth daily.   oxyCODONE -acetaminophen  (PERCOCET) 10-325 MG tablet Take 1 tablet by mouth every 6 (six) hours as needed. for pain  valACYclovir  (VALTREX ) 1000 MG tablet TAKE 1 TABLET BY MOUTH TWICE A DAY AS NEEDED   [DISCONTINUED] furosemide  (LASIX ) 20 MG tablet Take 2 tablets (40 mg total) by mouth daily as needed for fluid or edema.     Allergies:   Losartan potassium, Shellfish allergy, Shellfish protein-containing drug products, Doxycycline , Ultram  [tramadol hcl], Celecoxib, Codeine, Cyclobenzaprine, and Tramadol hcl   Social History   Socioeconomic History   Marital status: Divorced    Spouse name: Not on file   Number of children: 1   Years of education: Not on file   Highest education level: Some college, no degree  Occupational History    Occupation: Retired   Occupation: sales executive part time  Tobacco Use   Smoking status: Former    Current packs/day: 0.00    Average packs/day: 3.0 packs/day for 15.0 years (45.0 ttl pk-yrs)    Types: Cigarettes    Start date: 04/12/1953    Quit date: 04/12/1968    Years since quitting: 55.9   Smokeless tobacco: Never  Vaping Use   Vaping status: Never Used  Substance and Sexual Activity   Alcohol use: No    Alcohol/week: 0.0 standard drinks of alcohol   Drug use: No   Sexual activity: Not on file  Other Topics Concern   Not on file  Social History Narrative   Not on file   Social Drivers of Mcpherson   Financial Resource Strain: Low Risk  (09/16/2023)   Received from [facility]   Overall Financial Resource Strain (CARDIA)    Difficulty of Paying Living Expenses: Not hard at all  Recent Concern: Financial Resource Strain - Medium Risk (08/10/2023)   Overall Financial Resource Strain (CARDIA)    Difficulty of Paying Living Expenses: Somewhat hard  Food Insecurity: Food Insecurity Present (01/05/2024)   Hunger Vital Sign    Worried About Running Out of Food in the Last Year: Sometimes true    Ran Out of Food in the Last Year: Sometimes true  Transportation Needs: Unmet Transportation Needs (01/05/2024)   PRAPARE - Transportation    Lack of Transportation (Medical): Yes    Lack of Transportation (Non-Medical): Yes  Physical Activity: Inactive (08/10/2023)   Exercise Vital Sign    Days of Exercise per Week: 0 days    Minutes of Exercise per Session: 0 min  Stress: No Stress Concern Present (08/10/2023)   [employer] of Occupational Mcpherson - Occupational Stress Questionnaire    Feeling of Stress : Only a little  Social Connections: Socially Isolated (12/17/2023)   Social Connection and Isolation Panel    Frequency of Communication with Friends and Family: More than three times a week    Frequency of Social Gatherings with Friends and Family: More than three  times a week    Attends Religious Services: Never    Database Administrator or Organizations: No    Attends Engineer, Structural: Never    Marital Status: Divorced     Family History: The patient's family history includes COPD in his sister; Cancer in his father; Congestive Heart Failure in his brother; Heart attack in his mother; Hypertension in his mother; Stroke in his father.  ROS:   Please see the history of present illness.     All other systems reviewed and are negative.  EKGs/Labs/Other Studies Reviewed:    The following studies were reviewed today:  EKG Interpretation Date/Time:  Monday March 05 2024 10:59:59 EST Ventricular Rate:  77 PR Interval:  200 QRS Duration:  104 QT Interval:  422 QTC Calculation: 477 R Axis:   205  Text Interpretation: Normal sinus rhythm Right superior axis deviation Possible Right ventricular hypertrophy Confirmed by Darliss Rogue ([id number]) on 03/05/2024 11:25:43 AM    Recent Labs: 12/09/2023: BNP 1,624.8 12/20/2023: Magnesium 2.6 01/04/2024: ALT 8; BUN 39; Creatinine, Ser 1.96; Hemoglobin 15.3; Platelets 663; Potassium 4.8; Sodium 140 02/01/2024: TSH 10.800  Recent Lipid Panel    Component Value Date/Time   CHOL 135 08/26/2017 1144   TRIG 183 (H) 08/26/2017 1144   HDL 30 (L) 08/26/2017 1144   CHOLHDL 4.5 08/26/2017 1144   LDLCALC 68 08/26/2017 1144     Risk Assessment/Calculations:             Physical Exam:    VS:  BP (!) 92/50 (BP Location: Left Arm, Patient Position: Sitting, Cuff Size: Normal)   Pulse 77   Ht 5' 10 (1.778 m)   Wt 145 lb (65.8 kg)   SpO2 99%   BMI 20.81 kg/m     Wt Readings from Last 3 Encounters:  03/05/24 145 lb (65.8 kg)  02/01/24 142 lb 11.2 oz (64.7 kg)  01/17/24 137 lb (62.1 kg)     GEN:  Well nourished, well developed in no acute distress HEENT: Normal NECK: No JVD; No carotid bruits CARDIAC: RRR, 2/6 systolic murmur RESPIRATORY:  Clear to auscultation without rales,  wheezing or rhonchi  ABDOMEN: Soft, non-tender, non-distended MUSCULOSKELETAL:  No edema; left foot in dressing, SKIN: Warm and dry NEUROLOGIC:  Alert and oriented x 3 PSYCHIATRIC:  Normal affect   ASSESSMENT:    1. Typical atrial flutter (HCC)   2. HFrEF (heart failure with reduced ejection fraction) (HCC)   3. Malignant neoplasm of overlapping sites of bladder (HCC)   4. Malignant neoplasm of urinary bladder, unspecified site [facility])    PLAN:    In order of problems listed above:  Atrial flutter, currently in sinus rhythm.  Continue amiodarone  200 mg daily, Eliquis  2.5 mg twice daily. HFrEF, EF 35%.  Denies prior smoking history.  Patient wants everything done/invasive test/procedure for workup.  Endorses shortness of breath with exertion.  Plan right and left heart cath, refer to advanced heart failure clinic.  Low BP preventing GDMT.  Continue Farxiga , Lasix  20 mg daily.  Follow-up after cardiac testing      Informed Consent   Shared Decision Making/Informed Consent The risks [stroke (1 in 1000), death (1 in 1000), kidney failure [usually temporary] (1 in 500), bleeding (1 in 200), allergic reaction [possibly serious] (1 in 200)], benefits (diagnostic support and management of coronary artery disease) and alternatives of a cardiac catheterization were discussed in detail with Mr. Borowiak and he is willing to proceed.       Medication Adjustments/Labs and Tests Ordered: Current medicines are reviewed at length with the patient today.  Concerns regarding medicines are outlined above.  Orders Placed This Encounter  Procedures   CBC   Basic metabolic panel with GFR   AMB referral to [facility]   Ambulatory referral to Hematology / Oncology   EKG 12-Lead   Meds ordered this encounter  Medications   furosemide  (LASIX ) 20 MG tablet    Sig: Take 2 tablets (40 mg total) by mouth daily.    MEDS TO BED    Patient Instructions  Medication Instructions:  - START taking  lasix  daily instead of as needed *If you need a refill on your cardiac medications before your next  appointment, please call your pharmacy*  Lab Work: Your provider would like for you to have following labs drawn today CBC, BMP.   If you have labs (blood work) drawn today and your tests are completely normal, you will receive your results only by: MyChart Message (if you have MyChart) OR A paper copy in the mail If you have any lab test that is abnormal or we need to change your treatment, we will call you to review the results.  Testing/Procedures:  [facility] OF [facility] [place] [address] [place] [address] Dept: [phone] Loc: [phone]  [place] Grayer Madkins  03/05/2024  You are scheduled for a Cardiac Catheterization on Tuesday, December 2 with Dr. Lonni End.  1. Please arrive at the [facility] Entrance of [facility], [address], [place] [address] at 11:00 AM (This is 1 hour(s) prior to your procedure time).  Proceed to the Check-In Desk directly inside the entrance.  Procedure Parking: Use the entrance off of the [address] [facility] [address] side of the hospital. Turn right upon entering and follow the driveway to parking that is directly in front of the [facility] Vascular Center. There is no valet parking available at this entrance, however there is an awning directly in front of the Heart & Vascular Center for drop off/ pick up for patients.  Special note: Every effort is made to have your procedure done on time. Please understand that emergencies sometimes delay scheduled procedures.  2. Diet: Nothing to eat after midnight.   3. Hydration: On December 2, you may drink approved liquids (see below) until 2 hours before the procedure with 8 oz of water as your last intake.   List of approved liquids water, clear juice, clear tea, black coffee, fruit juices, non-citric  and without pulp, carbonated beverages, Gatorade, Kool -Aid, plain Jello-O and plain ice popsicles.   5. Medication instructions in preparation for your procedure:   Contrast Allergy: No  Stop taking Eliquis  (Apixiban) on Saturday, November 29.   On the morning of your procedure, take your Aspirin  81 mg and any morning medicines NOT listed above.  You may use sips of water.  6. Plan to go home the same day, you will only stay overnight if medically necessary. 7. Bring a current list of your medications and current insurance cards. 8. You MUST have a responsible person to drive you home. 9. Someone MUST be with you the first 24 hours after you arrive home or your discharge will be delayed. 10. Please wear clothes that are easy to get on and off and wear slip-on shoes.  Thank you for allowing us  to care for you!   -- [facility]   Follow-Up: At [facility], you and your Mcpherson needs are our priority.  As part of our continuing mission to provide you with exceptional heart care, our providers are all part of one team.  This team includes your primary Cardiologist (physician) and Advanced Practice Providers or APPs (Physician Assistants and Nurse Practitioners) who all work together to provide you with the care you need, when you need it.  Your next appointment:   2 month(s)  Provider:   You may see Dr Darliss or one of the following Advanced Practice Providers on your designated Care Team:   Lonni Meager, NP Lesley Maffucci, PA-C Bernardino Bring, PA-C Cadence Delaware, PA-C Tylene Lunch, NP Barnie Hila,  NP    We recommend signing up for the patient portal called MyChart.  Sign up information is provided on this After Visit Summary.  MyChart is used to connect with patients for Virtual Visits (Telemedicine).  Patients are able to view lab/test results, encounter notes, upcoming appointments, etc.  Non-urgent messages can be sent to  your provider as well.   To learn more about what you can do with MyChart, go to [email or web address].          Signed, Redell Cave, MD  03/05/2024 12:52 PM    [facility]

## 2024-03-05 NOTE — H&P (View-Only) (Signed)
 Cardiology Office Note:    Date:  03/05/2024   ID:  Jerry Mcpherson, DOB January 26, 1941, MRN [id number]  PCP:  Jerry Nancyann BRAVO, MD   [facility] Providers Cardiologist:  None     Referring MD: Jerry Nancyann BRAVO, MD   Chief Complaint  Patient presents with   New Patient (Initial Visit)    New pt has been doing well with no complaints of chest pain, chest pressure and some  SOB,and light headed , slow healing with wounds,  medciation reviewed verbally with patient    History of Present Illness:    Jerry Mcpherson is a 83 y.o. male with a hx of hypertension, atrial flutter, DVT, CKD, PAD, vertigo presenting to establish care.  Admitted 2 months ago with symptoms of syncope likely secondary to UTI, managed with antibiotics.  Has a history of A-fib/flutter, runs of A-fib noted during admission associated with symptoms of lightheadedness.  Amiodarone  was started to cardiac regimen.  Eliquis  was continued, denies any bleeding issues.  Previously seen at [facility] cardiology, patient would like to establish care with [name] health.  Endorses shortness of breath, dizziness.  Was previously on BP meds this was stopped.  Denies palpitations, tolerating Eliquis  as prescribed, denies any bleeding issues.  Echocardiogram 10/25 EF 35%.  Moderate aortic stenosis.  History of reaction to ACE inhibitors.  Past Medical History:  Diagnosis Date   Acute deep vein thrombosis (DVT) of right femoral vein (HCC) 10/28/2020   US  10/24/2020 [facility] Acute right lower extremity DVT extending from the common femoral vein to  the popliteal vein.     Bladder cancer (HCC)    Genital herpes    Hyperglycemia    Hypertension    Squamous cell carcinoma     Past Surgical History:  Procedure Laterality Date   CATARACT EXTRACTION Left 10/2012   Dr. Lethea; [facility]   CYSTECTOMY W/ URETEROILEAL CONDUIT  07/24/2020   [facility] for Stage IV bladder cancer extending to prostate   EYE SURGERY Left     removed a piece of steel   FEMORAL ARTERY STENT Left 06/16/2022   SFA stenting at [facility]   HERNIA REPAIR  12/08/2006   umbilical hernia, incarderated; Dr. Dessa   PROSTATECTOMY  4/14/2-22   [facility] for Stage IV bladder cancer extending to prostate   ROTATOR CUFF REPAIR     SQUAMOUS CELL CARCINOMA EXCISION  2008   TONSILLECTOMY      Current Medications: Current Meds  Medication Sig   amiodarone  (PACERONE ) 200 MG tablet Take 1 tablet (200 mg total) by mouth daily.   apixaban  (ELIQUIS ) 2.5 MG TABS tablet Take 1 tablet (2.5 mg total) by mouth 2 (two) times daily.   clopidogrel  (PLAVIX ) 75 MG tablet Take 75 mg by mouth daily.   dapagliflozin  propanediol (FARXIGA ) 5 MG TABS tablet Take 1 tablet (5 mg total) by mouth daily.   ferrous sulfate  325 (65 FE) MG tablet Take 325 mg by mouth daily.   hydroxyurea  (HYDREA ) 500 MG capsule Take 500 mg by mouth as directed. 2 tablets 3 days a week, and 1 tablet all other days   levothyroxine  (SYNTHROID ) 50 MCG tablet Take 1 tablet (50 mcg total) by mouth daily. PLEASE NOTE CHANGE TO AND CANCEL ANY REMAINING REFILLS OF THE 25s   Multiple Vitamins-Minerals (MULTIVITAMIN ADULT PO) Take 1 tablet by mouth daily.   oxyCODONE -acetaminophen  (PERCOCET) 10-325 MG tablet Take 1 tablet by mouth every 6 (six) hours as needed. for pain  valACYclovir  (VALTREX ) 1000 MG tablet TAKE 1 TABLET BY MOUTH TWICE A DAY AS NEEDED   [DISCONTINUED] furosemide  (LASIX ) 20 MG tablet Take 2 tablets (40 mg total) by mouth daily as needed for fluid or edema.     Allergies:   Losartan potassium, Shellfish allergy, Shellfish protein-containing drug products, Doxycycline , Ultram  [tramadol hcl], Celecoxib, Codeine, Cyclobenzaprine, and Tramadol hcl   Social History   Socioeconomic History   Marital status: Divorced    Spouse name: Not on file   Number of children: 1   Years of education: Not on file   Highest education level: Some college, no degree  Occupational History    Occupation: Retired   Occupation: sales executive part time  Tobacco Use   Smoking status: Former    Current packs/day: 0.00    Average packs/day: 3.0 packs/day for 15.0 years (45.0 ttl pk-yrs)    Types: Cigarettes    Start date: 04/12/1953    Quit date: 04/12/1968    Years since quitting: 55.9   Smokeless tobacco: Never  Vaping Use   Vaping status: Never Used  Substance and Sexual Activity   Alcohol use: No    Alcohol/week: 0.0 standard drinks of alcohol   Drug use: No   Sexual activity: Not on file  Other Topics Concern   Not on file  Social History Narrative   Not on file   Social Drivers of Health   Financial Resource Strain: Low Risk  (09/16/2023)   Received from [facility]   Overall Financial Resource Strain (CARDIA)    Difficulty of Paying Living Expenses: Not hard at all  Recent Concern: Financial Resource Strain - Medium Risk (08/10/2023)   Overall Financial Resource Strain (CARDIA)    Difficulty of Paying Living Expenses: Somewhat hard  Food Insecurity: Food Insecurity Present (01/05/2024)   Hunger Vital Sign    Worried About Running Out of Food in the Last Year: Sometimes true    Ran Out of Food in the Last Year: Sometimes true  Transportation Needs: Unmet Transportation Needs (01/05/2024)   PRAPARE - Transportation    Lack of Transportation (Medical): Yes    Lack of Transportation (Non-Medical): Yes  Physical Activity: Inactive (08/10/2023)   Exercise Vital Sign    Days of Exercise per Week: 0 days    Minutes of Exercise per Session: 0 min  Stress: No Stress Concern Present (08/10/2023)   [employer] of Occupational Health - Occupational Stress Questionnaire    Feeling of Stress : Only a little  Social Connections: Socially Isolated (12/17/2023)   Social Connection and Isolation Panel    Frequency of Communication with Friends and Family: More than three times a week    Frequency of Social Gatherings with Friends and Family: More than three  times a week    Attends Religious Services: Never    Database Administrator or Organizations: No    Attends Engineer, Structural: Never    Marital Status: Divorced     Family History: The patient's family history includes COPD in his sister; Cancer in his father; Congestive Heart Failure in his brother; Heart attack in his mother; Hypertension in his mother; Stroke in his father.  ROS:   Please see the history of present illness.     All other systems reviewed and are negative.  EKGs/Labs/Other Studies Reviewed:    The following studies were reviewed today:  EKG Interpretation Date/Time:  Monday March 05 2024 10:59:59 EST Ventricular Rate:  77 PR Interval:  200 QRS Duration:  104 QT Interval:  422 QTC Calculation: 477 R Axis:   205  Text Interpretation: Normal sinus rhythm Right superior axis deviation Possible Right ventricular hypertrophy Confirmed by Darliss Rogue ([id number]) on 03/05/2024 11:25:43 AM    Recent Labs: 12/09/2023: BNP 1,624.8 12/20/2023: Magnesium 2.6 01/04/2024: ALT 8; BUN 39; Creatinine, Ser 1.96; Hemoglobin 15.3; Platelets 663; Potassium 4.8; Sodium 140 02/01/2024: TSH 10.800  Recent Lipid Panel    Component Value Date/Time   CHOL 135 08/26/2017 1144   TRIG 183 (H) 08/26/2017 1144   HDL 30 (L) 08/26/2017 1144   CHOLHDL 4.5 08/26/2017 1144   LDLCALC 68 08/26/2017 1144     Risk Assessment/Calculations:             Physical Exam:    VS:  BP (!) 92/50 (BP Location: Left Arm, Patient Position: Sitting, Cuff Size: Normal)   Pulse 77   Ht 5' 10 (1.778 m)   Wt 145 lb (65.8 kg)   SpO2 99%   BMI 20.81 kg/m     Wt Readings from Last 3 Encounters:  03/05/24 145 lb (65.8 kg)  02/01/24 142 lb 11.2 oz (64.7 kg)  01/17/24 137 lb (62.1 kg)     GEN:  Well nourished, well developed in no acute distress HEENT: Normal NECK: No JVD; No carotid bruits CARDIAC: RRR, 2/6 systolic murmur RESPIRATORY:  Clear to auscultation without rales,  wheezing or rhonchi  ABDOMEN: Soft, non-tender, non-distended MUSCULOSKELETAL:  No edema; left foot in dressing, SKIN: Warm and dry NEUROLOGIC:  Alert and oriented x 3 PSYCHIATRIC:  Normal affect   ASSESSMENT:    1. Typical atrial flutter (HCC)   2. HFrEF (heart failure with reduced ejection fraction) (HCC)   3. Malignant neoplasm of overlapping sites of bladder (HCC)   4. Malignant neoplasm of urinary bladder, unspecified site [facility])    PLAN:    In order of problems listed above:  Atrial flutter, currently in sinus rhythm.  Continue amiodarone  200 mg daily, Eliquis  2.5 mg twice daily. HFrEF, EF 35%.  Denies prior smoking history.  Patient wants everything done/invasive test/procedure for workup.  Endorses shortness of breath with exertion.  Plan right and left heart cath, refer to advanced heart failure clinic.  Low BP preventing GDMT.  Continue Farxiga , Lasix  20 mg daily.  Follow-up after cardiac testing      Informed Consent   Shared Decision Making/Informed Consent The risks [stroke (1 in 1000), death (1 in 1000), kidney failure [usually temporary] (1 in 500), bleeding (1 in 200), allergic reaction [possibly serious] (1 in 200)], benefits (diagnostic support and management of coronary artery disease) and alternatives of a cardiac catheterization were discussed in detail with Mr. Cromie and he is willing to proceed.       Medication Adjustments/Labs and Tests Ordered: Current medicines are reviewed at length with the patient today.  Concerns regarding medicines are outlined above.  Orders Placed This Encounter  Procedures   CBC   Basic metabolic panel with GFR   AMB referral to [facility]   Ambulatory referral to Hematology / Oncology   EKG 12-Lead   Meds ordered this encounter  Medications   furosemide  (LASIX ) 20 MG tablet    Sig: Take 2 tablets (40 mg total) by mouth daily.    MEDS TO BED    Patient Instructions  Medication Instructions:  - START taking  lasix  daily instead of as needed *If you need a refill on your cardiac medications before your next  appointment, please call your pharmacy*  Lab Work: Your provider would like for you to have following labs drawn today CBC, BMP.   If you have labs (blood work) drawn today and your tests are completely normal, you will receive your results only by: MyChart Message (if you have MyChart) OR A paper copy in the mail If you have any lab test that is abnormal or we need to change your treatment, we will call you to review the results.  Testing/Procedures:  [place] [facility] OF [facility] [place] [address] [place] [address] Dept: [phone] Loc: [phone]  [place] Grayer Madkins  03/05/2024  You are scheduled for a Cardiac Catheterization on Tuesday, December 2 with Dr. Lonni End.  1. Please arrive at the [facility] & Vascular Center Entrance of [facility], [address], [place] [address] at 11:00 AM (This is 1 hour(s) prior to your procedure time).  Proceed to the Check-In Desk directly inside the entrance.  Procedure Parking: Use the entrance off of the [address] side of the hospital. Turn right upon entering and follow the driveway to parking that is directly in front of the Heart & Vascular Center. There is no valet parking available at this entrance, however there is an awning directly in front of the Heart & Vascular Center for drop off/ pick up for patients.  Special note: Every effort is made to have your procedure done on time. Please understand that emergencies sometimes delay scheduled procedures.  2. Diet: Nothing to eat after midnight.   3. Hydration: On December 2, you may drink approved liquids (see below) until 2 hours before the procedure with 8 oz of water as your last intake.   List of approved liquids water, clear juice, clear tea, black coffee, fruit juices, non-citric  and without pulp, carbonated beverages, Gatorade, Kool -Aid, plain Jello-O and plain ice popsicles.   5. Medication instructions in preparation for your procedure:   Contrast Allergy: No  Stop taking Eliquis  (Apixiban) on Saturday, November 29.   On the morning of your procedure, take your Aspirin  81 mg and any morning medicines NOT listed above.  You may use sips of water.  6. Plan to go home the same day, you will only stay overnight if medically necessary. 7. Bring a current list of your medications and current insurance cards. 8. You MUST have a responsible person to drive you home. 9. Someone MUST be with you the first 24 hours after you arrive home or your discharge will be delayed. 10. Please wear clothes that are easy to get on and off and wear slip-on shoes.  Thank you for allowing us  to care for you!   -- [facility]   Follow-Up: At [facility], you and your health needs are our priority.  As part of our continuing mission to provide you with exceptional heart care, our providers are all part of one team.  This team includes your primary Cardiologist (physician) and Advanced Practice Providers or APPs (Physician Assistants and Nurse Practitioners) who all work together to provide you with the care you need, when you need it.  Your next appointment:   2 month(s)  Provider:   You may see Dr Darliss or one of the following Advanced Practice Providers on your designated Care Team:   Lonni Meager, NP Lesley Maffucci, PA-C Bernardino Bring, PA-C Cadence Delaware, PA-C Tylene Lunch, NP Barnie Hila,  NP    We recommend signing up for the patient portal called MyChart.  Sign up information is provided on this After Visit Summary.  MyChart is used to connect with patients for Virtual Visits (Telemedicine).  Patients are able to view lab/test results, encounter notes, upcoming appointments, etc.  Non-urgent messages can be sent to  your provider as well.   To learn more about what you can do with MyChart, go to [email or web address].          Signed, Redell Cave, MD  03/05/2024 12:52 PM    [facility]

## 2024-03-06 ENCOUNTER — Ambulatory Visit: Payer: Self-pay | Admitting: Cardiology

## 2024-03-06 ENCOUNTER — Other Ambulatory Visit: Payer: Self-pay | Admitting: Cardiology

## 2024-03-06 ENCOUNTER — Encounter: Payer: Self-pay | Admitting: Oncology

## 2024-03-06 DIAGNOSIS — D492 Neoplasm of unspecified behavior of bone, soft tissue, and skin: Secondary | ICD-10-CM | POA: Diagnosis not present

## 2024-03-06 DIAGNOSIS — L570 Actinic keratosis: Secondary | ICD-10-CM | POA: Diagnosis not present

## 2024-03-06 LAB — CBC
Hematocrit: 48.5 % (ref 37.5–51.0)
Hemoglobin: 14.6 g/dL (ref 13.0–17.7)
MCH: 25.7 pg — ABNORMAL LOW (ref 26.6–33.0)
MCHC: 30.1 g/dL — ABNORMAL LOW (ref 31.5–35.7)
MCV: 85 fL (ref 79–97)
Platelets: 638 x10E3/uL — ABNORMAL HIGH (ref 150–450)
RBC: 5.69 x10E6/uL (ref 4.14–5.80)
RDW: 19.2 % — ABNORMAL HIGH (ref 11.6–15.4)
WBC: 21.8 x10E3/uL (ref 3.4–10.8)

## 2024-03-06 LAB — BASIC METABOLIC PANEL WITH GFR
BUN/Creatinine Ratio: 19 (ref 10–24)
BUN: 45 mg/dL — ABNORMAL HIGH (ref 8–27)
CO2: 18 mmol/L — ABNORMAL LOW (ref 20–29)
Calcium: 9.1 mg/dL (ref 8.6–10.2)
Chloride: 105 mmol/L (ref 96–106)
Creatinine, Ser: 2.38 mg/dL — ABNORMAL HIGH (ref 0.76–1.27)
Glucose: 81 mg/dL (ref 70–99)
Potassium: 5.2 mmol/L (ref 3.5–5.2)
Sodium: 141 mmol/L (ref 134–144)
eGFR: 26 mL/min/1.73 — ABNORMAL LOW (ref >59)

## 2024-03-06 NOTE — Addendum Note (Signed)
 Addended by: DARLISS ROGUE on: 03/06/2024 10:04 AM   Modules accepted: Orders

## 2024-03-07 ENCOUNTER — Encounter: Payer: Self-pay | Admitting: Family Medicine

## 2024-03-07 ENCOUNTER — Ambulatory Visit (INDEPENDENT_AMBULATORY_CARE_PROVIDER_SITE_OTHER): Admitting: Family Medicine

## 2024-03-07 VITALS — BP 119/77 | HR 82 | Ht 67.0 in | Wt 146.8 lb

## 2024-03-07 DIAGNOSIS — I4892 Unspecified atrial flutter: Secondary | ICD-10-CM

## 2024-03-07 DIAGNOSIS — N1832 Chronic kidney disease, stage 3b: Secondary | ICD-10-CM

## 2024-03-07 DIAGNOSIS — E039 Hypothyroidism, unspecified: Secondary | ICD-10-CM | POA: Diagnosis not present

## 2024-03-07 DIAGNOSIS — I5020 Unspecified systolic (congestive) heart failure: Secondary | ICD-10-CM | POA: Diagnosis not present

## 2024-03-07 NOTE — Patient Instructions (Signed)
 Jerry Mcpherson  Please review the attached list of medications and notify my office if there are any errors.   . Please bring all of your medications to every appointment so we can make sure that our medication list is the same as yours.

## 2024-03-07 NOTE — Progress Notes (Signed)
 Established patient visit   Patient: Jerry Mcpherson   DOB: 1941-02-20   83 y.o. Male  MRN: [id number] Visit Date: 03/07/2024  Today's healthcare provider: Nancyann Perry, MD   Chief Complaint  Patient presents with   Follow-up    4 weeks follow-up   Subjective    Discussed the use of AI scribe software for clinical note transcription with the patient, who gave verbal consent to proceed.  History of Present Illness   Jerry Mcpherson is an 83 year old male with coronary artery disease and bladder cancer who presents for follow-up on his cardiac and oncological care.  He recently consulted with a cardiologist regarding the possibility of arterial blockage and the need for further testing, with an appointment scheduled for January 24th. He experiences significant shortness of breath and fatigue. He describes himself as 'very unstable' and requires a hand walker for mobility, although he often does not use it. He struggles with stairs unless he has something to hold onto.  He has a history of bladder cancer, having undergone surgery to remove his bladder over two years ago. He has an upcoming appointment at the cancer center on December 4th to follow up on his high white blood cell counts and previous cancer treatment. He is unsure of the specific reason for this appointment but acknowledges the need for monitoring due to his past cancer history.  He mentions a previous encounter with a kidney specialist at [facility] but no longer follows up there, preferring local care in [place]. He was informed of a bladder infection despite not having a bladder, which he found confusing. He is awaiting a new appointment with a local kidney doctor.  He is currently on thyroid  medication, which was recently increased.     Last thyroid  functions Lab Results  Component Value Date   TSH 10.800 (H) 02/01/2024   T4TOTAL 5.8 01/04/2024   FREET4 1.21 02/01/2024   Lab Results  Component Value  Date   NA 141 03/05/2024   K 5.2 03/05/2024   CREATININE 2.38 (H) 03/05/2024   EGFR 26 (L) 03/05/2024   GLUCOSE 81 03/05/2024   Lab Results  Component Value Date   CHOL 135 08/26/2017   HDL 30 (L) 08/26/2017   LDLCALC 68 08/26/2017   TRIG 183 (H) 08/26/2017   CHOLHDL 4.5 08/26/2017     Medications: Outpatient Medications Prior to Visit  Medication Sig   amiodarone  (PACERONE ) 200 MG tablet Take 1 tablet (200 mg total) by mouth daily.   apixaban  (ELIQUIS ) 2.5 MG TABS tablet Take 1 tablet (2.5 mg total) by mouth 2 (two) times daily.   apixaban  (ELIQUIS ) 5 MG TABS tablet Take 5 mg by mouth daily.   clopidogrel  (PLAVIX ) 75 MG tablet Take 75 mg by mouth daily.   ferrous sulfate  325 (65 FE) MG tablet Take 325 mg by mouth daily.   furosemide  (LASIX ) 20 MG tablet Take 2 tablets (40 mg total) by mouth daily. (Patient taking differently: Take 20 mg by mouth daily.)   hydroxyurea  (HYDREA ) 500 MG capsule Take 500 mg by mouth daily.   levothyroxine  (SYNTHROID ) 50 MCG tablet Take 1 tablet (50 mcg total) by mouth daily. PLEASE NOTE CHANGE TO AND CANCEL ANY REMAINING REFILLS OF THE 25s   metoprolol  succinate (TOPROL -XL) 50 MG 24 hr tablet Take 50 mg by mouth.   Multiple Vitamins-Minerals (MULTIVITAMIN ADULT PO) Take 1 tablet by mouth daily.   oxyCODONE -acetaminophen  (PERCOCET) 10-325 MG tablet Take 1 tablet  by mouth every 6 (six) hours as needed. for pain   sodium bicarbonate 650 MG tablet Take 650 mg by mouth daily.   valACYclovir  (VALTREX ) 1000 MG tablet TAKE 1 TABLET BY MOUTH TWICE A DAY AS NEEDED   No facility-administered medications prior to visit.   Review of Systems     Objective    BP 119/77 (BP Location: Right Arm, Patient Position: Sitting, Cuff Size: Normal)   Pulse 82   Ht 5' 7 (1.702 m)   Wt 146 lb 12.8 oz (66.6 kg)   SpO2 100%   BMI 22.99 kg/m   Physical Exam   General: Appearance:    Well developed, well nourished male in no acute distress  Eyes:    PERRL,  conjunctiva/corneas clear, EOM's intact       Lungs:     Clear to auscultation bilaterally, respirations unlabored  Heart:    Normal heart rate. Normal rhythm.  2/6 systolic murmur at right upper sternal border   MS:   All extremities are intact.    Neurologic:   Awake, alert, oriented x 3. No apparent focal neurological defect.        Assessment & Plan    1. CKD stage 3b, GFR 30-44 ml/min (HCC) (Primary) Previously followed at [facility] nephrology but not seen there since 2024 and he does not want to go back to [facility] - Ambulatory referral to local Nephrology  2. Hypothyroidism, unspecified type Doing well with recent increase levothyroxine  to 50mcg daily. Will recheck thyroid  functions at follow up appointment in 6 weeks.   3. HFrEF (heart failure with reduced ejection fraction) (HCC) Now established with local cardiologist and having cath in December. Continue current medications.    4. Atrial flutter, unspecified type (HCC) Continue DOAC and scheduled follow up with local cardiologist.    Return in about 6 weeks (around 04/18/2024).      Nancyann Perry, MD  [facility] [phone] (phone) [phone] (fax)  [facility]

## 2024-03-10 ENCOUNTER — Other Ambulatory Visit: Payer: Self-pay | Admitting: Family Medicine

## 2024-03-10 DIAGNOSIS — I4892 Unspecified atrial flutter: Secondary | ICD-10-CM | POA: Diagnosis not present

## 2024-03-10 DIAGNOSIS — N1832 Chronic kidney disease, stage 3b: Secondary | ICD-10-CM | POA: Diagnosis not present

## 2024-03-10 DIAGNOSIS — N390 Urinary tract infection, site not specified: Secondary | ICD-10-CM | POA: Diagnosis not present

## 2024-03-10 DIAGNOSIS — B9689 Other specified bacterial agents as the cause of diseases classified elsewhere: Secondary | ICD-10-CM | POA: Diagnosis not present

## 2024-03-10 DIAGNOSIS — E039 Hypothyroidism, unspecified: Secondary | ICD-10-CM | POA: Diagnosis not present

## 2024-03-10 DIAGNOSIS — I739 Peripheral vascular disease, unspecified: Secondary | ICD-10-CM | POA: Diagnosis not present

## 2024-03-10 DIAGNOSIS — I1310 Hypertensive heart and chronic kidney disease without heart failure, with stage 1 through stage 4 chronic kidney disease, or unspecified chronic kidney disease: Secondary | ICD-10-CM | POA: Diagnosis not present

## 2024-03-10 DIAGNOSIS — I5022 Chronic systolic (congestive) heart failure: Secondary | ICD-10-CM | POA: Diagnosis not present

## 2024-03-10 DIAGNOSIS — B961 Klebsiella pneumoniae [K. pneumoniae] as the cause of diseases classified elsewhere: Secondary | ICD-10-CM | POA: Diagnosis not present

## 2024-03-12 ENCOUNTER — Telehealth: Payer: Self-pay | Admitting: Family Medicine

## 2024-03-12 ENCOUNTER — Ambulatory Visit: Admitting: Family Medicine

## 2024-03-12 NOTE — Telephone Encounter (Signed)
 Copied from CRM #[id number]. Topic: Clinical - Medication Refill >> Mar 12, 2024 11:44 AM Myrick T wrote:  Patient took his last dosage on Friday  Medication: sodium bicarbonate 650 MG tablet  Has the patient contacted their pharmacy? Yes  This is the patient's preferred pharmacy:   [facility] [phone] [place], [place] - [address] [place] [address] Phone: [phone] Fax: [phone]  Is this the correct pharmacy for this prescription? Yes  Has the prescription been filled recently? Yes  Is the patient out of the medication? Yes  Has the patient been seen for an appointment in the last year OR does the patient have an upcoming appointment? Yes  Can we respond through MyChart? No  Agent: Please be advised that Rx refills may take up to 3 business days. We ask that you follow-up with your pharmacy.

## 2024-03-13 ENCOUNTER — Encounter: Admitting: Physician Assistant

## 2024-03-13 DIAGNOSIS — I509 Heart failure, unspecified: Secondary | ICD-10-CM

## 2024-03-13 NOTE — Telephone Encounter (Signed)
 Requested medication (s) are due for refill today: routing for review  Requested medication (s) are on the active medication list: no  Last refill:  03/06/24  Future visit scheduled: yes  Notes to clinic:  historical medication, not on current list. Patient took last dose 03/09/24     Requested Prescriptions  Pending Prescriptions Disp Refills   sodium bicarbonate  650 MG tablet      Sig: Take 1 tablet (650 mg total) by mouth daily.     Endocrinology:  Minerals 2 Failed - 03/13/2024 12:20 PM      Failed - Mg Level in normal range and within 180 days    Magnesium  Date Value Ref Range Status  12/20/2023 2.6 (H) 1.7 - 2.4 mg/dL Final    Comment:    Performed at [facility], [address] [place], [place] [address]         Failed - BMP within normal limits in the last 6 months    Glucose  Date Value Ref Range Status  03/05/2024 81 70 - 99 mg/dL Final  89/94/7976 93  Final   Glucose, Bld  Date Value Ref Range Status  12/20/2023 109 (H) 70 - 99 mg/dL Final    Comment:    Glucose reference range applies only to samples taken after fasting for at least 8 hours.   Glucose-Capillary  Date Value Ref Range Status  12/20/2023 104 (H) 70 - 99 mg/dL Final    Comment:    Glucose reference range applies only to samples taken after fasting for at least 8 hours.   Calcium   Date Value Ref Range Status  03/05/2024 9.1 8.6 - 10.2 mg/dL Final   Sodium  Date Value Ref Range Status  03/05/2024 141 134 - 144 mmol/L Final   Potassium  Date Value Ref Range Status  03/05/2024 5.2 3.5 - 5.2 mmol/L Final   Chloride  Date Value Ref Range Status  03/05/2024 105 96 - 106 mmol/L Final   BUN  Date Value Ref Range Status  03/05/2024 45 (H) 8 - 27 mg/dL Final   Creatinine, Ser  Date Value Ref Range Status  03/05/2024 2.38 (H) 0.76 - 1.27 mg/dL Final   CO2  Date Value Ref Range Status  03/05/2024 18 (L) 20 - 29 mmol/L Final   GFR calc Af Amer  Date Value Ref Range  Status  08/26/2017 72 >59 mL/min/1.73 Final   eGFR  Date Value Ref Range Status  03/05/2024 26 (L) >59 mL/min/1.73 Final   GFR, Estimated  Date Value Ref Range Status  12/20/2023 38 (L) >60 mL/min Final    Comment:    (NOTE) Calculated using the CKD-EPI Creatinine Equation (2021)          Passed - Valid encounter within last 12 months    Recent Outpatient Visits           6 days ago CKD stage 3b, GFR 30-44 ml/min (HCC)   [name], MD   1 month ago Hypothyroidism, unspecified type   [name], MD   2 months ago Other fatigue   [name], MD   3 months ago Dyspnea on exertion   [name], MD   5 months ago Bilateral hearing loss due to cerumen impaction   [name], MD  Future Appointments             In 1 month Agbor-Etang, Redell, MD [facility]

## 2024-03-14 MED ORDER — SODIUM BICARBONATE 650 MG PO TABS: 650.0000 mg | ORAL_TABLET | Freq: Every day | ORAL | 1 refills | Status: DC

## 2024-03-15 ENCOUNTER — Encounter: Payer: Self-pay | Admitting: Oncology

## 2024-03-15 ENCOUNTER — Inpatient Hospital Stay

## 2024-03-15 ENCOUNTER — Inpatient Hospital Stay: Attending: Oncology | Admitting: Oncology

## 2024-03-15 ENCOUNTER — Telehealth: Payer: Self-pay | Admitting: Cardiology

## 2024-03-15 VITALS — BP 119/81 | HR 77 | Temp 98.2°F | Resp 18 | Ht 67.0 in | Wt 143.0 lb

## 2024-03-15 DIAGNOSIS — D72829 Elevated white blood cell count, unspecified: Secondary | ICD-10-CM | POA: Diagnosis not present

## 2024-03-15 DIAGNOSIS — I739 Peripheral vascular disease, unspecified: Secondary | ICD-10-CM | POA: Diagnosis not present

## 2024-03-15 DIAGNOSIS — Z79890 Hormone replacement therapy (postmenopausal): Secondary | ICD-10-CM | POA: Insufficient documentation

## 2024-03-15 DIAGNOSIS — Z7964 Long term (current) use of myelosuppressive agent: Secondary | ICD-10-CM | POA: Insufficient documentation

## 2024-03-15 DIAGNOSIS — I4892 Unspecified atrial flutter: Secondary | ICD-10-CM | POA: Diagnosis not present

## 2024-03-15 DIAGNOSIS — Z86718 Personal history of other venous thrombosis and embolism: Secondary | ICD-10-CM | POA: Diagnosis not present

## 2024-03-15 DIAGNOSIS — Z8551 Personal history of malignant neoplasm of bladder: Secondary | ICD-10-CM | POA: Diagnosis not present

## 2024-03-15 DIAGNOSIS — Z809 Family history of malignant neoplasm, unspecified: Secondary | ICD-10-CM | POA: Insufficient documentation

## 2024-03-15 DIAGNOSIS — Z79899 Other long term (current) drug therapy: Secondary | ICD-10-CM | POA: Insufficient documentation

## 2024-03-15 DIAGNOSIS — Z7901 Long term (current) use of anticoagulants: Secondary | ICD-10-CM | POA: Insufficient documentation

## 2024-03-15 DIAGNOSIS — Z7902 Long term (current) use of antithrombotics/antiplatelets: Secondary | ICD-10-CM | POA: Insufficient documentation

## 2024-03-15 DIAGNOSIS — I10 Essential (primary) hypertension: Secondary | ICD-10-CM | POA: Insufficient documentation

## 2024-03-15 DIAGNOSIS — I5022 Chronic systolic (congestive) heart failure: Secondary | ICD-10-CM | POA: Diagnosis not present

## 2024-03-15 DIAGNOSIS — Z87891 Personal history of nicotine dependence: Secondary | ICD-10-CM | POA: Diagnosis not present

## 2024-03-15 DIAGNOSIS — N289 Disorder of kidney and ureter, unspecified: Secondary | ICD-10-CM | POA: Diagnosis not present

## 2024-03-15 DIAGNOSIS — Z79624 Long term (current) use of inhibitors of nucleotide synthesis: Secondary | ICD-10-CM | POA: Insufficient documentation

## 2024-03-15 DIAGNOSIS — D45 Polycythemia vera: Secondary | ICD-10-CM | POA: Diagnosis not present

## 2024-03-15 DIAGNOSIS — E039 Hypothyroidism, unspecified: Secondary | ICD-10-CM | POA: Diagnosis not present

## 2024-03-15 DIAGNOSIS — B961 Klebsiella pneumoniae [K. pneumoniae] as the cause of diseases classified elsewhere: Secondary | ICD-10-CM | POA: Diagnosis not present

## 2024-03-15 DIAGNOSIS — D75839 Thrombocytosis, unspecified: Secondary | ICD-10-CM | POA: Diagnosis not present

## 2024-03-15 DIAGNOSIS — N1832 Chronic kidney disease, stage 3b: Secondary | ICD-10-CM | POA: Diagnosis not present

## 2024-03-15 DIAGNOSIS — I1310 Hypertensive heart and chronic kidney disease without heart failure, with stage 1 through stage 4 chronic kidney disease, or unspecified chronic kidney disease: Secondary | ICD-10-CM | POA: Diagnosis not present

## 2024-03-15 DIAGNOSIS — B9689 Other specified bacterial agents as the cause of diseases classified elsewhere: Secondary | ICD-10-CM | POA: Diagnosis not present

## 2024-03-15 DIAGNOSIS — N390 Urinary tract infection, site not specified: Secondary | ICD-10-CM | POA: Diagnosis not present

## 2024-03-15 NOTE — Telephone Encounter (Signed)
Patient wants a call back to discuss instructions for upcoming procedure

## 2024-03-15 NOTE — Telephone Encounter (Signed)
 Called patient back to go over date and times of heart cath. [facility] to get blood work done for pre-procedure and to come on 12/12 at 0800 for pre-hydration for heart cath on same day. Patient wrote everything down, verbalized understanding. No further questions at this time.

## 2024-03-15 NOTE — Progress Notes (Unsigned)
 [facility]  [phone] [phone]  ID: Jerry Mcpherson OB: May 12, 1940  MR#: [id number]  RDW#:[id number]  Patient Care Team: [name], MD as PCP - General (Family Medicine) O'Branski, Rocky Lesches, PA (Inactive) (Oncology) System, Provider Not In Maree Raynaldo Erm, MD (Urology) Hoimes, Lonni PARAS, DO as Referring Physician (Hematology and Oncology) Rendall, Alm RAMAN, PA (Cardiology) Luke Salt, MD (Vascular Surgery) Jacobo Evalene PARAS, MD as Consulting Physician (Oncology)  CHIEF COMPLAINT: Polycythemia vera, history of urothelial carcinoma.  INTERVAL HISTORY: Patient is an 83 year old male who is transferring care to be closer to home.  He has a history of bladder cancer and received neoadjuvant chemotherapy along with complete cystectomy and has a urostomy tube in place.  He also takes 500 mg Hydrea  daily for polycythemia vera.  He does not require phlebotomy.  He currently feels well and is asymptomatic.  He has no neurologic complaints.  He denies any recent fevers or illnesses.  He has a good appetite and denies weight loss.  He has no chest pain, shortness of breath, cough, or hemoptysis.  He denies any nausea, vomiting, constipation, or diarrhea.  He has no urinary complaints.  Patient offers no specific complaints today.  REVIEW OF SYSTEMS:   Review of Systems  Constitutional: Negative.  Negative for fever, malaise/fatigue and weight loss.  Respiratory: Negative.  Negative for cough, hemoptysis and shortness of breath.   Cardiovascular: Negative.  Negative for chest pain and leg swelling.  Gastrointestinal:  Negative for abdominal pain.  Genitourinary: Negative.  Negative for dysuria.  Musculoskeletal: Negative.  Negative for back pain.  Skin: Negative.  Negative for rash.  Neurological: Negative.  Negative for dizziness, focal weakness, weakness and headaches.  Psychiatric/Behavioral: Negative.  The patient is not  nervous/anxious.     As per HPI. Otherwise, a complete review of systems is negative.  PAST MEDICAL HISTORY: Past Medical History:  Diagnosis Date   Acute deep vein thrombosis (DVT) of right femoral vein (HCC) 10/28/2020   US  10/24/2020 [facility] Acute right lower extremity DVT extending from the common femoral vein to  the popliteal vein.     Bladder cancer (HCC)    Genital herpes    Hyperglycemia    Hypertension    Squamous cell carcinoma     PAST SURGICAL HISTORY: Past Surgical History:  Procedure Laterality Date   CATARACT EXTRACTION Left 10/2012   Dr. Lethea; [facility]   CYSTECTOMY W/ URETEROILEAL CONDUIT  07/24/2020   [facility] for Stage IV bladder cancer extending to prostate   EYE SURGERY Left    removed a piece of steel   FEMORAL ARTERY STENT Left 06/16/2022   SFA stenting at [facility]   HERNIA REPAIR  12/08/2006   umbilical hernia, incarderated; Dr. Dessa   PROSTATECTOMY  4/14/2-22   [facility] for Stage IV bladder cancer extending to prostate   ROTATOR CUFF REPAIR     SQUAMOUS CELL CARCINOMA EXCISION  2008   TONSILLECTOMY      FAMILY HISTORY: Family History  Problem Relation Age of Onset   Hypertension Mother    Heart attack Mother    Stroke Father    Cancer Father    COPD Sister    Congestive Heart Failure Brother     ADVANCED DIRECTIVES (Y/N):  N  HEALTH MAINTENANCE: Social History   Tobacco Use   Smoking status: Former    Current packs/day: 0.00    Average packs/day: 3.0 packs/day for 15.0 years (45.0 ttl pk-yrs)  Types: Cigarettes    Start date: 04/12/1953    Quit date: 04/12/1968    Years since quitting: 55.9   Smokeless tobacco: Never  Vaping Use   Vaping status: Never Used  Substance Use Topics   Alcohol use: No    Alcohol/week: 0.0 standard drinks of alcohol   Drug use: No     Colonoscopy:  PAP:  Bone density:  Lipid panel:  Allergies  Allergen Reactions   Losartan Potassium Swelling    angioedema Other reaction(s): Angioedema    Shellfish Allergy Swelling and Rash    Rash, itching, swelling   Currently eats SHELLFISH without symptoms   Shellfish Protein-Containing Drug Products Dermatitis and Swelling    Rash, itching, swelling    Rash, itching, swelling   Currently eats SHELLFISH without symptoms   Doxycycline  Other (See Comments)    Blisters on hand   Ultram  [Tramadol Hcl] Nausea And Vomiting   Celecoxib Itching, Swelling, Rash and Other (See Comments)    Cannot take when eating shellfish   Codeine Palpitations    Heart racing   Cyclobenzaprine Other (See Comments)    Musculoskeletal therapy agents cause Excessive sedation Musculoskeletal therapy agents cause excessive sedation   Tramadol Hcl Nausea And Vomiting    Current Outpatient Medications  Medication Sig Dispense Refill   apixaban  (ELIQUIS ) 2.5 MG TABS tablet Take 1 tablet (2.5 mg total) by mouth 2 (two) times daily. 60 tablet 0   clopidogrel  (PLAVIX ) 75 MG tablet Take 75 mg by mouth daily.     ferrous sulfate  325 (65 FE) MG tablet Take 325 mg by mouth daily.     furosemide  (LASIX ) 20 MG tablet Take 2 tablets (40 mg total) by mouth daily. (Patient taking differently: Take 20 mg by mouth daily.)     hydroxyurea  (HYDREA ) 500 MG capsule Take 500 mg by mouth daily.     levothyroxine  (SYNTHROID ) 50 MCG tablet Take 1 tablet (50 mcg total) by mouth daily. PLEASE NOTE CHANGE TO AND CANCEL ANY REMAINING REFILLS OF THE 25s 90 tablet 0   metoprolol  succinate (TOPROL -XL) 50 MG 24 hr tablet Take 50 mg by mouth.     Multiple Vitamins-Minerals (MULTIVITAMIN ADULT PO) Take 1 tablet by mouth daily.     oxyCODONE -acetaminophen  (PERCOCET) 10-325 MG tablet Take 1 tablet by mouth every 6 (six) hours as needed. for pain 120 tablet 0   sodium bicarbonate  650 MG tablet Take 1 tablet (650 mg total) by mouth daily. 60 tablet 1   valACYclovir  (VALTREX ) 1000 MG tablet TAKE 1 TABLET BY MOUTH TWICE A DAY AS NEEDED 15 tablet 4   amiodarone  (PACERONE ) 200 MG tablet  Take 1 tablet (200 mg total) by mouth daily. 30 tablet 0   apixaban  (ELIQUIS ) 5 MG TABS tablet Take 5 mg by mouth daily. (Patient not taking: Reported on 03/15/2024)     No current facility-administered medications for this visit.    OBJECTIVE: Vitals:   03/15/24 1047  BP: 119/81  Pulse: 77  Resp: 18  Temp: 98.2 F (36.8 C)  SpO2: 98%     Body mass index is 22.4 kg/m.    ECOG FS:0 - Asymptomatic  General: Well-developed, well-nourished, no acute distress. Eyes: Pink conjunctiva, anicteric sclera. HEENT: Normocephalic, moist mucous membranes. Lungs: No audible wheezing or coughing. Heart: Regular rate and rhythm. Abdomen: Soft, nontender, no obvious distention. Musculoskeletal: No edema, cyanosis, or clubbing. Neuro: Alert, answering all questions appropriately. Cranial nerves grossly intact. Skin: No rashes or petechiae noted. Psych: Normal  affect. Lymphatics: No cervical, calvicular, axillary or inguinal LAD.   LAB RESULTS:  Lab Results  Component Value Date   NA 141 03/05/2024   K 5.2 03/05/2024   CL 105 03/05/2024   CO2 18 (L) 03/05/2024   GLUCOSE 81 03/05/2024   BUN 45 (H) 03/05/2024   CREATININE 2.38 (H) 03/05/2024   CALCIUM  9.1 03/05/2024   PROT 6.8 01/04/2024   ALBUMIN 4.3 01/04/2024   AST 13 01/04/2024   ALT 8 01/04/2024   ALKPHOS 119 01/04/2024   BILITOT 0.4 01/04/2024   GFRNONAA 38 (L) 12/20/2023   GFRAA 72 08/26/2017    Lab Results  Component Value Date   WBC 21.8 (HH) 03/05/2024   NEUTROABS 15.9 (H) 12/18/2023   HGB 14.6 03/05/2024   HCT 48.5 03/05/2024   MCV 85 03/05/2024   PLT 638 (H) 03/05/2024     STUDIES: No results found.  ASSESSMENT: JAK2 positive polycythemia vera  PLAN:    JAK2 positive polycythemia vera: Diagnosed in February 2020.  Currently on Hydrea  500 mg daily.  Patient's hemoglobin continues to be within normal limits at 14.6.  He previously required phlebotomy, but this has been discontinued.  No further intervention  is needed.  Patient does not require repeat bone marrow biopsy.  Return to clinic in 2 months with repeat laboratory work, further evaluation, and continuation of Hydrea .   Urothelial carcinoma: Patient noted to have a complete pathological response after receiving dose dense MVAC neoadjuvant chemotherapy in 2022.  He subsequent underwent total cystectomy and has a urothelial tube in place.  His most recent imaging on November 12, 2022 with MRI did not reveal any recurrent or progressive disease.  Can consider repeat imaging in the near future.  Patient also will likely require follow-up with local urologist. Leukocytosis: Possibly related to underlying P vera.  Will get peripheral blood flow cytometry with next lab draw. Thrombocytosis: Likely reactive, monitor. Renal insufficiency: Patient's creatinine has trended up to 2.38.  Patient may require referral to nephrology in the future.   Patient expressed understanding and was in agreement with this plan. He also understands that He can call clinic at any time with any questions, concerns, or complaints.    Cancer Staging  Malignant neoplasm of overlapping sites of bladder [facility]) Staging form: Urinary Bladder, AJCC 8th Edition - Pathologic stage from 03/17/2024: ypT0, ypN0, cM0 - Signed by Jacobo Evalene PARAS, MD on 03/17/2024 Stage prefix: Post-therapy   Evalene PARAS Jacobo, MD   03/17/2024 8:02 AM

## 2024-03-21 ENCOUNTER — Other Ambulatory Visit: Payer: Self-pay | Admitting: Emergency Medicine

## 2024-03-21 DIAGNOSIS — Z01810 Encounter for preprocedural cardiovascular examination: Secondary | ICD-10-CM

## 2024-03-21 NOTE — Progress Notes (Signed)
 CBC and BMP orders placed for Pre-Procedural Left and Heart Cath scheduled for 03/23/24 with Dr. Mady.

## 2024-03-22 ENCOUNTER — Ambulatory Visit: Payer: Self-pay | Admitting: Internal Medicine

## 2024-03-22 LAB — CBC
Hematocrit: 44.9 % (ref 37.5–51.0)
Hemoglobin: 13.4 g/dL (ref 13.0–17.7)
MCH: 25.5 pg — ABNORMAL LOW (ref 26.6–33.0)
MCHC: 29.8 g/dL — ABNORMAL LOW (ref 31.5–35.7)
MCV: 85 fL (ref 79–97)
Platelets: 592 x10E3/uL — ABNORMAL HIGH (ref 150–450)
RBC: 5.26 x10E6/uL (ref 4.14–5.80)
RDW: 20.7 % — ABNORMAL HIGH (ref 11.6–15.4)
WBC: 26.1 x10E3/uL (ref 3.4–10.8)

## 2024-03-22 LAB — BASIC METABOLIC PANEL WITH GFR
BUN/Creatinine Ratio: 21 (ref 10–24)
BUN: 52 mg/dL — ABNORMAL HIGH (ref 8–27)
CO2: 18 mmol/L — ABNORMAL LOW (ref 20–29)
Calcium: 9 mg/dL (ref 8.6–10.2)
Chloride: 106 mmol/L (ref 96–106)
Creatinine, Ser: 2.43 mg/dL — ABNORMAL HIGH (ref 0.76–1.27)
Glucose: 102 mg/dL — ABNORMAL HIGH (ref 70–99)
Potassium: 4.9 mmol/L (ref 3.5–5.2)
Sodium: 141 mmol/L (ref 134–144)
eGFR: 25 mL/min/1.73 — ABNORMAL LOW (ref >59)

## 2024-03-23 ENCOUNTER — Encounter: Admission: RE | Disposition: A | Payer: Self-pay | Source: Home / Self Care | Attending: Internal Medicine

## 2024-03-23 ENCOUNTER — Observation Stay

## 2024-03-23 ENCOUNTER — Other Ambulatory Visit: Payer: Self-pay

## 2024-03-23 ENCOUNTER — Encounter: Payer: Self-pay | Admitting: Internal Medicine

## 2024-03-23 ENCOUNTER — Inpatient Hospital Stay
Admission: RE | Admit: 2024-03-23 | Discharge: 2024-03-31 | DRG: 286 | Disposition: A | Discharge status: Home Health | Attending: Internal Medicine | Admitting: Internal Medicine

## 2024-03-23 ENCOUNTER — Ambulatory Visit: Admitting: Family Medicine

## 2024-03-23 DIAGNOSIS — I4892 Unspecified atrial flutter: Secondary | ICD-10-CM | POA: Diagnosis present

## 2024-03-23 DIAGNOSIS — Z825 Family history of asthma and other chronic lower respiratory diseases: Secondary | ICD-10-CM

## 2024-03-23 DIAGNOSIS — N1832 Chronic kidney disease, stage 3b: Secondary | ICD-10-CM | POA: Diagnosis not present

## 2024-03-23 DIAGNOSIS — Z79890 Hormone replacement therapy (postmenopausal): Secondary | ICD-10-CM

## 2024-03-23 DIAGNOSIS — E46 Unspecified protein-calorie malnutrition: Secondary | ICD-10-CM | POA: Diagnosis not present

## 2024-03-23 DIAGNOSIS — I088 Other rheumatic multiple valve diseases: Secondary | ICD-10-CM | POA: Diagnosis not present

## 2024-03-23 DIAGNOSIS — I70245 Atherosclerosis of native arteries of left leg with ulceration of other part of foot: Secondary | ICD-10-CM | POA: Diagnosis present

## 2024-03-23 DIAGNOSIS — Z604 Social exclusion and rejection: Secondary | ICD-10-CM | POA: Diagnosis present

## 2024-03-23 DIAGNOSIS — I739 Peripheral vascular disease, unspecified: Secondary | ICD-10-CM | POA: Diagnosis not present

## 2024-03-23 DIAGNOSIS — Z8551 Personal history of malignant neoplasm of bladder: Secondary | ICD-10-CM

## 2024-03-23 DIAGNOSIS — Z936 Other artificial openings of urinary tract status: Secondary | ICD-10-CM

## 2024-03-23 DIAGNOSIS — L03116 Cellulitis of left lower limb: Secondary | ICD-10-CM | POA: Diagnosis not present

## 2024-03-23 DIAGNOSIS — M25562 Pain in left knee: Secondary | ICD-10-CM | POA: Diagnosis present

## 2024-03-23 DIAGNOSIS — Z8673 Personal history of transient ischemic attack (TIA), and cerebral infarction without residual deficits: Secondary | ICD-10-CM

## 2024-03-23 DIAGNOSIS — E039 Hypothyroidism, unspecified: Secondary | ICD-10-CM | POA: Diagnosis present

## 2024-03-23 DIAGNOSIS — I080 Rheumatic disorders of both mitral and aortic valves: Secondary | ICD-10-CM | POA: Diagnosis present

## 2024-03-23 DIAGNOSIS — Z87891 Personal history of nicotine dependence: Secondary | ICD-10-CM

## 2024-03-23 DIAGNOSIS — N179 Acute kidney failure, unspecified: Secondary | ICD-10-CM | POA: Diagnosis present

## 2024-03-23 DIAGNOSIS — I480 Paroxysmal atrial fibrillation: Secondary | ICD-10-CM | POA: Diagnosis not present

## 2024-03-23 DIAGNOSIS — I5022 Chronic systolic (congestive) heart failure: Secondary | ICD-10-CM | POA: Diagnosis not present

## 2024-03-23 DIAGNOSIS — N2581 Secondary hyperparathyroidism of renal origin: Secondary | ICD-10-CM | POA: Diagnosis present

## 2024-03-23 DIAGNOSIS — I1310 Hypertensive heart and chronic kidney disease without heart failure, with stage 1 through stage 4 chronic kidney disease, or unspecified chronic kidney disease: Secondary | ICD-10-CM | POA: Diagnosis not present

## 2024-03-23 DIAGNOSIS — R579 Shock, unspecified: Secondary | ICD-10-CM

## 2024-03-23 DIAGNOSIS — I2510 Atherosclerotic heart disease of native coronary artery without angina pectoris: Secondary | ICD-10-CM

## 2024-03-23 DIAGNOSIS — Z5941 Food insecurity: Secondary | ICD-10-CM

## 2024-03-23 DIAGNOSIS — Z85820 Personal history of malignant melanoma of skin: Secondary | ICD-10-CM

## 2024-03-23 DIAGNOSIS — Z79899 Other long term (current) drug therapy: Secondary | ICD-10-CM

## 2024-03-23 DIAGNOSIS — R55 Syncope and collapse: Secondary | ICD-10-CM | POA: Diagnosis not present

## 2024-03-23 DIAGNOSIS — I700 Atherosclerosis of aorta: Secondary | ICD-10-CM | POA: Diagnosis not present

## 2024-03-23 DIAGNOSIS — Z881 Allergy status to other antibiotic agents status: Secondary | ICD-10-CM

## 2024-03-23 DIAGNOSIS — I483 Typical atrial flutter: Secondary | ICD-10-CM | POA: Diagnosis present

## 2024-03-23 DIAGNOSIS — N184 Chronic kidney disease, stage 4 (severe): Secondary | ICD-10-CM | POA: Diagnosis present

## 2024-03-23 DIAGNOSIS — I509 Heart failure, unspecified: Secondary | ICD-10-CM

## 2024-03-23 DIAGNOSIS — I25118 Atherosclerotic heart disease of native coronary artery with other forms of angina pectoris: Secondary | ICD-10-CM

## 2024-03-23 DIAGNOSIS — Z906 Acquired absence of other parts of urinary tract: Secondary | ICD-10-CM

## 2024-03-23 DIAGNOSIS — I130 Hypertensive heart and chronic kidney disease with heart failure and stage 1 through stage 4 chronic kidney disease, or unspecified chronic kidney disease: Secondary | ICD-10-CM | POA: Diagnosis present

## 2024-03-23 DIAGNOSIS — Z5982 Transportation insecurity: Secondary | ICD-10-CM

## 2024-03-23 DIAGNOSIS — R000 Tachycardia, unspecified: Secondary | ICD-10-CM | POA: Diagnosis present

## 2024-03-23 DIAGNOSIS — I255 Ischemic cardiomyopathy: Secondary | ICD-10-CM | POA: Diagnosis present

## 2024-03-23 DIAGNOSIS — I5042 Chronic combined systolic (congestive) and diastolic (congestive) heart failure: Secondary | ICD-10-CM | POA: Diagnosis present

## 2024-03-23 DIAGNOSIS — I872 Venous insufficiency (chronic) (peripheral): Secondary | ICD-10-CM | POA: Diagnosis not present

## 2024-03-23 DIAGNOSIS — R578 Other shock: Secondary | ICD-10-CM | POA: Diagnosis not present

## 2024-03-23 DIAGNOSIS — I70222 Atherosclerosis of native arteries of extremities with rest pain, left leg: Secondary | ICD-10-CM | POA: Diagnosis present

## 2024-03-23 DIAGNOSIS — Z59868 Other specified financial insecurity: Secondary | ICD-10-CM

## 2024-03-23 DIAGNOSIS — Z8249 Family history of ischemic heart disease and other diseases of the circulatory system: Secondary | ICD-10-CM

## 2024-03-23 DIAGNOSIS — I2582 Chronic total occlusion of coronary artery: Secondary | ICD-10-CM | POA: Diagnosis present

## 2024-03-23 DIAGNOSIS — Z515 Encounter for palliative care: Secondary | ICD-10-CM

## 2024-03-23 DIAGNOSIS — I4891 Unspecified atrial fibrillation: Secondary | ICD-10-CM

## 2024-03-23 DIAGNOSIS — I998 Other disorder of circulatory system: Secondary | ICD-10-CM

## 2024-03-23 DIAGNOSIS — D72829 Elevated white blood cell count, unspecified: Secondary | ICD-10-CM | POA: Diagnosis present

## 2024-03-23 DIAGNOSIS — R64 Cachexia: Secondary | ICD-10-CM | POA: Diagnosis present

## 2024-03-23 DIAGNOSIS — L89312 Pressure ulcer of right buttock, stage 2: Secondary | ICD-10-CM | POA: Diagnosis present

## 2024-03-23 DIAGNOSIS — T783XXA Angioneurotic edema, initial encounter: Secondary | ICD-10-CM | POA: Diagnosis present

## 2024-03-23 DIAGNOSIS — E785 Hyperlipidemia, unspecified: Secondary | ICD-10-CM | POA: Diagnosis present

## 2024-03-23 DIAGNOSIS — L8990 Pressure ulcer of unspecified site, unspecified stage: Secondary | ICD-10-CM | POA: Insufficient documentation

## 2024-03-23 DIAGNOSIS — Z885 Allergy status to narcotic agent status: Secondary | ICD-10-CM

## 2024-03-23 DIAGNOSIS — Z6822 Body mass index (BMI) 22.0-22.9, adult: Secondary | ICD-10-CM

## 2024-03-23 DIAGNOSIS — Z7902 Long term (current) use of antithrombotics/antiplatelets: Secondary | ICD-10-CM

## 2024-03-23 DIAGNOSIS — Z7901 Long term (current) use of anticoagulants: Secondary | ICD-10-CM

## 2024-03-23 DIAGNOSIS — I5020 Unspecified systolic (congestive) heart failure: Secondary | ICD-10-CM | POA: Insufficient documentation

## 2024-03-23 DIAGNOSIS — Z888 Allergy status to other drugs, medicaments and biological substances status: Secondary | ICD-10-CM

## 2024-03-23 DIAGNOSIS — E874 Mixed disorder of acid-base balance: Secondary | ICD-10-CM | POA: Diagnosis present

## 2024-03-23 DIAGNOSIS — D45 Polycythemia vera: Secondary | ICD-10-CM | POA: Diagnosis present

## 2024-03-23 DIAGNOSIS — L97529 Non-pressure chronic ulcer of other part of left foot with unspecified severity: Secondary | ICD-10-CM | POA: Diagnosis present

## 2024-03-23 DIAGNOSIS — Z823 Family history of stroke: Secondary | ICD-10-CM

## 2024-03-23 DIAGNOSIS — I132 Hypertensive heart and chronic kidney disease with heart failure and with stage 5 chronic kidney disease, or end stage renal disease: Secondary | ICD-10-CM

## 2024-03-23 DIAGNOSIS — Z86718 Personal history of other venous thrombosis and embolism: Secondary | ICD-10-CM

## 2024-03-23 DIAGNOSIS — C678 Malignant neoplasm of overlapping sites of bladder: Secondary | ICD-10-CM | POA: Diagnosis present

## 2024-03-23 DIAGNOSIS — Z91013 Allergy to seafood: Secondary | ICD-10-CM

## 2024-03-23 DIAGNOSIS — F32A Depression, unspecified: Secondary | ICD-10-CM | POA: Diagnosis present

## 2024-03-23 LAB — APTT: aPTT: 45 s — ABNORMAL HIGH (ref 24–36)

## 2024-03-23 LAB — CBC WITH DIFFERENTIAL/PLATELET
Abs Immature Granulocytes: 0.21 K/uL — ABNORMAL HIGH (ref 0.00–0.07)
Basophils Absolute: 0.3 K/uL — ABNORMAL HIGH (ref 0.0–0.1)
Basophils Relative: 1 %
Eosinophils Absolute: 1 K/uL — ABNORMAL HIGH (ref 0.0–0.5)
Eosinophils Relative: 4 %
HCT: 52.3 % — ABNORMAL HIGH (ref 39.0–52.0)
Hemoglobin: 16 g/dL (ref 13.0–17.0)
Immature Granulocytes: 1 %
Lymphocytes Relative: 7 %
Lymphs Abs: 1.6 K/uL (ref 0.7–4.0)
MCH: 25.6 pg — ABNORMAL LOW (ref 26.0–34.0)
MCHC: 30.6 g/dL (ref 30.0–36.0)
MCV: 83.7 fL (ref 80.0–100.0)
Monocytes Absolute: 1 K/uL (ref 0.1–1.0)
Monocytes Relative: 5 %
Neutro Abs: 19.1 K/uL — ABNORMAL HIGH (ref 1.7–7.7)
Neutrophils Relative %: 82 %
Platelets: 579 K/uL — ABNORMAL HIGH (ref 150–400)
RBC: 6.25 MIL/uL — ABNORMAL HIGH (ref 4.22–5.81)
RDW: 24 % — ABNORMAL HIGH (ref 11.5–15.5)
WBC: 23.3 K/uL — ABNORMAL HIGH (ref 4.0–10.5)
nRBC: 0 % (ref 0.0–0.2)

## 2024-03-23 LAB — POCT I-STAT EG7
Acid-base deficit: 6 mmol/L — ABNORMAL HIGH (ref 0.0–2.0)
Bicarbonate: 15.4 mmol/L — ABNORMAL LOW (ref 20.0–28.0)
Calcium, Ion: 1.17 mmol/L (ref 1.15–1.40)
HCT: 47 % (ref 39.0–52.0)
Hemoglobin: 16 g/dL (ref 13.0–17.0)
O2 Saturation: 63 %
Potassium: 4.4 mmol/L (ref 3.5–5.1)
Sodium: 143 mmol/L (ref 135–145)
TCO2: 16 mmol/L — ABNORMAL LOW (ref 22–32)
pCO2, Ven: 22.4 mmHg — ABNORMAL LOW (ref 44–60)
pH, Ven: 7.447 — ABNORMAL HIGH (ref 7.25–7.43)
pO2, Ven: 30 mmHg — CL (ref 32–45)

## 2024-03-23 LAB — PROTIME-INR
INR: 1.2 (ref 0.8–1.2)
Prothrombin Time: 15.6 s — ABNORMAL HIGH (ref 11.4–15.2)

## 2024-03-23 LAB — POCT I-STAT 7, (LYTES, BLD GAS, ICA,H+H)
Acid-base deficit: 7 mmol/L — ABNORMAL HIGH (ref 0.0–2.0)
Bicarbonate: 14 mmol/L — ABNORMAL LOW (ref 20.0–28.0)
Calcium, Ion: 1.15 mmol/L (ref 1.15–1.40)
HCT: 47 % (ref 39.0–52.0)
Hemoglobin: 16 g/dL (ref 13.0–17.0)
O2 Saturation: 98 %
Potassium: 4.5 mmol/L (ref 3.5–5.1)
Sodium: 142 mmol/L (ref 135–145)
TCO2: 15 mmol/L — ABNORMAL LOW (ref 22–32)
pCO2 arterial: 19.2 mmHg — CL (ref 32–48)
pH, Arterial: 7.472 — ABNORMAL HIGH (ref 7.35–7.45)
pO2, Arterial: 101 mmHg (ref 83–108)

## 2024-03-23 LAB — URINALYSIS, COMPLETE (UACMP) WITH MICROSCOPIC
Bilirubin Urine: NEGATIVE
Glucose, UA: NEGATIVE mg/dL
Ketones, ur: NEGATIVE mg/dL
Nitrite: POSITIVE — AB
Protein, ur: NEGATIVE mg/dL
RBC / HPF: >50 RBC/hpf (ref 0–5)
Specific Gravity, Urine: 1.01 (ref 1.005–1.030)
Squamous Epithelial / HPF: 0 /HPF (ref 0–5)
pH: 7 (ref 5.0–8.0)

## 2024-03-23 LAB — LACTIC ACID, PLASMA: Lactic Acid, Venous: 1.6 mmol/L (ref 0.5–1.9)

## 2024-03-23 LAB — LACTATE DEHYDROGENASE: LDH: 248 U/L — ABNORMAL HIGH (ref 105–235)

## 2024-03-23 LAB — POCT ACTIVATED CLOTTING TIME: Activated Clotting Time: 179 s

## 2024-03-23 MED ORDER — HEPARIN (PORCINE) IN NACL 1000-0.9 UT/500ML-% IV SOLN
INTRAVENOUS | Status: DC | PRN
  Administered 2024-03-23: 1000 mL

## 2024-03-23 MED ORDER — FENTANYL CITRATE (PF) 100 MCG/2ML IJ SOLN
INTRAMUSCULAR | Status: DC | PRN
  Administered 2024-03-23 (×2): 12.5 ug via INTRAVENOUS

## 2024-03-23 MED ORDER — HYDROXYUREA 500 MG PO CAPS
500.0000 mg | ORAL_CAPSULE | Freq: Every day | ORAL | Status: DC
  Administered 2024-03-24 – 2024-03-31 (×8): 500 mg via ORAL
  Filled 2024-03-23 (×8): qty 1

## 2024-03-23 MED ORDER — ACETAMINOPHEN 325 MG PO TABS
650.0000 mg | ORAL_TABLET | ORAL | Status: DC | PRN
  Administered 2024-03-28 – 2024-03-31 (×5): 650 mg via ORAL
  Filled 2024-03-23 (×5): qty 2

## 2024-03-23 MED ORDER — LIDOCAINE HCL 1 % IJ SOLN
INTRAMUSCULAR | Status: AC
  Filled 2024-03-23: qty 20

## 2024-03-23 MED ORDER — ASPIRIN 81 MG PO CHEW
CHEWABLE_TABLET | ORAL | Status: AC
  Filled 2024-03-23: qty 1

## 2024-03-23 MED ORDER — SODIUM CHLORIDE 0.9 % IV SOLN: INTRAVENOUS | Status: DC

## 2024-03-23 MED ORDER — SODIUM CHLORIDE 0.9% FLUSH: 3.0000 mL | INTRAVENOUS | Status: DC | PRN

## 2024-03-23 MED ORDER — ASPIRIN 81 MG PO CHEW
81.0000 mg | CHEWABLE_TABLET | Freq: Once | ORAL | Status: AC
  Administered 2024-03-23: 81 mg via ORAL

## 2024-03-23 MED ORDER — FERROUS SULFATE 325 (65 FE) MG PO TABS
325.0000 mg | ORAL_TABLET | Freq: Every day | ORAL | Status: DC
  Administered 2024-03-23 – 2024-03-31 (×9): 325 mg via ORAL
  Filled 2024-03-23 (×9): qty 1

## 2024-03-23 MED ORDER — LIDOCAINE HCL (PF) 1 % IJ SOLN
INTRAMUSCULAR | Status: DC | PRN
  Administered 2024-03-23: 10 mL
  Administered 2024-03-23: 2 mL
  Administered 2024-03-23: 5 mL

## 2024-03-23 MED ORDER — SODIUM CHLORIDE 0.9% FLUSH
3.0000 mL | Freq: Two times a day (BID) | INTRAVENOUS | Status: DC
  Administered 2024-03-24 – 2024-03-31 (×14): 3 mL via INTRAVENOUS

## 2024-03-23 MED ORDER — MIDAZOLAM HCL 2 MG/2ML IJ SOLN
INTRAMUSCULAR | Status: AC
  Filled 2024-03-23: qty 2

## 2024-03-23 MED ORDER — SODIUM CHLORIDE 0.9 % IV SOLN: 250.0000 mL | INTRAVENOUS | Status: DC | PRN

## 2024-03-23 MED ORDER — VERAPAMIL HCL 2.5 MG/ML IV SOLN
INTRAVENOUS | Status: DC | PRN
  Administered 2024-03-23: 2.5 mg via INTRA_ARTERIAL

## 2024-03-23 MED ORDER — OXYCODONE-ACETAMINOPHEN 10-325 MG PO TABS: 1.0000 | ORAL_TABLET | Freq: Four times a day (QID) | ORAL | Status: DC | PRN

## 2024-03-23 MED ORDER — OXYCODONE HCL 5 MG PO TABS
10.0000 mg | ORAL_TABLET | Freq: Four times a day (QID) | ORAL | Status: DC | PRN
  Administered 2024-03-23 – 2024-03-31 (×5): 10 mg via ORAL
  Filled 2024-03-23 (×4): qty 2

## 2024-03-23 MED ORDER — CLOPIDOGREL BISULFATE 75 MG PO TABS
75.0000 mg | ORAL_TABLET | Freq: Every day | ORAL | Status: DC
  Administered 2024-03-23 – 2024-03-31 (×9): 75 mg via ORAL
  Filled 2024-03-23 (×9): qty 1

## 2024-03-23 MED ORDER — LEVOTHYROXINE SODIUM 50 MCG PO TABS
50.0000 ug | ORAL_TABLET | Freq: Every day | ORAL | Status: DC
  Administered 2024-03-24 – 2024-03-31 (×7): 50 ug via ORAL
  Filled 2024-03-23 (×7): qty 1

## 2024-03-23 MED ORDER — SODIUM BICARBONATE 650 MG PO TABS: 650.0000 mg | ORAL_TABLET | Freq: Every day | ORAL | Status: DC

## 2024-03-23 MED ORDER — ACETAMINOPHEN 325 MG PO TABS
325.0000 mg | ORAL_TABLET | Freq: Four times a day (QID) | ORAL | Status: DC | PRN
  Administered 2024-03-23 – 2024-03-26 (×3): 325 mg via ORAL
  Filled 2024-03-23 (×2): qty 1

## 2024-03-23 MED ORDER — HYDRALAZINE HCL 20 MG/ML IJ SOLN: 10.0000 mg | INTRAMUSCULAR | Status: AC | PRN

## 2024-03-23 MED ORDER — SODIUM BICARBONATE 650 MG PO TABS
1300.0000 mg | ORAL_TABLET | Freq: Two times a day (BID) | ORAL | Status: DC
  Administered 2024-03-23 – 2024-03-31 (×16): 1300 mg via ORAL
  Filled 2024-03-23 (×16): qty 2

## 2024-03-23 MED ORDER — SODIUM CHLORIDE 0.9 % IV SOLN: INTRAVENOUS | Status: AC

## 2024-03-23 MED ORDER — HEPARIN SODIUM (PORCINE) 1000 UNIT/ML IJ SOLN
INTRAMUSCULAR | Status: AC
  Filled 2024-03-23: qty 10

## 2024-03-23 MED ORDER — ACETAMINOPHEN 325 MG PO TABS
ORAL_TABLET | ORAL | Status: AC
  Filled 2024-03-23: qty 1

## 2024-03-23 MED ORDER — METOPROLOL SUCCINATE 12.5 MG HALF TABLET
12.5000 mg | ORAL_TABLET | Freq: Every day | ORAL | Status: DC
  Filled 2024-03-23: qty 1

## 2024-03-23 MED ORDER — ONDANSETRON HCL 4 MG/2ML IJ SOLN
4.0000 mg | Freq: Four times a day (QID) | INTRAMUSCULAR | Status: DC | PRN
  Administered 2024-03-23 – 2024-03-26 (×2): 4 mg via INTRAVENOUS
  Filled 2024-03-23 (×2): qty 2

## 2024-03-23 MED ORDER — AMIODARONE HCL 200 MG PO TABS
200.0000 mg | ORAL_TABLET | Freq: Every day | ORAL | Status: DC
  Administered 2024-03-23: 200 mg via ORAL
  Filled 2024-03-23: qty 1

## 2024-03-23 MED ORDER — OXYCODONE HCL 5 MG PO TABS
ORAL_TABLET | ORAL | Status: AC
  Filled 2024-03-23: qty 2

## 2024-03-23 MED ORDER — IOHEXOL 300 MG/ML  SOLN
INTRAMUSCULAR | Status: DC | PRN
  Administered 2024-03-23: 28 mL

## 2024-03-23 MED ORDER — MIDAZOLAM HCL (PF) 2 MG/2ML IJ SOLN
INTRAMUSCULAR | Status: DC | PRN
  Administered 2024-03-23 (×2): .5 mg via INTRAVENOUS

## 2024-03-23 MED ORDER — HEPARIN (PORCINE) IN NACL 1000-0.9 UT/500ML-% IV SOLN
INTRAVENOUS | Status: AC
  Filled 2024-03-23: qty 1000

## 2024-03-23 MED ORDER — HEPARIN (PORCINE) 25000 UT/250ML-% IV SOLN
2050.0000 [IU]/h | INTRAVENOUS | Status: DC
  Administered 2024-03-24: 800 [IU]/h via INTRAVENOUS
  Administered 2024-03-24: 1150 [IU]/h via INTRAVENOUS
  Administered 2024-03-25: 1400 [IU]/h via INTRAVENOUS
  Administered 2024-03-26 (×2): 1750 [IU]/h via INTRAVENOUS
  Administered 2024-03-27: 17:00:00 1950 [IU]/h via INTRAVENOUS
  Administered 2024-03-27: 02:00:00 1850 [IU]/h via INTRAVENOUS
  Administered 2024-03-28: 06:00:00 2050 [IU]/h via INTRAVENOUS
  Filled 2024-03-23 (×7): qty 250

## 2024-03-23 MED ORDER — VERAPAMIL HCL 2.5 MG/ML IV SOLN
INTRAVENOUS | Status: AC
  Filled 2024-03-23: qty 2

## 2024-03-23 MED ORDER — HEPARIN SODIUM (PORCINE) 1000 UNIT/ML IJ SOLN
INTRAMUSCULAR | Status: DC | PRN
  Administered 2024-03-23: 3000 [IU] via INTRAVENOUS

## 2024-03-23 MED ORDER — FENTANYL CITRATE (PF) 100 MCG/2ML IJ SOLN
INTRAMUSCULAR | Status: AC
  Filled 2024-03-23: qty 2

## 2024-03-23 NOTE — Brief Op Note (Signed)
 BRIEF CARDIAC CATHETERIZATION NOTE  03/23/2024  3:02 PM  PATIENT:  Jerry Mcpherson  83 y.o. male  PRE-OPERATIVE DIAGNOSIS:  HFrEF and syncope  POST-OPERATIVE DIAGNOSIS:  Some  PROCEDURE:  Procedures: RIGHT/LEFT HEART CATH AND CORONARY ANGIOGRAPHY (Bilateral)  SURGEON:  Surgeons and Role:    * Deanda Ruddell, MD - Primary  FINDINGS: Severe left main coronary artery disease with 70% proximal stenosis.  Otherwise, mild-moderate CAD. Normal left and right heart filling pressures. Moderately reduced Fick cardiac output/index (CO 3.1 L/min, CI 1.8 L/min/m).  RECOMMENDATIONS: Admit for optimization of heart failure and further evaluation of suspected critical limb ischemia involving the left foot and worsening leukocytosis. May need to consider transfer to [facility] for consideration of LMCA stenting, though given the patient's comorbidities, palliative medical therapy will also need to be considered.  I do not think he is a candidate for CABG. Gentle postcatheterization hydration in the setting of low normal filling pressures and CKD stage IV.  Lonni Hanson, MD [facility]

## 2024-03-23 NOTE — Consult Note (Signed)
 Initial Consultation Note   Patient: Jerry Mcpherson FMW:[id number] DOB: 05-Jan-1941 PCP: Jerry Nancyann BRAVO, MD DOA: 03/23/2024 DOS: the patient was seen and examined on 03/23/2024 Primary service: Mady Bruckner, MD  Referring physician: Dr. Mady Reason for consult: Leukocytosis, PVD, CKD stage IV, CHF  Assessment/Plan:  Left 3rd and 4th toe chronic ulcer History of PVD - The ulcer appears to be chronic, likely secondary to PVD as pedal pulses on the left significantly diminished compared to the right side, has been followed by wound care center with weekly dressing change.  Most recent dressing change was done 2 days ago, and he was told the wound appears to be stable and no signs of infection. - Conservative management, consult wound care. - Hold off antibiotics. - Patient was evaluated by vascular surgeon, vascular surgery consultation appreciated, plan is for vascular surgeon to perform angiogram of the left lower extremity for possible potential revascularization.  Chronic leukocytosis - As per oncology's note from earlier this month, chronic leukocytosis likely secondary to polycythemia vera, for which patient has been taking hydroxyurea . - Patient denied any cough, no urinary symptoms or diarrhea and left fourth toe ulcer appears to be chronic, less likely has a active infection at this point.  Plan to monitor off antibiotics.  Decompensated combined non-anion gap metabolic acidosis and respiratory alkalosis  CKD stage IV Bladder cancer status post resection with chronic urostomy - Euvolemic, creatinine level stable - Increasing sodium HCO3 to 1300 mg twice daily - I have ordered a renal ultrasound - Case was discussed with on-call nephrology Dr. Dennise,  who agreed to be on the consult team too.  Syncope - Likely secondary to worsening of chronic HFrEF as well as moderate to severe aortic stenosis - Discussed with cardiology, who recommended conservative management. - He  appears to be euvolemic at this point  PAF - In sinus rhythm - Continue amiodarone , continue metoprolol  - Eliquis  on hold as per cardiology.  Polycythemia vera - Hemoglobin stable, continue hydroxyurea  - Patient follow-up with hematology.  Hypothyroidism - Continue Synthroid    TRH will continue to follow the patient.  HPI: Jerry Mcpherson is a 83 y.o. male with past medical history of recurrent auricles, chronic HFrEF with LVEF 30-35%, HTN, hypothyroidism, DVT, PAF on Eliquis , bladder cancer status post cystectomy and chronic urostomy, PVD, who has come in today for elective LHC.  Patient reported that he has a ulcer between the left 4th and 5th toes which developed about 2 months ago, for which has been following with wound care center with weekly  dressing change.  Most recent visit was 2 days ago, when he was told the wound appears to be stable and no signs of infection.  Currently patient denies any fever chills, no abdominal pain no nausea vomiting, denied any shortness of breath.  Review of Systems: As mentioned in the history of present illness. All other systems reviewed and are negative. Past Medical History:  Diagnosis Date   Acute deep vein thrombosis (DVT) of right femoral vein (HCC) 10/28/2020   US  10/24/2020 [facility] Acute right lower extremity DVT extending from the common femoral vein to  the popliteal vein.     Bladder cancer (HCC)    Genital herpes    Hyperglycemia    Hypertension    Squamous cell carcinoma    Past Surgical History:  Procedure Laterality Date   CATARACT EXTRACTION Left 10/2012   Dr. Lethea; [facility]   CYSTECTOMY W/ URETEROILEAL CONDUIT  07/24/2020  [facility] for Stage IV bladder cancer extending to prostate   EYE SURGERY Left    removed a piece of steel   FEMORAL ARTERY STENT Left 06/16/2022   SFA stenting at [facility]   HERNIA REPAIR  12/08/2006   umbilical hernia, incarderated; Dr. Dessa   PROSTATECTOMY  4/14/2-22   [facility] for  Stage IV bladder cancer extending to prostate   ROTATOR CUFF REPAIR     SQUAMOUS CELL CARCINOMA EXCISION  2008   TONSILLECTOMY     Social History:  reports that he quit smoking about 55 years ago. His smoking use included cigarettes. He started smoking about 70 years ago. He has a 45 pack-year smoking history. He has never used smokeless tobacco. He reports that he does not drink alcohol and does not use drugs.  Allergies[1]  Family History  Problem Relation Age of Onset   Hypertension Mother    Heart attack Mother    Stroke Father    Cancer Father    COPD Sister    Congestive Heart Failure Brother     Prior to Admission medications  Medication Sig Start Date End Date Taking? Authorizing Provider  clopidogrel  (PLAVIX ) 75 MG tablet Take 75 mg by mouth daily.   Yes [provider]  furosemide  (LASIX ) 20 MG tablet Take 2 tablets (40 mg total) by mouth daily. Patient taking differently: Take 20 mg by mouth daily. 03/05/24  Yes Darliss Rogue, MD  hydroxyurea  (HYDREA ) 500 MG capsule Take 500 mg by mouth daily.   Yes [provider]  levothyroxine  (SYNTHROID ) 50 MCG tablet Take 1 tablet (50 mcg total) by mouth daily. PLEASE NOTE CHANGE TO AND CANCEL ANY REMAINING REFILLS OF THE 25s 02/02/24  Yes Fisher, Nancyann BRAVO, MD  Multiple Vitamins-Minerals (MULTIVITAMIN ADULT PO) Take 1 tablet by mouth daily.   Yes [provider]  oxyCODONE -acetaminophen  (PERCOCET) 10-325 MG tablet Take 1 tablet by mouth every 6 (six) hours as needed. for pain 03/01/24  Yes Jerry Nancyann BRAVO, MD  amiodarone  (PACERONE ) 200 MG tablet Take 1 tablet (200 mg total) by mouth daily. 03/12/24   Jerry Nancyann BRAVO, MD  apixaban  (ELIQUIS ) 2.5 MG TABS tablet Take 1 tablet (2.5 mg total) by mouth 2 (two) times daily. 12/20/23   Alexander, Natalie, DO  apixaban  (ELIQUIS ) 5 MG TABS tablet Take 5 mg by mouth daily.    [provider]  ferrous sulfate  325 (65 FE) MG tablet Take 325 mg by mouth  daily.    [provider]  metoprolol  succinate (TOPROL -XL) 50 MG 24 hr tablet Take 50 mg by mouth. 01/01/24   [provider]  sodium bicarbonate  650 MG tablet Take 1 tablet (650 mg total) by mouth daily. 03/14/24   Jerry Nancyann BRAVO, MD  valACYclovir  (VALTREX ) 1000 MG tablet TAKE 1 TABLET BY MOUTH TWICE A DAY AS NEEDED 10/02/23   Jerry Nancyann BRAVO, MD    Physical Exam: Vitals:   03/23/24 1500 03/23/24 1515 03/23/24 1530 03/23/24 1545  BP: 130/81 128/80 (!) 158/81 (!) 142/77  Pulse: 73 71 72 71  Resp: 14 18 20 15   Temp:      TempSrc:      SpO2: 99% 96% 98% 100%  Weight:      Height:       Eyes: PERRL, lids and conjunctivae normal ENMT: Mucous membranes are moist. Posterior pharynx clear of any exudate or lesions.Normal dentition.  Neck: normal, supple, no masses, no thyromegaly Respiratory: clear to auscultation bilaterally, no wheezing, no crackles.  Normal respiratory effort. No accessory muscle use.  Cardiovascular: Regular rate and rhythm, no murmurs / rubs / gallops. No extremity edema.  Diminished pedal pulses on the left as compared to the right side. No carotid bruits.  Abdomen: no tenderness, no masses palpated. No hepatosplenomegaly. Bowel sounds positive.  Musculoskeletal: no clubbing / cyanosis. No joint deformity upper and lower extremities. Good ROM, no contractures. Normal muscle tone.  Skin: no rashes, lesions, ulcers. No induration.  Ulcer on the skin between 3rd and 4th toes, clean base, no significant discharges Neurologic: CN 2-12 grossly intact. Sensation intact, DTR normal.  Muscle strength 5/5 on both sides Psychiatric: Normal judgment and insight. Alert and oriented x 3. Normal mood.    Data Reviewed:  Recent blood work including CBC BMP and recent hospital record as well as clinical record from hematology reviewed.  Family Communication: None at bedside Primary team communication: Cardiology Thank you very much for involving us  in the care of  your patient.  Author: Cort ONEIDA Mana, MD 03/23/2024 4:02 PM  For on call review [email or web address].     [1]  Allergies Allergen Reactions   Losartan Potassium Swelling    angioedema Other reaction(s): Angioedema   Shellfish Allergy Swelling and Rash    Rash, itching, swelling   Currently eats SHELLFISH without symptoms   Shellfish Protein-Containing Drug Products Dermatitis and Swelling    Rash, itching, swelling    Rash, itching, swelling   Currently eats SHELLFISH without symptoms   Doxycycline  Other (See Comments)    Blisters on hand   Ultram  [Tramadol Hcl] Nausea And Vomiting   Celecoxib Itching, Swelling, Rash and Other (See Comments)    Cannot take when eating shellfish   Codeine Palpitations    Heart racing   Cyclobenzaprine Other (See Comments)    Musculoskeletal therapy agents cause Excessive sedation Musculoskeletal therapy agents cause excessive sedation   Tramadol Hcl Nausea And Vomiting

## 2024-03-23 NOTE — Progress Notes (Signed)
 PHARMACY - ANTICOAGULATION CONSULT NOTE  Pharmacy Consult for Heparin Indication: chest pain/ACS  Allergies[1]  Patient Measurements: Height: 5' 7 (170.2 cm) Weight: 65.7 kg (144 lb 14.4 oz) IBW/kg (Calculated) : 66.1 HEPARIN DW (KG): 65.7  Vital Signs: Temp: 97.6 F (36.4 C) (12/12 1658) Temp Source: Temporal (12/12 1615) BP: 148/80 (12/12 1658) Pulse Rate: 72 (12/12 1658)  Labs: Recent Labs    03/21/24 1001 03/23/24 1335 03/23/24 1338  HGB 13.4 16.0 16.0  HCT 44.9 47.0 47.0  PLT 592*  --   --   CREATININE 2.43*  --   --     Estimated Creatinine Clearance: 20.7 mL/min (A) (by C-G formula based on SCr of 2.43 mg/dL (H)).   Medical History: Past Medical History:  Diagnosis Date   Acute deep vein thrombosis (DVT) of right femoral vein (HCC) 10/28/2020   US  10/24/2020 [facility] Acute right lower extremity DVT extending from the common femoral vein to  the popliteal vein.     Bladder cancer (HCC)    Genital herpes    Hyperglycemia    Hypertension    Squamous cell carcinoma     Medications:  Medications Prior to Admission  Medication Sig Dispense Refill Last Dose/Taking   clopidogrel  (PLAVIX ) 75 MG tablet Take 75 mg by mouth daily.   03/20/2024   furosemide  (LASIX ) 20 MG tablet Take 2 tablets (40 mg total) by mouth daily. (Patient taking differently: Take 20 mg by mouth daily.)   03/20/2024   hydroxyurea  (HYDREA ) 500 MG capsule Take 500 mg by mouth daily.   03/20/2024   levothyroxine  (SYNTHROID ) 50 MCG tablet Take 1 tablet (50 mcg total) by mouth daily. PLEASE NOTE CHANGE TO AND CANCEL ANY REMAINING REFILLS OF THE 25s 90 tablet 0 03/20/2024   Multiple Vitamins-Minerals (MULTIVITAMIN ADULT PO) Take 1 tablet by mouth daily.   03/20/2024   oxyCODONE -acetaminophen  (PERCOCET) 10-325 MG tablet Take 1 tablet by mouth every 6 (six) hours as needed. for pain 120 tablet 0 03/20/2024   amiodarone  (PACERONE ) 200 MG tablet Take 1 tablet (200 mg total) by mouth daily. 30 tablet 0  03/20/2024   apixaban  (ELIQUIS ) 2.5 MG TABS tablet Take 1 tablet (2.5 mg total) by mouth 2 (two) times daily. 60 tablet 0 03/20/2024   apixaban  (ELIQUIS ) 5 MG TABS tablet Take 5 mg by mouth daily.      ferrous sulfate  325 (65 FE) MG tablet Take 325 mg by mouth daily.   03/20/2024   metoprolol  succinate (TOPROL -XL) 50 MG 24 hr tablet Take 50 mg by mouth.   03/20/2024   sodium bicarbonate  650 MG tablet Take 1 tablet (650 mg total) by mouth daily. 60 tablet 1 03/20/2024   valACYclovir  (VALTREX ) 1000 MG tablet TAKE 1 TABLET BY MOUTH TWICE A DAY AS NEEDED 15 tablet 4 Not Taking   Scheduled:   amiodarone   200 mg Oral Daily   clopidogrel   75 mg Oral Daily   ferrous sulfate   325 mg Oral Daily   [START ON 03/24/2024] hydroxyurea   500 mg Oral Daily   [START ON 03/24/2024] levothyroxine   50 mcg Oral Q0600   [START ON 03/24/2024] metoprolol  succinate  12.5 mg Oral Daily   sodium bicarbonate   1,300 mg Oral BID   sodium chloride  flush  3 mL Intravenous Q12H   Infusions:   sodium chloride  50 mL/hr at 03/23/24 1502   sodium chloride      PRN: sodium chloride , oxyCODONE  **AND** acetaminophen , acetaminophen , hydrALAZINE , ondansetron  (ZOFRAN ) IV, sodium chloride  flush Anti-infectives (From admission,  onward)    None       Assessment: Patient is a 83 yo M presenting for right/left heart catheterization ad coronary angiography. PMH significant for essential hypertension, recurrent vertigo, polycythemia vera, hypothyroidism, history of DVTs, atrial fibrillation and flutter, history of bladder cancer peripheral arterial disease, and UTI. Pharmacy is consulted for Heparin dosing and monitoring s/p heart cath and angiography. Per cardiology MD, plan is to start Heparin infusion 8 hours post sheath removal. No initial bolus needed per MD. Sheath removed at ~16:20.   Baseline CBC, INR, and aPTT in progress  Goal of Therapy:  Heparin level 0.3-0.7 units/ml Monitor platelets by anticoagulation protocol: Yes    Plan:  - Will start Heparin gtt rate at 800 units/hr on 12/13 00:30 (8 hours after sheath-removal) - Will check HL in 8 hours after infusion start - Will continue to monitor CBC daily    Larene Ascencio M Dennise Bamber 03/23/2024,4:59 PM      [1]  Allergies Allergen Reactions   Losartan Potassium Swelling    angioedema Other reaction(s): Angioedema   Shellfish Allergy Swelling and Rash    Rash, itching, swelling   Currently eats SHELLFISH without symptoms   Shellfish Protein-Containing Drug Products Dermatitis and Swelling    Rash, itching, swelling    Rash, itching, swelling   Currently eats SHELLFISH without symptoms   Doxycycline  Other (See Comments)    Blisters on hand   Ultram  [Tramadol Hcl] Nausea And Vomiting   Celecoxib Itching, Swelling, Rash and Other (See Comments)    Cannot take when eating shellfish   Codeine Palpitations    Heart racing   Cyclobenzaprine Other (See Comments)    Musculoskeletal therapy agents cause Excessive sedation Musculoskeletal therapy agents cause excessive sedation   Tramadol Hcl Nausea And Vomiting

## 2024-03-23 NOTE — Consult Note (Signed)
 [facility] Vascular Consult Note  MRN : [id number]  Jerry Mcpherson is a 83 y.o. (09-Oct-1940) male who presents with chief complaint of No chief complaint on file. SABRA  History of Present Illness: I am asked to see the patient by Dr. Mady for evaluation of peripheral arterial disease with nonhealing ulceration and suspected rest pain of the left foot.  The patient has a known history of peripheral arterial disease and has had percutaneous interventions to both lower extremities and another institution previously.  The last noninvasive studies that I see are from March of this year where he had a normal ABI on the right and a mildly reduced ABI on the left of 0.85.  His waveforms were fairly poor however so I would question some possible false elevation from medial calcification as he also had severe tibial disease on his noninvasive study.  He has been going to the wound care center and dealing with this wound between his 3rd and 4th toes now for several months.  He is having pain at rest in that foot.  His walking and activity level are limited largely due to his multiple medical comorbidities but the left foot and leg pain also limit this.  He has had worsening leukocytosis and his cardiologist astutely noted the nonhealing wound with history of peripheral arterial disease and asked us  to see him in consultation.  Current Facility-Administered Medications  Medication Dose Route Frequency Provider Last Rate Last Admin   0.9 %  sodium chloride  infusion   Intravenous Continuous End, Lonni, MD 50 mL/hr at 03/23/24 1502 Continued from Pre-op at 03/23/24 1502   0.9 %  sodium chloride  infusion  250 mL Intravenous PRN End, Lonni, MD       acetaminophen  (TYLENOL ) tablet 650 mg  650 mg Oral Q4H PRN End, Lonni, MD       amiodarone  (PACERONE ) tablet 200 mg  200 mg Oral Daily End, Lonni, MD       clopidogrel  (PLAVIX ) tablet 75 mg  75 mg Oral Daily End,  Christopher, MD       ferrous sulfate  tablet 325 mg  325 mg Oral Daily End, Christopher, MD       hydrALAZINE  (APRESOLINE ) injection 10 mg  10 mg Intravenous Q20 Min PRN End, Lonni, MD       NOREEN ON 03/24/2024] hydroxyurea  (HYDREA ) capsule 500 mg  500 mg Oral Daily End, Lonni, MD       NOREEN ON 03/24/2024] levothyroxine  (SYNTHROID ) tablet 50 mcg  50 mcg Oral Q0600 End, Lonni, MD       NOREEN ON 03/24/2024] metoprolol  succinate (TOPROL -XL) 24 hr tablet 12.5 mg  12.5 mg Oral Daily End, Christopher, MD       ondansetron  (ZOFRAN ) injection 4 mg  4 mg Intravenous Q6H PRN End, Lonni, MD       oxyCODONE -acetaminophen  (PERCOCET) 10-325 MG per tablet 1 tablet  1 tablet Oral Q6H PRN End, Lonni, MD       sodium bicarbonate  tablet 650 mg  650 mg Oral Daily End, Christopher, MD       sodium chloride  flush (NS) 0.9 % injection 3 mL  3 mL Intravenous Q12H End, Christopher, MD       sodium chloride  flush (NS) 0.9 % injection 3 mL  3 mL Intravenous PRN End, Lonni, MD        Past Medical History:  Diagnosis Date   Acute deep vein thrombosis (DVT) of right femoral vein (HCC) 10/28/2020  US  10/24/2020 [facility] Acute right lower extremity DVT extending from the common femoral vein to  the popliteal vein.     Bladder cancer (HCC)    Genital herpes    Hyperglycemia    Hypertension    Squamous cell carcinoma     Past Surgical History:  Procedure Laterality Date   CATARACT EXTRACTION Left 10/2012   Dr. Lethea; [facility]   CYSTECTOMY W/ URETEROILEAL CONDUIT  07/24/2020   [facility] for Stage IV bladder cancer extending to prostate   EYE SURGERY Left    removed a piece of steel   FEMORAL ARTERY STENT Left 06/16/2022   [facility] stent[facility]   HERNIA REPAIR  12/08/2006   umbilical hernia, incarderated; Dr. 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 Former smoker No alcohol use   Family History  Problem Relation Age of Onset   Hypertension Mother    Heart attack Mother    Stroke Father    Cancer Father    COPD Sister    Congestive Heart Failure Brother     Allergies[1]   REVIEW OF SYSTEMS (Negative unless checked)  Constitutional: [x] Weight loss  [] Fever  [] Chills Cardiac: [] Chest pain   [] Chest pressure   [x] Palpitations   [] Shortness of breath when laying flat   [] Shortness of breath at rest   [x] Shortness of breath with exertion. Vascular:  [x] Pain in legs with walking   [] Pain in legs at rest   [] Pain in legs when laying flat   [] Claudication   [] Pain in feet when walking  [x] Pain in feet at rest  [] Pain in feet when laying flat   [x] History of DVT   [] Phlebitis   [] Swelling in legs   [] Varicose veins   [x] Non-healing ulcers Pulmonary:   [] Uses home oxygen   [] Productive cough   [] Hemoptysis   [] Wheeze  [] COPD   [] Asthma Neurologic:  [] Dizziness  [] Blackouts   [] Seizures   [] History of stroke   [] History of TIA  [] Aphasia   [] Temporary blindness   [] Dysphagia   [] Weakness or numbness in arms   [] Weakness or numbness in legs Musculoskeletal:  [x] Arthritis   [] Joint swelling   [x] Joint pain   [] Low back pain Hematologic:  [] Easy bruising  [] Easy bleeding   [] Hypercoagulable state   [] Anemic  [] Hepatitis Gastrointestinal:  [] Blood in stool   [] Vomiting blood  [] Gastroesophageal reflux/heartburn   [] Difficulty swallowing. Genitourinary:  [] Chronic kidney disease   [] Difficult urination  [] Frequent urination  [] Burning with urination   [] Blood in urine Skin:  [] Rashes   [x] Ulcers   [x] Wounds Psychological:  [] History of anxiety   []  History of major depression.  Physical Examination  Vitals:   03/23/24 1437 03/23/24 1445 03/23/24 1500 03/23/24 1515  BP:  131/69 130/81 128/80  Pulse: (!) 0 73 73 71  Resp:  (!) 23 14 18    Temp:  (!) 96.9 F (36.1 C)    TempSrc:  Temporal    SpO2:  98% 99% 96%  Weight:      Height:       Body mass index is 22.69 kg/m. Gen:  WD/WN, NAD Head: [place]/AT, No temporalis wasting.  Ear/Nose/Throat: Hearing grossly intact, nares w/o erythema or drainage, oropharynx w/o Erythema/Exudate Eyes:  Sclera non-icteric, conjunctiva clear Neck: Trachea midline.   Pulmonary:  Good air movement, respirations not labored, equal bilaterally.  Cardiac: RRR, no JVD Vascular:  Vessel Right Left  Radial TR band in place Palpable                          PT 1+ Palpable Not Palpable  DP 1+ Palpable Not Palpable   Gastrointestinal: soft, non-tender/non-distended. No guarding/reflex.  Musculoskeletal: M/S 5/5 throughout.  No deformity or atrophy.  1+ right lower extremity edema, 1-2+ left lower extremity Neurologic: Sensation grossly intact in extremities.  Symmetrical.  Speech is fluent. Motor exam as listed above. Psychiatric: Judgment intact, Mood & affect appropriate for pt's clinical situation. Dermatologic: Small superficial ulceration between toes 3 and 4 on the left foot.  Mild erythema with significant tenderness in the area     CBC Lab Results  Component Value Date   WBC 26.1 (HH) 03/21/2024   HGB 16.0 03/23/2024   HCT 47.0 03/23/2024   MCV 85 03/21/2024   PLT 592 (H) 03/21/2024    BMET    Component Value Date/Time   NA 142 03/23/2024 1338   NA 141 03/21/2024 1001   K 4.5 03/23/2024 1338   CL 106 03/21/2024 1001   CO2 18 (L) 03/21/2024 1001   GLUCOSE 102 (H) 03/21/2024 1001   GLUCOSE 109 (H) 12/20/2023 0545   BUN 52 (H) 03/21/2024 1001   CREATININE 2.43 (H) 03/21/2024 1001   CALCIUM  9.0 03/21/2024 1001   GFRNONAA 38 (L) 12/20/2023 0545   GFRAA 72 08/26/2017 1144   Estimated Creatinine Clearance: 20.7 mL/min (A) (by C-G formula based on SCr of 2.43 mg/dL (H)).  COAG No results found for: INR, PROTIME  Radiology No results  found.    Assessment/Plan 1.  PAD with ulceration left foot.  This represents a critical and limb threatening situation.  I reviewed his previous studies at [facility] about 9 months ago where his ABI was mildly reduced but he had poor waveforms and tibial disease present.  He has had previous revascularization to both lower extremities.  He does not have palpable pedal pulses on the left.  I would recommend an angiogram of the left lower extremity for further evaluation and potential revascularization in hopes of limb salvage.  Given his severe chronic kidney disease, I would see how his renal function does for the next 2 to 3 days after his cardiac catheterization today.  If his creatinine remains stable, we will plan on angiogram on Monday.  Risks and benefits were discussed with the patient in detail and he is agreeable to proceed. 2.  Coronary artery disease with reduced ejection fraction.  Status post cardiac catheterization today.  Has severe disease.  Poor candidate for CABG.  Consideration for percutaneous intervention is still being given. 3.  Chronic kidney disease stage IV.  Gentle hydration and recheck creatinine levels.  If creatinine remains stable, this may allow angiogram of the left lower extremity Monday. 4.  Bladder cancer.  Status post bladder resection with a urostomy   Selinda Gu, MD  03/23/2024 3:25 PM    This note was created with Dragon medical transcription system.  Any error is purely unintentional    [1]  Allergies Allergen Reactions   Losartan Potassium Swelling    angioedema Other reaction(s): Angioedema   Shellfish Allergy Swelling and Rash    Rash, itching, swelling   Currently eats SHELLFISH without symptoms   Shellfish Protein-Containing Drug  Products Dermatitis and Swelling    Rash, itching, swelling    Rash, itching, swelling   Currently eats SHELLFISH without symptoms   Doxycycline  Other (See Comments)    Blisters on hand   Ultram  [Tramadol Hcl]  Nausea And Vomiting   Celecoxib Itching, Swelling, Rash and Other (See Comments)    Cannot take when eating shellfish   Codeine Palpitations    Heart racing   Cyclobenzaprine Other (See Comments)    Musculoskeletal therapy agents cause Excessive sedation Musculoskeletal therapy agents cause excessive sedation   Tramadol Hcl Nausea And Vomiting

## 2024-03-23 NOTE — Plan of Care (Signed)
 X2 Vascular ulcers found on admission to the floor after having Cardiac Cath.  Ulcers on LF foot btwn 3-4 toes and 4-5 toes.  Doc and Vascular made aware and are aiming for possible Angiogram on LF leg on 12/15.  X2 pics placed in chart and WOC nurse informed and placed orders.  Patient has also been visiting the wound clinic as outpatient.

## 2024-03-23 NOTE — Interval H&P Note (Signed)
 History and Physical Interval Note:  03/23/2024 1:09 PM  Jerry Mcpherson  has presented today for surgery, with the diagnosis of HFrEF.  The various methods of treatment have been discussed with the patient and family. After consideration of risks, benefits and other options for treatment, the patient has consented to  Procedures: RIGHT/LEFT HEART CATH AND CORONARY ANGIOGRAPHY (Bilateral) as a surgical intervention.  The patient's history has been reviewed, patient examined, no change in status, stable for surgery.  I have reviewed the patient's chart and labs.  Questions were answered to the patient's satisfaction.    Cath Lab Visit (complete for each Cath Lab visit)  Clinical Evaluation Leading to the Procedure:   ACS: No.  Non-ACS:    Anginal/Heart Failure Classification: NYHA class III  Anti-ischemic medical therapy: Minimal Therapy (1 class of medications)  Non-Invasive Test Results: LVEF 30-35% by echo -> high risk  Prior CABG: No previous CABG  Suman Trivedi

## 2024-03-23 NOTE — Consult Note (Addendum)
 WOC Nurse Consult Note:  patient with history of PAD; [facility] for ulceration Left 3rd digit; using silver  hydrofiber  Reason for Consult: L toe wound  Wound type:full thickness wound webspace L 3rd and 4th digits and 4th and 5th digits r/t PAD  Pressure Injury POA: NA  Measurement: see nursing flowsheet Wound bed: ulceration between 3rd and 4th digits with tan necrotic tissue, between 4th and 5th digits dark necrotic  Drainage (amount, consistency, odor) see nursing flowsheet  Periwound: maceration, peeling skin  Dressing procedure/placement/frequency: Cleanse wounds between L 3rd and 4th digits and 4th and 5th digits with Vashe, do not rinse. Apply a strip of silver  hydrofiber Soila [id number]) to wound beds daily and secure with dry gauze and paper or clothe tape.  May use rectangular silicone foam if available and works better.   POC discussed with bedside nurse.  Appreciate M. Brien, Rn assistance with this consult.  Patient undergoing vascular intervention 03/23/2024.  WOC team will not follow.  Should resume follow-up at [facility] or with vascular as outpatient for ongoing management.  Reconsult if further needs arise.    Thank you,    Powell Bar MSN, RN-BC, [employer]

## 2024-03-24 ENCOUNTER — Observation Stay

## 2024-03-24 DIAGNOSIS — I739 Peripheral vascular disease, unspecified: Secondary | ICD-10-CM | POA: Diagnosis not present

## 2024-03-24 DIAGNOSIS — Z789 Other specified health status: Secondary | ICD-10-CM | POA: Diagnosis not present

## 2024-03-24 DIAGNOSIS — L89312 Pressure ulcer of right buttock, stage 2: Secondary | ICD-10-CM | POA: Diagnosis present

## 2024-03-24 DIAGNOSIS — R64 Cachexia: Secondary | ICD-10-CM | POA: Diagnosis present

## 2024-03-24 DIAGNOSIS — R579 Shock, unspecified: Secondary | ICD-10-CM

## 2024-03-24 DIAGNOSIS — L8990 Pressure ulcer of unspecified site, unspecified stage: Secondary | ICD-10-CM | POA: Insufficient documentation

## 2024-03-24 DIAGNOSIS — I132 Hypertensive heart and chronic kidney disease with heart failure and with stage 5 chronic kidney disease, or end stage renal disease: Secondary | ICD-10-CM | POA: Diagnosis not present

## 2024-03-24 DIAGNOSIS — I480 Paroxysmal atrial fibrillation: Secondary | ICD-10-CM | POA: Diagnosis present

## 2024-03-24 DIAGNOSIS — I4892 Unspecified atrial flutter: Secondary | ICD-10-CM | POA: Diagnosis present

## 2024-03-24 DIAGNOSIS — M25562 Pain in left knee: Secondary | ICD-10-CM | POA: Diagnosis not present

## 2024-03-24 DIAGNOSIS — I959 Hypotension, unspecified: Secondary | ICD-10-CM | POA: Diagnosis not present

## 2024-03-24 DIAGNOSIS — Z936 Other artificial openings of urinary tract status: Secondary | ICD-10-CM | POA: Diagnosis not present

## 2024-03-24 DIAGNOSIS — Z515 Encounter for palliative care: Secondary | ICD-10-CM | POA: Diagnosis not present

## 2024-03-24 DIAGNOSIS — I70249 Atherosclerosis of native arteries of left leg with ulceration of unspecified site: Secondary | ICD-10-CM | POA: Diagnosis not present

## 2024-03-24 DIAGNOSIS — I509 Heart failure, unspecified: Secondary | ICD-10-CM | POA: Diagnosis not present

## 2024-03-24 DIAGNOSIS — Z7902 Long term (current) use of antithrombotics/antiplatelets: Secondary | ICD-10-CM | POA: Diagnosis not present

## 2024-03-24 DIAGNOSIS — N184 Chronic kidney disease, stage 4 (severe): Secondary | ICD-10-CM | POA: Diagnosis not present

## 2024-03-24 DIAGNOSIS — I70245 Atherosclerosis of native arteries of left leg with ulceration of other part of foot: Secondary | ICD-10-CM | POA: Diagnosis present

## 2024-03-24 DIAGNOSIS — E039 Hypothyroidism, unspecified: Secondary | ICD-10-CM

## 2024-03-24 DIAGNOSIS — I4891 Unspecified atrial fibrillation: Secondary | ICD-10-CM | POA: Diagnosis not present

## 2024-03-24 DIAGNOSIS — E874 Mixed disorder of acid-base balance: Secondary | ICD-10-CM | POA: Diagnosis present

## 2024-03-24 DIAGNOSIS — I2510 Atherosclerotic heart disease of native coronary artery without angina pectoris: Secondary | ICD-10-CM | POA: Diagnosis present

## 2024-03-24 DIAGNOSIS — I255 Ischemic cardiomyopathy: Secondary | ICD-10-CM | POA: Diagnosis not present

## 2024-03-24 DIAGNOSIS — I25118 Atherosclerotic heart disease of native coronary artery with other forms of angina pectoris: Secondary | ICD-10-CM | POA: Diagnosis not present

## 2024-03-24 DIAGNOSIS — I5042 Chronic combined systolic (congestive) and diastolic (congestive) heart failure: Secondary | ICD-10-CM | POA: Diagnosis present

## 2024-03-24 DIAGNOSIS — L97929 Non-pressure chronic ulcer of unspecified part of left lower leg with unspecified severity: Secondary | ICD-10-CM | POA: Diagnosis not present

## 2024-03-24 DIAGNOSIS — C678 Malignant neoplasm of overlapping sites of bladder: Secondary | ICD-10-CM | POA: Diagnosis present

## 2024-03-24 DIAGNOSIS — I350 Nonrheumatic aortic (valve) stenosis: Secondary | ICD-10-CM | POA: Diagnosis not present

## 2024-03-24 DIAGNOSIS — I130 Hypertensive heart and chronic kidney disease with heart failure and stage 1 through stage 4 chronic kidney disease, or unspecified chronic kidney disease: Secondary | ICD-10-CM | POA: Diagnosis present

## 2024-03-24 DIAGNOSIS — L97529 Non-pressure chronic ulcer of other part of left foot with unspecified severity: Secondary | ICD-10-CM | POA: Diagnosis present

## 2024-03-24 DIAGNOSIS — D45 Polycythemia vera: Secondary | ICD-10-CM | POA: Diagnosis present

## 2024-03-24 DIAGNOSIS — I483 Typical atrial flutter: Secondary | ICD-10-CM | POA: Diagnosis present

## 2024-03-24 DIAGNOSIS — I5022 Chronic systolic (congestive) heart failure: Secondary | ICD-10-CM | POA: Diagnosis not present

## 2024-03-24 DIAGNOSIS — I70222 Atherosclerosis of native arteries of extremities with rest pain, left leg: Secondary | ICD-10-CM | POA: Diagnosis present

## 2024-03-24 DIAGNOSIS — I501 Left ventricular failure: Secondary | ICD-10-CM | POA: Diagnosis not present

## 2024-03-24 DIAGNOSIS — Z9889 Other specified postprocedural states: Secondary | ICD-10-CM | POA: Diagnosis not present

## 2024-03-24 DIAGNOSIS — Z7901 Long term (current) use of anticoagulants: Secondary | ICD-10-CM | POA: Diagnosis not present

## 2024-03-24 DIAGNOSIS — N179 Acute kidney failure, unspecified: Secondary | ICD-10-CM | POA: Diagnosis present

## 2024-03-24 DIAGNOSIS — I5020 Unspecified systolic (congestive) heart failure: Secondary | ICD-10-CM | POA: Diagnosis not present

## 2024-03-24 DIAGNOSIS — N2581 Secondary hyperparathyroidism of renal origin: Secondary | ICD-10-CM | POA: Diagnosis present

## 2024-03-24 DIAGNOSIS — I110 Hypertensive heart disease with heart failure: Secondary | ICD-10-CM | POA: Diagnosis not present

## 2024-03-24 DIAGNOSIS — F32A Depression, unspecified: Secondary | ICD-10-CM | POA: Diagnosis present

## 2024-03-24 DIAGNOSIS — R578 Other shock: Secondary | ICD-10-CM | POA: Diagnosis not present

## 2024-03-24 DIAGNOSIS — I080 Rheumatic disorders of both mitral and aortic valves: Secondary | ICD-10-CM | POA: Diagnosis present

## 2024-03-24 DIAGNOSIS — I998 Other disorder of circulatory system: Secondary | ICD-10-CM | POA: Diagnosis not present

## 2024-03-24 DIAGNOSIS — Z7189 Other specified counseling: Secondary | ICD-10-CM | POA: Diagnosis not present

## 2024-03-24 LAB — MRSA NEXT GEN BY PCR, NASAL: MRSA by PCR Next Gen: NOT DETECTED

## 2024-03-24 LAB — BASIC METABOLIC PANEL WITH GFR
Anion gap: 11 (ref 5–15)
BUN: 45 mg/dL — ABNORMAL HIGH (ref 8–23)
CO2: 20 mmol/L — ABNORMAL LOW (ref 22–32)
Calcium: 8.8 mg/dL — ABNORMAL LOW (ref 8.9–10.3)
Chloride: 109 mmol/L (ref 98–111)
Creatinine, Ser: 2.26 mg/dL — ABNORMAL HIGH (ref 0.61–1.24)
GFR, Estimated: 28 mL/min — ABNORMAL LOW (ref >60)
Glucose, Bld: 99 mg/dL (ref 70–99)
Potassium: 4.5 mmol/L (ref 3.5–5.1)
Sodium: 140 mmol/L (ref 135–145)

## 2024-03-24 LAB — CBC
HCT: 45.9 % (ref 39.0–52.0)
Hemoglobin: 13.6 g/dL (ref 13.0–17.0)
MCH: 25.2 pg — ABNORMAL LOW (ref 26.0–34.0)
MCHC: 29.6 g/dL — ABNORMAL LOW (ref 30.0–36.0)
MCV: 85.2 fL (ref 80.0–100.0)
Platelets: 556 K/uL — ABNORMAL HIGH (ref 150–400)
RBC: 5.39 MIL/uL (ref 4.22–5.81)
RDW: 23.6 % — ABNORMAL HIGH (ref 11.5–15.5)
WBC: 24.5 K/uL — ABNORMAL HIGH (ref 4.0–10.5)
nRBC: 0 % (ref 0.0–0.2)

## 2024-03-24 LAB — MAGNESIUM: Magnesium: 2 mg/dL (ref 1.7–2.4)

## 2024-03-24 LAB — HEPARIN LEVEL (UNFRACTIONATED)
Heparin Unfractionated: <0.1 [IU]/mL — ABNORMAL LOW (ref 0.30–0.70)
Heparin Unfractionated: <0.1 [IU]/mL — ABNORMAL LOW (ref 0.30–0.70)

## 2024-03-24 LAB — POTASSIUM: Potassium: 4.7 mmol/L (ref 3.5–5.1)

## 2024-03-24 MED ORDER — AMIODARONE LOAD VIA INFUSION
150.0000 mg | Freq: Once | INTRAVENOUS | Status: DC
  Filled 2024-03-24: qty 83.34

## 2024-03-24 MED ORDER — CIPROFLOXACIN HCL 0.3 % OP SOLN
2.0000 [drp] | OPHTHALMIC | Status: AC
  Administered 2024-03-24 – 2024-03-27 (×14): 2 [drp] via OPHTHALMIC
  Filled 2024-03-24 (×3): qty 2.5

## 2024-03-24 MED ORDER — ATORVASTATIN CALCIUM 80 MG PO TABS
80.0000 mg | ORAL_TABLET | Freq: Every day | ORAL | Status: DC
  Administered 2024-03-24 – 2024-03-31 (×8): 80 mg via ORAL
  Filled 2024-03-24 (×8): qty 1

## 2024-03-24 MED ORDER — AMIODARONE HCL IN DEXTROSE 360-4.14 MG/200ML-% IV SOLN
30.0000 mg/h | INTRAVENOUS | Status: DC
  Administered 2024-03-25 – 2024-03-27 (×4): 30 mg/h via INTRAVENOUS
  Filled 2024-03-24 (×5): qty 200

## 2024-03-24 MED ORDER — SODIUM CHLORIDE 0.9 % IV SOLN: INTRAVENOUS | Status: AC

## 2024-03-24 MED ORDER — CHLORHEXIDINE GLUCONATE CLOTH 2 % EX PADS
6.0000 | MEDICATED_PAD | Freq: Every day | CUTANEOUS | Status: DC
  Administered 2024-03-24 – 2024-03-30 (×7): 6 via TOPICAL

## 2024-03-24 MED ORDER — AMIODARONE LOAD VIA INFUSION
150.0000 mg | Freq: Once | INTRAVENOUS | Status: AC
  Administered 2024-03-24: 150 mg via INTRAVENOUS
  Filled 2024-03-24: qty 83.34

## 2024-03-24 MED ORDER — NOREPINEPHRINE 4 MG/250ML-% IV SOLN: 0.0000 ug/min | INTRAVENOUS | Status: DC

## 2024-03-24 MED ORDER — NOREPINEPHRINE 4 MG/250ML-% IV SOLN
0.0000 ug/min | INTRAVENOUS | Status: DC
  Administered 2024-03-24: 2 ug/min via INTRAVENOUS
  Filled 2024-03-24: qty 250

## 2024-03-24 MED ORDER — AMIODARONE HCL IN DEXTROSE 360-4.14 MG/200ML-% IV SOLN
60.0000 mg/h | INTRAVENOUS | Status: DC
  Administered 2024-03-24 (×2): 60 mg/h via INTRAVENOUS
  Filled 2024-03-24: qty 400

## 2024-03-24 MED ORDER — ASPIRIN 81 MG PO TBEC
81.0000 mg | DELAYED_RELEASE_TABLET | Freq: Every day | ORAL | Status: DC
  Administered 2024-03-24: 81 mg via ORAL
  Filled 2024-03-24: qty 1

## 2024-03-24 MED ORDER — AMIODARONE HCL IN DEXTROSE 360-4.14 MG/200ML-% IV SOLN: 30.0000 mg/h | INTRAVENOUS | Status: DC

## 2024-03-24 MED ORDER — HEPARIN BOLUS VIA INFUSION
2000.0000 [IU] | Freq: Once | INTRAVENOUS | Status: AC
  Administered 2024-03-24: 2000 [IU] via INTRAVENOUS
  Filled 2024-03-24: qty 2000

## 2024-03-24 MED ORDER — AMIODARONE HCL IN DEXTROSE 360-4.14 MG/200ML-% IV SOLN
60.0000 mg/h | INTRAVENOUS | Status: DC
  Filled 2024-03-24: qty 200

## 2024-03-24 MED ORDER — AMIODARONE HCL 200 MG PO TABS
400.0000 mg | ORAL_TABLET | Freq: Two times a day (BID) | ORAL | Status: DC
  Administered 2024-03-24: 400 mg via ORAL
  Filled 2024-03-24: qty 2

## 2024-03-24 MED ORDER — FLUTICASONE FUROATE-VILANTEROL 100-25 MCG/ACT IN AEPB
1.0000 | INHALATION_SPRAY | Freq: Every day | RESPIRATORY_TRACT | Status: DC
  Administered 2024-03-26: 09:00:00 1 via RESPIRATORY_TRACT
  Filled 2024-03-24 (×2): qty 28

## 2024-03-24 MED ORDER — SODIUM CHLORIDE 0.9 % IV BOLUS
500.0000 mL | Freq: Once | INTRAVENOUS | Status: AC
  Administered 2024-03-24: 500 mL via INTRAVENOUS

## 2024-03-24 NOTE — Plan of Care (Signed)
  Problem: Education: Goal: Understanding of CV disease, CV risk reduction, and recovery process will improve Outcome: Not Progressing Goal: Individualized Educational Video(s) Outcome: Not Progressing   Problem: Activity: Goal: Ability to return to baseline activity level will improve Outcome: Not Progressing   Problem: Cardiovascular: Goal: Ability to achieve and maintain adequate cardiovascular perfusion will improve Outcome: Not Progressing Goal: Vascular access site(s) Level 0-1 will be maintained Outcome: Not Progressing   Problem: Health Behavior/Discharge Planning: Goal: Ability to safely manage health-related needs after discharge will improve Outcome: Not Progressing   Problem: Education: Goal: Knowledge of General Education information will improve Description: Including pain rating scale, medication(s)/side effects and non-pharmacologic comfort measures Outcome: Not Progressing   Problem: Health Behavior/Discharge Planning: Goal: Ability to manage health-related needs will improve Outcome: Not Progressing   Problem: Clinical Measurements: Goal: Ability to maintain clinical measurements within normal limits will improve Outcome: Not Progressing Goal: Will remain free from infection Outcome: Not Progressing Goal: Diagnostic test results will improve Outcome: Not Progressing Goal: Respiratory complications will improve Outcome: Not Progressing Goal: Cardiovascular complication will be avoided Outcome: Not Progressing   Problem: Activity: Goal: Risk for activity intolerance will decrease Outcome: Not Progressing   Problem: Nutrition: Goal: Adequate nutrition will be maintained Outcome: Not Progressing   Problem: Coping: Goal: Level of anxiety will decrease Outcome: Not Progressing   Problem: Elimination: Goal: Will not experience complications related to bowel motility Outcome: Not Progressing Goal: Will not experience complications related to urinary  retention Outcome: Not Progressing   Problem: Pain Managment: Goal: General experience of comfort will improve and/or be controlled Outcome: Not Progressing   Problem: Safety: Goal: Ability to remain free from injury will improve Outcome: Not Progressing   Problem: Skin Integrity: Goal: Risk for impaired skin integrity will decrease Outcome: Not Progressing

## 2024-03-24 NOTE — Consult Note (Addendum)
 NAME:  Nahiem Dredge, MRN:  [id number], DOB:  [birth year], LOS: 0 ADMISSION DATE:  03/23/2024, CONSULTATION DATE:  03/24/24 REFERRING MD:  Laquetta Fairly, MD CHIEF COMPLAINT:  Aflutter with RVR, hypotension   History of Present Illness:  Lelon Ikard is a 83 year old gentleman who presented for planned left and right heart cath after having a syncopal event.  The patient's medical history is significant for HFrEF, A-fib (on Eliquis ) likely related to valvular dysfunction (moderate aortic stenosis, mild to moderate mitral valve regurgitation), polycythemia vera, hypertension, DVT right femoral vein 2022, CKD (ScR ~2.0), squamous cell carcinoma, bladder cancer status post resection with urostomy, hypothyroidism, PAD (S/P SFA stent, TBI 0.12, collateral circulation at ankle), and radiculopathy.  The cardiac catheterization on 03/23/2024 demonstrated severe left main coronary disease with 70% proximal stenosis, normal left and right heart filling pressures and reduced cardiac output at 1.8L/min Fick.  Due to the location of his stenosis, no intervention was performed and the decision was made to optimize on GDMT for heart failure while undergoing further evaluation of his left limb ischemia.  This has been a known problem and he has been evaluated as an outpatient for care.  There are plans to have patient undergo an angiogram to evaluate his limb perfusion.  The patient now presents to the ICU for monitoring and possible intervention for A-flutter with RVR and hypotension.  Pertinent  Medical History  As above in HPI  Significant Hospital Events: Including procedures, antibiotic start and stop dates in addition to other pertinent events   03/23/2024: Right/left heart cath with significant left main disease, no intervention performed. 03/24/2024: Developed a flutter with RVR, attempted oral amiodarone  along with fluid bolus for hypotension unsuccessful.  Consult PCCM for evaluation. Started on  amio bolus + gtt, levophed  to support pressure  Interim History / Subjective:  Disgruntled about hospital course, otherwise no complaints  Objective    Blood pressure (!) 84/68, pulse 66, temperature 98.1 F (36.7 C), temperature source Oral, resp. rate 20, height 5' 7 (1.702 m), weight 65.7 kg, SpO2 92%.        Intake/Output Summary (Last 24 hours) at 03/24/2024 1341 Last data filed at 03/24/2024 1115 Gross per 24 hour  Intake 868.33 ml  Output 1740 ml  Net -871.67 ml   Filed Weights   03/23/24 0909  Weight: 65.7 kg    Examination: General: NAD, pleasant conversant HENT: No lymphadenopathy, dark red scars to nose, OP clear Lungs: Clear to auscultation, normal work of breathing on room air Cardiovascular: Irregular heart rhythm, tachycardic.  Doppler signals bilateral DP left less than right Abdomen: Concave, soft, nontender, bowel sounds present. Extremities: Warm throughout, left foot with Kerlix dressing clean dry and intact.  Mepilex applied to sacrum. Right radial, right femoral access sites with dressing c/d/I, no hematoma Neuro: Alert and oriented x 4, follows commands GU: Ostomy right lower quadrant beefy red, moist, nonedematous.  Clear yellow urine via ostomy bag  Resolved problem list  N/A  Assessment and Plan   #Atrial flutter with RVR #Hypotension Patient with history of atrial arrhythmia, was previously compliant on DOAC as outpatient.  Deferring cardioversion due to significant cardiac history and risk associated with procedure - Heparin  drip - Oral Amio given on floor followed by amnio bolus 150 mg + infusion per protocol - Support blood pressure with Levophed  infusion to maintain MAP greater than 65 - Maintain magnesium greater than or equal to 2.0, potassium greater than or equal to 4.0 - Sending thyroid   panel  #CAD #Left main coronary artery disease 70% stenosis (left/right heart cath 12/12) - Cardiology following, may consider transfer to [facility] for stenting at interval pending chemical cardioversion and treatment of left foot wound, not surgical candidate - Heparin  drip, titrated per pharmacy - Plavix  75 mg daily, home aspirin  - Palliative care consulted by [facility] - EKG daily  #HTN - holding antihypertensives (hydrochlorothiazide , metoprolol )  #Hypothyroidism - Synthroid  50 mg daily - repeat thyroid  panel ordered  #Chronic left toe wound (3rd-4th) #PAD -daily vashe cleanses with hydrofiber and kerlix wrap -plan for angiogram Monday 12/15 to reevaluate perfusion  #Sacral wound - mepilex applied - wound consult to extend to evaluation of wound  #Hx Bladder Cancer #Cystectomy with urostomy - strict I&O - ostomy care per wound nurse/nursing   Labs   CBC: Recent Labs  Lab 03/21/24 1001 03/23/24 1335 03/23/24 1338 03/23/24 1654 03/24/24 0346  WBC 26.1*  --   --  23.3* 24.5*  NEUTROABS  --   --   --  19.1*  --   HGB 13.4 16.0 16.0 16.0 13.6  HCT 44.9 47.0 47.0 52.3* 45.9  MCV 85  --   --  83.7 85.2  PLT 592*  --   --  579* 556*    Basic Metabolic Panel: Recent Labs  Lab 03/21/24 1001 03/23/24 1335 03/23/24 1338 03/24/24 0346  NA 141 143 142 140  K 4.9 4.4 4.5 4.5  CL 106  --   --  109  CO2 18*  --   --  20*  GLUCOSE 102*  --   --  99  BUN 52*  --   --  45*  CREATININE 2.43*  --   --  2.26*  CALCIUM  9.0  --   --  8.8*   GFR: Estimated Creatinine Clearance: 22.2 mL/min (A) (by C-G formula based on SCr of 2.26 mg/dL (H)). Recent Labs  Lab 03/21/24 1001 03/23/24 1654 03/24/24 0346  WBC 26.1* 23.3* 24.5*  LATICACIDVEN  --  1.6  --     Liver Function Tests: No results for input(s): AST, ALT, ALKPHOS, BILITOT, PROT, ALBUMIN in the last 168 hours. No results for input(s): LIPASE, AMYLASE in the last 168 hours. No results for input(s): AMMONIA in the last 168 hours.  ABG    Component Value Date/Time   PHART 7.472 (H) 03/23/2024 1338   PCO2ART 19.2 (LL) 03/23/2024 1338    PO2ART 101 03/23/2024 1338   HCO3 14.0 (L) 03/23/2024 1338   TCO2 15 (L) 03/23/2024 1338   ACIDBASEDEF 7.0 (H) 03/23/2024 1338   O2SAT 98 03/23/2024 1338     Coagulation Profile: Recent Labs  Lab 03/23/24 1654  INR 1.2    Cardiac Enzymes: No results for input(s): CKTOTAL, CKMB, CKMBINDEX, TROPONINI in the last 168 hours.  HbA1C: Hemoglobin A1C  Date/Time Value Ref Range Status  08/18/2020 10:07 AM 5.3 4.0 - 5.6 % Final  02/18/2020 11:00 AM 5.8 (A) 4.0 - 5.6 % Final    CBG: No results for input(s): GLUCAP in the last 168 hours.  Review of Systems:   As above  Past Medical History:  He,  has a past medical history of Acute deep vein thrombosis (DVT) of right femoral vein (HCC) (10/28/2020), Bladder cancer (HCC), Genital herpes, Hyperglycemia, Hypertension, and Squamous cell carcinoma.   Surgical History:   Past Surgical History:  Procedure Laterality Date   CATARACT EXTRACTION Left 10/2012   Dr. Lethea; [facility]   CYSTECTOMY W/ URETEROILEAL  CONDUIT  07/24/2020   [facility] for Stage IV bladder cancer extending to prostate   EYE SURGERY Left    removed a piece of steel   FEMORAL ARTERY STENT Left 06/16/2022   S[facility] stenting at [facility]   HERNIA REPAIR  12/08/2006   umbilical hernia, incarderated; Dr. Dessa   PROSTATECTOMY  4/14/2-22   [facility] for Stage IV bladder cancer extending to prostate   ROTATOR CUFF REPAIR     SQUAMOUS CELL CARCINOMA EXCISION  2008   TONSILLECTOMY       Social History:   reports that he quit smoking about 55 years ago. His smoking use included cigarettes. He started smoking about 70 years ago. He has a 45 pack-year smoking history. He has never used smokeless tobacco. He reports that he does not drink alcohol and does not use drugs.   Family History:  His family history includes COPD in his sister; Cancer in his father; Congestive Heart Failure in his brother; Heart attack in his mother; Hypertension in his mother; Stroke in  his father.   Allergies Allergies[1]   Home Medications  Prior to Admission medications  Medication Sig Start Date End Date Taking? Authorizing Provider  clopidogrel  (PLAVIX ) 75 MG tablet Take 75 mg by mouth daily.   Yes [provider]  furosemide  (LASIX ) 20 MG tablet Take 2 tablets (40 mg total) by mouth daily. Patient taking differently: Take 20 mg by mouth daily. 03/05/24  Yes Darliss Rogue, MD  hydroxyurea  (HYDREA ) 500 MG capsule Take 500 mg by mouth daily.   Yes [provider]  levothyroxine  (SYNTHROID ) 50 MCG tablet Take 1 tablet (50 mcg total) by mouth daily. PLEASE NOTE CHANGE TO AND CANCEL ANY REMAINING REFILLS OF THE 25s 02/02/24  Yes Fisher, Nancyann BRAVO, MD  Multiple Vitamins-Minerals (MULTIVITAMIN ADULT PO) Take 1 tablet by mouth daily.   Yes [provider]  oxyCODONE -acetaminophen  (PERCOCET) 10-325 MG tablet Take 1 tablet by mouth every 6 (six) hours as needed. for pain 03/01/24  Yes [name], MD  amiodarone  (PACERONE ) 200 MG tablet Take 1 tablet (200 mg total) by mouth daily. 03/12/24   [name], MD  apixaban  (ELIQUIS ) 2.5 MG TABS tablet Take 1 tablet (2.5 mg total) by mouth 2 (two) times daily. 12/20/23   Alexander, Natalie, DO  apixaban  (ELIQUIS ) 5 MG TABS tablet Take 5 mg by mouth daily.    [provider]  ferrous sulfate  325 (65 FE) MG tablet Take 325 mg by mouth daily.    [provider]  metoprolol  succinate (TOPROL -XL) 50 MG 24 hr tablet Take 50 mg by mouth. 01/01/24   [provider]  sodium bicarbonate  650 MG tablet Take 1 tablet (650 mg total) by mouth daily. 03/14/24   [name], MD  valACYclovir  (VALTREX ) 1000 MG tablet TAKE 1 TABLET BY MOUTH TWICE A DAY AS NEEDED 10/02/23   [name], MD     Critical care time: 45 minutes    Hazaiah Edgecombe, ACNP-BC Pulmonary Critical Care, [place] Phone: [phone]           [1]  Allergies Allergen Reactions   Losartan  Potassium Swelling    angioedema Other reaction(s): Angioedema   Shellfish Allergy Swelling and Rash    Rash, itching, swelling   Currently eats SHELLFISH without symptoms   Shellfish Protein-Containing Drug Products Dermatitis and Swelling    Rash, itching, swelling    Rash, itching, swelling   Currently eats SHELLFISH without symptoms   Doxycycline   Other (See Comments)    Blisters on hand   Ultram  [Tramadol Hcl] Nausea And Vomiting   Celecoxib Itching, Swelling, Rash and Other (See Comments)    Cannot take when eating shellfish   Codeine Palpitations    Heart racing   Cyclobenzaprine Other (See Comments)    Musculoskeletal therapy agents cause Excessive sedation Musculoskeletal therapy agents cause excessive sedation   Tramadol Hcl Nausea And Vomiting

## 2024-03-24 NOTE — Progress Notes (Addendum)
 1      PROGRESS NOTE    Jerry Mcpherson  FMW:[id number] DOB: 31-Aug-1940 DOA: 03/23/2024 PCP: [name], MD   Brief Narrative:  83 y.o. male with past medical history of recurrent auricles, chronic HFrEF with LVEF 30-35%, HTN, hypothyroidism, DVT, PAF on Eliquis , bladder cancer status post cystectomy and chronic urostomy, PVD, who has come in today for elective LHC.   12/12: Vascular surgery, cardiology consultation, right/left heart cath  12/13: Transfer to stepdown, intensivist consult due to persistent hypotension, nephrology and palliative care consult   Assessment & Plan:   Principal Problem:   Chronic HFrEF (heart failure with reduced ejection fraction) (HCC) Active Problems:   Atrial flutter (HCC)   Atrial flutter with RVR - On heparin  drip - Remains persistently hypotensive so oral amiodarone , metoprolol  on hold - Transferring to stepdown for close monitoring as he may not tolerate hypotension for long.  He does want aggressive care for now.  Consult intensivist - May need cardioversion -cardiology will decide on this - Started IV amiodarone  - May need Levophed   Left 3rd and 4th toe chronic ulcer History of PVD - The ulcer appears to be chronic, likely secondary to PVD as pedal pulses on the left significantly diminished compared to the right side, has been followed by wound care center with weekly dressing change.  Most recent dressing change was done 2 days ago, and he was told the wound appears to be stable and no signs of infection. - Conservative management, consult wound care. - Hold off antibiotics. - Seen by vascular surgery-plan to perform angiogram of the left lower extremity for possible potential revascularization on Monday   Chronic leukocytosis - As per oncology's note from earlier this month, chronic leukocytosis likely secondary to polycythemia vera, for which patient has been taking hydroxyurea . - Patient denied any cough, no urinary symptoms  or diarrhea and left fourth toe ulcer appears to be chronic, less likely has a active infection at this point.  Plan to monitor off antibiotics.   Decompensated combined non-anion gap metabolic acidosis and respiratory alkalosis  CKD stage IV Bladder cancer status post resection with chronic urostomy - Euvolemic, creatinine level stable - Nephrology seen   Syncope - Likely secondary to worsening of chronic HFrEF as well as moderate to severe aortic stenosis - Discussed with cardiology, who recommended conservative management. - He appears to be euvolemic at this point  CAD with left main stenosis seen on cardiac cath this admission Chronic systolic and diastolic heart failure with EF of 30 to 35% Moderate aortic stenosis May need transfer to [facility] For now continue heparin  and Plavix , statin   Polycythemia vera - Hemoglobin stable, continue hydroxyurea  - Patient follow-up with hematology.   Hypothyroidism - Continue Synthroid    Goals of care-palliative care consult.  His overall prognosis is poor   DVT prophylaxis: Heparin  drip      Code Status: (Full code Family Communication: None at bedside Disposition Plan: Possible discharge in 3 to 4 days depending on clinical condition, cardiology, nephrology and vascular surgery evaluation   Consultants:  Nephrology Vascular surgery Cardiology Intensivist Palliative care     Subjective:  Complains of foot pain.  Intermittent chest pain/pressure  Objective: Vitals:   03/24/24 0955 03/24/24 1059 03/24/24 1212 03/24/24 1341  BP: (!) 77/54 (!) 96/55 (!) 84/68 (!) 112/54  Pulse: (!) 130 (!) 29 66   Resp:    19  Temp:   98.1 F (36.7 C) 98.4 F (36.9 C)  TempSrc:   Oral Oral  SpO2:  94% 92%   Weight:      Height:        Intake/Output Summary (Last 24 hours) at 03/24/2024 1420 Last data filed at 03/24/2024 1115 Gross per 24 hour  Intake 868.33 ml  Output 1740 ml  Net -871.67 ml   Filed Weights   03/23/24  0909  Weight: 65.7 kg    Examination:  General exam: Appears calm and comfortable, elderly and chronically ill/cachectic looking Respiratory system: Clear to auscultation. Respiratory effort normal. Cardiovascular system: Irregularly irregular heart sounds Gastrointestinal system: Abdomen is soft, benign Central nervous system: Alert and oriented. No focal neurological deficits. Extremities: Symmetric 5 x 5 power. Skin: Left foot in the dressing for small superficial ulceration between 3rd and 4th toe Psychiatry: Judgement and insight appear normal. Mood & affect appropriate.     Data Reviewed: I have personally reviewed following labs and imaging studies  CBC: Recent Labs  Lab 03/21/24 1001 03/23/24 1335 03/23/24 1338 03/23/24 1654 03/24/24 0346  WBC 26.1*  --   --  23.3* 24.5*  NEUTROABS  --   --   --  19.1*  --   HGB 13.4 16.0 16.0 16.0 13.6  HCT 44.9 47.0 47.0 52.3* 45.9  MCV 85  --   --  83.7 85.2  PLT 592*  --   --  579* 556*   Basic Metabolic Panel: Recent Labs  Lab 03/21/24 1001 03/23/24 1335 03/23/24 1338 03/24/24 0346  NA 141 143 142 140  K 4.9 4.4 4.5 4.5  CL 106  --   --  109  CO2 18*  --   --  20*  GLUCOSE 102*  --   --  99  BUN 52*  --   --  45*  CREATININE 2.43*  --   --  2.26*  CALCIUM  9.0  --   --  8.8*    Coagulation Profile: Recent Labs  Lab 03/23/24 1654  INR 1.2    Sepsis Labs: Recent Labs  Lab 03/23/24 1654  LATICACIDVEN 1.6    No results found for this or any previous visit (from the past 240 hours).       Radiology Studies: DG Chest 1 View Result Date: 03/24/2024 CLINICAL DATA:  Congestive heart failure. EXAM: CHEST  1 VIEW COMPARISON:  03/23/2024 FINDINGS: Stable enlarged cardiac silhouette and tortuous and calcified thoracic aorta. Moderate-sized hiatal hernia. Clear lungs with normal vascularity. Mild chronic interstitial prominence. Moderate thoracic spine degenerative changes and mild scoliosis. IMPRESSION: 1. No  acute abnormality. 2. Stable cardiomegaly. 3. Moderate-sized hiatal hernia. Electronically Signed   By: Elspeth Bathe M.D.   On: 03/24/2024 11:28   US  RENAL Result Date: 03/23/2024 EXAM: US  Retroperitoneum Complete, Renal. 03/23/2024 05:51:35 PM TECHNIQUE: Real-time ultrasonography of the retroperitoneum renal was performed. COMPARISON: None available CLINICAL HISTORY: AKI (acute kidney injury). FINDINGS: FINDINGS: RIGHT KIDNEY/URETER: Right kidney measures 10.4 x 5.3 x 4.8 cm (138 mL). Normal cortical echogenicity. Mild fullness of the right renal collecting system without frank hydronephrosis. No calculus. No mass. LEFT KIDNEY/URETER: Left kidney measures 7.0 x 4.2 x 3.5 cm (55 mL). Normal cortical echogenicity. Mild fullness of the left renal collecting system without frank hydronephrosis. No calculus. No mass. BLADDER: Status post cystectomy. IMPRESSION: 1. Mild fullness of the bilateral renal collecting systems without frank hydronephrosis. 2. Status post cystectomy. Electronically signed by: Pinkie Pebbles MD 03/23/2024 09:32 PM EST RP Workstation: [id number]   DG Chest 1 View Result Date: 03/23/2024  EXAM: 1 VIEW(S) XRAY OF THE CHEST 03/23/2024 03:34:00 PM COMPARISON: 12/09/2023 CLINICAL HISTORY: CHF (congestive heart failure) (HCC) FINDINGS: LUNGS AND PLEURA: No focal pulmonary opacity. No pleural effusion. No pneumothorax. HEART AND MEDIASTINUM: Cardiomegaly. Tortuous thoracic aorta. Atherosclerotic calcifications. BONES AND SOFT TISSUES: Scoliosis of thoracolumbar spine. IMPRESSION: 1. Cardiomegaly with tortuous thoracic aorta and atherosclerotic calcifications. 2. Scoliosis of the thoracolumbar spine. Electronically signed by: Franky Stanford MD 03/23/2024 09:16 PM EST RP Workstation: [id number]   CARDIAC CATHETERIZATION Result Date: 03/23/2024   Ost LM to Mid LM lesion is 70% stenosed.   Mid RCA to Dist RCA lesion is 35% stenosed.   LV end diastolic pressure is normal.   There is moderate aortic  valve stenosis. Conclusions: Severe left main coronary artery disease with 70% proximal stenosis.  Otherwise, mild-moderate coronary artery disease. Normal left and right heart filling pressures. Moderately reduced Fick cardiac output/index (CO 3.1 L/min, CI 1.8 L/min/m). Moderate aortic valve stenosis (peak-to-peak gradient 22 mmHg).  Recommendations: Admit for optimization of heart failure and further evaluation of suspected critical limb ischemia involving the left foot and worsening leukocytosis. May need to consider transfer to [facility] for consideration of LMCA stenting, though given the patient's comorbidities, palliative medical therapy will also need to be considered.  I do not think he is a candidate for CABG. Gentle postcatheterization hydration in the setting of low normal filling pressures and CKD stage IV. Lonni Hanson, MD [facility]       Scheduled Meds:  amiodarone   150 mg Intravenous Once   atorvastatin   80 mg Oral Daily   ciprofloxacin   2 drop Left Eye Q4H while awake   clopidogrel   75 mg Oral Daily   ferrous sulfate   325 mg Oral Daily   hydroxyurea   500 mg Oral Daily   levothyroxine   50 mcg Oral Q0600   sodium bicarbonate   1,300 mg Oral BID   sodium chloride  flush  3 mL Intravenous Q12H   Continuous Infusions:  sodium chloride  40 mL/hr at 03/24/24 1420   amiodarone      Followed by   amiodarone      heparin  1,000 Units/hr (03/24/24 1208)   norepinephrine  (LEVOPHED ) Adult infusion 2 mcg/min (03/24/24 1420)     LOS: 0 days    Time spent (critical care): 35 minutes   He remains critically sick and is at high risk for acute cardiopulmonary failure, multiorgan failure and death    Cresencio Fairly, MD [facility] Pager [phone]  If 7PM-7AM, please contact night-coverage [email or web address] Password [email or web address] 03/24/2024, 2:20 PM

## 2024-03-24 NOTE — Progress Notes (Addendum)
 PHARMACY - ANTICOAGULATION CONSULT NOTE  Pharmacy Consult for Heparin  Indication: chest pain/ACS  Allergies[1]  Patient Measurements: Height: 5' 7 (170.2 cm) Weight: 64.1 kg (141 lb 5 oz) IBW/kg (Calculated) : 66.1 HEPARIN  DW (KG): 64.1  Labs: Recent Labs    03/23/24 1338 03/23/24 1654 03/24/24 0346 03/24/24 0815 03/24/24 2001  HGB 16.0 16.0 13.6  --   --   HCT 47.0 52.3* 45.9  --   --   PLT  --  579* 556*  --   --   APTT  --  45*  --   --   --   LABPROT  --  15.6*  --   --   --   INR  --  1.2  --   --   --   HEPARINUNFRC  --   --   --  <0.10* <0.10*  CREATININE  --   --  2.26*  --   --     Estimated Creatinine Clearance: 21.7 mL/min (A) (by C-G formula based on SCr of 2.26 mg/dL (H)).   Medical History: Past Medical History:  Diagnosis Date   Acute deep vein thrombosis (DVT) of right femoral vein (HCC) 10/28/2020   US  10/24/2020 [facility] Acute right lower extremity DVT extending from the common femoral vein to  the popliteal vein.     Bladder cancer (HCC)    Genital herpes    Hyperglycemia    Hypertension    Squamous cell carcinoma     Medications:  Medications Prior to Admission  Medication Sig Dispense Refill Last Dose/Taking   amLODipine  (NORVASC ) 2.5 MG tablet Take 2.5 mg by mouth daily.   Past Week Morning   aspirin  EC 81 MG tablet Take 81 mg by mouth daily. Swallow whole.   Past Week Morning   budesonide -formoterol  (SYMBICORT ) 160-4.5 MCG/ACT inhaler Inhale 2 puffs into the lungs 2 (two) times daily.   Past Week   clopidogrel  (PLAVIX ) 75 MG tablet Take 75 mg by mouth daily.   03/20/2024   furosemide  (LASIX ) 20 MG tablet Take 2 tablets (40 mg total) by mouth daily. (Patient taking differently: Take 20 mg by mouth daily.)   03/20/2024   hydroxyurea  (HYDREA ) 500 MG capsule Take 500 mg by mouth daily.   03/20/2024   levothyroxine  (SYNTHROID ) 50 MCG tablet Take 1 tablet (50 mcg total) by mouth daily. PLEASE NOTE CHANGE TO AND CANCEL ANY REMAINING REFILLS OF THE  25s (Patient taking differently: Take 75 mcg by mouth daily.) 90 tablet 0 03/20/2024   Multiple Vitamins-Minerals (MULTIVITAMIN ADULT PO) Take 1 tablet by mouth daily.   03/20/2024   oxyCODONE -acetaminophen  (PERCOCET) 10-325 MG tablet Take 1 tablet by mouth every 6 (six) hours as needed. for pain 120 tablet 0 03/20/2024   amiodarone  (PACERONE ) 200 MG tablet Take 1 tablet (200 mg total) by mouth daily. 30 tablet 0 03/20/2024   apixaban  (ELIQUIS ) 2.5 MG TABS tablet Take 1 tablet (2.5 mg total) by mouth 2 (two) times daily. (Patient taking differently: Take 5 mg by mouth 2 (two) times daily.) 60 tablet 0 03/20/2024   apixaban  (ELIQUIS ) 5 MG TABS tablet Take 5 mg by mouth daily.      ferrous sulfate  325 (65 FE) MG tablet Take 325 mg by mouth daily.   03/20/2024   metoprolol  succinate (TOPROL -XL) 50 MG 24 hr tablet Take 50 mg by mouth.   03/20/2024   valACYclovir  (VALTREX ) 1000 MG tablet TAKE 1 TABLET BY MOUTH TWICE A DAY AS NEEDED 15 tablet 4  Not Taking   Assessment: Patient is a 83 yo M presenting for right/left heart catheterization ad coronary angiography. PMH significant for essential hypertension, recurrent vertigo, polycythemia vera, hypothyroidism, history of DVTs, atrial fibrillation and flutter, history of bladder cancer peripheral arterial disease, and UTI. Pharmacy is consulted for Heparin  dosing and monitoring s/p heart cath and angiography. Per cardiology MD, plan is to start Heparin  infusion 8 hours post sheath removal. No initial bolus needed per MD. Sheath removed at ~16:20.   Baseline CBC with normal Hgb and elevated platelets, INR 1.2, and aPTT 45s  Med rec states patient is taking Eliquis  5 mg daily. Dispense report shows Eliquis  2.5 mg BID last filled 12/20/23 for 30d supply.   Date Time HL Rate/Comment  1213  0815  < 0.1 subthera; 800 un/hr 1213 2001 <0.1  Subtherapeutic @ 1000 units/hr, no infusion interruptions seen   Goal of Therapy:  Heparin  level 0.3-0.7 units/ml Monitor platelets  by anticoagulation protocol: Yes   Plan:  --Heparin  level is subtherapeutic --Heparin  2000 unit IV bolus and increase heparin  infusion rate to 1150 units/hr --Re-check HL 8 hours from rate change --Daily CBC per protocol while on IV heparin   Annabella LOISE Banks, PharmD Clinical Pharmacist 03/24/2024 10:12 PM      [1]  Allergies Allergen Reactions   Losartan Potassium Swelling    angioedema Other reaction(s): Angioedema   Shellfish Allergy Swelling and Rash    Rash, itching, swelling   Currently eats SHELLFISH without symptoms   Shellfish Protein-Containing Drug Products Dermatitis and Swelling    Rash, itching, swelling    Rash, itching, swelling   Currently eats SHELLFISH without symptoms   Doxycycline  Other (See Comments)    Blisters on hand   Ultram  [Tramadol Hcl] Nausea And Vomiting   Celecoxib Itching, Swelling, Rash and Other (See Comments)    Cannot take when eating shellfish   Codeine Palpitations    Heart racing   Cyclobenzaprine Other (See Comments)    Musculoskeletal therapy agents cause Excessive sedation Musculoskeletal therapy agents cause excessive sedation   Tramadol Hcl Nausea And Vomiting

## 2024-03-24 NOTE — Progress Notes (Signed)
 PHARMACY - ANTICOAGULATION CONSULT NOTE  Pharmacy Consult for Heparin  Indication: chest pain/ACS  Allergies[1]  Patient Measurements: Height: 5' 7 (170.2 cm) Weight: 65.7 kg (144 lb 14.4 oz) IBW/kg (Calculated) : 66.1 HEPARIN  DW (KG): 65.7  Labs: Recent Labs    03/23/24 1338 03/23/24 1654 03/24/24 0346  HGB 16.0 16.0 13.6  HCT 47.0 52.3* 45.9  PLT  --  579* 556*  APTT  --  45*  --   LABPROT  --  15.6*  --   INR  --  1.2  --   CREATININE  --   --  2.26*    Estimated Creatinine Clearance: 22.2 mL/min (A) (by C-G formula based on SCr of 2.26 mg/dL (H)).   Medical History: Past Medical History:  Diagnosis Date   Acute deep vein thrombosis (DVT) of right femoral vein (HCC) 10/28/2020   US  10/24/2020 [facility] Acute right lower extremity DVT extending from the common femoral vein to  the popliteal vein.     Bladder cancer (HCC)    Genital herpes    Hyperglycemia    Hypertension    Squamous cell carcinoma     Medications:  Medications Prior to Admission  Medication Sig Dispense Refill Last Dose/Taking   clopidogrel  (PLAVIX ) 75 MG tablet Take 75 mg by mouth daily.   03/20/2024   furosemide  (LASIX ) 20 MG tablet Take 2 tablets (40 mg total) by mouth daily. (Patient taking differently: Take 20 mg by mouth daily.)   03/20/2024   hydroxyurea  (HYDREA ) 500 MG capsule Take 500 mg by mouth daily.   03/20/2024   levothyroxine  (SYNTHROID ) 50 MCG tablet Take 1 tablet (50 mcg total) by mouth daily. PLEASE NOTE CHANGE TO AND CANCEL ANY REMAINING REFILLS OF THE 25s 90 tablet 0 03/20/2024   Multiple Vitamins-Minerals (MULTIVITAMIN ADULT PO) Take 1 tablet by mouth daily.   03/20/2024   oxyCODONE -acetaminophen  (PERCOCET) 10-325 MG tablet Take 1 tablet by mouth every 6 (six) hours as needed. for pain 120 tablet 0 03/20/2024   amiodarone  (PACERONE ) 200 MG tablet Take 1 tablet (200 mg total) by mouth daily. 30 tablet 0 03/20/2024   apixaban  (ELIQUIS ) 2.5 MG TABS tablet Take 1 tablet (2.5 mg total)  by mouth 2 (two) times daily. 60 tablet 0 03/20/2024   apixaban  (ELIQUIS ) 5 MG TABS tablet Take 5 mg by mouth daily.      ferrous sulfate  325 (65 FE) MG tablet Take 325 mg by mouth daily.   03/20/2024   metoprolol  succinate (TOPROL -XL) 50 MG 24 hr tablet Take 50 mg by mouth.   03/20/2024   sodium bicarbonate  650 MG tablet Take 1 tablet (650 mg total) by mouth daily. 60 tablet 1 03/20/2024   valACYclovir  (VALTREX ) 1000 MG tablet TAKE 1 TABLET BY MOUTH TWICE A DAY AS NEEDED 15 tablet 4 Not Taking   Assessment: Patient is a 83 yo M presenting for right/left heart catheterization ad coronary angiography. PMH significant for essential hypertension, recurrent vertigo, polycythemia vera, hypothyroidism, history of DVTs, atrial fibrillation and flutter, history of bladder cancer peripheral arterial disease, and UTI. Pharmacy is consulted for Heparin  dosing and monitoring s/p heart cath and angiography. Per cardiology MD, plan is to start Heparin  infusion 8 hours post sheath removal. No initial bolus needed per MD. Sheath removed at ~16:20.   Baseline CBC with normal Hgb and elevated platelets, INR 1.2, and aPTT 45s  Med rec states patient is taking Eliquis  5 mg daily. Dispense report shows Eliquis  2.5 mg BID last filled 12/20/23 for  30d supply.   1213 0815 HL < 0.1, subthera; 800 un/hr  Goal of Therapy:  Heparin  level 0.3-0.7 units/ml Monitor platelets by anticoagulation protocol: Yes   Plan:  --Heparin  level is subtherapeutic --Heparin  2000 unit IV bolus and increase heparin  infusion rate to 1000 units/hr --Re-check HL 8 hours from rate change --Daily CBC per protocol while on IV heparin   Clarke Peretz B Dara Camargo 03/24/2024,11:36 AM    [1]  Allergies Allergen Reactions   Losartan Potassium Swelling    angioedema Other reaction(s): Angioedema   Shellfish Allergy Swelling and Rash    Rash, itching, swelling   Currently eats SHELLFISH without symptoms   Shellfish Protein-Containing Drug Products  Dermatitis and Swelling    Rash, itching, swelling    Rash, itching, swelling   Currently eats SHELLFISH without symptoms   Doxycycline  Other (See Comments)    Blisters on hand   Ultram  [Tramadol Hcl] Nausea And Vomiting   Celecoxib Itching, Swelling, Rash and Other (See Comments)    Cannot take when eating shellfish   Codeine Palpitations    Heart racing   Cyclobenzaprine Other (See Comments)    Musculoskeletal therapy agents cause Excessive sedation Musculoskeletal therapy agents cause excessive sedation   Tramadol Hcl Nausea And Vomiting

## 2024-03-24 NOTE — Care Management Obs Status (Signed)
 MEDICARE OBSERVATION STATUS NOTIFICATION   Patient Details  Name: Jerry Mcpherson MRN: [id number] Date of Birth: [birth year]   Medicare Observation Status Notification Given:  Yes    Delphine KANDICE Bring, RN 03/24/2024, 10:41 AM

## 2024-03-24 NOTE — Plan of Care (Signed)
  Problem: Education: Goal: Understanding of CV disease, CV risk reduction, and recovery process will improve Outcome: Progressing   Problem: Activity: Goal: Ability to return to baseline activity level will improve Outcome: Progressing   Problem: Cardiovascular: Goal: Ability to achieve and maintain adequate cardiovascular perfusion will improve Outcome: Progressing Goal: Vascular access site(s) Level 0-1 will be maintained Outcome: Progressing   Problem: Health Behavior/Discharge Planning: Goal: Ability to safely manage health-related needs after discharge will improve Outcome: Progressing   Problem: Education: Goal: Knowledge of General Education information will improve Description: Including pain rating scale, medication(s)/side effects and non-pharmacologic comfort measures Outcome: Progressing   Problem: Health Behavior/Discharge Planning: Goal: Ability to manage health-related needs will improve Outcome: Progressing   Problem: Clinical Measurements: Goal: Ability to maintain clinical measurements within normal limits will improve Outcome: Progressing Goal: Will remain free from infection Outcome: Progressing Goal: Diagnostic test results will improve Outcome: Progressing Goal: Respiratory complications will improve Outcome: Progressing Goal: Cardiovascular complication will be avoided Outcome: Progressing   Problem: Activity: Goal: Risk for activity intolerance will decrease Outcome: Progressing   Problem: Nutrition: Goal: Adequate nutrition will be maintained Outcome: Progressing   Problem: Coping: Goal: Level of anxiety will decrease Outcome: Progressing   Problem: Elimination: Goal: Will not experience complications related to bowel motility Outcome: Progressing Goal: Will not experience complications related to urinary retention Outcome: Progressing   Problem: Pain Managment: Goal: General experience of comfort will improve and/or be  controlled Outcome: Progressing   Problem: Safety: Goal: Ability to remain free from injury will improve Outcome: Progressing   Problem: Skin Integrity: Goal: Risk for impaired skin integrity will decrease Outcome: Progressing

## 2024-03-24 NOTE — Consult Note (Signed)
 Cardiology Consultation:   Patient ID: [facility]; [id number]; [birth year]   Admit date: 03/23/2024 Date of Consult: 03/24/2024  Primary Care Provider: [name], MD Primary Cardiologist: [facility] Primary Electrophysiologist:  None   Patient Profile:   [facility]erry [facility] is a 83 y.o. 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 Maree.  History of Present Illness:   [facility] was previously followed by [facility] and [facility], establishing care with [facility] in 02/2024.  Stress echo in 2022 without evidence of cardiomyopathy or wall motion abnormality with mild aortic stenosis and trivial aortic and mitral regurgitation.  Echo in 10/2020 showed normal LV systolic function with mild LVH, normal RV systolic function trivial mitral and aortic regurgitation, and mild aortic stenosis.  Echo in 07/2022 showed an EF of 45% with lateral and posterior wall hypokinesis, mild LVH, normal RV systolic function, mild aortic insufficiency and mitral regurgitation, and mild aortic stenosis.  Echo in 04/2023 showed an EF greater than 55%, normal RV systolic function, mild aortic insufficiency and mitral regurgitation, and mild aortic stenosis with calcified aortic valve with partial fusion and restricted mobility of RCC and LCC.  Echo in 05/2023  showed an EF of 60 to 65% normal RV systolic function and ventricular cavity size, and moderate paradoxical low-flow low gradient aortic valve stenosis.   He was admitted to the [facility] in 12/2023 with multiple episodes of syncope associated with tachypalpitations and lightheadedness.  MRI of the brain showed no evidence of acute intracranial abnormality with remote lacunar infarcts and prior left thalamic hemorrhage.  He was evaluated by outside cardiology group.  Echo during the admission showed a new cardiomyopathy with an EF of 35 to 40%, global hypokinesis, severely dilated LV internal cavity size, mild concentric LVH, grade 3 diastolic dysfunction, moderately reduced RV systolic function with moderate to large ventricular cavity size, severe biatrial enlargement, mild to moderate mitral regurgitation, trivial aortic insufficiency, and moderate to severe aortic stenosis.  During the admission he was having multiple episodes of A-fib/flutter with RVR with recommendation to be maintained on apixaban , metoprolol , and was loaded with amiodarone .  Outpatient follow-up was recommended for syncope, aortic stenosis, and cardiomyopathy.   Echo in 01/2024 showed an EF of 30 to 35%, mild concentric LVH, grade 1 diastolic dysfunction, normal RV systolic function and ventricular cavity size, mild to moderate mitral regurgitation, severe calcification of the aortic valve with trivial insufficiency and moderate low-flow, low gradient stenosis.  Findings were similar to echo 1 month prior.   He established care with [facility] in 02/2024, at which time he reported shortness of breath and dizziness.  BP was soft at 92/50.  He was in sinus rhythm.  He has not been maintained on ACE inhibitor/ARB/ARNI secondary to documented angioedema with ARB.  He was continued on Farxiga  and furosemide  with recommendation to pursue cardiac cath to further evaluate cardiomyopathy and aortic stenosis.  He presented to [facility] on [facility]12  for planned outpatient diagnostic cath which demonstrated severe left main stenosis with 70% proximal disease with otherwise mild to moderate nonobstructive CAD as outlined below.  Normal left and right heart filling pressures, moderately reduced cardiac output/index, and moderate aortic stenosis.  It was recommended the patient be admitted and optimized for heart failure and further evaluation of suspected critical limb ischemia involving the left foot with progressive leukocytosis.    Post cath, he was evaluated by vascular surgery with plans for possible angiogram of the left lower extremity, pending renal function trend.  Post cath labs show stable serum creatinine at 2.26, progressive leukocytosis with a white blood cell count of 24.5 (history of polycythemia vera), and stable hemoglobin of 13.6.  He has remained hemodynamically stable post cath.  This morning, around 2:30 AM he developed a narrow complex tachycardia concerning for atrial flutter versus atrial tachycardia with aberrancy lasting for approximately 30 minutes with spontaneous conversion to sinus rhythm around 3 AM.  Around 8:45 AM this morning he developed atrial flutter with RVR with ventricular rates in the 130s bpm and remains in atrial flutter with RVR at this time.  He is without symptoms of chest pain, dyspnea, or palpitations.  No dizziness, presyncope, or syncope.    Past Medical History:  Diagnosis Date   Acute deep vein thrombosis (DVT) of right femoral vein (HCC) 10/28/2020   US  10/24/2020 [facility] Acute right lower extremity DVT extending from the common femoral vein to  the popliteal vein.     Bladder cancer (HCC)    Genital herpes    Hyperglycemia    Hypertension    Squamous cell carcinoma     Past Surgical History:  Procedure Laterality Date   CATARACT EXTRACTION Left 10/2012   [facility]; [facility]   CYSTECTOMY W/ URETEROILEAL CONDUIT  07/24/2020   [facility] for Stage IV bladder cancer extending to  prostate   EYE SURGERY Left    removed a piece of steel   FEMORAL ARTERY STENT Left 06/16/2022   SFA stenting at [facility]   HERNIA REPAIR  12/08/2006   umbilical hernia, incarderated; Dr. Dessa   PROSTATECTOMY  4/14/2-22   [facility] for Stage IV bladder cancer extending to prostate   ROTATOR CUFF REPAIR     SQUAMOUS CELL CARCINOMA EXCISION  2008   TONSILLECTOMY       Home Meds: Prior to Admission medications  Medication Sig Start Date End Date Taking? Authorizing Provider  clopidogrel  (PLAVIX ) 75 MG tablet Take 75 mg by mouth daily.   Yes [provider]  furosemide  (LASIX ) 20 MG tablet Take 2 tablets (40 mg total) by mouth daily. Patient taking differently: Take 20 mg by mouth daily. 03/05/24  Yes [facility] Rogue, MD  hydroxyurea  (HYDREA ) 500 MG capsule Take 500 mg by mouth daily.   Yes [provider]  levothyroxine  (SYNTHROID ) 50 MCG tablet Take 1 tablet (50 mcg total) by mouth daily. PLEASE NOTE CHANGE TO AND CANCEL ANY REMAINING REFILLS OF THE 25s 02/02/24  Yes Fisher, Nancyann BRAVO, MD  Multiple Vitamins-Minerals (MULTIVITAMIN ADULT PO) Take 1 tablet by mouth daily.   Yes [provider]  oxyCODONE -acetaminophen  (PERCOCET) 10-325 MG tablet Take 1 tablet by mouth every 6 (six) hours as needed. for pain 03/01/24  Yes Jerry Nancyann BRAVO, MD  amiodarone  (PACERONE ) 200 MG tablet Take 1 tablet (200 mg total) by mouth daily. 03/12/24   Jerry Nancyann BRAVO, MD  apixaban  (ELIQUIS ) 2.5 MG TABS  tablet Take 1 tablet (2.5 mg total) by mouth 2 (two) times daily. 12/20/23   Alexander, Natalie, DO  apixaban  (ELIQUIS ) 5 MG TABS tablet Take 5 mg by mouth daily.    [provider]  ferrous sulfate  325 (65 FE) MG tablet Take 325 mg by mouth daily.    [provider]  metoprolol  succinate (TOPROL -XL) 50 MG 24 hr tablet Take 50 mg by mouth. 01/01/24   [provider]  sodium bicarbonate  650 MG tablet Take 1 tablet (650 mg total) by mouth daily. 03/14/24   Jerry Nancyann BRAVO, MD  valACYclovir  (VALTREX ) 1000 MG tablet TAKE 1 TABLET BY MOUTH TWICE A DAY AS NEEDED 10/02/23   Jerry Nancyann BRAVO, MD    Inpatient Medications: Scheduled Meds:  amiodarone   150 mg Intravenous Once   atorvastatin   80 mg Oral Daily   ciprofloxacin   2 drop Left Eye Q4H while awake   clopidogrel   75 mg Oral Daily   ferrous sulfate   325 mg Oral Daily   hydroxyurea   500 mg Oral Daily   levothyroxine   50 mcg Oral Q0600   metoprolol  succinate  12.5 mg Oral Daily   sodium bicarbonate   1,300 mg Oral BID   sodium chloride  flush  3 mL Intravenous Q12H   Continuous Infusions:  sodium chloride      amiodarone      Followed by   amiodarone      heparin  800 Units/hr (03/24/24 0001)   PRN Meds: sodium chloride , oxyCODONE  **AND** acetaminophen , acetaminophen , ondansetron  (ZOFRAN ) IV, sodium chloride  flush  Allergies:  Allergies[1]  Social History:   Social History   Socioeconomic History   Marital status: Divorced    Spouse name: Not on file   Number of children: 1   Years of education: Not on file   Highest education level: Some college, no degree  Occupational History   Occupation: Retired   Occupation: sales executive part time  Tobacco Use   Smoking status: Former    Current packs/day: 0.00    Average packs/day: 3.0 packs/day for 15.0 years (45.0 ttl pk-yrs)    Types: Cigarettes    Start date: 04/12/1953    Quit date: 04/12/1968    Years since quitting: 55.9   Smokeless tobacco: Never  Vaping Use   Vaping status: Never Used  Substance and Sexual Activity   Alcohol use: No    Alcohol/week: 0.0 standard drinks of alcohol   Drug use: No   Sexual activity: Not on file  Other Topics Concern   Not on file  Social History Narrative   Not on file   Social Drivers of Health   Tobacco Use: Medium Risk (03/23/2024)   Patient History    Smoking Tobacco Use: Former    Smokeless Tobacco Use: Never    Passive Exposure: Not on file  Financial Resource Strain: Low Risk   (09/16/2023)   Received from [facility]   Overall Financial Resource Strain (CARDIA)    Difficulty of Paying Living Expenses: Not hard at all  Recent Concern: Financial Resource Strain - Medium Risk (08/10/2023)   Overall Financial Resource Strain (CARDIA)    Difficulty of Paying Living Expenses: Somewhat hard  Food Insecurity: Food Insecurity Present (03/23/2024)   Epic    Worried About Programme Researcher, Broadcasting/film/video in the Last Year: Sometimes true    Ran Out of Food in the Last Year: Sometimes true  Transportation Needs: Unmet Transportation Needs (03/23/2024)   Epic    Lack of Transportation (Medical): Yes  Lack of Transportation (Non-Medical): Yes  Physical Activity: Inactive (08/10/2023)   Exercise Vital Sign    Days of Exercise per Week: 0 days    Minutes of Exercise per Session: 0 min  Stress: No Stress Concern Present (08/10/2023)   [employer] of Occupational Health - Occupational Stress Questionnaire    Feeling of Stress : Only a little  Social Connections: Socially Isolated (03/23/2024)   Social Connection and Isolation Panel    Frequency of Communication with Friends and Family: More than three times a week    Frequency of Social Gatherings with Friends and Family: More than three times a week    Attends Religious Services: Never    Database Administrator or Organizations: No    Attends Banker Meetings: Never    Marital Status: Divorced  Catering Manager Violence: Not At Risk (03/23/2024)   Epic    Fear of Current or Ex-Partner: No    Emotionally Abused: No    Physically Abused: No    Sexually Abused: No  Depression (PHQ2-9): Low Risk (01/17/2024)   Depression (PHQ2-9)    PHQ-2 Score: 0  Alcohol Screen: Low Risk (08/10/2023)   Alcohol Screen    Last Alcohol Screening Score (AUDIT): 0  Housing: Low Risk (03/23/2024)   Epic    Unable to Pay for Housing in the Last Year: No    Number of Times Moved in the Last Year: 0    Homeless in the  Last Year: No  Utilities: Not At Risk (03/23/2024)   Epic    Threatened with loss of utilities: No  Health Literacy: Adequate Health Literacy (08/10/2023)   B1300 Health Literacy    Frequency of need for help with medical instructions: Never     Family History:   Family History  Problem Relation Age of Onset   Hypertension Mother    Heart attack Mother    Stroke Father    Cancer Father    COPD Sister    Congestive Heart Failure Brother     ROS:  Review of Systems  Constitutional:  Positive for malaise/fatigue. Negative for chills, diaphoresis, fever and weight loss.  HENT:  Negative for congestion.   Eyes:  Negative for discharge and redness.  Respiratory:  Positive for shortness of breath. Negative for cough, sputum production and wheezing.        Stable dyspnea over the past 3 to 4 months.  Cardiovascular:  Negative for chest pain, palpitations, orthopnea, claudication, leg swelling and PND.  Gastrointestinal:  Negative for abdominal pain, blood in stool, heartburn, melena, nausea and vomiting.  Musculoskeletal:  Negative for falls and myalgias.  Skin:  Negative for rash.  Neurological:  Positive for weakness. Negative for dizziness, tingling, tremors, sensory change, speech change, focal weakness and loss of consciousness.  Endo/Heme/Allergies:  Does not bruise/bleed easily.  Psychiatric/Behavioral:  Negative for substance abuse. The patient is not nervous/anxious.   All other systems reviewed and are negative.     Physical Exam/Data:   Vitals:   03/23/24 1658 03/23/24 1949 03/23/24 2256 03/24/24 0348  BP: (!) 148/80 (!) 156/86 (!) 148/87 105/70  Pulse: 72 75 81 79  Resp: 18 20 18 18   Temp: 97.6 F (36.4 C) 97.7 F (36.5 C) 97.9 F (36.6 C) 98.1 F (36.7 C)  TempSrc:      SpO2: 100% 100% (!) 73% 98%  Weight:      Height:        Intake/Output Summary (Last 24 hours) at  03/24/2024 0930 Last data filed at 03/24/2024 0730 Gross per 24 hour  Intake 628.33 ml   Output 1895 ml  Net -1266.67 ml   Filed Weights   03/23/24 0909  Weight: 65.7 kg   Body mass index is 22.69 kg/m.   Physical Exam: General: Elderly and chronically ill-appearing, in no acute distress. Head: Normocephalic, atraumatic, sclera non-icteric, no xanthomas, nares without discharge.  Neck: Negative for carotid bruits. JVD not elevated. Lungs: Clear bilaterally to auscultation without wheezes, rales, or rhonchi. Breathing is unlabored. Heart: Tachycardic with S1 S2. No murmurs, rubs, or gallops appreciated.  Right radial arteriotomy site without active bleeding, bruising, swelling, warmth, erythema, or tenderness to palpation.  Radial pulse 2+ proximal and distal to the arteriotomy site. Abdomen: Soft, non-tender, non-distended with normoactive bowel sounds. No hepatomegaly. No rebound/guarding. No obvious abdominal masses. Msk:  Strength and tone appear normal for age. Extremities: No clubbing or cyanosis. No edema.  Right foot bandaged. Neuro: Alert and oriented X 3. No facial asymmetry. No focal deficit. Moves all extremities spontaneously. Psych:  Responds to questions appropriately with a normal affect.   EKG:  The EKG was personally reviewed and demonstrates: NSR, 77 bpm, right axis deviation, possible RVH, nonspecific ST-T changes with lateral T wave inversion Telemetry:  Telemetry was personally reviewed and demonstrates: SR with episode of atrial flutter vs atrial tach with aberrancy around 0230 lasting until around 0300 with spontaneous conversion to sinus rhythm. Development of atrial flutter with RVR around 0845 with rates in the 130s bpm that persists   Weights: Filed Weights   03/23/24 0909  Weight: 65.7 kg    Relevant CV Studies:  R/LHC 03/23/2024:   Ost LM to Mid LM lesion is 70% stenosed.   Mid RCA to Dist RCA lesion is 35% stenosed.   LV end diastolic pressure is normal.   There is moderate aortic valve stenosis.   Conclusions: Severe left main  coronary artery disease with 70% proximal stenosis.  Otherwise, mild-moderate coronary artery disease. Normal left and right heart filling pressures. Moderately reduced Fick cardiac output/index (CO 3.1 L/min, CI 1.8 L/min/m). Moderate aortic valve stenosis (peak-to-peak gradient 22 mmHg).   Recommendations: Admit for optimization of heart failure and further evaluation of suspected critical limb ischemia involving the left foot and worsening leukocytosis. May need to consider transfer to [facility] for consideration of LMCA stenting, though given the patient's comorbidities, palliative medical therapy will also need to be considered.  I do not think he is a candidate for CABG. Gentle postcatheterization hydration in the setting of low normal filling pressures and CKD stage IV. __________  2D echo 02/03/2024: 1. Left ventricular ejection fraction, by estimation, is 30 to 35%. The  left ventricle has moderately decreased function. Left ventricular  endocardial border not optimally defined to evaluate regional wall motion.  There is mild concentric left  ventricular hypertrophy. Left ventricular diastolic parameters are  consistent with Grade I diastolic dysfunction (impaired relaxation). The  average left ventricular global longitudinal strain is -8.4 %. The global  longitudinal strain is abnormal.   2. Right ventricular systolic function is normal. The right ventricular  size is normal.   3. Mild to moderate mitral valve regurgitation. No evidence of mitral  stenosis.   4. There is severe calcifcation of the aortic valve. Aortic valve  regurgitation is trivial. Moderate low-flow, low-gradient stenosis. Aortic  valve mean gradient measures 11.0 mmHg. Aortic valve Vmax measures 2.29  m/s. AVA 1.49, DVI 0.36.   Comparison(s): A  prior study was performed on 12/17/2023. No significant  change from prior study.  __________  2D echo 12/17/2023: 1. Left ventricular ejection fraction, by  estimation, is 35 to 40%. The  left ventricle has moderate to severely decreased function. The left  ventricle demonstrates global hypokinesis. The left ventricular internal  cavity size was severely dilated. There   is mild concentric left ventricular hypertrophy. Left ventricular  diastolic parameters are consistent with Grade III diastolic dysfunction  (restrictive).   2. Right ventricular systolic function is moderately reduced. The right  ventricular size is moderately enlarged. Mildly increased right  ventricular wall thickness.   3. Left atrial size was severely dilated.   4. Right atrial size was severely dilated.   5. The mitral valve is abnormal. Mild to moderate mitral valve  regurgitation.   6. The aortic valve is calcified. Aortic valve regurgitation is trivial.  Moderate to severe aortic valve stenosis.   Conclusion(s)/Recommendation(s): Findings consistent with severe valvular  heart disease.  __________  2D echo 05/19/2023 [facility]): Summary   1. The left ventricle is normal in size with mildly increased wall  thickness.   2. The left ventricular systolic function is normal, LVEF is visually  estimated at 60-65%.    3. The mitral valve leaflets are moderately thickened with moderately  reduced leaflet mobility.    4. There is moderate paradoxical low flow low gradient aortic valve  stenosis.   5. The right ventricle is normal in size, with normal systolic function.  __________  2D echo 05/06/2023 ([facility]): NORMAL LEFT VENTRICULAR SYSTOLIC FUNCTION WITH NO LVH  ESTIMATED EF: >55%, CALC EF(2D): 53%, CALC EF(3D): 54%  INDETERMINATE DIASTOLIC FUNCTION  NORMAL RIGHT VENTRICULAR SYSTOLIC FUNCTION  VALVULAR REGURGITATION: MILD AR, MILD MR, No PR, MILD TR  VALVULAR STENOSIS: MILD AS, No MS, No PS, No TS  __________  2D echo 08/04/2022 ([facility]): MILD LV DYSFUNCTION (See above) WITH MILD LVH    NORMAL RIGHT VENTRICULAR SYSTOLIC FUNCTION    VALVULAR REGURGITATION: MILD AR, MILD  MR, TRIVIAL PR, TRIVIAL TR    VALVULAR STENOSIS: MILD AS    IRREGULAR RHYTHM    LIVER CYST  ___________  See Care Everywhere for more remote cardiac imaging (also outlined in HPI)   Laboratory Data:  Chemistry Recent Labs  Lab 03/21/24 1001 03/23/24 1335 03/23/24 1338 03/24/24 0346  NA 141 143 142 140  K 4.9 4.4 4.5 4.5  CL 106  --   --  109  CO2 18*  --   --  20*  GLUCOSE 102*  --   --  99  BUN 52*  --   --  45*  CREATININE 2.43*  --   --  2.26*  CALCIUM  9.0  --   --  8.8*  GFRNONAA  --   --   --  28*  ANIONGAP  --   --   --  11    No results for input(s): PROT, ALBUMIN, AST, ALT, ALKPHOS, BILITOT in the last 168 hours. Hematology Recent Labs  Lab 03/21/24 1001 03/23/24 1335 03/23/24 1338 03/23/24 1654 03/24/24 0346  WBC 26.1*  --   --  23.3* 24.5*  RBC 5.26  --   --  6.25* 5.39  HGB 13.4   < > 16.0 16.0 13.6  HCT 44.9   < > 47.0 52.3* 45.9  MCV 85  --   --  83.7 85.2  MCH 25.5*  --   --  25.6* 25.2*  MCHC 29.8*  --   --  30.6 29.6*  RDW 20.7*  --   --  24.0* 23.6*  PLT 592*  --   --  579* 556*   < > = values in this interval not displayed.   Cardiac EnzymesNo results for input(s): TROPONINI in the last 168 hours. No results for input(s): TROPIPOC in the last 168 hours.  BNPNo results for input(s): BNP, PROBNP in the last 168 hours.  DDimer No results for input(s): DDIMER in the last 168 hours.  Radiology/Studies:  US  RENAL Result Date: 03/23/2024 IMPRESSION: 1. Mild fullness of the bilateral renal collecting systems without frank hydronephrosis. 2. Status post cystectomy. Electronically signed by: Pinkie Pebbles MD 03/23/2024 09:32 PM EST RP Workstation: [id number]   DG Chest 1 View Result Date: 03/23/2024 IMPRESSION: 1. Cardiomegaly with tortuous thoracic aorta and atherosclerotic calcifications. 2. Scoliosis of the thoracolumbar spine. Electronically signed by: Franky Stanford MD 03/23/2024 09:16 PM EST RP Workstation: [id number]     Assessment and Plan:   1. CAD involving the native coronary arteries with severe proximal left main disease estimated at 70%:  - Without symptoms of angina with chronic dyspnea - Will need to consider transfer to [facility] for consideration of left main stenting following optimization of comorbidities - Not felt to be a good CABG candidate - Clopidogrel  75 mg - Start atorvastatin  80 mg - Post-cath instructions   2. HFrEF secondary to ICM:  - Euvolemic with normal left and right heart filling pressures on cardiac cath - PTA Toprol -XL with relative hypotension precluding escalation - Not on ACE inhibitor, ARB, ARNI due to angioedema with ARB  - Escalate GDMT as indicated - No indication for IV diuresis at this time  3. Aortic stenosis:  - Echo in 12/2023 for moderate to severe aortic valve stenosis with a mean gradient of 19.8 mmHg and a valve area 1.39 cm  - Moderate aortic stenosis noted on cardiac cath on 03/23/2024 with a peak to peak gradient of 22 mmHg  4. A-fib/flutter:  - Initially diagnosed during admission in 12/2020  - Has been maintained on Toprol -XL 50 mg and amiodarone  200 mg  - Surveillance labs show abnormal TSH, consider referral to EP in the outpatient setting to assist with antiarrhythmic therapy in the management of A-fib/flutter, will defer to primary cardiologist  - Developed atrial flutter with RVR this morning around 845 with rates in the 130s bpm - Stop oral amiodarone , start IV amiodarone  in an effort to pharmacologically cardiovert given the patient was in sinus rhythm with on 12/12 - With cardiomyopathy and aortic stenosis, patient will not tolerate persistent RVR - If there is difficulty controlling ventricular rates, would need to pursue cardioversion - Hemodynamically stable without indication for emergent cardioversion at this time - Toprol -XL for now, relative hypotension precludes escalation of beta-blockade, may need to transition to short acting  metoprolol , though given cardiomyopathy would prefer metoprolol  succinate - Monitor on telemetry - CHA2DS2-VASc at least 7 (CHF, HTN, age x 2, CVA x 2, vascular disease)  - Heparin  drip while admitted with recommendation to resume OAC prior to discharge  5. Acute on CKD stage IIIb:  - Renal function stable to mildly improved - Monitor while admitted - For invasive imaging, minimize contrast when possible  6. History of syncope:  - No further episodes - Monitor on telemetry - No driving for 6 months from date of syncopal episode or until identifiable cause has been adequately treated   7. HTN:  - Blood pressure currently well-controlled - Pharmacotherapy as outlined  above   8. HLD:  - No recent lipid panel  - LDL 68 from 2019  - Check lipid panel  - Has not been maintained on statin therapy historically  - Start atorvastatin  80 mg  9. Polycythemia vera:  - Follow-up with PCP and hematology   10. Hypothyroidism:  - Most recent TSH 10.8 with free T4 normal  - Will need close monitoring of thyroid  levels on amiodarone   - PTA levothyroxine  50 mcg  - Follow-up with PCP   11.  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   Signed, Bernardino Bring, PA-C  [facility] Pager: [phone] 03/24/2024, 9:30 AM       [1]  Allergies Allergen Reactions   Losartan Potassium Swelling    angioedema Other reaction(s): Angioedema   Shellfish Allergy Swelling and Rash    Rash, itching, swelling   Currently eats SHELLFISH without symptoms   Shellfish Protein-Containing Drug Products Dermatitis and Swelling    Rash, itching, swelling    Rash, itching, swelling    Currently eats SHELLFISH without symptoms   Doxycycline  Other (See Comments)    Blisters on hand   Ultram  [Tramadol Hcl] Nausea And Vomiting   Celecoxib Itching, Swelling, Rash and Other (See Comments)    Cannot take when eating shellfish   Codeine Palpitations    Heart racing   Cyclobenzaprine Other (See Comments)    Musculoskeletal therapy agents cause Excessive sedation Musculoskeletal therapy agents cause excessive sedation   Tramadol Hcl Nausea And Vomiting

## 2024-03-24 NOTE — Consult Note (Signed)
 [facility] CONSULT NOTE    Date: 03/24/2024                  Patient Name:  Jerry Mcpherson  MRN: [id number]  DOB: 03-Jul-1940  Age / Sex: 83 y.o., male         PCP: [name], MD                 Service Requesting Consult: Hospitalist                 Reason for Consult: Evaluation management of chronic kidney disease stage IV in light of recent contrast exposure            History of Present Illness: Patient is a 83 y.o. male with a PMHx of chronic systolic heart failure ejection fraction 30 to 35%, hypertension, hypothyroidism, DVT, paroxysmal atrial fibrillation on Eliquis , bladder cancer status post cystectomy with chronic urostomy, peripheral vascular disease, who was admitted to [facility] on 03/23/2024 for elective left heart catheterization.  This revealed severe left main coronary disease with 70% proximal stenosis otherwise mild to moderate coronary artery disease.  Cardiology recommended admission to the hospital for optimization of heart failure and further evaluation of suspected critical limb ischemia involving left foot and worsening leukocytosis.  We were asked to see him in light of known chronic kidney disease stage IV with baseline eGFR of 26 in November and contrast exposure.  Patient states that he has not yet established with nephrology as outpatient but was to see us  in the relative near future.  Creatinine today is 2.26 with an eGFR 28.  Vascular surgery has seen the patient.  They are planning for additional angiogram early next week after stabilization of renal function.   Medications: Outpatient medications: Medications Prior to Admission  Medication Sig Dispense Refill Last Dose/Taking   clopidogrel  (PLAVIX ) 75 MG tablet Take 75 mg by mouth daily.   03/20/2024   furosemide  (LASIX ) 20 MG tablet Take 2 tablets (40 mg total) by mouth daily. (Patient taking differently: Take 20 mg by mouth daily.)   03/20/2024   hydroxyurea  (HYDREA ) 500  MG capsule Take 500 mg by mouth daily.   03/20/2024   levothyroxine  (SYNTHROID ) 50 MCG tablet Take 1 tablet (50 mcg total) by mouth daily. PLEASE NOTE CHANGE TO AND CANCEL ANY REMAINING REFILLS OF THE 25s 90 tablet 0 03/20/2024   Multiple Vitamins-Minerals (MULTIVITAMIN ADULT PO) Take 1 tablet by mouth daily.   03/20/2024   oxyCODONE -acetaminophen  (PERCOCET) 10-325 MG tablet Take 1 tablet by mouth every 6 (six) hours as needed. for pain 120 tablet 0 03/20/2024   amiodarone  (PACERONE ) 200 MG tablet Take 1 tablet (200 mg total) by mouth daily. 30 tablet 0 03/20/2024   apixaban  (ELIQUIS ) 2.5 MG TABS tablet Take 1 tablet (2.5 mg total) by mouth 2 (two) times daily. 60 tablet 0 03/20/2024   apixaban  (ELIQUIS ) 5 MG TABS tablet Take 5 mg by mouth daily.      ferrous sulfate  325 (65 FE) MG tablet Take 325 mg by mouth daily.   03/20/2024   metoprolol  succinate (TOPROL -XL) 50 MG 24 hr tablet Take 50 mg by mouth.   03/20/2024   sodium bicarbonate  650 MG tablet Take 1 tablet (650 mg total) by mouth daily. 60 tablet 1 03/20/2024   valACYclovir  (VALTREX ) 1000 MG tablet TAKE 1 TABLET BY MOUTH TWICE A DAY AS NEEDED 15 tablet 4 Not Taking    Current medications: Current Facility-Administered Medications  Medication Dose Route Frequency Provider Last Rate Last Admin   0.9 %  sodium chloride  infusion  250 mL Intravenous PRN End, Lonni, MD       oxyCODONE  (Oxy IR/ROXICODONE ) immediate release tablet 10 mg  10 mg Oral Q6H PRN End, Lonni, MD   10 mg at 03/23/24 1611   And   acetaminophen  (TYLENOL ) tablet 325 mg  325 mg Oral Q6H PRN End, Lonni, MD   325 mg at 03/23/24 1611   acetaminophen  (TYLENOL ) tablet 650 mg  650 mg Oral Q4H PRN End, Lonni, MD       amiodarone  (NEXTERONE  PREMIX) 360-4.14 MG/200ML-% (1.8 mg/mL) IV infusion  60 mg/hr Intravenous Continuous Lonni Slain, MD       Followed by   amiodarone  (NEXTERONE  PREMIX) 360-4.14 MG/200ML-% (1.8 mg/mL) IV infusion  30 mg/hr  Intravenous Continuous Lonni Slain, MD       amiodarone  (NEXTERONE ) 1.8 mg/mL load via infusion 150 mg  150 mg Intravenous Once Lonni Slain, MD       atorvastatin  (LIPITOR ) tablet 80 mg  80 mg Oral Daily Dunn, Ryan M, PA-C   80 mg at 03/24/24 1007   ciprofloxacin  (CILOXAN ) 0.3 % ophthalmic solution 2 drop  2 drop Left Eye Q4H while awake Maree Hue, MD   2 drop at 03/24/24 1008   clopidogrel  (PLAVIX ) tablet 75 mg  75 mg Oral Daily End, Christopher, MD   75 mg at 03/24/24 1007   ferrous sulfate  tablet 325 mg  325 mg Oral Daily End, Christopher, MD   325 mg at 03/24/24 1007   heparin  ADULT infusion 100 units/mL (25000 units/250mL)  1,000 Units/hr Intravenous Continuous Clair Marolyn NOVAK, RPH 10 mL/hr at 03/24/24 1208 1,000 Units/hr at 03/24/24 1208   hydroxyurea  (HYDREA ) capsule 500 mg  500 mg Oral Daily End, Christopher, MD   500 mg at 03/24/24 1007   levothyroxine  (SYNTHROID ) tablet 50 mcg  50 mcg Oral Q0600 End, Lonni, MD   50 mcg at 03/24/24 9361   ondansetron  (ZOFRAN ) injection 4 mg  4 mg Intravenous Q6H PRN End, Christopher, MD   4 mg at 03/23/24 2356   sodium bicarbonate  tablet 1,300 mg  1,300 mg Oral BID Laurita Manor T, MD   1,300 mg at 03/24/24 1007   sodium chloride  flush (NS) 0.9 % injection 3 mL  3 mL Intravenous Q12H End, Christopher, MD   3 mL at 03/24/24 1013   sodium chloride  flush (NS) 0.9 % injection 3 mL  3 mL Intravenous PRN End, Lonni, MD          Allergies: Allergies[1]    Past Medical History: Past Medical History:  Diagnosis Date   Acute deep vein thrombosis (DVT) of right femoral vein (HCC) 10/28/2020   US  10/24/2020 [facility] Acute right lower extremity DVT extending from the common femoral vein to  the popliteal vein.     Bladder cancer (HCC)    Genital herpes    Hyperglycemia    Hypertension    Squamous cell carcinoma      Past Surgical History: Past Surgical History:  Procedure Laterality Date   CATARACT EXTRACTION Left 10/2012    Dr. Lethea; [facility]   CYSTECTOMY W/ URETEROILEAL CONDUIT  07/24/2020   [facility] for Stage IV bladder cancer extending to prostate   EYE SURGERY Left    removed a piece of steel   FEMORAL ARTERY STENT Left 06/16/2022   [facility] stenting at [facility]   HERNIA REPAIR  12/08/2006   umbilical hernia,  incarderated; Dr. Dessa   PROSTATECTOMY  4/14/2-22   [facility] for Stage IV bladder cancer extending to prostate   ROTATOR CUFF REPAIR     SQUAMOUS CELL CARCINOMA EXCISION  2008   TONSILLECTOMY       Family History: Family History  Problem Relation Age of Onset   Hypertension Mother    Heart attack Mother    Stroke Father    Cancer Father    COPD Sister    Congestive Heart Failure Brother      Social History: Social History   Socioeconomic History   Marital status: Divorced    Spouse name: Not on file   Number of children: 1   Years of education: Not on file   Highest education level: Some college, no degree  Occupational History   Occupation: Retired   Occupation: sales executive part time  Tobacco Use   Smoking status: Former    Current packs/day: 0.00    Average packs/day: 3.0 packs/day for 15.0 years (45.0 ttl pk-yrs)    Types: Cigarettes    Start date: 04/12/1953    Quit date: 04/12/1968    Years since quitting: 55.9   Smokeless tobacco: Never  Vaping Use   Vaping status: Never Used  Substance and Sexual Activity   Alcohol use: No    Alcohol/week: 0.0 standard drinks of alcohol   Drug use: No   Sexual activity: Not on file  Other Topics Concern   Not on file  Social History Narrative   Not on file   Social Drivers of Health   Tobacco Use: Medium Risk (03/23/2024)   Patient History    Smoking Tobacco Use: Former    Smokeless Tobacco Use: Never    Passive Exposure: Not on file  Financial Resource Strain: Low Risk  (09/16/2023)   Received from [facility]   Overall Financial Resource Strain (CARDIA)    Difficulty of Paying Living  Expenses: Not hard at all  Recent Concern: Financial Resource Strain - Medium Risk (08/10/2023)   Overall Financial Resource Strain (CARDIA)    Difficulty of Paying Living Expenses: Somewhat hard  Food Insecurity: Food Insecurity Present (03/23/2024)   Epic    Worried About Programme Researcher, Broadcasting/film/video in the Last Year: Sometimes true    Ran Out of Food in the Last Year: Sometimes true  Transportation Needs: Unmet Transportation Needs (03/23/2024)   Epic    Lack of Transportation (Medical): Yes    Lack of Transportation (Non-Medical): Yes  Physical Activity: Inactive (08/10/2023)   Exercise Vital Sign    Days of Exercise per Week: 0 days    Minutes of Exercise per Session: 0 min  Stress: No Stress Concern Present (08/10/2023)   [employer] of Occupational Health - Occupational Stress Questionnaire    Feeling of Stress : Only a little  Social Connections: Socially Isolated (03/23/2024)   Social Connection and Isolation Panel    Frequency of Communication with Friends and Family: More than three times a week    Frequency of Social Gatherings with Friends and Family: More than three times a week    Attends Religious Services: Never    Database Administrator or Organizations: No    Attends Banker Meetings: Never    Marital Status: Divorced  Catering Manager Violence: Not At Risk (03/23/2024)   Epic    Fear of Current or Ex-Partner: No    Emotionally Abused: No    Physically Abused: No    Sexually Abused:  No  Depression (PHQ2-9): Low Risk (01/17/2024)   Depression (PHQ2-9)    PHQ-2 Score: 0  Alcohol Screen: Low Risk (08/10/2023)   Alcohol Screen    Last Alcohol Screening Score (AUDIT): 0  Housing: Low Risk (03/23/2024)   Epic    Unable to Pay for Housing in the Last Year: No    Number of Times Moved in the Last Year: 0    Homeless in the Last Year: No  Utilities: Not At Risk (03/23/2024)   Epic    Threatened with loss of utilities: No  Health Literacy: Adequate Health  Literacy (08/10/2023)   B1300 Health Literacy    Frequency of need for help with medical instructions: Never     Review of Systems: Review of Systems  Constitutional:  Positive for malaise/fatigue. Negative for chills and fever.  HENT:  Negative for congestion, hearing loss and tinnitus.   Eyes:  Negative for blurred vision and double vision.  Respiratory:  Negative for cough, sputum production and shortness of breath.   Cardiovascular:  Negative for chest pain, palpitations and orthopnea.  Gastrointestinal:  Negative for diarrhea, nausea and vomiting.  Genitourinary:  Negative for dysuria, frequency and urgency.  Musculoskeletal:  Negative for myalgias.  Skin:  Negative for itching and rash.  Neurological:  Negative for dizziness and focal weakness.  Endo/Heme/Allergies:  Negative for polydipsia. Does not bruise/bleed easily.  Psychiatric/Behavioral:  The patient is not nervous/anxious.      Vital Signs: Blood pressure (!) 84/68, pulse 66, temperature 98.1 F (36.7 C), temperature source Oral, resp. rate 20, height 5' 7 (1.702 m), weight 65.7 kg, SpO2 92%.  Weight trends: Filed Weights   03/23/24 0909  Weight: 65.7 kg     Physical Exam: General: No acute distress  Head: Normocephalic, atraumatic. Moist oral mucosal membranes  Eyes: Anicteric  Neck: Supple  Lungs:  Clear to auscultation, normal effort  Heart: S1S2 no rubs  Abdomen:  Soft, nontender, bowel sounds present, urostomy present  Extremities: No lower extremity edema, left foot wrapped  Neurologic: Awake, alert, following commands  Skin: No acute rash  Access: No hemodialysis access    Lab results: Basic Metabolic Panel: Recent Labs  Lab 03/21/24 1001 03/23/24 1335 03/23/24 1338 03/24/24 0346  NA 141 143 142 140  K 4.9 4.4 4.5 4.5  CL 106  --   --  109  CO2 18*  --   --  20*  GLUCOSE 102*  --   --  99  BUN 52*  --   --  45*  CREATININE 2.43*  --   --  2.26*  CALCIUM  9.0  --   --  8.8*     Liver Function Tests: No results for input(s): AST, ALT, ALKPHOS, BILITOT, PROT, ALBUMIN in the last 168 hours. No results for input(s): LIPASE, AMYLASE in the last 168 hours. No results for input(s): AMMONIA in the last 168 hours.  CBC: Recent Labs  Lab 03/21/24 1001 03/23/24 1335 03/23/24 1338 03/23/24 1654 03/24/24 0346  WBC 26.1*  --   --  23.3* 24.5*  NEUTROABS  --   --   --  19.1*  --   HGB 13.4   < > 16.0 16.0 13.6  HCT 44.9   < > 47.0 52.3* 45.9  MCV 85  --   --  83.7 85.2  PLT 592*  --   --  579* 556*   < > = values in this interval not displayed.    Cardiac Enzymes: No  results for input(s): CKTOTAL, CKMB, CKMBINDEX, TROPONINI in the last 168 hours.  BNP: Invalid input(s): POCBNP  CBG: No results for input(s): GLUCAP in the last 168 hours.  Microbiology: Results for orders placed or performed during the hospital encounter of 12/16/23  Urine Culture     Status: Abnormal   Collection Time: 12/16/23  3:59 PM   Specimen: Urine, Suprapubic  Result Value Ref Range Status   Specimen Description   Final    URINE, SUPRAPUBIC Performed at [facility], [address] [place], [place] [address]    Special Requests   Final    NONE Performed at [facility], [address] [place], [place] [address]    Culture (A)  Final    50,000 COLONIES/mL KLEBSIELLA PNEUMONIAE >=100,000 COLONIES/mL CITROBACTER KOSERI    Report Status 12/18/2023 FINAL  Final   Organism ID, Bacteria KLEBSIELLA PNEUMONIAE (A)  Final   Organism ID, Bacteria CITROBACTER KOSERI (A)  Final      Susceptibility   Citrobacter koseri - MIC*    CEFEPIME <=0.12 SENSITIVE Sensitive     ERTAPENEM <=0.12 SENSITIVE Sensitive     CEFTRIAXONE  <=0.25 SENSITIVE Sensitive     CIPROFLOXACIN  <=0.06 SENSITIVE Sensitive     GENTAMICIN <=1 SENSITIVE Sensitive     NITROFURANTOIN 32 SENSITIVE Sensitive     TRIMETH/SULFA <=20 SENSITIVE Sensitive     PIP/TAZO  Value in next row Sensitive ug/mL     <=4 SENSITIVEThis is a modified FDA-approved test that has been validated and its performance characteristics determined by the reporting laboratory.  This laboratory is certified under the Clinical Laboratory Improvement Amendments CLIA as qualified to perform high complexity clinical laboratory testing.    MEROPENEM Value in next row Sensitive      <=4 SENSITIVEThis is a modified FDA-approved test that has been validated and its performance characteristics determined by the reporting laboratory.  This laboratory is certified under the Clinical Laboratory Improvement Amendments CLIA as qualified to perform high complexity clinical laboratory testing.    * >=100,000 COLONIES/mL CITROBACTER KOSERI   Klebsiella pneumoniae - MIC*    AMPICILLIN Value in next row Resistant      <=4 SENSITIVEThis is a modified FDA-approved test that has been validated and its performance characteristics determined by the reporting laboratory.  This laboratory is certified under the Clinical Laboratory Improvement Amendments CLIA as qualified to perform high complexity clinical laboratory testing.    CEFAZOLIN  (URINE) Value in next row Sensitive      2 SENSITIVEThis is a modified FDA-approved test that has been validated and its performance characteristics determined by the reporting laboratory.  This laboratory is certified under the Clinical Laboratory Improvement Amendments CLIA as qualified to perform high complexity clinical laboratory testing.    CEFEPIME Value in next row Sensitive      2 SENSITIVEThis is a modified FDA-approved test that has been validated and its performance characteristics determined by the reporting laboratory.  This laboratory is certified under the Clinical Laboratory Improvement Amendments CLIA as qualified to perform high complexity clinical laboratory testing.    ERTAPENEM Value in next row Sensitive      2 SENSITIVEThis is a modified FDA-approved test that  has been validated and its performance characteristics determined by the reporting laboratory.  This laboratory is certified under the Clinical Laboratory Improvement Amendments CLIA as qualified to perform high complexity clinical laboratory testing.    CEFTRIAXONE  Value in next row Sensitive      2 SENSITIVEThis is a modified  FDA-approved test that has been validated and its performance characteristics determined by the reporting laboratory.  This laboratory is certified under the Clinical Laboratory Improvement Amendments CLIA as qualified to perform high complexity clinical laboratory testing.    CIPROFLOXACIN  Value in next row Sensitive      2 SENSITIVEThis is a modified FDA-approved test that has been validated and its performance characteristics determined by the reporting laboratory.  This laboratory is certified under the Clinical Laboratory Improvement Amendments CLIA as qualified to perform high complexity clinical laboratory testing.    GENTAMICIN Value in next row Sensitive      2 SENSITIVEThis is a modified FDA-approved test that has been validated and its performance characteristics determined by the reporting laboratory.  This laboratory is certified under the Clinical Laboratory Improvement Amendments CLIA as qualified to perform high complexity clinical laboratory testing.    NITROFURANTOIN Value in next row Intermediate      2 SENSITIVEThis is a modified FDA-approved test that has been validated and its performance characteristics determined by the reporting laboratory.  This laboratory is certified under the Clinical Laboratory Improvement Amendments CLIA as qualified to perform high complexity clinical laboratory testing.    TRIMETH/SULFA Value in next row Sensitive      2 SENSITIVEThis is a modified FDA-approved test that has been validated and its performance characteristics determined by the reporting laboratory.  This laboratory is certified under the Clinical Laboratory  Improvement Amendments CLIA as qualified to perform high complexity clinical laboratory testing.    AMPICILLIN/SULBACTAM Value in next row Sensitive      2 SENSITIVEThis is a modified FDA-approved test that has been validated and its performance characteristics determined by the reporting laboratory.  This laboratory is certified under the Clinical Laboratory Improvement Amendments CLIA as qualified to perform high complexity clinical laboratory testing.    PIP/TAZO Value in next row Sensitive ug/mL     <=4 SENSITIVEThis is a modified FDA-approved test that has been validated and its performance characteristics determined by the reporting laboratory.  This laboratory is certified under the Clinical Laboratory Improvement Amendments CLIA as qualified to perform high complexity clinical laboratory testing.    MEROPENEM Value in next row Sensitive      <=4 SENSITIVEThis is a modified FDA-approved test that has been validated and its performance characteristics determined by the reporting laboratory.  This laboratory is certified under the Clinical Laboratory Improvement Amendments CLIA as qualified to perform high complexity clinical laboratory testing.    * 50,000 COLONIES/mL KLEBSIELLA PNEUMONIAE    Coagulation Studies: Recent Labs    03/23/24 1654  LABPROT 15.6*  INR 1.2    Urinalysis: Recent Labs    03/23/24 1516  COLORURINE YELLOW*  LABSPEC 1.010  PHURINE 7.0  GLUCOSEU NEGATIVE  HGBUR LARGE*  BILIRUBINUR NEGATIVE  KETONESUR NEGATIVE  PROTEINUR NEGATIVE  NITRITE POSITIVE*  LEUKOCYTESUR MODERATE*      Imaging: DG Chest 1 View Result Date: 03/24/2024 CLINICAL DATA:  Congestive heart failure. EXAM: CHEST  1 VIEW COMPARISON:  03/23/2024 FINDINGS: Stable enlarged cardiac silhouette and tortuous and calcified thoracic aorta. Moderate-sized hiatal hernia. Clear lungs with normal vascularity. Mild chronic interstitial prominence. Moderate thoracic spine degenerative changes and mild  scoliosis. IMPRESSION: 1. No acute abnormality. 2. Stable cardiomegaly. 3. Moderate-sized hiatal hernia. Electronically Signed   By: Elspeth Bathe M.D.   On: 03/24/2024 11:28   US  RENAL Result Date: 03/23/2024 EXAM: US  Retroperitoneum Complete, Renal. 03/23/2024 05:51:35 PM TECHNIQUE: Real-time ultrasonography of the retroperitoneum renal was performed. COMPARISON: None  available CLINICAL HISTORY: AKI (acute kidney injury). FINDINGS: FINDINGS: RIGHT KIDNEY/URETER: Right kidney measures 10.4 x 5.3 x 4.8 cm (138 mL). Normal cortical echogenicity. Mild fullness of the right renal collecting system without frank hydronephrosis. No calculus. No mass. LEFT KIDNEY/URETER: Left kidney measures 7.0 x 4.2 x 3.5 cm (55 mL). Normal cortical echogenicity. Mild fullness of the left renal collecting system without frank hydronephrosis. No calculus. No mass. BLADDER: Status post cystectomy. IMPRESSION: 1. Mild fullness of the bilateral renal collecting systems without frank hydronephrosis. 2. Status post cystectomy. Electronically signed by: Pinkie Pebbles MD 03/23/2024 09:32 PM EST RP Workstation: [id number]   DG Chest 1 View Result Date: 03/23/2024 EXAM: 1 VIEW(S) XRAY OF THE CHEST 03/23/2024 03:34:00 PM COMPARISON: 12/09/2023 CLINICAL HISTORY: CHF (congestive heart failure) (HCC) FINDINGS: LUNGS AND PLEURA: No focal pulmonary opacity. No pleural effusion. No pneumothorax. HEART AND MEDIASTINUM: Cardiomegaly. Tortuous thoracic aorta. Atherosclerotic calcifications. BONES AND SOFT TISSUES: Scoliosis of thoracolumbar spine. IMPRESSION: 1. Cardiomegaly with tortuous thoracic aorta and atherosclerotic calcifications. 2. Scoliosis of the thoracolumbar spine. Electronically signed by: Franky Stanford MD 03/23/2024 09:16 PM EST RP Workstation: [id number]   CARDIAC CATHETERIZATION Result Date: 03/23/2024   Ost LM to Mid LM lesion is 70% stenosed.   Mid RCA to Dist RCA lesion is 35% stenosed.   LV end diastolic pressure is  normal.   There is moderate aortic valve stenosis. Conclusions: Severe left main coronary artery disease with 70% proximal stenosis.  Otherwise, mild-moderate coronary artery disease. Normal left and right heart filling pressures. Moderately reduced Fick cardiac output/index (CO 3.1 L/min, CI 1.8 L/min/m). Moderate aortic valve stenosis (peak-to-peak gradient 22 mmHg).  Recommendations: Admit for optimization of heart failure and further evaluation of suspected critical limb ischemia involving the left foot and worsening leukocytosis. May need to consider transfer to [facility] for consideration of LMCA stenting, though given the patient's comorbidities, palliative medical therapy will also need to be considered.  I do not think he is a candidate for CABG. Gentle postcatheterization hydration in the setting of low normal filling pressures and CKD stage IV. Lonni Hanson, MD [facility]    Assessment & Plan: Pt is a 83 y.o. male with a PMHx of chronic systolic heart failure ejection fraction 30 to 35%, hypertension, hypothyroidism, DVT, paroxysmal atrial fibrillation on Eliquis , bladder cancer status post cystectomy with chronic urostomy, peripheral vascular disease, who was admitted to [facility] on 03/23/2024 for elective left heart catheterization.   1.  Chronic kidney disease stage IV.  Patient close to his baseline creatinine at this time.  He was exposed to contrast yesterday.  He also has upcoming angiogram early next week.  He also has known heart failure.  We will proceed with cautious hydration with 0 point normal saline at 40 cc/h.  He was due for outpatient evaluation in our office in the coming weeks as well.  2.  Coronary artery disease/chronic systolic heart failure.  Patient underwent cardiac catheterization on 03/23/2024.  He had 70% stenosis in the left main coronary artery.  3.  Secondary hyperparathyroidism.  Serum calcium  a bit low at 8.8.  Monitor bone metabolism parameters over the  course of the hospitalization.  4.  Peripheral vascular disease.  Patient due for angiogram of the left lower extremity early next week.  Starting the patient on gentle hydration given known chronic kidney disease.       [1]  Allergies Allergen Reactions   Losartan Potassium Swelling    angioedema Other reaction(s): Angioedema   Shellfish  Allergy Swelling and Rash    Rash, itching, swelling   Currently eats SHELLFISH without symptoms   Shellfish Protein-Containing Drug Products Dermatitis and Swelling    Rash, itching, swelling    Rash, itching, swelling   Currently eats SHELLFISH without symptoms   Doxycycline  Other (See Comments)    Blisters on hand   Ultram  [Tramadol Hcl] Nausea And Vomiting   Celecoxib Itching, Swelling, Rash and Other (See Comments)    Cannot take when eating shellfish   Codeine Palpitations    Heart racing   Cyclobenzaprine Other (See Comments)    Musculoskeletal therapy agents cause Excessive sedation Musculoskeletal therapy agents cause excessive sedation   Tramadol Hcl Nausea And Vomiting

## 2024-03-25 DIAGNOSIS — N179 Acute kidney failure, unspecified: Secondary | ICD-10-CM | POA: Diagnosis not present

## 2024-03-25 DIAGNOSIS — I2510 Atherosclerotic heart disease of native coronary artery without angina pectoris: Secondary | ICD-10-CM | POA: Diagnosis not present

## 2024-03-25 DIAGNOSIS — I350 Nonrheumatic aortic (valve) stenosis: Secondary | ICD-10-CM

## 2024-03-25 DIAGNOSIS — I4892 Unspecified atrial flutter: Secondary | ICD-10-CM | POA: Diagnosis not present

## 2024-03-25 DIAGNOSIS — I4891 Unspecified atrial fibrillation: Secondary | ICD-10-CM

## 2024-03-25 LAB — CBC
HCT: 40 % (ref 39.0–52.0)
Hemoglobin: 12 g/dL — ABNORMAL LOW (ref 13.0–17.0)
MCH: 24.9 pg — ABNORMAL LOW (ref 26.0–34.0)
MCHC: 30 g/dL (ref 30.0–36.0)
MCV: 83.2 fL (ref 80.0–100.0)
Platelets: 472 K/uL — ABNORMAL HIGH (ref 150–400)
RBC: 4.81 MIL/uL (ref 4.22–5.81)
RDW: 23.3 % — ABNORMAL HIGH (ref 11.5–15.5)
WBC: 23.4 K/uL — ABNORMAL HIGH (ref 4.0–10.5)
nRBC: 0 % (ref 0.0–0.2)

## 2024-03-25 LAB — BASIC METABOLIC PANEL WITH GFR
Anion gap: 12 (ref 5–15)
BUN: 45 mg/dL — ABNORMAL HIGH (ref 8–23)
CO2: 18 mmol/L — ABNORMAL LOW (ref 22–32)
Calcium: 7.8 mg/dL — ABNORMAL LOW (ref 8.9–10.3)
Chloride: 107 mmol/L (ref 98–111)
Creatinine, Ser: 2.37 mg/dL — ABNORMAL HIGH (ref 0.61–1.24)
GFR, Estimated: 26 mL/min — ABNORMAL LOW (ref >60)
Glucose, Bld: 111 mg/dL — ABNORMAL HIGH (ref 70–99)
Potassium: 4.2 mmol/L (ref 3.5–5.1)
Sodium: 138 mmol/L (ref 135–145)

## 2024-03-25 LAB — THYROID PANEL WITH TSH
Free Thyroxine Index: 2.7 (ref 1.2–4.9)
T3 Uptake Ratio: 36 % (ref 24–39)
T4, Total: 7.5 ug/dL (ref 4.5–12.0)
TSH: 9.61 u[IU]/mL — ABNORMAL HIGH (ref 0.450–4.500)

## 2024-03-25 LAB — MAGNESIUM: Magnesium: 1.9 mg/dL (ref 1.7–2.4)

## 2024-03-25 LAB — LIPOPROTEIN A (LPA): Lipoprotein (a): 35.3 nmol/L — ABNORMAL HIGH (ref <75.0)

## 2024-03-25 LAB — HEPARIN LEVEL (UNFRACTIONATED)
Heparin Unfractionated: <0.1 [IU]/mL — ABNORMAL LOW (ref 0.30–0.70)
Heparin Unfractionated: 0.28 [IU]/mL — ABNORMAL LOW (ref 0.30–0.70)

## 2024-03-25 MED ORDER — HEPARIN BOLUS VIA INFUSION
950.0000 [IU] | Freq: Once | INTRAVENOUS | Status: AC
  Administered 2024-03-25: 950 [IU] via INTRAVENOUS
  Filled 2024-03-25: qty 950

## 2024-03-25 MED ORDER — HEPARIN BOLUS VIA INFUSION
2000.0000 [IU] | Freq: Once | INTRAVENOUS | Status: AC
  Administered 2024-03-25: 2000 [IU] via INTRAVENOUS
  Filled 2024-03-25: qty 2000

## 2024-03-25 NOTE — Progress Notes (Addendum)
 1      PROGRESS NOTE    Jerry Mcpherson  FMW:[id number] DOB: 1940/12/11 DOA: 03/23/2024 PCP: [name], MD   Brief Narrative:  83 y.o. male with past medical history of recurrent auricles, chronic HFrEF with LVEF 30-35%, HTN, hypothyroidism, DVT, PAF on Eliquis , bladder cancer status post cystectomy and chronic urostomy, PVD, who has come in today for elective LHC.   12/12: Vascular surgery, cardiology consultation, right/left heart cath  12/13: Transfer to stepdown, intensivist consult due to persistent hypotension, nephrology and palliative care consult 12/14: transfer to PCU, LEFT lower extremity angiogram possibly tomorrow   Assessment & Plan:   Principal Problem:   Chronic HFrEF (heart failure with reduced ejection fraction) (HCC) Active Problems:   Atrial flutter (HCC)   Shock circulatory (HCC)   Atrial fibrillation with RVR (HCC)   AKI (acute kidney injury)   Pressure injury of skin   Atrial flutter with RVR - On heparin  drip, needed transfer to SD for <24 hrs for persistent hypotension - - needed Levophed  for a short period on 12/13 and has been off since then - continue IV amiodarone  - converted to NSR and HR/BP stabilized since then. - No need for cardioversion  - cardio following  Left 3rd and 4th toe chronic ulcer History of PVD - The ulcer appears to be chronic, likely secondary to PVD as pedal pulses on the left significantly diminished compared to the right side, has been followed by wound care center with weekly dressing change. wound appears to be stable and no signs of infection. - Conservative management, consult wound care. - Hold off antibiotics. - Seen by vascular surgery-plan to perform angiogram of the left lower extremity for possible potential revascularization on Monday/tomorrow   Chronic leukocytosis - As per oncology's note from earlier this month, chronic leukocytosis likely secondary to polycythemia vera, for which patient has been  taking hydroxyurea . - Patient denied any cough, no urinary symptoms or diarrhea and left fourth toe ulcer appears to be chronic, less likely has a active infection at this point.  Plan to monitor off antibiotics.   Decompensated combined non-anion gap metabolic acidosis and respiratory alkalosis  CKD stage IV Bladder cancer status post resection with chronic urostomy - Euvolemic, creatinine level stable - Nephrology seen, continue IVF   Syncope - Likely secondary to worsening of chronic HFrEF as well as moderate to severe aortic stenosis - followed by cardiology, - conservative management. - He appears to be euvolemic at this point  CAD with left main stenosis seen on cardiac cath this admission Chronic systolic and diastolic heart failure with EF of 30 to 35% Moderate aortic stenosis May need transfer to [facility] For now continue heparin  and Plavix , statin   Polycythemia vera - Hemoglobin stable, continue hydroxyurea  - Patient follow-up with hematology.   Hypothyroidism - Continue Synthroid    Goals of care-palliative care seen,  His overall prognosis is poor   DVT prophylaxis: Heparin  drip      Code Status: Full code Family Communication: None at bedside Disposition Plan: Possible discharge in 2-3 days depending on clinical condition, cardiology, nephrology and vascular surgery evaluation   Consultants:  Nephrology Vascular surgery Cardiology Intensivist Palliative care     Subjective:  Feels better, appreciative of his care here  Objective: Vitals:   03/25/24 1400 03/25/24 1431 03/25/24 1649 03/25/24 2015  BP: 133/89 (!) 113/59 115/68 115/82  Pulse: 80 73 69 86  Resp: (!) 26 16 18  (!) 25  Temp:  97.9  F (36.6 C) (!) 97.4 F (36.3 C) 98.4 F (36.9 C)  TempSrc:    Oral  SpO2: 95% 95% 99% 98%  Weight:      Height:        Intake/Output Summary (Last 24 hours) at 03/25/2024 2102 Last data filed at 03/25/2024 1927 Gross per 24 hour  Intake 1242.39  ml  Output 1750 ml  Net -507.61 ml   Filed Weights   03/23/24 0909 03/24/24 1400  Weight: 65.7 kg 64.1 kg    Examination:  General exam: Appears calm and comfortable, elderly and chronically ill/cachectic looking Respiratory system: Clear to auscultation. Respiratory effort normal. Cardiovascular system: S1,S2 normal. No M/R/G Gastrointestinal system: Abdomen is soft, benign Central nervous system: Alert and oriented. No focal neurological deficits. Extremities: Symmetric 5 x 5 power. Skin: Left foot in the dressing for small superficial ulceration between 3rd and 4th toe Psychiatry: Judgement and insight appear normal. Mood & affect appropriate.     Data Reviewed: I have personally reviewed following labs and imaging studies  CBC: Recent Labs  Lab 03/21/24 1001 03/23/24 1335 03/23/24 1338 03/23/24 1654 03/24/24 0346 03/25/24 0307  WBC 26.1*  --   --  23.3* 24.5* 23.4*  NEUTROABS  --   --   --  19.1*  --   --   HGB 13.4 16.0 16.0 16.0 13.6 12.0*  HCT 44.9 47.0 47.0 52.3* 45.9 40.0  MCV 85  --   --  83.7 85.2 83.2  PLT 592*  --   --  579* 556* 472*   Basic Metabolic Panel: Recent Labs  Lab 03/21/24 1001 03/23/24 1335 03/23/24 1338 03/24/24 0346 03/24/24 1431 03/25/24 0307  NA 141 143 142 140  --  138  K 4.9 4.4 4.5 4.5 4.7 4.2  CL 106  --   --  109  --  107  CO2 18*  --   --  20*  --  18*  GLUCOSE 102*  --   --  99  --  111*  BUN 52*  --   --  45*  --  45*  CREATININE 2.43*  --   --  2.26*  --  2.37*  CALCIUM  9.0  --   --  8.8*  --  7.8*  MG  --   --   --   --  2.0 1.9    Coagulation Profile: Recent Labs  Lab 03/23/24 1654  INR 1.2    Sepsis Labs: Recent Labs  Lab 03/23/24 1654  LATICACIDVEN 1.6    Recent Results (from the past 240 hours)  MRSA Next Gen by PCR, Nasal     Status: None   Collection Time: 03/24/24  1:43 PM   Specimen: Nasal Mucosa; Nasal Swab  Result Value Ref Range Status   MRSA by PCR Next Gen NOT DETECTED NOT DETECTED  Final    Comment: (NOTE) The GeneXpert MRSA Assay (FDA approved for NASAL specimens only), is one component of a comprehensive MRSA colonization surveillance program. It is not intended to diagnose MRSA infection nor to guide or monitor treatment for MRSA infections. Test performance is not FDA approved in patients less than 4 years old. Performed at [facility], [address] [place], [place] [address]          Radiology Studies: DG Chest 1 View Result Date: 03/24/2024 CLINICAL DATA:  Congestive heart failure. EXAM: CHEST  1 VIEW COMPARISON:  03/23/2024 FINDINGS: Stable enlarged cardiac silhouette and tortuous and calcified thoracic aorta.  Moderate-sized hiatal hernia. Clear lungs with normal vascularity. Mild chronic interstitial prominence. Moderate thoracic spine degenerative changes and mild scoliosis. IMPRESSION: 1. No acute abnormality. 2. Stable cardiomegaly. 3. Moderate-sized hiatal hernia. Electronically Signed   By: Elspeth Bathe M.D.   On: 03/24/2024 11:28        Scheduled Meds:  atorvastatin   80 mg Oral Daily   Chlorhexidine  Gluconate Cloth  6 each Topical Daily   ciprofloxacin   2 drop Left Eye Q4H while awake   clopidogrel   75 mg Oral Daily   ferrous sulfate   325 mg Oral Daily   fluticasone  furoate-vilanterol  1 puff Inhalation Daily   hydroxyurea   500 mg Oral Daily   levothyroxine   50 mcg Oral Q0600   sodium bicarbonate   1,300 mg Oral BID   sodium chloride  flush  3 mL Intravenous Q12H   Continuous Infusions:  sodium chloride  40 mL/hr at 03/25/24 1449   amiodarone  30 mg/hr (03/25/24 1309)   heparin  1,550 Units/hr (03/25/24 1954)   norepinephrine  (LEVOPHED ) Adult infusion Stopped (03/24/24 1630)     LOS: 1 day    Time spent : 35 minutes    Lashondra Vaquerano Maree, MD [facility] Pager [phone]  If 7PM-7AM, please contact night-coverage  03/25/2024, 9:02 PM

## 2024-03-25 NOTE — Progress Notes (Signed)
 Patient to be transported to 2A 260. Vital signs stable, no complaints at this time. Report given to Pra[place]a, CHARITY FUNDRAISER.

## 2024-03-25 NOTE — Plan of Care (Signed)

## 2024-03-25 NOTE — H&P (View-Only) (Signed)
 Vascular Surgery  Will plan for LEFT lower extremity angiogram; possible intervention tomorrow.  Stable from Medicine, Nephrology and Cardiology standpoint.  NPO after MN.

## 2024-03-25 NOTE — Progress Notes (Signed)
 Vascular Surgery  Will plan for LEFT lower extremity angiogram; possible intervention tomorrow.  Stable from Medicine, Nephrology and Cardiology standpoint.  NPO after MN.

## 2024-03-25 NOTE — Progress Notes (Signed)
 PHARMACY - ANTICOAGULATION CONSULT NOTE  Pharmacy Consult for Heparin  Indication: chest pain/ACS  Allergies[1]  Patient Measurements: Height: 5' 7 (170.2 cm) Weight: 64.1 kg (141 lb 5 oz) IBW/kg (Calculated) : 66.1 HEPARIN  DW (KG): 64.1  Labs: Recent Labs    03/23/24 1654 03/24/24 0346 03/24/24 0815 03/24/24 2001 03/25/24 0307 03/25/24 0758 03/25/24 1853  HGB 16.0 13.6  --   --  12.0*  --   --   HCT 52.3* 45.9  --   --  40.0  --   --   PLT 579* 556*  --   --  472*  --   --   APTT 45*  --   --   --   --   --   --   LABPROT 15.6*  --   --   --   --   --   --   INR 1.2  --   --   --   --   --   --   HEPARINUNFRC  --   --    < > <0.10*  --  <0.10* 0.28*  CREATININE  --  2.26*  --   --  2.37*  --   --    < > = values in this interval not displayed.    Estimated Creatinine Clearance: 20.7 mL/min (A) (by C-G formula based on SCr of 2.37 mg/dL (H)).   Medical History: Past Medical History:  Diagnosis Date   Acute deep vein thrombosis (DVT) of right femoral vein (HCC) 10/28/2020   US  10/24/2020 [facility] Acute right lower extremity DVT extending from the common femoral vein to  the popliteal vein.     Bladder cancer (HCC)    Genital herpes    Hyperglycemia    Hypertension    Squamous cell carcinoma    Assessment: Patient is a 83 yo M presenting for right/left heart catheterization ad coronary angiography. PMH significant for essential hypertension, recurrent vertigo, polycythemia vera, hypothyroidism, history of DVTs, atrial fibrillation and flutter, history of bladder cancer peripheral arterial disease, and UTI. Pharmacy is consulted for Heparin  dosing and monitoring s/p heart cath and angiography. Per cardiology MD, plan is to start Heparin  infusion 8 hours post sheath removal. No initial bolus needed per MD. Sheath removed at ~16:20.   Baseline CBC with normal Hgb and elevated platelets, INR 1.2, and aPTT 45s  Med rec states patient is taking Eliquis  5 mg daily. Dispense  report shows Eliquis  2.5 mg BID last filled 12/20/23 for 30d supply.   Date Time HL Rate/Comment  1213  0815  < 0.1 Subthera; 800 un/hr 1213 2001 < 0.1  Subthera; 1000 un/hr 1214 0758 < 0.1 Subthera; 1150 un/hr  1214 1853 0.28 Subtherapeutic  Goal of Therapy:  Heparin  level 0.3-0.7 units/ml Monitor platelets by anticoagulation protocol: Yes   Plan:  --Heparin  level remains subtherapeutic --Heparin  950 unit IV bolus and increase heparin  infusion rate to 1550 units/hr --Re-check HL 8 hours from rate change --Daily CBC per protocol while on IV heparin    [name] PharmD, BCPS 03/25/2024 7:24 PM     [1]  Allergies Allergen Reactions   Losartan Potassium Swelling    angioedema Other reaction(s): Angioedema   Shellfish Allergy Swelling and Rash    Rash, itching, swelling   Currently eats SHELLFISH without symptoms   Shellfish Protein-Containing Drug Products Dermatitis and Swelling    Rash, itching, swelling    Rash, itching, swelling   Currently eats SHELLFISH without symptoms   Doxycycline  Other (  See Comments)    Blisters on hand   Ultram  [Tramadol Hcl] Nausea And Vomiting   Celecoxib Itching, Swelling, Rash and Other (See Comments)    Cannot take when eating shellfish   Codeine Palpitations    Heart racing   Cyclobenzaprine Other (See Comments)    Musculoskeletal therapy agents cause Excessive sedation Musculoskeletal therapy agents cause excessive sedation   Tramadol Hcl Nausea And Vomiting

## 2024-03-25 NOTE — Consult Note (Signed)
 Consultation Note Date: 03/25/2024 at 0914 Reason for consultation: Goals of care    Patient Name: Jerry Mcpherson  DOB: 01-May-1940  MRN: [id number]  Age / Sex: 83 y.o., male  PCP: Jerry Nancyann BRAVO, MD Referring Physician: Maree Hue, MD  HPI/Patient Profile: 83 y.o. male  with past medical history significant for recurrent auricles, chronic HFrEF with LVEF 30-35%, HTN, hypothyroidism, DVT, PAF on Eliquis , bladder cancer s/p cystectomy w/ chronic urostomy, PVD and polycythemia vera. Patient presented to [facility] for elective RHC/LHC and coronary angiography.   Findings of cardiac catherization 03/23/2024: Severe left main coronary artery disease with 70% proximal stenosis.  Otherwise, mild-moderate CAD. Normal left and right heart filling pressures. Moderately reduced Fick cardiac output/index (CO 3.1 L/min, CI 1.8 L/min/m).  Pt was referred for admission for: Optimization of heart failure and further evaluation of suspected critical limb ischemia involving the left foot and worsening leukocytosis. May need to consider transfer to [facility] for consideration of LMCA stenting, though given the patient's comorbidities, palliative medical therapy will also need to be considered.  I do not think he is a candidate for CABG. Gentle postcatheterization hydration in the setting of low normal filling pressures and CKD stage IV. Vascular was consulted and recommended to undergo angiogram of LLE for further evaluation and potential revascularization with hope of salvaging limb after assessment concerning for critical L lower limb ischemia.   Pt was admitted for management of A flutter with RVR, L 3rd and 4th chronic toe ulcer, chronic leukocytosis, CKD IV, chronic HFrEF and polycythemia vera.   Palliative team was consulted to assist with goals of care conversations.     Clinical Assessment and Goals of  Care: Extensive chart review completed prior to meeting patient including labs, vital signs, imaging, progress notes, orders, and available advanced directive documents from current and previous encounters. I then met with patient to discuss diagnosis prognosis, GOC, EOL wishes, disposition and options.  I introduced Palliative Medicine as specialized medical care for people living with serious illness. It focuses on providing relief from the symptoms and stress of a serious illness. The goal is to improve quality of life for both the patient and the family.     Latest Ref Rng & Units 03/25/2024    3:07 AM 03/24/2024    3:46 AM 03/23/2024    4:54 PM  CBC  WBC 4.0 - 10.5 K/uL 23.4  24.5  23.3   Hemoglobin 13.0 - 17.0 g/dL 87.9  86.3  83.9   Hematocrit 39.0 - 52.0 % 40.0  45.9  52.3   Platelets 150 - 400 K/uL 472  556  579       Latest Ref Rng & Units 03/25/2024    3:07 AM 03/24/2024    2:31 PM 03/24/2024    3:46 AM  CMP  Glucose 70 - 99 mg/dL 888   99   BUN 8 - 23 mg/dL 45   45   Creatinine 9.38 - 1.24 mg/dL 7.62   7.73   Sodium 864 -  145 mmol/L 138   140   Potassium 3.5 - 5.1 mmol/L 4.2  4.7  4.5   Chloride 98 - 111 mmol/L 107   109   CO2 22 - 32 mmol/L 18   20   Calcium  8.9 - 10.3 mg/dL 7.8   8.8    Ill-appearing, elderly male lying in be. He is noted to be placing large lunch order with RN. He is alert and oriented to self, DOB, time, location and situation. He is able to participate in conversation making his wishes known. Respirations are even and unlabored. He is in no distress.   Patient denies pain at this time. He endorses mild exertional dyspnea but denies CP. Reports eating breakfast this morning.   We discussed a brief life review of the patient. Jerry Mcpherson lives alone, nephew Garrel lives next door. He was married twice but is widowed. He has one daughter named Jerry Mcpherson that he has not spoken to in years. 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   As far as functional and nutritional status, Jerry Mcpherson lives alone. He performs all of his ADLs independently. He does not use DME. He was driving up until recently when his nephew set up [employer] for all of his appointments. He is able to get groceries and cook for himself, but share his appetite has not been very good lately.  He shares that home health has been seeing him in the home for care but wound has not healed.   We discussed patient's current illness and what it means in the larger context of patient's on-going co-morbidities.  Natural disease trajectory and expectations at EOL were discussed. Jerry Mcpherson understands that his heart is weak and that he needs exam to see if they can get the blood flow back.   I attempted to elicit values and goals of care important to the patient. Jerry Mcpherson wants to get his heart straightened out and go home.   The difference between aggressive medical intervention and comfort care was considered in light of the patient's goals of care.   Advance directives, concepts specific to code status, artificial feeding and hydration, and rehospitalization were considered and discussed. Jerry Mcpherson endorses wanting CPR and ventilator support if needed on a temporary basis.  He remains a FULL CODE.   He would not want long term vent since his father had a trach for a long time and had poor quality of life. He is not sure about feeding tube or trach for short term care. He states he will need to think about that. He endorses he would consider dialysis if needed. He is adamant that he would not want amputations if needed.   Education offered regarding concept specific to human mortality and the limitations of medical interventions to prolong life when the body begins to fail to thrive.  Family is facing treatment option decisions, advanced directive, and anticipatory care needs.  Discussed that if he were to become confused, his daughter is the next of kin that would make his medical decisions. He states she will not make any decisions as they have not talked in years. He states her name is Jerry Mcpherson but is unsure of her last name. In the event he were to become confused and unable to make decisions for himself, he appoints his nephew, Garrel Finder, as his surrogate decision maker. Encouraged patient that  this should be written in HPOA document. Raynell refuses offer to complete HPOA documentation.    Discussed with patient/family the importance of continued conversation with family and the medical providers regarding overall plan of care and treatment options, ensuring decisions are within the context of the patients values and GOCs.    Questions and concerns were addressed. The patient was encouraged to call with questions or concerns.   Primary Decision Maker PATIENT  Physical Exam Vitals reviewed.  Constitutional:      General: He is not in acute distress.    Appearance: He is ill-appearing.  HENT:     Head: Normocephalic and atraumatic.     Mouth/Throat:     Mouth: Mucous membranes are moist.  Pulmonary:     Effort: Pulmonary effort is normal. No respiratory distress.  Musculoskeletal:     Right lower leg: No edema.     Left lower leg: No edema.  Skin:    General: Skin is warm and dry.  Neurological:     Mental Status: He is alert and oriented to person, place, and time.  Psychiatric:        Mood and Affect: Mood normal.        Behavior: Behavior normal.        Thought Content: Thought content normal.        Judgment: Judgment normal.   Recommendations/Plan: FULL CODE status as previously documented    Continue current supportive interventions Palliative will continue to follow Disposition TBD  Palliative Assessment/Data: 60-70%     Thank you for this consult. Palliative medicine will continue to follow and assist holistically.   Time Total:  105 minutes  Time spent includes: Detailed review of medical records (labs, imaging, vital signs), medically appropriate exam (mental status, respiratory, cardiac, skin), discussed with treatment team, counseling and educating patient, family and staff, documenting clinical information, medication management and coordination of care.     Devere Sacks, ELNITA- [facility] Palliative Medicine Team  03/25/2024 9:15 AM  Office [phone]  Pager [phone]     Please contact Palliative Medicine Team providers via AMION for questions and concerns.

## 2024-03-25 NOTE — Progress Notes (Signed)
 Rounding Note    Patient Name: Jerry Mcpherson Date of Encounter: 03/25/2024  [facility] Cardiologist: None   Subjective   Converted back to sinus rhythm with IV amiodarone  yesterday. Did briefly require pressors while still in flutter, but these were able to be stopped once he was back in sinus. He notes that since returning to sinus, his foot has not hurt as much. No chest pain. Breathing stable. He does have some itchiness around the skin of his left eye, is using eyedrops.  Inpatient Medications    Scheduled Meds:  atorvastatin   80 mg Oral Daily   Chlorhexidine  Gluconate Cloth  6 each Topical Daily   ciprofloxacin   2 drop Left Eye Q4H while awake   clopidogrel   75 mg Oral Daily   ferrous sulfate   325 mg Oral Daily   fluticasone  furoate-vilanterol  1 puff Inhalation Daily   hydroxyurea   500 mg Oral Daily   levothyroxine   50 mcg Oral Q0600   sodium bicarbonate   1,300 mg Oral BID   sodium chloride  flush  3 mL Intravenous Q12H   Continuous Infusions:  sodium chloride  40 mL/hr at 03/25/24 0900   amiodarone  30 mg/hr (03/25/24 0900)   heparin  1,400 Units/hr (03/25/24 1008)   norepinephrine  (LEVOPHED ) Adult infusion Stopped (03/24/24 1630)   PRN Meds: oxyCODONE  **AND** acetaminophen , acetaminophen , ondansetron  (ZOFRAN ) IV, sodium chloride  flush   Vital Signs    Vitals:   03/25/24 0700 03/25/24 0800 03/25/24 0900 03/25/24 1000  BP: 113/67 106/67 114/65 (!) 103/54  Pulse: 66 65 74 69  Resp: 13 18 (!) 26 (!) 21  Temp:  98.5 F (36.9 C)    TempSrc:  Oral    SpO2: 97% 97% 93% 93%  Weight:      Height:        Intake/Output Summary (Last 24 hours) at 03/25/2024 1033 Last data filed at 03/25/2024 0900 Gross per 24 hour  Intake 1833.09 ml  Output 1295 ml  Net 538.09 ml      03/24/2024    2:00 PM 03/23/2024    9:09 AM 03/15/2024   10:47 AM  Last 3 Weights  Weight (lbs) 141 lb 5 oz 144 lb 14.4 oz 143 lb  Weight (kg) 64.1 kg 65.726 kg 64.864 kg       Telemetry    Converted to SR yesterday late afternoon - Personally Reviewed  Physical Exam   GEN: in no acute distress NECK: No JVD CARDIAC: regular rhythm, normal S1 and S2, no rubs or gallops. 2-3/6 systolic murmur. VASCULAR: Radial pulses 2+ bilaterally.  RESPIRATORY:  Clear to auscultation in upper fields with bibasilar rales ABDOMEN: Soft, non-tender, non-distended MUSCULOSKELETAL:  Moves all 4 limbs independently SKIN: Warm and dry, no edema NEUROLOGIC:  No focal neuro deficits noted. PSYCHIATRIC:  Normal affect    New pertinent results (labs, ECG, imaging, cardiac studies)    ECG this AM SR  Assessment & Plan    Atrial flutter with RVR: was hypotensive while in flutter, required pressors briefly. Converted 12/13 on IV amiodarone , blood pressure improved in sinus and was able to come off pressors. Chadsvasc=7, on IV heparin , continue. With plans for possible LE angiogram tomorrow, will continue IV amiodarone  for now, if remains in sinus after procedure can transition back to oral amiodarone   CAD with left main stenosis: discussed that location is high risk and should be done in a location with surgical backup. Decision will need to be made on timing of transfer/intervention, given plans for peripheral  angio tomorrow. On heparin  and clopidogrel . On statin.   Chronic systolic and diastolic heart failure: EF 30-35%. CAD as above. LVEDP normal on cath, but after 500 cc fluids 12/13 he has rales bilaterally. Nephrology placed on IV fluids at 40ml/hr given contrast. Cardiac index is 1.8.    Aortic stenosis: moderate by cath   PAD: vascular surgery following. Planned for peripheral angio tomorrow per notes   Chronic kidney disease stage 4: recent outpatient GFRs <30. Complicates contrast dye use, but with heart failure need to be cautious with fluids. Being managed by nephrology.  From cardiac standpoint, given that his blood pressure improved with conversion to sinus rhythm, ok  to transfer out of unit from my perspective.    Signed, Shelda Bruckner, MD  03/25/2024, 10:33 AM

## 2024-03-25 NOTE — Plan of Care (Signed)
°  Problem: Cardiovascular: Goal: Ability to achieve and maintain adequate cardiovascular perfusion will improve Outcome: Progressing Goal: Vascular access site(s) Level 0-1 will be maintained Outcome: Progressing   Problem: Clinical Measurements: Goal: Ability to maintain clinical measurements within normal limits will improve Outcome: Progressing Goal: Will remain free from infection Outcome: Progressing Goal: Diagnostic test results will improve Outcome: Progressing Goal: Cardiovascular complication will be avoided Outcome: Progressing   Problem: Coping: Goal: Level of anxiety will decrease Outcome: Progressing

## 2024-03-25 NOTE — Progress Notes (Signed)
 PHARMACY - ANTICOAGULATION CONSULT NOTE  Pharmacy Consult for Heparin  Indication: chest pain/ACS  Allergies[1]  Patient Measurements: Height: 5' 7 (170.2 cm) Weight: 64.1 kg (141 lb 5 oz) IBW/kg (Calculated) : 66.1 HEPARIN  DW (KG): 64.1  Labs: Recent Labs    03/23/24 1654 03/24/24 0346 03/24/24 0815 03/24/24 2001 03/25/24 0307 03/25/24 0758  HGB 16.0 13.6  --   --  12.0*  --   HCT 52.3* 45.9  --   --  40.0  --   PLT 579* 556*  --   --  472*  --   APTT 45*  --   --   --   --   --   LABPROT 15.6*  --   --   --   --   --   INR 1.2  --   --   --   --   --   HEPARINUNFRC  --   --  <0.10* <0.10*  --  <0.10*  CREATININE  --  2.26*  --   --  2.37*  --     Estimated Creatinine Clearance: 20.7 mL/min (A) (by C-G formula based on SCr of 2.37 mg/dL (H)).   Medical History: Past Medical History:  Diagnosis Date   Acute deep vein thrombosis (DVT) of right femoral vein (HCC) 10/28/2020   US  10/24/2020 [facility] Acute right lower extremity DVT extending from the common femoral vein to  the popliteal vein.     Bladder cancer (HCC)    Genital herpes    Hyperglycemia    Hypertension    Squamous cell carcinoma    Assessment: Patient is a 83 yo M presenting for right/left heart catheterization ad coronary angiography. PMH significant for essential hypertension, recurrent vertigo, polycythemia vera, hypothyroidism, history of DVTs, atrial fibrillation and flutter, history of bladder cancer peripheral arterial disease, and UTI. Pharmacy is consulted for Heparin  dosing and monitoring s/p heart cath and angiography. Per cardiology MD, plan is to start Heparin  infusion 8 hours post sheath removal. No initial bolus needed per MD. Sheath removed at ~16:20.   Baseline CBC with normal Hgb and elevated platelets, INR 1.2, and aPTT 45s  Med rec states patient is taking Eliquis  5 mg daily. Dispense report shows Eliquis  2.5 mg BID last filled 12/20/23 for 30d supply.   Date Time HL Rate/Comment  1213   0815  < 0.1 Subthera; 800 un/hr 1213 2001 < 0.1  Subthera; 1000 un/hr 1214 0758 < 0.1 Subthera; 1150 un/hr   Goal of Therapy:  Heparin  level 0.3-0.7 units/ml Monitor platelets by anticoagulation protocol: Yes   Plan:  --Heparin  level is subtherapeutic --Heparin  2000 unit IV bolus and increase heparin  infusion rate to 1400 units/hr --Re-check HL 8 hours from rate change --Daily CBC per protocol while on IV heparin   Jerry Mcpherson 03/25/2024 10:00 AM     [1]  Allergies Allergen Reactions   Losartan Potassium Swelling    angioedema Other reaction(s): Angioedema   Shellfish Allergy Swelling and Rash    Rash, itching, swelling   Currently eats SHELLFISH without symptoms   Shellfish Protein-Containing Drug Products Dermatitis and Swelling    Rash, itching, swelling    Rash, itching, swelling   Currently eats SHELLFISH without symptoms   Doxycycline  Other (See Comments)    Blisters on hand   Ultram  [Tramadol Hcl] Nausea And Vomiting   Celecoxib Itching, Swelling, Rash and Other (See Comments)    Cannot take when eating shellfish   Codeine Palpitations    Heart racing  Cyclobenzaprine Other (See Comments)    Musculoskeletal therapy agents cause Excessive sedation Musculoskeletal therapy agents cause excessive sedation   Tramadol Hcl Nausea And Vomiting

## 2024-03-25 NOTE — Progress Notes (Signed)
 [facility]  ROUNDING NOTE   Subjective:   Patient resting comfortably in bed this a.m. Good urine output of 2.3 L. Remains on amiodarone  drip and IV fluids. Due for left lower extremity angiogram tomorrow.  Objective:  Vital signs in last 24 hours:  Temp:  [98 F (36.7 C)-98.5 F (36.9 C)] 98 F (36.7 C) (12/14 1200) Pulse Rate:  [65-138] 71 (12/14 1200) Resp:  [8-29] 20 (12/14 1200) BP: (76-120)/(50-89) 98/66 (12/14 1200) SpO2:  [92 %-98 %] 97 % (12/14 1200) Weight:  [64.1 kg] 64.1 kg (12/13 1400)  Weight change: -1.626 kg Filed Weights   03/23/24 0909 03/24/24 1400  Weight: 65.7 kg 64.1 kg    Intake/Output: I/O last 3 completed shifts: In: 2420.6 [P.O.:960; I.V.:1460.6] Out: 2315 [Urine:2315]   Intake/Output this shift:  Total I/O In: 290.6 [I.V.:290.6] Out: 350 [Urine:350]  Physical Exam: General: No acute distress  Head: Normocephalic, atraumatic. Moist oral mucosal membranes  Neck: Supple  Lungs:  Clear to auscultation, normal effort  Heart: S1S2 irregular  Abdomen:  Soft, nontender, bowel sounds present, urostomy present  Extremities: No peripheral edema.  Neurologic: Awake, alert, following commands  Skin: No acute rash  Access: No hemodialysis access    Basic Metabolic Panel: Recent Labs  Lab 03/21/24 1001 03/23/24 1335 03/23/24 1338 03/24/24 0346 03/24/24 1431 03/25/24 0307  NA 141 143 142 140  --  138  K 4.9 4.4 4.5 4.5 4.7 4.2  CL 106  --   --  109  --  107  CO2 18*  --   --  20*  --  18*  GLUCOSE 102*  --   --  99  --  111*  BUN 52*  --   --  45*  --  45*  CREATININE 2.43*  --   --  2.26*  --  2.37*  CALCIUM  9.0  --   --  8.8*  --  7.8*  MG  --   --   --   --  2.0 1.9    Liver Function Tests: No results for input(s): AST, ALT, ALKPHOS, BILITOT, PROT, ALBUMIN in the last 168 hours. No results for input(s): LIPASE, AMYLASE in the last 168 hours. No results for input(s): AMMONIA in the last 168  hours.  CBC: Recent Labs  Lab 03/21/24 1001 03/23/24 1335 03/23/24 1338 03/23/24 1654 03/24/24 0346 03/25/24 0307  WBC 26.1*  --   --  23.3* 24.5* 23.4*  NEUTROABS  --   --   --  19.1*  --   --   HGB 13.4 16.0 16.0 16.0 13.6 12.0*  HCT 44.9 47.0 47.0 52.3* 45.9 40.0  MCV 85  --   --  83.7 85.2 83.2  PLT 592*  --   --  579* 556* 472*    Cardiac Enzymes: No results for input(s): CKTOTAL, CKMB, CKMBINDEX, TROPONINI in the last 168 hours.  BNP: Invalid input(s): POCBNP  CBG: No results for input(s): GLUCAP in the last 168 hours.  Microbiology: Results for orders placed or performed during the hospital encounter of 03/23/24  MRSA Next Gen by PCR, Nasal     Status: None   Collection Time: 03/24/24  1:43 PM   Specimen: Nasal Mucosa; Nasal Swab  Result Value Ref Range Status   MRSA by PCR Next Gen NOT DETECTED NOT DETECTED Final    Comment: (NOTE) The GeneXpert MRSA Assay (FDA approved for NASAL specimens only), is one component of a comprehensive MRSA colonization surveillance program. It  is not intended to diagnose MRSA infection nor to guide or monitor treatment for MRSA infections. Test performance is not FDA approved in patients less than 81 years old. Performed at [facility], [address] [place], [place] [address]     Coagulation Studies: Recent Labs    03/23/24 1654  LABPROT 15.6*  INR 1.2    Urinalysis: Recent Labs    03/23/24 1516  COLORURINE YELLOW*  LABSPEC 1.010  PHURINE 7.0  GLUCOSEU NEGATIVE  HGBUR LARGE*  BILIRUBINUR NEGATIVE  KETONESUR NEGATIVE  PROTEINUR NEGATIVE  NITRITE POSITIVE*  LEUKOCYTESUR MODERATE*      Imaging: DG Chest 1 View Result Date: 03/24/2024 CLINICAL DATA:  Congestive heart failure. EXAM: CHEST  1 VIEW COMPARISON:  03/23/2024 FINDINGS: Stable enlarged cardiac silhouette and tortuous and calcified thoracic aorta. Moderate-sized hiatal hernia. Clear lungs with normal vascularity. Mild chronic  interstitial prominence. Moderate thoracic spine degenerative changes and mild scoliosis. IMPRESSION: 1. No acute abnormality. 2. Stable cardiomegaly. 3. Moderate-sized hiatal hernia. Electronically Signed   By: Elspeth Bathe M.D.   On: 03/24/2024 11:28   US  RENAL Result Date: 03/23/2024 EXAM: US  Retroperitoneum Complete, Renal. 03/23/2024 05:51:35 PM TECHNIQUE: Real-time ultrasonography of the retroperitoneum renal was performed. COMPARISON: None available CLINICAL HISTORY: AKI (acute kidney injury). FINDINGS: FINDINGS: RIGHT KIDNEY/URETER: Right kidney measures 10.4 x 5.3 x 4.8 cm (138 mL). Normal cortical echogenicity. Mild fullness of the right renal collecting system without frank hydronephrosis. No calculus. No mass. LEFT KIDNEY/URETER: Left kidney measures 7.0 x 4.2 x 3.5 cm (55 mL). Normal cortical echogenicity. Mild fullness of the left renal collecting system without frank hydronephrosis. No calculus. No mass. BLADDER: Status post cystectomy. IMPRESSION: 1. Mild fullness of the bilateral renal collecting systems without frank hydronephrosis. 2. Status post cystectomy. Electronically signed by: Pinkie Pebbles MD 03/23/2024 09:32 PM EST RP Workstation: [id number]   DG Chest 1 View Result Date: 03/23/2024 EXAM: 1 VIEW(S) XRAY OF THE CHEST 03/23/2024 03:34:00 PM COMPARISON: 12/09/2023 CLINICAL HISTORY: CHF (congestive heart failure) (HCC) FINDINGS: LUNGS AND PLEURA: No focal pulmonary opacity. No pleural effusion. No pneumothorax. HEART AND MEDIASTINUM: Cardiomegaly. Tortuous thoracic aorta. Atherosclerotic calcifications. BONES AND SOFT TISSUES: Scoliosis of thoracolumbar spine. IMPRESSION: 1. Cardiomegaly with tortuous thoracic aorta and atherosclerotic calcifications. 2. Scoliosis of the thoracolumbar spine. Electronically signed by: Franky Stanford MD 03/23/2024 09:16 PM EST RP Workstation: [id number]   CARDIAC CATHETERIZATION Result Date: 03/23/2024   Ost LM to Mid LM lesion is 70% stenosed.    Mid RCA to Dist RCA lesion is 35% stenosed.   LV end diastolic pressure is normal.   There is moderate aortic valve stenosis. Conclusions: Severe left main coronary artery disease with 70% proximal stenosis.  Otherwise, mild-moderate coronary artery disease. Normal left and right heart filling pressures. Moderately reduced Fick cardiac output/index (CO 3.1 L/min, CI 1.8 L/min/m). Moderate aortic valve stenosis (peak-to-peak gradient 22 mmHg).  Recommendations: Admit for optimization of heart failure and further evaluation of suspected critical limb ischemia involving the left foot and worsening leukocytosis. May need to consider transfer to [facility] for consideration of LMCA stenting, though given the patient's comorbidities, palliative medical therapy will also need to be considered.  I do not think he is a candidate for CABG. Gentle postcatheterization hydration in the setting of low normal filling pressures and CKD stage IV. Lonni Hanson, MD [facility]    Medications:    sodium chloride  40 mL/hr at 03/25/24 1100   amiodarone  30 mg/hr (03/25/24 1100)   heparin  1,400 Units/hr (  03/25/24 1100)   norepinephrine  (LEVOPHED ) Adult infusion Stopped (03/24/24 1630)    atorvastatin   80 mg Oral Daily   Chlorhexidine  Gluconate Cloth  6 each Topical Daily   ciprofloxacin   2 drop Left Eye Q4H while awake   clopidogrel   75 mg Oral Daily   ferrous sulfate   325 mg Oral Daily   fluticasone  furoate-vilanterol  1 puff Inhalation Daily   hydroxyurea   500 mg Oral Daily   levothyroxine   50 mcg Oral Q0600   sodium bicarbonate   1,300 mg Oral BID   sodium chloride  flush  3 mL Intravenous Q12H   oxyCODONE  **AND** acetaminophen , acetaminophen , ondansetron  (ZOFRAN ) IV, sodium chloride  flush  Assessment/ Plan:  83 y.o. male with a PMHx of chronic systolic heart failure ejection fraction 30 to 35%, hypertension, hypothyroidism, DVT, paroxysmal atrial fibrillation on Eliquis , bladder cancer status post  cystectomy with chronic urostomy, peripheral vascular disease, who was admitted to [facility] on 03/23/2024 for elective left heart catheterization.    1.  Chronic kidney disease stage IV.  Patient close to his baseline creatinine at time of initial evaluation.  He was exposed to contrast 03/23/2024.   Update: Patient due for angiogram of the left lower extremity tomorrow.  Continue 0.9 normal saline at 40 cc/h.  Optimal IV fluid rate unable to be provided given underlying heart failure.   2.  Coronary artery disease/chronic systolic heart failure.  Patient underwent cardiac catheterization on 03/23/2024.  He had 70% stenosis in the left main coronary artery.  Currently on low rate IV fluids given underlying heart failure in light of upcoming left lower extremity angiogram.   3.  Secondary hyperparathyroidism.  Monitor bone metabolism parameters over the course of hospitalization.   4.  Peripheral vascular disease.  Patient due for angiogram of the left lower extremity early next week.  Starting the patient on gentle hydration given known chronic kidney disease.     LOS: 1 Jakaden Ouzts 12/14/202512:35 PM

## 2024-03-26 ENCOUNTER — Encounter: Payer: Self-pay | Admitting: Internal Medicine

## 2024-03-26 ENCOUNTER — Encounter: Admission: RE | Payer: Self-pay | Attending: Internal Medicine

## 2024-03-26 DIAGNOSIS — I70245 Atherosclerosis of native arteries of left leg with ulceration of other part of foot: Secondary | ICD-10-CM

## 2024-03-26 DIAGNOSIS — L97529 Non-pressure chronic ulcer of other part of left foot with unspecified severity: Secondary | ICD-10-CM

## 2024-03-26 DIAGNOSIS — Z9889 Other specified postprocedural states: Secondary | ICD-10-CM

## 2024-03-26 DIAGNOSIS — I5022 Chronic systolic (congestive) heart failure: Secondary | ICD-10-CM | POA: Diagnosis not present

## 2024-03-26 HISTORY — PX: LOWER EXTREMITY ANGIOGRAPHY: CATH118251

## 2024-03-26 LAB — BASIC METABOLIC PANEL WITH GFR
Anion gap: 14 (ref 5–15)
BUN: 38 mg/dL — ABNORMAL HIGH (ref 8–23)
CO2: 18 mmol/L — ABNORMAL LOW (ref 22–32)
Calcium: 8 mg/dL — ABNORMAL LOW (ref 8.9–10.3)
Chloride: 106 mmol/L (ref 98–111)
Creatinine, Ser: 2.27 mg/dL — ABNORMAL HIGH (ref 0.61–1.24)
GFR, Estimated: 28 mL/min — ABNORMAL LOW (ref >60)
Glucose, Bld: 100 mg/dL — ABNORMAL HIGH (ref 70–99)
Potassium: 4.3 mmol/L (ref 3.5–5.1)
Sodium: 138 mmol/L (ref 135–145)

## 2024-03-26 LAB — CBC
HCT: 40.5 % (ref 39.0–52.0)
Hemoglobin: 12.4 g/dL — ABNORMAL LOW (ref 13.0–17.0)
MCH: 25.6 pg — ABNORMAL LOW (ref 26.0–34.0)
MCHC: 30.6 g/dL (ref 30.0–36.0)
MCV: 83.7 fL (ref 80.0–100.0)
Platelets: 538 K/uL — ABNORMAL HIGH (ref 150–400)
RBC: 4.84 MIL/uL (ref 4.22–5.81)
RDW: 23.3 % — ABNORMAL HIGH (ref 11.5–15.5)
WBC: 22.3 K/uL — ABNORMAL HIGH (ref 4.0–10.5)
nRBC: 0 % (ref 0.0–0.2)

## 2024-03-26 LAB — GLUCOSE, CAPILLARY
Glucose-Capillary: 110 mg/dL — ABNORMAL HIGH (ref 70–99)
Glucose-Capillary: 112 mg/dL — ABNORMAL HIGH (ref 70–99)

## 2024-03-26 LAB — HEPARIN LEVEL (UNFRACTIONATED)
Heparin Unfractionated: 0.19 [IU]/mL — ABNORMAL LOW (ref 0.30–0.70)
Heparin Unfractionated: 0.61 [IU]/mL (ref 0.30–0.70)
Heparin Unfractionated: 0.26 [IU]/mL — ABNORMAL LOW (ref 0.30–0.70)

## 2024-03-26 LAB — MAGNESIUM: Magnesium: 1.9 mg/dL (ref 1.7–2.4)

## 2024-03-26 MED ORDER — MIDAZOLAM HCL (PF) 2 MG/2ML IJ SOLN
INTRAMUSCULAR | Status: DC | PRN
  Administered 2024-03-26: 16:00:00 1 mg via INTRAVENOUS

## 2024-03-26 MED ORDER — LIDOCAINE-EPINEPHRINE (PF) 1 %-1:200000 IJ SOLN
INTRAMUSCULAR | Status: DC | PRN
  Administered 2024-03-26: 15:00:00 10 mL

## 2024-03-26 MED ORDER — HEPARIN (PORCINE) IN NACL 2000-0.9 UNIT/L-% IV SOLN
INTRAVENOUS | Status: DC | PRN
  Administered 2024-03-26: 15:00:00 1000 mL

## 2024-03-26 MED ORDER — FAMOTIDINE 20 MG PO TABS: 40.0000 mg | ORAL_TABLET | Freq: Once | ORAL | Status: DC | PRN

## 2024-03-26 MED ORDER — HYDROMORPHONE HCL 1 MG/ML IJ SOLN: 1.0000 mg | Freq: Once | INTRAMUSCULAR | Status: DC

## 2024-03-26 MED ORDER — FENTANYL CITRATE (PF) 100 MCG/2ML IJ SOLN
INTRAMUSCULAR | Status: DC | PRN
  Administered 2024-03-26: 16:00:00 25 ug via INTRAVENOUS

## 2024-03-26 MED ORDER — MIDAZOLAM HCL 2 MG/ML PO SYRP
8.0000 mg | ORAL_SOLUTION | Freq: Once | ORAL | Status: DC | PRN
  Filled 2024-03-26: qty 5

## 2024-03-26 MED ORDER — SODIUM CHLORIDE 0.9 % IV SOLN: INTRAVENOUS | Status: DC

## 2024-03-26 MED ORDER — ONDANSETRON HCL 4 MG/2ML IJ SOLN
4.0000 mg | INTRAMUSCULAR | Status: AC
  Administered 2024-03-26: 15:00:00 4 mg via INTRAVENOUS

## 2024-03-26 MED ORDER — CEFAZOLIN SODIUM-DEXTROSE 2-4 GM/100ML-% IV SOLN
INTRAVENOUS | Status: AC
  Filled 2024-03-26: qty 100

## 2024-03-26 MED ORDER — METHYLPREDNISOLONE SODIUM SUCC 125 MG IJ SOLR: 125.0000 mg | Freq: Once | INTRAMUSCULAR | Status: DC | PRN

## 2024-03-26 MED ORDER — FENTANYL CITRATE (PF) 100 MCG/2ML IJ SOLN
INTRAMUSCULAR | Status: AC
  Filled 2024-03-26: qty 2

## 2024-03-26 MED ORDER — CEFAZOLIN SODIUM-DEXTROSE 1-4 GM/50ML-% IV SOLN
INTRAVENOUS | Status: AC
  Filled 2024-03-26: qty 50

## 2024-03-26 MED ORDER — ONDANSETRON HCL 4 MG/2ML IJ SOLN
INTRAMUSCULAR | Status: AC
  Filled 2024-03-26: qty 2

## 2024-03-26 MED ORDER — MIDAZOLAM HCL 2 MG/2ML IJ SOLN
INTRAMUSCULAR | Status: AC
  Filled 2024-03-26: qty 2

## 2024-03-26 MED ORDER — HEPARIN BOLUS VIA INFUSION
1900.0000 [IU] | Freq: Once | INTRAVENOUS | Status: AC
  Administered 2024-03-26: 06:00:00 1900 [IU] via INTRAVENOUS
  Filled 2024-03-26: qty 1900

## 2024-03-26 MED ORDER — DIPHENHYDRAMINE HCL 50 MG/ML IJ SOLN: 50.0000 mg | Freq: Once | INTRAMUSCULAR | Status: DC | PRN

## 2024-03-26 MED ORDER — IODIXANOL 320 MG/ML IV SOLN
INTRAVENOUS | Status: DC | PRN
  Administered 2024-03-26: 16:00:00 35 mL via INTRA_ARTERIAL

## 2024-03-26 MED ORDER — HEPARIN SODIUM (PORCINE) 1000 UNIT/ML IJ SOLN
INTRAMUSCULAR | Status: AC
  Filled 2024-03-26: qty 10

## 2024-03-26 MED ORDER — CEFAZOLIN SODIUM-DEXTROSE 1-4 GM/50ML-% IV SOLN
1.0000 g | INTRAVENOUS | Status: AC
  Administered 2024-03-26: 15:00:00 1 g via INTRAVENOUS

## 2024-03-26 MED ORDER — CEFAZOLIN SODIUM-DEXTROSE 2-4 GM/100ML-% IV SOLN: 2.0000 g | INTRAVENOUS | Status: DC

## 2024-03-26 NOTE — Progress Notes (Signed)
 Rounding Note   Patient Name: Jerry Mcpherson Date of Encounter: 03/26/2024  [facility] Cardiologist: None   Subjective Intermittent atrial fibrillation on telemetry with conversion to sinus rhythm this morning around 6:20 AM.  Maintaining sinus rhythm.  Patient reports significant nausea this morning without vomiting.  No chest pain, shortness of breath, or palpitations. Scheduled for left lower extremity angiogram today.  Scheduled Meds:  atorvastatin   80 mg Oral Daily   Chlorhexidine  Gluconate Cloth  6 each Topical Daily   ciprofloxacin   2 drop Left Eye Q4H while awake   clopidogrel   75 mg Oral Daily   ferrous sulfate   325 mg Oral Daily   fluticasone  furoate-vilanterol  1 puff Inhalation Daily   hydroxyurea   500 mg Oral Daily   levothyroxine   50 mcg Oral Q0600   sodium bicarbonate   1,300 mg Oral BID   sodium chloride  flush  3 mL Intravenous Q12H   Continuous Infusions:  sodium chloride  40 mL/hr at 03/26/24 0212   amiodarone  30 mg/hr (03/26/24 0104)   heparin  1,750 Units/hr (03/26/24 1022)   PRN Meds: oxyCODONE  **AND** acetaminophen , acetaminophen , ondansetron  (ZOFRAN ) IV, sodium chloride  flush   Vital Signs  Vitals:   03/25/24 2015 03/25/24 2345 03/26/24 0355 03/26/24 0832  BP: 115/82 107/73 110/72 126/74  Pulse: 86 94 96 68  Resp: (!) 25 20 18 17   Temp: 98.4 F (36.9 C) 98.2 F (36.8 C) 98.3 F (36.8 C) 98.2 F (36.8 C)  TempSrc: Oral Oral Oral   SpO2: 98% 97% 97% 97%  Weight:      Height:        Intake/Output Summary (Last 24 hours) at 03/26/2024 1220 Last data filed at 03/26/2024 1146 Gross per 24 hour  Intake 2158.9 ml  Output 2650 ml  Net -491.1 ml      03/24/2024    2:00 PM 03/23/2024    9:09 AM 03/15/2024   10:47 AM  Last 3 Weights  Weight (lbs) 141 lb 5 oz 144 lb 14.4 oz 143 lb  Weight (kg) 64.1 kg 65.726 kg 64.864 kg      Telemetry Atrial fibrillation with conversion to sinus rhythm around 6:20 AM.  Now maintaining sinus  rhythm with PACs and PVCs.- Personally Reviewed  Physical Exam  GEN: No acute distress.   Neck: No JVD Cardiac: RRR, II/VI systolic murmur, no rubs or gallops.  Respiratory: Diminished at the bases bilaterally GI: Soft, nontender, non-distended  MS: No edema; No deformity. Neuro:  Nonfocal  Psych: Normal affect   Labs High Sensitivity Troponin:  No results for input(s): TROPONINIHS in the last 720 hours. No results for input(s): TRNPT in the last 720 hours.     Chemistry Recent Labs  Lab 03/24/24 0346 03/24/24 1431 03/25/24 0307 03/26/24 0356  NA 140  --  138 138  K 4.5 4.7 4.2 4.3  CL 109  --  107 106  CO2 20*  --  18* 18*  GLUCOSE 99  --  111* 100*  BUN 45*  --  45* 38*  CREATININE 2.26*  --  2.37* 2.27*  CALCIUM  8.8*  --  7.8* 8.0*  MG  --  2.0 1.9 1.9  GFRNONAA 28*  --  26* 28*  ANIONGAP 11  --  12 14    Lipids No results for input(s): CHOL, TRIG, HDL, LABVLDL, LDLCALC, CHOLHDL in the last 168 hours.  Hematology Recent Labs  Lab 03/24/24 0346 03/25/24 0307 03/26/24 0356  WBC 24.5* 23.4* 22.3*  RBC 5.39 4.81  4.84  HGB 13.6 12.0* 12.4*  HCT 45.9 40.0 40.5  MCV 85.2 83.2 83.7  MCH 25.2* 24.9* 25.6*  MCHC 29.6* 30.0 30.6  RDW 23.6* 23.3* 23.3*  PLT 556* 472* 538*   Thyroid   Recent Labs  Lab 03/24/24 1405  TSH 9.610*    BNPNo results for input(s): BNP, PROBNP in the last 168 hours.  DDimer No results for input(s): DDIMER in the last 168 hours.   Radiology  No results found.  Cardiac Studies  R/LHC 03/23/2024:   Ost LM to Mid LM lesion is 70% stenosed.   Mid RCA to Dist RCA lesion is 35% stenosed.   LV end diastolic pressure is normal.   There is moderate aortic valve stenosis.   Conclusions: Severe left main coronary artery disease with 70% proximal stenosis.  Otherwise, mild-moderate coronary artery disease. Normal left and right heart filling pressures. Moderately reduced Fick cardiac output/index (CO 3.1 L/min, CI 1.8  L/min/m). Moderate aortic valve stenosis (peak-to-peak gradient 22 mmHg).   Recommendations: Admit for optimization of heart failure and further evaluation of suspected critical limb ischemia involving the left foot and worsening leukocytosis. May need to consider transfer to [facility] for consideration of LMCA stenting, though given the patient's comorbidities, palliative medical therapy will also need to be considered.  I do not think he is a candidate for CABG. Gentle postcatheterization hydration in the setting of low normal filling pressures and CKD stage IV. __________   2D echo 02/03/2024: 1. Left ventricular ejection fraction, by estimation, is 30 to 35%. The  left ventricle has moderately decreased function. Left ventricular  endocardial border not optimally defined to evaluate regional wall motion.  There is mild concentric left  ventricular hypertrophy. Left ventricular diastolic parameters are  consistent with Grade I diastolic dysfunction (impaired relaxation). The  average left ventricular global longitudinal strain is -8.4 %. The global  longitudinal strain is abnormal.   2. Right ventricular systolic function is normal. The right ventricular  size is normal.   3. Mild to moderate mitral valve regurgitation. No evidence of mitral  stenosis.   4. There is severe calcifcation of the aortic valve. Aortic valve  regurgitation is trivial. Moderate low-flow, low-gradient stenosis. Aortic  valve mean gradient measures 11.0 mmHg. Aortic valve Vmax measures 2.29  m/s. AVA 1.49, DVI 0.36.   Comparison(s): A prior study was performed on 12/17/2023. No significant  change from prior study.   Patient Profile   83 y.o. 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  Assessment & Plan   Atrial fibrillation/flutter with RVR - Briefly required pressors while in flutter - Converted to sinus rhythm on 12/13 with IV amiodarone  - Noted to have recurrence of atrial fibrillation overnight with conversion back to sinus rhythm this morning 12/15 around 6:20 AM - CHA2DS2-VASc 7, continue IV heparin  - Continue IV amiodarone   CAD with left main stenosis - Originally presenting  for outpatient cardiac catheterization 12/12 which revealed severe left main coronary artery disease with 70% proximal stenosis - Will need to consider transfer to [facility] for consideration of left main stenting following optimization of comorbidities - Not felt to be a good candidate for CABG - Denies chest pain - Continue heparin , clopidogrel , and statin therapy  Chronic systolic and diastolic heart failure - EF 30 to 35% with CAD detailed above.  Normal right and left heart filling pressures on cath. - Cardiac index is 1.8 - Appears relatively euvolemic on exam.  Improvement in dyspnea. - Prior angioedema with ARB.  Continue to avoid ACE I, ARB, and ARNI - GDMT limited by hypotension and renal dysfunction  Aortic stenosis - Echo 12/2023 with moderate to severe aortic stenosis, mean gradient 19.8 mmHg and valve area 1.39 cm - Moderate by cath on 12/12 with peak to peak gradient 22 mmHg  PAD - Nonhealing ulceration of the left foot - Evaluated by vascular surgery with plans for possible lower extremity angiography  CKD stage IV - Creatinine overall stable at  2.27 - Will need to monitor volume status closely with IV fluids - Nephrology following   For questions or updates, please contact [facility] Please consult [email or web address] for contact info under       Signed, Lesley LITTIE Maffucci, PA-C  03/26/2024, 12:20 PM

## 2024-03-26 NOTE — Progress Notes (Signed)
 PHARMACY - ANTICOAGULATION CONSULT NOTE  Pharmacy Consult for Heparin  Indication: chest pain/ACS  Allergies[1]  Patient Measurements: Height: 5' 7 (170.2 cm) Weight: 64.1 kg (141 lb 5 oz) IBW/kg (Calculated) : 66.1 HEPARIN  DW (KG): 64.1  Labs: Recent Labs    03/23/24 1654 03/24/24 0346 03/24/24 0815 03/25/24 0307 03/25/24 0758 03/25/24 1853 03/26/24 0356  HGB 16.0 13.6  --  12.0*  --   --  12.4*  HCT 52.3* 45.9  --  40.0  --   --  40.5  PLT 579* 556*  --  472*  --   --  538*  APTT 45*  --   --   --   --   --   --   LABPROT 15.6*  --   --   --   --   --   --   INR 1.2  --   --   --   --   --   --   HEPARINUNFRC  --   --    < >  --  <0.10* 0.28* 0.19*  CREATININE  --  2.26*  --  2.37*  --   --  2.27*   < > = values in this interval not displayed.    Estimated Creatinine Clearance: 21.6 mL/min (A) (by C-G formula based on SCr of 2.27 mg/dL (H)).   Medical History: Past Medical History:  Diagnosis Date   Acute deep vein thrombosis (DVT) of right femoral vein (HCC) 10/28/2020   US  10/24/2020 [facility] Acute right lower extremity DVT extending from the common femoral vein to  the popliteal vein.     Bladder cancer (HCC)    Genital herpes    Hyperglycemia    Hypertension    Squamous cell carcinoma    Assessment: Patient is a 83 yo M presenting for right/left heart catheterization ad coronary angiography. PMH significant for essential hypertension, recurrent vertigo, polycythemia vera, hypothyroidism, history of DVTs, atrial fibrillation and flutter, history of bladder cancer peripheral arterial disease, and UTI. Pharmacy is consulted for Heparin  dosing and monitoring s/p heart cath and angiography. Per cardiology MD, plan is to start Heparin  infusion 8 hours post sheath removal. No initial bolus needed per MD. Sheath removed at ~16:20.   Baseline CBC with normal Hgb and elevated platelets, INR 1.2, and aPTT 45s  Med rec states patient is taking Eliquis  5 mg daily. Dispense  report shows Eliquis  2.5 mg BID last filled 12/20/23 for 30d supply.   Date Time HL Rate/Comment  1213  0815  < 0.1 Subthera; 800 un/hr 1213 2001 < 0.1  Subthera; 1000 un/hr 1214 0758 < 0.1 Subthera; 1150 un/hr  1214 1853 0.28 Subtherapeutic 1215 0356 0.19 Subtherapeutic  Goal of Therapy:  Heparin  level 0.3-0.7 units/ml Monitor platelets by anticoagulation protocol: Yes   Plan:  --Heparin  level remains subtherapeutic --Heparin  1900 unit IV bolus and increase heparin  infusion rate to 1750 units/hr --Re-check HL 8 hours from rate change --Daily CBC per protocol while on IV heparin   Rankin CANDIE Dills, PharmD, [facility] 03/26/2024 5:39 AM       [1]  Allergies Allergen Reactions   Losartan Potassium Swelling    angioedema Other reaction(s): Angioedema   Shellfish Allergy Swelling and Rash    Rash, itching, swelling   Currently eats SHELLFISH without symptoms   Shellfish Protein-Containing Drug Products Dermatitis and Swelling    Rash, itching, swelling    Rash, itching, swelling   Currently eats SHELLFISH without symptoms   Doxycycline  Other (See Comments)  Blisters on hand   Ultram  [Tramadol Hcl] Nausea And Vomiting   Celecoxib Itching, Swelling, Rash and Other (See Comments)    Cannot take when eating shellfish   Codeine Palpitations    Heart racing   Cyclobenzaprine Other (See Comments)    Musculoskeletal therapy agents cause Excessive sedation Musculoskeletal therapy agents cause excessive sedation   Tramadol Hcl Nausea And Vomiting

## 2024-03-26 NOTE — Progress Notes (Signed)
 PHARMACY - ANTICOAGULATION CONSULT NOTE  Pharmacy Consult for Heparin  Indication: chest pain/ACS, aflutter, PVD.   Allergies[1]  Patient Measurements: Height: 5' 7 (170.2 cm) Weight: 64.1 kg (141 lb 5 oz) IBW/kg (Calculated) : 66.1 HEPARIN  DW (KG): 64.1  Labs: Recent Labs    03/23/24 1654 03/24/24 0346 03/24/24 0815 03/25/24 0307 03/25/24 0758 03/25/24 1853 03/26/24 0356 03/26/24 1357  HGB 16.0 13.6  --  12.0*  --   --  12.4*  --   HCT 52.3* 45.9  --  40.0  --   --  40.5  --   PLT 579* 556*  --  472*  --   --  538*  --   APTT 45*  --   --   --   --   --   --   --   LABPROT 15.6*  --   --   --   --   --   --   --   INR 1.2  --   --   --   --   --   --   --   HEPARINUNFRC  --   --    < >  --    < > 0.28* 0.19* 0.61  CREATININE  --  2.26*  --  2.37*  --   --  2.27*  --    < > = values in this interval not displayed.    Estimated Creatinine Clearance: 21.6 mL/min (A) (by C-G formula based on SCr of 2.27 mg/dL (H)).   Medical History: Past Medical History:  Diagnosis Date   Acute deep vein thrombosis (DVT) of right femoral vein (HCC) 10/28/2020   US  10/24/2020 [facility] Acute right lower extremity DVT extending from the common femoral vein to  the popliteal vein.     Bladder cancer (HCC)    Genital herpes    Hyperglycemia    Hypertension    Squamous cell carcinoma    Assessment: Patient is a 83 yo M presenting for right/left heart catheterization ad coronary angiography. PMH significant for essential hypertension, recurrent vertigo, polycythemia vera, hypothyroidism, history of DVTs, atrial fibrillation and flutter, history of bladder cancer peripheral arterial disease, and UTI. Pharmacy is consulted for Heparin  dosing and monitoring s/p heart cath and angiography. Per cardiology MD, plan is to start Heparin  infusion 8 hours post sheath removal. No initial bolus needed per MD. Sheath removed at ~16:20.   Med rec states patient is taking Eliquis  5 mg daily. Dispense report  shows Eliquis  2.5 mg BID last filled 12/20/23 for 30d supply.   Date Time HL Rate/Comment  1213  0815  < 0.1 Subthera; 800 un/hr 1213 2001 < 0.1  Subthera; 1000 un/hr 1214 0758 < 0.1 Subthera; 1150 un/hr  1214 1853 0.28 Subtherapeutic 1215 0356 0.19 Subtherapeutic 1215 1357 0.61   Goal of Therapy:  Heparin  level 0.3-0.7 units/ml Monitor platelets by anticoagulation protocol: Yes   Plan:  Heparin  level is therapeutic. Will continue heparin  infusion at 1750 unit/hr. Recheck heparin  level in 8 hours. CBC daily while on heparin .   Cathaleen Blanch, PharmD, 03/26/2024 3:11 PM        [1]  Allergies Allergen Reactions   Losartan Potassium Swelling    angioedema Other reaction(s): Angioedema   Shellfish Allergy Swelling and Rash    Rash, itching, swelling   Currently eats SHELLFISH without symptoms   Shellfish Protein-Containing Drug Products Dermatitis and Swelling    Rash, itching, swelling    Rash, itching, swelling   Currently  eats SHELLFISH without symptoms   Doxycycline  Other (See Comments)    Blisters on hand   Ultram  [Tramadol Hcl] Nausea And Vomiting   Celecoxib Itching, Swelling, Rash and Other (See Comments)    Cannot take when eating shellfish   Codeine Palpitations    Heart racing   Cyclobenzaprine Other (See Comments)    Musculoskeletal therapy agents cause Excessive sedation Musculoskeletal therapy agents cause excessive sedation   Tramadol Hcl Nausea And Vomiting

## 2024-03-26 NOTE — Progress Notes (Incomplete)
 Pt. Med. With additional dose of Zofran  4 mg slow IVP sec. To pt. C/o nausea still lingering after 1230 dose.

## 2024-03-26 NOTE — Plan of Care (Signed)
   Problem: Education: Goal: Understanding of CV disease, CV risk reduction, and recovery process will improve Outcome: Progressing

## 2024-03-26 NOTE — Progress Notes (Signed)
 Dr. Arida stopped by to see pt. Pt. Remains sleepy at present. MD states he will speak with him more extensively tomorrow.

## 2024-03-26 NOTE — Op Note (Signed)
 [facility]  Percutaneous Study/Intervention Procedural Note   Date of Surgery: 03/23/2024 - 03/26/2024  Surgeon(s):Karolina Zamor    Assistants:none  Pre-operative Diagnosis: PAD with ulceration left lower extremity  Post-operative diagnosis:  Same  Procedure(s) Performed:             1.  Ultrasound guidance for vascular access right femoral artery             2.  Catheter placement into left SFA from right femoral approach             3.  Aortogram and selective left lower extremity angiogram             4.  StarClose closure device right femoral artery  EBL: 3 cc  Contrast: 35 cc  Fluoro Time: 1.1 minutes  Moderate Conscious Sedation Time: approximately 20 minutes using 1 mg of Versed  and 25 mcg of Fentanyl               Indications:  Patient is an 83 y.o.male with a history of peripheral arterial disease with multiple previous interventions now with nonhealing ulcerations of the left foot and no palpable pedal pulses. The patient is brought in for angiography for further evaluation and potential treatment.  Due to the limb threatening nature of the situation, angiogram was performed for attempted limb salvage. The patient is aware that if the procedure fails, amputation would be expected.  The patient also understands that even with successful revascularization, amputation may still be required due to the severity of the situation.  Risks and benefits are discussed and informed consent is obtained.   Procedure:  The patient was identified and appropriate procedural time out was performed.  The patient was then placed supine on the table and prepped and draped in the usual sterile fashion. Moderate conscious sedation was administered during a face to face encounter with the patient throughout the procedure with my supervision of the RN administering medicines and monitoring the patient's vital signs, pulse oximetry, telemetry and mental status throughout from the  start of the procedure until the patient was taken to the recovery room. Ultrasound was used to evaluate the right common femoral artery.  It was patent .  A digital ultrasound image was acquired.  A Seldinger needle was used to access the right common femoral artery under direct ultrasound guidance and a permanent image was performed.  A 0.035 J wire was advanced without resistance and a 5Fr sheath was placed.  Pigtail catheter was placed into the aorta and an AP aortogram was performed. This demonstrated normal renal arteries and normal aorta and iliac segments without significant stenosis. I then crossed the aortic bifurcation and advanced to the left femoral head.  After the initial picture, the pigtail catheter was then advanced down to the mid to distal SFA to opacify distally which was necessary due to his extremely slow perfusion time from his reduced EF.  Selective left lower extremity angiogram was then performed. This demonstrated fairly large and patent left common femoral artery, profunda femoris artery, and superficial femoral and popliteal arteries with very mild disease and nothing more than 30%.  There was then a large peroneal artery which was patent continuous to its normal course at the ankle with collaterals that slowly filled the foot.  There was not a focal stenosis within the peroneal artery.  The anterior tibial and posterior tibial arteries occluded after the proximal takeoff without distal reconstitution for revascularization.  At this point, there were  no options for revascularization to improve his lower extremity perfusion with all the disease essentially being small vessel and reduced ejection fraction limiting blood flow distally.  There were no focal stenosis requiring treatment.  I elected to terminate the procedure. The sheath was removed and StarClose closure device was deployed in the right femoral artery with excellent hemostatic result. The patient was taken to the recovery  room in stable condition having tolerated the procedure well.  Findings:               Aortogram:  This demonstrated normal renal arteries and normal aorta and iliac segments without significant stenosis.             Left Lower Extremity:  This demonstrated normal renal arteries and normal aorta and iliac segments without significant stenosis. I then crossed the aortic bifurcation and advanced to the left femoral head.  After the initial picture, the pigtail catheter was then advanced down to the mid to distal SFA to opacify distally which was necessary due to his extremely slow perfusion time from his reduced EF.  Selective left lower extremity angiogram was then performed. This demonstrated fairly large and patent left common femoral artery, profunda femoris artery, and superficial femoral and popliteal arteries with very mild disease and nothing more than 30%.  There was then a large peroneal artery which was patent continuous to its normal course at the ankle with collaterals that slowly filled the foot.  There was not a focal stenosis within the peroneal artery.  The anterior tibial and posterior tibial arteries occluded after the proximal takeoff without distal reconstitution for revascularization.    Disposition: Patient was taken to the recovery room in stable condition having tolerated the procedure well.  Complications: None  Selinda Gu 03/26/2024 3:49 PM   This note was created with Dragon Medical transcription system. Any errors in dictation are purely unintentional.

## 2024-03-26 NOTE — Plan of Care (Signed)
°  Problem: Education: Goal: Understanding of CV disease, CV risk reduction, and recovery process will improve 03/26/2024 1857 by Lynnette Cena CROME, RN Outcome: Progressing 03/26/2024 1806 by Lynnette Cena CROME, RN Outcome: Progressing Goal: Individualized Educational Video(s) Outcome: Progressing

## 2024-03-26 NOTE — Plan of Care (Signed)
  Problem: Activity: Goal: Risk for activity intolerance will decrease Outcome: Progressing   Problem: Nutrition: Goal: Adequate nutrition will be maintained Outcome: Progressing   Problem: Elimination: Goal: Will not experience complications related to urinary retention Outcome: Progressing   Problem: Pain Managment: Goal: General experience of comfort will improve and/or be controlled Outcome: Progressing   Problem: Safety: Goal: Ability to remain free from injury will improve Outcome: Progressing   Problem: Skin Integrity: Goal: Risk for impaired skin integrity will decrease Outcome: Progressing

## 2024-03-26 NOTE — Interval H&P Note (Signed)
 History and Physical Interval Note:  03/26/2024 2:16 PM  Jerry Mcpherson  has presented today for surgery, with the diagnosis of PAD with ulceration LEFT foot.  The various methods of treatment have been discussed with the patient and family. After consideration of risks, benefits and other options for treatment, the patient has consented to  Procedures: Lower Extremity Angiography (Left) as a surgical intervention.  The patient's history has been reviewed, patient examined, no change in status, stable for surgery.  I have reviewed the patient's chart and labs.  Questions were answered to the patient's satisfaction.     Cassandra Harbold

## 2024-03-26 NOTE — Progress Notes (Signed)
 [facility]  ROUNDING NOTE   Subjective:   Renal function slightly improved. Creatinine down to 2.2. Due for angiogram today.  Lab Results  Component Value Date   CREATININE 2.27 (H) 03/26/2024   CREATININE 2.37 (H) 03/25/2024   CREATININE 2.26 (H) 03/24/2024   12/14 0701 - 12/15 0700 In: 2083.1 [P.O.:360; I.V.:1723.1] Out: 2550 [Urine:2550]   Objective:  Vital signs in last 24 hours:  Temp:  [97.4 F (36.3 C)-98.8 F (37.1 C)] 97.4 F (36.3 C) (12/15 1600) Pulse Rate:  [67-96] 74 (12/15 1600) Resp:  [0-25] 21 (12/15 1600) BP: (107-145)/(56-82) 120/58 (12/15 1600) SpO2:  [77 %-99 %] 97 % (12/15 1600) Weight:  [64.1 kg] 64.1 kg (12/15 1422)  Weight change:  Filed Weights   03/23/24 0909 03/24/24 1400 03/26/24 1422  Weight: 65.7 kg 64.1 kg 64.1 kg    Intake/Output: I/O last 3 completed shifts: In: 3005.6 [P.O.:360; I.V.:2645.6] Out: 3300 [Urine:3300]   Intake/Output this shift:  Total I/O In: 366.4 [I.V.:366.4] Out: 650 [Urine:650]  Physical Exam: General: No acute distress  Head: Normocephalic, atraumatic. Moist oral mucosal membranes  Neck: Supple  Lungs:  Clear to auscultation, normal effort  Heart: S1S2 irregular  Abdomen:  Soft, nontender, bowel sounds present, urostomy present  Extremities: No peripheral edema.  Neurologic: Awake, alert, following commands  Skin: No acute rash  Access: No hemodialysis access    Basic Metabolic Panel: Recent Labs  Lab 03/21/24 1001 03/23/24 1335 03/23/24 1338 03/24/24 0346 03/24/24 1431 03/25/24 0307 03/26/24 0356  NA 141 143 142 140  --  138 138  K 4.9 4.4 4.5 4.5 4.7 4.2 4.3  CL 106  --   --  109  --  107 106  CO2 18*  --   --  20*  --  18* 18*  GLUCOSE 102*  --   --  99  --  111* 100*  BUN 52*  --   --  45*  --  45* 38*  CREATININE 2.43*  --   --  2.26*  --  2.37* 2.27*  CALCIUM  9.0  --   --  8.8*  --  7.8* 8.0*  MG  --   --   --   --  2.0 1.9 1.9    Liver Function Tests: No  results for input(s): AST, ALT, ALKPHOS, BILITOT, PROT, ALBUMIN in the last 168 hours. No results for input(s): LIPASE, AMYLASE in the last 168 hours. No results for input(s): AMMONIA in the last 168 hours.  CBC: Recent Labs  Lab 03/21/24 1001 03/23/24 1335 03/23/24 1338 03/23/24 1654 03/24/24 0346 03/25/24 0307 03/26/24 0356  WBC 26.1*  --   --  23.3* 24.5* 23.4* 22.3*  NEUTROABS  --   --   --  19.1*  --   --   --   HGB 13.4   < > 16.0 16.0 13.6 12.0* 12.4*  HCT 44.9   < > 47.0 52.3* 45.9 40.0 40.5  MCV 85  --   --  83.7 85.2 83.2 83.7  PLT 592*  --   --  579* 556* 472* 538*   < > = values in this interval not displayed.    Cardiac Enzymes: No results for input(s): CKTOTAL, CKMB, CKMBINDEX, TROPONINI in the last 168 hours.  BNP: Invalid input(s): POCBNP  CBG: Recent Labs  Lab 03/24/24 1346 03/26/24 1310  GLUCAP 110* 112*    Microbiology: Results for orders placed or performed during the hospital encounter of 03/23/24  MRSA  Next Gen by PCR, Nasal     Status: None   Collection Time: 03/24/24  1:43 PM   Specimen: Nasal Mucosa; Nasal Swab  Result Value Ref Range Status   MRSA by PCR Next Gen NOT DETECTED NOT DETECTED Final    Comment: (NOTE) The GeneXpert MRSA Assay (FDA approved for NASAL specimens only), is one component of a comprehensive MRSA colonization surveillance program. It is not intended to diagnose MRSA infection nor to guide or monitor treatment for MRSA infections. Test performance is not FDA approved in patients less than 26 years old. Performed at [facility], [address] [place], [place] [address]     Coagulation Studies: Recent Labs    03/23/24 1654  LABPROT 15.6*  INR 1.2    Urinalysis: No results for input(s): COLORURINE, LABSPEC, PHURINE, GLUCOSEU, HGBUR, BILIRUBINUR, KETONESUR, PROTEINUR, UROBILINOGEN, NITRITE, LEUKOCYTESUR in the last 72 hours.  Invalid input(s):  APPERANCEUR     Imaging: PERIPHERAL VASCULAR CATHETERIZATION Result Date: 03/26/2024 See surgical note for result.    Medications:    amiodarone  30 mg/hr (03/26/24 0104)   heparin  1,750 Units/hr (03/26/24 1619)    [MAR Hold] atorvastatin   80 mg Oral Daily   [MAR Hold] Chlorhexidine  Gluconate Cloth  6 each Topical Daily   [MAR Hold] ciprofloxacin   2 drop Left Eye Q4H while awake   [MAR Hold] clopidogrel   75 mg Oral Daily   fentaNYL        [MAR Hold] ferrous sulfate   325 mg Oral Daily   [MAR Hold] fluticasone  furoate-vilanterol  1 puff Inhalation Daily   [MAR Hold] hydroxyurea   500 mg Oral Daily   [MAR Hold] levothyroxine   50 mcg Oral Q0600   midazolam        ondansetron        [MAR Hold] sodium bicarbonate   1,300 mg Oral BID   [MAR Hold] sodium chloride  flush  3 mL Intravenous Q12H   [MAR Hold] oxyCODONE  **AND** [MAR Hold] acetaminophen , [MAR Hold] acetaminophen , fentaNYL , iodixanol , midazolam , ondansetron , [MAR Hold] ondansetron  (ZOFRAN ) IV, [MAR Hold] sodium chloride  flush  Assessment/ Plan:  83 y.o. male with a PMHx of chronic systolic heart failure ejection fraction 30 to 35%, hypertension, hypothyroidism, DVT, paroxysmal atrial fibrillation on Eliquis , bladder cancer status post cystectomy with chronic urostomy, peripheral vascular disease, who was admitted to [facility] on 03/23/2024 for elective left heart catheterization.    1.  Chronic kidney disease stage IV.  Patient close to his baseline creatinine at time of initial evaluation.  He was exposed to contrast 03/23/2024.   Update: Patient due for left lower extremity angiogram today.  He has been on IV fluid hydration.  Creatinine was slightly better today at 2.2.  Monitor renal parameters closely given multiple recent contrast exposures.   2.  Coronary artery disease/chronic systolic heart failure.  Patient underwent cardiac catheterization on 03/23/2024.  He had 70% stenosis in the left main coronary artery.  Has been  receiving IV fluids given contrast exposure today.  3.  Secondary hyperparathyroidism.  Monitor bone metabolism parameters over the course of hospitalization.   4.  Peripheral vascular disease.  Angiogram performed 03/26/2024.  Monitor renal parameters as above.     LOS: 2 Jerry Mcpherson 12/15/20254:29 PM

## 2024-03-26 NOTE — Progress Notes (Signed)
 Dr. Marea came by recovery room to speak with pt. Re: leg angiogram. Pt. Still sleepy and stated o.k. to conversation with MD. No N/V a

## 2024-03-26 NOTE — Progress Notes (Signed)
 Palliative Care Progress Note, Assessment & Plan   Patient Name: Jerry Mcpherson       Date: 03/26/2024 DOB: September 28, 1940  Age: 83 y.o. MRN#: [id number] Attending Physician: Maree Hue, MD Primary Care Physician: [name], MD Admit Date: 03/23/2024  Subjective: Reports sudden onset nausea today with little relief from IV medication. Feels hot, thinks he may have fever. Not eating for possible left leg procedure today. Denies pain. He is concerned that he may not feel well enough to have procedure today.   HPI: 83 y.o. male  with past medical history significant for recurrent auricles, chronic HFrEF with LVEF 30-35%, HTN, hypothyroidism, DVT, PAF on Eliquis , bladder cancer s/p cystectomy w/ chronic urostomy, PVD and polycythemia vera. Patient presented to [facility] for elective RHC/LHC and coronary angiography.    Findings of cardiac catherization 03/23/2024: Severe left main coronary artery disease with 70% proximal stenosis.  Otherwise, mild-moderate CAD. Normal left and right heart filling pressures. Moderately reduced Fick cardiac output/index (CO 3.1 L/min, CI 1.8 L/min/m).   Pt was referred for admission for: Optimization of heart failure and further evaluation of suspected critical limb ischemia involving the left foot and worsening leukocytosis. May need to consider transfer to [facility] for consideration of LMCA stenting, though given the patient's comorbidities, palliative medical therapy will also need to be considered.  I do not think he is a candidate for CABG. Gentle postcatheterization hydration in the setting of low normal filling pressures and CKD stage IV. Vascular was consulted and recommended to undergo angiogram of LLE for further evaluation and potential revascularization with  hope of salvaging limb after assessment concerning for critical L lower limb ischemia.   Pt was admitted for management of A flutter with RVR, L 3rd and 4th chronic toe ulcer, chronic leukocytosis, CKD IV, chronic HFrEF and polycythemia vera.    Palliative team was consulted to assist with goals of care conversations.   Summary of counseling/coordination of care: Extensive chart review completed prior to meeting patient including labs, vital signs, imaging, progress notes, orders, and available advanced directive documents from current and previous encounters.   After reviewing the patient's chart and assessing the patient at bedside, I spoke with patient in regards to symptom management and goals of care.     Latest Ref Rng & Units 03/26/2024    3:56 AM 03/25/2024    3:07 AM 03/24/2024    3:46 AM  CBC  WBC 4.0 - 10.5 K/uL 22.3  23.4  24.5   Hemoglobin 13.0 - 17.0 g/dL 87.5  87.9  86.3   Hematocrit 39.0 - 52.0 % 40.5  40.0  45.9   Platelets 150 - 400 K/uL 538  472  556       Latest Ref Rng & Units 03/26/2024    3:56 AM 03/25/2024    3:07 AM 03/24/2024    2:31 PM  CMP  Glucose 70 - 99 mg/dL 899  888    BUN 8 - 23 mg/dL 38  45    Creatinine 9.38 - 1.24 mg/dL 7.72  7.62    Sodium 864 - 145 mmol/L 138  138    Potassium 3.5 - 5.1 mmol/L 4.3  4.2  4.7   Chloride 98 - 111 mmol/L 106  107    CO2 22 - 32 mmol/L 18  18    Calcium  8.9 - 10.3 mg/dL 8.0  7.8       Ill-appearing, elderly male resting in bed. He is alert and oriented to self, time, location and situation. He is able to participate in conversation. Respirations are even and unlabored. He is in no distress.   Therapeutic silence and active listening provided for patient to share his thoughts and emotions regarding current medical situation.  Emotional support provided.  Physical Exam Vitals reviewed.  Constitutional:      General: He is not in acute distress.    Appearance: He is ill-appearing.  HENT:     Head:  Normocephalic and atraumatic.     Mouth/Throat:     Mouth: Mucous membranes are dry.  Pulmonary:     Effort: Pulmonary effort is normal. No respiratory distress.  Musculoskeletal:     Right lower leg: No edema.     Left lower leg: No edema.  Skin:    General: Skin is warm and dry.  Neurological:     Mental Status: He is alert and oriented to person, place, and time.  Psychiatric:        Mood and Affect: Mood normal.        Behavior: Behavior normal.        Thought Content: Thought content normal.        Judgment: Judgment normal.    Recommendations/Plan: FULL CODE status as previously documented    Continue current supportive interventions Palliative will continue to follow Disposition TBD         Total Time 50 minutes   Time spent includes: Detailed review of medical records (labs, imaging, vital signs), medically appropriate exam (mental status, respiratory, cardiac, skin), discussed with treatment team, counseling and educating patient, family and staff, documenting clinical information, medication management and coordination of care.     Devere Sacks, ELNITA- [facility] Palliative Medicine Team  03/26/2024 11:24 AM  Office [phone]  Pager [phone]

## 2024-03-26 NOTE — Progress Notes (Signed)
 1      PROGRESS NOTE    Jerry Mcpherson  FMW:[id number] DOB: 05-08-1940 DOA: 03/23/2024 PCP: Jerry Nancyann BRAVO, MD   Brief Narrative:  83 y.o. male with past medical history of recurrent auricles, chronic HFrEF with LVEF 30-35%, HTN, hypothyroidism, DVT, PAF on Eliquis , bladder cancer status post cystectomy and chronic urostomy, PVD, who has come in today for elective LHC.   12/12: Vascular surgery, cardiology consultation, right/left heart cath  12/13: Transfer to stepdown, intensivist consult due to persistent hypotension, nephrology and palliative care consult 12/14: transfer to PCU 12/15: Left lower extremity angiogram   Assessment & Plan:   Principal Problem:   Chronic HFrEF (heart failure with reduced ejection fraction) (HCC) Active Problems:   Atrial flutter (HCC)   Shock circulatory (HCC)   Atrial fibrillation with RVR (HCC)   AKI (acute kidney injury)   Pressure injury of skin   Atrial flutter/Fib with RVR - On heparin  drip, needed transfer to [place] for <24 hrs for persistent hypotension - - needed Levophed  for a short period on 12/13 and has been off since then - consider DOAC transition tomorrow - continue IV amiodarone  - converted to NSR and HR/BP stabilized since then. - may switch to PO Amio tomorrow - cardio following  Left 3rd and 4th toe chronic ulcer History of PVD - The ulcer appears to be chronic, likely secondary to PVD as pedal pulses on the left significantly diminished compared to the right side, has been followed by wound care center with weekly dressing change. wound appears to be stable and no signs of infection. - Conservative management - Hold off antibiotics. - vascular surgery-s/p angiogram of the left lower extremity - anterior tibial and posterior tibial arteries occlusion with lots of small vessel dz - no revascularization option - likely DOAC at DC   Chronic leukocytosis - As per oncology's note from earlier this month, chronic leukocytosis  likely secondary to polycythemia vera, for which patient has been taking hydroxyurea . - Patient denied any cough, no urinary symptoms or diarrhea and left fourth toe ulcer appears to be chronic, less likely has a active infection at this point.  Plan to monitor off antibiotics.   Decompensated combined non-anion gap metabolic acidosis and respiratory alkalosis  CKD stage IV Bladder cancer status post resection with chronic urostomy - Euvolemic, creatinine level stable - Nephrology seen, continue IVF   Syncope - Likely secondary to worsening of chronic HFrEF as well as moderate to severe aortic stenosis - followed by cardiology, - conservative management. - He appears to be euvolemic at this point  CAD with left main stenosis seen on cardiac cath this admission Chronic systolic and diastolic heart failure with EF of 30 to 35% Moderate aortic stenosis For now continue heparin  and Plavix , statin   Polycythemia vera - Hemoglobin stable, continue hydroxyurea  - Patient follow-up with hematology.   Hypothyroidism - Continue Synthroid    Goals of care-palliative care seen,  His overall prognosis is poor   DVT prophylaxis: Heparin  drip      Code Status: Full code Family Communication: None at bedside Disposition Plan: Possible discharge in 1-2 days depending on clinical condition, cardiology, nephrology and vascular surgery evaluation   Consultants:  Nephrology Vascular surgery Cardiology Intensivist Palliative care     Subjective:  Was having significant foot pain relieved by pain meds  Objective: Vitals:   03/26/24 1600 03/26/24 1630 03/26/24 1701 03/26/24 1952  BP: (!) 120/58 (!) 112/53 123/69 111/62  Pulse: 74 71 70 72  Resp: (!) 21  18 (!) 21  Temp: (!) 97.4 F (36.3 C) 97.7 F (36.5 C) 97.8 F (36.6 C) 98 F (36.7 C)  TempSrc: Temporal Temporal Oral Oral  SpO2: 97% 97% 98% 99%  Weight:      Height:        Intake/Output Summary (Last 24 hours) at  03/26/2024 2059 Last data filed at 03/26/2024 1456 Gross per 24 hour  Intake 1419.72 ml  Output 2000 ml  Net -580.28 ml   Filed Weights   03/23/24 0909 03/24/24 1400 03/26/24 1422  Weight: 65.7 kg 64.1 kg 64.1 kg    Examination:  General exam: Appears calm and comfortable, elderly and chronically ill/cachectic looking Respiratory system: Clear to auscultation. Respiratory effort normal. Cardiovascular system: S1,S2 normal. No M/R/G Gastrointestinal system: Abdomen is soft, benign Central nervous system: Alert and oriented. No focal neurological deficits. Extremities: Symmetric 5 x 5 power. Skin: Left foot in the dressing for small superficial ulceration between 3rd and 4th toe Psychiatry: Judgement and insight appear normal. Mood & affect appropriate.     Data Reviewed: I have personally reviewed following labs and imaging studies  CBC: Recent Labs  Lab 03/21/24 1001 03/23/24 1335 03/23/24 1338 03/23/24 1654 03/24/24 0346 03/25/24 0307 03/26/24 0356  WBC 26.1*  --   --  23.3* 24.5* 23.4* 22.3*  NEUTROABS  --   --   --  19.1*  --   --   --   HGB 13.4   < > 16.0 16.0 13.6 12.0* 12.4*  HCT 44.9   < > 47.0 52.3* 45.9 40.0 40.5  MCV 85  --   --  83.7 85.2 83.2 83.7  PLT 592*  --   --  579* 556* 472* 538*   < > = values in this interval not displayed.   Basic Metabolic Panel: Recent Labs  Lab 03/21/24 1001 03/23/24 1335 03/23/24 1338 03/24/24 0346 03/24/24 1431 03/25/24 0307 03/26/24 0356  NA 141 143 142 140  --  138 138  K 4.9 4.4 4.5 4.5 4.7 4.2 4.3  CL 106  --   --  109  --  107 106  CO2 18*  --   --  20*  --  18* 18*  GLUCOSE 102*  --   --  99  --  111* 100*  BUN 52*  --   --  45*  --  45* 38*  CREATININE 2.43*  --   --  2.26*  --  2.37* 2.27*  CALCIUM  9.0  --   --  8.8*  --  7.8* 8.0*  MG  --   --   --   --  2.0 1.9 1.9    Coagulation Profile: Recent Labs  Lab 03/23/24 1654  INR 1.2    Sepsis Labs: Recent Labs  Lab 03/23/24 1654   LATICACIDVEN 1.6    Recent Results (from the past 240 hours)  MRSA Next Gen by PCR, Nasal     Status: None   Collection Time: 03/24/24  1:43 PM   Specimen: Nasal Mucosa; Nasal Swab  Result Value Ref Range Status   MRSA by PCR Next Gen NOT DETECTED NOT DETECTED Final    Comment: (NOTE) The GeneXpert MRSA Assay (FDA approved for NASAL specimens only), is one component of a comprehensive MRSA colonization surveillance program. It is not intended to diagnose MRSA infection nor to guide or monitor treatment for MRSA infections. Test performance is not FDA approved in patients less than 2 years  old. Performed at [facility], [address] [place], [place] [address]          Radiology Studies: PERIPHERAL VASCULAR CATHETERIZATION Result Date: 03/26/2024 See surgical note for result.       Scheduled Meds:  atorvastatin   80 mg Oral Daily   Chlorhexidine  Gluconate Cloth  6 each Topical Daily   ciprofloxacin   2 drop Left Eye Q4H while awake   clopidogrel   75 mg Oral Daily   fentaNYL        ferrous sulfate   325 mg Oral Daily   fluticasone  furoate-vilanterol  1 puff Inhalation Daily   hydroxyurea   500 mg Oral Daily   levothyroxine   50 mcg Oral Q0600   midazolam        ondansetron        sodium bicarbonate   1,300 mg Oral BID   sodium chloride  flush  3 mL Intravenous Q12H   Continuous Infusions:  amiodarone  30 mg/hr (03/26/24 0104)   heparin  1,750 Units/hr (03/26/24 1619)     LOS: 2 days    Time spent : 35 minutes    Marena Witts Maree, MD [facility] Pager [phone]  If 7PM-7AM, please contact night-coverage  03/26/2024, 8:59 PM

## 2024-03-26 NOTE — Care Management Important Message (Signed)
 Important Message  Patient Details  Name: Jerry Mcpherson MRN: [id number] Date of Birth: [birth year]   Important Message Given:  Yes - Medicare IM     Jerry Mcpherson 03/26/2024, 2:57 PM

## 2024-03-27 ENCOUNTER — Encounter: Admitting: Internal Medicine

## 2024-03-27 DIAGNOSIS — Z7901 Long term (current) use of anticoagulants: Secondary | ICD-10-CM

## 2024-03-27 DIAGNOSIS — L97929 Non-pressure chronic ulcer of unspecified part of left lower leg with unspecified severity: Secondary | ICD-10-CM

## 2024-03-27 DIAGNOSIS — I70249 Atherosclerosis of native arteries of left leg with ulceration of unspecified site: Secondary | ICD-10-CM

## 2024-03-27 DIAGNOSIS — I110 Hypertensive heart disease with heart failure: Secondary | ICD-10-CM

## 2024-03-27 DIAGNOSIS — I501 Left ventricular failure: Secondary | ICD-10-CM

## 2024-03-27 LAB — CBC
HCT: 42.6 % (ref 39.0–52.0)
Hemoglobin: 12.9 g/dL — ABNORMAL LOW (ref 13.0–17.0)
MCH: 25.3 pg — ABNORMAL LOW (ref 26.0–34.0)
MCHC: 30.3 g/dL (ref 30.0–36.0)
MCV: 83.7 fL (ref 80.0–100.0)
Platelets: 557 K/uL — ABNORMAL HIGH (ref 150–400)
RBC: 5.09 MIL/uL (ref 4.22–5.81)
RDW: 24.2 % — ABNORMAL HIGH (ref 11.5–15.5)
WBC: 22.4 K/uL — ABNORMAL HIGH (ref 4.0–10.5)
nRBC: 0 % (ref 0.0–0.2)

## 2024-03-27 LAB — BASIC METABOLIC PANEL WITH GFR
Anion gap: 14 (ref 5–15)
BUN: 33 mg/dL — ABNORMAL HIGH (ref 8–23)
CO2: 19 mmol/L — ABNORMAL LOW (ref 22–32)
Calcium: 8.2 mg/dL — ABNORMAL LOW (ref 8.9–10.3)
Chloride: 104 mmol/L (ref 98–111)
Creatinine, Ser: 2.34 mg/dL — ABNORMAL HIGH (ref 0.61–1.24)
GFR, Estimated: 27 mL/min — ABNORMAL LOW (ref >60)
Glucose, Bld: 106 mg/dL — ABNORMAL HIGH (ref 70–99)
Potassium: 4.5 mmol/L (ref 3.5–5.1)
Sodium: 136 mmol/L (ref 135–145)

## 2024-03-27 LAB — HEPARIN LEVEL (UNFRACTIONATED)
Heparin Unfractionated: 0.23 [IU]/mL — ABNORMAL LOW (ref 0.30–0.70)
Heparin Unfractionated: >1.1 [IU]/mL — ABNORMAL HIGH (ref 0.30–0.70)

## 2024-03-27 LAB — MAGNESIUM: Magnesium: 1.9 mg/dL (ref 1.7–2.4)

## 2024-03-27 MED ORDER — POLYETHYLENE GLYCOL 3350 17 G PO PACK
17.0000 g | PACK | Freq: Once | ORAL | Status: AC
  Administered 2024-03-27: 11:00:00 17 g via ORAL
  Filled 2024-03-27: qty 1

## 2024-03-27 MED ORDER — AMIODARONE LOAD VIA INFUSION
150.0000 mg | Freq: Once | INTRAVENOUS | Status: AC
  Administered 2024-03-27: 09:00:00 150 mg via INTRAVENOUS
  Filled 2024-03-27: qty 83.34

## 2024-03-27 MED ORDER — HEPARIN BOLUS VIA INFUSION
2000.0000 [IU] | Freq: Once | INTRAVENOUS | Status: AC
  Administered 2024-03-27: 12:00:00 2000 [IU] via INTRAVENOUS
  Filled 2024-03-27: qty 2000

## 2024-03-27 MED ORDER — HEPARIN BOLUS VIA INFUSION
950.0000 [IU] | Freq: Once | INTRAVENOUS | Status: AC
  Administered 2024-03-27: 01:00:00 950 [IU] via INTRAVENOUS
  Filled 2024-03-27: qty 950

## 2024-03-27 MED ORDER — LIDOCAINE 5 % EX PTCH
2.0000 | MEDICATED_PATCH | CUTANEOUS | Status: DC
  Administered 2024-03-27: 03:00:00 2 via TRANSDERMAL
  Filled 2024-03-27 (×5): qty 2

## 2024-03-27 MED ORDER — AMIODARONE HCL IN DEXTROSE 360-4.14 MG/200ML-% IV SOLN
30.0000 mg/h | INTRAVENOUS | Status: DC
  Administered 2024-03-27 – 2024-03-28 (×2): 30 mg/h via INTRAVENOUS
  Filled 2024-03-27 (×2): qty 200

## 2024-03-27 MED ORDER — AMIODARONE HCL IN DEXTROSE 360-4.14 MG/200ML-% IV SOLN
60.0000 mg/h | INTRAVENOUS | Status: DC
  Administered 2024-03-27 (×2): 60 mg/h via INTRAVENOUS
  Filled 2024-03-27: qty 200

## 2024-03-27 MED ORDER — NAPHAZOLINE-PHENIRAMINE 0.025-0.3 % OP SOLN
1.0000 [drp] | Freq: Four times a day (QID) | OPHTHALMIC | Status: DC
  Administered 2024-03-27 – 2024-03-31 (×17): 1 [drp] via OPHTHALMIC
  Filled 2024-03-27: qty 5

## 2024-03-27 NOTE — Plan of Care (Signed)
°  Problem: Education: Goal: Understanding of CV disease, CV risk reduction, and recovery process will improve 03/27/2024 1531 by Lynnette Cena CROME, RN Outcome: Progressing 03/27/2024 1529 by Lynnette Cena CROME, RN Outcome: Progressing Goal: Individualized Educational Video(s) 03/27/2024 1531 by Lynnette Cena CROME, RN Outcome: Progressing 03/27/2024 1529 by Lynnette Cena CROME, RN Outcome: Progressing

## 2024-03-27 NOTE — Plan of Care (Signed)
  Problem: Education: Goal: Understanding of CV disease, CV risk reduction, and recovery process will improve Outcome: Progressing Goal: Individualized Educational Video(s) Outcome: Progressing   

## 2024-03-27 NOTE — Progress Notes (Signed)
 PHARMACY - ANTICOAGULATION CONSULT NOTE  Pharmacy Consult for Heparin  Indication: chest pain/ACS, aflutter, PVD.   Allergies[1]  Patient Measurements: Height: 5' 7 (170.2 cm) Weight: 64.1 kg (141 lb 5 oz) IBW/kg (Calculated) : 66.1 HEPARIN  DW (KG): 64.1  Labs: Recent Labs    03/25/24 0307 03/25/24 0758 03/26/24 0356 03/26/24 1357 03/26/24 2234 03/27/24 0441 03/27/24 1029  HGB 12.0*  --  12.4*  --   --  12.9*  --   HCT 40.0  --  40.5  --   --  42.6  --   PLT 472*  --  538*  --   --  557*  --   HEPARINUNFRC  --    < > 0.19* 0.61 0.26*  --  0.23*  CREATININE 2.37*  --  2.27*  --   --  2.34*  --    < > = values in this interval not displayed.    Estimated Creatinine Clearance: 20.9 mL/min (A) (by C-G formula based on SCr of 2.34 mg/dL (H)).   Medical History: Past Medical History:  Diagnosis Date   Acute deep vein thrombosis (DVT) of right femoral vein (HCC) 10/28/2020   US  10/24/2020 [facility] Acute right lower extremity DVT extending from the common femoral vein to  the popliteal vein.     Bladder cancer (HCC)    Genital herpes    Hyperglycemia    Hypertension    Squamous cell carcinoma    Assessment: Patient is a 83 yo M presenting for right/left heart catheterization ad coronary angiography. PMH significant for essential hypertension, recurrent vertigo, polycythemia vera, hypothyroidism, history of DVTs, atrial fibrillation and flutter, history of bladder cancer peripheral arterial disease, and UTI. Pharmacy is consulted for Heparin  dosing and monitoring s/p heart cath and angiography. Per cardiology MD, plan is to start Heparin  infusion 8 hours post sheath removal. No initial bolus needed per MD. Sheath removed at ~16:20.   Med rec states patient is taking Eliquis  5 mg daily. Dispense report shows Eliquis  2.5 mg BID last filled 12/20/23 for 30d supply.   Date Time HL Rate/Comment  1213  0815  < 0.1 Subthera; 800 un/hr 1213 2001 < 0.1  Subthera; 1000 un/hr 1214 0758 <  0.1 Subthera; 1150 un/hr  1214 1853 0.28 Subtherapeutic 1215 0356 0.19 Subtherapeutic 1215 1357 0.61  1215    2234    0.26     SUBtherapeutic  1216 1029 0.23 Subtherapeutic.   Goal of Therapy:  Heparin  level 0.3-0.7 units/ml Monitor platelets by anticoagulation protocol: Yes   Plan:  Heparin  level is subtherapeutic. Will give heparin  bolus of 2000 units x 1 and increase heparin  infusion to continue heparin  infusion at 1950 unit/hr. Recheck heparin  level in 8 hours. CBC daily while on heparin .   Cathaleen GORMAN Blanch, PharmD, BCPS 03/27/2024 11:01 AM          [1]  Allergies Allergen Reactions   Losartan Potassium Swelling    angioedema Other reaction(s): Angioedema   Shellfish Allergy Swelling and Rash    Rash, itching, swelling   Currently eats SHELLFISH without symptoms   Shellfish Protein-Containing Drug Products Dermatitis and Swelling    Rash, itching, swelling    Rash, itching, swelling   Currently eats SHELLFISH without symptoms   Doxycycline  Other (See Comments)    Blisters on hand   Ultram  [Tramadol Hcl] Nausea And Vomiting   Celecoxib Itching, Swelling, Rash and Other (See Comments)    Cannot take when eating shellfish   Codeine Palpitations  Heart racing   Cyclobenzaprine Other (See Comments)    Musculoskeletal therapy agents cause Excessive sedation Musculoskeletal therapy agents cause excessive sedation   Tramadol Hcl Nausea And Vomiting

## 2024-03-27 NOTE — Plan of Care (Signed)
°  Problem: Education: Goal: Understanding of CV disease, CV risk reduction, and recovery process will improve 03/27/2024 1534 by Lynnette Cena CROME, RN Outcome: Progressing 03/27/2024 1531 by Lynnette Cena CROME, RN Outcome: Progressing 03/27/2024 1529 by Lynnette Cena CROME, RN Outcome: Progressing Goal: Individualized Educational Video(s) 03/27/2024 1531 by Lynnette Cena CROME, RN Outcome: Progressing 03/27/2024 1529 by Lynnette Cena CROME, RN Outcome: Progressing

## 2024-03-27 NOTE — Progress Notes (Signed)
 Heart Failure Navigator Progress Note Assessed for Heart & Vascular TOC clinic readiness. Patient was open to CHF education but not willing to attend an appointment at the CHF Clinic at this time.  He said that if he changed his mind he would give us  a call.   Education Assessment and Provision: Detailed education and instructions provided on heart failure disease management including the following:  Signs and symptoms of Heart Failure When to call the physician Importance of daily weights Low sodium diet Fluid restriction Medication management Anticipated future follow-up appointments  Patient education given on each of the above topics.  Patient acknowledges understanding via teach back method and acceptance of all instructions.  Education Materials:  Living Better With Heart Failure Booklet, HF zone tool, & Daily Weight Tracker Tool.  Patient has scale at home: Yes-Patient said that he was doing daily weights.  Patient has pill box at home: Yes    Navigator available for reassessment of patient.   Charmaine Pines, RN, BSN [facility] Heart Failure Navigator Secure Chat Only

## 2024-03-27 NOTE — Progress Notes (Signed)
 Progress Note    03/27/2024 10:38 AM 1 Day Post-Op  Subjective:  Jerry Mcpherson is an 83 yo male who is POD # 1 from:  Date of Surgery: 03/23/2024 - 03/26/2024   Surgeon(s):DEW,JASON     Assistants:none   Pre-operative Diagnosis: PAD with ulceration left lower extremity   Post-operative diagnosis:  Same   Procedure(s) Performed:             1.  Ultrasound guidance for vascular access right femoral artery             2.  Catheter placement into left SFA from right femoral approach             3.  Aortogram and selective left lower extremity angiogram             4.  StarClose closure device right femoral artery   EBL: 3 cc   Contrast: 35 cc   Fluoro Time: 1.1 minutes  Patient is resting comfortably in bed this morning.  He endorses that his left lower extremity feels much better this morning.  Heparin  infusion continues to run.  No complaints overnight.  Patient on amiodarone  infusion for atrial fibrillation.  Vitals all remained stable.  Vitals:   03/27/24 0557 03/27/24 0800  BP: 105/69 (!) 83/60  Pulse: (!) 115   Resp: 18   Temp: 98.5 F (36.9 C) 98.3 F (36.8 C)  SpO2: 93% 91%   Physical Exam: Cardiac:  Irregular Rhythm on Amiodarone . No murmurs appreciated.  Lungs:  Non labored breathing, clear on auscultation. No rale rhonchi or wheezing noted.   Incisions: Right groin puncture site with dressing clean dry and intact.  No hematoma seroma or infection of note. Extremities: Bilateral lower extremities warm to touch with palpable pulses. Abdomen: Bowel sounds present on auscultation.  Soft nontender not distended. Neurologic: Alert and oriented x 3, very very hard of hearing.  When he does hear you he will follow commands and answer questions appropriately  CBC    Component Value Date/Time   WBC 22.4 (H) 03/27/2024 0441   RBC 5.09 03/27/2024 0441   HGB 12.9 (L) 03/27/2024 0441   HGB 13.4 03/21/2024 1001   HCT 42.6 03/27/2024 0441   HCT 44.9 03/21/2024  1001   PLT 557 (H) 03/27/2024 0441   PLT 592 (H) 03/21/2024 1001   MCV 83.7 03/27/2024 0441   MCV 85 03/21/2024 1001   MCH 25.3 (L) 03/27/2024 0441   MCHC 30.3 03/27/2024 0441   RDW 24.2 (H) 03/27/2024 0441   RDW 20.7 (H) 03/21/2024 1001   LYMPHSABS 1.6 03/23/2024 1654   LYMPHSABS 1.6 05/15/2018 1501   MONOABS 1.0 03/23/2024 1654   EOSABS 1.0 (H) 03/23/2024 1654   EOSABS 0.5 (H) 05/15/2018 1501   BASOSABS 0.3 (H) 03/23/2024 1654   BASOSABS 0.2 05/15/2018 1501    BMET    Component Value Date/Time   NA 136 03/27/2024 0441   NA 141 03/21/2024 1001   K 4.5 03/27/2024 0441   CL 104 03/27/2024 0441   CO2 19 (L) 03/27/2024 0441   GLUCOSE 106 (H) 03/27/2024 0441   BUN 33 (H) 03/27/2024 0441   BUN 52 (H) 03/21/2024 1001   CREATININE 2.34 (H) 03/27/2024 0441   CALCIUM  8.2 (L) 03/27/2024 0441   GFRNONAA 27 (L) 03/27/2024 0441   GFRAA 72 08/26/2017 1144    INR    Component Value Date/Time   INR 1.2 03/23/2024 1654     Intake/Output Summary (Last 24 hours) at  03/27/2024 1038 Last data filed at 03/27/2024 0900 Gross per 24 hour  Intake 990.26 ml  Output 1675 ml  Net -684.74 ml     Assessment/Plan:  83 y.o. male is s/p SEE ABOVE 1 Day Post-Op   PLAN Left lower extremity angiogram yesterday was diagnostic.  No intervention was needed at this time.  Patient noted to have very small vessel disease in his foot.  Patient remains on heparin  infusion.  Patient's need for further anticoagulation to be decided by cardiology based on his atrial fibrillation. Vascular surgery to sign off this case at this time.  DVT prophylaxis: Heparin  infusion   Gwendlyn JONELLE Shank [facility] 03/27/2024 10:38 AM

## 2024-03-27 NOTE — Progress Notes (Signed)
 [facility]  ROUNDING NOTE   Subjective:   Patient underwent angiogram yesterday. Creatinine slightly higher at 2.3. Patient in good spirits.  Lab Results  Component Value Date   CREATININE 2.34 (H) 03/27/2024   CREATININE 2.27 (H) 03/26/2024   CREATININE 2.37 (H) 03/25/2024   12/15 0701 - 12/16 0700 In: 870.3 [I.V.:870.3] Out: 1675 [Urine:1675]   Objective:  Vital signs in last 24 hours:  Temp:  [97.9 F (36.6 C)-99.4 F (37.4 C)] 97.9 F (36.6 C) (12/16 1555) Pulse Rate:  [72-115] 100 (12/16 1555) Resp:  [18-23] 18 (12/16 1555) BP: (83-125)/(54-76) 92/71 (12/16 1555) SpO2:  [91 %-99 %] 95 % (12/16 1555)  Weight change:  Filed Weights   03/23/24 0909 03/24/24 1400 03/26/24 1422  Weight: 65.7 kg 64.1 kg 64.1 kg    Intake/Output: I/O last 3 completed shifts: In: 2525.3 [P.O.:360; I.V.:2165.3] Out: 3625 [Urine:3625]   Intake/Output this shift:  Total I/O In: 802.3 [P.O.:240; I.V.:562.3] Out: 450 [Urine:450]  Physical Exam: General: No acute distress  Head: Normocephalic, atraumatic. Moist oral mucosal membranes  Neck: Supple  Lungs:  Clear to auscultation, normal effort  Heart: S1S2 irregular  Abdomen:  Soft, nontender, bowel sounds present, urostomy present  Extremities: No peripheral edema.  Neurologic: Awake, alert, following commands  Skin: No acute rash  Access: No hemodialysis access    Basic Metabolic Panel: Recent Labs  Lab 03/21/24 1001 03/23/24 1335 03/23/24 1338 03/24/24 0346 03/24/24 1431 03/25/24 0307 03/26/24 0356 03/27/24 0441  NA 141   < > 142 140  --  138 138 136  K 4.9   < > 4.5 4.5 4.7 4.2 4.3 4.5  CL 106  --   --  109  --  107 106 104  CO2 18*  --   --  20*  --  18* 18* 19*  GLUCOSE 102*  --   --  99  --  111* 100* 106*  BUN 52*  --   --  45*  --  45* 38* 33*  CREATININE 2.43*  --   --  2.26*  --  2.37* 2.27* 2.34*  CALCIUM  9.0  --   --  8.8*  --  7.8* 8.0* 8.2*  MG  --   --   --   --  2.0 1.9 1.9 1.9   <  > = values in this interval not displayed.    Liver Function Tests: No results for input(s): AST, ALT, ALKPHOS, BILITOT, PROT, ALBUMIN in the last 168 hours. No results for input(s): LIPASE, AMYLASE in the last 168 hours. No results for input(s): AMMONIA in the last 168 hours.  CBC: Recent Labs  Lab 03/23/24 1654 03/24/24 0346 03/25/24 0307 03/26/24 0356 03/27/24 0441  WBC 23.3* 24.5* 23.4* 22.3* 22.4*  NEUTROABS 19.1*  --   --   --   --   HGB 16.0 13.6 12.0* 12.4* 12.9*  HCT 52.3* 45.9 40.0 40.5 42.6  MCV 83.7 85.2 83.2 83.7 83.7  PLT 579* 556* 472* 538* 557*    Cardiac Enzymes: No results for input(s): CKTOTAL, CKMB, CKMBINDEX, TROPONINI in the last 168 hours.  BNP: Invalid input(s): POCBNP  CBG: Recent Labs  Lab 03/24/24 1346 03/26/24 1310  GLUCAP 110* 112*    Microbiology: Results for orders placed or performed during the hospital encounter of 03/23/24  MRSA Next Gen by PCR, Nasal     Status: None   Collection Time: 03/24/24  1:43 PM   Specimen: Nasal Mucosa; Nasal Swab  Result  Value Ref Range Status   MRSA by PCR Next Gen NOT DETECTED NOT DETECTED Final    Comment: (NOTE) The GeneXpert MRSA Assay (FDA approved for NASAL specimens only), is one component of a comprehensive MRSA colonization surveillance program. It is not intended to diagnose MRSA infection nor to guide or monitor treatment for MRSA infections. Test performance is not FDA approved in patients less than 13 years old. Performed at [facility], [address] [place], [place] [address]     Coagulation Studies: No results for input(s): LABPROT, INR in the last 72 hours.   Urinalysis: No results for input(s): COLORURINE, LABSPEC, PHURINE, GLUCOSEU, HGBUR, BILIRUBINUR, KETONESUR, PROTEINUR, UROBILINOGEN, NITRITE, LEUKOCYTESUR in the last 72 hours.  Invalid input(s): APPERANCEUR     Imaging: PERIPHERAL VASCULAR  CATHETERIZATION Result Date: 03/26/2024 See surgical note for result.    Medications:    amiodarone  30 mg/hr (03/27/24 1701)   heparin  1,950 Units/hr (03/27/24 1702)    atorvastatin   80 mg Oral Daily   Chlorhexidine  Gluconate Cloth  6 each Topical Daily   clopidogrel   75 mg Oral Daily   ferrous sulfate   325 mg Oral Daily   fluticasone  furoate-vilanterol  1 puff Inhalation Daily   hydroxyurea   500 mg Oral Daily   levothyroxine   50 mcg Oral Q0600   lidocaine   2 patch Transdermal Q24H   naphazoline-pheniramine  1 drop Left Eye QID   sodium bicarbonate   1,300 mg Oral BID   sodium chloride  flush  3 mL Intravenous Q12H   oxyCODONE  **AND** acetaminophen , acetaminophen , ondansetron  (ZOFRAN ) IV, sodium chloride  flush  Assessment/ Plan:  83 y.o. male with a PMHx of chronic systolic heart failure ejection fraction 30 to 35%, hypertension, hypothyroidism, DVT, paroxysmal atrial fibrillation on Eliquis , bladder cancer status post cystectomy with chronic urostomy, peripheral vascular disease, who was admitted to [facility] on 03/23/2024 for elective left heart catheterization.    1.  Chronic kidney disease stage IV.  Patient close to his baseline creatinine at time of initial evaluation.  He was exposed to contrast 03/23/2024, 03/26/2024.   Update: Patient underwent left lower extremity angiogram yesterday.  Creatinine currently 2.3 and relatively stable.  Continue to monitor renal parameters daily.   2.  Coronary artery disease/chronic systolic heart failure.  Patient underwent cardiac catheterization on 03/23/2024.  He had 70% stenosis in the left main coronary artery.  Palliative care consulted.  3.  Secondary hyperparathyroidism.  Monitor bone metabolism parameters over the course of hospitalization.   4.  Peripheral vascular disease.  Angiogram performed 03/26/2024.  Monitor renal parameters as above.     LOS: 3 Jerry Mcpherson 12/16/20255:19 PM

## 2024-03-27 NOTE — Progress Notes (Signed)
 1      PROGRESS NOTE    Jerry Mcpherson  FMW:[id number] DOB: 06-02-1940 DOA: 03/23/2024 PCP: [name], MD   Brief Narrative:  83 y.o. male with past medical history of recurrent auricles, chronic HFrEF with LVEF 30-35%, HTN, hypothyroidism, DVT, PAF on Eliquis , bladder cancer status post cystectomy and chronic urostomy, PVD, who has come in today for elective LHC.   12/12: Vascular surgery, cardiology consultation, right/left heart cath  12/13: Transfer to stepdown, intensivist consult due to persistent hypotension, nephrology c/s 12/14: transfer to PCU, Palliative care c/s 12/15: Left lower extremity angiogram 12/16: hypotensive   Assessment & Plan:   Principal Problem:   Chronic HFrEF (heart failure with reduced ejection fraction) (HCC) Active Problems:   Arthralgia of left lower leg   Atrial flutter (HCC)   Shock circulatory (HCC)   Atrial fibrillation with RVR (HCC)   AKI (acute kidney injury)   Pressure injury of skin   Atrial flutter/Fib with RVR - On heparin  drip, needed transfer to [place] for <24 hrs for persistent hypotension - - needed Levophed  for a short period on 12/13 and has been off since then - consider DOAC/Eliquis  transition tomorrow per cardio - continue IV amiodarone  - may transition to PO per cardio. BP running low today, may need to transfer to [place] again if HR goes up or have persistent hypotension for pressors as he's still wanting aggressive care - cardio following  Left 3rd and 4th toe chronic ulcer History of PVD - The ulcer appears to be chronic, likely secondary to PVD as pedal pulses on the left significantly diminished compared to the right side, has been followed by wound care center with weekly dressing change. wound appears to be stable and no signs of infection. - vascular surgery-s/p diagnostic angiogram of the left lower extremity - anterior tibial and posterior tibial arteries occlusion with lots of small vessel dz - no  revascularization option    Chronic leukocytosis - As per oncology's note from earlier this month, chronic leukocytosis likely secondary to polycythemia vera, for which patient has been taking hydroxyurea . - Patient denied any cough, no urinary symptoms or diarrhea and left fourth toe ulcer appears to be chronic, less likely has a active infection at this point.  Plan to monitor off antibiotics.   Decompensated combined non-anion gap metabolic acidosis and respiratory alkalosis  CKD stage IV Bladder cancer status post resection with chronic urostomy - Euvolemic, creatinine level stable - Nephrology seen, continue IVF   Syncope - Likely secondary to worsening of chronic HFrEF as well as moderate to severe aortic stenosis - followed by cardiology, - conservative management. - He appears to be euvolemic at this point  CAD with left main stenosis seen on cardiac cath this admission Chronic systolic and diastolic heart failure with EF of 30 to 35% Moderate aortic stenosis For now continue heparin  and Plavix , statin   Polycythemia vera - Hemoglobin stable, continue hydroxyurea  - Patient follow-up with hematology.   Hypothyroidism - Continue Synthroid    Goals of care-palliative care seen,  His overall prognosis is poor   DVT prophylaxis: Heparin  drip      Code Status: Full code Family Communication: None at bedside Disposition Plan: Possible discharge in 2-3 days depending on clinical condition, cardiology, nephrology evaluation   Consultants:  Nephrology Vascular surgery Cardiology Intensivist Palliative care     Subjective:  Continues to get frustrated when GOC being discussed. C/o foot pain  Objective: Vitals:   03/27/24 0800 03/27/24 1044  03/27/24 1555 03/27/24 1918  BP: (!) 83/60 (!) 95/54 92/71 (!) 84/66  Pulse:  (!) 111 100 93  Resp: 20 (!) 23 18 18   Temp: 98.3 F (36.8 C)  97.9 F (36.6 C) 97.7 F (36.5 C)  TempSrc: Oral     SpO2: 91% 98% 95% 95%   Weight:      Height:        Intake/Output Summary (Last 24 hours) at 03/27/2024 2107 Last data filed at 03/27/2024 1956 Gross per 24 hour  Intake 1082.75 ml  Output 1425 ml  Net -342.25 ml   Filed Weights   03/23/24 0909 03/24/24 1400 03/26/24 1422  Weight: 65.7 kg 64.1 kg 64.1 kg    Examination:  General exam: Appears calm and comfortable, elderly and chronically ill/cachectic looking Respiratory system: Clear to auscultation. Respiratory effort normal. Cardiovascular system: S1,S2 normal. No M/R/G Gastrointestinal system: Abdomen is soft, benign Central nervous system: Alert and oriented. No focal neurological deficits. Extremities: Symmetric 5 x 5 power. Skin: Left foot in the dressing for small superficial ulceration between 3rd and 4th toe Psychiatry: Judgement and insight appear normal. Mood & affect appropriate.     Data Reviewed: I have personally reviewed following labs and imaging studies  CBC: Recent Labs  Lab 03/23/24 1654 03/24/24 0346 03/25/24 0307 03/26/24 0356 03/27/24 0441  WBC 23.3* 24.5* 23.4* 22.3* 22.4*  NEUTROABS 19.1*  --   --   --   --   HGB 16.0 13.6 12.0* 12.4* 12.9*  HCT 52.3* 45.9 40.0 40.5 42.6  MCV 83.7 85.2 83.2 83.7 83.7  PLT 579* 556* 472* 538* 557*   Basic Metabolic Panel: Recent Labs  Lab 03/21/24 1001 03/23/24 1335 03/23/24 1338 03/24/24 0346 03/24/24 1431 03/25/24 0307 03/26/24 0356 03/27/24 0441  NA 141   < > 142 140  --  138 138 136  K 4.9   < > 4.5 4.5 4.7 4.2 4.3 4.5  CL 106  --   --  109  --  107 106 104  CO2 18*  --   --  20*  --  18* 18* 19*  GLUCOSE 102*  --   --  99  --  111* 100* 106*  BUN 52*  --   --  45*  --  45* 38* 33*  CREATININE 2.43*  --   --  2.26*  --  2.37* 2.27* 2.34*  CALCIUM  9.0  --   --  8.8*  --  7.8* 8.0* 8.2*  MG  --   --   --   --  2.0 1.9 1.9 1.9   < > = values in this interval not displayed.    Coagulation Profile: Recent Labs  Lab 03/23/24 1654  INR 1.2    Sepsis  Labs: Recent Labs  Lab 03/23/24 1654  LATICACIDVEN 1.6    Recent Results (from the past 240 hours)  MRSA Next Gen by PCR, Nasal     Status: None   Collection Time: 03/24/24  1:43 PM   Specimen: Nasal Mucosa; Nasal Swab  Result Value Ref Range Status   MRSA by PCR Next Gen NOT DETECTED NOT DETECTED Final    Comment: (NOTE) The GeneXpert MRSA Assay (FDA approved for NASAL specimens only), is one component of a comprehensive MRSA colonization surveillance program. It is not intended to diagnose MRSA infection nor to guide or monitor treatment for MRSA infections. Test performance is not FDA approved in patients less than 25 years old. Performed at [facility], [address] [place], [place] [address]          Radiology Studies: PERIPHERAL VASCULAR CATHETERIZATION Result Date: 03/26/2024 See surgical note for result.       Scheduled Meds:  atorvastatin   80 mg Oral Daily   Chlorhexidine  Gluconate Cloth  6 each Topical Daily   clopidogrel   75 mg Oral Daily   ferrous sulfate   325 mg Oral Daily   fluticasone  furoate-vilanterol  1 puff Inhalation Daily   hydroxyurea   500 mg Oral Daily   levothyroxine   50 mcg Oral Q0600   lidocaine   2 patch Transdermal Q24H   naphazoline-pheniramine  1 drop Left Eye QID   sodium bicarbonate   1,300 mg Oral BID   sodium chloride  flush  3 mL Intravenous Q12H   Continuous Infusions:  amiodarone  30 mg/hr (03/27/24 1701)   heparin  1,950 Units/hr (03/27/24 1702)     LOS: 3 days    Time spent : 35 minutes    Jerry Rodak Maree, MD [facility] Pager [phone]  If 7PM-7AM, please contact night-coverage  03/27/2024, 9:07 PM

## 2024-03-27 NOTE — Progress Notes (Signed)
 Palliative Care Progress Note, Assessment & Plan   Patient Name: Greyson Peavy Fullenwider       Date: 03/27/2024 DOB: 30-Aug-1940  Age: 83 y.o. MRN#: [id number] Attending Physician: Maree Hue, MD Primary Care Physician: [name], MD Admit Date: 03/23/2024  Subjective: Patient is sitting up in bed, awake and alert.  He acknowledges my presence and is able to make his wishes known.  No family or friends are present at bedside during my visit.  HPI: 83 y.o. male  with past medical history significant for recurrent auricles, chronic HFrEF with LVEF 30-35%, HTN, hypothyroidism, DVT, PAF on Eliquis , bladder cancer s/p cystectomy w/ chronic urostomy, PVD and polycythemia vera. Patient presented to [facility] for elective RHC/LHC and coronary angiography.    Findings of cardiac catherization 03/23/2024: Severe left main coronary artery disease with 70% proximal stenosis.  Otherwise, mild-moderate CAD. Normal left and right heart filling pressures. Moderately reduced Fick cardiac output/index (CO 3.1 L/min, CI 1.8 L/min/m).   Pt was referred for admission for: Optimization of heart failure and further evaluation of suspected critical limb ischemia involving the left foot and worsening leukocytosis. May need to consider transfer to [facility] for consideration of LMCA stenting, though given the patient's comorbidities, palliative medical therapy will also need to be considered.  I do not think he is a candidate for CABG. Gentle postcatheterization hydration in the setting of low normal filling pressures and CKD stage IV. Vascular was consulted and recommended to undergo angiogram of LLE for further evaluation and potential revascularization with hope of salvaging limb after assessment concerning for critical L lower  limb ischemia.   Pt was admitted for management of A flutter with RVR, L 3rd and 4th chronic toe ulcer, chronic leukocytosis, CKD IV, chronic HFrEF and polycythemia vera.    Palliative team was consulted to assist with goals of care conversations.   Summary of counseling/coordination of care: Extensive chart review completed prior to meeting patient including labs, vital signs, imaging, progress notes, orders, and available advanced directive documents from current and previous encounters.   After reviewing the patient's chart and assessing the patient at bedside, I spoke with patient in regards to symptom management and goals of care.   I attempted to gauge patient's understanding of current medical situation.  He shares that he is ready to discharge home and see what happens.  When asked to further elaborate on his understanding of his medical conditions, he shares that his legs are weak and he is hoping to get them stronger.  He also knows that his heart is weak but that he is hoping that will get stronger as well.  A very frank and honest discussion held with patient about the reality of his ongoing comorbidities.  Discussed significance of HFrEF with LVEF of 30 to 35%.  Additionally, extensive discussion of PVD, PAF.   Attempted to provide education on patient's overall functional, nutritional, and cognitive status as significant indicators of his overall prognosis.  Discussed heart failure as a chronic, irreversible disease that is significantly contributing to poor circulation thus not allowing for appropriate perfusion to his lower extremities.  He endorses understanding but says he will cross that bridge when he gets  to it.  Advanced directives and boundaries of medical care discussed with patient.  He shares that he will think about it when the time comes.  I implored patient to consider medical boundaries of care if he knows what they are at this time.  Discussed importance of  knowing patient's wishes prior to any urgent or emergent event.  At this point of the discussion, patient referenced that his ugly side could, L.  He shares that he does not want to come out but that he does not have to give me any answers at this time.  Therapeutic silence, active listening, and emotional support provided.  I encouraged patient to consider DNR/DNI status.  At first, patient shares that he would never allow himself to be put on a ventilator.  However, further end of our discussion he shares that if push came to shove he would except anything and everything to keep him alive.  Human mortality, being kept alive versus living, and quality versus quantity of life discussed.  Patient was hesitant to continue goals of care discussions at this time.  He shares that he will think about it later.  I again encouraged him to consider boundaries of care and to make his wishes known in advance of any acute issues.  He shares that he is can go home and see what happens.  Outpatient palliative services described in detail.  Patient declined saying he does not need to talk about this any further.  Full code and full scope remain.  Above conveyed to attending.  PMT remains available to patient throughout his hospitalization.  Physical Exam Vitals reviewed.  Constitutional:      General: He is not in acute distress.    Appearance: He is normal weight.  HENT:     Head: Normocephalic.     Mouth/Throat:     Mouth: Mucous membranes are moist.  Eyes:     Pupils: Pupils are equal, round, and reactive to light.  Cardiovascular:     Rate and Rhythm: Normal rate. Rhythm irregular.  Pulmonary:     Effort: Pulmonary effort is normal.  Abdominal:     Palpations: Abdomen is soft.  Skin:    General: Skin is warm and dry.  Neurological:     Mental Status: He is alert and oriented to person, place, and time.  Psychiatric:        Mood and Affect: Mood normal.        Behavior: Behavior  normal.              Recommendations:   Full code/full scope remains Patient declined outpatient palliative support at discharge Continue current plan of care  50 minute visit includes: Detailed review of medical records (labs, imaging, vital signs), medically appropriate exam (mental status, respiratory, cardiac, skin), discussed with treatment team, counseling and educating patient, family and staff, documenting clinical information, medication management and coordination of care.  Lamarr L. Arvid, DNP, FNP-BC Palliative Medicine Team

## 2024-03-27 NOTE — Progress Notes (Signed)
 PHARMACY - ANTICOAGULATION CONSULT NOTE  Pharmacy Consult for Heparin  Indication: chest pain/ACS, aflutter, PVD.   Allergies[1]  Patient Measurements: Height: 5' 7 (170.2 cm) Weight: 64.1 kg (141 lb 5 oz) IBW/kg (Calculated) : 66.1 HEPARIN  DW (KG): 64.1  Labs: Recent Labs    03/24/24 0346 03/24/24 0815 03/25/24 0307 03/25/24 0758 03/26/24 0356 03/26/24 1357 03/26/24 2234  HGB 13.6  --  12.0*  --  12.4*  --   --   HCT 45.9  --  40.0  --  40.5  --   --   PLT 556*  --  472*  --  538*  --   --   HEPARINUNFRC  --    < >  --    < > 0.19* 0.61 0.26*  CREATININE 2.26*  --  2.37*  --  2.27*  --   --    < > = values in this interval not displayed.    Estimated Creatinine Clearance: 21.6 mL/min (A) (by C-G formula based on SCr of 2.27 mg/dL (H)).   Medical History: Past Medical History:  Diagnosis Date   Acute deep vein thrombosis (DVT) of right femoral vein (HCC) 10/28/2020   US  10/24/2020 [facility] Acute right lower extremity DVT extending from the common femoral vein to  the popliteal vein.     Bladder cancer (HCC)    Genital herpes    Hyperglycemia    Hypertension    Squamous cell carcinoma    Assessment: Patient is a 83 yo M presenting for right/left heart catheterization ad coronary angiography. PMH significant for essential hypertension, recurrent vertigo, polycythemia vera, hypothyroidism, history of DVTs, atrial fibrillation and flutter, history of bladder cancer peripheral arterial disease, and UTI. Pharmacy is consulted for Heparin  dosing and monitoring s/p heart cath and angiography. Per cardiology MD, plan is to start Heparin  infusion 8 hours post sheath removal. No initial bolus needed per MD. Sheath removed at ~16:20.   Med rec states patient is taking Eliquis  5 mg daily. Dispense report shows Eliquis  2.5 mg BID last filled 12/20/23 for 30d supply.   Date Time HL Rate/Comment  1213  0815  < 0.1 Subthera; 800 un/hr 1213 2001 < 0.1  Subthera; 1000 un/hr 1214 0758 <  0.1 Subthera; 1150 un/hr  1214 1853 0.28 Subtherapeutic 1215 0356 0.19 Subtherapeutic 1215 1357 0.61  1215    2234    0.26     SUBtherapeutic   Goal of Therapy:  Heparin  level 0.3-0.7 units/ml Monitor platelets by anticoagulation protocol: Yes   Plan:  12/15:  HL @ 2234 = 0.26, SUBtherapeutic - Will order heparin  950 units IV X 1 bolus and increase drip rate to 1850 units/hr  - Recheck HL 8 hrs after rate change - CBC daily   Zaelyn Barbary D 03/27/2024 12:34 AM         [1]  Allergies Allergen Reactions   Losartan Potassium Swelling    angioedema Other reaction(s): Angioedema   Shellfish Allergy Swelling and Rash    Rash, itching, swelling   Currently eats SHELLFISH without symptoms   Shellfish Protein-Containing Drug Products Dermatitis and Swelling    Rash, itching, swelling    Rash, itching, swelling   Currently eats SHELLFISH without symptoms   Doxycycline  Other (See Comments)    Blisters on hand   Ultram  [Tramadol Hcl] Nausea And Vomiting   Celecoxib Itching, Swelling, Rash and Other (See Comments)    Cannot take when eating shellfish   Codeine Palpitations    Heart racing  Cyclobenzaprine Other (See Comments)    Musculoskeletal therapy agents cause Excessive sedation Musculoskeletal therapy agents cause excessive sedation   Tramadol Hcl Nausea And Vomiting

## 2024-03-27 NOTE — Progress Notes (Signed)
 Rounding Note   Patient Name: Jerry Mcpherson Date of Encounter: 03/27/2024  [facility] Cardiologist: None   Subjective Atrial fibrillation/flutter noted on telemetry overnight into this morning. He remains in atrial fibrillation and is overall asymptomatic to this. Reports weakness and ongoing dyspnea. No chest pain or palpitations. Long discussion regarding overall outlook given multiple serious health issues and comorbidities. Recommend ongoing discussions with palliative care.   Scheduled Meds:  atorvastatin   80 mg Oral Daily   Chlorhexidine  Gluconate Cloth  6 each Topical Daily   ciprofloxacin   2 drop Left Eye Q4H while awake   clopidogrel   75 mg Oral Daily   ferrous sulfate   325 mg Oral Daily   fluticasone  furoate-vilanterol  1 puff Inhalation Daily   hydroxyurea   500 mg Oral Daily   levothyroxine   50 mcg Oral Q0600   lidocaine   2 patch Transdermal Q24H   sodium bicarbonate   1,300 mg Oral BID   sodium chloride  flush  3 mL Intravenous Q12H   Continuous Infusions:  amiodarone      Followed by   amiodarone      heparin  1,850 Units/hr (03/27/24 0500)   PRN Meds: oxyCODONE  **AND** acetaminophen , acetaminophen , ondansetron  (ZOFRAN ) IV, sodium chloride  flush   Vital Signs  Vitals:   03/26/24 1952 03/27/24 0104 03/27/24 0557 03/27/24 0800  BP: 111/62 125/76 105/69 (!) 83/60  Pulse: 72 (!) 108 (!) 115   Resp: (!) 21 20 18    Temp: 98 F (36.7 C) 99.4 F (37.4 C) 98.5 F (36.9 C) 98.3 F (36.8 C)  TempSrc: Oral   Oral  SpO2: 99% 95% 93% 91%  Weight:      Height:        Intake/Output Summary (Last 24 hours) at 03/27/2024 0956 Last data filed at 03/27/2024 0500 Gross per 24 hour  Intake 870.26 ml  Output 1675 ml  Net -804.74 ml      03/26/2024    2:22 PM 03/24/2024    2:00 PM 03/23/2024    9:09 AM  Last 3 Weights  Weight (lbs) 141 lb 5 oz 141 lb 5 oz 144 lb 14.4 oz  Weight (kg) 64.1 kg 64.1 kg 65.726 kg      Telemetry Atrial  fibrillation/flutter with rate 100s-120s - Personally Reviewed  Physical Exam  GEN: No acute distress.   Neck: No JVD Cardiac: IRIR, tachycardic; II/VI systolic murmur, no rubs or gallops.  Respiratory: Diminished at the bases bilaterally GI: Soft, nontender, non-distended  MS: No edema; No deformity. Neuro:  Nonfocal  Psych: Normal affect   Labs High Sensitivity Troponin:  No results for input(s): TROPONINIHS in the last 720 hours. No results for input(s): TRNPT in the last 720 hours.     Chemistry Recent Labs  Lab 03/25/24 0307 03/26/24 0356 03/27/24 0441  NA 138 138 136  K 4.2 4.3 4.5  CL 107 106 104  CO2 18* 18* 19*  GLUCOSE 111* 100* 106*  BUN 45* 38* 33*  CREATININE 2.37* 2.27* 2.34*  CALCIUM  7.8* 8.0* 8.2*  MG 1.9 1.9 1.9  GFRNONAA 26* 28* 27*  ANIONGAP 12 14 14     Lipids No results for input(s): CHOL, TRIG, HDL, LABVLDL, LDLCALC, CHOLHDL in the last 168 hours.  Hematology Recent Labs  Lab 03/25/24 0307 03/26/24 0356 03/27/24 0441  WBC 23.4* 22.3* 22.4*  RBC 4.81 4.84 5.09  HGB 12.0* 12.4* 12.9*  HCT 40.0 40.5 42.6  MCV 83.2 83.7 83.7  MCH 24.9* 25.6* 25.3*  MCHC 30.0 30.6 30.3  RDW  23.3* 23.3* 24.2*  PLT 472* 538* 557*   Thyroid   Recent Labs  Lab 03/24/24 1405  TSH 9.610*    BNPNo results for input(s): BNP, PROBNP in the last 168 hours.  DDimer No results for input(s): DDIMER in the last 168 hours.   Radiology  PERIPHERAL VASCULAR CATHETERIZATION Result Date: 03/26/2024 See surgical note for result.   Cardiac Studies  R/LHC 03/23/2024:   Ost LM to Mid LM lesion is 70% stenosed.   Mid RCA to Dist RCA lesion is 35% stenosed.   LV end diastolic pressure is normal.   There is moderate aortic valve stenosis.   Conclusions: Severe left main coronary artery disease with 70% proximal stenosis.  Otherwise, mild-moderate coronary artery disease. Normal left and right heart filling pressures. Moderately reduced Fick  cardiac output/index (CO 3.1 L/min, CI 1.8 L/min/m). Moderate aortic valve stenosis (peak-to-peak gradient 22 mmHg).   Recommendations: Admit for optimization of heart failure and further evaluation of suspected critical limb ischemia involving the left foot and worsening leukocytosis. May need to consider transfer to [facility] for consideration of LMCA stenting, though given the patient's comorbidities, palliative medical therapy will also need to be considered.  I do not think he is a candidate for CABG. Gentle postcatheterization hydration in the setting of low normal filling pressures and CKD stage IV. __________   2D echo 02/03/2024: 1. Left ventricular ejection fraction, by estimation, is 30 to 35%. The  left ventricle has moderately decreased function. Left ventricular  endocardial border not optimally defined to evaluate regional wall motion.  There is mild concentric left  ventricular hypertrophy. Left ventricular diastolic parameters are  consistent with Grade I diastolic dysfunction (impaired relaxation). The  average left ventricular global longitudinal strain is -8.4 %. The global  longitudinal strain is abnormal.   2. Right ventricular systolic function is normal. The right ventricular  size is normal.   3. Mild to moderate mitral valve regurgitation. No evidence of mitral  stenosis.   4. There is severe calcifcation of the aortic valve. Aortic valve  regurgitation is trivial. Moderate low-flow, low-gradient stenosis. Aortic  valve mean gradient measures 11.0 mmHg. Aortic valve Vmax measures 2.29  m/s. AVA 1.49, DVI 0.36.   Comparison(s): A prior study was performed on 12/17/2023. No significant  change from prior study.   Patient Profile   83 y.o. 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  Assessment & Plan   Atrial fibrillation/flutter with RVR - Briefly required pressors while in flutter earlier this admission - Intermittent atrial fibrillation/flutter throughout admission - Remains in atrial fibrillation on telemetry this morning with rate 100s-120s bpm and low BP - Recommend amiodarone  bolus to attempt restoration of sinus rhythm - CHA2DS2-VASc 7, continue IV heparin    CAD with left main stenosis - Originally presenting for outpatient cardiac catheterization 12/12 which revealed severe left main coronary artery disease with 70% proximal stenosis -  Ongoing discussion with patient regarding high risk procedure to intervene on left main stenosis. Will defer to interventional cardiologist.  - Denies chest pain - Continue heparin , clopidogrel , and statin therapy - Ongoing discussion with palliative care  Chronic systolic and diastolic heart failure - EF 30 to 35% with CAD detailed above.  Normal right and left heart filling pressures on cath. - Cardiac index is 1.8 - Appears relatively euvolemic on exam.  Improvement in dyspnea. - Prior angioedema with ARB.  Continue to avoid ACE I, ARB, and ARNI - GDMT limited by hypotension and renal dysfunction  Aortic stenosis - Echo 12/2023 with moderate to severe aortic stenosis, mean gradient 19.8 mmHg and valve area 1.39 cm - Moderate by cath on 12/12 with peak to peak gradient 22 mmHg  PAD - Nonhealing ulceration of the left foot - Evaluated by vascular surgery with  angiogram done yesterday.  No revascularization options.  CKD stage IV - Creatinine overall stable at 2.34 - Will need to monitor volume status closely, received IV fluids yesterday - Nephrology following   For questions or updates, please contact [facility] Please consult [email or web address] for contact info under       Signed, Lesley LITTIE Maffucci, PA-C  03/27/2024, 9:56 AM

## 2024-03-27 NOTE — Progress Notes (Signed)
 PHARMACY - ANTICOAGULATION CONSULT NOTE  Pharmacy Consult for Heparin  Indication: chest pain/ACS, aflutter, PVD.   Allergies[1]  Patient Measurements: Height: 5' 7 (170.2 cm) Weight: 64.1 kg (141 lb 5 oz) IBW/kg (Calculated) : 66.1 HEPARIN  DW (KG): 64.1  Labs: Recent Labs    03/25/24 0307 03/25/24 0758 03/26/24 0356 03/26/24 1357 03/26/24 2234 03/27/24 0441 03/27/24 1029 03/27/24 2210  HGB 12.0*  --  12.4*  --   --  12.9*  --   --   HCT 40.0  --  40.5  --   --  42.6  --   --   PLT 472*  --  538*  --   --  557*  --   --   HEPARINUNFRC  --    < > 0.19*   < > 0.26*  --  0.23* >1.10*  CREATININE 2.37*  --  2.27*  --   --  2.34*  --   --    < > = values in this interval not displayed.    Estimated Creatinine Clearance: 20.9 mL/min (A) (by C-G formula based on SCr of 2.34 mg/dL (H)).   Medical History: Past Medical History:  Diagnosis Date   Acute deep vein thrombosis (DVT) of right femoral vein (HCC) 10/28/2020   US  10/24/2020 [facility] Acute right lower extremity DVT extending from the common femoral vein to  the popliteal vein.     Bladder cancer (HCC)    Genital herpes    Hyperglycemia    Hypertension    Squamous cell carcinoma    Assessment: Patient is a 83 yo M presenting for right/left heart catheterization ad coronary angiography. PMH significant for essential hypertension, recurrent vertigo, polycythemia vera, hypothyroidism, history of DVTs, atrial fibrillation and flutter, history of bladder cancer peripheral arterial disease, and UTI. Pharmacy is consulted for Heparin  dosing and monitoring s/p heart cath and angiography. Per cardiology MD, plan is to start Heparin  infusion 8 hours post sheath removal. No initial bolus needed per MD. Sheath removed at ~16:20.   Med rec states patient is taking Eliquis  5 mg daily. Dispense report shows Eliquis  2.5 mg BID last filled 12/20/23 for 30d supply.   Date Time HL Rate/Comment  1213  0815  < 0.1 Subthera; 800 un/hr 1213 2001 <  0.1  Subthera; 1000 un/hr 1214 0758 < 0.1 Subthera; 1150 un/hr  1214 1853 0.28 Subtherapeutic 1215 0356 0.19 Subtherapeutic 1215 1357 0.61  1215    2234    0.26     SUBtherapeutic  1216 1029 0.23 Subtherapeutic.  1216    2210   > 1.10   Elevated   Goal of Therapy:  Heparin  level 0.3-0.7 units/ml Monitor platelets by anticoagulation protocol: Yes   Plan:  12/16:  HL @ 2210 = > 1.10, elevated - spoke with lab who said phlebotomist drew sample from opposite arm as heparin  infusion - this result of HL > 1.10 is not consistent with previous results in which HL was repeatedly subtherapeutic.   Lab had difficulty obtaining sample (required 2 attempts by phlebotomy to draw HL) so suspect this result is some kind of error - STAT repeat HL ordered @ 2310   Markeith Jue D, PharmD 03/27/2024 11:14 PM           [1]  Allergies Allergen Reactions   Losartan Potassium Swelling    angioedema Other reaction(s): Angioedema   Shellfish Allergy Swelling and Rash    Rash, itching, swelling   Currently eats SHELLFISH without symptoms   Shellfish Protein-Containing  Drug Products Dermatitis and Swelling    Rash, itching, swelling    Rash, itching, swelling   Currently eats SHELLFISH without symptoms   Doxycycline  Other (See Comments)    Blisters on hand   Ultram  [Tramadol Hcl] Nausea And Vomiting   Celecoxib Itching, Swelling, Rash and Other (See Comments)    Cannot take when eating shellfish   Codeine Palpitations    Heart racing   Cyclobenzaprine Other (See Comments)    Musculoskeletal therapy agents cause Excessive sedation Musculoskeletal therapy agents cause excessive sedation   Tramadol Hcl Nausea And Vomiting

## 2024-03-27 NOTE — TOC CM/SW Note (Signed)
 Transition of Care [facility]) - Inpatient Brief Assessment   Patient Details  Name: Jerry Mcpherson MRN: [id number] Date of Birth: 01/25/1941  [facility] CM/SW Contact:    Shasta DELENA Daring, RN Phone Number: 03/27/2024, 10:00 AM   Clinical Narrative:  Chart reviewed. SDOH resources for transportation and food insecurity added to AVS. Will continue to follow for additional needs.  Transition of Care Asessment: Insurance and Status: Insurance coverage has been reviewed Patient has primary care physician: Yes Home environment has been reviewed: Single family home     Social Drivers of Health Review: SDOH reviewed interventions complete Readmission risk has been reviewed: Yes Transition of care needs: transition of care needs identified, TOC will continue to follow

## 2024-03-27 NOTE — Discharge Instructions (Addendum)
 Food Resources  Agency Name: [facility] Agency Address: [address], [place], [place] [address] Phone: [phone] Website: [email or web address] Service(s) Offered: Housing services, self-sufficiency, congregate meal program, weatherization program, Event organiser program, emergency food assistance,  housing counseling, home ownership program, wheels - to work program.  [employer] free for 60 and older at various locations from [employer], Monday-Friday:  [employer], [address] [place], [phone] -[facility], [address], [place] [phone]  -[facility], [address] [place] [phone]  -[address] [place], [phone]  Agency Name: [facility] Address: [phone] [address], [place], [place] [address] Phone: [phone] Website: [email or web address] Service(s) Offered: Home delivered hot, frozen, and emergency  meals. Grocery assistance program which matches  volunteers one-on-one with seniors unable to grocery shop  for themselves. Must be 60 years and older; less than 20  hours of in-home aide service, limited or no driving ability;  live alone or with someone with a disability; live in  [place].  Agency Name: [facility] [employer]) Address: [address] [place], [place] [address] Phone: [phone] Service(s) Offered: Food is served to shut-ins, homeless, elderly, and low income people in the community every Saturday (11:30 am-12:30 pm) and Sunday (12:30 pm-1:30pm). Volunteers also offer help and encouragement in seeking employment,  and spiritual guidance.  Agency Name: Department [facility] Social Services Address: [address] [place], [place] [address] Phone: [phone] Service(s) Offered: Child support services; child welfare services; food stamps; Medicaid; work first family assistance; and aid  with fuel,  rent, food and medicine.  Agency Name: Dietitian Address: [address], [place], [place] Phone: [phone] Website: [email or web address] Services Offered: Monday 10:00am-12:00, 8:00pm-9:00pm, and Friday 10:00am-12:00.  Agency Name: [employer] [facility] Address: [address], [place], [place] [address] Phone: [phone] Website: [email or web address] Service(s) Offered: Serves weekday meals, open from 11:30 am- 1:00 pm., and 6:30-7:30pm, Monday-Wednesday-Friday distributes food 3:30-6pm, Monday-Wednesday-Friday.  Agency Name: [facility] Address: [address] [place], [place] Phone: [phone] Website: [email or web address] Services Offered: Distributes food the 4th Saturday of the month, starting at 8:00 am  Agency Name: [facility] Address: [address], [place], [place] [address] Phone: [phone] Website: [email or web address] Service(s) Offered: Bread of life, weekly food pantry. Open Wednesdays from 10:00am-noon.  Agency Name: [facility] [employer] Address: [address] [place], [place], [place] Phone: [phone] Services Offered: Distributes food 9am-1pm, Monday-Thursday. Call for details.  Agency Name: [facility] Address: [address] [place], [place] [address] Phone: [phone] Website: [email or web address] Service(s) Offered: Games developer. Call for assistance.  Agency Name: [facility] Address: [address] [place], [place] [address] Phone: [phone] Service Offered: Emergency Food Pantry. Call for appointment.  Agency Name: [facility] Address: [address], [place], [place] [address] Phone: [phone] Website: [email or web address] Services Offered: Games developer. Call for details  Agency Name: [facility] Address: [address] [place], [place] Phone:  [phone] Website: [email or web address] Service(s) Offered: Emergency Food Pantry. Call for details.  Agency Name: Holiday representative Address: [address] [place], [place] [address] Phone: [phone] or [phone] Website: [email or web address] Service(s) Offered: Distribute food 9am-11:30 am, Tuesday-Friday, and 1-3:30pm, Monday-Friday. Food pantry Monday-Friday 1pm-3pm, fresh items, Mon.-Wed.-Fri.  Agency Name: [facility] Address: [address] [place], [place] [address] Phone: [phone] Website: [email or web address] Services Offered: Distribute food Tues and Sats from 9:00am-noon.  Closed 1st Saturday of each month. Call for details  Agency Name: [employer] Address: [facility] [address], [place] [address] Phone: [phone]  Services Offered: Delivers meals every Thursday     Transportation Resources for [employer] Area  Agency Name: [facility] Agency Address: [address] [place], [place] [address] Phone: [phone] Email: [email or web address] Website: [email or web address] Service(s) Offered: Housing services, self-sufficiency, congregate meal program, weatherization program, Field seismologist program, emergency food assistance,  housing counseling, home ownership program, wheels-towork program.  Agency Name: [facility] [employer] [phone]) Address: [address] [place], [place] [address] Phone: [phone] Website: [email or web address] Service(s) Offered: Transportation for [employer], subscription and demand response; Dial-a-Ride for citizens 30 years of age or older.  Agency Name: Department of Social Services Address: [address] [place], [place] [address] Phone: [phone] Service(s) Offered: Child support services; child welfare services; food stamps; Medicaid; work first family assistance;  and aid with fuel,  rent, food and medicine, transportation assistance.  Agency Name: Disabled [employer] (DAV) Transportation  Network Phone: [phone] Service(s) Offered: Transports veterans to the [facility] medical center. Call  forty-eight hours in advance and leave the name, telephone  number, date, and time of appointment. Veteran will be  contacted by the driver the day before the appointment to  arrange a pick up point    [employer] ACTA currently provides door to door services. ACTA connects with PART daily for services to [facility]. ACTA also performs contract services to [employer] operates 27 vehicles, all but 3 mini-vans are equipped with lifts for special needs as well as the general public. ACTA drivers are each CDL certified and trained in First Aid and CPR. ACTA was established in 2002 by [employer]. An independent Industrial/product designer. ACTA operates via Cytogeneticist with required local 10% match funding from [employer]. ACTA provides over 80,000 passenger trips each year, including [facility] and [employer] sites.  Call at least by 11 AM one business day prior to needing transportation  [employer].                      [place], [place] [address]     Office Hours: Monday-Friday  8 AM - 5 PM  Do you feel isolated?  The [facility] offers a [employer] that anyone can call toll free at [phone]. The friendship line is available 24 hours a day  [employer] is a Program of All-inclusive Care for the Elderly (PACE). Their mission is to promote and sustain the independence of seniors wishing to remain in the community. They provide seniors with comprehensive long-term health, social, medical and dietary care. Their program is a safe alternative to nursing home care.  [phone]  [facility] Physical Address [facility] [address] [place], [place] [address] Phone: [phone]. . Online zoom yoga class, connect with others without leaving your home [facility] offers [place] dance cardio sessions for individuals via Zoom. This program provides: - Dance fitness activities Please contact program for more information. Servinganyone in need adults 18+ hiv/aids individuals families Call [phone]  Email [email or web address] to get more info  [employer] offers an online [employer] to individuals where they can receive help to focus on their best health. Whether you're a [employer] member or not, the neighborhood center offers  a... Passenger transport manager education  exercise & fitness  community support services  recreation  virtual support Other Servicessupport groups Servinganyone in need adults young adults teens seniors individuals families [email or web address] to get more info  Schedule on their website  The [facility] offers an array of activities for adults age 4 and over. This program provides:- Fitness and health programs- Tech classes- Activity books Main Serviceshealth education  community support services  exercise & fitness  recreation  more education Servingseniors  Call [phone]    For more resources go online to [email or web address] and type in you zipcode

## 2024-03-28 DIAGNOSIS — I483 Typical atrial flutter: Secondary | ICD-10-CM | POA: Diagnosis not present

## 2024-03-28 DIAGNOSIS — I5020 Unspecified systolic (congestive) heart failure: Secondary | ICD-10-CM | POA: Diagnosis not present

## 2024-03-28 DIAGNOSIS — I739 Peripheral vascular disease, unspecified: Secondary | ICD-10-CM | POA: Diagnosis not present

## 2024-03-28 DIAGNOSIS — I2510 Atherosclerotic heart disease of native coronary artery without angina pectoris: Secondary | ICD-10-CM

## 2024-03-28 DIAGNOSIS — I509 Heart failure, unspecified: Secondary | ICD-10-CM | POA: Diagnosis not present

## 2024-03-28 DIAGNOSIS — I998 Other disorder of circulatory system: Secondary | ICD-10-CM | POA: Diagnosis not present

## 2024-03-28 DIAGNOSIS — I25118 Atherosclerotic heart disease of native coronary artery with other forms of angina pectoris: Secondary | ICD-10-CM | POA: Diagnosis not present

## 2024-03-28 DIAGNOSIS — I255 Ischemic cardiomyopathy: Secondary | ICD-10-CM | POA: Diagnosis not present

## 2024-03-28 DIAGNOSIS — I4891 Unspecified atrial fibrillation: Secondary | ICD-10-CM | POA: Diagnosis not present

## 2024-03-28 DIAGNOSIS — R579 Shock, unspecified: Secondary | ICD-10-CM | POA: Diagnosis not present

## 2024-03-28 DIAGNOSIS — I5022 Chronic systolic (congestive) heart failure: Secondary | ICD-10-CM | POA: Diagnosis not present

## 2024-03-28 LAB — CBC
HCT: 39.4 % (ref 39.0–52.0)
Hemoglobin: 12.2 g/dL — ABNORMAL LOW (ref 13.0–17.0)
MCH: 25.4 pg — ABNORMAL LOW (ref 26.0–34.0)
MCHC: 31 g/dL (ref 30.0–36.0)
MCV: 81.9 fL (ref 80.0–100.0)
Platelets: 505 K/uL — ABNORMAL HIGH (ref 150–400)
RBC: 4.81 MIL/uL (ref 4.22–5.81)
RDW: 24.2 % — ABNORMAL HIGH (ref 11.5–15.5)
WBC: 22 K/uL — ABNORMAL HIGH (ref 4.0–10.5)
nRBC: 0 % (ref 0.0–0.2)

## 2024-03-28 LAB — BASIC METABOLIC PANEL WITH GFR
Anion gap: 14 (ref 5–15)
BUN: 37 mg/dL — ABNORMAL HIGH (ref 8–23)
CO2: 17 mmol/L — ABNORMAL LOW (ref 22–32)
Calcium: 8.4 mg/dL — ABNORMAL LOW (ref 8.9–10.3)
Chloride: 105 mmol/L (ref 98–111)
Creatinine, Ser: 2.51 mg/dL — ABNORMAL HIGH (ref 0.61–1.24)
GFR, Estimated: 24 mL/min — ABNORMAL LOW (ref >60)
Glucose, Bld: 95 mg/dL (ref 70–99)
Potassium: 4.5 mmol/L (ref 3.5–5.1)
Sodium: 135 mmol/L (ref 135–145)

## 2024-03-28 LAB — MAGNESIUM: Magnesium: 2 mg/dL (ref 1.7–2.4)

## 2024-03-28 LAB — HEPARIN LEVEL (UNFRACTIONATED): Heparin Unfractionated: 0.2 [IU]/mL — ABNORMAL LOW (ref 0.30–0.70)

## 2024-03-28 MED ORDER — AMIODARONE HCL 200 MG PO TABS: 200.0000 mg | ORAL_TABLET | Freq: Two times a day (BID) | ORAL | Status: DC

## 2024-03-28 MED ORDER — CIPROFLOXACIN HCL 0.3 % OP SOLN
2.0000 [drp] | OPHTHALMIC | Status: DC
  Administered 2024-03-28 – 2024-03-31 (×14): 2 [drp] via OPHTHALMIC
  Filled 2024-03-28: qty 2.5

## 2024-03-28 MED ORDER — POLYETHYLENE GLYCOL 3350 17 G PO PACK: 17.0000 g | PACK | Freq: Every day | ORAL | Status: DC

## 2024-03-28 MED ORDER — HEPARIN BOLUS VIA INFUSION
950.0000 [IU] | Freq: Once | INTRAVENOUS | Status: AC
  Administered 2024-03-28: 01:00:00 950 [IU] via INTRAVENOUS
  Filled 2024-03-28: qty 950

## 2024-03-28 MED ORDER — AMIODARONE HCL 200 MG PO TABS: 200.0000 mg | ORAL_TABLET | Freq: Every day | ORAL | Status: DC

## 2024-03-28 MED ORDER — POLYETHYLENE GLYCOL 3350 17 G PO PACK
17.0000 g | PACK | Freq: Every day | ORAL | Status: DC
  Administered 2024-03-28 – 2024-03-30 (×3): 17 g via ORAL
  Filled 2024-03-28 (×3): qty 1

## 2024-03-28 MED ORDER — AMIODARONE HCL 200 MG PO TABS
400.0000 mg | ORAL_TABLET | Freq: Two times a day (BID) | ORAL | Status: DC
  Administered 2024-03-28 – 2024-03-31 (×7): 400 mg via ORAL
  Filled 2024-03-28 (×7): qty 2

## 2024-03-28 MED ORDER — APIXABAN 2.5 MG PO TABS
2.5000 mg | ORAL_TABLET | Freq: Two times a day (BID) | ORAL | Status: DC
  Administered 2024-03-28 – 2024-03-31 (×7): 2.5 mg via ORAL
  Filled 2024-03-28 (×7): qty 1

## 2024-03-28 NOTE — Progress Notes (Signed)
 [facility]  ROUNDING NOTE   Subjective:   Renal function a bit worse today. Creatinine currently 2.5. Palliative care also seeing the patient.  Lab Results  Component Value Date   CREATININE 2.51 (H) 03/28/2024   CREATININE 2.34 (H) 03/27/2024   CREATININE 2.27 (H) 03/26/2024   12/16 0701 - 12/17 0700 In: 802.3 [P.O.:240; I.V.:562.3] Out: 650 [Urine:650]   Objective:  Vital signs in last 24 hours:  Temp:  [97.7 F (36.5 C)-98.7 F (37.1 C)] 97.7 F (36.5 C) (12/17 1556) Pulse Rate:  [72-103] 72 (12/17 1556) Resp:  [16-18] 16 (12/17 0808) BP: (84-102)/(54-89) 102/54 (12/17 1556) SpO2:  [90 %-97 %] 97 % (12/17 1556)  Weight change:  Filed Weights   03/23/24 0909 03/24/24 1400 03/26/24 1422  Weight: 65.7 kg 64.1 kg 64.1 kg    Intake/Output: I/O last 3 completed shifts: In: 1151.1 [P.O.:240; I.V.:911.1] Out: 1675 [Urine:1675]   Intake/Output this shift:  Total I/O In: 240 [P.O.:240] Out: 600 [Urine:600]  Physical Exam: General: No acute distress  Head: Normocephalic, atraumatic. Moist oral mucosal membranes  Neck: Supple  Lungs:  Clear to auscultation, normal effort  Heart: S1S2 irregular  Abdomen:  Soft, nontender, bowel sounds present, urostomy present  Extremities: No peripheral edema.  Neurologic: Awake, alert, following commands  Skin: No acute rash  Access: No hemodialysis access    Basic Metabolic Panel: Recent Labs  Lab 03/24/24 0346 03/24/24 1431 03/25/24 0307 03/26/24 0356 03/27/24 0441 03/28/24 0454  NA 140  --  138 138 136 135  K 4.5 4.7 4.2 4.3 4.5 4.5  CL 109  --  107 106 104 105  CO2 20*  --  18* 18* 19* 17*  GLUCOSE 99  --  111* 100* 106* 95  BUN 45*  --  45* 38* 33* 37*  CREATININE 2.26*  --  2.37* 2.27* 2.34* 2.51*  CALCIUM  8.8*  --  7.8* 8.0* 8.2* 8.4*  MG  --  2.0 1.9 1.9 1.9 2.0    Liver Function Tests: No results for input(s): AST, ALT, ALKPHOS, BILITOT, PROT, ALBUMIN in the last 168  hours. No results for input(s): LIPASE, AMYLASE in the last 168 hours. No results for input(s): AMMONIA in the last 168 hours.  CBC: Recent Labs  Lab 03/23/24 1654 03/24/24 0346 03/25/24 0307 03/26/24 0356 03/27/24 0441 03/28/24 0454  WBC 23.3* 24.5* 23.4* 22.3* 22.4* 22.0*  NEUTROABS 19.1*  --   --   --   --   --   HGB 16.0 13.6 12.0* 12.4* 12.9* 12.2*  HCT 52.3* 45.9 40.0 40.5 42.6 39.4  MCV 83.7 85.2 83.2 83.7 83.7 81.9  PLT 579* 556* 472* 538* 557* 505*    Cardiac Enzymes: No results for input(s): CKTOTAL, CKMB, CKMBINDEX, TROPONINI in the last 168 hours.  BNP: Invalid input(s): POCBNP  CBG: Recent Labs  Lab 03/24/24 1346 03/26/24 1310  GLUCAP 110* 112*    Microbiology: Results for orders placed or performed during the hospital encounter of 03/23/24  MRSA Next Gen by PCR, Nasal     Status: None   Collection Time: 03/24/24  1:43 PM   Specimen: Nasal Mucosa; Nasal Swab  Result Value Ref Range Status   MRSA by PCR Next Gen NOT DETECTED NOT DETECTED Final    Comment: (NOTE) The GeneXpert MRSA Assay (FDA approved for NASAL specimens only), is one component of a comprehensive MRSA colonization surveillance program. It is not intended to diagnose MRSA infection nor to guide or monitor treatment for MRSA  infections. Test performance is not FDA approved in patients less than 46 years old. Performed at [facility], [address] [place], [place] [address]     Coagulation Studies: No results for input(s): LABPROT, INR in the last 72 hours.   Urinalysis: No results for input(s): COLORURINE, LABSPEC, PHURINE, GLUCOSEU, HGBUR, BILIRUBINUR, KETONESUR, PROTEINUR, UROBILINOGEN, NITRITE, LEUKOCYTESUR in the last 72 hours.  Invalid input(s): APPERANCEUR     Imaging: No results found.    Medications:      [START ON 04/02/2024] amiodarone   200 mg Oral BID   Followed by   NOREEN ON 04/09/2024] amiodarone    200 mg Oral Daily   amiodarone   400 mg Oral BID   apixaban   2.5 mg Oral BID   atorvastatin   80 mg Oral Daily   Chlorhexidine  Gluconate Cloth  6 each Topical Daily   ciprofloxacin   2 drop Left Eye Q4H while awake   clopidogrel   75 mg Oral Daily   ferrous sulfate   325 mg Oral Daily   fluticasone  furoate-vilanterol  1 puff Inhalation Daily   hydroxyurea   500 mg Oral Daily   levothyroxine   50 mcg Oral Q0600   lidocaine   2 patch Transdermal Q24H   naphazoline-pheniramine  1 drop Left Eye QID   polyethylene glycol  17 g Oral Daily   sodium bicarbonate   1,300 mg Oral BID   sodium chloride  flush  3 mL Intravenous Q12H   oxyCODONE  **AND** acetaminophen , acetaminophen , ondansetron  (ZOFRAN ) IV, sodium chloride  flush  Assessment/ Plan:  83 y.o. male with a PMHx of chronic systolic heart failure ejection fraction 30 to 35%, hypertension, hypothyroidism, DVT, paroxysmal atrial fibrillation on Eliquis , bladder cancer status post cystectomy with chronic urostomy, peripheral vascular disease, who was admitted to [facility] on 03/23/2024 for elective left heart catheterization.    1.  Chronic kidney disease stage IV.  Patient close to his baseline creatinine at time of initial evaluation.  He was exposed to contrast 03/23/2024, 03/26/2024.   Update: Renal function slightly worse today with creatinine of 2.5.  He will need continued monitoring of renal parameters.  No indication for dialysis at the moment.   2.  Coronary artery disease/chronic systolic heart failure.  Patient underwent cardiac catheterization on 03/23/2024.  He had 70% stenosis in the left main coronary artery.  Palliative care consulted.  3.  Secondary hyperparathyroidism.  Serum calcium  currently 8.4.  Continue to monitor bone minimal cells and parameters.   4.  Peripheral vascular disease.  Angiogram performed 03/26/2024.  Renal function is slightly worse today given multiple contrast exposures.     LOS: 4 Jerry Mcpherson 12/17/20255:14  PM

## 2024-03-28 NOTE — Progress Notes (Signed)
 Mobility Specialist - Progress Note   03/28/24 0900  Mobility  Activity Ambulated with assistance;Stood at bedside;Dangled on edge of bed;Respositioned in chair  Level of Assistance Contact guard assist, steadying assist  Assistive Device Front wheel walker (IV Stand)  Distance Ambulated (ft) 35 ft  Range of Motion/Exercises All extremities  Activity Response Tolerated well  Mobility visit 1 Mobility  Mobility Specialist Start Time (ACUTE ONLY) 0849  Mobility Specialist Stop Time (ACUTE ONLY) 0915  Mobility Specialist Time Calculation (min) (ACUTE ONLY) 26 min      Pt was supine in bed with the HOB elevated on RA. Pt agreed to mobility. Pt is able today to get to EOB independently with bed features. Pt is able today to STS with minA and 2 [employer]. Pt ambulated well. Pt didn't need recovery break throughout activity. After activity pt repositioned in the recliner with chair alarm on and needs in reach. Pt breakfast arrived upon exit.  Clem Rodes Mobility Specialist 03/28/2024, 9:27 AM

## 2024-03-28 NOTE — Progress Notes (Signed)
 Palliative Care Progress Note, Assessment & Plan   Patient Name: Jerry Mcpherson       Date: 03/28/2024 DOB: Apr 21, 1940  Age: 83 y.o. MRN#: [id number] Attending Physician: Dorinda Drue DASEN, MD Primary Care Physician: Jerry Nancyann BRAVO, MD Admit Date: 03/23/2024  Subjective: Patient is out of bed and sitting in recliner.  He is awake, alert, oriented x 4, acknowledges my presence, and is able to make his wishes known.   HPI: 83 y.o. male  with past medical history significant for recurrent auricles, chronic HFrEF with LVEF 30-35%, HTN, hypothyroidism, DVT, PAF on Eliquis , bladder cancer s/p cystectomy w/ chronic urostomy, PVD and polycythemia vera. Patient presented to [facility] for elective RHC/LHC and coronary angiography.    Findings of cardiac catherization 03/23/2024: Severe left main coronary artery disease with 70% proximal stenosis.  Otherwise, mild-moderate CAD. Normal left and right heart filling pressures. Moderately reduced Fick cardiac output/index (CO 3.1 L/min, CI 1.8 L/min/m).   Pt was referred for admission for: Optimization of heart failure and further evaluation of suspected critical limb ischemia involving the left foot and worsening leukocytosis. May need to consider transfer to [facility] for consideration of LMCA stenting, though given the patient's comorbidities, palliative medical therapy will also need to be considered.  I do not think he is a candidate for CABG. Gentle postcatheterization hydration in the setting of low normal filling pressures and CKD stage IV. Vascular was consulted and recommended to undergo angiogram of LLE for further evaluation and potential revascularization with hope of salvaging limb after assessment concerning for critical L lower limb ischemia.   Pt was  admitted for management of A flutter with RVR, L 3rd and 4th chronic toe ulcer, chronic leukocytosis, CKD IV, chronic HFrEF and polycythemia vera.    Palliative team was consulted to assist with goals of care conversations.   Summary of counseling/coordination of care: Extensive chart review completed prior to meeting patient including labs, vital signs, imaging, progress notes, orders, and available advanced directive documents from current and previous encounters.   After reviewing the patient's chart and assessing the patient at bedside, I spoke with patient in regards to symptom management and goals of care.   Symptoms assessed.  Patient continues to complain of left eye irritation.  He has mild discharge from left eye with no edema noted.  Erythema present on left eye only.  Patient appears to have conjunctivitis.  Discussed that current regimen includes ciprofloxacin  ophthalmic solution.  Patient shares he has not received it today.  Upon review of MAR, cipro  ophthalmic was ordered but expired yesterday. Notified attending and I re-ordered cipro  eye drops to address what appears to be conjunctivitis. Advised patient that I have restarted his eye drops and that it should begin to clear it up over the next day.  I attempted to discuss boundaries and goals of care with patient again.  I referenced our discussion from yesterday which included making his wishes known before a urgent or emergent medical event.  Patient continues to say that he will cross that bridge when he gets to it.  He also shares he wants to get home so that I can get stronger and do the things I  want to do.   I reviewed that if patient wishes to continue to defer discussion and/or declines to vocalize his wishes, the default will remain as full code-full scope.  I clarified with patient that this means patient would be accepting of all offered, available, and appropriate medical interventions to sustain his life-including  ventilatory support/life support.  Patient shares that he does not want to talk about it right now.  No change to plan of care at this time. Patient is resistant to placing boundaries/limits on medical care. He remains politely disinterested in discussing a palliative approach to his plan of care.    Full code and full scope remain.   Symptom burden is low.  Goals are clear.  PMT will step back from daily visits and monitor the patient peripherally.  Please re-engage with PMT if goals change, at patient/family's request, or if patient's health deteriorates during hospitalization.    Physical Exam Vitals reviewed.  Constitutional:      General: He is not in acute distress.    Appearance: He is normal weight.  HENT:     Head: Normocephalic.     Mouth/Throat:     Mouth: Mucous membranes are moist.  Eyes:     Conjunctiva/sclera:     Left eye: Exudate present.     Pupils: Pupils are equal, round, and reactive to light.  Pulmonary:     Effort: Pulmonary effort is normal.  Abdominal:     Palpations: Abdomen is soft.  Neurological:     Mental Status: He is alert.              Recommendations:   Cipro  ophthalmic solution to left eye Q4H while awake Encourage patient to no rub eye in order not to spread infection from left eye to right eye Full code and full scope remain  35 minute visit includes: Detailed review of medical records (labs, imaging, vital signs), medically appropriate exam (mental status, respiratory, cardiac, skin), discussed with treatment team, counseling and educating patient, family and staff, documenting clinical information, medication management and coordination of care.  Lamarr L. Arvid, DNP, FNP-BC Palliative Medicine Team

## 2024-03-28 NOTE — Progress Notes (Signed)
 Progress Note   Patient: Jerry Mcpherson FMW:[id number] DOB: 11/20/1940 DOA: 03/23/2024     4 DOS: the patient was seen and examined on 03/28/2024  Brief Narrative:   83 y.o. male with past medical history of recurrent auricles, chronic HFrEF with LVEF 30-35%, HTN, hypothyroidism, DVT, PAF on Eliquis , bladder cancer status post cystectomy and chronic urostomy, PVD, who has come in today for elective LHC.    12/12: Vascular surgery, cardiology consultation, right/left heart cath  12/13: Transfer to stepdown, intensivist consult due to persistent hypotension, nephrology c/s 12/14: transfer to PCU, Palliative care c/s 12/15: Left lower extremity angiogram 12/16: hypotensive     Assessment & Plan:   Atrial flutter/Fib with RVR - On heparin  drip, needed transfer to [place] for <24 hrs for persistent hypotension - - needed Levophed  for a short period on 12/13 and has been off since then Patient has heparin  drip have been switched to Eliquis  IV amiodarone  have been switched to p.o.  Continue telemetry   Left 3rd and 4th toe chronic ulcer History of PVD - The ulcer appears to be chronic, likely secondary to PVD as pedal pulses on the left significantly diminished compared to the right side, has been followed by wound care center with weekly dressing change. wound appears to be stable and no signs of infection. - vascular surgery-s/p diagnostic angiogram of the left lower extremity - anterior tibial and posterior tibial arteries occlusion with lots of small vessel dz - no revascularization option    Chronic leukocytosis - As per oncology's note from earlier this month, chronic leukocytosis likely secondary to polycythemia vera, for which patient has been taking hydroxyurea . - Patient denied any cough, no urinary symptoms or diarrhea and left fourth toe ulcer appears to be chronic, less likely has a active infection at this point.  Plan to monitor off antibiotics.   Decompensated combined  non-anion gap metabolic acidosis and respiratory alkalosis  CKD stage IV Bladder cancer status post resection with chronic urostomy - Euvolemic, creatinine level stable - Nephrology seen, continue IVF   Syncope - Likely secondary to worsening of chronic HFrEF as well as moderate to severe aortic stenosis - followed by cardiology, - conservative management. - He appears to be euvolemic at this point   CAD with left main stenosis seen on cardiac cath this admission Chronic systolic and diastolic heart failure with EF of 30 to 35% Moderate aortic stenosis Heparin  transition to Eliquis  and Plavix , statin   Polycythemia vera - Hemoglobin stable, continue hydroxyurea  - Patient follow-up with hematology.   Hypothyroidism - Continue Synthroid    Goals of care-palliative care seen,  His overall prognosis is poor     DVT prophylaxis: Eliquis     Code Status: Full code Family Communication: None at bedside Disposition Plan: Possible discharge in 2-3 days depending on clinical condition, cardiology, nephrology evaluation     Consultants:  Nephrology Vascular surgery Cardiology Intensivist Palliative care    Examination:   General exam: Appears calm and comfortable, elderly and chronically ill/cachectic looking Respiratory system: Clear to auscultation. Respiratory effort normal. Cardiovascular system: S1,S2 normal. No M/R/G Gastrointestinal system: Abdomen is soft, benign Central nervous system: Alert and oriented. No focal neurological deficits. Extremities: Symmetric 5 x 5 power. Skin: Left foot in the dressing for small superficial ulceration between 3rd and 4th toe Psychiatry: Judgement and insight appear normal. Mood & affect appropriate.        Data Reviewed:  Vitals:   03/27/24 1918 03/28/24 9556 03/28/24 9191 03/28/24 1556  BP: (!) 84/66 99/71 102/89 (!) 102/54  Pulse: 93 95 (!) 103 72  Resp: 18 17 16    Temp: 97.7 F (36.5 C) 98.7 F (37.1 C) 98.6 F (37 C)  97.7 F (36.5 C)  TempSrc:      SpO2: 95% 90% 92% 97%  Weight:      Height:          Latest Ref Rng & Units 03/28/2024    4:54 AM 03/27/2024    4:41 AM 03/26/2024    3:56 AM  CBC  WBC 4.0 - 10.5 K/uL 22.0  22.4  22.3   Hemoglobin 13.0 - 17.0 g/dL 87.7  87.0  87.5   Hematocrit 39.0 - 52.0 % 39.4  42.6  40.5   Platelets 150 - 400 K/uL 505  557  538        Latest Ref Rng & Units 03/28/2024    4:54 AM 03/27/2024    4:41 AM 03/26/2024    3:56 AM  BMP  Glucose 70 - 99 mg/dL 95  893  899   BUN 8 - 23 mg/dL 37  33  38   Creatinine 0.61 - 1.24 mg/dL 7.48  7.65  7.72   Sodium 135 - 145 mmol/L 135  136  138   Potassium 3.5 - 5.1 mmol/L 4.5  4.5  4.3   Chloride 98 - 111 mmol/L 105  104  106   CO2 22 - 32 mmol/L 17  19  18    Calcium  8.9 - 10.3 mg/dL 8.4  8.2  8.0        Author: Drue ONEIDA Potter, MD 03/28/2024 5:18 PM  For on call review [email or web address].

## 2024-03-28 NOTE — Progress Notes (Signed)
 Rounding Note   Patient Name: Jerry Mcpherson Date of Encounter: 03/28/2024  [facility] Cardiologist: None   Subjective Patient reports that he was able to get up and walk some this morning. He endorses weakness and fatigue. Denies chest pain. He expresses interest in going home.   Scheduled Meds:  atorvastatin   80 mg Oral Daily   Chlorhexidine  Gluconate Cloth  6 each Topical Daily   clopidogrel   75 mg Oral Daily   ferrous sulfate   325 mg Oral Daily   fluticasone  furoate-vilanterol  1 puff Inhalation Daily   hydroxyurea   500 mg Oral Daily   levothyroxine   50 mcg Oral Q0600   lidocaine   2 patch Transdermal Q24H   naphazoline-pheniramine  1 drop Left Eye QID   sodium bicarbonate   1,300 mg Oral BID   sodium chloride  flush  3 mL Intravenous Q12H   Continuous Infusions:  amiodarone  30 mg/hr (03/28/24 0006)   heparin  2,050 Units/hr (03/28/24 0534)   PRN Meds: oxyCODONE  **AND** acetaminophen , acetaminophen , ondansetron  (ZOFRAN ) IV, sodium chloride  flush   Vital Signs  Vitals:   03/27/24 1555 03/27/24 1918 03/28/24 0443 03/28/24 0808  BP: 92/71 (!) 84/66 99/71 102/89  Pulse: 100 93 95 (!) 103  Resp: 18 18 17 16   Temp: 97.9 F (36.6 C) 97.7 F (36.5 C) 98.7 F (37.1 C) 98.6 F (37 C)  TempSrc:      SpO2: 95% 95% 90% 92%  Weight:      Height:        Intake/Output Summary (Last 24 hours) at 03/28/2024 0912 Last data filed at 03/28/2024 0831 Gross per 24 hour  Intake 682.3 ml  Output 1050 ml  Net -367.7 ml      03/26/2024    2:22 PM 03/24/2024    2:00 PM 03/23/2024    9:09 AM  Last 3 Weights  Weight (lbs) 141 lb 5 oz 141 lb 5 oz 144 lb 14.4 oz  Weight (kg) 64.1 kg 64.1 kg 65.726 kg      Telemetry Conversion from atrial fibrillation to sinus rhythm this morning around 8AM - Personally Reviewed  Physical Exam  GEN: No acute distress.   Neck: No JVD Cardiac: RRR, II/VI systolic murmur, no rubs or gallops.  Respiratory: Clear to auscultation  bilaterally GI: Soft, nontender, non-distended  MS: No edema; No deformity. Neuro:  Nonfocal  Psych: Normal affect   Labs High Sensitivity Troponin:  No results for input(s): TROPONINIHS in the last 720 hours. No results for input(s): TRNPT in the last 720 hours.     Chemistry Recent Labs  Lab 03/26/24 0356 03/27/24 0441 03/28/24 0454  NA 138 136 135  K 4.3 4.5 4.5  CL 106 104 105  CO2 18* 19* 17*  GLUCOSE 100* 106* 95  BUN 38* 33* 37*  CREATININE 2.27* 2.34* 2.51*  CALCIUM  8.0* 8.2* 8.4*  MG 1.9 1.9 2.0  GFRNONAA 28* 27* 24*  ANIONGAP 14 14 14     Lipids No results for input(s): CHOL, TRIG, HDL, LABVLDL, LDLCALC, CHOLHDL in the last 168 hours.  Hematology Recent Labs  Lab 03/26/24 0356 03/27/24 0441 03/28/24 0454  WBC 22.3* 22.4* 22.0*  RBC 4.84 5.09 4.81  HGB 12.4* 12.9* 12.2*  HCT 40.5 42.6 39.4  MCV 83.7 83.7 81.9  MCH 25.6* 25.3* 25.4*  MCHC 30.6 30.3 31.0  RDW 23.3* 24.2* 24.2*  PLT 538* 557* 505*   Thyroid   Recent Labs  Lab 03/24/24 1405  TSH 9.610*    BNPNo results for  input(s): BNP, PROBNP in the last 168 hours.  DDimer No results for input(s): DDIMER in the last 168 hours.   Radiology  PERIPHERAL VASCULAR CATHETERIZATION Result Date: 03/26/2024 See surgical note for result.   Cardiac Studies  R/LHC 03/23/2024:   Ost LM to Mid LM lesion is 70% stenosed.   Mid RCA to Dist RCA lesion is 35% stenosed.   LV end diastolic pressure is normal.   There is moderate aortic valve stenosis.   Conclusions: Severe left main coronary artery disease with 70% proximal stenosis.  Otherwise, mild-moderate coronary artery disease. Normal left and right heart filling pressures. Moderately reduced Fick cardiac output/index (CO 3.1 L/min, CI 1.8 L/min/m). Moderate aortic valve stenosis (peak-to-peak gradient 22 mmHg).   Recommendations: Admit for optimization of heart failure and further evaluation of suspected critical limb ischemia  involving the left foot and worsening leukocytosis. May need to consider transfer to [facility] for consideration of LMCA stenting, though given the patient's comorbidities, palliative medical therapy will also need to be considered.  I do not think he is a candidate for CABG. Gentle postcatheterization hydration in the setting of low normal filling pressures and CKD stage IV. __________   2D echo 02/03/2024: 1. Left ventricular ejection fraction, by estimation, is 30 to 35%. The  left ventricle has moderately decreased function. Left ventricular  endocardial border not optimally defined to evaluate regional wall motion.  There is mild concentric left  ventricular hypertrophy. Left ventricular diastolic parameters are  consistent with Grade I diastolic dysfunction (impaired relaxation). The  average left ventricular global longitudinal strain is -8.4 %. The global  longitudinal strain is abnormal.   2. Right ventricular systolic function is normal. The right ventricular  size is normal.   3. Mild to moderate mitral valve regurgitation. No evidence of mitral  stenosis.   4. There is severe calcifcation of the aortic valve. Aortic valve  regurgitation is trivial. Moderate low-flow, low-gradient stenosis. Aortic  valve mean gradient measures 11.0 mmHg. Aortic valve Vmax measures 2.29  m/s. AVA 1.49, DVI 0.36.   Comparison(s): A prior study was performed on 12/17/2023. No significant  change from prior study.   Patient Profile   83 y.o. 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  Assessment & Plan   Atrial fibrillation/flutter with RVR - Briefly required pressors while in flutter earlier this admission - Intermittent atrial fibrillation/flutter throughout admission - Rebolused with amiodarone  yesterday with conversion back to sinus rhythm this morning - Will transition to oral amiodarone  load - CHA2DS2-VASc 7, can transition from heparin  to DOAC if no further procedure planned this admission  CAD with left main stenosis - Originally presenting for outpatient cardiac catheterization 12/12 which revealed severe left main coronary artery disease with 70% proximal stenosis - Ongoing discussion with patient regarding high risk procedure to intervene on left main stenosis. Not felt to be a good candidate for PCI or  CABG in the setting of advanced age and other comorbidities.  - Denies chest pain - Continue clopidogrel  and statin therapy; DOAC in place of ASA - Ongoing discussions with palliative care  Chronic systolic and diastolic heart failure - EF 30 to 35% with CAD detailed above.  Normal right and left heart filling pressures on cath. - Cardiac index is 1.8 - Appears relatively euvolemic on exam.  Improvement in dyspnea. - Prior angioedema with ARB.  Continue to avoid ACE I, ARB, and ARNI - GDMT limited by hypotension and renal dysfunction  Aortic stenosis - Echo 12/2023 with moderate to severe aortic stenosis, mean gradient 19.8 mmHg and valve area 1.39 cm - Moderate by cath on 12/12 with peak to peak gradient 22 mmHg  PAD Critical limb ischemia - Nonhealing ulceration of the left foot - Evaluated by vascular surgery with angiogram done 12/15.  No revascularization options.  CKD stage IV - Creatinine overall stable at 2.51 - Will  need to monitor volume status closely - Nephrology following   For questions or updates, please contact [facility] Please consult [email or web address] for contact info under       Signed, Lesley LITTIE Maffucci, PA-C  03/28/2024, 9:12 AM

## 2024-03-28 NOTE — Plan of Care (Signed)

## 2024-03-29 ENCOUNTER — Other Ambulatory Visit: Payer: Self-pay | Admitting: Family Medicine

## 2024-03-29 DIAGNOSIS — I5020 Unspecified systolic (congestive) heart failure: Secondary | ICD-10-CM | POA: Diagnosis not present

## 2024-03-29 DIAGNOSIS — I4891 Unspecified atrial fibrillation: Secondary | ICD-10-CM | POA: Diagnosis not present

## 2024-03-29 DIAGNOSIS — I25118 Atherosclerotic heart disease of native coronary artery with other forms of angina pectoris: Secondary | ICD-10-CM | POA: Diagnosis not present

## 2024-03-29 DIAGNOSIS — I5022 Chronic systolic (congestive) heart failure: Secondary | ICD-10-CM | POA: Diagnosis not present

## 2024-03-29 DIAGNOSIS — I132 Hypertensive heart and chronic kidney disease with heart failure and with stage 5 chronic kidney disease, or end stage renal disease: Secondary | ICD-10-CM | POA: Diagnosis not present

## 2024-03-29 DIAGNOSIS — G589 Mononeuropathy, unspecified: Secondary | ICD-10-CM

## 2024-03-29 DIAGNOSIS — I483 Typical atrial flutter: Secondary | ICD-10-CM | POA: Diagnosis not present

## 2024-03-29 DIAGNOSIS — M5410 Radiculopathy, site unspecified: Secondary | ICD-10-CM

## 2024-03-29 DIAGNOSIS — R579 Shock, unspecified: Secondary | ICD-10-CM | POA: Diagnosis not present

## 2024-03-29 DIAGNOSIS — I998 Other disorder of circulatory system: Secondary | ICD-10-CM | POA: Diagnosis not present

## 2024-03-29 DIAGNOSIS — I255 Ischemic cardiomyopathy: Secondary | ICD-10-CM | POA: Diagnosis not present

## 2024-03-29 LAB — CBC WITH DIFFERENTIAL/PLATELET
Abs Immature Granulocytes: 0.23 K/uL — ABNORMAL HIGH (ref 0.00–0.07)
Basophils Absolute: 0.2 K/uL — ABNORMAL HIGH (ref 0.0–0.1)
Basophils Relative: 1 %
Eosinophils Absolute: 0.8 K/uL — ABNORMAL HIGH (ref 0.0–0.5)
Eosinophils Relative: 4 %
HCT: 37.6 % — ABNORMAL LOW (ref 39.0–52.0)
Hemoglobin: 11.2 g/dL — ABNORMAL LOW (ref 13.0–17.0)
Immature Granulocytes: 1 %
Lymphocytes Relative: 7 %
Lymphs Abs: 1.2 K/uL (ref 0.7–4.0)
MCH: 25.2 pg — ABNORMAL LOW (ref 26.0–34.0)
MCHC: 29.8 g/dL — ABNORMAL LOW (ref 30.0–36.0)
MCV: 84.5 fL (ref 80.0–100.0)
Monocytes Absolute: 0.9 K/uL (ref 0.1–1.0)
Monocytes Relative: 5 %
Neutro Abs: 14.9 K/uL — ABNORMAL HIGH (ref 1.7–7.7)
Neutrophils Relative %: 82 %
Platelets: 458 K/uL — ABNORMAL HIGH (ref 150–400)
RBC: 4.45 MIL/uL (ref 4.22–5.81)
RDW: 24.7 % — ABNORMAL HIGH (ref 11.5–15.5)
Smear Review: NORMAL
WBC: 18.2 K/uL — ABNORMAL HIGH (ref 4.0–10.5)
nRBC: 0 % (ref 0.0–0.2)

## 2024-03-29 LAB — BASIC METABOLIC PANEL WITH GFR
Anion gap: 13 (ref 5–15)
BUN: 44 mg/dL — ABNORMAL HIGH (ref 8–23)
CO2: 20 mmol/L — ABNORMAL LOW (ref 22–32)
Calcium: 8.1 mg/dL — ABNORMAL LOW (ref 8.9–10.3)
Chloride: 103 mmol/L (ref 98–111)
Creatinine, Ser: 2.99 mg/dL — ABNORMAL HIGH (ref 0.61–1.24)
GFR, Estimated: 20 mL/min — ABNORMAL LOW (ref >60)
Glucose, Bld: 94 mg/dL (ref 70–99)
Potassium: 4.3 mmol/L (ref 3.5–5.1)
Sodium: 136 mmol/L (ref 135–145)

## 2024-03-29 LAB — MAGNESIUM: Magnesium: 2 mg/dL (ref 1.7–2.4)

## 2024-03-29 NOTE — Telephone Encounter (Signed)
 Copied from CRM [id number]. Topic: Clinical - Medication Question >> Mar 29, 2024  1:41 PM Avram MATSU wrote: Reason for CRM: patient stated he is in [place] for Heart. Patient has been seeing a kidney doctor.  Patient is requesting a refill for his oxyCODONE -acetaminophen  (PERCOCET) 10-325 MG tablet [[id number]]  [id number]

## 2024-03-29 NOTE — Progress Notes (Signed)
 wound dressing offered pt decline , stating he will do it later.

## 2024-03-29 NOTE — TOC Initial Note (Addendum)
 Transition of Care [facility]) - Initial/Assessment Note    Patient Details  Name: Jerry Mcpherson MRN: [id number] Date of Birth: 05/28/1940  Transition of [facility] CM/SW Contact:    Lauraine JAYSON Carpen, LCSW Phone Number: 03/29/2024, 12:35 PM  Clinical Narrative:   Readmission prevention screen complete. CSW met with patient. No family at bedside. CSW introduced role and explained that discharge planning would be discussed. PCP is Nancyann Perry, MD. Patient uses [facility] to get to appointments. [facility] [place] and [facility]. No issues affording medications. Patient lives home alone. He is active with [facility]. Left voicemail for liaison to see which services he is getting. Patient has a walker at home but does not use it. No further concerns. CSW will continue to follow patient for support and facilitate return home once stable. Nephew will transport him home at discharge.               12:42 pm: Patient is active with [facility] for nursing.  Expected Discharge Plan: Home w Home Health Services Barriers to Discharge: Continued Medical Work up   Patient Goals and CMS Choice            Expected Discharge Plan and Services     Post Acute Care Choice: Resumption of Svcs/PTA Provider Living arrangements for the past 2 months: Single Family Home                                      Prior Living Arrangements/Services Living arrangements for the past 2 months: Single Family Home Lives with:: Self Patient language and need for interpreter reviewed:: Yes Do you feel safe going back to the place where you live?: Yes      Need for Family Participation in Patient Care: Yes (Comment)     Criminal Activity/Legal Involvement Pertinent to Current Situation/Hospitalization: No - Comment as needed  Activities of Daily Living   ADL Screening (condition at time of admission) Independently performs ADLs?: Yes (appropriate for developmental age) Is the patient deaf or  have difficulty hearing?: Yes Does the patient have difficulty seeing, even when wearing glasses/contacts?: No Does the patient have difficulty concentrating, remembering, or making decisions?: No  Permission Sought/Granted Permission sought to share information with : Facility Industrial/product Designer granted to share information with : Yes, Verbal Permission Granted     Permission granted to share info w AGENCY: [facility]        Emotional Assessment Appearance:: Appears stated age Attitude/Demeanor/Rapport: Engaged, Gracious Affect (typically observed): Accepting, Appropriate, Calm, Pleasant Orientation: : Oriented to Self, Oriented to Place, Oriented to  Time, Oriented to Situation Alcohol / Substance Use: Not Applicable Psych Involvement: No (comment)  Admission diagnosis:  Heart failure, unspecified HF chronicity, unspecified heart failure type (HCC) [I50.9] Chronic HFrEF (heart failure with reduced ejection fraction) (HCC) [I50.22] Atrial flutter (HCC) [I48.92] Patient Active Problem List   Diagnosis Date Noted   Hypertensive heart and chronic kidney disease with heart failure and with stage 5 chronic kidney disease, or end stage renal disease (HCC) 03/29/2024   Ischemic cardiomyopathy 03/28/2024   Ischemic leg 03/28/2024   Coronary artery disease of native artery of native heart with stable angina pectoris 03/28/2024   Shock circulatory (HCC) 03/24/2024   Atrial fibrillation with RVR (HCC) 03/24/2024   AKI (acute kidney injury) 03/24/2024   Pressure injury of skin 03/24/2024  Chronic HFrEF (heart failure with reduced ejection fraction) (HCC) 03/23/2024   HFrEF (heart failure with reduced ejection fraction) (HCC) 02/06/2024   Syncope 12/17/2023   Syncope and collapse 12/16/2023   UTI (urinary tract infection) 12/16/2023   Systolic murmur 04/01/2023   Wound of gluteal cleft 09/09/2022   PAD (peripheral artery disease) 07/09/2022   Pilonidal cyst  08/18/2021   Atrial flutter (HCC) 07/09/2021   Homeless 02/18/2021   Hypothyroidism 01/25/2021   Acute deep vein thrombosis (DVT) of right femoral vein (HCC) 10/28/2020   Malignant neoplasm of overlapping sites of bladder (HCC) 07/24/2020   Bladder cancer (HCC) 01/30/2020   Polycythemia vera (HCC) 05/15/2018   Erectile dysfunction 12/10/2014   Primary hypertension 12/10/2014   Arthralgia of left lower leg 12/10/2014   Radiculopathy 12/10/2014   Squamous cell carcinoma 12/10/2014   Vertigo 12/10/2014   PCP:  [name], MD Pharmacy:   [facility] - [place], [place] - [address] [place] [address] Phone: [phone] Fax: [phone]  [id number] [place], [place] - [address] [place] [address] Phone: [phone] Fax: [phone]     Social Drivers of Health (SDOH) Social History: SDOH Screenings   Food Insecurity: Food Insecurity Present (03/23/2024)  Housing: Low Risk (03/23/2024)  Transportation Needs: Unmet Transportation Needs (03/23/2024)  Utilities: Not At Risk (03/23/2024)  Alcohol Screen: Low Risk (08/10/2023)  Depression (PHQ2-9): Low Risk (01/17/2024)  Financial Resource Strain: Low Risk  (09/16/2023)   Received from [facility]  Recent Concern: Financial Resource Strain - Medium Risk (08/10/2023)  Physical Activity: Inactive (08/10/2023)  Social Connections: Socially Isolated (03/23/2024)  Stress: No Stress Concern Present (08/10/2023)  Tobacco Use: Medium Risk (03/23/2024)  Health Literacy: Adequate Health Literacy (08/10/2023)   SDOH Interventions: Food Insecurity Interventions: Inpatient TOC (Resources added to AVS.) Transportation Interventions: Inpatient TOC (Resources added to AVS.) Social Connections Interventions: Inpatient TOC (Resources added to AVS.)   Readmission Risk Interventions    03/29/2024   12:33 PM  Readmission Risk Prevention Plan  Transportation Screening Complete   PCP or Specialist Appt within 3-5 Days Complete  HRI or Home Care Consult Complete  Social Work Consult for Recovery Care Planning/Counseling Complete  Palliative Care Screening Not Applicable  Medication Review Oceanographer) Complete

## 2024-03-29 NOTE — Progress Notes (Signed)
 Progress Note   Patient: Jerry Mcpherson FMW:[id number] DOB: 03/11/1941 DOA: 03/23/2024     5 DOS: the patient was seen and examined on 03/29/2024     Brief Narrative:   83 y.o. male with past medical history of recurrent auricles, chronic HFrEF with LVEF 30-35%, HTN, hypothyroidism, DVT, PAF on Eliquis , bladder cancer status post cystectomy and chronic urostomy, PVD, who has come in today for elective LHC.    12/12: Vascular surgery, cardiology consultation, right/left heart cath  12/13: Transfer to stepdown, intensivist consult due to persistent hypotension, nephrology c/s 12/14: transfer to PCU, Palliative care c/s 12/15: Left lower extremity angiogram 12/16: hypotensive     Assessment & Plan:   Atrial flutter/Fib with RVR - On heparin  drip, needed transfer to [place] for <24 hrs for persistent hypotension - - needed Levophed  for a short period on 12/13 and has been off since then Patient has heparin  drip have been switched to Eliquis  Continue amiodarone  orally   Left 3rd and 4th toe chronic ulcer History of PVD - The ulcer appears to be chronic, likely secondary to PVD as pedal pulses on the left significantly diminished compared to the right side, has been followed by wound care center with weekly dressing change. wound appears to be stable and no signs of infection. - vascular surgery-s/p diagnostic angiogram of the left lower extremity - anterior tibial and posterior tibial arteries occlusion with lots of small vessel dz - no revascularization option    Chronic leukocytosis - As per oncology's note from earlier this month, chronic leukocytosis likely secondary to polycythemia vera, for which patient has been taking hydroxyurea . - Patient denied any cough, no urinary symptoms or diarrhea and left fourth toe ulcer appears to be chronic, less likely has a active infection at this point.  Plan to monitor off antibiotics.   Decompensated combined non-anion gap metabolic acidosis  and respiratory alkalosis  AKI on CKD stage IV Bladder cancer status post resection with chronic urostomy - Euvolemic, creatinine level stable - Nephrology seen Continue IV fluid as recommended by nephrology Lasix  on hold   Syncope - Likely secondary to worsening of chronic HFrEF as well as moderate to severe aortic stenosis - followed by cardiology, - conservative management. - He appears to be euvolemic at this point   CAD with left main stenosis seen on cardiac cath this admission Chronic systolic and diastolic heart failure with EF of 30 to 35% Moderate aortic stenosis Heparin  transition to Eliquis  and Plavix , statin   Polycythemia vera - Hemoglobin stable, continue hydroxyurea  - Patient follow-up with hematology.   Hypothyroidism - Continue Synthroid    Goals of care-palliative care seen,  His overall prognosis is poor     DVT prophylaxis: Eliquis      Code Status: Full code Family Communication: None at bedside Disposition Plan: Possible discharge in 2-3 days depending on clinical condition, cardiology, nephrology evaluation     Consultants:  Nephrology Vascular surgery Cardiology Intensivist Palliative care     Examination:   General exam: Appears calm and comfortable, elderly and chronically ill/cachectic looking Respiratory system: Clear to auscultation. Respiratory effort normal. Cardiovascular system: S1,S2 normal. No M/R/G Gastrointestinal system: Abdomen is soft, benign Central nervous system: Alert and oriented. No focal neurological deficits. Extremities: Symmetric 5 x 5 power. Skin: Left foot in the dressing for small superficial ulceration between 3rd and 4th toe Psychiatry: Judgement and insight appear normal. Mood & affect appropriate.    Subjective: Renal function worsening today Lasix  on hold Denies chest pain  nausea We appreciate the input of cardiologist and nephrology   Data Reviewed:      Latest Ref Rng & Units 03/29/2024    4:02  AM 03/28/2024    4:54 AM 03/27/2024    4:41 AM  BMP  Glucose 70 - 99 mg/dL 94  95  893   BUN 8 - 23 mg/dL 44  37  33   Creatinine 0.61 - 1.24 mg/dL 7.00  7.48  7.65   Sodium 135 - 145 mmol/L 136  135  136   Potassium 3.5 - 5.1 mmol/L 4.3  4.5  4.5   Chloride 98 - 111 mmol/L 103  105  104   CO2 22 - 32 mmol/L 20  17  19    Calcium  8.9 - 10.3 mg/dL 8.1  8.4  8.2      Vitals:   03/29/24 0530 03/29/24 0901 03/29/24 1220 03/29/24 1729  BP: (!) 95/53 103/65 (!) 96/50 112/64  Pulse: 66 80 71 69  Resp: 16  16   Temp: 98.4 F (36.9 C)  98.1 F (36.7 C) 98 F (36.7 C)  TempSrc:    Oral  SpO2: 95% 93% 96% 96%  Weight:      Height:         Author: Drue ONEIDA Potter, MD 03/29/2024 6:02 PM  For on call review [email or web address].

## 2024-03-29 NOTE — Progress Notes (Signed)
 Progress Note  Patient Name: Jerry Mcpherson Date of Encounter: 03/29/2024 [facility] Cardiologist: None   Interval Summary    Patient remains in NSR. He denies chest pain. He reports left leg pain overnight.   Vital Signs Vitals:   03/28/24 2139 03/29/24 0008 03/29/24 0454 03/29/24 0530  BP: 123/71 (!) 95/55 99/61 (!) 95/53  Pulse: (!) 51 72 69 66  Resp: 20 20 18 16   Temp: 97.8 F (36.6 C) 98.6 F (37 C)  98.4 F (36.9 C)  TempSrc:      SpO2: 91% 92% 94% 95%  Weight:      Height:        Intake/Output Summary (Last 24 hours) at 03/29/2024 0804 Last data filed at 03/29/2024 0600 Gross per 24 hour  Intake 360 ml  Output 1350 ml  Net -990 ml      03/26/2024    2:22 PM 03/24/2024    2:00 PM 03/23/2024    9:09 AM  Last 3 Weights  Weight (lbs) 141 lb 5 oz 141 lb 5 oz 144 lb 14.4 oz  Weight (kg) 64.1 kg 64.1 kg 65.726 kg      Telemetry/ECG  NSR HR 70s - Personally Reviewed  Physical Exam  GEN: No acute distress.   Neck: No JVD Cardiac: RRR, no murmurs, rubs, or gallops.  Respiratory: Clear to auscultation bilaterally. GI: Soft, nontender, non-distended  MS: No edema   Cardiac Studies   R/LHC 03/23/2024:   Ost LM to Mid LM lesion is 70% stenosed.   Mid RCA to Dist RCA lesion is 35% stenosed.   LV end diastolic pressure is normal.   There is moderate aortic valve stenosis.   Conclusions: Severe left main coronary artery disease with 70% proximal stenosis.  Otherwise, mild-moderate coronary artery disease. Normal left and right heart filling pressures. Moderately reduced Fick cardiac output/index (CO 3.1 L/min, CI 1.8 L/min/m). Moderate aortic valve stenosis (peak-to-peak gradient 22 mmHg).   Recommendations: Admit for optimization of heart failure and further evaluation of suspected critical limb ischemia involving the left foot and worsening leukocytosis. May need to consider transfer to [facility] for consideration of LMCA stenting,  though given the patient's comorbidities, palliative medical therapy will also need to be considered.  I do not think he is a candidate for CABG. Gentle postcatheterization hydration in the setting of low normal filling pressures and CKD stage IV. __________   2D echo 02/03/2024: 1. Left ventricular ejection fraction, by estimation, is 30 to 35%. The  left ventricle has moderately decreased function. Left ventricular  endocardial border not optimally defined to evaluate regional wall motion.  There is mild concentric left  ventricular hypertrophy. Left ventricular diastolic parameters are  consistent with Grade I diastolic dysfunction (impaired relaxation). The  average left ventricular global longitudinal strain is -8.4 %. The global  longitudinal strain is abnormal.   2. Right ventricular systolic function is normal. The right ventricular  size is normal.   3. Mild to moderate mitral valve regurgitation. No evidence of mitral  stenosis.   4. There is severe calcifcation of the aortic valve. Aortic valve  regurgitation is trivial. Moderate low-flow, low-gradient stenosis. Aortic  valve mean gradient measures 11.0 mmHg. Aortic valve Vmax measures 2.29  m/s. AVA 1.49, DVI 0.36.   Comparison(s): A prior study was performed on 12/17/2023. No significant  change from prior study.    Patient Profile   83 y.o. 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  Assessment & Plan   Afib/flutter with RVR -Briefly required pressors while in flutter -Intermittent afib throughout admissio -Re-bolused amiodarone  with conversion back to NSR -Remains in NSR - continue oral amiodarone  load - no BB given hypotension -CHADSASC of 7. Continue Eliquis  2.5mg BID  CAD with left main stenosis -Originally presenting for outpatient cardiac cath 12/12 which showed severe letf main CAD with 7% proximal stenosis -Ongoing discussion with patient reagarding high risk procedure to intervene on left main stenosis. Not felt to be a food candidate for PCI or CABG in the setting of advanced age and comorbidities -No chest pain reported -Continue Plavix  and statin therapy -No ASA given DOAC  Chronic systolic and diastolic heart failure - Echo showed LVEF 30-35% with CAD - normal right and left heart filling pressures on cath - appears euvolemic - BB held for hypotension. H/o angioedema on ARB. Other GDMT held in the setting of renal dysfunction and hypotension - lasix  held for worsening kidney function  Aortic stenosis -Echo 12/2023 showed moderate to severe AS, mean gradient 19.49mmHg and valve area 1.39cm2 -Moderate by cath 12/12 with peak gradient  PAD Critical limb ischemia - nonhealing ulceration of the left foot - evaluated by vascular surgery with angiogram done 12/15. No revascularization options  CKD stage IV - baseline SCR ~ 2.5 - nephrology following, appreciate recs for diuretic   For questions or updates, please contact [facility] Please consult [email or web address] for contact info under         Signed, Jaheem Hedgepath VEAR Fishman, PA-C

## 2024-03-29 NOTE — Progress Notes (Signed)
 Mobility Specialist - Progress Note    03/29/24 1100  Mobility  Activity Ambulated with assistance;Stood at bedside;Dangled on edge of bed;Respositioned in chair  Level of Assistance Contact guard assist, steadying assist  Assistive Device Front wheel walker  Distance Ambulated (ft) 6 ft  Range of Motion/Exercises All extremities;Active  Activity Response Tolerated well  Mobility visit 1 Mobility  Mobility Specialist Start Time (ACUTE ONLY) 1033  Mobility Specialist Stop Time (ACUTE ONLY) 1048  Mobility Specialist Time Calculation (min) (ACUTE ONLY) 15 min   Pt was supine in bed with the HOB elevated on RA upon entry. Pt agreed to mobility. Pt was able today to get to the EOB independently with bed features. Pt is able today to STS with 2 WW. Pt was able to ambulate a few steps then requested to be repositioned in the recliner till a later time. Pt is in the recliner with needs in reach and chair alarm on.  Clem Rodes Mobility Specialist 03/29/2024, 11:47 AM

## 2024-03-29 NOTE — Plan of Care (Signed)

## 2024-03-29 NOTE — Progress Notes (Signed)
 [facility]  ROUNDING NOTE   Subjective:   Patient seen at bedside.  Renal function worse.  Cr up to 2.99.   Started back on IVFs.   Lab Results  Component Value Date   CREATININE 2.99 (H) 03/29/2024   CREATININE 2.51 (H) 03/28/2024   CREATININE 2.34 (H) 03/27/2024   12/17 0701 - 12/18 0700 In: 360 [P.O.:360] Out: 1350 [Urine:1350]   Objective:  Vital signs in last 24 hours:  Temp:  [97.8 F (36.6 C)-98.6 F (37 C)] 98 F (36.7 C) (12/18 1729) Pulse Rate:  [51-80] 69 (12/18 1729) Resp:  [16-20] 16 (12/18 1220) BP: (95-123)/(50-71) 112/64 (12/18 1729) SpO2:  [91 %-96 %] 96 % (12/18 1729)  Weight change:  Filed Weights   03/23/24 0909 03/24/24 1400 03/26/24 1422  Weight: 65.7 kg 64.1 kg 64.1 kg    Intake/Output: I/O last 3 completed shifts: In: 360 [P.O.:360] Out: 1550 [Urine:1550]   Intake/Output this shift:  Total I/O In: 240 [P.O.:240] Out: -   Physical Exam: General: No acute distress  Head: Normocephalic, atraumatic. Moist oral mucosal membranes  Neck: Supple  Lungs:  Clear to auscultation, normal effort  Heart: S1S2 irregular  Abdomen:  Soft, nontender, bowel sounds present, urostomy present  Extremities: No peripheral edema.  Neurologic: Awake, alert, following commands  Skin: No acute rash  Access: No hemodialysis access    Basic Metabolic Panel: Recent Labs  Lab 03/25/24 0307 03/26/24 0356 03/27/24 0441 03/28/24 0454 03/29/24 0402  NA 138 138 136 135 136  K 4.2 4.3 4.5 4.5 4.3  CL 107 106 104 105 103  CO2 18* 18* 19* 17* 20*  GLUCOSE 111* 100* 106* 95 94  BUN 45* 38* 33* 37* 44*  CREATININE 2.37* 2.27* 2.34* 2.51* 2.99*  CALCIUM  7.8* 8.0* 8.2* 8.4* 8.1*  MG 1.9 1.9 1.9 2.0 2.0    Liver Function Tests: No results for input(s): AST, ALT, ALKPHOS, BILITOT, PROT, ALBUMIN in the last 168 hours. No results for input(s): LIPASE, AMYLASE in the last 168 hours. No results for input(s): AMMONIA in the last  168 hours.  CBC: Recent Labs  Lab 03/23/24 1654 03/24/24 0346 03/25/24 0307 03/26/24 0356 03/27/24 0441 03/28/24 0454 03/29/24 0402  WBC 23.3*   < > 23.4* 22.3* 22.4* 22.0* 18.2*  NEUTROABS 19.1*  --   --   --   --   --  14.9*  HGB 16.0   < > 12.0* 12.4* 12.9* 12.2* 11.2*  HCT 52.3*   < > 40.0 40.5 42.6 39.4 37.6*  MCV 83.7   < > 83.2 83.7 83.7 81.9 84.5  PLT 579*   < > 472* 538* 557* 505* 458*   < > = values in this interval not displayed.    Cardiac Enzymes: No results for input(s): CKTOTAL, CKMB, CKMBINDEX, TROPONINI in the last 168 hours.  BNP: Invalid input(s): POCBNP  CBG: Recent Labs  Lab 03/24/24 1346 03/26/24 1310  GLUCAP 110* 112*    Microbiology: Results for orders placed or performed during the hospital encounter of 03/23/24  MRSA Next Gen by PCR, Nasal     Status: None   Collection Time: 03/24/24  1:43 PM   Specimen: Nasal Mucosa; Nasal Swab  Result Value Ref Range Status   MRSA by PCR Next Gen NOT DETECTED NOT DETECTED Final    Comment: (NOTE) The GeneXpert MRSA Assay (FDA approved for NASAL specimens only), is one component of a comprehensive MRSA colonization surveillance program. It is not intended to diagnose  MRSA infection nor to guide or monitor treatment for MRSA infections. Test performance is not FDA approved in patients less than 59 years old. Performed at [facility], [address] [place], [place] [address]     Coagulation Studies: No results for input(s): LABPROT, INR in the last 72 hours.   Urinalysis: No results for input(s): COLORURINE, LABSPEC, PHURINE, GLUCOSEU, HGBUR, BILIRUBINUR, KETONESUR, PROTEINUR, UROBILINOGEN, NITRITE, LEUKOCYTESUR in the last 72 hours.  Invalid input(s): APPERANCEUR     Imaging: No results found.    Medications:    sodium chloride  40 mL/hr at 03/29/24 0959     [START ON 04/02/2024] amiodarone   200 mg Oral BID   Followed by   NOREEN ON  04/09/2024] amiodarone   200 mg Oral Daily   amiodarone   400 mg Oral BID   apixaban   2.5 mg Oral BID   atorvastatin   80 mg Oral Daily   Chlorhexidine  Gluconate Cloth  6 each Topical Daily   ciprofloxacin   2 drop Left Eye Q4H while awake   clopidogrel   75 mg Oral Daily   ferrous sulfate   325 mg Oral Daily   fluticasone  furoate-vilanterol  1 puff Inhalation Daily   hydroxyurea   500 mg Oral Daily   levothyroxine   50 mcg Oral Q0600   lidocaine   2 patch Transdermal Q24H   naphazoline-pheniramine  1 drop Left Eye QID   polyethylene glycol  17 g Oral Daily   sodium bicarbonate   1,300 mg Oral BID   sodium chloride  flush  3 mL Intravenous Q12H   oxyCODONE  **AND** acetaminophen , acetaminophen , ondansetron  (ZOFRAN ) IV, sodium chloride  flush  Assessment/ Plan:  83 y.o. male with a PMHx of chronic systolic heart failure ejection fraction 30 to 35%, hypertension, hypothyroidism, DVT, paroxysmal atrial fibrillation on Eliquis , bladder cancer status post cystectomy with chronic urostomy, peripheral vascular disease, who was admitted to [facility] on 03/23/2024 for elective left heart catheterization.    1.  Chronic kidney disease stage IV.  Patient close to his baseline creatinine at time of initial evaluation.  He was exposed to contrast 03/23/2024, 03/26/2024.   Update: Renal function worse today, Cr up to 2.99, start IVFs at 40cc/hr.    2.  Coronary artery disease/chronic systolic heart failure.  Patient underwent cardiac catheterization on 03/23/2024.  He had 70% stenosis in the left main coronary artery.  Palliative care consulted, not a candidate for further intervention.   3.  Secondary hyperparathyroidism.  Monitor PTH, phos, and Ca periodically.     4.  Peripheral vascular disease.  Angiogram performed 03/26/2024.  Renal function continues to worsen.        LOS: 5 Jerry Mcpherson 12/18/20255:42 PM

## 2024-03-30 DIAGNOSIS — I509 Heart failure, unspecified: Secondary | ICD-10-CM

## 2024-03-30 DIAGNOSIS — I5022 Chronic systolic (congestive) heart failure: Secondary | ICD-10-CM

## 2024-03-30 LAB — BASIC METABOLIC PANEL WITH GFR
Anion gap: 15 (ref 5–15)
BUN: 47 mg/dL — ABNORMAL HIGH (ref 8–23)
CO2: 20 mmol/L — ABNORMAL LOW (ref 22–32)
Calcium: 8 mg/dL — ABNORMAL LOW (ref 8.9–10.3)
Chloride: 105 mmol/L (ref 98–111)
Creatinine, Ser: 2.84 mg/dL — ABNORMAL HIGH (ref 0.61–1.24)
GFR, Estimated: 21 mL/min — ABNORMAL LOW
Glucose, Bld: 87 mg/dL (ref 70–99)
Potassium: 4.6 mmol/L (ref 3.5–5.1)
Sodium: 140 mmol/L (ref 135–145)

## 2024-03-30 LAB — MAGNESIUM: Magnesium: 2 mg/dL (ref 1.7–2.4)

## 2024-03-30 MED ORDER — LACTULOSE 10 GM/15ML PO SOLN
30.0000 g | Freq: Two times a day (BID) | ORAL | Status: DC | PRN
  Administered 2024-03-30: 30 g via ORAL
  Filled 2024-03-30: qty 60

## 2024-03-30 MED ORDER — OXYCODONE-ACETAMINOPHEN 10-325 MG PO TABS: 1.0000 | ORAL_TABLET | Freq: Four times a day (QID) | ORAL | 0 refills | Status: DC | PRN

## 2024-03-30 NOTE — Telephone Encounter (Signed)
 If he is in the hospital won't they provide that until he gets out?   Copied from CRM [id number]. Topic: Clinical - Medication Question >> Mar 29, 2024  1:41 PM Jerry Mcpherson wrote: Reason for CRM: patient stated he is in [place] [facility]. Patient has been seeing a kidney doctor.  Patient is requesting a refill for his oxyCODONE -acetaminophen  (PERCOCET) 10-325 MG tablet [[id number]]  [phone] >> Mar 29, 2024  3:47 PM Jerry Mcpherson wrote: Jerry Mcpherson is calling back to check on the above medication. He thinks he will be out of the hospital tomorrow and wants to pick the medication. Please call him at [phone] (his hospital room number) with any questions.

## 2024-03-30 NOTE — Progress Notes (Signed)
 " Progress Note   Patient: Jerry Mcpherson FMW:[id number] DOB: [birth year] DOA: 03/23/2024     6 DOS: the patient was seen and examined on 03/30/2024      Brief Narrative:   83 y.o. male with past medical history of recurrent auricles, chronic HFrEF with LVEF 30-35%, HTN, hypothyroidism, DVT, PAF on Eliquis , bladder cancer status post cystectomy and chronic urostomy, PVD, who has come in today for elective LHC.    12/12: Vascular surgery, cardiology consultation, right/left heart cath  12/13: Transfer to stepdown, intensivist consult due to persistent hypotension, nephrology c/s 12/14: transfer to PCU, Palliative care c/s 12/15: Left lower extremity angiogram 12/16: hypotensive     Assessment & Plan:   Atrial flutter/Fib with RVR Continue Eliquis  Currently rate controlled Continue amiodarone    Left 3rd and 4th toe chronic ulcer History of PVD - The ulcer appears to be chronic, likely secondary to PVD as pedal pulses on the left significantly diminished compared to the right side, has been followed by wound care center with weekly dressing change. wound appears to be stable and no signs of infection. - vascular surgery-s/p diagnostic angiogram of the left lower extremity - anterior tibial and posterior tibial arteries occlusion with lots of small vessel dz - no revascularization option    Chronic leukocytosis - As per oncology's note from earlier this month, chronic leukocytosis likely secondary to polycythemia vera, for which patient has been taking hydroxyurea . - Patient denied any cough, no urinary symptoms or diarrhea and left fourth toe ulcer appears to be chronic, less likely has a active infection at this point.  Plan to monitor off antibiotics.   Decompensated combined non-anion gap metabolic acidosis and respiratory alkalosis  AKI on CKD stage IV Bladder cancer status post resection with chronic urostomy - Euvolemic, creatinine level stable - Nephrology seen Continue  IV fluid as recommended by nephrology Lasix  on hold   Syncope - Likely secondary to worsening of chronic HFrEF as well as moderate to severe aortic stenosis - followed by cardiology, - conservative management. - He appears to be euvolemic at this point   CAD with left main stenosis seen on cardiac cath this admission Chronic systolic and diastolic heart failure with EF of 30 to 35% Moderate aortic stenosis Heparin  transition to Eliquis  and Plavix , statin   Polycythemia vera - Hemoglobin stable, continue hydroxyurea  - Patient follow-up with hematology.   Hypothyroidism - Continue Synthroid    Goals of care-palliative care seen,  His overall prognosis is poor     DVT prophylaxis: Eliquis      Code Status: Full code Family Communication: None at bedside Disposition Plan: Possible discharge in 2-3 days depending on clinical condition, cardiology, nephrology evaluation     Consultants:  Nephrology Vascular surgery Cardiology Intensivist Palliative care     Examination:   General exam: Appears calm and comfortable, elderly and chronically ill/cachectic looking Respiratory system: Clear to auscultation. Respiratory effort normal. Cardiovascular system: S1,S2 normal. No M/R/G Gastrointestinal system: Abdomen is soft, benign Central nervous system: Alert and oriented. No focal neurological deficits. Extremities: Symmetric 5 x 5 power. Skin: Left foot in the dressing for small superficial ulceration between 3rd and 4th toe Psychiatry: Judgement and insight appear normal. Mood & affect appropriate.    Subjective: Denies any acute overnight event Nephrology is recommending 1 more day to observe renal function before discharge   Data Reviewed:      Latest Ref Rng & Units 03/29/2024    4:02 AM 03/28/2024    4:54 AM  03/27/2024    4:41 AM  CBC  WBC 4.0 - 10.5 K/uL 18.2  22.0  22.4   Hemoglobin 13.0 - 17.0 g/dL 88.7  87.7  87.0   Hematocrit 39.0 - 52.0 % 37.6  39.4  42.6    Platelets 150 - 400 K/uL 458  505  557        Latest Ref Rng & Units 03/30/2024    3:52 AM 03/29/2024    4:02 AM 03/28/2024    4:54 AM  BMP  Glucose 70 - 99 mg/dL 87  94  95   BUN 8 - 23 mg/dL 47  44  37   Creatinine 0.61 - 1.24 mg/dL 7.15  7.00  7.48   Sodium 135 - 145 mmol/L 140  136  135   Potassium 3.5 - 5.1 mmol/L 4.6  4.3  4.5   Chloride 98 - 111 mmol/L 105  103  105   CO2 22 - 32 mmol/L 20  20  17    Calcium  8.9 - 10.3 mg/dL 8.0  8.1  8.4       Vitals:   03/29/24 2355 03/30/24 0412 03/30/24 0925 03/30/24 1532  BP: 103/88 99/71 123/62 115/78  Pulse: 87 94 76 69  Resp: 18 17  20   Temp: 97.9 F (36.6 C) 98 F (36.7 C) 98.3 F (36.8 C) (!) 97.5 F (36.4 C)  TempSrc: Oral Oral  Oral  SpO2: 93% (!) 89% 96% 98%  Weight:      Height:         Author: Drue ONEIDA Potter, MD 03/30/2024 4:48 PM  For on call review [email or web address].  "

## 2024-03-30 NOTE — Plan of Care (Signed)
  Problem: Education: Goal: Understanding of CV disease, CV risk reduction, and recovery process will improve Outcome: Progressing Goal: Individualized Educational Video(s) Outcome: Progressing   Problem: Activity: Goal: Ability to return to baseline activity level will improve Outcome: Progressing   Problem: Cardiovascular: Goal: Ability to achieve and maintain adequate cardiovascular perfusion will improve Outcome: Progressing Goal: Vascular access site(s) Level 0-1 will be maintained Outcome: Progressing   Problem: Health Behavior/Discharge Planning: Goal: Ability to safely manage health-related needs after discharge will improve Outcome: Progressing   Problem: Education: Goal: Knowledge of General Education information will improve Description: Including pain rating scale, medication(s)/side effects and non-pharmacologic comfort measures Outcome: Progressing   Problem: Health Behavior/Discharge Planning: Goal: Ability to manage health-related needs will improve Outcome: Progressing   Problem: Clinical Measurements: Goal: Ability to maintain clinical measurements within normal limits will improve Outcome: Progressing

## 2024-03-30 NOTE — Plan of Care (Signed)

## 2024-03-30 NOTE — Progress Notes (Signed)
 " [facility]  ROUNDING NOTE   Subjective:   Renal function was slightly better today with a creatinine of 2.8. IV fluids were infusing. Patient was sitting up in chair.  Lab Results  Component Value Date   CREATININE 2.84 (H) 03/30/2024   CREATININE 2.99 (H) 03/29/2024   CREATININE 2.51 (H) 03/28/2024   12/18 0701 - 12/19 0700 In: 240 [P.O.:240] Out: 920 [Urine:920]   Objective:  Vital signs in last 24 hours:  Temp:  [97.5 F (36.4 C)-98.3 F (36.8 C)] 97.5 F (36.4 C) (12/19 1532) Pulse Rate:  [69-94] 69 (12/19 1532) Resp:  [17-20] 20 (12/19 1532) BP: (99-123)/(62-88) 115/78 (12/19 1532) SpO2:  [89 %-98 %] 98 % (12/19 1532)  Weight change:  Filed Weights   03/23/24 0909 03/24/24 1400 03/26/24 1422  Weight: 65.7 kg 64.1 kg 64.1 kg    Intake/Output: I/O last 3 completed shifts: In: 360 [P.O.:360] Out: 1670 [Urine:1670]   Intake/Output this shift:  Total I/O In: 660 [P.O.:660] Out: 900 [Urine:900]  Physical Exam: General: No acute distress  Head: Normocephalic, atraumatic. Moist oral mucosal membranes  Neck: Supple  Lungs:  Clear to auscultation, normal effort  Heart: S1S2 irregular  Abdomen:  Soft, nontender, bowel sounds present, urostomy present  Extremities: No peripheral edema.  Neurologic: Awake, alert, following commands  Skin: No acute rash  Access: No hemodialysis access    Basic Metabolic Panel: Recent Labs  Lab 03/26/24 0356 03/27/24 0441 03/28/24 0454 03/29/24 0402 03/30/24 0352  NA 138 136 135 136 140  K 4.3 4.5 4.5 4.3 4.6  CL 106 104 105 103 105  CO2 18* 19* 17* 20* 20*  GLUCOSE 100* 106* 95 94 87  BUN 38* 33* 37* 44* 47*  CREATININE 2.27* 2.34* 2.51* 2.99* 2.84*  CALCIUM  8.0* 8.2* 8.4* 8.1* 8.0*  MG 1.9 1.9 2.0 2.0 2.0    Liver Function Tests: No results for input(s): AST, ALT, ALKPHOS, BILITOT, PROT, ALBUMIN in the last 168 hours. No results for input(s): LIPASE, AMYLASE in the last 168  hours. No results for input(s): AMMONIA in the last 168 hours.  CBC: Recent Labs  Lab 03/25/24 0307 03/26/24 0356 03/27/24 0441 03/28/24 0454 03/29/24 0402  WBC 23.4* 22.3* 22.4* 22.0* 18.2*  NEUTROABS  --   --   --   --  14.9*  HGB 12.0* 12.4* 12.9* 12.2* 11.2*  HCT 40.0 40.5 42.6 39.4 37.6*  MCV 83.2 83.7 83.7 81.9 84.5  PLT 472* 538* 557* 505* 458*    Cardiac Enzymes: No results for input(s): CKTOTAL, CKMB, CKMBINDEX, TROPONINI in the last 168 hours.  BNP: Invalid input(s): POCBNP  CBG: Recent Labs  Lab 03/24/24 1346 03/26/24 1310  GLUCAP 110* 112*    Microbiology: Results for orders placed or performed during the hospital encounter of 03/23/24  MRSA Next Gen by PCR, Nasal     Status: None   Collection Time: 03/24/24  1:43 PM   Specimen: Nasal Mucosa; Nasal Swab  Result Value Ref Range Status   MRSA by PCR Next Gen NOT DETECTED NOT DETECTED Final    Comment: (NOTE) The GeneXpert MRSA Assay (FDA approved for NASAL specimens only), is one component of a comprehensive MRSA colonization surveillance program. It is not intended to diagnose MRSA infection nor to guide or monitor treatment for MRSA infections. Test performance is not FDA approved in patients less than 52 years old. Performed at [facility], [address], [place], [place] [address]     Coagulation Studies:  No results for input(s): LABPROT, INR in the last 72 hours.   Urinalysis: No results for input(s): COLORURINE, LABSPEC, PHURINE, GLUCOSEU, HGBUR, BILIRUBINUR, KETONESUR, PROTEINUR, UROBILINOGEN, NITRITE, LEUKOCYTESUR in the last 72 hours.  Invalid input(s): APPERANCEUR     Imaging: No results found.    Medications:    sodium chloride  40 mL/hr at 03/30/24 1127     [START ON 04/02/2024] amiodarone   200 mg Oral BID   Followed by   NOREEN ON 04/09/2024] amiodarone   200 mg Oral Daily   amiodarone   400 mg Oral BID   apixaban   2.5  mg Oral BID   atorvastatin   80 mg Oral Daily   ciprofloxacin   2 drop Left Eye Q4H while awake   clopidogrel   75 mg Oral Daily   ferrous sulfate   325 mg Oral Daily   fluticasone  furoate-vilanterol  1 puff Inhalation Daily   hydroxyurea   500 mg Oral Daily   levothyroxine   50 mcg Oral Q0600   lidocaine   2 patch Transdermal Q24H   naphazoline-pheniramine  1 drop Left Eye QID   polyethylene glycol  17 g Oral Daily   sodium bicarbonate   1,300 mg Oral BID   sodium chloride  flush  3 mL Intravenous Q12H   oxyCODONE  **AND** acetaminophen , acetaminophen , lactulose, ondansetron  (ZOFRAN ) IV, sodium chloride  flush  Assessment/ Plan:  83 y.o. male with a PMHx of chronic systolic heart failure ejection fraction 30 to 35%, hypertension, hypothyroidism, DVT, paroxysmal atrial fibrillation on Eliquis , bladder cancer status post cystectomy with chronic urostomy, peripheral vascular disease, who was admitted to [facility] on 03/23/2024 for elective left heart catheterization.    1.  Chronic kidney disease stage IV.  Patient close to his baseline creatinine at time of initial evaluation.  He was exposed to contrast 03/23/2024, 03/26/2024.   Lab Results  Component Value Date   CREATININE 2.84 (H) 03/30/2024   CREATININE 2.99 (H) 03/29/2024   CREATININE 2.51 (H) 03/28/2024  Update: Creatinine improved down to 2.8.  Continue IV fluid hydration for now.   2.  Coronary artery disease/chronic systolic heart failure.  Patient underwent cardiac catheterization on 03/23/2024.  He had 70% stenosis in the left main coronary artery.  Palliative care consulted, not a candidate for further intervention.   3.  Secondary hyperparathyroidism.  Monitor PTH, phos, and Ca periodically.     4.  Peripheral vascular disease.  Angiogram performed 03/26/2024.  Renal function has started to improve and creatinine down to 2.8.  Continue hydration as above.     LOS: 6 Janann Boeve 12/19/20255:43 PM   "

## 2024-03-31 ENCOUNTER — Other Ambulatory Visit: Payer: Self-pay

## 2024-03-31 DIAGNOSIS — I5022 Chronic systolic (congestive) heart failure: Secondary | ICD-10-CM | POA: Diagnosis not present

## 2024-03-31 LAB — BASIC METABOLIC PANEL WITH GFR
Anion gap: 9 (ref 5–15)
BUN: 45 mg/dL — ABNORMAL HIGH (ref 8–23)
CO2: 21 mmol/L — ABNORMAL LOW (ref 22–32)
Calcium: 8.2 mg/dL — ABNORMAL LOW (ref 8.9–10.3)
Chloride: 109 mmol/L (ref 98–111)
Creatinine, Ser: 2.48 mg/dL — ABNORMAL HIGH (ref 0.61–1.24)
GFR, Estimated: 25 mL/min — ABNORMAL LOW
Glucose, Bld: 129 mg/dL — ABNORMAL HIGH (ref 70–99)
Potassium: 4.2 mmol/L (ref 3.5–5.1)
Sodium: 139 mmol/L (ref 135–145)

## 2024-03-31 LAB — CBC WITH DIFFERENTIAL/PLATELET
Abs Immature Granulocytes: 0.12 K/uL — ABNORMAL HIGH (ref 0.00–0.07)
Basophils Absolute: 0.2 K/uL — ABNORMAL HIGH (ref 0.0–0.1)
Basophils Relative: 1 %
Eosinophils Absolute: 0.8 K/uL — ABNORMAL HIGH (ref 0.0–0.5)
Eosinophils Relative: 5 %
HCT: 38.4 % — ABNORMAL LOW (ref 39.0–52.0)
Hemoglobin: 11.6 g/dL — ABNORMAL LOW (ref 13.0–17.0)
Immature Granulocytes: 1 %
Lymphocytes Relative: 5 %
Lymphs Abs: 0.8 K/uL (ref 0.7–4.0)
MCH: 25.4 pg — ABNORMAL LOW (ref 26.0–34.0)
MCHC: 30.2 g/dL (ref 30.0–36.0)
MCV: 84.2 fL (ref 80.0–100.0)
Monocytes Absolute: 0.6 K/uL (ref 0.1–1.0)
Monocytes Relative: 4 %
Neutro Abs: 12.5 K/uL — ABNORMAL HIGH (ref 1.7–7.7)
Neutrophils Relative %: 84 %
Platelets: 542 K/uL — ABNORMAL HIGH (ref 150–400)
RBC: 4.56 MIL/uL (ref 4.22–5.81)
RDW: 24.8 % — ABNORMAL HIGH (ref 11.5–15.5)
Smear Review: NORMAL
WBC: 15 K/uL — ABNORMAL HIGH (ref 4.0–10.5)
nRBC: 0 % (ref 0.0–0.2)

## 2024-03-31 LAB — MAGNESIUM: Magnesium: 2.2 mg/dL (ref 1.7–2.4)

## 2024-03-31 MED ORDER — POLYETHYLENE GLYCOL 3350 17 GM/SCOOP PO POWD
17.0000 g | Freq: Every day | ORAL | 0 refills | Status: AC
  Filled 2024-03-31: qty 238, 14d supply, fill #0

## 2024-03-31 MED ORDER — AMIODARONE HCL 200 MG PO TABS
ORAL_TABLET | ORAL | 0 refills | Status: AC
  Filled 2024-03-31: qty 56, 40d supply, fill #0

## 2024-03-31 MED ORDER — ATORVASTATIN CALCIUM 80 MG PO TABS
80.0000 mg | ORAL_TABLET | Freq: Every day | ORAL | 3 refills | Status: AC
  Filled 2024-03-31: qty 30, 30d supply, fill #0

## 2024-03-31 MED ORDER — SODIUM BICARBONATE 650 MG PO TABS
1300.0000 mg | ORAL_TABLET | Freq: Two times a day (BID) | ORAL | 1 refills | Status: AC
  Filled 2024-03-31: qty 30, 8d supply, fill #0
  Filled 2024-04-21: qty 30, 8d supply, fill #1

## 2024-03-31 NOTE — TOC Transition Note (Signed)
 Transition of Care [facility]) - Discharge Note   Patient Details  Name: Jerry Mcpherson MRN: [id number] Date of Birth: [birth year]  Transition [facility]) CM/SW Contact:  Jerry Jackquline RAMAN, RN Phone Number: 03/31/2024, 1:46 PM   Clinical Narrative:   11:25 am: RNCM received a messaged via secure chat from the bedside nurse asking for a cab voucher for the patient to get home. I asked the bedside nurse to see if the patient could pay on his own and she said he say he is not able to pay. RNCM called and text his nephew Jerry Mcpherson @ [phone], left a message. Awaiting a call back. RNCM called patient's friend Jerry Mcpherson @ [phone] and left a message. Awaiting a call back.   12:00 pm: Received a call back from Jerry Mcpherson, informed her that the patient was discharging home and needed  ride home. She said that she was told by the nurse last night that he would be discharged via Taxi home and that she was at work and couldn't pick him up. I explained to her that we have to exhaust all of the patient's contacts before issuing a taxi voucher. I also informed her that if he qualified for ambulance transportation I would set it up but that someone would have to be at the house to accept him or they wouldn't leave him. She informed me that the patient's in laws lived next door and they might be able to be there when he got there but she didn't have the phone number.  RNCM asked the bedside nurse to see if she could get the phone number for me and she said that the patient said there is nobody who can meet him at the house.  1:45 pm: RNCM went to patient's room to have him sign the rider waiver and give him the taxi voucher and he told me that his stepson was coming to pick him up and he was waiting for the nursing staff to come wheel him out. Pt has discharge orders, no further concerns. RNCM signing off.   Final next level of care: Home/Self Care Barriers to Discharge: Barriers Resolved   Patient Goals and CMS  Choice            Discharge Placement                Patient to be transferred to facility by: Jerry Mcpherson and Jerry Mcpherson Name of family member notified: Step Son Patient and family notified of of transfer: 03/31/24  Discharge Plan and Services Additional resources added to the After Visit Summary for       Post Acute Care Choice: Resumption of Svcs/PTA Provider                               Social Drivers of Health (SDOH) Interventions SDOH Screenings   Food Insecurity: Food Insecurity Present (03/23/2024)  Housing: Low Risk (03/23/2024)  Transportation Needs: Unmet Transportation Needs (03/23/2024)  Utilities: Not At Risk (03/23/2024)  Alcohol Screen: Low Risk (08/10/2023)  Depression (PHQ2-9): Low Risk (01/17/2024)  Financial Resource Strain: Low Risk  (09/16/2023)   Received from [facility]  Recent Concern: Financial Resource Strain - Medium Risk (08/10/2023)  Physical Activity: Inactive (08/10/2023)  Social Connections: Socially Isolated (03/23/2024)  Stress: No Stress Concern Present (08/10/2023)  Tobacco Use: Medium Risk (03/23/2024)  Health Literacy: Adequate Health Literacy (08/10/2023)     Readmission Risk Interventions    03/29/2024  12:33 PM  Readmission Risk Prevention Plan  Transportation Screening Complete  PCP or Specialist Appt within 3-5 Days Complete  HRI or Home Care Consult Complete  Social Work Consult for Recovery Care Planning/Counseling Complete  Palliative Care Screening Not Applicable  Medication Review Oceanographer) Complete

## 2024-03-31 NOTE — Discharge Summary (Signed)
 " Physician Discharge Summary   Patient: Jerry Mcpherson MRN: [id number] DOB: 1940/11/20  Admit date:     03/23/2024  Discharge date: 03/31/2024  Discharge Physician: Drue ONEIDA Potter   PCP: [name], MD   Recommendations at discharge:  Follow-up with cardiology and nephrology  Discharge Diagnoses: Principal Problem:   Chronic HFrEF (heart failure with reduced ejection fraction) (HCC) Active Problems:   Arthralgia of left lower leg   Atrial flutter (HCC)   HFrEF (heart failure with reduced ejection fraction) (HCC)   Shock circulatory (HCC)   Atrial fibrillation with RVR (HCC)   AKI (acute kidney injury)   Pressure injury of skin   Ischemic cardiomyopathy   Ischemic leg   Coronary artery disease of native artery of native heart with stable angina pectoris   Hypertensive heart and chronic kidney disease with heart failure and with stage 5 chronic kidney disease, or end stage renal disease (HCC)  Resolved Problems:   * No resolved hospital problems. *  Hospital Course:   83 y.o. male with past medical history of recurrent auricles, chronic HFrEF with LVEF 30-35%, HTN, hypothyroidism, DVT, PAF on Eliquis , bladder cancer status post cystectomy and chronic urostomy, PVD, who has come in today for elective LHC.    12/12: Vascular surgery, cardiology consultation, right/left heart cath  12/13: Transfer to stepdown, intensivist consult due to persistent hypotension, nephrology c/s 12/14: transfer to PCU, Palliative care c/s 12/15: Left lower extremity angiogram 12/16: hypotensive  Other hospital course as noted below:     Assessment & Plan:   Atrial flutter/Fib with RVR Continue Eliquis  Currently rate controlled Continue amiodarone  Follow-up with cardiology   Left 3rd and 4th toe chronic ulcer History of PVD - The ulcer appears to be chronic, likely secondary to PVD as pedal pulses on the left significantly diminished compared to the right side, has been followed  by wound care center with weekly dressing change. wound appears to be stable and no signs of infection. - vascular surgery-s/p diagnostic angiogram of the left lower extremity - anterior tibial and posterior tibial arteries occlusion with lots of small vessel dz - no revascularization option    Chronic leukocytosis - As per oncology's note from earlier this month, chronic leukocytosis likely secondary to polycythemia vera, for which patient has been taking hydroxyurea . - Patient denied any cough, no urinary symptoms or diarrhea and left fourth toe ulcer appears to be chronic, less likely has a active infection at this point.  Plan to monitor off antibiotics.   Decompensated combined non-anion gap metabolic acidosis and respiratory alkalosis  AKI on CKD stage IV Bladder cancer status post resection with chronic urostomy - Euvolemic, creatinine level stable - Nephrology seen Follow-up with cardiologist   Syncope - Likely secondary to worsening of chronic HFrEF as well as moderate to severe aortic stenosis - followed by cardiology, - conservative management. - He appears to be euvolemic at this point   CAD with left main stenosis seen on cardiac cath this admission Chronic systolic and diastolic heart failure with EF of 30 to 35% Moderate aortic stenosis Continue Eliquis  and Plavix , statin   Polycythemia vera - Hemoglobin stable, continue hydroxyurea  - Patient follow-up with hematology.   Hypothyroidism - Continue Synthroid    Consultants: Nephrology, cardiology Procedures performed: None Disposition: Home Diet recommendation:  Cardiac diet DISCHARGE MEDICATION: Allergies as of 03/31/2024       Reactions   Losartan Potassium Swelling   angioedema Other reaction(s): Angioedema   Shellfish Allergy Swelling, Rash  Rash, itching, swelling Currently eats SHELLFISH without symptoms   Shellfish Protein-containing Drug Products Dermatitis, Swelling   Rash, itching, swelling     Rash, itching, swelling   Currently eats SHELLFISH without symptoms   Doxycycline  Other (See Comments)   Blisters on hand   Ultram  [tramadol Hcl] Nausea And Vomiting   Celecoxib Itching, Swelling, Rash, Other (See Comments)   Cannot take when eating shellfish   Codeine Palpitations   Heart racing   Cyclobenzaprine Other (See Comments)   Musculoskeletal therapy agents cause Excessive sedation Musculoskeletal therapy agents cause excessive sedation   Tramadol Hcl Nausea And Vomiting        Medication List     STOP taking these medications    amLODipine  2.5 MG tablet Commonly known as: NORVASC    furosemide  20 MG tablet Commonly known as: LASIX    metoprolol  succinate 50 MG 24 hr tablet Commonly known as: TOPROL -XL       TAKE these medications    amiodarone  200 MG tablet Commonly known as: PACERONE  Take 2 tablets by mouth 2 times daily for 3 days, THEN 1 tablet 2 times daily for 7 days, THEN 1 tablet daily. Start taking on: April 02, 2024 What changed:  See the new instructions. These instructions start on April 02, 2024. If you are unsure what to do until then, ask your doctor or other care provider.   aspirin  EC 81 MG tablet Take 81 mg by mouth daily. Swallow whole.   atorvastatin  80 MG tablet Commonly known as: LIPITOR  Take 1 tablet (80 mg total) by mouth daily. Start taking on: April 01, 2024   budesonide -formoterol  160-4.5 MCG/ACT inhaler Commonly known as: SYMBICORT  Inhale 2 puffs into the lungs 2 (two) times daily.   clopidogrel  75 MG tablet Commonly known as: PLAVIX  Take 75 mg by mouth daily.   Eliquis  2.5 MG Tabs tablet Generic drug: apixaban  Take 1 tablet (2.5 mg total) by mouth 2 (two) times daily. What changed:  how much to take Another medication with the same name was removed. Continue taking this medication, and follow the directions you see here.   ferrous sulfate  325 (65 FE) MG tablet Take 325 mg by mouth daily.   hydroxyurea   500 MG capsule Commonly known as: HYDREA  Take 500 mg by mouth daily.   levothyroxine  50 MCG tablet Commonly known as: SYNTHROID  Take 1 tablet (50 mcg total) by mouth daily. PLEASE NOTE CHANGE TO AND CANCEL ANY REMAINING REFILLS OF THE 25s What changed:  how much to take additional instructions   MULTIVITAMIN ADULT PO Take 1 tablet by mouth daily.   oxyCODONE -acetaminophen  10-325 MG tablet Commonly known as: PERCOCET Take 1 tablet by mouth every 6 (six) hours as needed. for pain   polyethylene glycol powder 17 GM/SCOOP powder Commonly known as: GLYCOLAX /MIRALAX  Take 17 g by mouth daily. Dissolve 1 capful (17g) in 4-8 ounces of liquid and take by mouth daily.   sodium bicarbonate  650 MG tablet Take 2 tablets (1,300 mg total) by mouth 2 (two) times daily. What changed:  how much to take when to take this   valACYclovir  1000 MG tablet Commonly known as: VALTREX  TAKE 1 TABLET BY MOUTH TWICE A DAY AS NEEDED               Durable Medical Equipment  (From admission, onward)           Start     Ordered   03/31/24 1037  For home use only DME Walker rolling  Once       Question Answer Comment  Walker: With 5 Inch Wheels   Patient needs a walker to treat with the following condition Ambulatory dysfunction      03/31/24 1036            Follow-up Information     Lateef, Munsoor, MD. Call.   Specialty: Nephrology Contact information: [address] [place] [address] [phone]         Perla Evalene PARAS, MD Follow up.   Specialty: Cardiology Contact information: [address] [place] [address] [phone]                Discharge Exam: Fredricka Weights   03/23/24 9090 03/24/24 1400 03/26/24 1422  Weight: 65.7 kg 64.1 kg 64.1 kg   General exam: Appears calm and comfortable, elderly and chronically ill/cachectic looking Respiratory system: Clear to auscultation. Respiratory effort normal. Cardiovascular system:  S1,S2 normal. No M/R/G Gastrointestinal system: Abdomen is soft, benign Central nervous system: Alert and oriented. No focal neurological deficits. Extremities: Symmetric 5 x 5 power. Skin: Left foot in the dressing for small superficial ulceration between 3rd and 4th toe Psychiatry: Judgement and insight appear normal. Mood & affect appropriate.   Condition at discharge: good  The results of significant diagnostics from this hospitalization (including imaging, microbiology, ancillary and laboratory) are listed below for reference.   Imaging Studies: PERIPHERAL VASCULAR CATHETERIZATION Result Date: 03/26/2024 See surgical note for result.  DG Chest 1 View Result Date: 03/24/2024 CLINICAL DATA:  Congestive heart failure. EXAM: CHEST  1 VIEW COMPARISON:  03/23/2024 FINDINGS: Stable enlarged cardiac silhouette and tortuous and calcified thoracic aorta. Moderate-sized hiatal hernia. Clear lungs with normal vascularity. Mild chronic interstitial prominence. Moderate thoracic spine degenerative changes and mild scoliosis. IMPRESSION: 1. No acute abnormality. 2. Stable cardiomegaly. 3. Moderate-sized hiatal hernia. Electronically Signed   By: Elspeth Bathe M.D.   On: 03/24/2024 11:28   US  RENAL Result Date: 03/23/2024 EXAM: US  Retroperitoneum Complete, Renal. 03/23/2024 05:51:35 PM TECHNIQUE: Real-time ultrasonography of the retroperitoneum renal was performed. COMPARISON: None available CLINICAL HISTORY: AKI (acute kidney injury). FINDINGS: FINDINGS: RIGHT KIDNEY/URETER: Right kidney measures 10.4 x 5.3 x 4.8 cm (138 mL). Normal cortical echogenicity. Mild fullness of the right renal collecting system without frank hydronephrosis. No calculus. No mass. LEFT KIDNEY/URETER: Left kidney measures 7.0 x 4.2 x 3.5 cm (55 mL). Normal cortical echogenicity. Mild fullness of the left renal collecting system without frank hydronephrosis. No calculus. No mass. BLADDER: Status post cystectomy. IMPRESSION: 1. Mild  fullness of the bilateral renal collecting systems without frank hydronephrosis. 2. Status post cystectomy. Electronically signed by: Pinkie Pebbles MD 03/23/2024 09:32 PM EST RP Workstation: [id number]   DG Chest 1 View Result Date: 03/23/2024 EXAM: 1 VIEW(S) XRAY OF THE CHEST 03/23/2024 03:34:00 PM COMPARISON: 12/09/2023 CLINICAL HISTORY: CHF (congestive heart failure) (HCC) FINDINGS: LUNGS AND PLEURA: No focal pulmonary opacity. No pleural effusion. No pneumothorax. HEART AND MEDIASTINUM: Cardiomegaly. Tortuous thoracic aorta. Atherosclerotic calcifications. BONES AND SOFT TISSUES: Scoliosis of thoracolumbar spine. IMPRESSION: 1. Cardiomegaly with tortuous thoracic aorta and atherosclerotic calcifications. 2. Scoliosis of the thoracolumbar spine. Electronically signed by: Franky Stanford MD 03/23/2024 09:16 PM EST RP Workstation: [id number]   CARDIAC CATHETERIZATION Result Date: 03/23/2024   Ost LM to Mid LM lesion is 70% stenosed.   Mid RCA to Dist RCA lesion is 35% stenosed.   LV end diastolic pressure is normal.   There is moderate aortic valve stenosis. Conclusions: Severe left main  coronary artery disease with 70% proximal stenosis.  Otherwise, mild-moderate coronary artery disease. Normal left and right heart filling pressures. Moderately reduced Fick cardiac output/index (CO 3.1 L/min, CI 1.8 L/min/m). Moderate aortic valve stenosis (peak-to-peak gradient 22 mmHg).  Recommendations: Admit for optimization of heart failure and further evaluation of suspected critical limb ischemia involving the left foot and worsening leukocytosis. May need to consider transfer to [facility] for consideration of LMCA stenting, though given the patient's comorbidities, palliative medical therapy will also need to be considered.  I do not think he is a candidate for CABG. Gentle postcatheterization hydration in the setting of low normal filling pressures and CKD stage IV. Lonni Hanson, MD [facility]   Microbiology: Results for orders placed or performed during the hospital encounter of 03/23/24  MRSA Next Gen by PCR, Nasal     Status: None   Collection Time: 03/24/24  1:43 PM   Specimen: Nasal Mucosa; Nasal Swab  Result Value Ref Range Status   MRSA by PCR Next Gen NOT DETECTED NOT DETECTED Final    Comment: (NOTE) The GeneXpert MRSA Assay (FDA approved for NASAL specimens only), is one component of a comprehensive MRSA colonization surveillance program. It is not intended to diagnose MRSA infection nor to guide or monitor treatment for MRSA infections. Test performance is not FDA approved in patients less than 9 years old. Performed at [facility], [address] [place], [place] [address]     Labs: CBC: Recent Labs  Lab 03/26/24 [phone] 03/27/24 0441 03/28/24 0454 03/29/24 0402 03/31/24 0430  WBC 22.3* 22.4* 22.0* 18.2* 15.0*  NEUTROABS  --   --   --  14.9* 12.5*  HGB 12.4* 12.9* 12.2* 11.2* 11.6*  HCT 40.5 42.6 39.4 37.6* 38.4*  MCV 83.7 83.7 81.9 84.5 84.2  PLT 538* 557* 505* 458* 542*   Basic Metabolic Panel: Recent Labs  Lab 03/27/24 0441 03/28/24 0454 03/29/24 0402 03/30/24 0352 03/31/24 0430  NA 136 135 136 140 139  K 4.5 4.5 4.3 4.6 4.2  CL 104 105 103 105 109  CO2 19* 17* 20* 20* 21*  GLUCOSE 106* 95 94 87 129*  BUN 33* 37* 44* 47* 45*  CREATININE 2.34* 2.51* 2.99* 2.84* 2.48*  CALCIUM  8.2* 8.4* 8.1* 8.0* 8.2*  MG 1.9 2.0 2.0 2.0 2.2   Liver Function Tests: No results for input(s): AST, ALT, ALKPHOS, BILITOT, PROT, ALBUMIN in the last 168 hours. CBG: Recent Labs  Lab 03/26/24 1310  GLUCAP 112*    Discharge time spent:  35 minutes.  Signed: Drue ONEIDA Potter, MD [facility] 03/31/2024 "

## 2024-03-31 NOTE — Progress Notes (Signed)
 " [facility]  PROGRESS NOTE   Subjective:   Feels much better and is anxious to go home  Objective:  Vital signs: Blood pressure 125/61, pulse 67, temperature 97.6 F (36.4 C), resp. rate 17, height 5' 7 (1.702 m), weight 64.1 kg, SpO2 98%.  Intake/Output Summary (Last 24 hours) at 03/31/2024 1036 Last data filed at 03/31/2024 0600 Gross per 24 hour  Intake 2122.13 ml  Output 675 ml  Net 1447.13 ml   Filed Weights   03/23/24 0909 03/24/24 1400 03/26/24 1422  Weight: 65.7 kg 64.1 kg 64.1 kg     Physical Exam: General:  No acute distress  Head:  Normocephalic, atraumatic. Moist oral mucosal membranes  Eyes:  Anicteric  Neck:  Supple  Lungs:   Clear to auscultation, normal effort  Heart:  S1S2 no rubs  Abdomen:   Soft, nontender, bowel sounds present  Extremities:  peripheral edema.  Neurologic:  Awake, alert, following commands  Skin:  No lesions  Access:     Basic Metabolic Panel: Recent Labs  Lab 03/27/24 0441 03/28/24 0454 03/29/24 0402 03/30/24 0352 03/31/24 0430  NA 136 135 136 140 139  K 4.5 4.5 4.3 4.6 4.2  CL 104 105 103 105 109  CO2 19* 17* 20* 20* 21*  GLUCOSE 106* 95 94 87 129*  BUN 33* 37* 44* 47* 45*  CREATININE 2.34* 2.51* 2.99* 2.84* 2.48*  CALCIUM  8.2* 8.4* 8.1* 8.0* 8.2*  MG 1.9 2.0 2.0 2.0 2.2   GFR: Estimated Creatinine Clearance: 19.7 mL/min (A) (by C-G formula based on SCr of 2.48 mg/dL (H)).  Liver Function Tests: No results for input(s): AST, ALT, ALKPHOS, BILITOT, PROT, ALBUMIN in the last 168 hours. No results for input(s): LIPASE, AMYLASE in the last 168 hours. No results for input(s): AMMONIA in the last 168 hours.  CBC: Recent Labs  Lab 03/26/24 0356 03/27/24 0441 03/28/24 0454 03/29/24 0402 03/31/24 0430  WBC 22.3* 22.4* 22.0* 18.2* 15.0*  NEUTROABS  --   --   --  14.9* 12.5*  HGB 12.4* 12.9* 12.2* 11.2* 11.6*  HCT 40.5 42.6 39.4 37.6* 38.4*  MCV 83.7 83.7 81.9 84.5 84.2  PLT 538*  557* 505* 458* 542*     HbA1C: Hemoglobin A1C  Date/Time Value Ref Range Status  08/18/2020 10:07 AM 5.3 4.0 - 5.6 % Final  02/18/2020 11:00 AM 5.8 (A) 4.0 - 5.6 % Final    Urinalysis: No results for input(s): COLORURINE, LABSPEC, PHURINE, GLUCOSEU, HGBUR, BILIRUBINUR, KETONESUR, PROTEINUR, UROBILINOGEN, NITRITE, LEUKOCYTESUR in the last 72 hours.  Invalid input(s): APPERANCEUR    Imaging: No results found.   Medications:     [START ON 04/02/2024] amiodarone   200 mg Oral BID   Followed by   NOREEN ON 04/09/2024] amiodarone   200 mg Oral Daily   amiodarone   400 mg Oral BID   apixaban   2.5 mg Oral BID   atorvastatin   80 mg Oral Daily   ciprofloxacin   2 drop Left Eye Q4H while awake   clopidogrel   75 mg Oral Daily   ferrous sulfate   325 mg Oral Daily   fluticasone  furoate-vilanterol  1 puff Inhalation Daily   hydroxyurea   500 mg Oral Daily   levothyroxine   50 mcg Oral Q0600   lidocaine   2 patch Transdermal Q24H   naphazoline-pheniramine  1 drop Left Eye QID   polyethylene glycol  17 g Oral Daily   sodium bicarbonate   1,300 mg Oral BID   sodium chloride  flush  3 mL Intravenous  Q12H    Assessment/ Plan:     83 y.o. male with a PMHx of chronic systolic heart failure ejection fraction 30 to 35%, hypertension, hypothyroidism, DVT, paroxysmal atrial fibrillation on Eliquis , bladder cancer status post cystectomy with chronic urostomy, peripheral vascular disease, who was admitted to [facility] on 03/23/2024 for elective left heart catheterization.    1.  Chronic kidney disease stage IV.  Patient close to his baseline creatinine at time of initial evaluation.  He was exposed to contrast 03/23/2024, 03/26/2024.   Recent Labs       Lab Results  Component Value Date    CREATININE 2.84 (H) 03/30/2024    CREATININE 2.99 (H) 03/29/2024    CREATININE 2.51 (H) 03/28/2024    Update: Creatinine improved down to 2.5.  Continue IV fluid hydration for now.   2.   Coronary artery disease/chronic systolic heart failure.  Patient underwent cardiac catheterization on 03/23/2024.  He had 70% stenosis in the left main coronary artery.  Palliative care consulted, not a candidate for further intervention.    3.  Secondary hyperparathyroidism.  Monitor PTH, phos, and Ca periodically.     4.  Peripheral vascular disease.  Angiogram performed 03/26/2024.  Renal function has started to improve and creatinine down to 2.48.   Labs and medications reviewed. Stable for discharge home and is advised to follow up with Dr. Marcelino. Patient said he will follow up.   LOS: 7 Pinkey Edman, MD [facility] 12/20/202510:36 AM  "

## 2024-04-02 ENCOUNTER — Telehealth: Payer: Self-pay

## 2024-04-02 MED ORDER — ONDANSETRON 4 MG PO TBDP: 4.0000 mg | ORAL_TABLET | Freq: Three times a day (TID) | ORAL | 0 refills | Status: DC | PRN

## 2024-04-02 NOTE — Telephone Encounter (Signed)
 Copied from CRM #[id number]. Topic: Clinical - Medication Question >> Apr 02, 2024 10:09 AM Leonette SQUIBB wrote: Reason for CRM: Pt is out of the hospital and needs something sent to the pharmacy for nausea.  [facility] [place]

## 2024-04-02 NOTE — Telephone Encounter (Signed)
 [facility] sent [facility]

## 2024-04-02 NOTE — Telephone Encounter (Signed)
 Need more info? Why is he nauseated? I don't think we've seen him for this.

## 2024-04-02 NOTE — Telephone Encounter (Unsigned)
 Copied from CRM #[id number]. Topic: Clinical - Medication Question >> Apr 02, 2024 10:09 AM Leonette SQUIBB wrote: Reason for CRM: Pt is out of the hospital and needs something sent to the pharmacy for nausea.  [facility] [place] >> Apr 02, 2024  2:07 PM Montie POUR wrote: Please call Calyn at [phone] to let him know if above medication can be [facility] to [facility] [place].

## 2024-04-02 NOTE — Transitions of Care (Post Inpatient/ED Visit) (Signed)
 "  04/02/2024  Name: Jerry Mcpherson MRN: [id number] DOB: 05/07/1940  Today's TOC FU Call Status: Today's TOC FU Call Status:: Successful TOC FU Call Completed TOC FU Call Complete Date: 04/02/24  Patient's Name and Date of Birth confirmed. Name, DOB  Transition Care Management Follow-up Telephone Call Date of Discharge: 03/31/24 Discharge Facility: [facility]) Type of Discharge: Inpatient Admission Primary Inpatient Discharge Diagnosis:: Chronic HFrEF (heart failure with reduced ejection fraction) How have you been since you were released from the hospital?: Better Any questions or concerns?: Yes Patient Questions/Concerns:: have just called MD office for medications Patient Questions/Concerns Addressed: Other:  Items Reviewed: Did you receive and understand the discharge instructions provided?: Yes Medications obtained,verified, and reconciled?: No Medications Not Reviewed Reasons:: Advised Patient to Call Provider Office (Patient states, i have called for nausa medications and he is supposed to call me back) Any new allergies since your discharge?: No Dietary orders reviewed?: Yes Type of Diet Ordered:: Heart Healthy  Medications Reviewed Today:  Patient states, I have told everyone I don't know the names of my medicines and as long as I have what the doctors says is needed, I'm fine with that.  Medications Reviewed Today   Medications were not reviewed in this encounter     Home Care and Equipment/Supplies: Were Home Health Services Ordered?: Yes Name of Home Health Agency:: [facility] (Offerred to contact but states I will call them, cause they told me to call them when I get out.) Has Agency set up a time to come to your home?: No EMR reviewed for Home Health Orders: Orders present/patient has not received call (refer to CM for follow-up) Any new equipment or medical supplies ordered?: No  Functional Questionnaire: Do you need  assistance with bathing/showering or dressing?: Yes Do you need assistance with meal preparation?: No Do you need assistance with eating?: No Do you have difficulty maintaining continence: No Do you need assistance with getting out of bed/getting out of a chair/moving?: No Do you have difficulty managing or taking your medications?: No (Don't ask me the names of these medicines, I just make sure I have what they said I need and take it.)  Follow up appointments reviewed: PCP Follow-up appointment confirmed?: No (Patient allowed this writer to assist with Hospital Follow up states, I don't have the patience to call back out there today, I can't stand their phone system to get someone on the phone) Date of PCP follow-up appointment?: 04/09/24 (arrange through [employer]) Follow-up Provider: Dr. Gasper Specialist [facility] Follow-up appointment confirmed?: NA Do you need transportation to your follow-up appointment?: Yes (Patient states he will arrange transportation) Transportation Need Intervention Addressed By:: Other: (Patient will call information given for HFU appt) Do you understand care options if your condition(s) worsen?: Yes-patient verbalized understanding  SDOH Interventions Today    Flowsheet Row Most Recent Value  SDOH Interventions   Food Insecurity Interventions Inpatient TOC  [States resources given at the hospital. I don't have the patience for all of this rumble jumble]  Housing Interventions Intervention Not Indicated  [I am staying at my nephew's father house and I am doing just fine]  Transportation Interventions Intervention Not Indicated  [I just need enough time to arrange the transportation to my appointments, I call them but it's one woman that drives I don't like how she drives and I told her]  Utilities Interventions Intervention Not Indicated   Patient declines review of system states, it's too many questions and all  I am concerned with now is that I called  the office for Dr. Gasper to call me in some nausea medicine. I generally make all of my calls, but you can get me an appointment and I'll arrange the transportation.  Hospital follow up made for 04/09/24 at 10:00 M. Patient states he will arrange his transportation. Education for self-mgmt of  HF provided during this outreach regarding: -s/s of worsening condition and when to seek medical attention -importance of completing all post discharge hospital hospital follow up appts -adherence to med regimen [facility] TOC 30-day program enrollment reviewed and discussed with pt/caregiver. They have declined enrollment in 30 day TOC program due to: doesn't have the patience and will have home health to come out.  Richerd Fish, RN, BSN, CCM [facility], [facility] Management Coordinator Direct Dial: [phone]        "

## 2024-04-02 NOTE — Telephone Encounter (Signed)
 Please advise

## 2024-04-03 ENCOUNTER — Telehealth: Payer: Self-pay

## 2024-04-03 NOTE — Telephone Encounter (Signed)
 noted

## 2024-04-03 NOTE — Telephone Encounter (Signed)
 That's fine

## 2024-04-03 NOTE — Telephone Encounter (Signed)
 Spoke with Mikayla, she has been given verbal ok.

## 2024-04-03 NOTE — Telephone Encounter (Signed)
 Copied from CRM #[id number]. Topic: Clinical - Home Health Verbal Orders >> Apr 03, 2024 10:42 AM Sophia H wrote: Caller/Agency: Mikayla - RN [facility]  Callback Number: [phone] (vmail secured) Service Requested: Skilled Nursing Frequency: Resume skilled nursing, once a week till 05/18/2024. Any new concerns about the patient? No

## 2024-04-06 ENCOUNTER — Ambulatory Visit: Payer: Self-pay

## 2024-04-06 NOTE — Telephone Encounter (Signed)
 FYI Only or Action Required?: Action required by provider: update on patient condition.   Patient was last seen in primary care on 03/07/2024 by [name], MD.  Called Nurse Triage reporting Urinary Retention.  Symptoms began a week ago.  Interventions attempted: Nothing.  Symptoms are: unchanged.  Triage Disposition: See Physician Within 24 Hours  Patient/caregiver understands and will follow disposition?: No, wishes to speak with PCP  Copied from CRM #[id number]. Topic: Clinical - Red Word Triage >> Apr 06, 2024  2:35 PM Tinnie BROCKS wrote: Red Word that prompted transfer to Nurse Triage: Ana with [facility] calling to report that pt is urinating blood through his urostomy since hospital discharge last Saturday. >> Apr 06, 2024  2:43 PM Tinnie BROCKS wrote: Shasta would like a call back at [phone]. If she does not answer, please reach out to the patient. She also wants to add that on discharge they discontinued his lasix  and he is now having bilateral pitting edema +3 Reason for Disposition  [1] Bloody or red-colored urine AND [2] no recent prostate or bladder surgery  (Exception: Brief episode and urine now clear.)  Answer Assessment - Initial Assessment Questions 1. SYMPTOMS: What symptoms are you concerned about?     D/C from hospital 1 week ago, with urine getting more dark, blood colored. Says urostomy is about 85 years old. Bleeding has never happened before. I told patient he should be seen in 24 hrs and should go to ER since it's the weekend. Patient states he will think about it, but would rather see his doctor Monday. Says he can drive himself to hospital if needed. Change is color is only symptom. 2. ONSET:  When did the symptoms start?     1 week ago. 3. FEVER: Do you have a fever? If Yes, ask: What is the temperature, how was it measured, and when did it start?     Denies 4. ABDOMEN PAIN: Is there any abdomen pain? (Scale 1-10; or mild, moderate, severe)      Denies 5. URINE COLOR: What color is the urine?  Is there blood present in the urine? (e.g., clear, yellow, cloudy, tea-colored, blood streaks, bright red)     Blood Color 6. URINE AMOUNT: When did you last empty the urine from the collection bag? How much urine was in the bag at that time? How much urine is in the collection bag now?   Changes bag about every three hours, which is his normal. 7. INSERTION: How long have you (they) had the catheter?     2 years 8. OTHER SYMPTOMS: Are there any other symptoms? (e.g., abdomen swelling, back pain, bladder spasms, constipation, foul smelling urine, leaking of urine)      Denies  Answer Assessment - Initial Assessment Questions 1. ONSET: When did the swelling start? (e.g., minutes, hours, days)     Patient unsure. Says he hadn't noticed the swelling himself, he's been told they are swollen. Says Wound doctor told him that months ago. 2. LOCATION: What part of the leg is swollen?  Are both legs swollen or just one leg?     Both legs up to calves. Right is more swollen than left. 3. SEVERITY: How bad is the swelling? (e.g., localized; mild, moderate, severe)     Patient unable to say. 4. REDNESS: Is there redness or signs of infection?     Redness in feet sometimes.  5. PAIN: Is the swelling painful to touch? If Yes, ask: How painful  is it?   (Scale 1-10; mild, moderate or severe)     Left foot where the sores are. It depends 6. FEVER: Do you have a fever? If Yes, ask: What is it, how was it measured, and when did it start?      Denies 8. MEDICAL HISTORY: Do you have a history of blood clots (e.g., DVT), cancer, heart failure, kidney disease, or liver failure?     CAD, HTN, Heart failure. 10. OTHER SYMPTOMS: Do you have any other symptoms? (e.g., chest pain, difficulty breathing)       Denies  Protocols used: Urinary Catheter (e.g., Foley) Symptoms and Questions-A-AH, Leg Swelling and Edema-A-AH

## 2024-04-07 ENCOUNTER — Emergency Department

## 2024-04-07 ENCOUNTER — Other Ambulatory Visit: Payer: Self-pay

## 2024-04-07 ENCOUNTER — Emergency Department
Admission: EM | Admit: 2024-04-07 | Discharge: 2024-04-07 | Disposition: A | Discharge status: Home / Self Care | Source: Ambulatory Visit | Attending: Emergency Medicine | Admitting: Emergency Medicine

## 2024-04-07 DIAGNOSIS — R319 Hematuria, unspecified: Secondary | ICD-10-CM | POA: Diagnosis present

## 2024-04-07 DIAGNOSIS — Z8551 Personal history of malignant neoplasm of bladder: Secondary | ICD-10-CM | POA: Diagnosis not present

## 2024-04-07 DIAGNOSIS — I509 Heart failure, unspecified: Secondary | ICD-10-CM | POA: Insufficient documentation

## 2024-04-07 DIAGNOSIS — D72829 Elevated white blood cell count, unspecified: Secondary | ICD-10-CM | POA: Diagnosis not present

## 2024-04-07 LAB — CBC
HCT: 44.1 % (ref 39.0–52.0)
Hemoglobin: 13.1 g/dL (ref 13.0–17.0)
MCH: 26.5 pg (ref 26.0–34.0)
MCHC: 29.7 g/dL — ABNORMAL LOW (ref 30.0–36.0)
MCV: 89.1 fL (ref 80.0–100.0)
Platelets: 770 K/uL — ABNORMAL HIGH (ref 150–400)
RBC: 4.95 MIL/uL (ref 4.22–5.81)
RDW: 26.7 % — ABNORMAL HIGH (ref 11.5–15.5)
WBC: 19.7 K/uL — ABNORMAL HIGH (ref 4.0–10.5)
nRBC: 0 % (ref 0.0–0.2)

## 2024-04-07 LAB — URINALYSIS, ROUTINE W REFLEX MICROSCOPIC
RBC / HPF: >50 RBC/hpf (ref 0–5)
Squamous Epithelial / HPF: 0 /HPF (ref 0–5)

## 2024-04-07 LAB — BASIC METABOLIC PANEL WITH GFR
Anion gap: 11 (ref 5–15)
BUN: 35 mg/dL — ABNORMAL HIGH (ref 8–23)
CO2: 24 mmol/L (ref 22–32)
Calcium: 8 mg/dL — ABNORMAL LOW (ref 8.9–10.3)
Chloride: 105 mmol/L (ref 98–111)
Creatinine, Ser: 2.12 mg/dL — ABNORMAL HIGH (ref 0.61–1.24)
GFR, Estimated: 30 mL/min — ABNORMAL LOW
Glucose, Bld: 113 mg/dL — ABNORMAL HIGH (ref 70–99)
Potassium: 4.8 mmol/L (ref 3.5–5.1)
Sodium: 140 mmol/L (ref 135–145)

## 2024-04-07 MED ORDER — ACETAMINOPHEN 500 MG PO TABS
1000.0000 mg | ORAL_TABLET | Freq: Once | ORAL | Status: AC
  Administered 2024-04-07: 1000 mg via ORAL
  Filled 2024-04-07: qty 2

## 2024-04-07 MED ORDER — CEFUROXIME AXETIL 500 MG PO TABS: 500.0000 mg | ORAL_TABLET | Freq: Two times a day (BID) | ORAL | 0 refills | Status: AC

## 2024-04-07 NOTE — ED Provider Notes (Signed)
 "  [facility] Provider Note    Event Date/Time   First MD Initiated Contact with Patient 04/07/24 1857     (approximate)   History   Hematuria   HPI  Jerry Mcpherson is a 83 y.o. male with history of heart failure who comes in with hematuria.  Patient reports having some hematuria.  He did have catheterization done on his femoral artery and was discharged on 03/31/2024.  Does have a history of bladder cancer with urostomy bag.  Patient reports that soon after discharge she developed a little bit of blood in his urine.  He reports having some bright red blood pretty consistently since discharge.  He really denies any pain.  He reports changing his urostomy bag at his normal rate about once every 3 hours denies it being any increase  Physical Exam   Triage Vital Signs: ED Triage Vitals  Encounter Vitals Group     BP 04/07/24 1716 (!) 140/83     Girls Systolic BP Percentile --      Girls Diastolic BP Percentile --      Boys Systolic BP Percentile --      Boys Diastolic BP Percentile --      Pulse Rate 04/07/24 1716 76     Resp 04/07/24 1716 20     Temp 04/07/24 1716 97.8 F (36.6 C)     Temp Source 04/07/24 1716 Oral     SpO2 04/07/24 1716 100 %     Weight 04/07/24 1718 141 lb 1.5 oz (64 kg)     Height 04/07/24 1718 5' 7 (1.702 m)     Head Circumference --      Peak Flow --      Pain Score 04/07/24 1716 0     Pain Loc --      Pain Education --      Exclude from Growth Chart --     Most recent vital signs: Vitals:   04/07/24 1900 04/07/24 2100  BP: (!) 145/84 139/80  Pulse: 69 73  Resp: 17 16  Temp: 97.7 F (36.5 C) 97.9 F (36.6 C)  SpO2: 97% 96%     General: Awake, no distress.  CV:  Good peripheral perfusion.  Resp:  Normal effort.  Abd:  No distention.  Other:  Patient is hard of hearing.  His abdomen appears soft and nontender he does have some, does have thin nonviscous urine that is red in nature.   ED Results / Procedures  / Treatments   Labs (all labs ordered are listed, but only abnormal results are displayed) Labs Reviewed  URINALYSIS, ROUTINE W REFLEX MICROSCOPIC - Abnormal; Notable for the following components:      Result Value   Color, Urine RED (*)    APPearance TURBID (*)    Glucose, UA   (*)    Value: TEST NOT REPORTED DUE TO COLOR INTERFERENCE OF URINE PIGMENT   Hgb urine dipstick   (*)    Value: TEST NOT REPORTED DUE TO COLOR INTERFERENCE OF URINE PIGMENT   Bilirubin Urine   (*)    Value: TEST NOT REPORTED DUE TO COLOR INTERFERENCE OF URINE PIGMENT   Ketones, ur   (*)    Value: TEST NOT REPORTED DUE TO COLOR INTERFERENCE OF URINE PIGMENT   Protein, ur   (*)    Value: TEST NOT REPORTED DUE TO COLOR INTERFERENCE OF URINE PIGMENT   Nitrite   (*)    Value: TEST NOT REPORTED  DUE TO COLOR INTERFERENCE OF URINE PIGMENT   Leukocytes,Ua   (*)    Value: TEST NOT REPORTED DUE TO COLOR INTERFERENCE OF URINE PIGMENT   Bacteria, UA FEW (*)    All other components within normal limits  BASIC METABOLIC PANEL WITH GFR - Abnormal; Notable for the following components:   Glucose, Bld 113 (*)    BUN 35 (*)    Creatinine, Ser 2.12 (*)    Calcium  8.0 (*)    GFR, Estimated 30 (*)    All other components within normal limits  CBC - Abnormal; Notable for the following components:   WBC 19.7 (*)    MCHC 29.7 (*)    RDW 26.7 (*)    Platelets 770 (*)    All other components within normal limits  URINE CULTURE     RADIOLOGY I have reviewed the ct personally and interpreted possible kidney cyst    PROCEDURES:  Critical Care performed: No  Procedures   MEDICATIONS ORDERED IN ED: Medications - No data to display   IMPRESSION / MDM / ASSESSMENT AND PLAN / ED COURSE  I reviewed the triage vital signs and the nursing notes.   Patient's presentation is most consistent with acute presentation with potential threat to life or bodily function.   Patient comes in with concerns for hematuria.  He has  got no groin pain to suggest pseudoaneurysm of the groin.  Blood work was done to evaluate for anemia.  CBC shows stable hemoglobin at 13.1.  His urine does have some WBCs noted in it and we will send it for culture.  BMP shows creatinine that is actually improved from his baseline.  CT imaging is as below  IMPRESSION: 1. Scattered cortical nephrolithiasis and a solitary 1 mm nonobstructing calyceal stone in the inferior pole of the right kidney. 2. Mild right-sided pyelectasis without caliectasis, similar to prior renal ultrasound. 3. Ill-defined solid material with calcifications above the upper pole of the left kidney, significance indeterminate, not part of the left adrenal gland . MRI recommended with and without contrast if renal function permits, without contrast if not . 4. Elongate bilobed retroperitoneal cyst medial to the left kidney, appears uncomplicated but noncontrast CT limits evaluation. attention on MRI recommended. 5. Enlarged left periaortic chain nodes, the largest measuring 2.9 x 2 cm. Slightly prominent inguinal nodes. No other adenopathy. 6. Left hepatic cysts. 7. Mild splenomegaly appears similar to ultrasound in 2021. 8. Heavy vascular calcification including the aortic valve leaflets. Consider echocardiographic follow-up assessment of valvular function. Cardiomegaly. 9. Scattered subsegmental bronchial impactions in both lower lobes. Asymmetric right posterior basal opacity could be atelectasis or pneumonia. Minimal pleural effusions. 10. Constipation with diverticulosis.  Patient no symptoms suggest pneumonia  Discussed with urology Dr. Lita no evidence of retention recommended treating as possible UTI and he can follow-up in clinic.  No other emergent intervention seen today.  Did discuss incidental findings with patient and provided copy of report he will need outpatient MRIs to evaluate for these areas as it is unclear if 1 of these areas could be  leading to the hematuria and he will need to follow-up outpatient with urology.  Patient's white count is elevated but does have a history of chronic elevation when I reviewed the notes but at a precaution we are starting him on antibiotics.   Considered admission for patient but given his white count elevation is chronic his vitals are otherwise reassuring and he has had this ongoing for over a  week with stable hemoglobin I think that discharge home is reasonable      FINAL CLINICAL IMPRESSION(S) / ED DIAGNOSES   Final diagnoses:  Hematuria, unspecified type     Rx / DC Orders   ED Discharge Orders          Ordered    cefUROXime  (CEFTIN ) 500 MG tablet  2 times daily with meals        04/07/24 2247             Note:  This document was prepared using Dragon voice recognition software and may include unintentional dictation errors.   Ernest Ronal BRAVO, MD 04/07/24 2249  "

## 2024-04-07 NOTE — ED Notes (Signed)
 This tech answered pt call light and pt stated, I was wondering if I could get some more pain medication , because they gave me just one tylenol  but my foot is still hurting me. I was supposed to hear back from the doctor but he hasn't been in yet. I know it's a Christmas holiday, but I was hoping somebody could be here soon. Pt reassured that I will let his nurse know of his concern.

## 2024-04-07 NOTE — Discharge Instructions (Addendum)
 You need to follow-up with urology to discuss this further.  Please call them to make appointment This could be related to kidney stones or potentially a mass on your kidney.  You will need MRIs to make sure that there is not any signs of any cancer.  Your hemoglobin levels were reassuring and you should continue your blood thinners as I do not want to risk having you form any blood clots.  If you develop increasing weakness, inability to urinate that you should return to the ER immediately and have repeat evaluation.  We are starting you on some antibiotics out of precaution to ensure no evidence of UTI.  Your urine culture is pending. IMPRESSION: 1. Scattered cortical nephrolithiasis and a solitary 1 mm nonobstructing calyceal stone in the inferior pole of the right kidney. 2. Mild right-sided pyelectasis without caliectasis, similar to prior renal ultrasound. 3. Ill-defined solid material with calcifications above the upper pole of the left kidney, significance indeterminate, not part of the left adrenal gland . MRI recommended with and without contrast if renal function permits, without contrast if not . 4. Elongate bilobed retroperitoneal cyst medial to the left kidney, appears uncomplicated but noncontrast CT limits evaluation. attention on MRI recommended. 5. Enlarged left periaortic chain nodes, the largest measuring 2.9 x 2 cm. Slightly prominent inguinal nodes. No other adenopathy. 6. Left hepatic cysts. 7. Mild splenomegaly appears similar to ultrasound in 2021. 8. Heavy vascular calcification including the aortic valve leaflets. Consider echocardiographic follow-up assessment of valvular function. Cardiomegaly. 9. Scattered subsegmental bronchial impactions in both lower lobes. Asymmetric right posterior basal opacity could be atelectasis or pneumonia. Minimal pleural effusions. 10. Constipation with diverticulosis.

## 2024-04-07 NOTE — ED Triage Notes (Signed)
 Pt to ED POV for hematuria in urostomy bag since 2 months ago. Has had RLQ urostomy since 1 year. No pain or fevers. Pt is HOH.

## 2024-04-09 ENCOUNTER — Ambulatory Visit: Admitting: Family Medicine

## 2024-04-09 ENCOUNTER — Encounter: Payer: Self-pay | Admitting: Family Medicine

## 2024-04-09 VITALS — BP 113/63 | HR 75 | Resp 16 | Ht 70.0 in | Wt 148.0 lb

## 2024-04-09 DIAGNOSIS — Z8551 Personal history of malignant neoplasm of bladder: Secondary | ICD-10-CM | POA: Diagnosis not present

## 2024-04-09 DIAGNOSIS — Z9350 Unspecified cystostomy status: Secondary | ICD-10-CM

## 2024-04-09 DIAGNOSIS — N200 Calculus of kidney: Secondary | ICD-10-CM

## 2024-04-09 DIAGNOSIS — I2510 Atherosclerotic heart disease of native coronary artery without angina pectoris: Secondary | ICD-10-CM | POA: Diagnosis not present

## 2024-04-09 DIAGNOSIS — I4892 Unspecified atrial flutter: Secondary | ICD-10-CM

## 2024-04-09 DIAGNOSIS — I483 Typical atrial flutter: Secondary | ICD-10-CM

## 2024-04-09 DIAGNOSIS — N390 Urinary tract infection, site not specified: Secondary | ICD-10-CM

## 2024-04-09 DIAGNOSIS — R310 Gross hematuria: Secondary | ICD-10-CM | POA: Diagnosis not present

## 2024-04-09 DIAGNOSIS — N2889 Other specified disorders of kidney and ureter: Secondary | ICD-10-CM | POA: Diagnosis not present

## 2024-04-09 DIAGNOSIS — R319 Hematuria, unspecified: Secondary | ICD-10-CM

## 2024-04-09 DIAGNOSIS — N179 Acute kidney failure, unspecified: Secondary | ICD-10-CM

## 2024-04-09 NOTE — Progress Notes (Signed)
 "     Established patient visit   Patient: Jerry Mcpherson   DOB: October 04, 1940   83 y.o. Male  MRN: [id number] Visit Date: 04/09/2024  Today's healthcare provider: Nancyann Perry, MD   Chief Complaint  Patient presents with   Hospitalization Follow-up    Hosp f/u.SABRA Pt wants to discuss his new antibiotic,   Subjective    Discussed the use of AI scribe software for clinical note transcription with the patient, who gave verbal consent to proceed.  History of Present Illness   Jerry Mcpherson is an 83 year old male with coronary artery disease, heart failure and atrial flutter who presents for a hospital follow-up after a recent admission for a left heart catheterization with subsequent complications.  He was admitted on December 12th and discharged on December 20th following a left heart catheterization. He developed hypotension and atrial flutter, which was managed with amiodarone . The cardiac catheterization revealed severe left main coronary artery disease with 70% proximal stenosis and moderate aortic valve stenosis but is not considered to be a candidate for intervention.  He has a nonhealing wound on his left leg, which was evaluated for possible limb ischemia while hospitalized. An angiogram of the left lower extremity showed mild peripheral artery disease in the superficial femoral and popliteal arteries and occlusion of the anterior and posterior tibial arteries, but was considered poor candidate for intervention at this time. The wound hurts ninety percent of the time and has not healed. He is currently receiving weekly wound care from home health.   He developed hematuria several days after discharge for which was evaluated at ER 2 days ago. He was found to have several kidney stones on CT, a solid mass on the left kidney, and UTI with urine culture now growing enterobacter and pseudomonas. Enterobacter is sensitive to cefepime, pseudomonas sensitivities are still pending.     He has a history of bladder removal at [facility] due to a tumor and has had a catheter. He is followed at the cancer center, but not seen a urologist for a year or so.  He experiences weakness in his legs and must be cautious while walking. No dizziness or feeling woozy.       Medications: Show/hide medication list[1]     Objective    BP 113/63 (BP Location: Left Arm, Patient Position: Sitting, Cuff Size: Normal)   Pulse 75   Resp 16   Ht 5' 10 (1.778 m)   Wt 148 lb (67.1 kg)   SpO2 98%   BMI 21.24 kg/m   Physical Exam   General: Appearance:    Well developed, well nourished male in no acute distress  Eyes:    PERRL, conjunctiva/corneas clear, EOM's intact       Lungs:     Clear to auscultation bilaterally, respirations unlabored  Heart:    Normal heart rate. Regular rhythm.  3/6 systolic murmur   MS:   All extremities are intact.    Neurologic:   Awake, alert, oriented x 3. No apparent focal neurological defect.         Assessment & Plan       Urinary tract infection with hematuria in a patient with post-cystectomy and chronic catheter Recent hospitalization for urinary tract infection with hematuria. Culture confirmed infection. Chronic catheter in place since cystectomy two years ago. MRI planned to evaluate bleeding source. - Start cefuroxime  prescribed from ER for urinary tract infection. - Uti sensitivities pending.  - Referred to urologist for  further evaluation.  Peripheral artery disease with nonhealing left leg ulcer Mild peripheral artery disease in superficial femoral and popliteal arteries. Nonhealing ulcer on left leg due to insufficient blood flow. Not a candidate for revascularization due to multiple comorbidities. - Continue follow-up with wound clinic for ulcer management.  Atrial flutter Developed post-cardiac catheterization. Controlled with amiodarone , resolving hypotension. - Continue current medication regimen for atrial flutter.  Severe left  main coronary artery disease 70% proximal stenosis. Not a candidate for bypass grafting due to multiple comorbidities. - Continue follow-up with cardiologist on January 26th.  Aortic valve stenosis Moderate aortic valve stenosis noted during cardiac catheterization. - Continue follow-up with cardiologist on January 26th.  Nephrolithiasis Presence of kidney stones. MRI planned to assess for stones in bladder. - Ordered MRI to assess for kidney stones in bladder.   AKI (acute kidney injury)  Cystostomy in place [facility]) - Ambulatory referral to Urology  Left renal mass - MR Abdomen W Wo Contrast; Future   Return in about 1 month (around 05/10/2024).      Nancyann Perry, MD  [facility] [phone] (phone) [phone] (fax)  [facility]     [1]  Outpatient Medications Prior to Visit  Medication Sig   amiodarone  (PACERONE ) 200 MG tablet Take 2 tablets by mouth 2 times daily for 3 days, THEN 1 tablet 2 times daily for 7 days, THEN 1 tablet daily.   apixaban  (ELIQUIS ) 2.5 MG TABS tablet Take 1 tablet (2.5 mg total) by mouth 2 (two) times daily. (Patient taking differently: Take 5 mg by mouth 2 (two) times daily.)   aspirin  EC 81 MG tablet Take 81 mg by mouth daily. Swallow whole.   atorvastatin  (LIPITOR ) 80 MG tablet Take 1 tablet (80 mg total) by mouth daily.   budesonide -formoterol  (SYMBICORT ) 160-4.5 MCG/ACT inhaler Inhale 2 puffs into the lungs 2 (two) times daily.   cefUROXime  (CEFTIN ) 500 MG tablet Take 1 tablet (500 mg total) by mouth 2 (two) times daily with a meal for 7 days.   clopidogrel  (PLAVIX ) 75 MG tablet Take 75 mg by mouth daily.   ferrous sulfate  325 (65 FE) MG tablet Take 325 mg by mouth daily.   hydroxyurea  (HYDREA ) 500 MG capsule Take 500 mg by mouth daily.   levothyroxine  (SYNTHROID ) 50 MCG tablet Take 1 tablet (50 mcg total) by mouth daily. PLEASE NOTE CHANGE TO AND CANCEL ANY REMAINING REFILLS OF THE 25s (Patient  taking differently: Take 75 mcg by mouth daily.)   Multiple Vitamins-Minerals (MULTIVITAMIN ADULT PO) Take 1 tablet by mouth daily.   ondansetron  (ZOFRAN -ODT) 4 MG disintegrating tablet Take 1 tablet (4 mg total) by mouth every 8 (eight) hours as needed for nausea or vomiting.   oxyCODONE -acetaminophen  (PERCOCET) 10-325 MG tablet Take 1 tablet by mouth every 6 (six) hours as needed. for pain   polyethylene glycol powder (GLYCOLAX /MIRALAX ) 17 GM/SCOOP powder Take 17 g by mouth daily. Dissolve 1 capful (17g) in 4-8 ounces of liquid and take by mouth daily.   sodium bicarbonate  650 MG tablet Take 2 tablets (1,300 mg total) by mouth 2 (two) times daily.   valACYclovir  (VALTREX ) 1000 MG tablet TAKE 1 TABLET BY MOUTH TWICE A DAY AS NEEDED   No facility-administered medications prior to visit.   "

## 2024-04-09 NOTE — Patient Instructions (Signed)
 Jerry Mcpherson  Please review the attached list of medications and notify my office if there are any errors.   . Please bring all of your medications to every appointment so we can make sure that our medication list is the same as yours.

## 2024-04-10 LAB — URINE CULTURE: Culture: >=100000 — AB

## 2024-04-17 ENCOUNTER — Ambulatory Visit
Admission: RE | Admit: 2024-04-17 | Discharge: 2024-04-17 | Disposition: A | Discharge status: Home / Self Care | Source: Ambulatory Visit | Attending: Family Medicine | Admitting: Family Medicine

## 2024-04-17 DIAGNOSIS — N2889 Other specified disorders of kidney and ureter: Secondary | ICD-10-CM | POA: Insufficient documentation

## 2024-04-17 MED ORDER — GADOBUTROL 1 MMOL/ML IV SOLN
6.0000 mL | Freq: Once | INTRAVENOUS | Status: AC | PRN
  Administered 2024-04-17: 6 mL via INTRAVENOUS

## 2024-04-18 ENCOUNTER — Other Ambulatory Visit: Payer: Self-pay

## 2024-04-18 ENCOUNTER — Ambulatory Visit: Payer: Self-pay

## 2024-04-18 ENCOUNTER — Ambulatory Visit: Payer: Self-pay | Admitting: Family Medicine

## 2024-04-18 ENCOUNTER — Encounter: Payer: Self-pay | Admitting: Emergency Medicine

## 2024-04-18 ENCOUNTER — Emergency Department

## 2024-04-18 ENCOUNTER — Inpatient Hospital Stay: Attending: Oncology | Admitting: Hospice and Palliative Medicine

## 2024-04-18 ENCOUNTER — Inpatient Hospital Stay
Admission: EM | Admit: 2024-04-18 | Discharge: 2024-04-21 | DRG: 291 | Disposition: A | Attending: Internal Medicine | Admitting: Internal Medicine

## 2024-04-18 ENCOUNTER — Telehealth: Payer: Self-pay

## 2024-04-18 DIAGNOSIS — Z85820 Personal history of malignant melanoma of skin: Secondary | ICD-10-CM

## 2024-04-18 DIAGNOSIS — Z809 Family history of malignant neoplasm, unspecified: Secondary | ICD-10-CM

## 2024-04-18 DIAGNOSIS — N179 Acute kidney failure, unspecified: Secondary | ICD-10-CM | POA: Diagnosis present

## 2024-04-18 DIAGNOSIS — H9190 Unspecified hearing loss, unspecified ear: Secondary | ICD-10-CM | POA: Diagnosis present

## 2024-04-18 DIAGNOSIS — I080 Rheumatic disorders of both mitral and aortic valves: Secondary | ICD-10-CM | POA: Diagnosis present

## 2024-04-18 DIAGNOSIS — Z8551 Personal history of malignant neoplasm of bladder: Secondary | ICD-10-CM | POA: Diagnosis not present

## 2024-04-18 DIAGNOSIS — Z7902 Long term (current) use of antithrombotics/antiplatelets: Secondary | ICD-10-CM

## 2024-04-18 DIAGNOSIS — I4892 Unspecified atrial flutter: Secondary | ICD-10-CM | POA: Diagnosis present

## 2024-04-18 DIAGNOSIS — M5410 Radiculopathy, site unspecified: Secondary | ICD-10-CM

## 2024-04-18 DIAGNOSIS — I5023 Acute on chronic systolic (congestive) heart failure: Secondary | ICD-10-CM | POA: Diagnosis not present

## 2024-04-18 DIAGNOSIS — Z7901 Long term (current) use of anticoagulants: Secondary | ICD-10-CM

## 2024-04-18 DIAGNOSIS — I480 Paroxysmal atrial fibrillation: Secondary | ICD-10-CM | POA: Diagnosis present

## 2024-04-18 DIAGNOSIS — L97529 Non-pressure chronic ulcer of other part of left foot with unspecified severity: Secondary | ICD-10-CM | POA: Diagnosis present

## 2024-04-18 DIAGNOSIS — E039 Hypothyroidism, unspecified: Secondary | ICD-10-CM | POA: Diagnosis present

## 2024-04-18 DIAGNOSIS — I255 Ischemic cardiomyopathy: Secondary | ICD-10-CM | POA: Diagnosis present

## 2024-04-18 DIAGNOSIS — Z87891 Personal history of nicotine dependence: Secondary | ICD-10-CM

## 2024-04-18 DIAGNOSIS — I350 Nonrheumatic aortic (valve) stenosis: Secondary | ICD-10-CM | POA: Diagnosis not present

## 2024-04-18 DIAGNOSIS — Z5982 Transportation insecurity: Secondary | ICD-10-CM

## 2024-04-18 DIAGNOSIS — Z8249 Family history of ischemic heart disease and other diseases of the circulatory system: Secondary | ICD-10-CM

## 2024-04-18 DIAGNOSIS — I130 Hypertensive heart and chronic kidney disease with heart failure and stage 1 through stage 4 chronic kidney disease, or unspecified chronic kidney disease: Secondary | ICD-10-CM | POA: Diagnosis present

## 2024-04-18 DIAGNOSIS — J9620 Acute and chronic respiratory failure, unspecified whether with hypoxia or hypercapnia: Secondary | ICD-10-CM | POA: Diagnosis not present

## 2024-04-18 DIAGNOSIS — I959 Hypotension, unspecified: Secondary | ICD-10-CM | POA: Diagnosis present

## 2024-04-18 DIAGNOSIS — I2489 Other forms of acute ischemic heart disease: Secondary | ICD-10-CM | POA: Diagnosis present

## 2024-04-18 DIAGNOSIS — I10 Essential (primary) hypertension: Secondary | ICD-10-CM | POA: Diagnosis not present

## 2024-04-18 DIAGNOSIS — N12 Tubulo-interstitial nephritis, not specified as acute or chronic: Secondary | ICD-10-CM | POA: Diagnosis not present

## 2024-04-18 DIAGNOSIS — R310 Gross hematuria: Secondary | ICD-10-CM | POA: Diagnosis not present

## 2024-04-18 DIAGNOSIS — Z66 Do not resuscitate: Secondary | ICD-10-CM | POA: Diagnosis present

## 2024-04-18 DIAGNOSIS — N2581 Secondary hyperparathyroidism of renal origin: Secondary | ICD-10-CM | POA: Diagnosis present

## 2024-04-18 DIAGNOSIS — Z881 Allergy status to other antibiotic agents status: Secondary | ICD-10-CM

## 2024-04-18 DIAGNOSIS — N136 Pyonephrosis: Secondary | ICD-10-CM | POA: Diagnosis present

## 2024-04-18 DIAGNOSIS — I70229 Atherosclerosis of native arteries of extremities with rest pain, unspecified extremity: Secondary | ICD-10-CM | POA: Diagnosis present

## 2024-04-18 DIAGNOSIS — Z86718 Personal history of other venous thrombosis and embolism: Secondary | ICD-10-CM

## 2024-04-18 DIAGNOSIS — D631 Anemia in chronic kidney disease: Secondary | ICD-10-CM | POA: Diagnosis present

## 2024-04-18 DIAGNOSIS — I5084 End stage heart failure: Secondary | ICD-10-CM | POA: Diagnosis present

## 2024-04-18 DIAGNOSIS — I509 Heart failure, unspecified: Principal | ICD-10-CM

## 2024-04-18 DIAGNOSIS — I440 Atrioventricular block, first degree: Secondary | ICD-10-CM | POA: Diagnosis present

## 2024-04-18 DIAGNOSIS — I25118 Atherosclerotic heart disease of native coronary artery with other forms of angina pectoris: Secondary | ICD-10-CM | POA: Diagnosis not present

## 2024-04-18 DIAGNOSIS — N183 Chronic kidney disease, stage 3 unspecified: Secondary | ICD-10-CM

## 2024-04-18 DIAGNOSIS — D45 Polycythemia vera: Secondary | ICD-10-CM | POA: Diagnosis present

## 2024-04-18 DIAGNOSIS — B965 Pseudomonas (aeruginosa) (mallei) (pseudomallei) as the cause of diseases classified elsewhere: Secondary | ICD-10-CM | POA: Diagnosis present

## 2024-04-18 DIAGNOSIS — Z79899 Other long term (current) drug therapy: Secondary | ICD-10-CM

## 2024-04-18 DIAGNOSIS — Z95820 Peripheral vascular angioplasty status with implants and grafts: Secondary | ICD-10-CM

## 2024-04-18 DIAGNOSIS — J449 Chronic obstructive pulmonary disease, unspecified: Secondary | ICD-10-CM | POA: Diagnosis present

## 2024-04-18 DIAGNOSIS — Z7982 Long term (current) use of aspirin: Secondary | ICD-10-CM

## 2024-04-18 DIAGNOSIS — E785 Hyperlipidemia, unspecified: Secondary | ICD-10-CM | POA: Diagnosis present

## 2024-04-18 DIAGNOSIS — Z955 Presence of coronary angioplasty implant and graft: Secondary | ICD-10-CM

## 2024-04-18 DIAGNOSIS — Z8603 Personal history of neoplasm of uncertain behavior: Secondary | ICD-10-CM | POA: Diagnosis not present

## 2024-04-18 DIAGNOSIS — R0602 Shortness of breath: Secondary | ICD-10-CM | POA: Diagnosis present

## 2024-04-18 DIAGNOSIS — Z888 Allergy status to other drugs, medicaments and biological substances status: Secondary | ICD-10-CM

## 2024-04-18 DIAGNOSIS — D72829 Elevated white blood cell count, unspecified: Secondary | ICD-10-CM | POA: Diagnosis not present

## 2024-04-18 DIAGNOSIS — I5043 Acute on chronic combined systolic (congestive) and diastolic (congestive) heart failure: Secondary | ICD-10-CM | POA: Diagnosis present

## 2024-04-18 DIAGNOSIS — Z906 Acquired absence of other parts of urinary tract: Secondary | ICD-10-CM

## 2024-04-18 DIAGNOSIS — Z8673 Personal history of transient ischemic attack (TIA), and cerebral infarction without residual deficits: Secondary | ICD-10-CM

## 2024-04-18 DIAGNOSIS — I482 Chronic atrial fibrillation, unspecified: Secondary | ICD-10-CM | POA: Diagnosis present

## 2024-04-18 DIAGNOSIS — Z7951 Long term (current) use of inhaled steroids: Secondary | ICD-10-CM

## 2024-04-18 DIAGNOSIS — Z825 Family history of asthma and other chronic lower respiratory diseases: Secondary | ICD-10-CM

## 2024-04-18 DIAGNOSIS — Z823 Family history of stroke: Secondary | ICD-10-CM

## 2024-04-18 DIAGNOSIS — Z7189 Other specified counseling: Secondary | ICD-10-CM | POA: Diagnosis not present

## 2024-04-18 DIAGNOSIS — N184 Chronic kidney disease, stage 4 (severe): Secondary | ICD-10-CM | POA: Diagnosis present

## 2024-04-18 DIAGNOSIS — N1832 Chronic kidney disease, stage 3b: Secondary | ICD-10-CM | POA: Diagnosis not present

## 2024-04-18 DIAGNOSIS — R54 Age-related physical debility: Secondary | ICD-10-CM | POA: Diagnosis present

## 2024-04-18 DIAGNOSIS — Z91013 Allergy to seafood: Secondary | ICD-10-CM

## 2024-04-18 DIAGNOSIS — Z515 Encounter for palliative care: Secondary | ICD-10-CM | POA: Diagnosis not present

## 2024-04-18 DIAGNOSIS — G589 Mononeuropathy, unspecified: Secondary | ICD-10-CM

## 2024-04-18 DIAGNOSIS — I70222 Atherosclerosis of native arteries of extremities with rest pain, left leg: Secondary | ICD-10-CM | POA: Diagnosis present

## 2024-04-18 DIAGNOSIS — Z79890 Hormone replacement therapy (postmenopausal): Secondary | ICD-10-CM

## 2024-04-18 LAB — CBC
HCT: 41.1 % (ref 39.0–52.0)
Hemoglobin: 12.2 g/dL — ABNORMAL LOW (ref 13.0–17.0)
MCH: 26.5 pg (ref 26.0–34.0)
MCHC: 29.7 g/dL — ABNORMAL LOW (ref 30.0–36.0)
MCV: 89.3 fL (ref 80.0–100.0)
Platelets: 551 K/uL — ABNORMAL HIGH (ref 150–400)
RBC: 4.6 MIL/uL (ref 4.22–5.81)
RDW: 26 % — ABNORMAL HIGH (ref 11.5–15.5)
WBC: 17.3 K/uL — ABNORMAL HIGH (ref 4.0–10.5)
nRBC: 0 % (ref 0.0–0.2)

## 2024-04-18 LAB — URINALYSIS, ROUTINE W REFLEX MICROSCOPIC
Bilirubin Urine: NEGATIVE
Glucose, UA: NEGATIVE mg/dL
Ketones, ur: NEGATIVE mg/dL
Nitrite: POSITIVE — AB
Protein, ur: NEGATIVE mg/dL
RBC / HPF: >50 RBC/hpf (ref 0–5)
Specific Gravity, Urine: 1.005 (ref 1.005–1.030)
Squamous Epithelial / HPF: 0 /HPF (ref 0–5)
pH: 7 (ref 5.0–8.0)

## 2024-04-18 LAB — TROPONIN T, HIGH SENSITIVITY
Troponin T High Sensitivity: 37 ng/L — ABNORMAL HIGH (ref 0–19)
Troponin T High Sensitivity: 35 ng/L — ABNORMAL HIGH (ref 0–19)

## 2024-04-18 LAB — BASIC METABOLIC PANEL WITH GFR
Anion gap: 11 (ref 5–15)
BUN: 40 mg/dL — ABNORMAL HIGH (ref 8–23)
CO2: 23 mmol/L (ref 22–32)
Calcium: 8.8 mg/dL — ABNORMAL LOW (ref 8.9–10.3)
Chloride: 107 mmol/L (ref 98–111)
Creatinine, Ser: 2.14 mg/dL — ABNORMAL HIGH (ref 0.61–1.24)
GFR, Estimated: 30 mL/min — ABNORMAL LOW
Glucose, Bld: 174 mg/dL — ABNORMAL HIGH (ref 70–99)
Potassium: 4.5 mmol/L (ref 3.5–5.1)
Sodium: 141 mmol/L (ref 135–145)

## 2024-04-18 LAB — PROTIME-INR
INR: 1.3 — ABNORMAL HIGH (ref 0.8–1.2)
Prothrombin Time: 16.6 s — ABNORMAL HIGH (ref 11.4–15.2)

## 2024-04-18 LAB — PRO BRAIN NATRIURETIC PEPTIDE: Pro Brain Natriuretic Peptide: 25472 pg/mL — ABNORMAL HIGH

## 2024-04-18 MED ORDER — LEVOTHYROXINE SODIUM 50 MCG PO TABS
75.0000 ug | ORAL_TABLET | Freq: Every day | ORAL | Status: DC
  Administered 2024-04-19 – 2024-04-20 (×2): 75 ug via ORAL
  Filled 2024-04-18 (×4): qty 1

## 2024-04-18 MED ORDER — FUROSEMIDE 10 MG/ML IJ SOLN
40.0000 mg | Freq: Once | INTRAMUSCULAR | Status: AC
  Administered 2024-04-18: 40 mg via INTRAVENOUS
  Filled 2024-04-18: qty 4

## 2024-04-18 MED ORDER — ONDANSETRON HCL 4 MG PO TABS: 4.0000 mg | ORAL_TABLET | Freq: Four times a day (QID) | ORAL | Status: DC | PRN

## 2024-04-18 MED ORDER — AMIODARONE HCL 200 MG PO TABS
200.0000 mg | ORAL_TABLET | Freq: Every day | ORAL | Status: DC
  Administered 2024-04-19 – 2024-04-21 (×3): 200 mg via ORAL
  Filled 2024-04-18 (×3): qty 1

## 2024-04-18 MED ORDER — FERROUS SULFATE 325 (65 FE) MG PO TABS
325.0000 mg | ORAL_TABLET | Freq: Every day | ORAL | Status: DC
  Administered 2024-04-19 – 2024-04-21 (×3): 325 mg via ORAL
  Filled 2024-04-18 (×3): qty 1

## 2024-04-18 MED ORDER — ADULT MULTIVITAMIN W/MINERALS CH
1.0000 | ORAL_TABLET | Freq: Every day | ORAL | Status: DC
  Administered 2024-04-19 – 2024-04-21 (×3): 1 via ORAL
  Filled 2024-04-18 (×3): qty 1

## 2024-04-18 MED ORDER — APIXABAN 2.5 MG PO TABS
2.5000 mg | ORAL_TABLET | Freq: Two times a day (BID) | ORAL | Status: DC
  Administered 2024-04-18 – 2024-04-21 (×6): 2.5 mg via ORAL
  Filled 2024-04-18 (×6): qty 1

## 2024-04-18 MED ORDER — SODIUM CHLORIDE 0.9% FLUSH
3.0000 mL | Freq: Two times a day (BID) | INTRAVENOUS | Status: DC
  Administered 2024-04-18 – 2024-04-21 (×6): 3 mL via INTRAVENOUS

## 2024-04-18 MED ORDER — MULTIVITAMIN ADULT PO TABS: ORAL_TABLET | Freq: Every day | ORAL | Status: DC

## 2024-04-18 MED ORDER — HYDROXYUREA 500 MG PO CAPS
500.0000 mg | ORAL_CAPSULE | Freq: Every day | ORAL | Status: DC
  Administered 2024-04-19 – 2024-04-21 (×3): 500 mg via ORAL
  Filled 2024-04-18 (×3): qty 1

## 2024-04-18 MED ORDER — FLUTICASONE FUROATE-VILANTEROL 100-25 MCG/ACT IN AEPB
1.0000 | INHALATION_SPRAY | Freq: Every day | RESPIRATORY_TRACT | Status: DC
  Administered 2024-04-18 – 2024-04-19 (×2): 1 via RESPIRATORY_TRACT
  Filled 2024-04-18: qty 28

## 2024-04-18 MED ORDER — ATORVASTATIN CALCIUM 80 MG PO TABS
80.0000 mg | ORAL_TABLET | Freq: Every day | ORAL | Status: DC
  Administered 2024-04-19 – 2024-04-21 (×3): 80 mg via ORAL
  Filled 2024-04-18: qty 4
  Filled 2024-04-18 (×2): qty 1

## 2024-04-18 MED ORDER — SODIUM BICARBONATE 650 MG PO TABS
1300.0000 mg | ORAL_TABLET | Freq: Two times a day (BID) | ORAL | Status: DC
  Administered 2024-04-18 – 2024-04-21 (×6): 1300 mg via ORAL
  Filled 2024-04-18 (×6): qty 2

## 2024-04-18 MED ORDER — LEVOTHYROXINE SODIUM 50 MCG PO TABS: 75.0000 ug | ORAL_TABLET | Freq: Every day | ORAL | Status: DC

## 2024-04-18 MED ORDER — ACETAMINOPHEN 650 MG RE SUPP: 650.0000 mg | Freq: Four times a day (QID) | RECTAL | Status: DC | PRN

## 2024-04-18 MED ORDER — OXYCODONE-ACETAMINOPHEN 10-325 MG PO TABS: 1.0000 | ORAL_TABLET | Freq: Four times a day (QID) | ORAL | Status: DC | PRN

## 2024-04-18 MED ORDER — ASPIRIN 81 MG PO TBEC
81.0000 mg | DELAYED_RELEASE_TABLET | Freq: Every day | ORAL | Status: DC
  Administered 2024-04-19 – 2024-04-20 (×2): 81 mg via ORAL
  Filled 2024-04-18 (×2): qty 1

## 2024-04-18 MED ORDER — POLYETHYLENE GLYCOL 3350 17 GM/SCOOP PO POWD
17.0000 g | Freq: Every day | ORAL | Status: DC
  Administered 2024-04-18 – 2024-04-19 (×2): 17 g via ORAL
  Filled 2024-04-18: qty 119

## 2024-04-18 MED ORDER — ONDANSETRON HCL 4 MG/2ML IJ SOLN: 4.0000 mg | Freq: Four times a day (QID) | INTRAMUSCULAR | Status: DC | PRN

## 2024-04-18 MED ORDER — OXYCODONE-ACETAMINOPHEN 5-325 MG PO TABS
1.0000 | ORAL_TABLET | Freq: Four times a day (QID) | ORAL | Status: DC | PRN
  Administered 2024-04-18: 1 via ORAL
  Filled 2024-04-18: qty 1

## 2024-04-18 MED ORDER — ACETAMINOPHEN 325 MG PO TABS
650.0000 mg | ORAL_TABLET | Freq: Four times a day (QID) | ORAL | Status: DC | PRN
  Administered 2024-04-19 – 2024-04-21 (×5): 650 mg via ORAL
  Filled 2024-04-18 (×5): qty 2

## 2024-04-18 MED ORDER — CLOPIDOGREL BISULFATE 75 MG PO TABS
75.0000 mg | ORAL_TABLET | Freq: Every day | ORAL | Status: DC
  Administered 2024-04-19 – 2024-04-21 (×3): 75 mg via ORAL
  Filled 2024-04-18 (×3): qty 1

## 2024-04-18 MED ORDER — OXYCODONE HCL 5 MG PO TABS: 5.0000 mg | ORAL_TABLET | Freq: Four times a day (QID) | ORAL | Status: DC | PRN

## 2024-04-18 MED ORDER — SODIUM CHLORIDE 0.9 % IV SOLN
2.0000 g | INTRAVENOUS | Status: DC
  Administered 2024-04-18 – 2024-04-20 (×3): 2 g via INTRAVENOUS
  Filled 2024-04-18 (×3): qty 12.5

## 2024-04-18 NOTE — Telephone Encounter (Signed)
 Copied from CRM [id number]. Topic: Clinical - Lab/Test Results >> Apr 18, 2024  1:50 PM [name] wrote: Reason for CRM: Patient is in the ER asking about the MRI results what was done yesterday.  Patient would like to speak to a nurse when results are in

## 2024-04-18 NOTE — Progress Notes (Signed)
 Multidisciplinary Oncology Council Documentation  Jerry Mcpherson was presented by our [facility] on 04/18/2024, which included representatives from:  Palliative Care Dietitian  Physical/Occupational Therapist Nurse Navigator Genetics Social work Survivorship RN Financial Navigator Research RN   Jerry Mcpherson currently presents with history of polycythemia vera  We reviewed previous medical and familial history, history of present illness, and recent lab results along with all available histopathologic and imaging studies. The MOC considered available treatment options and made the following recommendations/referrals:  community palliative care, SW  The MOC is a meeting of clinicians from various specialty areas who evaluate and discuss patients for whom a multidisciplinary approach is being considered. Final determinations in the plan of care are those of the provider(s).   Today's extended care, comprehensive team conference, Jerry Mcpherson was not present for the discussion and was not examined.

## 2024-04-18 NOTE — ED Notes (Signed)
 Called nurse to get a report regarding patient transfer over to Pod C. Stated only gave Lasix  and antibiotic did nto give anything else because pharmacy had not verify medications.

## 2024-04-18 NOTE — ED Notes (Signed)
 RN called lab to add on troponin and BNP due to labs not being in process.

## 2024-04-18 NOTE — H&P (Signed)
 " History and Physical    Patient: Jerry Mcpherson FMW:[id number] DOB: 21-Oct-1940 DOA: 04/18/2024 DOS: the patient was seen and examined on 04/18/2024 PCP: [name], MD  Patient coming from: Home-lives alone  Chief Complaint:  Chief Complaint  Patient presents with   Shortness of Breath   HPI: Jerry Mcpherson is a 84 y.o. male with medical history significant of atrial fibrillation on amiodarone , moderate aortic stenosis, ischemic cardiomyopathy with an EF of 30 to 35% with associated diastolic dysfunction, hypertension, chronic leukocytosis secondary to polycythemia vera, hypothyroidism, prior bladder cancer status post cystectomy with a chronic urostomy tube.  Has a history of chronic kidney disease and worsening renal function after diuresis during previous admission in December 2025.  Patient presented to the ED today with 2 complaints.  Primarily issues related to shortness of breath and orthopnea as well as lower extremity edema.  He also was complaining of significant hematuria and had undergone an MRI ordered by his primary care physician on 1/6 with results pending.  Patient reports having 2 months of lower extremity edema and shortness of breath and this encompasses the timeframe from when she was in the hospital last month.  He states has worsened over the past 2 days and he is having to sleep more in his recliner than previous.  Because of shortness of breath he cannot sleep.  He was not sent home on a diuretic secondary to progressive renal dysfunction during previous hospitalization.  According to the discharge summary patient was to follow-up with nephrologist Dr. Marcelino and his primary cardiologist Dr. Budd after discharge.  During my examination he was reporting some shortness of breath but was able to speak in full sentences.  He was rather animated.  He was talking about situations at his home and interactions he has had with home health staff.  He had perceived some  staff as trying to boss him around in his own home and that frustrated him.  Overall he has had good success with recent home health visits.  In the ED he was afebrile, white count was 17,300 which was in his typical range.  He was borderline hypertensive and was in sinus rhythm.  Renal function was improved from time of discharge with a creatinine of 2.14 down from 2.9 on date of discharge.  proBNP was elevated at 25,472 and he had a mildly evaded troponin T high-sensitivity of 37.  Chest x-ray was negative for acute cardiopulmonary disease.  Hospital service has been asked to evaluate the patient for admission.   Review of Systems: As mentioned in the history of present illness. All other systems reviewed and are negative.  Shortness of breath edema and orthopnea progressive over the past 2 days.  Gross hematuria for 3 to 4 days.  Continues to have sacral wound managed by his wound doctor.  Tells me he feels like he might function better at home if he qualified for oxygen.   Past Medical History:  Diagnosis Date   Acute deep vein thrombosis (DVT) of right femoral vein (HCC) 10/28/2020   US  10/24/2020 [facility] Acute right lower extremity DVT extending from the common femoral vein to  the popliteal vein.     Bladder cancer (HCC)    Genital herpes    Hyperglycemia    Hypertension    Squamous cell carcinoma    Past Surgical History:  Procedure Laterality Date   CATARACT EXTRACTION Left 10/2012   Dr. Lethea; [facility]   CYSTECTOMY W/ URETEROILEAL  CONDUIT  07/24/2020   [facility] for Stage IV bladder cancer extending to prostate   EYE SURGERY Left    removed a piece of steel   FEMORAL ARTERY STENT Left 06/16/2022   [facility] stent[facility]   HERNIA REPAIR  12/08/2006   umbilical hernia, incarderated; Dr. Dessa   LOWER EXTREMITY ANGIOGRAPHY Left 03/26/2024   Procedure: Lower Extremity Angiography;  Surgeon: Marea Selinda RAMAN, MD;  Location: [facility] INVASIVE CV LAB;  Service: Cardiovascular;   Laterality: Left;   PROSTATECTOMY  4/14/2-22   [facility] for Stage IV bladder cancer extending to prostate   RIGHT/LEFT HEART CATH AND CORONARY ANGIOGRAPHY Bilateral 03/23/2024   Procedure: RIGHT/LEFT HEART CATH AND CORONARY ANGIOGRAPHY;  Surgeon: Mady Bruckner, MD;  Location: [facility] INVASIVE CV LAB;  Service: Cardiovascular;  Laterality: Bilateral;   ROTATOR CUFF REPAIR     SQUAMOUS CELL CARCINOMA EXCISION  2008   TONSILLECTOMY     Social History:  reports that he quit smoking about 56 years ago. His smoking use included cigarettes. He started smoking about 71 years ago. He has a 45 pack-year smoking history. He has never used smokeless tobacco. He reports that he does not drink alcohol and does not use drugs.  Allergies[1]  Family History  Problem Relation Age of Onset   Hypertension Mother    Heart attack Mother    Stroke Father    Cancer Father    COPD Sister    Congestive Heart Failure Brother     Prior to Admission medications  Medication Sig Start Date End Date Taking? Authorizing Provider  amiodarone  (PACERONE ) 200 MG tablet Take 2 tablets by mouth 2 times daily for 3 days, THEN 1 tablet 2 times daily for 7 days, THEN 1 tablet daily. 04/02/24 05/12/24  Dorinda Drue DASEN, MD  apixaban  (ELIQUIS ) 2.5 MG TABS tablet Take 1 tablet (2.5 mg total) by mouth 2 (two) times daily. Patient taking differently: Take 5 mg by mouth 2 (two) times daily. 12/20/23   Alexander, Natalie, DO  aspirin  EC 81 MG tablet Take 81 mg by mouth daily. Swallow whole.    [provider]  atorvastatin  (LIPITOR ) 80 MG tablet Take 1 tablet (80 mg total) by mouth daily. 04/01/24   Dorinda Drue DASEN, MD  budesonide -formoterol  (SYMBICORT ) 160-4.5 MCG/ACT inhaler Inhale 2 puffs into the lungs 2 (two) times daily.    [provider]  clopidogrel  (PLAVIX ) 75 MG tablet Take 75 mg by mouth daily.    [provider]  ferrous sulfate  325 (65 FE) MG tablet Take 325 mg by mouth daily.    [provider]  hydroxyurea  (HYDREA ) 500 MG capsule Take 500 mg by mouth daily.    [provider]  levothyroxine  (SYNTHROID ) 50 MCG tablet Take 1 tablet (50 mcg total) by mouth daily. PLEASE NOTE CHANGE TO AND CANCEL ANY REMAINING REFILLS OF THE 25s Patient taking differently: Take 75 mcg by mouth daily. 02/02/24   [name], MD  Multiple Vitamins-Minerals (MULTIVITAMIN ADULT PO) Take 1 tablet by mouth daily.    [provider]  ondansetron  (ZOFRAN -ODT) 4 MG disintegrating tablet Take 1 tablet (4 mg total) by mouth every 8 (eight) hours as needed for nausea or vomiting. 04/02/24   [name], MD  oxyCODONE -acetaminophen  (PERCOCET) 10-325 MG tablet Take 1 tablet by mouth every 6 (six) hours as needed. for pain 03/30/24   [name], MD  polyethylene glycol powder (GLYCOLAX /MIRALAX ) 17 GM/SCOOP powder Take 17 g by mouth daily. Dissolve  1 capful (17g) in 4-8 ounces of liquid and take by mouth daily. 03/31/24   Dorinda Drue DASEN, MD  sodium bicarbonate  650 MG tablet Take 2 tablets (1,300 mg total) by mouth 2 (two) times daily. 03/31/24   Dorinda Drue DASEN, MD  valACYclovir  (VALTREX ) 1000 MG tablet TAKE 1 TABLET BY MOUTH TWICE A DAY AS NEEDED 10/02/23   [name], MD    Physical Exam: Vitals:   04/18/24 1330 04/18/24 1400 04/18/24 1500 04/18/24 1537  BP: 136/84 (!) 135/96 (!) 142/85   Pulse: 77 72 80   Resp: (!) 23 17 (!) 23   Temp:    (!) 97.4 F (36.3 C)  TempSrc:    Oral  SpO2: 97% 100% 100%    Constitutional: NAD, calm, comfortable Neck: normal, supple, no masses, no thyromegaly; moderate JVD Respiratory: Diffuse crackles throughout all lung fields.  Normal respiratory effort. No accessory muscle use.  Room air with resting O2 sats 95 to 100% Cardiovascular: Regular rate and rhythm, positive tight murmur consistent with known aortic stenosis; no rubs / gallops.  Bilateral 2+ lower extremity edema. 2+ pedal pulses.   Abdomen: no tenderness, no masses palpated.  No hepatosplenomegaly. Bowel sounds positive.  Genitourinary: Urostomy bag in place with urine and gross hematuria noted Musculoskeletal: no clubbing / cyanosis. No joint deformity upper and lower extremities. Good ROM, no contractures. Normal muscle tone.  Skin: no rashes, lesions, ulcers. No induration; known sacral decubitus not examined Neurologic: CN 2-12 grossly intact. Sensation intact, Strength 5/5 x all 4 extremities.  Psychiatric: Normal judgment and insight. Alert and oriented x 3. Normal mood.    Data Reviewed:  Sodium 141, potassium 4.5, chloride 107, CO2 23, glucose 174, BUN 40, creatinine 2.14, calcium  8.8, GFR 30  proBNP 25,472, troponin T high-sensitivity 37  WBC 17,300, hemoglobin 12.2, platelets 551,000, pro time 16.6, INR 1.3  Chest x-ray as above  Outpatient MRI abdomen reviewed and there are multiple nonacute findings.  The acute finding noted is a 2.2 x 3 cm area in the right kidney to represent focal pyelonephritis.  Please see the MRI for details regarding the other nonacute abnormalities  Assessment and Plan: Acute on chronic combined ischemic heart failure End-stage heart failure Moderate aortic stenosis Patient presented with shortness of breath, dyspnea on exertion and orthopnea as well as progressive lower extremity edema Echo October 2025 with an EF of 30 to 35%, LVH, diastolic dysfunction grade 1 and moderate low-flow low gradient aortic stenosis Was diuresed during recent hospitalization in December but due to progressive worsening of renal function diuretics were not prescribed at discharge Patient has received Lasix  40 mg IV x 1 by EDP and has had about 300 cc out.  Will defer additional doses of diuretic to either cardiology or nephrology.  It seems patient would benefit from outpatient diuretics but this may need to be done on a weight gain basis alone Not on ARB or ACE inhibitor secondary to poor renal function as well as history of  angioedema Metoprolol  and Norvasc  were held secondary to suboptimal blood pressure readings prior to discharge in December GDMT also on hold secondary to renal dysfunction and hypotension In regards to aortic stenosis mean gradient 20 mmHg with a valve area of 1.39 cm with moderate AAS on cardiac catheterization this past December and was deemed not a candidate for intervention Will check ambulatory O2 sats on room air to see if patient qualifies for oxygen.  Unclear if given his end-stage heart failure if  that alone would qualify him for home O2. **During previous admission palliative medicine was consulted for goals of care.  Multiple discussions were held with the patient but at that time he was not ready to change CODE STATUS to DNR.  During the admission process I had a lengthy discussion with the patient explaining how all of multiple medical problems as listed above and below are in total contributing to all of his medical problems and his decreased quality of life.  I informed him we would focus on trying to maximize medical therapy without causing problems.  Example given was need to diurese so he could breathe better without hurting his kidneys.  I told him this may not be achievable but we would try.  I reemphasized twice that even if we make him a DO NOT RESUSCITATE that does not mean we would not continue to try to treat his medical conditions as much as possible.  I discussed with him that if we resuscitated him he likely would not survive and in the event that he possibly would survive he likely would not function the same as he was prior to the resuscitation event.  He verbalized understanding and was agreeable to another palliative medicine consultation.  CAD not amenable to intervention Patient has known 70% left main lesion as well as mid to distal RCA lesions 35% He was evaluated by interventional cardiology and felt not to be a good candidate either for PCI or CABG Currently not  complaining of chest pain Troponin T only mildly elevated and more consistent with demand ischemia from heart failure exacerbation  Gross hematuria with MRI suggestive of focal pyelonephritis on the right History of bladder cancer status post cystectomy and urostomy MRI this admission does demonstrate an area of focal pyelonephritis UA and urine culture from urostomy pending collection History since October of Klebsiella, Citrobacter, Enterobacter and Pseudomonas UTI all sensitive to cefepime  therefore I have asked pharmacy to dose cefepime   PAD History of critical limb ischemia on the left and nonhealing ulcer left foot Angiography done in December per vascular surgery team and revealed patent SFA stent with severe disease below the knee on the left with no reconstitution of occluded tibial vessels and only options are for medical therapy and symptom management related to claudication pain  Chronic atrial fibrillation  Currently maintaining sinus rhythm/borderline first-degree AV block on amiodarone  Continue this medication Will continue Eliquis  but at 2.5 mg given recent hematuria as well as decreased GFR but will need to monitor for worsening hematuria  CKD stage IIIb Creatinine improved from date of discharge with a GFR of 30 Nephrology has been consulted in context of need to diurese and establish diuretic protocol prior to discharge  Hypertension Currently borderline hypertensive Will wait on recommendations from cardiology before resuming previous Norvasc  and metoprolol   Polycythemia vera with associated chronic leukocytosis WBC at baseline Continue hydroxyurea   Hypothyroidism During previous admission TSH was 9.61 Continue Synthroid  at 75 mcg daily Patient may need education to ensure he is taking Synthroid  on an empty stomach and waits 1 hour before eating      Advance Care Planning:   Code Status: Full Code   VTE prophylaxis: Eliquis   Consults: Nephrology,  cardiology  Family Communication: Patient only  Severity of Illness: The appropriate patient status for this patient is INPATIENT. Inpatient status is judged to be reasonable and necessary in order to provide the required intensity of service to ensure the patient's safety. The patient's presenting symptoms, physical exam findings,  and initial radiographic and laboratory data in the context of their chronic comorbidities is felt to place them at high risk for further clinical deterioration. Furthermore, it is not anticipated that the patient will be medically stable for discharge from the hospital within 2 midnights of admission.   * I certify that at the point of admission it is my clinical judgment that the patient will require inpatient hospital care spanning beyond 2 midnights from the point of admission due to high intensity of service, high risk for further deterioration and high frequency of surveillance required.*  Author: Isaiah Lever, NP 04/18/2024 4:04 PM  For on call review [email or web address].     [1]  Allergies Allergen Reactions   Losartan Potassium Swelling    angioedema Other reaction(s): Angioedema   Shellfish Allergy Swelling and Rash    Rash, itching, swelling   Currently eats SHELLFISH without symptoms   Shellfish Protein-Containing Drug Products Dermatitis and Swelling    Rash, itching, swelling    Rash, itching, swelling   Currently eats SHELLFISH without symptoms   Doxycycline  Other (See Comments)    Blisters on hand   Ultram  [Tramadol Hcl] Nausea And Vomiting   Celecoxib Itching, Swelling, Rash and Other (See Comments)    Cannot take when eating shellfish   Codeine Palpitations    Heart racing   Cyclobenzaprine Other (See Comments)    Musculoskeletal therapy agents cause Excessive sedation Musculoskeletal therapy agents cause excessive sedation   Tramadol Hcl Nausea And Vomiting   "

## 2024-04-18 NOTE — ED Triage Notes (Addendum)
 Pt reports SHOB and leg swelling that started 2 months ago. Pt reports it has gotten worse the last 2 days.. Reports the [facility] is worse at night why lying. Pt reports he has a bad heart valve. Pt reports he was put on a medication to help the valve open.

## 2024-04-18 NOTE — ED Provider Notes (Signed)
 "  [facility] Provider Note    Event Date/Time   First MD Initiated Contact with Patient 04/18/24 1218     (approximate)   History   Shortness of Breath   HPI  Jerry Mcpherson is a 84 y.o. male with a past medical history of DVT currently on Eliquis , CHF, atrial fibrillation on anticoagulation, polycythemia vera, hypertension, cystostomy in place, presenting to the emergency department complaining of shortness of breath and bilateral leg swelling that has been ongoing for the last 2 months but has worsened over the last 2 days.  Patient reports that symptoms are worse when he is lying flat at night or when he he is ambulating.  He states that he was told he has a bad heart valve and that this is what is causing his symptoms as well as difficulty in healing a wound on his leg.     Physical Exam   Triage Vital Signs: ED Triage Vitals [04/18/24 1135]  Encounter Vitals Group     BP 113/70     Girls Systolic BP Percentile      Girls Diastolic BP Percentile      Boys Systolic BP Percentile      Boys Diastolic BP Percentile      Pulse Rate 80     Resp 17     Temp 98.5 F (36.9 C)     Temp Source Oral     SpO2 95 %     Weight      Height      Head Circumference      Peak Flow      Pain Score 0     Pain Loc      Pain Education      Exclude from Growth Chart     Most recent vital signs: Vitals:   04/18/24 1135 04/18/24 1136  BP: 113/70   Pulse: 80   Resp: 17   Temp: 98.5 F (36.9 C) (!) 97.4 F (36.3 C)  SpO2: 95%      General: Awake, no distress.  CV:  Good peripheral perfusion.  Resp:  Normal effort.  Decreased breath sounds at bilateral bases Abd:  No distention.  Other:  2+ pitting edema to bilateral lower extremities   ED Results / Procedures / Treatments   Labs (all labs ordered are listed, but only abnormal results are displayed) Labs Reviewed  BASIC METABOLIC PANEL WITH GFR - Abnormal; Notable for the following  components:      Result Value   Glucose, Bld 174 (*)    BUN 40 (*)    Creatinine, Ser 2.14 (*)    Calcium  8.8 (*)    GFR, Estimated 30 (*)    All other components within normal limits  CBC - Abnormal; Notable for the following components:   WBC 17.3 (*)    Hemoglobin 12.2 (*)    MCHC 29.7 (*)    RDW 26.0 (*)    Platelets 551 (*)    All other components within normal limits  PROTIME-INR - Abnormal; Notable for the following components:   Prothrombin Time 16.6 (*)    INR 1.3 (*)    All other components within normal limits  PRO BRAIN NATRIURETIC PEPTIDE  TROPONIN T, HIGH SENSITIVITY     EKG  ED ECG REPORT I, Reche CHRISTELLA Leventhal, the attending physician, personally viewed and interpreted this ECG.  Date: 04/18/2024 Rate: 81 bpm Rhythm: Sinus rhythm with first-degree AV block right QRS Axis: Axis  deviation Intervals: Prolonged PR interval ST/T Wave abnormalities: normal Narrative Interpretation: no evidence of acute ischemia    RADIOLOGY Patient's chest x-ray was independently reviewed and interpreted by myself as cardiomegaly    PROCEDURES:  Critical Care performed: No  Procedures   MEDICATIONS ORDERED IN ED: Medications - No data to display   IMPRESSION / MDM / ASSESSMENT AND PLAN / ED COURSE  I reviewed the triage vital signs and the nursing notes.                              Differential includes, but is not limited to, viral syndrome, bronchitis including COPD exacerbation, pneumonia, reactive airway disease including asthma, CHF including exacerbation with or without pulmonary/interstitial edema, pneumothorax, ACS, thoracic trauma, and pulmonary embolism.   Patient's presentation is most consistent with severe exacerbation of chronic illness.  Patient is an 84 y.o. male with a past medical history of DVT currently on Eliquis , CHF, atrial fibrillation on anticoagulation, polycythemia vera, hypertension, cystostomy in place, presenting to the emergency  department complaining of shortness of breath and bilateral leg swelling that has been ongoing for the last 2 months but has worsened over the last 2 days.   On workup here in the emergency department the patient was noted to have a significantly elevated BNP at over 25,000. 40 mg of IV Lasix  ordered.  Patient was also noted to have a white blood cell count of 17.3.  I reviewed patient's previous lab work and he appears to have had a leukocytosis for close to a year now.  Chest x-ray did not show any evidence of pneumonia.  I discussed results with the patient and discussed plan for admission for further workup and treatment and he is agreeable.  I discussed the patient's case with the hospitalist who is in agreement with plan for admission.      FINAL CLINICAL IMPRESSION(S) / ED DIAGNOSES   Final diagnoses:  Acute on chronic congestive heart failure, unspecified heart failure type (HCC)  Leukocytosis, unspecified type     Rx / DC Orders   ED Discharge Orders     None        Note:  This document was prepared using Dragon voice recognition software and may include unintentional dictation errors.   [place] Reche HERO, MD 04/18/24 1421  "

## 2024-04-18 NOTE — ED Notes (Signed)
 Patient arrive to room 35 via bed. Empty ileostomy with 500cc of pink tinged liquid. Patient hard of hering but will answer questions appropriately. A/) X4.

## 2024-04-18 NOTE — ED Notes (Signed)
 Patient empty his ileo with 200cc of light pink urine. Family into patient.

## 2024-04-18 NOTE — ED Notes (Signed)
 RN called lab to add on troponin and BNP.

## 2024-04-18 NOTE — Telephone Encounter (Signed)
 Patient/caller partially completed triage.  Reason for refusal: patient hung up on RN after advising ED and agreeing before care advice could be given.     Copied from CRM [id number]. Topic: Clinical - Red Word Triage >> Apr 18, 2024  7:42 AM Emylou G wrote: [facility] that prompted transfer to Nurse Triage: shortness of breath Reason for Disposition  [1] MODERATE difficulty breathing (e.g., speaks in phrases, SOB even at rest, pulse 100-120) AND [2] NEW-onset or WORSE than normal  Answer Assessment - Initial Assessment Questions Patient states SOB since my heart problems started and worsening since about 0400 this AM couldn't lie flat and some SOB sitting up at rest. He would like to know if Dr Gasper recommends the ED. RN asked to clarify  onset of SOB when he refers to his heart problems. He states 1-2 months and that he doesn't really know. He became upset and short with RN stating he doesn't really feel like talking. Patient speaking in full sentences, no gasping, labored breathing or wheezing noted. Patient states Dr Gasper would know his history and he doesn't feel like he needs to talk to the triage RN. Advised patient if he feels too SOB to speak on the phone to go to ED. He agreed and hung up on RN.  Protocols used: Breathing Difficulty-A-AH

## 2024-04-19 ENCOUNTER — Other Ambulatory Visit (HOSPITAL_COMMUNITY): Payer: Self-pay

## 2024-04-19 ENCOUNTER — Telehealth (HOSPITAL_COMMUNITY): Payer: Self-pay

## 2024-04-19 DIAGNOSIS — N12 Tubulo-interstitial nephritis, not specified as acute or chronic: Secondary | ICD-10-CM | POA: Diagnosis not present

## 2024-04-19 DIAGNOSIS — I480 Paroxysmal atrial fibrillation: Secondary | ICD-10-CM

## 2024-04-19 DIAGNOSIS — I10 Essential (primary) hypertension: Secondary | ICD-10-CM | POA: Diagnosis not present

## 2024-04-19 DIAGNOSIS — Z515 Encounter for palliative care: Secondary | ICD-10-CM | POA: Diagnosis not present

## 2024-04-19 DIAGNOSIS — Z8603 Personal history of neoplasm of uncertain behavior: Secondary | ICD-10-CM | POA: Diagnosis not present

## 2024-04-19 DIAGNOSIS — I509 Heart failure, unspecified: Secondary | ICD-10-CM | POA: Insufficient documentation

## 2024-04-19 DIAGNOSIS — R310 Gross hematuria: Secondary | ICD-10-CM | POA: Diagnosis not present

## 2024-04-19 DIAGNOSIS — D72829 Elevated white blood cell count, unspecified: Secondary | ICD-10-CM | POA: Diagnosis not present

## 2024-04-19 DIAGNOSIS — N1832 Chronic kidney disease, stage 3b: Secondary | ICD-10-CM

## 2024-04-19 DIAGNOSIS — Z7189 Other specified counseling: Secondary | ICD-10-CM

## 2024-04-19 DIAGNOSIS — N183 Chronic kidney disease, stage 3 unspecified: Secondary | ICD-10-CM

## 2024-04-19 DIAGNOSIS — Z8551 Personal history of malignant neoplasm of bladder: Secondary | ICD-10-CM | POA: Diagnosis not present

## 2024-04-19 DIAGNOSIS — I5023 Acute on chronic systolic (congestive) heart failure: Secondary | ICD-10-CM

## 2024-04-19 DIAGNOSIS — I350 Nonrheumatic aortic (valve) stenosis: Secondary | ICD-10-CM

## 2024-04-19 DIAGNOSIS — J9620 Acute and chronic respiratory failure, unspecified whether with hypoxia or hypercapnia: Secondary | ICD-10-CM

## 2024-04-19 DIAGNOSIS — I25118 Atherosclerotic heart disease of native coronary artery with other forms of angina pectoris: Secondary | ICD-10-CM

## 2024-04-19 DIAGNOSIS — I5043 Acute on chronic combined systolic (congestive) and diastolic (congestive) heart failure: Secondary | ICD-10-CM | POA: Diagnosis not present

## 2024-04-19 LAB — BASIC METABOLIC PANEL WITH GFR
Anion gap: 9 (ref 5–15)
BUN: 31 mg/dL — ABNORMAL HIGH (ref 8–23)
CO2: 22 mmol/L (ref 22–32)
Calcium: 6.9 mg/dL — ABNORMAL LOW (ref 8.9–10.3)
Chloride: 112 mmol/L — ABNORMAL HIGH (ref 98–111)
Creatinine, Ser: 1.73 mg/dL — ABNORMAL HIGH (ref 0.61–1.24)
GFR, Estimated: 38 mL/min — ABNORMAL LOW
Glucose, Bld: 79 mg/dL (ref 70–99)
Potassium: 3.8 mmol/L (ref 3.5–5.1)
Sodium: 143 mmol/L (ref 135–145)

## 2024-04-19 LAB — CBC
HCT: 39.4 % (ref 39.0–52.0)
Hemoglobin: 11.6 g/dL — ABNORMAL LOW (ref 13.0–17.0)
MCH: 26.2 pg (ref 26.0–34.0)
MCHC: 29.4 g/dL — ABNORMAL LOW (ref 30.0–36.0)
MCV: 89.1 fL (ref 80.0–100.0)
Platelets: 504 K/uL — ABNORMAL HIGH (ref 150–400)
RBC: 4.42 MIL/uL (ref 4.22–5.81)
RDW: 25.7 % — ABNORMAL HIGH (ref 11.5–15.5)
WBC: 15.2 K/uL — ABNORMAL HIGH (ref 4.0–10.5)
nRBC: 0 % (ref 0.0–0.2)

## 2024-04-19 MED ORDER — FUROSEMIDE 10 MG/ML IJ SOLN
40.0000 mg | Freq: Once | INTRAMUSCULAR | Status: AC
  Administered 2024-04-19: 40 mg via INTRAVENOUS
  Filled 2024-04-19: qty 4

## 2024-04-19 NOTE — Assessment & Plan Note (Signed)
 Blood pressure currently within goal. - Giving some IV Lasix  -Cardiology is planning to start low-dose carvedilol 

## 2024-04-19 NOTE — Progress Notes (Signed)
 " [facility]  ROUNDING NOTE   Subjective:   Jerry Mcpherson is a 84 year old male with past medical conditions including atrial fibrillation on amiodarone , ischemic cardiomyopathy, systolic heart failure with EF 30 to 35% with diastolic dysfunction, hypertension, bladder cancer status post cystectomy with chronic urostomy tube, and chronic kidney disease stage IV.  Patient presents to the emergency department with shortness of breath and hematuria and has been admitted for Pinched nerve [G58.9] Acute on chronic combined systolic (congestive) and diastolic (congestive) heart failure (HCC) [I50.43] Radiculopathy, unspecified spinal region [M54.10] Leukocytosis, unspecified type [D72.829] Hypothyroidism, unspecified type [E03.9] Acute on chronic congestive heart failure, unspecified heart failure type [facility]) [I50.9]  Patient is known our practice from previous admission.  Patient states he has been having progressive shortness of breath and lower extremity edema for roughly 2 months.  States he has been having the hematuria for longer than that however has recently gotten worse.  Patient states he underwent an MRI a couple days ago to evaluate hematuria and has not received results.  Labs on ED arrival concerning for BUN 40, creatinine 2.14 with GFR 30, BNP greater than 25,000, white count 17.3.  UA appears hazy, straw-colored with hematuria and nitrites.  Chest x-ray negative.  We have been consulted to evaluate acute kidney injury.    Objective:  Vital signs in last 24 hours:  Temp:  [97.2 F (36.2 C)-98 F (36.7 C)] 97.2 F (36.2 C) (01/08 1230) Pulse Rate:  [69-85] 69 (01/08 1300) Resp:  [10-24] 16 (01/08 1300) BP: (111-155)/(65-93) 135/85 (01/08 1300) SpO2:  [94 %-100 %] 97 % (01/08 1230)  Weight change:  There were no vitals filed for this visit.  Intake/Output: I/O last 3 completed shifts: In: -  Out: 3000 [Urine:3000]   Intake/Output this shift:  No  intake/output data recorded.  Physical Exam: General: NAD  Head: Normocephalic, atraumatic. Moist oral mucosal membranes  Eyes: Anicteric  Lungs:  Clear to auscultation  Heart: Regular rate and rhythm  Abdomen:  Soft, nontender  Extremities:  Trace peripheral edema.  Neurologic: Awake, alert, conversant  Skin: Warm,dry, no rash       Basic Metabolic Panel: Recent Labs  Lab 04/18/24 1141 04/19/24 0544  NA 141 143  K 4.5 3.8  CL 107 112*  CO2 23 22  GLUCOSE 174* 79  BUN 40* 31*  CREATININE 2.14* 1.73*  CALCIUM  8.8* 6.9*    Liver Function Tests: No results for input(s): AST, ALT, ALKPHOS, BILITOT, PROT, ALBUMIN in the last 168 hours. No results for input(s): LIPASE, AMYLASE in the last 168 hours. No results for input(s): AMMONIA in the last 168 hours.  CBC: Recent Labs  Lab 04/18/24 1141 04/19/24 0544  WBC 17.3* 15.2*  HGB 12.2* 11.6*  HCT 41.1 39.4  MCV 89.3 89.1  PLT 551* 504*    Cardiac Enzymes: No results for input(s): CKTOTAL, CKMB, CKMBINDEX, TROPONINI in the last 168 hours.  BNP: Invalid input(s): POCBNP  CBG: No results for input(s): GLUCAP in the last 168 hours.  Microbiology: Results for orders placed or performed during the hospital encounter of 04/07/24  Urine Culture     Status: Abnormal   Collection Time: 04/07/24  5:18 PM   Specimen: Urine, Clean Catch  Result Value Ref Range Status   Specimen Description   Final    URINE, CLEAN CATCH Performed at [facility], [address], [place], [place] [address]    Special Requests   Final    NONE Performed  at [facility], [address] [place], [place] [address]    Culture (A)  Final    >=100,000 COLONIES/mL ENTEROBACTER CLOACAE 50,000 COLONIES/mL PSEUDOMONAS AERUGINOSA    Report Status 04/10/2024 FINAL  Final   Organism ID, Bacteria ENTEROBACTER CLOACAE (A)  Final   Organism ID, Bacteria PSEUDOMONAS AERUGINOSA (A)  Final       Susceptibility   Enterobacter cloacae - MIC*    CEFEPIME  <=0.12 SENSITIVE Sensitive     ERTAPENEM <=0.12 SENSITIVE Sensitive     CIPROFLOXACIN  0.5 INTERMEDIATE Intermediate     GENTAMICIN <=1 SENSITIVE Sensitive     NITROFURANTOIN 64 INTERMEDIATE Intermediate     TRIMETH /SULFA  <=20 SENSITIVE Sensitive     PIP/TAZO Value in next row Sensitive      <=4 SENSITIVEThis is a modified FDA-approved test that has been validated and its performance characteristics determined by the reporting laboratory.  This laboratory is certified under the Clinical Laboratory Improvement Amendments CLIA as qualified to perform high complexity clinical laboratory testing.    MEROPENEM Value in next row Sensitive      <=4 SENSITIVEThis is a modified FDA-approved test that has been validated and its performance characteristics determined by the reporting laboratory.  This laboratory is certified under the Clinical Laboratory Improvement Amendments CLIA as qualified to perform high complexity clinical laboratory testing.    * >=100,000 COLONIES/mL ENTEROBACTER CLOACAE   Pseudomonas aeruginosa - MIC*    MEROPENEM Value in next row Sensitive      <=4 SENSITIVEThis is a modified FDA-approved test that has been validated and its performance characteristics determined by the reporting laboratory.  This laboratory is certified under the Clinical Laboratory Improvement Amendments CLIA as qualified to perform high complexity clinical laboratory testing.    CIPROFLOXACIN  Value in next row Sensitive      <=4 SENSITIVEThis is a modified FDA-approved test that has been validated and its performance characteristics determined by the reporting laboratory.  This laboratory is certified under the Clinical Laboratory Improvement Amendments CLIA as qualified to perform high complexity clinical laboratory testing.    PIP/TAZO Value in next row Sensitive      <=4 SENSITIVEThis is a modified FDA-approved test that has been validated and its  performance characteristics determined by the reporting laboratory.  This laboratory is certified under the Clinical Laboratory Improvement Amendments CLIA as qualified to perform high complexity clinical laboratory testing.    CEFEPIME  Value in next row Sensitive      <=4 SENSITIVEThis is a modified FDA-approved test that has been validated and its performance characteristics determined by the reporting laboratory.  This laboratory is certified under the Clinical Laboratory Improvement Amendments CLIA as qualified to perform high complexity clinical laboratory testing.    CEFTAZIDIME Value in next row Sensitive      <=4 SENSITIVEThis is a modified FDA-approved test that has been validated and its performance characteristics determined by the reporting laboratory.  This laboratory is certified under the Clinical Laboratory Improvement Amendments CLIA as qualified to perform high complexity clinical laboratory testing.    * 50,000 COLONIES/mL PSEUDOMONAS AERUGINOSA    Coagulation Studies: Recent Labs    04/18/24 1141  LABPROT 16.6*  INR 1.3*    Urinalysis: Recent Labs    04/18/24 1528  COLORURINE STRAW*  LABSPEC 1.005  PHURINE 7.0  GLUCOSEU NEGATIVE  HGBUR LARGE*  BILIRUBINUR NEGATIVE  KETONESUR NEGATIVE  PROTEINUR NEGATIVE  NITRITE POSITIVE*  LEUKOCYTESUR SMALL*      Imaging: DG Chest 2 View Result Date: 04/18/2024 CLINICAL  DATA:  Shortness of breath. EXAM: CHEST - 2 VIEW COMPARISON:  03/24/2024, 12/09/2023 and CT 04/07/2024 FINDINGS: Lungs are adequately inflated without focal airspace consolidation or effusion. Evidence of known hiatal hernia. Cardiomediastinal silhouette and remainder of the exam is unchanged. IMPRESSION: 1. No acute cardiopulmonary disease. 2. Known hiatal hernia. Electronically Signed   By: Toribio Agreste M.D.   On: 04/18/2024 12:53     Medications:    ceFEPime  (MAXIPIME ) IV Stopped (04/18/24 1732)    amiodarone   200 mg Oral Daily   apixaban   2.5 mg  Oral BID   aspirin  EC  81 mg Oral Daily   atorvastatin   80 mg Oral Daily   clopidogrel   75 mg Oral Daily   ferrous sulfate   325 mg Oral Daily   fluticasone  furoate-vilanterol  1 puff Inhalation Daily   hydroxyurea   500 mg Oral Daily   levothyroxine   75 mcg Oral Daily   multivitamin with minerals  1 tablet Oral Daily   polyethylene glycol powder  17 g Oral Daily   sodium bicarbonate   1,300 mg Oral BID   sodium chloride  flush  3 mL Intravenous Q12H   acetaminophen  **OR** acetaminophen , ondansetron  **OR** ondansetron  (ZOFRAN ) IV, oxyCODONE -acetaminophen  **AND** oxyCODONE   Assessment/ Plan:  [name] is a 84 y.o.  male with past medical conditions including atrial fibrillation on amiodarone , ischemic cardiomyopathy, systolic heart failure with EF 30 to 35% with diastolic dysfunction, hypertension, bladder cancer status post cystectomy with chronic urostomy tube, and chronic kidney disease stage IV.  Patient presents to the emergency department with shortness of breath and hematuria and has been admitted for Pinched nerve [G58.9] Acute on chronic combined systolic (congestive) and diastolic (congestive) heart failure (HCC) [I50.43] Radiculopathy, unspecified spinal region [M54.10] Leukocytosis, unspecified type [D72.829] Hypothyroidism, unspecified type [E03.9] Acute on chronic congestive heart failure, unspecified heart failure type (HCC) [I50.9]   Acute Kidney Injury on chronic kidney disease stage IV with baseline creatinine 2.3 and GFR of 27 on 03/27/24.  Acute kidney injury likely secondary to focal pyelonephritis seen on abdominal MRI.  Visual confirmation of hematuria along with UA.  Currently prescribed cefepime  by primary team.  No acute indication for dialysis as creatinine has shown improvement today.  Will continue to monitor.   Lab Results  Component Value Date   CREATININE 1.73 (H) 04/19/2024   CREATININE 2.14 (H) 04/18/2024   CREATININE 2.12 (H) 04/07/2024     Intake/Output Summary (Last 24 hours) at 04/19/2024 1452 Last data filed at 04/19/2024 0300 Gross per 24 hour  Intake --  Output 2700 ml  Net -2700 ml   2.  Gross hematuria, right focal pyelonephritis seen on abdominal MRI.  Has history of bladder cancer status post cystectomy with urostomy.  Recent UTI diagnosis of Pseudomonas.  Currently being treated with cefepime .  3. Anemia of chronic kidney disease with acute blood loss Lab Results  Component Value Date   HGB 11.6 (L) 04/19/2024    Hemoglobin within optimal range.  Hematuria noted.  4. Secondary Hyperparathyroidism: with outpatient labs: None available  Lab Results  Component Value Date   CALCIUM  6.9 (L) 04/19/2024   CAION 1.15 03/23/2024   PHOS 4.0 01/28/2016    Will continue to monitor these bone minerals.  5.  Hypertension with chronic kidney disease.  Home regimen includes amlodipine  and metoprolol .  Both currently held.   LOS: 1 Naveed Humphres 1/8/20262:52 PM   "

## 2024-04-19 NOTE — Assessment & Plan Note (Signed)
 Patient with history of aortic stenosis with mean gradient 20 mmHg with a valve area of 1.39 cm with moderate AAS on cardiac catheterization this past December and was deemed not a candidate for intervention. - Follow-up with cardiology

## 2024-04-19 NOTE — Evaluation (Signed)
 Occupational Therapy Evaluation Patient Details Name: Jerry Mcpherson MRN: [id number] DOB: 20-Aug-1940 Today's Date: 04/19/2024   History of Present Illness   Jerry Mcpherson is a 84 y.o. male with medical history significant of atrial fibrillation on amiodarone , moderate aortic stenosis, ischemic cardiomyopathy with an EF of 30 to 35% with associated diastolic dysfunction, hypertension, chronic leukocytosis secondary to polycythemia vera, hypothyroidism, prior bladder cancer status post cystectomy with a chronic urostomy tube.  Has a history of chronic kidney disease and worsening renal function after diuresis during previous admission in December 2025.  Patient presented to the ED today with 2 complaints.  Primarily issues related to shortness of breath and orthopnea as well as lower extremity edema.  He also was complaining of significant hematuria and had undergone an MRI ordered by his primary care physician on 1/6 with results pending.     Clinical Impressions Patient presenting with decreased Ind in self care,balance, functional mobility,transfers, endurance, and safety awareness. Patient reports being Mod I at baseline with use of AD for mobility.  Patient currently functioning at supervision for lateral scoots along EOB, bed mobility, and UB dressing. Pt refused OOB tasks and ambulation this session. He reports,  I am going home . I will be fine at home.  Patient will benefit from acute OT to increase overall independence in the areas of ADLs, functional mobility, and safety awareness in order to safely discharge.     If plan is discharge home, recommend the following:   A little help with walking and/or transfers;A little help with bathing/dressing/bathroom;Assist for transportation     Functional Status Assessment   Patient has had a recent decline in their functional status and demonstrates the ability to make significant improvements in function in a reasonable and  predictable amount of time.     Equipment Recommendations   None recommended by OT      Precautions/Restrictions   Precautions Precautions: Fall     Mobility Bed Mobility Overal bed mobility: Modified Independent             General bed mobility comments: increased time    Transfers Overall transfer level: Needs assistance Equipment used: None Transfers: Bed to chair/wheelchair/BSC            Lateral/Scoot Transfers: Supervision        Balance Overall balance assessment: Needs assistance Sitting-balance support: Feet supported Sitting balance-Leahy Scale: Good                                     ADL either performed or assessed with clinical judgement   ADL Overall ADL's : Needs assistance/impaired                 Upper Body Dressing : Supervision/safety;Sitting                           Vision Patient Visual Report: No change from baseline              Pertinent Vitals/Pain Pain Assessment Pain Assessment: No/denies pain     Extremity/Trunk Assessment Upper Extremity Assessment Upper Extremity Assessment: Generalized weakness   Lower Extremity Assessment Lower Extremity Assessment: Generalized weakness       Communication Communication Communication: No apparent difficulties   Cognition Arousal: Alert Behavior During Therapy: WFL for tasks assessed/performed Cognition: No apparent impairments  Following commands: Intact       Cueing  General Comments   Cueing Techniques: Verbal cues              Home Living Family/patient expects to be discharged to:: Private residence Living Arrangements: Alone Available Help at Discharge: Family;Friend(s);Available PRN/intermittently Type of Home: House Home Access: Stairs to enter     Home Layout: One level     Bathroom Shower/Tub: Tub/shower unit;Sponge bathes at baseline         Home  Equipment: [employer] (2 wheels)          Prior Functioning/Environment Prior Level of Function : Independent/Modified Independent             Mobility Comments: reports using no AD ADLs Comments: independent with ADLs per patient    OT Problem List: Decreased strength;Decreased range of motion;Decreased activity tolerance;Impaired balance (sitting and/or standing);Decreased safety awareness   OT Treatment/Interventions: Self-care/ADL training;Balance training;Therapeutic exercise;Therapeutic activities;Energy conservation;Patient/family education      OT Goals(Current goals can be found in the care plan section)   Acute Rehab OT Goals Patient Stated Goal: to go home OT Goal Formulation: With patient Time For Goal Achievement: 05/03/24 Potential to Achieve Goals: Fair ADL Goals Pt Will Perform Grooming: with modified independence;standing Pt Will Perform Lower Body Dressing: with modified independence;sit to/from stand Pt Will Transfer to Toilet: with modified independence Pt Will Perform Toileting - Clothing Manipulation and hygiene: with modified independence   OT Frequency:  Min 2X/week       AM-PAC OT 6 Clicks Daily Activity     Outcome Measure Help from another person eating meals?: None Help from another person taking care of personal grooming?: None Help from another person toileting, which includes using toliet, bedpan, or urinal?: A Little Help from another person bathing (including washing, rinsing, drying)?: A Little Help from another person to put on and taking off regular upper body clothing?: None Help from another person to put on and taking off regular lower body clothing?: A Little 6 Click Score: 21   End of Session Nurse Communication: Mobility status  Activity Tolerance: Patient limited by fatigue Patient left: in bed;with call bell/phone within reach;with bed alarm set  OT Visit Diagnosis: Unsteadiness on feet (R26.81);Repeated falls  (R29.6);Muscle weakness (generalized) (M62.81)                Time: 8792-8776 OT Time Calculation (min): 16 min Charges:  OT General Charges $OT Visit: 1 Visit OT Evaluation $OT Eval Low Complexity: 1 Low  Izetta Claude, MS, OTR/L , CBIS ascom [phone]  04/19/2024, 2:32 PM

## 2024-04-19 NOTE — Consult Note (Signed)
 "                                                                                   Consultation Note Date: 04/19/2024 at 0920  Patient Name: Jerry Mcpherson  DOB: Nov 10, 1940  MRN: [id number]  Age / Sex: 84 y.o., male  PCP: [name], MD Referring Physician: Caleen Qualia, MD Chief Complaint: shortness of breath - nocturnal orthopnea  HPI/Patient Profile: 84 y.o. male  with past medical history of chronic HFrEF and ischemic cardiomyopathy with diastolic dysfunction (LVEF 30-35%), chronic leukocytosis secondary to polycythemia vera, DVT, PAD of left lower extremity (lower extremity angiography 03/26/2024), colon cancer s/p cystectomy with chronic urostomy, PAF (amiodarone ), moderate aortic stenosis, hypothyroidism, CKD, and HTN admitted on 04/18/2024 with shortness of breath/nocturnal orthopnea.   Patient is being treated for acute on chronic combined ischemic and stage heart failure with moderate aortic stenosis, CAD not amenable to intervention, hematuria with MRI suggestive of focal pyelonephritis on the right, and PAD with history of critical limb ischemia on the left and nonhealing ulcer of the left foot.  Patient is familiar to PMT service as I met with patient during his December 2025 admission.  Clinical Assessment and Goals of Care: Extensive chart review completed prior to meeting patient including: -Labs: Creatinine mildly improved to 1.73 from 2.14, hemoglobin with mild drop to 11.6 from 12.2, urine culture in process -Vital signs: Within normal limits-patient on room air satting 97% -Imaging: Radiographs of the chest reveals no acute abnormalities with a known hiatal hernia, MRI of abdomen on 1/6 suggestive of focal pyelonephritis with specifics as follows: -area in the right kidney interpolar region, posteriorly, favored to represent focal pyelonephritis, 1.5 x 2.4 cm mixed soft tissue andmacroscopic fat attenuation area adjacent to the upper pole of the left kidney, medially,  which corresponds to the observation described on the recent CT scan renal stone protocol (may represent sequela of fat necrosis or exophytic angiomyolipoma), multilobulated, 4.5 x 5.8 cm, cystic lesion centered in the left hepatic lobe, which exhibit fluid-fluid level, which may represent a cyst with proteinaceous/hemorrhagic contents. No mural nodularity or abnormal enhancement on the postcontrast images, multiple T2 hyperintense lesions throughout the pancreas with largest in the pancreatic tail measuring up to 1.1 x 1.9 cm (favored to represent pancreatic side-branch IPMN), and 11 x 12 mm right adrenal adenoma.  I then met with patient at bedside in ED to discuss diagnosis prognosis, GOC, EOL wishes, disposition and options.  I re-introduced Palliative Medicine as specialized medical care for people living with serious illness. It focuses on providing relief from the symptoms and stress of a serious illness. The goal is to improve quality of life for both the patient and the family.  Patient recalls meeting with me and the palliative medicine team during his previous hospitalization.  Discussed patient's functional and nutritional status since previous hospitalization/PTA.  He endorses he was having significant difficulty walking around his home.  He shares he is unable to lie flat without feeling short of breath.  He endorses the intensity of the shortness of breath increased significantly on Tuesday night.  He shares he almost called EMS at that time.  However, he was able to sit up and calm himself down.  We discussed patient's current illness and what it means in the larger context of patient's on-going co-morbidities.  I again highlighted the irreversible nature of patient's CAD and aortic stenosis not amenable to surgical intervention.  He shares he would like to know what can be done to fix the problem with his heart.  I again reiterated that patient's condition is chronic and irreversible.   He states, I guess I just have to live with it then.  I discussed treatments such as Lasix , monitoring of weights daily, heart healthy/low-salt diet, and other interventions can assist with symptom management of patient's cardiac issues.  However, he will continue to have fluid buildup, requiring diuresis, which also requires close management of kidneys to avoid nephrotoxic agents.  Reviewed the cyclical nature of patient's disease process with multiple hospitalizations without improvement.  Patient shares he now understand what is going on with me.  Using the teach back method, patient shares that he has a weak heart and is unable to pump the fluid like it needs to.  Therefore, the fluid is building up and making him feel like he cannot breathe.    I attempted to elicit values and goals important to the patient.  He shares he wants to get the fluid off so that he can go home to feel better.  I again highlighted that we are able to make some adjustments to medications at this time.  However, the overall natural disease trajectory of his coronary artery disease as well as cardiac issues will never be cured.  Additionally, discussed pyelonephritis, gross hematuria, and that cultures of his urine are pending.  He shares he wants to know what is going on with my bladder infection.  Reviewed that urology has been consulted and we will follow-up for urine culture results when available.  I highlighted that the bigger picture is that patient's advanced age, decreased functional status, frailty, coronary artery disease, potential for UTI, as well as ongoing chronic comorbidities such as A-fib, polycythemia vera, and heart failure, patient is showing signs of overall decline with a poor prognosis.  He says he understands his current medicine should help him feel better so that he can get home.  I am concerned that patient is not fully accepting of the reality of the severity of his current illnesses.  I  discussed boundaries of medical treatments with patient.  As per previous discussions, patient says that he will cross that bridge when I get to it.  I discussed that should patient not wish to discuss it at this time and the Default that he will remain at is a full code-accepting of all offered, available, and appropriate medical interventions to sustain his life.  He shares that he will deal with that when it comes.  I made it very clear that full code and full scope remain given patient's resistance to placing any boundaries on medical plan of care.  I discussed advance care planning and next of kin decision maker.  In the event that patient is unable to speak for himself, he has named his nephew Garrel as his runner, broadcasting/film/video.  During our discussion, attending joined our conversation.  I conveyed above discussion to attending at bedside.  Full code and full scope remain.  Workup for UTI and possible pyelonephritis in progress.  PMT will continue to follow and support.  No change to plan of care at this time.  Primary Decision Maker PATIENT  Physical Exam Vitals reviewed.  Constitutional:      General: He is not in acute distress.    Appearance: He is normal weight.  HENT:     Head: Normocephalic.  Cardiovascular:     Rate and Rhythm: Normal rate. Rhythm irregular.  Pulmonary:     Effort: Pulmonary effort is normal. No tachypnea.  Skin:    General: Skin is warm and dry.     Coloration: Skin is pale.  Neurological:     Mental Status: He is alert and oriented to person, place, and time.  Psychiatric:        Mood and Affect: Mood normal. Mood is not anxious.        Behavior: Behavior normal. Behavior is not agitated.     Palliative Assessment/Data: 40-50%     Thank you for this consult. Palliative medicine will continue to follow and assist holistically.   I personally spent a total of 75 minutes in the care of the patient today including preparing to see the  patient, getting/reviewing separately obtained history, counseling and educating, referring and communicating with other health care professionals, and documenting clinical information in the EHR.   Signed by: Lamarr Gunner, DNP, FNP-BC Palliative Medicine   Please contact Palliative Medicine Team providers via [facility] for questions and concerns.                "

## 2024-04-19 NOTE — Assessment & Plan Note (Signed)
 Currently in NSR -Continue home Eliquis  and amiodarone 

## 2024-04-19 NOTE — Assessment & Plan Note (Signed)
 MRI suggestive of focal pyelonephritis on the right ? History of bladder cancer status post cystectomy and urostomy.  Continue to have gross hematuria, slight decrease in hemoglobin to 11.6 today.  Urine cultures are pending, history of multiple UTIs since October with urine culture growing Klebsiella, Citrobacter, Enterobacter and Pseudomonas UTI all sensitive to cefepime    -Continue with cefepime  -Follow-up urine culture results - Urology was consulted for gross hematuria

## 2024-04-19 NOTE — Assessment & Plan Note (Addendum)
 Symptoms concerning for acute on chronic combined heart failure.  Significantly elevated proBNP.  Significant improvement in lower extremity edema with 1 dose of IV Lasix . Cardiology is on board. - Continue IV Lasix  today-likely will transition to p.o. low-dose Lasix  per cardiology from tomorrow -Cardiology is planning to add low-dose carvedilol  from tomorrow -Daily weight and BMP -Strict intake and output

## 2024-04-19 NOTE — Hospital Course (Addendum)
 Jerry Mcpherson is a 84 y.o. male with medical history significant of atrial fibrillation on amiodarone , moderate aortic stenosis, ischemic cardiomyopathy with an EF of 30 to 35% with associated diastolic dysfunction, hypertension, chronic leukocytosis secondary to polycythemia vera, hypothyroidism, prior bladder cancer status post cystectomy with a chronic urostomy tube.  Has a history of chronic kidney disease and worsening renal function after diuresis during previous admission in December 2025.  Patient presented to the ED with complaint of worsening shortness of breath, orthopnea and lower extremity edema. Patient also recently had some hematuria and had MRI done by primary care provider.  On presentation vital stable, labs with leukocytosis at 17, 300 which is around his typical range.  Creatinine at 2.14 which is down from 2.9 on the day of prior to discharge.  proBNP elevated at 25,472 and mildly elevated troponin at 37. CXR negative for any acute cardiopulmonary disease.  Patient was admitted for acute on chronic combined systolic and diastolic heart failure.  Received IV Lasix .  1/8: Vital stable on room air, labs with improving renal function with creatinine at 1.73 today.  Improving leukocytosis at 15.2.  Urine cultures pending.  Giving another dose of IV Lasix . CBC with improving leukocytosis, hemoglobin with some decreased to 11.6, continue to have gross hematuria, recent MRI done by PCP with multiple significant abnormalities-please see the full report Urology was consulted.  Not on ARB or ACE inhibitor secondary to poor renal function as well as history of angioedema Metoprolol  and Norvasc  were held secondary to suboptimal blood pressure readings prior to discharge in December.  Cardiology is planning to add low-dose carvedilol .  Patient would like to continue full scope of medical care, palliative care is on board.  1/9: Hemodynamically stable with worsening of creatinine to  2.63, patient has an history of poor tolerance of diuretics in the past.  Holding further IV diuresis and likely will need low-dose p.o. Lasix  as needed on discharge per cardiology.  They would like to monitor for 1 more day. PT is recommending home health.  Urology is recommending follow-up with his oncologist at [facility]  Urine cultures with Enterobacter cloacae and Pseudomonas aeruginosa.  Continuing cefepime , susceptibility to Pseudomonas is not resulted yet.  1/10: Hemodynamically stable, some improvement in renal function with creatinine now at 2.5 after stopping IV diuresis.  Obtained final urine culture results and based on that he will be getting Bactrim  for 3 more days and his Cipro  for 10 days and need to have a close follow-up with his urologist at oncologist at [facility].  Patient was also given low-dose Lasix  to use as needed for lower extremity edema or worsening shortness of breath.  He will follow-up closely with cardiology as outpatient for further assistance.  Patient agreed to make himself DNR but would like to keep full scope of medical care.  He was advised multiple times by different providers for palliative/hospice care.  He is currently not ready to go on hospice.  He will continue on current medications and need to have a close follow-up with his providers for further assistance.  Patient will remain high risk for readmission and mortality based on his advanced age and multiple life limiting underlying comorbidities.

## 2024-04-19 NOTE — Consult Note (Signed)
 "    Urology Consult   I have been asked to see the patient by Dr. Caleen, for evaluation and management of UTI/pyelonephritis.  HPI:  Jerry Mcpherson is a 84 y.o. year old   Hx of MIBC  - followed by [facility]  - s/p 3 cycle NAC, RC/IC (2022)   - NED on surv. Scans thru Aug 2024  Presented to [facility] ED 04/18/24  - SOB + BLE, also having new intermittent GH  - PCP ordered MRI abd   - MRI abd (04/17/24) - hypoenhancing 2.2cm Right interpolar lesion supsicious for focal pyelonephritis. Mild bilateral hydroureteronephrosis. Decompressed ileal conduit  - UA Large blood, 21-50 WBC, >50 RBC  On my exam patient sitting upright and conversational, very pleasant Only complaint is 30-month history of intermittent gross hematuria-has improved over recent days Denies right flank pain, any new abdominal pain, pain around his urostomy Generally eating and drinking well, denies subjective fevers, denies nausea vomiting  Medical hx:  atrial fibrillation on amiodarone , moderate aortic stenosis, ischemic cardiomyopathy with an EF of 30 to 35% with associated diastolic dysfunction, hypertension, chronic leukocytosis secondary to polycythemia vera, hypothyroidism     PMH: Past Medical History:  Diagnosis Date   Acute deep vein thrombosis (DVT) of right femoral vein (HCC) 10/28/2020   US  10/24/2020 [facility] Acute right lower extremity DVT extending from the common femoral vein to  the popliteal vein.     Bladder cancer (HCC)    Genital herpes    Hyperglycemia    Hypertension    Squamous cell carcinoma     Surgical History: Past Surgical History:  Procedure Laterality Date   CATARACT EXTRACTION Left 10/2012   Dr. Lethea; [facility]   CYSTECTOMY W/ URETEROILEAL CONDUIT  07/24/2020   [facility] for Stage IV bladder cancer extending to prostate   EYE SURGERY Left    removed a piece of steel   FEMORAL ARTERY STENT Left 06/16/2022   SFA stenting at [facility]   HERNIA REPAIR  12/08/2006    umbilical hernia, incarderated; Dr. Dessa   LOWER EXTREMITY ANGIOGRAPHY Left 03/26/2024   Procedure: Lower Extremity Angiography;  Surgeon: Marea Selinda RAMAN, MD;  Location: [facility] INVASIVE CV LAB;  Service: Cardiovascular;  Laterality: Left;   PROSTATECTOMY  4/14/2-22   [facility] for Stage IV bladder cancer extending to prostate   RIGHT/LEFT HEART CATH AND CORONARY ANGIOGRAPHY Bilateral 03/23/2024   Procedure: RIGHT/LEFT HEART CATH AND CORONARY ANGIOGRAPHY;  Surgeon: Mady Bruckner, MD;  Location: [facility] INVASIVE CV LAB;  Service: Cardiovascular;  Laterality: Bilateral;   ROTATOR CUFF REPAIR     SQUAMOUS CELL CARCINOMA EXCISION  2008   TONSILLECTOMY      Home Medications:   Allergies: Allergies[1]  Family History: Family History  Problem Relation Age of Onset   Hypertension Mother    Heart attack Mother    Stroke Father    Cancer Father    COPD Sister    Congestive Heart Failure Brother     Social History:  reports that he quit smoking about 56 years ago. His smoking use included cigarettes. He started smoking about 71 years ago. He has a 45 pack-year smoking history. He has never used smokeless tobacco. He reports that he does not drink alcohol and does not use drugs.  ROS: Negative aside from those stated in the HPI.  Physical Exam: BP 124/72 (BP Location: Right Arm)   Pulse 75   Temp (!) 97.2 F (36.2 C) (Axillary)   Resp 15  SpO2 97%    Constitutional:  Alert and oriented, No acute distress. Cardiovascular: No clubbing, cyanosis, or edema. Respiratory: Normal respiratory effort, no increased work of breathing. GI: Abdomen is soft, nontender, nondistended, no abdominal masses GU: RLQ urostomy, pink and protuberant tissue healthy in appearance.  Light rose type urine in bag, no clots.  Abdomen is nontender around urostomy.  No right CVA tenderness Lymph: No cervical or inguinal lymphadenopathy. Skin: No rashes, bruises or suspicious lesions. Neurologic: Grossly intact, no  focal deficits, moving all 4 extremities. Psychiatric: Normal mood and affect.   Laboratory Data:  Latest Reference Range & Units 03/29/24 04:02 03/30/24 03:52 03/31/24 04:30 04/07/24 17:18 04/18/24 11:41 04/19/24 05:44  Creatinine 0.61 - 1.24 mg/dL 7.00 (H) 7.15 (H) 7.51 (H) 2.12 (H) 2.14 (H) 1.73 (H)    Latest Reference Range & Units 03/28/24 04:54 03/29/24 04:02 03/31/24 04:30 04/07/24 17:18 04/18/24 11:41 04/19/24 05:44  WBC 4.0 - 10.5 K/uL 22.0 (H) 18.2 (H) 15.0 (H) 19.7 (H) 17.3 (H) 15.2 (H)  (H): Data is abnormally high  Pertinent Imaging: I have personally reviewed the MRI abd (04/17/24) - hypoenhancing 2.2cm Right interpolar lesion supsicious for focal pyelonephritis. Mild bilateral hydroureteronephrosis. Decompressed ileal conduit.     Assessment & Plan:   Hx of muscle-invasive bladder Ca  - s/p prior Radical cystectomy/IC (2022)   Admitted with possible UTI / focal pyelonephritis Diagnosis challenging in the absence of overt symptoms, fever, pouchitis -he is generally at urologic baseline aside from 49-month history of gross hematuria MRI finding unclear without comparison films- although agree with possible focal pyelo vs hypoenhancing prior infarct / occult malignancy   - Recommend empiric UTI treatment, tailor to culture results - He does not require any further urgent Urologic workup at this time - He should follow up with [facility] for cancer care, interval imaging of Right renal lesion, possible continued workup for gross hematuria.  This should be performed through his oncologic surgeon  Penne JONELLE Skye, MD  [facility] [address], [address] [place], [place] [address] [phone]       [1]  Allergies Allergen Reactions   Losartan Potassium Swelling    angioedema Other reaction(s): Angioedema   Shellfish Allergy Swelling and Rash    Rash, itching, swelling   Currently eats SHELLFISH without symptoms   Shellfish Protein-Containing  Drug Products Dermatitis and Swelling    Rash, itching, swelling    Rash, itching, swelling   Currently eats SHELLFISH without symptoms   Doxycycline  Other (See Comments)    Blisters on hand   Ultram  [Tramadol Hcl] Nausea And Vomiting   Celecoxib Itching, Swelling, Rash and Other (See Comments)    Cannot take when eating shellfish   Codeine Palpitations    Heart racing   Cyclobenzaprine Other (See Comments)    Musculoskeletal therapy agents cause Excessive sedation Musculoskeletal therapy agents cause excessive sedation   Tramadol Hcl Nausea And Vomiting   "

## 2024-04-19 NOTE — Assessment & Plan Note (Signed)
 History of CKD stage IIIb. Some improvement in creatinine to 1.73 with IV Lasix  given yesterday. - Continuing IV Lasix  today -Monitor renal function -Avoid nephrotoxins

## 2024-04-19 NOTE — Discharge Instructions (Signed)
 [facility] will resume Home Health services. They have been notified of your discharge and will contact you within 48 hours after discharge.

## 2024-04-19 NOTE — Assessment & Plan Note (Signed)
 Patient with history of polycythemia vera with associated chronic leukocytosis. -CBC seems to be around baseline -Continue home hydroxyurea 

## 2024-04-19 NOTE — Progress Notes (Signed)
 " Progress Note   Patient: Jerry Mcpherson FMW:[id number] DOB: [birth year] DOA: 04/18/2024     1 DOS: the patient was seen and examined on 04/19/2024   Brief hospital course: Jerry Mcpherson is a 84 y.o. male with medical history significant of atrial fibrillation on amiodarone , moderate aortic stenosis, ischemic cardiomyopathy with an EF of 30 to 35% with associated diastolic dysfunction, hypertension, chronic leukocytosis secondary to polycythemia vera, hypothyroidism, prior bladder cancer status post cystectomy with a chronic urostomy tube.  Has a history of chronic kidney disease and worsening renal function after diuresis during previous admission in December 2025.  Patient presented to the ED with complaint of worsening shortness of breath, orthopnea and lower extremity edema. Patient also recently had some hematuria and had MRI done by primary care provider.  On presentation vital stable, labs with leukocytosis at 17, 300 which is around his typical range.  Creatinine at 2.14 which is down from 2.9 on the day of prior to discharge.  proBNP elevated at 25,472 and mildly elevated troponin at 37. CXR negative for any acute cardiopulmonary disease.  Patient was admitted for acute on chronic combined systolic and diastolic heart failure.  Received IV Lasix .  1/8: Vital stable on room air, labs with improving renal function with creatinine at 1.73 today.  Improving leukocytosis at 15.2.  Urine cultures pending.  Giving another dose of IV Lasix . CBC with improving leukocytosis, hemoglobin with some decreased to 11.6, continue to have gross hematuria, recent MRI done by PCP with multiple significant abnormalities-please see the full report Urology was consulted.  Not on ARB or ACE inhibitor secondary to poor renal function as well as history of angioedema Metoprolol  and Norvasc  were held secondary to suboptimal blood pressure readings prior to discharge in December.  Cardiology is planning to add  low-dose carvedilol .  Patient would like to continue full scope of medical care, palliative care is on board.  Assessment and Plan: * Acute on chronic combined systolic (congestive) and diastolic (congestive) heart failure (HCC) Symptoms concerning for acute on chronic combined heart failure.  Significantly elevated proBNP.  Significant improvement in lower extremity edema with 1 dose of IV Lasix . Cardiology is on board. - Continue IV Lasix  today-likely will transition to p.o. low-dose Lasix  per cardiology from tomorrow -Cardiology is planning to add low-dose carvedilol  from tomorrow -Daily weight and BMP -Strict intake and output  Nonrheumatic aortic valve stenosis Patient with history of aortic stenosis with mean gradient 20 mmHg with a valve area of 1.39 cm with moderate AAS on cardiac catheterization this past December and was deemed not a candidate for intervention. - Follow-up with cardiology  Coronary artery disease of native artery of native heart with stable angina pectoris Patient has known 70% left main lesion as well as mid to distal RCA lesions 35% He was evaluated by interventional cardiology and felt not to be a good candidate either for PCI or CABG. No current chest pain, barely positive troponin with a flat curve secondary to demand ischemia with CHF exacerbation.  Gross hematuria MRI suggestive of focal pyelonephritis on the right ? History of bladder cancer status post cystectomy and urostomy.  Continue to have gross hematuria, slight decrease in hemoglobin to 11.6 today.  Urine cultures are pending, history of multiple UTIs since October with urine culture growing Klebsiella, Citrobacter, Enterobacter and Pseudomonas UTI all sensitive to cefepime    -Continue with cefepime  -Follow-up urine culture results - Urology was consulted for gross hematuria  Paroxysmal atrial fibrillation (HCC) Currently  in NSR -Continue home Eliquis  and amiodarone   History of  polycythemia vera Patient with history of polycythemia vera with associated chronic leukocytosis. -CBC seems to be around baseline -Continue home hydroxyurea   Hypertension Blood pressure currently within goal. - Giving some IV Lasix  -Cardiology is planning to start low-dose carvedilol   Chronic kidney disease (CKD), stage III (moderate) (HCC) History of CKD stage IIIb. Some improvement in creatinine to 1.73 with IV Lasix  given yesterday. - Continuing IV Lasix  today -Monitor renal function -Avoid nephrotoxins  Hypothyroidism TSH was 9.61 during most recent prior hospitalization. -Continue home Synthroid       Subjective: Patient is feeling much improved when seen today.  Improving lower extremity edema.  Still having gross hematuria. He was talking with palliative care and would like to stay independent and full scope of care.  Physical Exam: Vitals:   04/19/24 0730 04/19/24 0747 04/19/24 1230 04/19/24 1300  BP: (!) 140/71 130/71 124/72 135/85  Pulse: 73 76 75 69  Resp: (!) 21 19 15 16   Temp:  97.9 F (36.6 C) (!) 97.2 F (36.2 C)   TempSrc:  Oral Axillary   SpO2:  94% 97%    General.  Frail elderly man, in no acute distress. Pulmonary.  Lungs clear bilaterally, normal respiratory effort. CV.  Regular rate and rhythm, no JVD, rub or murmur. Abdomen.  Soft, nontender, nondistended, BS positive. CNS.  Alert and oriented .  No focal neurologic deficit. Extremities.  Trace LE edema, no cyanosis, pulses intact and symmetrical. Psychiatry.  Judgment and insight appears normal.   Data Reviewed: Prior data reviewed  Family Communication: Tried calling his nephew with no response.  Disposition: Status is: Inpatient Remains inpatient appropriate because: Severity of illness  Planned Discharge Destination: Home  DVT prophylaxis.  Eliquis  Time spent: 50 minutes  This record has been created using Conservation officer, historic buildings. Errors have been sought and corrected,but  may not always be located. Such creation errors do not reflect on the standard of care.   Author: Amaryllis Dare, MD 04/19/2024 1:19 PM  For on call review [email or web address].  "

## 2024-04-19 NOTE — Evaluation (Signed)
 Physical Therapy Evaluation Patient Details Name: Jerry Mcpherson MRN: [id number] DOB: 1940/06/02 Today's Date: 04/19/2024  History of Present Illness  Jerry Mcpherson is a 84 y.o. male with medical history significant of atrial fibrillation on amiodarone , moderate aortic stenosis, ischemic cardiomyopathy with an EF of 30 to 35% with associated diastolic dysfunction, hypertension, chronic leukocytosis secondary to polycythemia vera, hypothyroidism, prior bladder cancer status post cystectomy with a chronic urostomy tube.  Has a history of chronic kidney disease and worsening renal function after diuresis during previous admission in December 2025.  Patient presented to the ED today with 2 complaints.  Primarily issues related to shortness of breath and orthopnea as well as lower extremity edema.  He also was complaining of significant hematuria and had undergone an MRI ordered by his primary care physician on 1/6 with results pending.  Clinical Impression  Patient noted to be in supine position at PT arrival in room, for an initial PT evaluation due to a decline in functional status, with baseline mobility reported as modI, and currently requiring minA for room ambulation. The patient is A&O x 4, presenting with good willingness to work with PT. The patient resides in a house and lives alone with family/friend support. There are no STE inside the residence.  Vitals are unstable with an SpO? of 87 to upper 90s% on RA. Gait was assessed with 1+ hand held assist. Gait mechanic observations noted moderate level of unsteadiness. The overall clinical impression is that the patient presents with  mobility limitations. Recommended skilled PT will address safety, mobility, and discharge planning.        If plan is discharge home, recommend the following: A little help with walking and/or transfers;A little help with bathing/dressing/bathroom;Help with stairs or ramp for entrance;Assist for transportation    Can travel by private vehicle        Equipment Recommendations Rolling walker (2 wheels)  Recommendations for Other Services       Functional Status Assessment Patient has had a recent decline in their functional status and demonstrates the ability to make significant improvements in function in a reasonable and predictable amount of time.     Precautions / Restrictions Restrictions Weight Bearing Restrictions Per Provider Order: No      Mobility  Bed Mobility Overal bed mobility: Modified Independent                  Transfers Overall transfer level: Needs assistance Equipment used: 1 person hand held assist Transfers: Sit to/from Stand Sit to Stand: Contact guard assist, Min assist           General transfer comment: 1+ support to trunk    Ambulation/Gait Ambulation/Gait assistance: Min assist Gait Distance (Feet): 25 Feet Assistive device: 1 person hand held assist Gait Pattern/deviations: Step-through pattern Gait velocity: decreased     General Gait Details: unsteadiness noted with no SOB denied walking further would benefit from RW use to improve steadiness; low spO2 noted on telemetry; RN notifed  Stairs            Wheelchair Mobility     Tilt Bed    Modified Rankin (Stroke Patients Only)       Balance Overall balance assessment: Needs assistance Sitting-balance support: Feet supported Sitting balance-Leahy Scale: Good     Standing balance support: During functional activity Standing balance-Leahy Scale: Poor  Pertinent Vitals/Pain Pain Assessment Pain Assessment: No/denies pain    Home Living Family/patient expects to be discharged to:: Private residence Living Arrangements: Alone Available Help at Discharge: Family;Friend(s);Available PRN/intermittently Type of Home: House Home Access: Stairs to enter       Home Layout: One level Home Equipment: Agricultural Consultant (2  wheels)      Prior Function Prior Level of Function : Independent/Modified Independent             Mobility Comments: reports using no AD ADLs Comments: independent with ADLs per patient     Extremity/Trunk Assessment   Upper Extremity Assessment Upper Extremity Assessment: Generalized weakness    Lower Extremity Assessment Lower Extremity Assessment: Generalized weakness;LLE deficits/detail    Cervical / Trunk Assessment Cervical / Trunk Assessment: Normal  Communication   Communication Communication: No apparent difficulties    Cognition Arousal: Alert Behavior During Therapy: WFL for tasks assessed/performed   PT - Cognitive impairments: No apparent impairments                         Following commands: Intact       Cueing Cueing Techniques: Verbal cues     General Comments      Exercises     Assessment/Plan    PT Assessment Patient needs continued PT services  PT Problem List Decreased strength;Decreased activity tolerance;Decreased mobility       PT Treatment Interventions Balance training;Gait training;Stair training;Functional mobility training;Therapeutic activities;Therapeutic exercise    PT Goals (Current goals can be found in the Care Plan section)  Acute Rehab PT Goals Patient Stated Goal: Pt wants to get stronger PT Goal Formulation: With patient Time For Goal Achievement: 05/03/24 Potential to Achieve Goals: Good    Frequency Min 2X/week     Co-evaluation               AM-PAC PT 6 Clicks Mobility  Outcome Measure Help needed turning from your back to your side while in a flat bed without using bedrails?: None Help needed moving from lying on your back to sitting on the side of a flat bed without using bedrails?: None Help needed moving to and from a bed to a chair (including a wheelchair)?: None Help needed standing up from a chair using your arms (e.g., wheelchair or bedside chair)?: None Help needed to  walk in hospital room?: A Little Help needed climbing 3-5 steps with a railing? : A Little 6 Click Score: 22    End of Session   Activity Tolerance: Patient tolerated treatment well Patient left: in bed;with call bell/phone within reach Nurse Communication: Mobility status PT Visit Diagnosis: Other abnormalities of gait and mobility (R26.89);Difficulty in walking, not elsewhere classified (R26.2)    Time: 9069-9049 PT Time Calculation (min) (ACUTE ONLY): 20 min   Charges:   PT Evaluation $PT Eval Low Complexity: 1 Low   PT General Charges $$ ACUTE PT VISIT: 1 Visit         Sherlean Lesches DPT, PT    Zayne Draheim A Dimitrius Steedman 04/19/2024, 10:04 AM

## 2024-04-19 NOTE — TOC Initial Note (Signed)
 Transition of Care [facility]) - Initial/Assessment Note    Patient Details  Name: Jerry Mcpherson MRN: [id number] Date of Birth: 20-Aug-1940  [facility]) CM/SW Contact:    Doctor Sheahan L Izick Gasbarro, LCSW Phone Number: 04/19/2024, 1:57 PM  Clinical Narrative:                  Chart reviewed. Bamboo patient ping states that patient has HHA services with [facility]. CSW sent secure chat to provider requesting resumption orders. Orders entered. [facility] notified in EPIC portal.   Awaiting to be advised on whether patient will discharge with O2.        Patient Goals and CMS Choice            Expected Discharge Plan and Services                                              Prior Living Arrangements/Services                       Activities of Daily Living      Permission Sought/Granted                  Emotional Assessment              Admission diagnosis:  Acute on chronic combined systolic (congestive) and diastolic (congestive) heart failure (HCC) [I50.43] Patient Active Problem List   Diagnosis Date Noted   Acute on chronic congestive heart failure (HCC) 04/19/2024   Pyelonephritis 04/19/2024   Chronic kidney disease (CKD), stage III (moderate) (HCC) 04/19/2024   History of polycythemia vera 04/19/2024   Paroxysmal atrial fibrillation (HCC) 04/19/2024   Acute on chronic combined systolic (congestive) and diastolic (congestive) heart failure (HCC) 04/18/2024   History of primary bladder cancer 04/09/2024   Cystostomy in place [facility]) 04/09/2024   Hypertensive heart and chronic kidney disease with heart failure and with stage 5 chronic kidney disease, or end stage renal disease (HCC) 03/29/2024   Ischemic cardiomyopathy 03/28/2024   Ischemic leg 03/28/2024   Coronary artery disease of native artery of native heart with stable angina pectoris 03/28/2024   Atrial fibrillation with RVR (HCC) 03/24/2024   AKI (acute kidney injury)  03/24/2024   Pressure injury of skin 03/24/2024   Chronic HFrEF (heart failure with reduced ejection fraction) (HCC) 02/06/2024   UTI (urinary tract infection) 12/16/2023   Nonrheumatic aortic valve stenosis 04/01/2023   PAD (peripheral artery disease) 07/09/2022   Pilonidal cyst 08/18/2021   Atrial flutter (HCC) 07/09/2021   Homeless 02/18/2021   Hypothyroidism 01/25/2021   Acute deep vein thrombosis (DVT) of right femoral vein (HCC) 10/28/2020   Malignant neoplasm of overlapping sites of bladder (HCC) 07/24/2020   Bladder cancer (HCC) 01/30/2020   Gross hematuria 01/17/2020   Leukocytosis 05/15/2018   Polycythemia vera (HCC) 05/15/2018   Erectile dysfunction 12/10/2014   Hypertension 12/10/2014   Arthralgia of left lower leg 12/10/2014   Radiculopathy 12/10/2014   Squamous cell carcinoma 12/10/2014   Vertigo 12/10/2014   PCP:  [name], MD Pharmacy:   [facility] [address] [place] - [address] [place] [address] Phone: [phone] Fax: [phone]  [facility] - [place], [place] - [address] [place] [address] Phone: [phone] Fax: [phone]  Social Drivers of Health (SDOH) Social History: SDOH Screenings   Food Insecurity: Food Insecurity Present (04/02/2024)  Housing: Unknown (04/02/2024)  Transportation Needs: Unmet Transportation Needs (04/02/2024)  Utilities: Not At Risk (04/02/2024)  Alcohol Screen: Low Risk (08/10/2023)  Depression (PHQ2-9): Low Risk (01/17/2024)  Financial Resource Strain: Low Risk  (09/16/2023)   Received from [facility]  Recent Concern: Financial Resource Strain - Medium Risk (08/10/2023)  Physical Activity: Inactive (08/10/2023)  Social Connections: Socially Isolated (03/23/2024)  Stress: No Stress Concern Present (08/10/2023)  Tobacco Use: Medium Risk (04/18/2024)  Health Literacy: Adequate Health Literacy (08/10/2023)   SDOH Interventions:     Readmission  Risk Interventions    03/29/2024   12:33 PM  Readmission Risk Prevention Plan  Transportation Screening Complete  PCP or Specialist Appt within 3-5 Days Complete  HRI or Home Care Consult Complete  Social Work Consult for Recovery Care Planning/Counseling Complete  Palliative Care Screening Not Applicable  Medication Review Oceanographer) Complete

## 2024-04-19 NOTE — Assessment & Plan Note (Signed)
 Patient has known 70% left main lesion as well as mid to distal RCA lesions 35% He was evaluated by interventional cardiology and felt not to be a good candidate either for PCI or CABG. No current chest pain, barely positive troponin with a flat curve secondary to demand ischemia with CHF exacerbation.

## 2024-04-19 NOTE — Telephone Encounter (Signed)
 Pharmacy Patient Advocate Encounter  Insurance verification completed.    The patient is insured through [employer]. Patient has Medicare and is not eligible for a copay card, but may be able to apply for patient assistance or Medicare RX Payment Plan (Patient Must reach out to their plan, if eligible for payment plan), if available.    Ran test claim for Eliquis  5mg  tablet and the current 30 day co-pay is $12.65.   This test claim was processed through [facility]- copay amounts may vary at other pharmacies due to pharmacy/plan contracts, or as the patient moves through the different stages of their insurance plan.

## 2024-04-19 NOTE — Assessment & Plan Note (Signed)
 TSH was 9.61 during most recent prior hospitalization. -Continue home Synthroid 

## 2024-04-19 NOTE — Consult Note (Signed)
 "  Cardiology Consultation   Patient ID: Mekhi Sonn MRN: [id number]; DOB: [birth year]  Admit date: 04/18/2024 Date of Consult: 04/19/2024  PCP:  [name], MD   [facility] Providers Cardiologist:  None        Patient Profile: Vernie Piet is a 84 y.o. 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 Caleen.  History of Present Illness:   Mr. Caraway was previously followed by [facility] cardiology and [facility] cardiology, establishing care with Dr. Darliss in 02/2024.  Stress echo in 2022 without evidence of cardiomyopathy or wall motion abnormality with mild aortic stenosis and trivial aortic and mitral regurgitation.  Echo in 10/2020 showed normal LV systolic function with mild LVH, normal RV systolic function trivial mitral and aortic regurgitation, and mild aortic stenosis.  Echo in 07/2022 showed an EF of 45% with lateral and posterior wall hypokinesis, mild LVH, normal RV systolic function, mild aortic insufficiency and mitral regurgitation, and mild aortic stenosis.  Echo in 04/2023 showed an EF greater than 55%, normal RV systolic function, mild aortic insufficiency and mitral regurgitation, and mild aortic stenosis with calcified aortic valve with partial fusion and restricted mobility of RCC and LCC.  Echo in 05/2023 showed an EF of 60 to 65% normal RV systolic  function and ventricular cavity size, and moderate paradoxical low-flow low gradient aortic valve stenosis.   He was admitted to the hospital in 12/2023 with multiple episodes of syncope associated with tachypalpitations and lightheadedness.  MRI of the brain showed no evidence of acute intracranial abnormality with remote lacunar infarcts and prior left thalamic hemorrhage.  He was evaluated by outside cardiology group.  Echo during the admission showed a new cardiomyopathy with an EF of 35 to 40%, global hypokinesis, severely dilated LV internal cavity size, mild concentric LVH, grade 3 diastolic dysfunction, moderately reduced RV systolic function with moderate to large ventricular cavity size, severe biatrial enlargement, mild to moderate mitral regurgitation, trivial aortic insufficiency, and moderate to severe aortic stenosis.  During the admission he was having multiple episodes of A-fib/flutter with RVR with recommendation to be maintained on apixaban , metoprolol , and was loaded with amiodarone .  Outpatient follow-up was recommended for syncope, aortic stenosis, and cardiomyopathy.   Echo in 01/2024 showed an EF of 30 to 35%, mild concentric LVH, grade 1 diastolic dysfunction, normal RV systolic function and ventricular cavity size, mild to moderate mitral regurgitation, severe calcification of the aortic valve with trivial insufficiency and moderate low-flow, low gradient stenosis.  Findings were similar to echo 1 month prior.   He established care with Dr. Darliss in 02/2024, at which time he reported shortness of breath and dizziness.  BP was soft at 92/50.  He was in sinus rhythm.  He has not been maintained on ACE inhibitor/ARB/ARNI secondary to documented angioedema with ARB.  He was continued on Farxiga  and furosemide  with recommendation to pursue cardiac cath to further evaluate cardiomyopathy and  aortic stenosis.   He presented to [facility] 03/23/2024 for planned outpatient diagnostic  catheterization which demonstrated severe left main stenosis with 70% proximal disease and otherwise mild-moderate nonobstructive CAD.  Normal left and right heart filling pressures, moderately reduced cardiac output/index, and moderate aortic stenosis.  Admission was recommended for patient to be optimized from a heart failure perspective and for further evaluation of suspected critical limb ischemia involving the left foot with progressive leukocytosis.  He was evaluated by vascular surgery and underwent angiography with no revascularization options.  Hospitalization was also complicated by atrial fibrillation/flutter with RVR, requiring IV amiodarone  with conversion to sinus rhythm.  He was discharged on oral amiodarone  load and Eliquis  2.5 mg twice daily.  GDMT, including Lasix , was held given worsening renal function.    Patient reports worsening shortness of breath for the past 3 weeks with orthopnea. He denies chest pain, lightheadedness, dizziness, palpitations, and lower extremity swelling. He has also been evaluated in the outpatient setting for hematuria with recent MRI revealing pyelonephritis. In the ED, he was afebrile with vitals signs within normal limits. Pertienent labs include BUN 40, creatinine 2.14, WBC 17.3, and hgb 12.2. ProBNP [id number]. Troponin 37 > 35. EKG without acute ischemic changes. CXR without acute changes. He was given IV Lasix  40 mg x 1. Cardiology was asked to consult for further evaluation of acute on chronic CHF.  At time of cardiology consult, patient reports improvement in dyspnea and orthopnea.  Past Medical History:  Diagnosis Date   Acute deep vein thrombosis (DVT) of right femoral vein (HCC) 10/28/2020   US  10/24/2020 [facility] Acute right lower extremity DVT extending from the common femoral vein to  the popliteal vein.     Bladder cancer (HCC)    Genital herpes    Hyperglycemia    Hypertension    Squamous cell carcinoma     Past Surgical History:  Procedure  Laterality Date   CATARACT EXTRACTION Left 10/2012   Dr. Lethea; [facility]   CYSTECTOMY W/ URETEROILEAL CONDUIT  07/24/2020   [facility] for Stage IV bladder cancer extending to prostate   EYE SURGERY Left    removed a piece of steel   FEMORAL ARTERY STENT Left 06/16/2022   SFA stenting at [facility]   HERNIA REPAIR  12/08/2006   umbilical hernia, incarderated; Dr. Dessa   LOWER EXTREMITY ANGIOGRAPHY Left 03/26/2024   Procedure: Lower Extremity Angiography;  Surgeon: Marea Selinda RAMAN, MD;  Location: [facility] INVASIVE CV LAB;  Service: Cardiovascular;  Laterality: Left;   PROSTATECTOMY  4/14/2-22   [facility] for Stage IV bladder cancer extending to prostate   RIGHT/LEFT HEART CATH AND CORONARY ANGIOGRAPHY Bilateral 03/23/2024   Procedure: RIGHT/LEFT HEART CATH AND CORONARY ANGIOGRAPHY;  Surgeon: Mady Bruckner, MD;  Location: [facility] INVASIVE CV LAB;  Service: Cardiovascular;  Laterality: Bilateral;   ROTATOR CUFF REPAIR     SQUAMOUS CELL CARCINOMA EXCISION  2008   TONSILLECTOMY         Scheduled Meds:  amiodarone   200 mg Oral Daily   apixaban   2.5 mg Oral BID   aspirin  EC  81 mg Oral Daily   atorvastatin   80 mg Oral Daily   clopidogrel   75 mg Oral Daily   ferrous sulfate   325 mg Oral Daily   fluticasone  furoate-vilanterol  1 puff Inhalation Daily   hydroxyurea   500 mg Oral Daily   levothyroxine   75 mcg Oral Daily   multivitamin with minerals  1 tablet Oral Daily   polyethylene glycol powder  17 g Oral Daily   sodium bicarbonate   1,300 mg Oral BID   sodium chloride  flush  3 mL Intravenous Q12H   Continuous Infusions:  ceFEPime  (MAXIPIME ) IV Stopped (04/18/24 1732)   PRN Meds: acetaminophen  **OR** acetaminophen , ondansetron  **OR** ondansetron  (ZOFRAN ) IV, oxyCODONE -acetaminophen  **AND** oxyCODONE   Allergies:   Allergies[1]  Social History:   Social History   Socioeconomic History   Marital status: Divorced    Spouse name: Not on file   Number of children: 1   Years of  education: Not on file   Highest education level: Some college, no degree  Occupational History   Occupation: Retired   Occupation: sales executive part time  Tobacco Use   Smoking status: Former    Current packs/day: 0.00    Average packs/day: 3.0 packs/day for 15.0 years (45.0 ttl pk-yrs)    Types: Cigarettes    Start date: 04/12/1953    Quit date: 04/12/1968    Years since quitting: 56.0   Smokeless tobacco: Never  Vaping Use   Vaping status: Never Used  Substance and Sexual Activity   Alcohol use: No    Alcohol/week: 0.0 standard drinks of alcohol   Drug use: No   Sexual activity: Not on file  Other Topics Concern   Not on file  Social History Narrative   Not on file   Social Drivers of Health   Tobacco Use: Medium Risk (04/18/2024)   Patient History    Smoking Tobacco Use: Former    Smokeless Tobacco Use: Never    Passive Exposure: Not on Actuary Strain: Low Risk  (09/16/2023)   Received from [facility]   Overall Financial Resource Strain (CARDIA)    Difficulty of Paying Living Expenses: Not hard at all  Recent Concern: Financial Resource Strain - Medium Risk (08/10/2023)   Overall Financial Resource Strain (CARDIA)    Difficulty of Paying Living Expenses: Somewhat hard  Food Insecurity: Food Insecurity Present (04/02/2024)   Epic    Worried About Programme Researcher, Broadcasting/film/video in the Last Year: Sometimes true    Ran Out of Food in the Last Year: Sometimes true  Transportation Needs: Unmet Transportation Needs (04/02/2024)   Epic    Lack of Transportation (Medical): Yes    Lack of Transportation (Non-Medical): Yes  Physical Activity: Inactive (08/10/2023)   Exercise Vital Sign    Days of Exercise per Week: 0 days    Minutes of Exercise per Session: 0 min  Stress: No Stress Concern Present (08/10/2023)   [employer] of Occupational Health - Occupational Stress Questionnaire    Feeling of Stress : Only a little  Social Connections:  Socially Isolated (03/23/2024)   Social Connection and Isolation Panel    Frequency of Communication with Friends and Family: More than three times a week    Frequency of Social Gatherings with Friends and Family: More than three times a week    Attends Religious Services: Never    Database Administrator or Organizations: No    Attends Banker Meetings: Never    Marital Status: Divorced  Catering Manager Violence: Patient Declined (04/02/2024)   Epic    Fear of Current or Ex-Partner: Patient declined    Emotionally Abused: Patient declined    Physically Abused: Patient declined    Sexually Abused: Patient declined  Depression (PHQ2-9): Low Risk (01/17/2024)   Depression (PHQ2-9)    PHQ-2 Score: 0  Alcohol Screen: Low Risk (08/10/2023)   Alcohol Screen  Last Alcohol Screening Score (AUDIT): 0  Housing: Unknown (04/02/2024)   Epic    Unable to Pay for Housing in the Last Year: No    Number of Times Moved in the Last Year: Not on file    Homeless in the Last Year: No  Utilities: Not At Risk (04/02/2024)   Epic    Threatened with loss of utilities: No  Health Literacy: Adequate Health Literacy (08/10/2023)   B1300 Health Literacy    Frequency of need for help with medical instructions: Never    Family History:    Family History  Problem Relation Age of Onset   Hypertension Mother    Heart attack Mother    Stroke Father    Cancer Father    COPD Sister    Congestive Heart Failure Brother      ROS:  Please see the history of present illness.   Physical Exam/Data: Vitals:   04/19/24 0600 04/19/24 0604 04/19/24 0730 04/19/24 0747  BP: 127/81  (!) 140/71 130/71  Pulse: 77  73 76  Resp: 14  (!) 21 19  Temp:  97.9 F (36.6 C)  97.9 F (36.6 C)  TempSrc:  Oral  Oral  SpO2:    94%    Intake/Output Summary (Last 24 hours) at 04/19/2024 1031 Last data filed at 04/19/2024 0300 Gross per 24 hour  Intake --  Output 3000 ml  Net -3000 ml      04/09/2024     9:43 AM 04/07/2024    5:18 PM 03/26/2024    2:22 PM  Last 3 Weights  Weight (lbs) 148 lb 141 lb 1.5 oz 141 lb 5 oz  Weight (kg) 67.132 kg 64 kg 64.1 kg     There is no height or weight on file to calculate BMI.  General:  Well nourished, well developed, in no acute distress HEENT: normal Neck: no JVD Vascular: No carotid bruits; Distal pulses 2+ bilaterally Cardiac:  normal S1, S2; RRR; no murmur  Lungs:  clear to auscultation bilaterally, no wheezing, rhonchi or rales  Abd: soft, nontender, no hepatomegaly  Ext: no edema Skin: warm and dry  Psych:  Normal affect   EKG:  The EKG was personally reviewed and demonstrates: Sinus rhythm with first-degree AV block and nonspecific ST/T wave abnormalities, rate 81 bpm Telemetry:  Telemetry was personally reviewed and demonstrates: Sinus rhythm  Relevant CV Studies:  R/LHC 03/23/2024:   Ost LM to Mid LM lesion is 70% stenosed.   Mid RCA to Dist RCA lesion is 35% stenosed.   LV end diastolic pressure is normal.   There is moderate aortic valve stenosis.   Conclusions: Severe left main coronary artery disease with 70% proximal stenosis.  Otherwise, mild-moderate coronary artery disease. Normal left and right heart filling pressures. Moderately reduced Fick cardiac output/index (CO 3.1 L/min, CI 1.8 L/min/m). Moderate aortic valve stenosis (peak-to-peak gradient 22 mmHg).   Recommendations: Admit for optimization of heart failure and further evaluation of suspected critical limb ischemia involving the left foot and worsening leukocytosis. May need to consider transfer to [facility] for consideration of LMCA stenting, though given the patient's comorbidities, palliative medical therapy will also need to be considered.  I do not think he is a candidate for CABG. Gentle postcatheterization hydration in the setting of low normal filling pressures and CKD stage IV. __________   2D echo 02/03/2024: 1. Left ventricular ejection fraction, by  estimation, is 30 to 35%. The  left ventricle has moderately decreased function.  Left ventricular  endocardial border not optimally defined to evaluate regional wall motion.  There is mild concentric left  ventricular hypertrophy. Left ventricular diastolic parameters are  consistent with Grade I diastolic dysfunction (impaired relaxation). The  average left ventricular global longitudinal strain is -8.4 %. The global  longitudinal strain is abnormal.   2. Right ventricular systolic function is normal. The right ventricular  size is normal.   3. Mild to moderate mitral valve regurgitation. No evidence of mitral  stenosis.   4. There is severe calcifcation of the aortic valve. Aortic valve  regurgitation is trivial. Moderate low-flow, low-gradient stenosis. Aortic  valve mean gradient measures 11.0 mmHg. Aortic valve Vmax measures 2.29  m/s. AVA 1.49, DVI 0.36.   Comparison(s): A prior study was performed on 12/17/2023. No significant  change from prior study.   Laboratory Data: High Sensitivity Troponin:  No results for input(s): TROPONINIHS in the last 720 hours.  Recent Labs  Lab 04/18/24 1141 04/18/24 1528  TRNPT 37* 35*      Chemistry Recent Labs  Lab 04/18/24 1141 04/19/24 0544  NA 141 143  K 4.5 3.8  CL 107 112*  CO2 23 22  GLUCOSE 174* 79  BUN 40* 31*  CREATININE 2.14* 1.73*  CALCIUM  8.8* 6.9*  GFRNONAA 30* 38*  ANIONGAP 11 9    No results for input(s): PROT, ALBUMIN, AST, ALT, ALKPHOS, BILITOT in the last 168 hours. Lipids No results for input(s): CHOL, TRIG, HDL, LABVLDL, LDLCALC, CHOLHDL in the last 168 hours.  Hematology Recent Labs  Lab 04/18/24 1141 04/19/24 0544  WBC 17.3* 15.2*  RBC 4.60 4.42  HGB 12.2* 11.6*  HCT 41.1 39.4  MCV 89.3 89.1  MCH 26.5 26.2  MCHC 29.7* 29.4*  RDW 26.0* 25.7*  PLT 551* 504*   Thyroid  No results for input(s): TSH, FREET4 in the last 168 hours.  BNP Recent Labs  Lab 04/18/24 1141   PROBNP 25,472.0*    DDimer No results for input(s): DDIMER in the last 168 hours.  Radiology/Studies:  DG Chest 2 View Result Date: 04/18/2024 IMPRESSION: 1. No acute cardiopulmonary disease. 2. Known hiatal hernia. Electronically Signed   By: Toribio Agreste M.D.   On: 04/18/2024 12:53   MR Abdomen W Wo Contrast Result Date: 04/17/2024 IMPRESSION: 1. There is a heterogeneous, 2.2 x 3.0 cm area in the right kidney interpolar region, posteriorly, favored to represent focal pyelonephritis. Clinical correlation and follow-up exam in 3-6 months is recommended to document resolution and exclude underlying neoplastic process. 2. There is an approximately 1.5 x 2.4 cm mixed soft tissue and macroscopic fat attenuation area adjacent to the upper pole of the left kidney, medially, which corresponds to the observation described on the recent CT scan renal stone protocol. This may represent sequela of fat necrosis or exophytic angiomyolipoma. 3. There is a multilobulated, 4.5 x 5.8 cm, cystic lesion centered in the left hepatic lobe, which exhibit fluid-fluid level, which may represent a cyst with proteinaceous/hemorrhagic contents. No mural nodularity or abnormal enhancement on the postcontrast images. 4. There are multiple T2 hyperintense lesions throughout the pancreas with largest in the pancreatic tail measuring up to 1.1 x 1.9 cm. These are favored to represent pancreatic side-branch IPMN. Correlate clinically to determine a follow-up exam in 1-2 years to document stability. 5. There is an approximately 11 x 12 mm right adrenal adenoma. 6. Other observations, as described above. Electronically Signed   By: Ree Molt M.D.   On: 04/17/2024 15:50  Assessment and Plan:  Acute on chronic systolic heart failure - Echo 87/7974 with EF 30 to 35%.  Cath 03/2024 with normal left and right heart filling pressures, moderately reduced cardiac output/index - Presenting with progressive dyspnea and orthopnea  over several weeks - proBNP 25,472 - Received IV Lasix  40 mg x 1 in the ED with symptomatic improvement - Does not appear to be grossly volume overloaded on exam - Renal function improved with creatinine 2.14 on admission, now 1.73 - Will give another dose of IV Lasix  40 mg x 1 and reevaluate renal function tomorrow - Continue to monitor kidney function, strict I/Os, and daily weights with ongoing diuresis - Patient has historically not been able to tolerate GDMT due to renal dysfunction and hypotension.  Will escalate as able. - Will likely require standing diuretic on discharge  Paroxysmal atrial fibrillation/flutter - Maintaining sinus rhythm - Continue amiodarone  and Eliquis   CAD with left main stenosis - Recent cardiac catheterization 03/2024 with severe left main coronary artery disease and otherwise mild to moderate nonobstructive disease - Not felt to be a good candidate for PCI or CABG in the setting of advanced age and other comorbidities - Denies chest pain - Continue clopidogrel , statin therapy, and DOAC in place of aspirin   Aortic stenosis - Moderate by cath 03/2024  CKD IV - Creatinine improving with diuresis - Continue to monitor closely  Pyelonephritis Anemia - Ongoing hematuria with worsening hemoglobin - Outpatient MRI with evidence of pyelonephritis - Ongoing management per IM   Risk Assessment/Risk Scores:  New York  Heart Association (NYHA) Functional Class NYHA Class II  CHA2DS2-VASc Score = 5   This indicates a 7.2% annual risk of stroke. The patient's score is based upon: CHF History: 1 HTN History: 1 Diabetes History: 0 Stroke History: 0 Vascular Disease History: 1 Age Score: 2 Gender Score: 0      For questions or updates, please contact [facility] Please consult [email or web address] for contact info under      Signed, Lesley LITTIE Maffucci, PA-C  04/19/2024 10:31 AM     [1]  Allergies Allergen Reactions   Losartan Potassium  Swelling    angioedema Other reaction(s): Angioedema   Shellfish Allergy Swelling and Rash    Rash, itching, swelling   Currently eats SHELLFISH without symptoms   Shellfish Protein-Containing Drug Products Dermatitis and Swelling    Rash, itching, swelling    Rash, itching, swelling   Currently eats SHELLFISH without symptoms   Doxycycline  Other (See Comments)    Blisters on hand   Ultram  [Tramadol Hcl] Nausea And Vomiting   Celecoxib Itching, Swelling, Rash and Other (See Comments)    Cannot take when eating shellfish   Codeine Palpitations    Heart racing   Cyclobenzaprine Other (See Comments)    Musculoskeletal therapy agents cause Excessive sedation Musculoskeletal therapy agents cause excessive sedation   Tramadol Hcl Nausea And Vomiting   "

## 2024-04-20 ENCOUNTER — Other Ambulatory Visit: Payer: Self-pay

## 2024-04-20 ENCOUNTER — Telehealth: Payer: Self-pay | Admitting: Family Medicine

## 2024-04-20 ENCOUNTER — Ambulatory Visit: Admitting: Family Medicine

## 2024-04-20 DIAGNOSIS — D72829 Elevated white blood cell count, unspecified: Secondary | ICD-10-CM | POA: Diagnosis not present

## 2024-04-20 DIAGNOSIS — I350 Nonrheumatic aortic (valve) stenosis: Secondary | ICD-10-CM | POA: Diagnosis not present

## 2024-04-20 DIAGNOSIS — R310 Gross hematuria: Secondary | ICD-10-CM | POA: Diagnosis not present

## 2024-04-20 DIAGNOSIS — N390 Urinary tract infection, site not specified: Secondary | ICD-10-CM

## 2024-04-20 DIAGNOSIS — N12 Tubulo-interstitial nephritis, not specified as acute or chronic: Secondary | ICD-10-CM | POA: Diagnosis not present

## 2024-04-20 DIAGNOSIS — I5043 Acute on chronic combined systolic (congestive) and diastolic (congestive) heart failure: Secondary | ICD-10-CM | POA: Diagnosis not present

## 2024-04-20 DIAGNOSIS — Z515 Encounter for palliative care: Secondary | ICD-10-CM | POA: Diagnosis not present

## 2024-04-20 DIAGNOSIS — I25118 Atherosclerotic heart disease of native coronary artery with other forms of angina pectoris: Secondary | ICD-10-CM | POA: Diagnosis not present

## 2024-04-20 LAB — BASIC METABOLIC PANEL WITH GFR
Anion gap: 13 (ref 5–15)
BUN: 44 mg/dL — ABNORMAL HIGH (ref 8–23)
CO2: 27 mmol/L (ref 22–32)
Calcium: 8.3 mg/dL — ABNORMAL LOW (ref 8.9–10.3)
Chloride: 100 mmol/L (ref 98–111)
Creatinine, Ser: 2.65 mg/dL — ABNORMAL HIGH (ref 0.61–1.24)
GFR, Estimated: 23 mL/min — ABNORMAL LOW
Glucose, Bld: 133 mg/dL — ABNORMAL HIGH (ref 70–99)
Potassium: 4.3 mmol/L (ref 3.5–5.1)
Sodium: 140 mmol/L (ref 135–145)

## 2024-04-20 LAB — CBC
HCT: 42.3 % (ref 39.0–52.0)
Hemoglobin: 12.8 g/dL — ABNORMAL LOW (ref 13.0–17.0)
MCH: 26.3 pg (ref 26.0–34.0)
MCHC: 30.3 g/dL (ref 30.0–36.0)
MCV: 87 fL (ref 80.0–100.0)
Platelets: 532 K/uL — ABNORMAL HIGH (ref 150–400)
RBC: 4.86 MIL/uL (ref 4.22–5.81)
RDW: 25.2 % — ABNORMAL HIGH (ref 11.5–15.5)
WBC: 15 K/uL — ABNORMAL HIGH (ref 4.0–10.5)
nRBC: 0 % (ref 0.0–0.2)

## 2024-04-20 MED ORDER — CARVEDILOL 3.125 MG PO TABS
3.1250 mg | ORAL_TABLET | Freq: Two times a day (BID) | ORAL | Status: DC
  Administered 2024-04-20 – 2024-04-21 (×2): 3.125 mg via ORAL
  Filled 2024-04-20 (×2): qty 1

## 2024-04-20 MED ORDER — SODIUM CHLORIDE 0.9 % IV SOLN: INTRAVENOUS | Status: AC

## 2024-04-20 NOTE — Assessment & Plan Note (Signed)
 Blood pressure currently within goal. - Continue Coreg  -IV Lasix  is being held

## 2024-04-20 NOTE — Telephone Encounter (Signed)
 Copied from CRM [id number]. Topic: Referral - Request for Referral >> Apr 20, 2024  1:00 PM Tiffini S wrote: Did the patient discuss referral with their provider in the last year? No (If No - schedule appointment) (If Yes - send message)  Appointment offered? Yes  Type of order/referral and detailed reason for visit: Urology with [facility]  Preference of office, provider, location: N/A   If referral order, have you been seen by this specialty before? Yes (If Yes, this issue or another issue? When? Where?  Can we respond through MyChart? No

## 2024-04-20 NOTE — Progress Notes (Signed)
 Pt refused bed alarm but was educated about its importance. Pt verbalized understanding but still refused.

## 2024-04-20 NOTE — TOC Progression Note (Addendum)
 Transition of Care [facility]) - Progression Note    Patient Details  Name: Jerry Mcpherson MRN: [id number] Date of Birth: 02-20-1941  Transition of Care [facility]) CM/SW Contact  Lauraine JAYSON Carpen, LCSW Phone Number: 04/20/2024, 10:35 AM  Clinical Narrative:   This CSW completed readmission prevention screen on 12/18:  Readmission prevention screen complete. CSW met with patient. No family at bedside. CSW introduced role and explained that discharge planning would be discussed. PCP is Nancyann Perry, MD. Patient uses [facility] to get to appointments. [facility] uses [facility] [place] and [facility]. No issues affording medications. Patient lives home alone. He is active with [facility]. Left voicemail for liaison to see which services he is getting. Patient has a walker at home but does not use it. No further concerns. CSW will continue to follow patient for support and facilitate return home once stable. Nephew will transport him home at discharge.                12:42 pm: Patient is active with [facility] for nursing.    Left message for [facility] liaison to see which services patient is currently receiving.   11:46 am: Patient was active with home health nursing prior to admission. They can add PT and OT.  12:56 pm: MD also added aide to the home health order. [facility] confirmed they can provide that.  Expected Discharge Plan and Services                                               Social Drivers of Health (SDOH) Interventions SDOH Screenings   Food Insecurity: Food Insecurity Present (04/02/2024)  Housing: Unknown (04/02/2024)  Transportation Needs: Unmet Transportation Needs (04/02/2024)  Utilities: Not At Risk (04/02/2024)  Alcohol Screen: Low Risk (08/10/2023)  Depression (PHQ2-9): Low Risk (01/17/2024)  Financial Resource Strain: Low Risk  (09/16/2023)   Received from [facility]  Recent Concern: Financial Resource Strain - Medium Risk  (08/10/2023)  Physical Activity: Inactive (08/10/2023)  Social Connections: Socially Isolated (03/23/2024)  Stress: No Stress Concern Present (08/10/2023)  Tobacco Use: Medium Risk (04/18/2024)  Health Literacy: Adequate Health Literacy (08/10/2023)    Readmission Risk Interventions    04/20/2024   10:35 AM 03/29/2024   12:33 PM  Readmission Risk Prevention Plan  Transportation Screening Complete Complete  PCP or Specialist Appt within 3-5 Days Complete Complete  HRI or Home Care Consult Complete Complete  Social Work Consult for Recovery Care Planning/Counseling Complete Complete  Palliative Care Screening Not Applicable Not Applicable  Medication Review Oceanographer) Complete Complete

## 2024-04-20 NOTE — Assessment & Plan Note (Signed)
 History of CKD stage IIIb. Slight increase in creatinine to 2.63, history of poor tolerance of diuretic in the past -Holding further IV diuresis -Monitor renal function -Avoid nephrotoxins

## 2024-04-20 NOTE — Progress Notes (Signed)
 "                                                                                                                                                                                               Palliative Care Progress Note, Assessment & Plan   Patient Name: Jerry Mcpherson       Date: 04/20/2024 DOB: 04/24/1940  Age: 84 y.o. MRN#: [id number] Attending Physician: Caleen Qualia, MD Primary Care Physician: [name], MD Admit Date: 04/18/2024  Subjective: Patient lying in bed in no apparent distress. He is awake, alert, oriented, able to make his wishes known.  No family or friends present at bedside during my visit  HPI: 84 y.o. male  with past medical history of chronic HFrEF and ischemic cardiomyopathy with diastolic dysfunction (LVEF 30-35%), chronic leukocytosis secondary to polycythemia vera, DVT, PAD of left lower extremity (lower extremity angiography 03/26/2024), colon cancer s/p cystectomy with chronic urostomy, PAF (amiodarone ), moderate aortic stenosis, hypothyroidism, CKD, and HTN admitted on 04/18/2024 with shortness of breath/nocturnal orthopnea.    Patient is being treated for acute on chronic combined ischemic and stage heart failure with moderate aortic stenosis, CAD not amenable to intervention, hematuria with MRI suggestive of focal pyelonephritis on the right, and PAD with history of critical limb ischemia on the left and nonhealing ulcer of the left foot.   Patient is familiar to PMT service as I met with patient during his December 2025 admission.  Following up today for continued conversations regarding CODE STATUS and boundaries of medical treatment.  Summary of counseling/coordination of care: Chart review completed prior to meeting patient including:  -Labs: Patient's creatinine has increased to 2.65 from 1.73, hemoglobin has mildly improved to 12.8 from 11.6, white blood cell count has fortunately stayed the same at 15.0 from 15.2, and urine culture is positive for  Enterobacter Cloacae and Pseudomonas aeruginosa -Progress notes: Reviewed TOC note which indicates patient will continue with [facility] home health as well as PT and OT at discharge  After reviewing the patient's chart and assessing the patient at bedside, I spoke with patient in regards to symptom management and goals of care.   Patient endorses that he does not have anything to say.  I reiterated my job is to make his wishes known and to support him with symptom management and goals of care discussions during this hospitalization.  Shares he is experiencing mild pain given the dressing changes just occurred on his left lower extremity.  He says he has just had Tylenol  and is waiting for that to kick in.  Discussed current medication regimen also allows for  patients to have Percocet for moderate pain.  Advised that if Tylenol  does not sufficiently reduce his pain that he is able to ask for Percocet anytime.  He endorsed understanding and appreciation.  However, he requested that I speak to him at a later date/time.  Visiting with the patient, I counseled with attending Dr. Caleen and cardiology Dr. Mady.  Conveyed that previous discussions have led to patient continuing to endorse full code and full scope.  Cardiology plans to visit with patient today.  No change to plan of care at this time.  Full code and full scope remain.  Addendum: Advised via secure chat from cardiology that patient has now endorsed DNI/DNR.  However, wants to continue to treat the treatable.  PMT will continue to follow and support.  Physical Exam Vitals reviewed.  Constitutional:      General: He is not in acute distress.    Appearance: He is normal weight.  HENT:     Head: Normocephalic.  Pulmonary:     Effort: Pulmonary effort is normal.  Skin:    General: Skin is warm and dry.  Neurological:     Mental Status: He is alert and oriented to person, place, and time.  Psychiatric:        Mood and Affect: Mood is not  anxious.        Behavior: Behavior normal. Behavior is not agitated.              Recommendations:   As per cardiology's visit with patient, patient is now a DNR/DNI Treat the treatable Utilize as needed Tylenol  as well as Percocet to address pain and left lower extremity  I personally spent a total of 35 minutes in the care of the patient today including preparing to see the patient, getting/reviewing separately obtained history, performing a medically appropriate exam/evaluation, counseling and educating, and documenting clinical information in the EHR.   Lamarr L. Arvid, DNP, FNP-BC Palliative Medicine Team   "

## 2024-04-20 NOTE — Plan of Care (Signed)
  Problem: Education: Goal: Knowledge of General Education information will improve Description: Including pain rating scale, medication(s)/side effects and non-pharmacologic comfort measures Outcome: Progressing   Problem: Clinical Measurements: Goal: Respiratory complications will improve Outcome: Progressing   Problem: Clinical Measurements: Goal: Cardiovascular complication will be avoided Outcome: Progressing   Problem: Elimination: Goal: Will not experience complications related to urinary retention Outcome: Progressing   Problem: Pain Managment: Goal: General experience of comfort will improve and/or be controlled Outcome: Progressing

## 2024-04-20 NOTE — Care Management Important Message (Signed)
 Important Message  Patient Details  Name: Jerry Mcpherson MRN: [id number] Date of Birth: 26-Mar-1941   Important Message Given:  Yes - Medicare IM     Jerry Mcpherson 04/20/2024, 3:24 PM

## 2024-04-20 NOTE — Telephone Encounter (Signed)
 Referral to urology has been placed. Attempted to call pt to advise. No answer and mb not set up. Ok for E2C2 to advise pt.

## 2024-04-20 NOTE — Progress Notes (Signed)
 " Progress Note   Patient: Jerry Mcpherson FMW:[id number] DOB: [birth year] DOA: 04/18/2024     2 DOS: the patient was seen and examined on 04/20/2024   Brief hospital course: Kannen Moxey Lewison is a 84 y.o. male with medical history significant of atrial fibrillation on amiodarone , moderate aortic stenosis, ischemic cardiomyopathy with an EF of 30 to 35% with associated diastolic dysfunction, hypertension, chronic leukocytosis secondary to polycythemia vera, hypothyroidism, prior bladder cancer status post cystectomy with a chronic urostomy tube.  Has a history of chronic kidney disease and worsening renal function after diuresis during previous admission in December 2025.  Patient presented to the ED with complaint of worsening shortness of breath, orthopnea and lower extremity edema. Patient also recently had some hematuria and had MRI done by primary care provider.  On presentation vital stable, labs with leukocytosis at 17, 300 which is around his typical range.  Creatinine at 2.14 which is down from 2.9 on the day of prior to discharge.  proBNP elevated at 25,472 and mildly elevated troponin at 37. CXR negative for any acute cardiopulmonary disease.  Patient was admitted for acute on chronic combined systolic and diastolic heart failure.  Received IV Lasix .  1/8: Vital stable on room air, labs with improving renal function with creatinine at 1.73 today.  Improving leukocytosis at 15.2.  Urine cultures pending.  Giving another dose of IV Lasix . CBC with improving leukocytosis, hemoglobin with some decreased to 11.6, continue to have gross hematuria, recent MRI done by PCP with multiple significant abnormalities-please see the full report Urology was consulted.  Not on ARB or ACE inhibitor secondary to poor renal function as well as history of angioedema Metoprolol  and Norvasc  were held secondary to suboptimal blood pressure readings prior to discharge in December.  Cardiology is planning to add  low-dose carvedilol .  Patient would like to continue full scope of medical care, palliative care is on board.  1/9: Hemodynamically stable with worsening of creatinine to 2.63, patient has an history of poor tolerance of diuretics in the past.  Holding further IV diuresis and likely will need low-dose p.o. Lasix  as needed on discharge per cardiology.  They would like to monitor for 1 more day. PT is recommending home health.  Urology is recommending follow-up with his oncologist at [facility].  Urine cultures with Enterobacter cloacae and Pseudomonas aeruginosa.  Continuing cefepime , susceptibility to Pseudomonas is not resulted yet  Assessment and Plan: * Acute on chronic combined systolic (congestive) and diastolic (congestive) heart failure (HCC) Symptoms concerning for acute on chronic combined heart failure.  Significantly elevated proBNP.  Significant improvement in lower extremity edema with 1 dose of IV Lasix . Cardiology is on board. - Further IV diuresis has been held due to increasing creatinine, likely will need low-dose Lasix  as needed for lower extremity edema on discharge -Continue with low-dose carvedilol  --Daily weight and BMP -Strict intake and output  Nonrheumatic aortic valve stenosis Patient with history of aortic stenosis with mean gradient 20 mmHg with a valve area of 1.39 cm with moderate AAS on cardiac catheterization this past December and was deemed not a candidate for intervention. - Follow-up with cardiology  Coronary artery disease of native artery of native heart with stable angina pectoris Patient has known 70% left main lesion as well as mid to distal RCA lesions 35% He was evaluated by interventional cardiology and felt not to be a good candidate either for PCI or CABG. No current chest pain, barely positive troponin with a flat curve  secondary to demand ischemia with CHF exacerbation.  Gross hematuria MRI suggestive of focal pyelonephritis on the right  ? History of bladder cancer status post cystectomy and urostomy.  Continue to have mild gross hematuria, hemoglobin stable with small improvement today. Urine cultures with Enterobacter cloacae and Pseudomonas aeruginosa, history of multiple UTIs since October with urine culture growing Klebsiella, Citrobacter, Enterobacter and Pseudomonas UTI all sensitive to cefepime   -Urology is recommending follow-up with his [facility] urologic oncologist -Continue with cefepime  for now - Follow-up final susceptibility results  Paroxysmal atrial fibrillation (HCC) Currently in NSR -Continue home Eliquis  and amiodarone   History of polycythemia vera Patient with history of polycythemia vera with associated chronic leukocytosis. -CBC seems to be around baseline -Continue home hydroxyurea   Hypertension Blood pressure currently within goal. - Continue Coreg  -IV Lasix  is being held  Chronic kidney disease (CKD), stage III (moderate) (HCC) History of CKD stage IIIb. Slight increase in creatinine to 2.63, history of poor tolerance of diuretic in the past -Holding further IV diuresis -Monitor renal function -Avoid nephrotoxins  Hypothyroidism TSH was 9.61 during most recent prior hospitalization. -Continue home Synthroid       Subjective: Patient was sitting comfortably in chair when seen today.  Very hard of hearing.  No new concern.  Physical Exam: Vitals:   04/20/24 0432 04/20/24 0500 04/20/24 0859 04/20/24 1109  BP: 104/71  110/79 108/74  Pulse: 72  75 80  Resp: 20  18 17   Temp: (!) 97.5 F (36.4 C)  97.7 F (36.5 C) 98 F (36.7 C)  TempSrc:      SpO2: 97%  93% 98%  Weight:  62.1 kg     General.  Frail, hard of hearing elderly man, in no acute distress. Pulmonary.  Lungs clear bilaterally, normal respiratory effort. CV.  Regular rate and rhythm, no JVD, rub or murmur. Abdomen.  Soft, nontender, nondistended, BS positive. CNS.  Alert and oriented .  No focal neurologic  deficit. Extremities.  No edema,  pulses intact and symmetrical. Psychiatry.  Judgment and insight appears normal.    Data Reviewed: Prior data reviewed  Family Communication: Tried calling his nephew with no response.  Disposition: Status is: Inpatient Remains inpatient appropriate because: Severity of illness  Planned Discharge Destination: Home  DVT prophylaxis.  Eliquis  Time spent: 50 minutes  This record has been created using Conservation officer, historic buildings. Errors have been sought and corrected,but may not always be located. Such creation errors do not reflect on the standard of care.   Author: Amaryllis Dare, MD 04/20/2024 3:32 PM  For on call review [email or web address].  "

## 2024-04-20 NOTE — Assessment & Plan Note (Signed)
 Currently in NSR -Continue home Eliquis  and amiodarone 

## 2024-04-20 NOTE — Progress Notes (Signed)
 "  Rounding Note   Patient Name: Jerry Mcpherson Date of Encounter: 04/20/2024  [facility] Cardiologist: None   Subjective Patient reports an episode of shortness of breath this morning requiring brief use of supplemental O2 via [place]. Now improved. Appears euvolemic with Cr up today 1.73 > 2.65. Will hold off on further diuresis. Monitor BP with addition of carvedilol  today.   Scheduled Meds:  amiodarone   200 mg Oral Daily   apixaban   2.5 mg Oral BID   aspirin  EC  81 mg Oral Daily   atorvastatin   80 mg Oral Daily   clopidogrel   75 mg Oral Daily   ferrous sulfate   325 mg Oral Daily   fluticasone  furoate-vilanterol  1 puff Inhalation Daily   hydroxyurea   500 mg Oral Daily   levothyroxine   75 mcg Oral Daily   multivitamin with minerals  1 tablet Oral Daily   polyethylene glycol powder  17 g Oral Daily   sodium bicarbonate   1,300 mg Oral BID   sodium chloride  flush  3 mL Intravenous Q12H   Continuous Infusions:  ceFEPime  (MAXIPIME ) IV Stopped (04/19/24 1939)   PRN Meds: acetaminophen  **OR** acetaminophen , ondansetron  **OR** ondansetron  (ZOFRAN ) IV, oxyCODONE -acetaminophen  **AND** oxyCODONE    Vital Signs  Vitals:   04/20/24 0010 04/20/24 0432 04/20/24 0500 04/20/24 0859  BP: 116/70 104/71  110/79  Pulse: 77 72  75  Resp: 18 20  18   Temp: 97.9 F (36.6 C) (!) 97.5 F (36.4 C)  97.7 F (36.5 C)  TempSrc:      SpO2: 97% 97%  93%  Weight:   62.1 kg     Intake/Output Summary (Last 24 hours) at 04/20/2024 0910 Last data filed at 04/20/2024 0538 Gross per 24 hour  Intake 476 ml  Output 875 ml  Net -399 ml      04/20/2024    5:00 AM 04/09/2024    9:43 AM 04/07/2024    5:18 PM  Last 3 Weights  Weight (lbs) 136 lb 14.5 oz 148 lb 141 lb 1.5 oz  Weight (kg) 62.1 kg 67.132 kg 64 kg      Telemetry Sinus rhythm - Personally Reviewed  Physical Exam  GEN: No acute distress.   Neck: No JVD Cardiac: RRR, II-III/VI systolic murmur; no rubs or gallops.  Respiratory:  Clear to auscultation bilaterally. GI: Soft, nontender, non-distended  MS: No edema; No deformity. Neuro:  Nonfocal  Psych: Normal affect   Labs High Sensitivity Troponin:  No results for input(s): TROPONINIHS in the last 720 hours.  Recent Labs  Lab 04/18/24 1141 04/18/24 1528  TRNPT 37* 35*       Chemistry Recent Labs  Lab 04/18/24 1141 04/19/24 0544 04/20/24 0355  NA 141 143 140  K 4.5 3.8 4.3  CL 107 112* 100  CO2 23 22 27   GLUCOSE 174* 79 133*  BUN 40* 31* 44*  CREATININE 2.14* 1.73* 2.65*  CALCIUM  8.8* 6.9* 8.3*  GFRNONAA 30* 38* 23*  ANIONGAP 11 9 13     Lipids No results for input(s): CHOL, TRIG, HDL, LABVLDL, LDLCALC, CHOLHDL in the last 168 hours.  Hematology Recent Labs  Lab 04/18/24 1141 04/19/24 0544 04/20/24 0355  WBC 17.3* 15.2* 15.0*  RBC 4.60 4.42 4.86  HGB 12.2* 11.6* 12.8*  HCT 41.1 39.4 42.3  MCV 89.3 89.1 87.0  MCH 26.5 26.2 26.3  MCHC 29.7* 29.4* 30.3  RDW 26.0* 25.7* 25.2*  PLT 551* 504* 532*   Thyroid  No results for input(s): TSH, FREET4 in  the last 168 hours.  BNP Recent Labs  Lab 04/18/24 1141  PROBNP 25,472.0*    DDimer No results for input(s): DDIMER in the last 168 hours.   Radiology  DG Chest 2 View Result Date: 04/18/2024 CLINICAL DATA:  Shortness of breath. EXAM: CHEST - 2 VIEW COMPARISON:  03/24/2024, 12/09/2023 and CT 04/07/2024 FINDINGS: Lungs are adequately inflated without focal airspace consolidation or effusion. Evidence of known hiatal hernia. Cardiomediastinal silhouette and remainder of the exam is unchanged. IMPRESSION: 1. No acute cardiopulmonary disease. 2. Known hiatal hernia. Electronically Signed   By: Toribio Agreste M.D.   On: 04/18/2024 12:53    Cardiac Studies  R/LHC 03/23/2024:   Ost LM to Mid LM lesion is 70% stenosed.   Mid RCA to Dist RCA lesion is 35% stenosed.   LV end diastolic pressure is normal.   There is moderate aortic valve stenosis.   Conclusions: Severe left main  coronary artery disease with 70% proximal stenosis.  Otherwise, mild-moderate coronary artery disease. Normal left and right heart filling pressures. Moderately reduced Fick cardiac output/index (CO 3.1 L/min, CI 1.8 L/min/m). Moderate aortic valve stenosis (peak-to-peak gradient 22 mmHg).   Recommendations: Admit for optimization of heart failure and further evaluation of suspected critical limb ischemia involving the left foot and worsening leukocytosis. May need to consider transfer to [facility] for consideration of LMCA stenting, though given the patient's comorbidities, palliative medical therapy will also need to be considered.  I do not think he is a candidate for CABG. Gentle postcatheterization hydration in the setting of low normal filling pressures and CKD stage IV.   2D echo 02/03/2024: 1. Left ventricular ejection fraction, by estimation, is 30 to 35%. The  left ventricle has moderately decreased function. Left ventricular  endocardial border not optimally defined to evaluate regional wall motion.  There is mild concentric left  ventricular hypertrophy. Left ventricular diastolic parameters are  consistent with Grade I diastolic dysfunction (impaired relaxation). The  average left ventricular global longitudinal strain is -8.4 %. The global  longitudinal strain is abnormal.   2. Right ventricular systolic function is normal. The right ventricular  size is normal.   3. Mild to moderate mitral valve regurgitation. No evidence of mitral  stenosis.   4. There is severe calcifcation of the aortic valve. Aortic valve  regurgitation is trivial. Moderate low-flow, low-gradient stenosis. Aortic  valve mean gradient measures 11.0 mmHg. Aortic valve Vmax measures 2.29  m/s. AVA 1.49, DVI 0.36.   Comparison(s): A prior study was performed on 12/17/2023. No significant  change from prior study.   Patient Profile   84 y.o. 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   Assessment & Plan   Acute on chronic systolic heart failure - Echo 87/7974 with EF 30 to 35%.  Cath 03/2024 with normal left and right heart filling pressures, moderately reduced cardiac output/index -  Presenting with progressive dyspnea and orthopnea over several weeks - proBNP 25,472, likely in the setting of renal dysfunction - Received IV Lasix  40 mg x 1 in the ED with symptomatic improvement as well as improvement in renal function - Renal function declining today, will hold off on further diuresis - Patient has historically not been able to tolerate GDMT due to renal dysfunction and hypotension - Will add carvedilol  3.125 mg twice daily - May need low dose standing diuretic on discharge  Paroxysmal atrial fibrillation/flutter - Maintaining sinus rhythm - Continue Eliquis  2.5 mg twice daily (age and kidney function) and amiodarone  200 mg daily - Add carvedilol  as above  CAD with LM stenosis - Recent cardiac catheterization 03/2024 with severe left main coronary artery disease and otherwise mild to moderate nonobstructive disease - Not felt to be a good candidate for PCI or CABG in the setting of advanced age and other comorbidities - Denies chest pain - Continue clopidogrel , statin therapy, and DOAC in place of aspirin   Aortic stenosis -  Moderate by cath 03/2024  CKD IV - Nephrology following - Holding diuresis  Pyelonephritis Anemia - Ongoing hematuria with worsening hemoglobin - Outpatient MRI with evidence of pyelonephritis - Ongoing management per IM and nephrology   For questions or updates, please contact [facility] Please consult [email or web address] for contact info under       Signed, Lesley LITTIE Maffucci, PA-C  04/20/2024, 9:10 AM    "

## 2024-04-20 NOTE — Assessment & Plan Note (Addendum)
 MRI suggestive of focal pyelonephritis on the right ? History of bladder cancer status post cystectomy and urostomy.  Continue to have mild gross hematuria, hemoglobin stable with small improvement today. Urine cultures with Enterobacter cloacae and Pseudomonas aeruginosa, history of multiple UTIs since October with urine culture growing Klebsiella, Citrobacter, Enterobacter and Pseudomonas UTI all sensitive to cefepime   -Urology is recommending follow-up with his [facility] urologic oncologist -Continue with cefepime  for now - Follow-up final susceptibility results

## 2024-04-20 NOTE — Progress Notes (Addendum)
 Physical Therapy Treatment Patient Details Name: Jerry Mcpherson MRN: [id number] DOB: 10-Feb-1941 Today's Date: 04/20/2024   History of Present Illness Jerry Mcpherson is a 84 y.o. male with medical history significant of atrial fibrillation on amiodarone , moderate aortic stenosis, ischemic cardiomyopathy with an EF of 30 to 35% with associated diastolic dysfunction, hypertension, chronic leukocytosis secondary to polycythemia vera, hypothyroidism, prior bladder cancer status post cystectomy with a chronic urostomy tube.  Has a history of chronic kidney disease and worsening renal function after diuresis during previous admission in December 2025.  Patient presented to the ED today with 2 complaints.  Primarily issues related to shortness of breath and orthopnea as well as lower extremity edema.  He also was complaining of significant hematuria and had undergone an MRI ordered by his primary care physician on 1/6 with results pending.    PT Comments  Patient seated on EOB on arrival. Refusing to ambulate or participate with therapy due to L foot pain. With encouragement, patient agreeable to ambulate around room to recliner. He completed this with CGA and reaching for objects in room for steadying. Refusing to use RW for short distances but agreeable to using RW for longer distances. He states he is agreeable to ambulate once L foot pain reduces. Discharge plan remains appropriate.     If plan is discharge home, recommend the following: A little help with walking and/or transfers;A little help with bathing/dressing/bathroom;Help with stairs or ramp for entrance;Assist for transportation   Can travel by private vehicle        Equipment Recommendations  Rolling [name] (2 wheels)    Recommendations for Other Services       Precautions / Restrictions Precautions Precautions: Fall Recall of Precautions/Restrictions: Intact Restrictions Weight Bearing Restrictions Per Provider Order: No      Mobility  Bed Mobility               General bed mobility comments: seated EOB on arrival and in recliner at end of session    Transfers Overall transfer level: Needs assistance Equipment used: None Transfers: Sit to/from Stand Sit to Stand: Contact guard assist                Ambulation/Gait Ambulation/Gait assistance: Contact guard assist Gait Distance (Feet): 20 Feet Assistive device: 1 person hand held assist Gait Pattern/deviations: Step-through pattern Gait velocity: decreased     General Gait Details: reaching for objects in room to ambulate around room into recliner. Refuses to use Rw for short distance. Refused further mobility 2/2 L foot pain   Stairs             Wheelchair Mobility     Tilt Bed    Modified Rankin (Stroke Patients Only)       Balance Overall balance assessment: Needs assistance Sitting-balance support: Feet supported Sitting balance-Leahy Scale: Good     Standing balance support: No upper extremity supported, During functional activity Standing balance-Leahy Scale: Poor                              Communication Communication Communication: Impaired Factors Affecting Communication: Hearing impaired  Cognition Arousal: Alert Behavior During Therapy: WFL for tasks assessed/performed   PT - Cognitive impairments: No apparent impairments                         Following commands: Intact      Cueing  Exercises      General Comments        Pertinent Vitals/Pain Pain Assessment Pain Assessment: Faces Faces Pain Scale: Hurts whole lot Pain Location: L foot Pain Descriptors / Indicators: Discomfort, Grimacing, Aching Pain Intervention(s): Limited activity within patient's tolerance, Monitored during session, Repositioned    Home Living                          Prior Function            PT Goals (current goals can now be found in the care plan section) Acute  Rehab PT Goals Patient Stated Goal: Pt wants to get stronger PT Goal Formulation: With patient Time For Goal Achievement: 05/03/24 Potential to Achieve Goals: Good Progress towards PT goals: Progressing toward goals    Frequency    Min 2X/week      PT Plan      Co-evaluation              AM-PAC PT 6 Clicks Mobility   Outcome Measure  Help needed turning from your back to your side while in a flat bed without using bedrails?: None Help needed moving from lying on your back to sitting on the side of a flat bed without using bedrails?: None Help needed moving to and from a bed to a chair (including a wheelchair)?: A Little Help needed standing up from a chair using your arms (e.g., wheelchair or bedside chair)?: A Little Help needed to walk in hospital room?: A Little Help needed climbing 3-5 steps with a railing? : A Little 6 Click Score: 20    End of Session   Activity Tolerance: Patient limited by pain Patient left: in chair;with call bell/phone within reach;with chair alarm set Nurse Communication: Mobility status PT Visit Diagnosis: Other abnormalities of gait and mobility (R26.89);Difficulty in walking, not elsewhere classified (R26.2)     Time: 1057-1110 PT Time Calculation (min) (ACUTE ONLY): 13 min  Charges:    $Therapeutic Activity: 8-22 mins PT General Charges $$ ACUTE PT VISIT: 1 Visit                     Maryanne Finder, PT, DPT Physical Therapist - [facility]  [facility]    Neena Beecham A Azaylia Fong 04/20/2024, 12:49 PM

## 2024-04-20 NOTE — Progress Notes (Addendum)
 " [facility]  ROUNDING NOTE   Subjective:   Jerry Mcpherson is a 84 year old male with past medical conditions including atrial fibrillation on amiodarone , ischemic cardiomyopathy, systolic heart failure with EF 30 to 35% with diastolic dysfunction, hypertension, bladder cancer status post cystectomy with chronic urostomy tube, and chronic kidney disease stage IV.  Patient presents to the emergency department with shortness of breath and hematuria and has been admitted for Pinched nerve [G58.9] Acute on chronic combined systolic (congestive) and diastolic (congestive) heart failure (HCC) [I50.43] Radiculopathy, unspecified spinal region [M54.10] Leukocytosis, unspecified type [D72.829] Hypothyroidism, unspecified type [E03.9] Acute on chronic congestive heart failure, unspecified heart failure type [facility]) [I50.9]  Patient is known our practice from previous admission.    Patient seen sitting up in bed Breakfast at bedside Denies pain Room air Hematuria continues    Objective:  Vital signs in last 24 hours:  Temp:  [97.5 F (36.4 C)-98.9 F (37.2 C)] 98 F (36.7 C) (01/09 1109) Pulse Rate:  [72-83] 80 (01/09 1109) Resp:  [16-20] 17 (01/09 1109) BP: (104-126)/(70-79) 108/74 (01/09 1109) SpO2:  [93 %-99 %] 98 % (01/09 1109) Weight:  [62.1 kg] 62.1 kg (01/09 0500)  Weight change:  Filed Weights   04/20/24 0500  Weight: 62.1 kg    Intake/Output: I/O last 3 completed shifts: In: 476 [P.O.:476] Out: 2075 [Urine:2075]   Intake/Output this shift:  Total I/O In: 3 [I.V.:3] Out: -   Physical Exam: General: NAD  Head: Normocephalic, atraumatic. Moist oral mucosal membranes  Eyes: Anicteric  Lungs:  Clear to auscultation  Heart: Regular rate and rhythm  Abdomen:  Soft, nontender  Extremities:  Trace peripheral edema.  Neurologic: Awake, alert, conversant  Skin: Warm,dry, no rash       Basic Metabolic Panel: Recent Labs  Lab 04/18/24 1141  04/19/24 0544 04/20/24 0355  NA 141 143 140  K 4.5 3.8 4.3  CL 107 112* 100  CO2 23 22 27   GLUCOSE 174* 79 133*  BUN 40* 31* 44*  CREATININE 2.14* 1.73* 2.65*  CALCIUM  8.8* 6.9* 8.3*    Liver Function Tests: No results for input(s): AST, ALT, ALKPHOS, BILITOT, PROT, ALBUMIN in the last 168 hours. No results for input(s): LIPASE, AMYLASE in the last 168 hours. No results for input(s): AMMONIA in the last 168 hours.  CBC: Recent Labs  Lab 04/18/24 1141 04/19/24 0544 04/20/24 0355  WBC 17.3* 15.2* 15.0*  HGB 12.2* 11.6* 12.8*  HCT 41.1 39.4 42.3  MCV 89.3 89.1 87.0  PLT 551* 504* 532*    Cardiac Enzymes: No results for input(s): CKTOTAL, CKMB, CKMBINDEX, TROPONINI in the last 168 hours.  BNP: Invalid input(s): POCBNP  CBG: No results for input(s): GLUCAP in the last 168 hours.  Microbiology: Results for orders placed or performed during the hospital encounter of 04/18/24  Urine Culture (for pregnant, neutropenic or urologic patients or patients with an indwelling urinary catheter)     Status: Abnormal (Preliminary result)   Collection Time: 04/18/24  3:28 PM   Specimen: Urine, Clean Catch  Result Value Ref Range Status   Specimen Description   Final    URINE, CLEAN CATCH Performed at [facility], [address], [place], [place] [address]    Special Requests   Final    NONE Performed at [facility], [address] [place], [place] [address]    Culture (A)  Final    >=100,000 COLONIES/mL ENTEROBACTER CLOACAE >=100,000 COLONIES/mL PSEUDOMONAS AERUGINOSA SUSCEPTIBILITIES TO FOLLOW Performed  at [facility], [address] [place], [place] [address]    Report Status PENDING  Incomplete   Organism ID, Bacteria ENTEROBACTER CLOACAE (A)  Final      Susceptibility   Enterobacter cloacae - MIC*    CEFEPIME  1 SENSITIVE Sensitive     CIPROFLOXACIN  0.5 INTERMEDIATE Intermediate     GENTAMICIN <=1 SENSITIVE  Sensitive     NITROFURANTOIN 64 INTERMEDIATE Intermediate     TRIMETH /SULFA  <=20 SENSITIVE Sensitive     PIP/TAZO Value in next row Resistant      >=128 RESISTANTThis is a modified FDA-approved test that has been validated and its performance characteristics determined by the reporting laboratory.  This laboratory is certified under the Clinical Laboratory Improvement Amendments CLIA as qualified to perform high complexity clinical laboratory testing.    MEROPENEM Value in next row Sensitive      >=128 RESISTANTThis is a modified FDA-approved test that has been validated and its performance characteristics determined by the reporting laboratory.  This laboratory is certified under the Clinical Laboratory Improvement Amendments CLIA as qualified to perform high complexity clinical laboratory testing.    * >=100,000 COLONIES/mL ENTEROBACTER CLOACAE    Coagulation Studies: Recent Labs    04/18/24 1141  LABPROT 16.6*  INR 1.3*    Urinalysis: Recent Labs    04/18/24 1528  COLORURINE STRAW*  LABSPEC 1.005  PHURINE 7.0  GLUCOSEU NEGATIVE  HGBUR LARGE*  BILIRUBINUR NEGATIVE  KETONESUR NEGATIVE  PROTEINUR NEGATIVE  NITRITE POSITIVE*  LEUKOCYTESUR SMALL*      Imaging: No results found.    Medications:    sodium chloride  40 mL/hr at 04/20/24 1329   ceFEPime  (MAXIPIME ) IV Stopped (04/19/24 1939)    amiodarone   200 mg Oral Daily   apixaban   2.5 mg Oral BID   aspirin  EC  81 mg Oral Daily   atorvastatin   80 mg Oral Daily   carvedilol   3.125 mg Oral BID WC   clopidogrel   75 mg Oral Daily   ferrous sulfate   325 mg Oral Daily   fluticasone  furoate-vilanterol  1 puff Inhalation Daily   hydroxyurea   500 mg Oral Daily   levothyroxine   75 mcg Oral Daily   multivitamin with minerals  1 tablet Oral Daily   polyethylene glycol powder  17 g Oral Daily   sodium bicarbonate   1,300 mg Oral BID   sodium chloride  flush  3 mL Intravenous Q12H   acetaminophen  **OR** acetaminophen ,  ondansetron  **OR** ondansetron  (ZOFRAN ) IV, oxyCODONE -acetaminophen  **AND** oxyCODONE   Assessment/ Plan:  Jerry Mcpherson is a 84 y.o.  male with past medical conditions including atrial fibrillation on amiodarone , ischemic cardiomyopathy, systolic heart failure with EF 30 to 35% with diastolic dysfunction, hypertension, bladder cancer status post cystectomy with chronic urostomy tube, and chronic kidney disease stage IV.  Patient presents to the emergency department with shortness of breath and hematuria and has been admitted for Pinched nerve [G58.9] Acute on chronic combined systolic (congestive) and diastolic (congestive) heart failure (HCC) [I50.43] Radiculopathy, unspecified spinal region [M54.10] Leukocytosis, unspecified type [D72.829] Hypothyroidism, unspecified type [E03.9] Acute on chronic congestive heart failure, unspecified heart failure type (HCC) [I50.9]   Acute Kidney Injury on chronic kidney disease stage IV with baseline creatinine 2.3 and GFR of 27 on 03/27/24.  Acute kidney injury likely secondary to focal pyelonephritis seen on abdominal MRI.  Visual confirmation of hematuria along with UA.    Creatinine elevated today, hematuria continues.Did receive furosemide  yesterday.  Appreciate urology recs. Will continue  to monitor.    Lab Results  Component Value Date   CREATININE 2.65 (H) 04/20/2024   CREATININE 1.73 (H) 04/19/2024   CREATININE 2.14 (H) 04/18/2024    Intake/Output Summary (Last 24 hours) at 04/20/2024 1409 Last data filed at 04/20/2024 0940 Gross per 24 hour  Intake 479 ml  Output 875 ml  Net -396 ml   2.  Gross hematuria, right focal pyelonephritis seen on abdominal MRI.  Has history of bladder cancer status post cystectomy with urostomy.  Recent UTI diagnosis of Pseudomonas.  Currently being treated with cefepime .  3. Anemia of chronic kidney disease with acute blood loss Lab Results  Component Value Date   HGB 12.8 (L) 04/20/2024     Hemoglobin within optimal range.  Hematuria remains.  4. Secondary Hyperparathyroidism: with outpatient labs: None available  Lab Results  Component Value Date   CALCIUM  8.3 (L) 04/20/2024   CAION 1.15 03/23/2024   PHOS 4.0 01/28/2016    Calcium  and phos stable  5.  Hypertension with chronic kidney disease.  Home regimen includes amlodipine  and metoprolol .  Both currently held.   LOS: 2 Oshea Percival 1/9/20262:09 PM   "

## 2024-04-20 NOTE — Assessment & Plan Note (Signed)
 Symptoms concerning for acute on chronic combined heart failure.  Significantly elevated proBNP.  Significant improvement in lower extremity edema with 1 dose of IV Lasix . Cardiology is on board. - Further IV diuresis has been held due to increasing creatinine, likely will need low-dose Lasix  as needed for lower extremity edema on discharge -Continue with low-dose carvedilol  --Daily weight and BMP -Strict intake and output

## 2024-04-21 ENCOUNTER — Other Ambulatory Visit: Payer: Self-pay

## 2024-04-21 DIAGNOSIS — I350 Nonrheumatic aortic (valve) stenosis: Secondary | ICD-10-CM | POA: Diagnosis not present

## 2024-04-21 DIAGNOSIS — D72829 Elevated white blood cell count, unspecified: Secondary | ICD-10-CM | POA: Diagnosis not present

## 2024-04-21 DIAGNOSIS — E039 Hypothyroidism, unspecified: Secondary | ICD-10-CM | POA: Diagnosis not present

## 2024-04-21 DIAGNOSIS — N184 Chronic kidney disease, stage 4 (severe): Secondary | ICD-10-CM

## 2024-04-21 DIAGNOSIS — I5043 Acute on chronic combined systolic (congestive) and diastolic (congestive) heart failure: Secondary | ICD-10-CM | POA: Diagnosis not present

## 2024-04-21 LAB — CBC
HCT: 39.6 % (ref 39.0–52.0)
Hemoglobin: 12.1 g/dL — ABNORMAL LOW (ref 13.0–17.0)
MCH: 26.7 pg (ref 26.0–34.0)
MCHC: 30.6 g/dL (ref 30.0–36.0)
MCV: 87.2 fL (ref 80.0–100.0)
Platelets: 475 K/uL — ABNORMAL HIGH (ref 150–400)
RBC: 4.54 MIL/uL (ref 4.22–5.81)
RDW: 24.9 % — ABNORMAL HIGH (ref 11.5–15.5)
WBC: 14.9 K/uL — ABNORMAL HIGH (ref 4.0–10.5)
nRBC: 0 % (ref 0.0–0.2)

## 2024-04-21 LAB — URINE CULTURE: Culture: >=100000 — AB

## 2024-04-21 LAB — BASIC METABOLIC PANEL WITH GFR
Anion gap: 10 (ref 5–15)
BUN: 49 mg/dL — ABNORMAL HIGH (ref 8–23)
CO2: 25 mmol/L (ref 22–32)
Calcium: 8.6 mg/dL — ABNORMAL LOW (ref 8.9–10.3)
Chloride: 105 mmol/L (ref 98–111)
Creatinine, Ser: 2.5 mg/dL — ABNORMAL HIGH (ref 0.61–1.24)
GFR, Estimated: 25 mL/min — ABNORMAL LOW
Glucose, Bld: 100 mg/dL — ABNORMAL HIGH (ref 70–99)
Potassium: 4 mmol/L (ref 3.5–5.1)
Sodium: 141 mmol/L (ref 135–145)

## 2024-04-21 MED ORDER — CARVEDILOL 3.125 MG PO TABS
3.1250 mg | ORAL_TABLET | Freq: Two times a day (BID) | ORAL | 1 refills | Status: AC
  Filled 2024-04-21: qty 60, 30d supply, fill #0

## 2024-04-21 MED ORDER — FUROSEMIDE 40 MG PO TABS
40.0000 mg | ORAL_TABLET | Freq: Every day | ORAL | 11 refills | Status: AC | PRN
  Filled 2024-04-21: qty 30, 30d supply, fill #0

## 2024-04-21 MED ORDER — SULFAMETHOXAZOLE-TRIMETHOPRIM 400-80 MG PO TABS
1.0000 | ORAL_TABLET | Freq: Two times a day (BID) | ORAL | 0 refills | Status: AC
  Filled 2024-04-21: qty 6, 3d supply, fill #0

## 2024-04-21 MED ORDER — SODIUM CHLORIDE 0.9 % IV SOLN
2.0000 g | Freq: Once | INTRAVENOUS | Status: AC
  Administered 2024-04-21: 2 g via INTRAVENOUS
  Filled 2024-04-21: qty 12.5

## 2024-04-21 MED ORDER — SULFAMETHOXAZOLE-TRIMETHOPRIM 400-80 MG PO TABS: 1.0000 | ORAL_TABLET | Freq: Two times a day (BID) | ORAL | Status: DC

## 2024-04-21 MED ORDER — FUROSEMIDE 20 MG PO TABS
20.0000 mg | ORAL_TABLET | Freq: Every day | ORAL | 11 refills | Status: DC | PRN
  Filled 2024-04-21: qty 30, 30d supply, fill #0

## 2024-04-21 MED ORDER — CIPROFLOXACIN HCL 500 MG PO TABS: 500.0000 mg | ORAL_TABLET | Freq: Every day | ORAL | Status: DC

## 2024-04-21 MED ORDER — CIPROFLOXACIN HCL 500 MG PO TABS
500.0000 mg | ORAL_TABLET | Freq: Every day | ORAL | 0 refills | Status: AC
  Filled 2024-04-21: qty 10, 10d supply, fill #0

## 2024-04-21 MED ORDER — LEVOTHYROXINE SODIUM 75 MCG PO TABS
75.0000 ug | ORAL_TABLET | Freq: Every day | ORAL | 1 refills | Status: DC
  Filled 2024-04-21: qty 30, 30d supply, fill #0

## 2024-04-21 NOTE — TOC Transition Note (Signed)
 Transition of Care [facility]) - Discharge Note   Patient Details  Name: Jerry Mcpherson MRN: [id number] Date of Birth: [birth year]  Transition of Care [facility]) CM/SW Contact:  Victory Jackquline RAMAN, RN Phone Number: 04/21/2024, 10:52 AM   Clinical Narrative:    Patient discharge home with [facility]. Patient is active with [facility] and will resume services. RNCM called patient's nephew Garrel @ [phone], notified of patient being discharged and he informed me that patient drove himself to hospital and car is parked by the ED and he would be driving himself home. MD and bedside nurse made aware. Pt has discharge orders, no further concerns. RNCM signing off.   Final next level of care: Home w Home Health Services Barriers to Discharge: Barriers Resolved   Patient Goals and CMS Choice            Discharge Placement                Patient to be transferred to facility by: Self/Patient drove himself to hospital and car is parked by the ED Name of family member notified: Garrel Patient and family notified of of transfer: 04/21/24  Discharge Plan and Services Additional resources added to the After Visit Summary for                                    Representative spoke with at [facility] Agency: Patient is active with [facility] and will be resuming services with them. Message sent via EPIC to [facility] notifying them that patient was being discharged today.  Social Drivers of Health (SDOH) Interventions SDOH Screenings   Food Insecurity: Food Insecurity Present (04/21/2024)  Housing: Low Risk (04/21/2024)  Transportation Needs: Unmet Transportation Needs (04/21/2024)  Utilities: Not At Risk (04/21/2024)  Alcohol Screen: Low Risk (08/10/2023)  Depression (PHQ2-9): Low Risk (01/17/2024)  Financial Resource Strain: Low Risk  (09/16/2023)   Received from [facility]  Recent Concern: Financial Resource Strain - Medium Risk (08/10/2023)  Physical Activity: Inactive (08/10/2023)   Social Connections: Socially Isolated (04/21/2024)  Stress: No Stress Concern Present (08/10/2023)  Tobacco Use: Medium Risk (04/18/2024)  Health Literacy: Adequate Health Literacy (08/10/2023)     Readmission Risk Interventions    04/20/2024   10:35 AM 03/29/2024   12:33 PM  Readmission Risk Prevention Plan  Transportation Screening Complete Complete  PCP or Specialist Appt within 3-5 Days Complete Complete  HRI or Home Care Consult Complete Complete  Social Work Consult for Recovery Care Planning/Counseling Complete Complete  Palliative Care Screening Not Applicable Not Applicable  Medication Review Oceanographer) Complete Complete

## 2024-04-21 NOTE — Progress Notes (Addendum)
 " [facility]  ROUNDING NOTE   Subjective:   Jerry Mcpherson is a 84 year old male with past medical conditions including atrial fibrillation on amiodarone , ischemic cardiomyopathy, systolic heart failure with EF 30 to 35% with diastolic dysfunction, hypertension, bladder cancer status post cystectomy with chronic urostomy tube, and chronic kidney disease stage IV.  Patient presents to the emergency department with shortness of breath and hematuria and has been admitted for Pinched nerve [G58.9] Acute on chronic combined systolic (congestive) and diastolic (congestive) heart failure (HCC) [I50.43] Radiculopathy, unspecified spinal region [M54.10] Leukocytosis, unspecified type [D72.829] Hypothyroidism, unspecified type [E03.9] Acute on chronic congestive heart failure, unspecified heart failure type [facility]) [I50.9]  Patient is known our practice from previous admission.    No acute events overnight. Patient HOH denies issues, eager to go home. Patient acknowledges plan to follow-up with [facility] and oncology.    Objective:  Vital signs in last 24 hours:  Temp:  [97.5 F (36.4 C)-97.8 F (36.6 C)] 97.8 F (36.6 C) (01/10 0901) Pulse Rate:  [69-77] 69 (01/10 0901) Resp:  [18] 18 (01/10 0445) BP: (107-123)/(65-78) 123/78 (01/10 0901) SpO2:  [97 %-99 %] 98 % (01/10 0901) Weight:  [62.6 kg] 62.6 kg (01/10 0500)  Weight change: 0.5 kg Filed Weights   04/20/24 0500 04/21/24 0500  Weight: 62.1 kg 62.6 kg    Intake/Output: I/O last 3 completed shifts: In: 1270.6 [P.O.:716; I.V.:454.6; IV Piggyback:100] Out: 1075 [Urine:1075]   Intake/Output this shift:  Total I/O In: 240 [P.O.:240] Out: 750 [Urine:750]  Physical Exam: General: NAD  Head: Normocephalic  Eyes: Anicteric  Lungs:  Clear to auscultation  Heart: Regular rate and rhythm  Abdomen:  Soft, nontender  Extremities:  Trace peripheral edema.  Neurologic: Awake, alert, conversant  Skin: Warm,dry, no  rash       Basic Metabolic Panel: Recent Labs  Lab 04/18/24 1141 04/19/24 0544 04/20/24 0355 04/21/24 0508  NA 141 143 140 141  K 4.5 3.8 4.3 4.0  CL 107 112* 100 105  CO2 23 22 27 25   GLUCOSE 174* 79 133* 100*  BUN 40* 31* 44* 49*  CREATININE 2.14* 1.73* 2.65* 2.50*  CALCIUM  8.8* 6.9* 8.3* 8.6*    Liver Function Tests: No results for input(s): AST, ALT, ALKPHOS, BILITOT, PROT, ALBUMIN in the last 168 hours. No results for input(s): LIPASE, AMYLASE in the last 168 hours. No results for input(s): AMMONIA in the last 168 hours.  CBC: Recent Labs  Lab 04/18/24 1141 04/19/24 0544 04/20/24 0355 04/21/24 0508  WBC 17.3* 15.2* 15.0* 14.9*  HGB 12.2* 11.6* 12.8* 12.1*  HCT 41.1 39.4 42.3 39.6  MCV 89.3 89.1 87.0 87.2  PLT 551* 504* 532* 475*    Cardiac Enzymes: No results for input(s): CKTOTAL, CKMB, CKMBINDEX, TROPONINI in the last 168 hours.  BNP: Invalid input(s): POCBNP  CBG: No results for input(s): GLUCAP in the last 168 hours.  Microbiology: Results for orders placed or performed during the hospital encounter of 04/18/24  Urine Culture (for pregnant, neutropenic or urologic patients or patients with an indwelling urinary catheter)     Status: Abnormal   Collection Time: 04/18/24  3:28 PM   Specimen: Urine, Clean Catch  Result Value Ref Range Status   Specimen Description   Final    URINE, CLEAN CATCH Performed at [facility], [address] [place], [place] [address]    Special Requests   Final    NONE Performed at [facility], [address]  [place], [place] [address]    Culture (A)  Final    >=100,000 COLONIES/mL ENTEROBACTER CLOACAE >=100,000 COLONIES/mL PSEUDOMONAS AERUGINOSA    Report Status 04/21/2024 FINAL  Final   Organism ID, Bacteria ENTEROBACTER CLOACAE (A)  Final   Organism ID, Bacteria PSEUDOMONAS AERUGINOSA (A)  Final      Susceptibility   Enterobacter cloacae - MIC*    CEFEPIME   1 SENSITIVE Sensitive     CIPROFLOXACIN  0.5 INTERMEDIATE Intermediate     GENTAMICIN <=1 SENSITIVE Sensitive     NITROFURANTOIN 64 INTERMEDIATE Intermediate     TRIMETH /SULFA  <=20 SENSITIVE Sensitive     PIP/TAZO Value in next row Resistant      >=128 RESISTANTThis is a modified FDA-approved test that has been validated and its performance characteristics determined by the reporting laboratory.  This laboratory is certified under the Clinical Laboratory Improvement Amendments CLIA as qualified to perform high complexity clinical laboratory testing.    MEROPENEM Value in next row Sensitive      >=128 RESISTANTThis is a modified FDA-approved test that has been validated and its performance characteristics determined by the reporting laboratory.  This laboratory is certified under the Clinical Laboratory Improvement Amendments CLIA as qualified to perform high complexity clinical laboratory testing.    * >=100,000 COLONIES/mL ENTEROBACTER CLOACAE   Pseudomonas aeruginosa - MIC*    MEROPENEM Value in next row Sensitive      >=128 RESISTANTThis is a modified FDA-approved test that has been validated and its performance characteristics determined by the reporting laboratory.  This laboratory is certified under the Clinical Laboratory Improvement Amendments CLIA as qualified to perform high complexity clinical laboratory testing.    CIPROFLOXACIN  Value in next row Sensitive      >=128 RESISTANTThis is a modified FDA-approved test that has been validated and its performance characteristics determined by the reporting laboratory.  This laboratory is certified under the Clinical Laboratory Improvement Amendments CLIA as qualified to perform high complexity clinical laboratory testing.    IMIPENEM Value in next row Sensitive      >=128 RESISTANTThis is a modified FDA-approved test that has been validated and its performance characteristics determined by the reporting laboratory.  This laboratory is certified  under the Clinical Laboratory Improvement Amendments CLIA as qualified to perform high complexity clinical laboratory testing.    PIP/TAZO Value in next row Sensitive      <=4 SENSITIVEThis is a modified FDA-approved test that has been validated and its performance characteristics determined by the reporting laboratory.  This laboratory is certified under the Clinical Laboratory Improvement Amendments CLIA as qualified to perform high complexity clinical laboratory testing.    CEFTAZIDIME/AVIBACTAM Value in next row Sensitive      <=4 SENSITIVEThis is a modified FDA-approved test that has been validated and its performance characteristics determined by the reporting laboratory.  This laboratory is certified under the Clinical Laboratory Improvement Amendments CLIA as qualified to perform high complexity clinical laboratory testing.    CEFTOLOZANE/TAZOBACTAM Value in next row Resistant      <=4 SENSITIVEThis is a modified FDA-approved test that has been validated and its performance characteristics determined by the reporting laboratory.  This laboratory is certified under the Clinical Laboratory Improvement Amendments CLIA as qualified to perform high complexity clinical laboratory testing.    TOBRAMYCIN Value in next row Sensitive      <=4 SENSITIVEThis is a modified FDA-approved test that has been validated and its performance characteristics determined by the reporting laboratory.  This laboratory is certified under the Clinical  Laboratory Improvement Amendments CLIA as qualified to perform high complexity clinical laboratory testing.    CEFTAZIDIME Value in next row Sensitive      <=4 SENSITIVEThis is a modified FDA-approved test that has been validated and its performance characteristics determined by the reporting laboratory.  This laboratory is certified under the Clinical Laboratory Improvement Amendments CLIA as qualified to perform high complexity clinical laboratory testing.    * >=100,000  COLONIES/mL PSEUDOMONAS AERUGINOSA    Coagulation Studies: No results for input(s): LABPROT, INR in the last 72 hours.   Urinalysis: No results for input(s): COLORURINE, LABSPEC, PHURINE, GLUCOSEU, HGBUR, BILIRUBINUR, KETONESUR, PROTEINUR, UROBILINOGEN, NITRITE, LEUKOCYTESUR in the last 72 hours.  Invalid input(s): APPERANCEUR     Imaging: No results found.    Medications:      amiodarone   200 mg Oral Daily   apixaban   2.5 mg Oral BID   atorvastatin   80 mg Oral Daily   carvedilol   3.125 mg Oral BID WC   [START ON 04/22/2024] ciprofloxacin   500 mg Oral Q breakfast   clopidogrel   75 mg Oral Daily   ferrous sulfate   325 mg Oral Daily   fluticasone  furoate-vilanterol  1 puff Inhalation Daily   hydroxyurea   500 mg Oral Daily   levothyroxine   75 mcg Oral Daily   multivitamin with minerals  1 tablet Oral Daily   polyethylene glycol powder  17 g Oral Daily   sodium bicarbonate   1,300 mg Oral BID   sodium chloride  flush  3 mL Intravenous Q12H   [START ON 04/22/2024] sulfamethoxazole -trimethoprim   1 tablet Oral Q12H   acetaminophen  **OR** acetaminophen , ondansetron  **OR** ondansetron  (ZOFRAN ) IV, oxyCODONE -acetaminophen  **AND** oxyCODONE   Assessment/ Plan:  [name] is a 84 y.o.  male with past medical conditions including atrial fibrillation on amiodarone , ischemic cardiomyopathy, systolic heart failure with EF 30 to 35% with diastolic dysfunction, hypertension, bladder cancer status post cystectomy with chronic urostomy tube, and chronic kidney disease stage IV.  Patient presents to the emergency department with shortness of breath and hematuria and has been admitted for Pinched nerve [G58.9] Acute on chronic combined systolic (congestive) and diastolic (congestive) heart failure (HCC) [I50.43] Radiculopathy, unspecified spinal region [M54.10] Leukocytosis, unspecified type [D72.829] Hypothyroidism, unspecified type [E03.9] Acute on  chronic congestive heart failure, unspecified heart failure type (HCC) [I50.9]   Acute Kidney Injury on chronic kidney disease stage IV with baseline creatinine 2.3 and GFR of 27 on 03/27/24.  Acute kidney injury likely secondary to focal pyelonephritis seen on abdominal MRI.  Visual confirmation of hematuria along with UA.    Creatinine elevated today, hematuria continues. Did receive furosemide  this admission. Appreciate urology recs. Will continue  to monitor.    Lab Results  Component Value Date   CREATININE 2.50 (H) 04/21/2024   CREATININE 2.65 (H) 04/20/2024   CREATININE 1.73 (H) 04/19/2024    Intake/Output Summary (Last 24 hours) at 04/21/2024 1644 Last data filed at 04/21/2024 1039 Gross per 24 hour  Intake 1031.56 ml  Output 1250 ml  Net -218.44 ml   2.  Gross hematuria, right focal pyelonephritis seen on abdominal MRI.  Has history of bladder cancer status post cystectomy with urostomy.  Recent UTI diagnosis of Pseudomonas.  Currently being treated with cefepime .  3. Anemia of chronic kidney disease with acute blood loss Lab Results  Component Value Date   HGB 12.1 (L) 04/21/2024    Hemoglobin within optimal range.  Hematuria remains.  4. Secondary Hyperparathyroidism: with outpatient labs: None available  Lab  Results  Component Value Date   CALCIUM  8.6 (L) 04/21/2024   CAION 1.15 03/23/2024   PHOS 4.0 01/28/2016    Calcium  and phos stable  5.  Hypertension with chronic kidney disease.  Home regimen includes amlodipine  and metoprolol .  Both currently held.   LOS: 3 Cheick Suhr SHAUNNA Dines 1/10/20264:44 PM   "

## 2024-04-21 NOTE — Discharge Summary (Addendum)
 " Physician Discharge Summary   Patient: Jerry Mcpherson MRN: [id number] DOB: 1940/08/26  Admit date:     04/18/2024  Discharge date: 04/21/2024  Discharge Physician: Amaryllis Dare   PCP: [name], MD   Recommendations at discharge:  Please obtain CBC and BMP on follow-up Follow-up with cardiology Follow-up with primary care provider  Discharge Diagnoses: Principal Problem:   Acute on chronic combined systolic (congestive) and diastolic (congestive) heart failure (HCC) Active Problems:   Nonrheumatic aortic valve stenosis   Coronary artery disease of native artery of native heart with stable angina pectoris   Gross hematuria   Pyelonephritis   Paroxysmal atrial fibrillation (HCC)   Leukocytosis   History of polycythemia vera   Hypertension   Chronic kidney disease (CKD), stage III (moderate) (HCC)   Hypothyroidism   Hospital Course: Jerry Mcpherson is a 84 y.o. male with medical history significant of atrial fibrillation on amiodarone , moderate aortic stenosis, ischemic cardiomyopathy with an EF of 30 to 35% with associated diastolic dysfunction, hypertension, chronic leukocytosis secondary to polycythemia vera, hypothyroidism, prior bladder cancer status post cystectomy with a chronic urostomy tube.  Has a history of chronic kidney disease and worsening renal function after diuresis during previous admission in December 2025.  Patient presented to the ED with complaint of worsening shortness of breath, orthopnea and lower extremity edema. Patient also recently had some hematuria and had MRI done by primary care provider.  On presentation vital stable, labs with leukocytosis at 17, 300 which is around his typical range.  Creatinine at 2.14 which is down from 2.9 on the day of prior to discharge.  proBNP elevated at 25,472 and mildly elevated troponin at 37. CXR negative for any acute cardiopulmonary disease.  Patient was admitted for acute on chronic combined systolic  and diastolic heart failure.  Received IV Lasix .  1/8: Vital stable on room air, labs with improving renal function with creatinine at 1.73 today.  Improving leukocytosis at 15.2.  Urine cultures pending.  Giving another dose of IV Lasix . CBC with improving leukocytosis, hemoglobin with some decreased to 11.6, continue to have gross hematuria, recent MRI done by PCP with multiple significant abnormalities-please see the full report Urology was consulted.  Not on ARB or ACE inhibitor secondary to poor renal function as well as history of angioedema Metoprolol  and Norvasc  were held secondary to suboptimal blood pressure readings prior to discharge in December.  Cardiology is planning to add low-dose carvedilol .  Patient would like to continue full scope of medical care, palliative care is on board.  1/9: Hemodynamically stable with worsening of creatinine to 2.63, patient has an history of poor tolerance of diuretics in the past.  Holding further IV diuresis and likely will need low-dose p.o. Lasix  as needed on discharge per cardiology.  They would like to monitor for 1 more day. PT is recommending home health.  Urology is recommending follow-up with his oncologist at [facility].  Urine cultures with Enterobacter cloacae and Pseudomonas aeruginosa.  Continuing cefepime , susceptibility to Pseudomonas is not resulted yet.  1/10: Hemodynamically stable, some improvement in renal function with creatinine now at 2.5 after stopping IV diuresis.  Obtained final urine culture results and based on that he will be getting Bactrim  for 3 more days and his Cipro  for 10 days and need to have a close follow-up with his urologist at oncologist at [facility].  Patient was also given low-dose Lasix  to use as needed for lower extremity edema or worsening shortness of breath.  He will follow-up closely with cardiology as outpatient for further assistance.  Patient agreed to make himself DNR but would like to keep full scope of  medical care.  He was advised multiple times by different providers for palliative/hospice care.  He is currently not ready to go on hospice.  He will continue on current medications and need to have a close follow-up with his providers for further assistance.  Patient will remain high risk for readmission and mortality based on his advanced age and multiple life limiting underlying comorbidities.  Assessment and Plan: * Acute on chronic combined systolic (congestive) and diastolic (congestive) heart failure (HCC) Symptoms concerning for acute on chronic combined heart failure.  Significantly elevated proBNP.  Significant improvement in lower extremity edema with 1 dose of IV Lasix . Cardiology is on board. - Further IV diuresis has been held due to increasing creatinine, creatinine started improving next day.  He was given 20 mg of p.o. Lasix  to use as needed -Continue with low-dose carvedilol   Nonrheumatic aortic valve stenosis Patient with history of aortic stenosis with mean gradient 20 mmHg with a valve area of 1.39 cm with moderate AAS on cardiac catheterization this past December and was deemed not a candidate for intervention. - Follow-up with cardiology  Coronary artery disease of native artery of native heart with stable angina pectoris Patient has known 70% left main lesion as well as mid to distal RCA lesions 35% He was evaluated by interventional cardiology and felt not to be a good candidate either for PCI or CABG. No current chest pain, barely positive troponin with a flat curve secondary to demand ischemia with CHF exacerbation.  Gross hematuria MRI suggestive of focal pyelonephritis on the right ? History of bladder cancer status post cystectomy and urostomy.  Continue to have mild gross hematuria, hemoglobin stable with small improvement today. Urine cultures with Enterobacter cloacae and Pseudomonas aeruginosa, history of multiple UTIs since October with urine culture  growing Klebsiella, Citrobacter, Enterobacter and Pseudomonas UTI all sensitive to cefepime   -Urology is recommending follow-up with his [facility] urologic oncologist Patient received cefepime  while in the hospital and is being discharged on 3 more days of Bactrim  and 10 days of Cipro  based on sensitivity results.  He need to follow-up closely with his urologist for further assistance.  Patient has an history of multiple positive urine cultures, can be a colonization.  Paroxysmal atrial fibrillation (HCC) Currently in NSR -Continue home Eliquis  and amiodarone   History of polycythemia vera Patient with history of polycythemia vera with associated chronic leukocytosis. -CBC seems to be around baseline -Continue home hydroxyurea   Hypertension Blood pressure currently within goal. - Continue Coreg   Chronic kidney disease (CKD), stage III (moderate) (HCC) History of CKD stage IIIb. Slight increase in creatinine to 2.63, history of poor tolerance of diuretic in the past -Holding further IV diuresis -Monitor renal function -Avoid nephrotoxins  Hypothyroidism TSH was 9.61 during most recent prior hospitalization. -Continue  Synthroid -dose was increased to 75 mcg     Pain control - [place]  Controlled Substance Reporting System database was reviewed. and patient was instructed, not to drive, operate heavy machinery, perform activities at heights, swimming or participation in water activities or provide baby-sitting services while on Pain, Sleep and Anxiety Medications; until their outpatient Physician has advised to do so again. Also recommended to not to take more than prescribed Pain, Sleep and Anxiety Medications.  Consultants: Cardiology.  Urology Procedures performed: None Disposition: Home health Diet recommendation:  Cardiac diet DISCHARGE MEDICATION:  Allergies as of 04/21/2024       Reactions   Losartan Potassium Swelling   angioedema Other reaction(s): Angioedema    Shellfish Allergy Swelling, Rash   Rash, itching, swelling Currently eats SHELLFISH without symptoms   Shellfish Protein-containing Drug Products Dermatitis, Swelling   Rash, itching, swelling    Rash, itching, swelling   Currently eats SHELLFISH without symptoms   Doxycycline  Other (See Comments)   Blisters on hand   Ultram  [tramadol Hcl] Nausea And Vomiting   Celecoxib Itching, Swelling, Rash, Other (See Comments)   Cannot take when eating shellfish   Codeine Palpitations   Heart racing   Cyclobenzaprine Other (See Comments)   Musculoskeletal therapy agents cause Excessive sedation Musculoskeletal therapy agents cause excessive sedation   Tramadol Hcl Nausea And Vomiting        Medication List     STOP taking these medications    valACYclovir  1000 MG tablet Commonly known as: VALTREX        TAKE these medications    amiodarone  200 MG tablet Commonly known as: PACERONE  Take 2 tablets by mouth 2 times daily for 3 days, THEN 1 tablet 2 times daily for 7 days, THEN 1 tablet daily. Start taking on: April 02, 2024   aspirin  EC 81 MG tablet Take 81 mg by mouth daily. Swallow whole.   atorvastatin  80 MG tablet Commonly known as: LIPITOR  Take 1 tablet (80 mg total) by mouth daily.   budesonide -formoterol  160-4.5 MCG/ACT inhaler Commonly known as: SYMBICORT  Inhale 2 puffs into the lungs 2 (two) times daily.   carvedilol  3.125 MG tablet Commonly known as: COREG  Take 1 tablet (3.125 mg total) by mouth 2 (two) times daily with a meal.   ciprofloxacin  500 MG tablet Commonly known as: CIPRO  Take 1 tablet (500 mg total) by mouth daily with breakfast for 10 days. Start taking on: April 22, 2024   clopidogrel  75 MG tablet Commonly known as: PLAVIX  Take 75 mg by mouth daily.   Eliquis  2.5 MG Tabs tablet Generic drug: apixaban  Take 1 tablet (2.5 mg total) by mouth 2 (two) times daily.   ferrous sulfate  325 (65 FE) MG tablet Take 325 mg by mouth daily.    furosemide  40 MG tablet Commonly known as: Lasix  Take 1 tablet (40 mg total) by mouth daily as needed for fluid or edema.   hydroxyurea  500 MG capsule Commonly known as: HYDREA  Take 500 mg by mouth daily.   levothyroxine  75 MCG tablet Commonly known as: SYNTHROID  Take 1 tablet (75 mcg total) by mouth daily. What changed:  medication strength how much to take additional instructions   MULTIVITAMIN ADULT PO Take 1 tablet by mouth daily.   ondansetron  4 MG disintegrating tablet Commonly known as: ZOFRAN -ODT Take 1 tablet (4 mg total) by mouth every 8 (eight) hours as needed for nausea or vomiting.   oxyCODONE -acetaminophen  10-325 MG tablet Commonly known as: PERCOCET Take 1 tablet by mouth every 6 (six) hours as needed. for pain   polyethylene glycol powder 17 GM/SCOOP powder Commonly known as: GLYCOLAX /MIRALAX  Take 17 g by mouth daily. Dissolve 1 capful (17g) in 4-8 ounces of liquid and take by mouth daily.   sodium bicarbonate  650 MG tablet Take 2 tablets (1,300 mg total) by mouth 2 (two) times daily.   sulfamethoxazole -trimethoprim  400-80 MG tablet Commonly known as: BACTRIM  Take 1 tablet by mouth every 12 (twelve) hours for 6 doses. Start taking on: April 22, 2024        Contact information for  follow-up providers     [name], MD. Schedule an appointment as soon as possible for a visit in 1 week(s).   Specialty: Family Medicine Contact information: [address] [place] [address] [phone]              Contact information for after-discharge care     Home Medical Care     [facility]) .   Service: Home Health Services Contact information: [phone] [place]  [phone]                    Discharge Exam: Filed Weights   04/20/24 0500 04/21/24 0500  Weight: 62.1 kg 62.6 kg   General.  Frail and hard of hearing elderly man, in no acute  distress. Pulmonary.  Lungs clear bilaterally, normal respiratory effort. CV.  Regular rate and rhythm, no JVD, rub or murmur. Abdomen.  Soft, nontender, nondistended, BS positive. CNS.  Alert and oriented .  No focal neurologic deficit. Extremities.  No edema, pulses intact and symmetrical. Psychiatry.  Judgment and insight appears normal.   Condition at discharge: stable  The results of significant diagnostics from this hospitalization (including imaging, microbiology, ancillary and laboratory) are listed below for reference.   Imaging Studies: DG Chest 2 View Result Date: 04/18/2024 CLINICAL DATA:  Shortness of breath. EXAM: CHEST - 2 VIEW COMPARISON:  03/24/2024, 12/09/2023 and CT 04/07/2024 FINDINGS: Lungs are adequately inflated without focal airspace consolidation or effusion. Evidence of known hiatal hernia. Cardiomediastinal silhouette and remainder of the exam is unchanged. IMPRESSION: 1. No acute cardiopulmonary disease. 2. Known hiatal hernia. Electronically Signed   By: Toribio Agreste M.D.   On: 04/18/2024 12:53   MR Abdomen W Wo Contrast Result Date: 04/17/2024 CLINICAL DATA:  Renal mass/cyst, indeterminate. EXAM: MRI ABDOMEN WITHOUT AND WITH CONTRAST TECHNIQUE: Multiplanar multisequence MR imaging of the abdomen was performed both before and after the administration of intravenous contrast. CONTRAST:  6mL GADAVIST  GADOBUTROL  1 MMOL/ML IV SOLN COMPARISON:  CT scan renal stone protocol from 04/07/2024. FINDINGS: Lower chest: Unremarkable MR appearance to the lung bases. There is tiny right and small left pleural effusion. No pericardial effusion. Normal heart size. Hepatobiliary: The liver is normal in size. Noncirrhotic configuration. There is a multilobulated, 4.5 x 5.8 cm, cystic lesion centered in the central left hepatic lobe, which exhibit fluid-fluid level with dependent T1 hyperintense and supernatant T1 hypointense areas, which may represent a cyst with proteinaceous/hemorrhagic  contents. There is no mural nodularity or abnormal enhancement on the postcontrast images. No suspicious liver lesion. No intrahepatic or extrahepatic bile duct dilatation. No choledocholithiasis. Unremarkable gallbladder. Pancreas: There is mild-to-moderate diffuse atrophy of pancreas. No peripancreatic fat stranding. There are multiple T2 hyperintense structures throughout the pancreas with largest in the pancreatic tail measuring up to 1.1 x 1.9 cm (series 4, image 21). This larger lesion is nonenhancing on the postcontrast images. However, other smaller lesions are not well evaluated on the postcontrast images due to small size. On provided images, no distinct communication of these lesions seen with pancreatic side branch or main duct. However, these are favored to represent pancreatic side-branch IPMN. Main pancreatic duct is not dilated. Spleen: Enlarged measuring upto 7.1 x 14.2 cm orthogonally on coronal plane. No focal mass. Adrenals/Urinary Tract: There is an approximately 11 x 12 mm right adrenal adenoma. Unremarkable left adrenal gland. Bilateral kidneys exhibit at least mild diffuse cortical atrophy. Bilateral extrarenal pelvis noted.  No hydroureteronephrosis. There is probable duplex left renal collecting system. Apparent Elongated bilobed retroperitoneal cyst medial to the left kidney  Described on the prior exam corresponds to extrarenal pelvis. There is an approximately 1.5 x 2.4 cm mixed soft tissue and macroscopic fat attenuation area adjacent to the upper pole of the left kidney, medially, which corresponds to the observation described on the recent CT scan renal stone protocol. There are few sub 5 mm dystrophic calcifications in the lesion, seen on the prior CT scan. This may represent sequela of fat necrosis or exophytic angiomyolipoma. There is a heterogeneous, T2 mix Iso/hypointense and T1 isointense, 2.2 x 3.0 cm heterogeneously hypoenhancing area in the right kidney interpolar region,  posteriorly. The area extends up to the renal cortex. This is favored to represent area of focal pyelonephritis. Clinical correlation and follow-up exam in 3-6 months is recommended to document resolution and exclude underlying neoplastic process. Stomach/Bowel: There is moderate sliding hiatal hernia. Right lower quadrant urostomy noted. Visualized portions within the abdomen are unremarkable. No disproportionate dilation of bowel loops. Vascular/Lymphatic: No pathologically enlarged lymph nodes identified. No abdominal aortic aneurysm demonstrated. No ascites. Other: Urinary bladder is surgically absent, better demonstrated on the prior CT scan. Musculoskeletal: No suspicious bone lesions identified. There is S shaped scoliotic curvature of thoracolumbar spine. IMPRESSION: 1. There is a heterogeneous, 2.2 x 3.0 cm area in the right kidney interpolar region, posteriorly, favored to represent focal pyelonephritis. Clinical correlation and follow-up exam in 3-6 months is recommended to document resolution and exclude underlying neoplastic process. 2. There is an approximately 1.5 x 2.4 cm mixed soft tissue and macroscopic fat attenuation area adjacent to the upper pole of the left kidney, medially, which corresponds to the observation described on the recent CT scan renal stone protocol. This may represent sequela of fat necrosis or exophytic angiomyolipoma. 3. There is a multilobulated, 4.5 x 5.8 cm, cystic lesion centered in the left hepatic lobe, which exhibit fluid-fluid level, which may represent a cyst with proteinaceous/hemorrhagic contents. No mural nodularity or abnormal enhancement on the postcontrast images. 4. There are multiple T2 hyperintense lesions throughout the pancreas with largest in the pancreatic tail measuring up to 1.1 x 1.9 cm. These are favored to represent pancreatic side-branch IPMN. Correlate clinically to determine a follow-up exam in 1-2 years to document stability. 5. There is an  approximately 11 x 12 mm right adrenal adenoma. 6. Other observations, as described above. Electronically Signed   By: Ree Molt M.D.   On: 04/17/2024 15:50   CT Renal Stone Study Result Date: 04/07/2024 EXAM: CT UROGRAM WITHOUT CONTRAST 04/07/2024 07:28:32 PM TECHNIQUE: CT of the abdomen and pelvis was performed without the administration of intravenous contrast as per CT urogram protocol. Multiplanar reformatted images as well as MIP urogram images are provided for review. Automated exposure control, iterative reconstruction, and/or weight based adjustment of the mA/kV was utilized to reduce the radiation dose to as low as reasonably achievable. COMPARISON: No prior CT. Last renal ultrasound 03/23/2024. CLINICAL HISTORY: Hematuria in the patient's urostomy bag for 2 months. Prior cystoprostatectomy with ureteroileal conduit 07/24/2020, with stage 4 bladder cancer with prostate invasion. FINDINGS: LOWER CHEST: Bilateral minimal layering pleural effusions. Heavy calcifications and thickening of the aortic valve leaflets. Follow-up echocardiography suggested to assess the valvular function. . . The coronary arteries are heavily calcified. There is mild cardiomegaly. There is bronchial thickening in the lower lobes with scattered subsegmental bronchial impactions. Asymmetric opacity in the posterior right lung base which could  represent asymmetric atelectasis or pneumonia. There is mild subpleural reticulation in the bases. LIVER: In the left lobe of the liver, occupying segments 2 and 3 is a 5.5 x 4.3 cm grossly uncomplicated lobular cyst, hounsfield density of 19.3. Just below this an 8 mm cyst with hounsfield density of 21. No other focal liver abnormality is seen. GALLBLADDER AND BILE DUCTS: Gallbladder is unremarkable. No biliary ductal dilatation. SPLEEN: Enlarged spleen measuring 15.4 cm in length, similar to the renal ultrasound done 01/16/2020. No splenic mass is seen. PANCREAS: The pancreas is  partially atrophic without focal contour deformity or ductal dilatation. ADRENAL GLANDS: There is ill-defined solid material with calcifications just above the upper pole of the left kidney and separate from the left adrenal gland, measuring 2.5 x 1.1 x 1.6 cm, the significance is indeterminate but the presence of dystrophic calcifications suggests a benign process such as old infection or suprarenal hemorrhage. MRI is recommended, preferably both with and without contrast if renal function permits. . No adrenal mass. KIDNEYS, URETERS AND BLADDER: Prior cystoprostatectomy with ureteroileal conduit 07/24/2020. The bladder and prostate have been removed. No mass is seen in the pelvic floor. There are surgical clips. . The unenhanced kidneys demonstrate scattered cortical nephrolithiasis but no contour deforming mass. There is mild cortical thinning. There is a solitary 1 mm nonobstructing calyceal stone in the inferior pole of the right kidney. Mild right-sided pyelectasis is similar to the recent renal ultrasound, without caliectasis. There is no hydronephrosis on the left. The ureteral conduit is not optimally demonstrated without contrast, but the visualized ureters appear normal in caliber. The output is through the right mid abdominal wall. Symmetric perinephric stranding noted, probably chronic. There is an elongate bilobed retroperitoneal cyst medial to the left kidney measuring 7.1 cm in length, with a maximum AP and transverse axis of 4.3 x 2.9 cm, hounsfield density of 15 to 20. This appears uncomplicated but with noncontrast CT not optimally evaluated. Attention on MRI follow-up recommended. GI AND BOWEL: There is a moderate to large hiatal hernia with about half of the stomach intrathoracic. There is no bowel obstruction or inflammation. There is moderate fecal stasis. An appendix is not seen in this patient. There is diverticulosis in the transverse and left colon without evidence of focal diverticulitis  or colitis. PERITONEUM AND RETROPERITONEUM: No ascites. No free air. VASCULATURE: Aorta is normal in caliber. There is heavy aortoiliac and branch vessel atherosclerosis. No aneurysm. LYMPH NODES: There are mildly prominent bilateral inguinal chain nodes up to 1 cm. . There are enlarged left periaortic chain nodes as well, the largest of these is 2.9 x 2 cm on series 2 axial 33. Slightly prominent lymph nodes overlie the abdominal aorta. No other adenopathy is seen. REPRODUCTIVE ORGANS: Prior cystoprostatectomy . BONES AND SOFT TISSUES: There is reverse S-shaped thoracolumbar scoliosis, osteopenia and degenerative change of the spine. No acute or other significant osseous findings or destructive lesions. No focal soft tissue abnormality. . There is metallic artifact in the abdomen due to overlying suspender clasps. IMPRESSION: 1. Scattered cortical nephrolithiasis and a solitary 1 mm nonobstructing calyceal stone in the inferior pole of the right kidney. 2. Mild right-sided pyelectasis without caliectasis, similar to prior renal ultrasound. 3. Ill-defined solid material with calcifications above the upper pole of the left kidney, significance indeterminate, not part of the left adrenal gland . MRI recommended with and without contrast if renal function permits, without contrast if not . 4. Elongate bilobed retroperitoneal cyst medial to the left kidney,  appears uncomplicated but noncontrast CT limits evaluation. attention on MRI recommended. 5. Enlarged left periaortic chain nodes, the largest measuring 2.9 x 2 cm. Slightly prominent inguinal nodes. No other adenopathy. 6. Left hepatic cysts. 7. Mild splenomegaly appears similar to ultrasound in 2021. 8. Heavy vascular calcification including the aortic valve leaflets. Consider echocardiographic follow-up assessment of valvular function. Cardiomegaly. 9. Scattered subsegmental bronchial impactions in both lower lobes. Asymmetric right posterior basal opacity could  be atelectasis or pneumonia. Minimal pleural effusions. 10. Constipation with diverticulosis. Electronically signed by: Francis Quam MD 04/07/2024 09:05 PM EST RP Workstation: [id number]   PERIPHERAL VASCULAR CATHETERIZATION Result Date: 03/26/2024 See surgical note for result.  DG Chest 1 View Result Date: 03/24/2024 CLINICAL DATA:  Congestive heart failure. EXAM: CHEST  1 VIEW COMPARISON:  03/23/2024 FINDINGS: Stable enlarged cardiac silhouette and tortuous and calcified thoracic aorta. Moderate-sized hiatal hernia. Clear lungs with normal vascularity. Mild chronic interstitial prominence. Moderate thoracic spine degenerative changes and mild scoliosis. IMPRESSION: 1. No acute abnormality. 2. Stable cardiomegaly. 3. Moderate-sized hiatal hernia. Electronically Signed   By: Elspeth Bathe M.D.   On: 03/24/2024 11:28   US  RENAL Result Date: 03/23/2024 EXAM: US  Retroperitoneum Complete, Renal. 03/23/2024 05:51:35 PM TECHNIQUE: Real-time ultrasonography of the retroperitoneum renal was performed. COMPARISON: None available CLINICAL HISTORY: AKI (acute kidney injury). FINDINGS: FINDINGS: RIGHT KIDNEY/URETER: Right kidney measures 10.4 x 5.3 x 4.8 cm (138 mL). Normal cortical echogenicity. Mild fullness of the right renal collecting system without frank hydronephrosis. No calculus. No mass. LEFT KIDNEY/URETER: Left kidney measures 7.0 x 4.2 x 3.5 cm (55 mL). Normal cortical echogenicity. Mild fullness of the left renal collecting system without frank hydronephrosis. No calculus. No mass. BLADDER: Status post cystectomy. IMPRESSION: 1. Mild fullness of the bilateral renal collecting systems without frank hydronephrosis. 2. Status post cystectomy. Electronically signed by: Pinkie Pebbles MD 03/23/2024 09:32 PM EST RP Workstation: [id number]   DG Chest 1 View Result Date: 03/23/2024 EXAM: 1 VIEW(S) XRAY OF THE CHEST 03/23/2024 03:34:00 PM COMPARISON: 12/09/2023 CLINICAL HISTORY: CHF (congestive heart  failure) (HCC) FINDINGS: LUNGS AND PLEURA: No focal pulmonary opacity. No pleural effusion. No pneumothorax. HEART AND MEDIASTINUM: Cardiomegaly. Tortuous thoracic aorta. Atherosclerotic calcifications. BONES AND SOFT TISSUES: Scoliosis of thoracolumbar spine. IMPRESSION: 1. Cardiomegaly with tortuous thoracic aorta and atherosclerotic calcifications. 2. Scoliosis of the thoracolumbar spine. Electronically signed by: Franky Stanford MD 03/23/2024 09:16 PM EST RP Workstation: [id number]   CARDIAC CATHETERIZATION Result Date: 03/23/2024   Ost LM to Mid LM lesion is 70% stenosed.   Mid RCA to Dist RCA lesion is 35% stenosed.   LV end diastolic pressure is normal.   There is moderate aortic valve stenosis. Conclusions: Severe left main coronary artery disease with 70% proximal stenosis.  Otherwise, mild-moderate coronary artery disease. Normal left and right heart filling pressures. Moderately reduced Fick cardiac output/index (CO 3.1 L/min, CI 1.8 L/min/m). Moderate aortic valve stenosis (peak-to-peak gradient 22 mmHg).  Recommendations: Admit for optimization of heart failure and further evaluation of suspected critical limb ischemia involving the left foot and worsening leukocytosis. May need to consider transfer to [facility] for consideration of LMCA stenting, though given the patient's comorbidities, palliative medical therapy will also need to be considered.  I do not think he is a candidate for CABG. Gentle postcatheterization hydration in the setting of low normal filling pressures and CKD stage IV. Lonni Hanson, MD [facility]   Microbiology: Results for orders placed or performed during the hospital encounter of 04/18/24  Urine Culture (for  pregnant, neutropenic or urologic patients or patients with an indwelling urinary catheter)     Status: Abnormal   Collection Time: 04/18/24  3:28 PM   Specimen: Urine, Clean Catch  Result Value Ref Range Status   Specimen Description   Final    URINE,  CLEAN CATCH Performed at [facility], [address] [place], [place] [address]    Special Requests   Final    NONE Performed at [facility], [address] [place], [place] [address]    Culture (A)  Final    >=100,000 COLONIES/mL ENTEROBACTER CLOACAE >=100,000 COLONIES/mL PSEUDOMONAS AERUGINOSA    Report Status 04/21/2024 FINAL  Final   Organism ID, Bacteria ENTEROBACTER CLOACAE (A)  Final   Organism ID, Bacteria PSEUDOMONAS AERUGINOSA (A)  Final      Susceptibility   Enterobacter cloacae - MIC*    CEFEPIME  1 SENSITIVE Sensitive     CIPROFLOXACIN  0.5 INTERMEDIATE Intermediate     GENTAMICIN <=1 SENSITIVE Sensitive     NITROFURANTOIN 64 INTERMEDIATE Intermediate     TRIMETH /SULFA  <=20 SENSITIVE Sensitive     PIP/TAZO Value in next row Resistant      >=128 RESISTANTThis is a modified FDA-approved test that has been validated and its performance characteristics determined by the reporting laboratory.  This laboratory is certified under the Clinical Laboratory Improvement Amendments CLIA as qualified to perform high complexity clinical laboratory testing.    MEROPENEM Value in next row Sensitive      >=128 RESISTANTThis is a modified FDA-approved test that has been validated and its performance characteristics determined by the reporting laboratory.  This laboratory is certified under the Clinical Laboratory Improvement Amendments CLIA as qualified to perform high complexity clinical laboratory testing.    * >=100,000 COLONIES/mL ENTEROBACTER CLOACAE   Pseudomonas aeruginosa - MIC*    MEROPENEM Value in next row Sensitive      >=128 RESISTANTThis is a modified FDA-approved test that has been validated and its performance characteristics determined by the reporting laboratory.  This laboratory is certified under the Clinical Laboratory Improvement Amendments CLIA as qualified to perform high complexity clinical laboratory testing.    CIPROFLOXACIN  Value in next row  Sensitive      >=128 RESISTANTThis is a modified FDA-approved test that has been validated and its performance characteristics determined by the reporting laboratory.  This laboratory is certified under the Clinical Laboratory Improvement Amendments CLIA as qualified to perform high complexity clinical laboratory testing.    IMIPENEM Value in next row Sensitive      >=128 RESISTANTThis is a modified FDA-approved test that has been validated and its performance characteristics determined by the reporting laboratory.  This laboratory is certified under the Clinical Laboratory Improvement Amendments CLIA as qualified to perform high complexity clinical laboratory testing.    PIP/TAZO Value in next row Sensitive      <=4 SENSITIVEThis is a modified FDA-approved test that has been validated and its performance characteristics determined by the reporting laboratory.  This laboratory is certified under the Clinical Laboratory Improvement Amendments CLIA as qualified to perform high complexity clinical laboratory testing.    CEFTAZIDIME/AVIBACTAM Value in next row Sensitive      <=4 SENSITIVEThis is a modified FDA-approved test that has been validated and its performance characteristics determined by the reporting laboratory.  This laboratory is certified under the Clinical Laboratory Improvement Amendments CLIA as qualified to perform high complexity clinical laboratory testing.    CEFTOLOZANE/TAZOBACTAM Value in next row Resistant      <=  4 SENSITIVEThis is a modified FDA-approved test that has been validated and its performance characteristics determined by the reporting laboratory.  This laboratory is certified under the Clinical Laboratory Improvement Amendments CLIA as qualified to perform high complexity clinical laboratory testing.    TOBRAMYCIN Value in next row Sensitive      <=4 SENSITIVEThis is a modified FDA-approved test that has been validated and its performance characteristics determined by the  reporting laboratory.  This laboratory is certified under the Clinical Laboratory Improvement Amendments CLIA as qualified to perform high complexity clinical laboratory testing.    CEFTAZIDIME Value in next row Sensitive      <=4 SENSITIVEThis is a modified FDA-approved test that has been validated and its performance characteristics determined by the reporting laboratory.  This laboratory is certified under the Clinical Laboratory Improvement Amendments CLIA as qualified to perform high complexity clinical laboratory testing.    * >=100,000 COLONIES/mL PSEUDOMONAS AERUGINOSA    Labs: CBC: Recent Labs  Lab 04/18/24 1141 04/19/24 0544 04/20/24 0355 04/21/24 0508  WBC 17.3* 15.2* 15.0* 14.9*  HGB 12.2* 11.6* 12.8* 12.1*  HCT 41.1 39.4 42.3 39.6  MCV 89.3 89.1 87.0 87.2  PLT 551* 504* 532* 475*   Basic Metabolic Panel: Recent Labs  Lab 04/18/24 1141 04/19/24 0544 04/20/24 0355 04/21/24 0508  NA 141 143 140 141  K 4.5 3.8 4.3 4.0  CL 107 112* 100 105  CO2 23 22 27 25   GLUCOSE 174* 79 133* 100*  BUN 40* 31* 44* 49*  CREATININE 2.14* 1.73* 2.65* 2.50*  CALCIUM  8.8* 6.9* 8.3* 8.6*   Liver Function Tests: No results for input(s): AST, ALT, ALKPHOS, BILITOT, PROT, ALBUMIN in the last 168 hours. CBG: No results for input(s): GLUCAP in the last 168 hours.  Discharge time spent: greater than 30 minutes.  This record has been created using Conservation officer, historic buildings. Errors have been sought and corrected,but may not always be located. Such creation errors do not reflect on the standard of care.   Signed: Amaryllis Dare, MD [facility] 04/21/2024 "

## 2024-04-21 NOTE — Progress Notes (Signed)
 "  Rounding Note   Patient Name: Jerry Mcpherson Date of Encounter: 04/21/2024  [facility] Cardiologist: None   Subjective Feeling better.  Breathing improved.  Eager to go home.   Scheduled Meds:  amiodarone   200 mg Oral Daily   apixaban   2.5 mg Oral BID   atorvastatin   80 mg Oral Daily   carvedilol   3.125 mg Oral BID WC   [START ON 04/22/2024] ciprofloxacin   500 mg Oral Q breakfast   clopidogrel   75 mg Oral Daily   ferrous sulfate   325 mg Oral Daily   fluticasone  furoate-vilanterol  1 puff Inhalation Daily   hydroxyurea   500 mg Oral Daily   levothyroxine   75 mcg Oral Daily   multivitamin with minerals  1 tablet Oral Daily   polyethylene glycol powder  17 g Oral Daily   sodium bicarbonate   1,300 mg Oral BID   sodium chloride  flush  3 mL Intravenous Q12H   [START ON 04/22/2024] sulfamethoxazole -trimethoprim   1 tablet Oral Q12H   Continuous Infusions:  sodium chloride  Stopped (04/21/24 1026)   PRN Meds: acetaminophen  **OR** acetaminophen , ondansetron  **OR** ondansetron  (ZOFRAN ) IV, oxyCODONE -acetaminophen  **AND** oxyCODONE    Vital Signs  Vitals:   04/21/24 0018 04/21/24 0445 04/21/24 0500 04/21/24 0901  BP: 123/65 123/73  123/78  Pulse: 77 70  69  Resp: 18 18    Temp: 97.6 F (36.4 C) (!) 97.5 F (36.4 C)  97.8 F (36.6 C)  TempSrc:      SpO2: 97% 99%  98%  Weight:   62.6 kg     Intake/Output Summary (Last 24 hours) at 04/21/2024 1106 Last data filed at 04/21/2024 1039 Gross per 24 hour  Intake 1031.56 ml  Output 1250 ml  Net -218.44 ml      04/21/2024    5:00 AM 04/20/2024    5:00 AM 04/09/2024    9:43 AM  Last 3 Weights  Weight (lbs) 138 lb 0.1 oz 136 lb 14.5 oz 148 lb  Weight (kg) 62.6 kg 62.1 kg 67.132 kg      Telemetry Sinus rhythm.  No events.   - Personally Reviewed  ECG  N/a - Personally Reviewed  Physical Exam  VS:  BP 123/78   Pulse 69   Temp 97.8 F (36.6 C)   Resp 18   Wt 62.6 kg   SpO2 98%   BMI 19.80 kg/m  , BMI  Body mass index is 19.8 kg/m. GENERAL:  Well appearing HEENT: Pupils equal round and reactive, fundi not visualized, oral mucosa unremarkable NECK:  No jugular venous distention, waveform within normal limits, carotid upstroke brisk and symmetric, no bruits, no thyromegaly LUNGS:  Clear to auscultation bilaterally HEART:  RRR.  PMI not displaced or sustained,S1 and S2 within normal limits, no S3, no S4, no clicks, no rubs, III/VI systolic murmur at the LUSB ABD:  Flat, positive bowel sounds normal in frequency in pitch, no bruits, no rebound, no guarding, no midline pulsatile mass, no hepatomegaly, no splenomegaly EXT: Warm. No edema, no cyanosis no clubbing SKIN:  No rashes no nodules NEURO:  Cranial nerves II through XII grossly intact, motor grossly intact throughout PSYCH:  Cognitively intact, oriented to person place and time   Labs High Sensitivity Troponin:  No results for input(s): TROPONINIHS in the last 720 hours.  Recent Labs  Lab 04/18/24 1141 04/18/24 1528  TRNPT 37* 35*       Chemistry Recent Labs  Lab 04/19/24 0544 04/20/24 0355 04/21/24 0508  NA  143 140 141  K 3.8 4.3 4.0  CL 112* 100 105  CO2 22 27 25   GLUCOSE 79 133* 100*  BUN 31* 44* 49*  CREATININE 1.73* 2.65* 2.50*  CALCIUM  6.9* 8.3* 8.6*  GFRNONAA 38* 23* 25*  ANIONGAP 9 13 10     Lipids No results for input(s): CHOL, TRIG, HDL, LABVLDL, LDLCALC, CHOLHDL in the last 168 hours.  Hematology Recent Labs  Lab 04/19/24 0544 04/20/24 0355 04/21/24 0508  WBC 15.2* 15.0* 14.9*  RBC 4.42 4.86 4.54  HGB 11.6* 12.8* 12.1*  HCT 39.4 42.3 39.6  MCV 89.1 87.0 87.2  MCH 26.2 26.3 26.7  MCHC 29.4* 30.3 30.6  RDW 25.7* 25.2* 24.9*  PLT 504* 532* 475*   Thyroid  No results for input(s): TSH, FREET4 in the last 168 hours.  BNP Recent Labs  Lab 04/18/24 1141  PROBNP 25,472.0*    DDimer No results for input(s): DDIMER in the last 168 hours.   Radiology  No results found.  Cardiac  Studies Echo 02/03/24:  1. Left ventricular ejection fraction, by estimation, is 30 to 35%. The  left ventricle has moderately decreased function. Left ventricular  endocardial border not optimally defined to evaluate regional wall motion.  There is mild concentric left  ventricular hypertrophy. Left ventricular diastolic parameters are  consistent with Grade I diastolic dysfunction (impaired relaxation). The  average left ventricular global longitudinal strain is -8.4 %. The global  longitudinal strain is abnormal.   2. Right ventricular systolic function is normal. The right ventricular  size is normal.   3. Mild to moderate mitral valve regurgitation. No evidence of mitral  stenosis.   4. There is severe calcifcation of the aortic valve. Aortic valve  regurgitation is trivial. Moderate low-flow, low-gradient stenosis. Aortic  valve mean gradient measures 11.0 mmHg. Aortic valve Vmax measures 2.29  m/s. AVA 1.49, DVI 0.36. '  LHC/RHC 03/2024:   Ost LM to Mid LM lesion is 70% stenosed.   Mid RCA to Dist RCA lesion is 35% stenosed.   LV end diastolic pressure is normal.   There is moderate aortic valve stenosis.   Conclusions: Severe left main coronary artery disease with 70% proximal stenosis.  Otherwise, mild-moderate coronary artery disease. Normal left and right heart filling pressures. Moderately reduced Fick cardiac output/index (CO 3.1 L/min, CI 1.8 L/min/m). Moderate aortic valve stenosis (peak-to-peak gradient 22 mmHg).   Recommendations: Admit for optimization of heart failure and further evaluation of suspected critical limb ischemia involving the left foot and worsening leukocytosis. May need to consider transfer to [facility] for consideration of LMCA stenting, though given the patient's comorbidities, palliative medical therapy will also need to be considered.  I do not think he is a candidate for CABG. Gentle postcatheterization hydration in the setting of low normal  filling pressures and CKD stage IV.    Patient Profile   84 y.o. 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  Assessment & Plan   # Acute on chronic HFrEF: Echo 03/2024 LVEF 30-35%.  He underwent cardiac catheterization was found to have normal left and right heart pressures and a moderately reduced cardiac index.  BNP was 25,000 on admission in the setting of renal dysfunction.  Symptoms improved with IV Lasix , this worsened his renal function further.  Further diuresis was held.  Carvedilol  was added and he will be given Lasix  to take as needed as an outpatient for shortness of breath or swelling.  He appears to be euvolemic on exam and is feeling well.   # PAF: Currently maintaining sinus rhythm.  Continue Eliquis , amiodarone , and added carvedilol  as above.  # CAD:  # Hyperlipidemia:  Known left main stenosis.  He was not thought to be a good candidate for CABG or PCI.  Continue medical management with clopidogrel , Eliquis , carvedilol  and atorvastatin .  # Moderate aortic stenosis:  DVI 0.36, consistent with moderate AS.  Continue prn lasix .  No intervention indicated.   # CKD 4:  Careful diuresis. Avoid nephrotoxic agents.   # Pyelonephritis:  Management per primary team and nephrology.    [facility] will sign off.   The patient is ready for discharge today from a cardiac standpoint. Medication Recommendations:  lasix  40mg  as needed for shortness of breath or edema. Other recommendations (labs, testing, etc):   Follow up as an outpatient:   05/03/24 HF clinic For questions or updates, please contact [facility] Please consult [email or web address] for contact info under       Signed, Annabella Scarce, MD  04/21/2024, 11:06 AM    "

## 2024-04-21 NOTE — Plan of Care (Signed)

## 2024-04-23 ENCOUNTER — Telehealth: Payer: Self-pay

## 2024-04-23 NOTE — Telephone Encounter (Signed)
 Please schedule appointment to follow up on hematuria as per message below.

## 2024-04-23 NOTE — Progress Notes (Unsigned)
" ° °  04/23/2024 3:50 PM   Corneluis Allston Diltz May 06, 1940 [id number]   HPI: 84 y.o. male here for initial evaluation of MRI findings re: kidney    PMH: Past Medical History:  Diagnosis Date   Acute deep vein thrombosis (DVT) of right femoral vein (HCC) 10/28/2020   US  10/24/2020 [facility] Acute right lower extremity DVT extending from the common femoral vein to  the popliteal vein.     Bladder cancer (HCC)    Genital herpes    Hyperglycemia    Hypertension    Squamous cell carcinoma     Surgical History: Past Surgical History:  Procedure Laterality Date   CATARACT EXTRACTION Left 10/2012   Dr. Lethea; [facility]   CYSTECTOMY W/ URETEROILEAL CONDUIT  07/24/2020   [facility] for Stage IV bladder cancer extending to prostate   EYE SURGERY Left    removed a piece of steel   FEMORAL ARTERY STENT Left 06/16/2022   SFA stenting at [facility]   HERNIA REPAIR  12/08/2006   umbilical hernia, incarderated; Dr. Dessa   LOWER EXTREMITY ANGIOGRAPHY Left 03/26/2024   Procedure: Lower Extremity Angiography;  Surgeon: Marea Selinda RAMAN, MD;  Location: [facility] INVASIVE CV LAB;  Service: Cardiovascular;  Laterality: Left;   PROSTATECTOMY  4/14/2-22   [facility] for Stage IV bladder cancer extending to prostate   RIGHT/LEFT HEART CATH AND CORONARY ANGIOGRAPHY Bilateral 03/23/2024   Procedure: RIGHT/LEFT HEART CATH AND CORONARY ANGIOGRAPHY;  Surgeon: Mady Bruckner, MD;  Location: [facility] INVASIVE CV LAB;  Service: Cardiovascular;  Laterality: Bilateral;   ROTATOR CUFF REPAIR     SQUAMOUS CELL CARCINOMA EXCISION  2008   TONSILLECTOMY      Family History: Family History  Problem Relation Age of Onset   Hypertension Mother    Heart attack Mother    Stroke Father    Cancer Father    COPD Sister    Congestive Heart Failure Brother     Social History:  reports that he quit smoking about 56 years ago. His smoking use included cigarettes. He started smoking about 71 years ago. He has a 45 pack-year smoking  history. He has never used smokeless tobacco. He reports that he does not drink alcohol and does not use drugs.      Physical Exam: There were no vitals taken for this visit.   Constitutional:  Alert and oriented, No acute distress. Cardiovascular: No clubbing, cyanosis, or edema. Respiratory: Normal respiratory effort, no increased work of breathing. GI: Nondistended GU: *** Skin: No rashes, bruises or suspicious lesions. Neurologic: Grossly intact, no focal deficits, moving all 4 extremities. Psychiatric: Normal mood and affect.  Laboratory Data: ***   Pertinent Imaging: I have personally viewed and interpreted the ***.    Assessment & Plan:    There are no diagnoses linked to this encounter.    Penne Skye, MD 04/23/2024  [facility] [address], [address] [place], [place] [address] [phone] "

## 2024-04-23 NOTE — Telephone Encounter (Signed)
 Patient states he is still having blood in his urine and he doesn't feel like anything done while in the hospital is helping him.  However, the patient has not yet started the antibiotics sent in for him yesterday.  His states he didn't  know that is what they were. He is going to check his meds at home and call back to let us  know what they are and will make hosp f/u appointment     Copied from CRM [id number]. Topic: Referral - Status >> Apr 23, 2024 10:36 AM Darshell M wrote: Reason for CRM: Patient calling in for referral, had difficulty hearing. Request Dr. Gasper provide a referral to [facility]. Patient in hospital last week, found to have blood in kidneys. Patient refused to schedule appointment. Request referral asap. Patient CB# [phone]

## 2024-04-23 NOTE — Telephone Encounter (Signed)
 He had severe urinary tract infection which could cause the blood in his urine. He needs follow up appointment here so we can recheck the urine.

## 2024-04-23 NOTE — Transitions of Care (Post Inpatient/ED Visit) (Signed)
 "  04/23/2024  Name: Jerry Mcpherson MRN: [id number] DOB: 1940-10-15  Today's TOC FU Call Status: Today's TOC FU Call Status:: Successful TOC FU Call Completed TOC FU Call Complete Date: 04/23/24  Patient's Name and Date of Birth confirmed. 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 How have you been since you were released from the hospital?: Same Any questions or concerns?: Yes Patient Questions/Concerns:: Waiting on my doctors office to get back to me about my medications, I'm not at home and I don't know what antibiotic they were talking about Patient Questions/Concerns Addressed: Other: (Patient adamant in following up with triage nurse who made appointment for tomorrow)  Items Reviewed: Did you receive and understand the discharge instructions provided?:  (I got the discharge papers but I guess I didn't know about the antibiotics I went to [facility] pharmacy and picked them up but I don't have them with me right now so I can't tell you what they are, I'm not at home but I will call [facility] back) Medications obtained,verified, and reconciled?: No Medications Not Reviewed Reasons::  (Patient has appointment with PCP office and encouraged to take his DC (AVS) instructions with him AND the medications, verbalized understanding) Any new allergies since your discharge?: No Dietary orders reviewed?: NA Type of Diet Ordered:: Heart Healthy Do you have support at home?: Yes People in Home [RPT]: other relative(s) Name of Support/Comfort Primary Source: Nephew is next door and I don't bother him but if I needed him there's no problem  Medications Reviewed Today:  Declines review,stating that he is now with his medications and will  check when he gets back home.  Explained the two antibiotics he's supposed to take and reviewed instructions regarding Lasix . Patient encouraged to keep PCP follow up for tomorrow, verbalized understanding to see PCP. Medications Reviewed Today   Medications were not reviewed in this encounter     Home Care and Equipment/Supplies: Were Home Health Services Ordered?: Yes Name of Home Health Agency:: [facility] EMR reviewed for Home Health Orders: Orders present/patient has not received call (refer to CM for follow-up) Any new equipment or medical supplies ordered?: No  Functional Questionnaire: Do you need assistance with bathing/showering or dressing?: No Do you need assistance with meal preparation?: No Do you need assistance with eating?: No Do you have difficulty maintaining continence: No Do you need assistance with getting out of bed/getting out of a chair/moving?: No Do you have difficulty managing or taking your medications?: No  Follow up appointments reviewed: PCP Follow-up appointment confirmed?: Yes Date of PCP follow-up appointment?: 04/24/24 Follow-up Provider: Isaiah Pepper, MD (Patient was going to cancel but urged to keep appointment to confirm medications and to get referral to [facility]) Specialist [facility] Follow-up appointment confirmed?: No Reason Specialist Follow-Up Not Confirmed:  (Patient to follow up with PCP office on 04/24/24 to have referral sent) Do you need transportation to your follow-up appointment?: No Do you understand care options if your condition(s) worsen?: Yes-patient verbalized understanding  SDOH Interventions Today    Flowsheet Row Most Recent Value  SDOH Interventions   Food Insecurity Interventions Intervention Not Indicated  [Patient states he's out and about, has his transportation now]  Housing Interventions Intervention Not Indicated  Transportation Interventions Intervention Not Indicated  [I have my car back]  Utilities  Interventions Intervention  Not Indicated   Attempts to provide Education for self-mgmt of HF and UTI  during this outreach regarding: -s/s of worsening condition and when to seek medical attention, patient expresses waiting to call his triage nurse back when he gets back home about the medications and urology referral -importance of completing all post discharge hospital hospital follow up appts -adherence to med regimen [facility] TOC 30-day program enrollment reviewed and discussed with pt/caregiver. They have declined enrollment in 30 day TOC program due to:  I will call you if I need anything else after I talk back with my triage nurse at [facility].  Richerd Fish, RN, BSN, CCM [facility], [facility] Management Coordinator Direct Dial: [phone]        "

## 2024-04-24 ENCOUNTER — Ambulatory Visit

## 2024-04-24 VITALS — BP 105/63 | HR 77 | Temp 98.7°F

## 2024-04-24 DIAGNOSIS — Z9350 Unspecified cystostomy status: Secondary | ICD-10-CM

## 2024-04-24 DIAGNOSIS — N12 Tubulo-interstitial nephritis, not specified as acute or chronic: Secondary | ICD-10-CM

## 2024-04-24 DIAGNOSIS — N184 Chronic kidney disease, stage 4 (severe): Secondary | ICD-10-CM

## 2024-04-24 DIAGNOSIS — Z8551 Personal history of malignant neoplasm of bladder: Secondary | ICD-10-CM

## 2024-04-24 DIAGNOSIS — I5022 Chronic systolic (congestive) heart failure: Secondary | ICD-10-CM

## 2024-04-24 DIAGNOSIS — R310 Gross hematuria: Secondary | ICD-10-CM

## 2024-04-24 NOTE — Progress Notes (Signed)
 "    Hospital Follow up Visit  Patient was discharged from the hospital on 04/21/24. This visit occurred with 7 days of discharge.  A clinical staff member has communicated by phone with the patient on 04/23/24 as part of transitions of care management.  Medication reconciliation and management was performed today as noted below.  Ongoing care coordination will continue post- discharge.  Patient: Jerry Mcpherson   DOB: 1940-08-25   84 y.o. Male  MRN: [id number] Visit Date: 04/24/2024  Today's healthcare provider: Isaiah DELENA Pepper, MD   Chief Complaint  Patient presents with   Referral    [facility] Urine is purple in color    Subjective    HPI  Discussed the use of AI scribe software for clinical note transcription with the patient, who gave verbal consent to proceed.  History of Present Illness Jerry Mcpherson is an 84 year old male who presents after recent hospitalization for pyelonephritis and CHFe.  He has been experiencing hematuria, which has persisted despite being on antibiotics. During a recent hospitalization, multiple tests including x-rays, EKG, and an MRI were conducted. He was told to follow up with [facility] urology oncology and requires a referral.  He reports that his shortness of breath has improved and he has been taking lasix  40mg  daily. No leg swelling noted.  He is currently on two antibiotics (cipro  and Bactrim ), which were adjusted during his hospital stay. He is also taking furosemide  as needed for fluid retention, particularly if his legs swell.  His kidney function numbers had dropped before leaving the hospital. He is also on medications including amiodarone , Eliquis , atorvastatin , plavix , hydrea , sodium bicarbonate , carvedilol , synthroid , lasix  as needed, aspirin , and iron. He does not currently use inhalers.   Medications: Show/hide medication list[1]  Review of Systems as noted in HPI.      Objective    BP 105/63   Pulse 77   Temp  98.7 F (37.1 C) (Oral)   SpO2 96%    Physical Exam Constitutional:      Appearance: Normal appearance.  HENT:     Head: Normocephalic and atraumatic.     Mouth/Throat:     Mouth: Mucous membranes are moist.  Eyes:     Pupils: Pupils are equal, round, and reactive to light.  Pulmonary:     Effort: Pulmonary effort is normal.  Genitourinary:    Comments: Urostomy bag in place with gross hematuria. Musculoskeletal:     Right lower leg: No edema.     Left lower leg: No edema.  Skin:    General: Skin is warm.  Neurological:     General: No focal deficit present.     Mental Status: He is alert.      No results found for any visits on 04/24/24.  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 Given IV antibiotics during hospitalization and transitioned to Bactrim  and ciprofloxacin  on discharge.  Patient still has gross hematuria on exam today. Recommend patient finish antibiotics and follow up with [facility] urologist for further evaluation. - Continue current antibiotics as prescribed. - Referral placed for [facility] - Rechecked blood counts and kidney function with blood work today.  Chronic HFrEF [facility]) Patient hospitalized for CHFe, received IV lasix  with improvement in dyspnea. Euvolemic on exam today. Recommend patient continue lasix  40mg   PRN for fluid retention.  CKD stage 4 (HCC) History of poor tolerance of diuretic. Last GFR of 25 on day of discharge. Will repeat BMP today. Follow up with nephrology.  History of bladder cancer Status post cystectomy with a chronic urostomy tube. Urology evaluated patient during admission and recommended that he see [facility] urologic oncology. Referral placed.    Medication reconciliation and management performed today.   No follow-ups on file.       Isaiah DELENA Pepper, MD  [facility] [phone] (phone) [phone] (fax)     [1]  Outpatient Medications Prior to Visit  Medication Sig   amiodarone  (PACERONE ) 200 MG tablet Take 2 tablets by mouth 2 times daily for 3 days, THEN 1 tablet 2 times daily for 7 days, THEN 1 tablet daily.   apixaban  (ELIQUIS ) 2.5 MG TABS tablet Take 1 tablet (2.5 mg total) by mouth 2 (two) times daily.   aspirin  EC 81 MG tablet Take 81 mg by mouth daily. Swallow whole.   atorvastatin  (LIPITOR ) 80 MG tablet Take 1 tablet (80 mg total) by mouth daily.   carvedilol  (COREG ) 3.125 MG tablet Take 1 tablet (3.125 mg total) by mouth 2 (two) times daily with a meal.   ciprofloxacin  (CIPRO ) 500 MG tablet Take 1 tablet (500 mg total) by mouth daily with breakfast for 10 days.   clopidogrel  (PLAVIX ) 75 MG tablet Take 75 mg by mouth daily.   ferrous sulfate  325 (65 FE) MG tablet Take 325 mg by mouth daily.   furosemide  (LASIX ) 40 MG tablet Take 1 tablet (40 mg total) by mouth daily as needed for fluid or edema. (Patient taking differently: Take 20 mg by mouth as needed for fluid or edema.)   hydroxyurea  (HYDREA ) 500 MG capsule Take 500 mg by mouth daily.   levothyroxine  (SYNTHROID ) 75 MCG tablet Take 1 tablet (75 mcg total) by mouth daily.   sodium bicarbonate  650 MG tablet Take 2 tablets (1,300 mg total) by mouth 2 (two) times daily.   sulfamethoxazole -trimethoprim  (BACTRIM ) 400-80 MG tablet Take 1 tablet by mouth every 12 (twelve) hours  for 6 doses.   Multiple Vitamins-Minerals (MULTIVITAMIN ADULT PO) Take 1 tablet by mouth daily.   polyethylene glycol powder (GLYCOLAX /MIRALAX ) 17 GM/SCOOP powder Take 17 g by mouth daily. Dissolve 1 capful (17g) in 4-8 ounces of liquid and take by mouth daily.   [DISCONTINUED] budesonide -formoterol  (SYMBICORT ) 160-4.5 MCG/ACT inhaler Inhale 2 puffs into the lungs 2 (two) times daily.   [DISCONTINUED] ondansetron  (ZOFRAN -ODT) 4 MG disintegrating tablet Take 1 tablet (4 mg total) by mouth every 8 (eight) hours as needed for nausea or vomiting.   [DISCONTINUED] oxyCODONE -acetaminophen  (PERCOCET) 10-325 MG tablet Take 1 tablet by mouth every 6 (six) hours as needed. for pain   No facility-administered medications prior to visit.   "

## 2024-04-25 ENCOUNTER — Ambulatory Visit: Payer: Self-pay

## 2024-04-25 LAB — BASIC METABOLIC PANEL WITH GFR
BUN/Creatinine Ratio: 18 (ref 10–24)
BUN: 53 mg/dL — ABNORMAL HIGH (ref 8–27)
CO2: 22 mmol/L (ref 20–29)
Calcium: 9.1 mg/dL (ref 8.6–10.2)
Chloride: 100 mmol/L (ref 96–106)
Creatinine, Ser: 2.94 mg/dL — ABNORMAL HIGH (ref 0.76–1.27)
Glucose: 108 mg/dL — ABNORMAL HIGH (ref 70–99)
Potassium: 4.7 mmol/L (ref 3.5–5.2)
Sodium: 138 mmol/L (ref 134–144)
eGFR: 20 mL/min/1.73 — ABNORMAL LOW

## 2024-04-25 LAB — CBC
Hematocrit: 45.8 % (ref 37.5–51.0)
Hemoglobin: 14.1 g/dL (ref 13.0–17.7)
MCH: 26.7 pg (ref 26.6–33.0)
MCHC: 30.8 g/dL — ABNORMAL LOW (ref 31.5–35.7)
MCV: 87 fL (ref 79–97)
Platelets: 624 x10E3/uL — ABNORMAL HIGH (ref 150–450)
RBC: 5.29 x10E6/uL (ref 4.14–5.80)
RDW: 22.9 % — ABNORMAL HIGH (ref 11.6–15.4)
WBC: 20.3 x10E3/uL (ref 3.4–10.8)

## 2024-04-25 NOTE — Telephone Encounter (Signed)
 He needs to be seen sooner, there are [facility] slots available next week.

## 2024-04-25 NOTE — Telephone Encounter (Signed)
 Patient advised.

## 2024-04-25 NOTE — Telephone Encounter (Signed)
 Tried calling patient. NA/voicemail not set up yet. If patient calls okay for E2C2 to schedule patient on SameDay slots per Dr.Fisher.

## 2024-04-25 NOTE — Telephone Encounter (Signed)
 Just an FYI-Patient was seen yesterday by Dr.Carter. Patient on his way to [facility].

## 2024-04-27 ENCOUNTER — Ambulatory Visit: Admitting: Urology

## 2024-04-29 ENCOUNTER — Other Ambulatory Visit: Payer: Self-pay | Admitting: Family Medicine

## 2024-04-29 DIAGNOSIS — E039 Hypothyroidism, unspecified: Secondary | ICD-10-CM

## 2024-04-30 ENCOUNTER — Telehealth: Payer: Self-pay | Admitting: Family Medicine

## 2024-04-30 ENCOUNTER — Telehealth: Payer: Self-pay

## 2024-04-30 DIAGNOSIS — M5410 Radiculopathy, site unspecified: Secondary | ICD-10-CM

## 2024-04-30 DIAGNOSIS — G589 Mononeuropathy, unspecified: Secondary | ICD-10-CM

## 2024-04-30 NOTE — Telephone Encounter (Signed)
 Copied from CRM [id number]. Topic: Clinical - Request for Lab/Test Order >> Apr 30, 2024  9:51 AM Myrick T wrote: Reason for CRM: Shona from [facility] called to see if provider would sign off on orders for Skilled Nursing, OT and PT. Please f/u with Shona at [phone]

## 2024-04-30 NOTE — Telephone Encounter (Signed)
 Copied from CRM [id number]. Topic: General - Other >> Apr 30, 2024  9:48 AM Joesph NOVAK wrote: Reason for CRM: patient states he went to [facility] in regards to blood in his urine.. they did a  ct scan , neurologist never came to see pt.   Hi Urine bag has cleared up.    Does he need to continue with the test from [facility]? To see if he has blood in his urine? He would like for someone to call him to advise. >> Apr 30, 2024  1:22 PM Zebedee SAUNDERS wrote: Pt called again would like a call back to [phone] [facility] Phone) , mobile: [phone] , if pt needs to continue with the test from [facility]?

## 2024-04-30 NOTE — Transitions of Care (Post Inpatient/ED Visit) (Signed)
 "  04/30/2024  Name: Jerry Mcpherson MRN: [id number] DOB: 1940-10-26  Today's TOC FU Call Status: Today's TOC FU Call Status:: Successful TOC FU Call Completed TOC FU Call Complete Date: 04/30/24  Patient's Name and Date of Birth confirmed. Name, DOB  Transition Care Management Follow-up Telephone Call Date of Discharge: 04/28/24 Discharge Facility: Other ([facility]) Name of [facility] ([facility]) [facility]charge Facility: [facility] Type of Discharge: Inpatient Admission Primary Inpatient Discharge Diagnosis:: Gross Hematuria How have you been since you were released from the hospital?: Better Patient Questions/Concerns:: My concern is that [facility] is supposed to call me tomorrow about an appointment with the kidney doctor follow up  Items Reviewed: Did you receive and understand the discharge instructions provided?: Yes Medications obtained,verified, and reconciled?: Yes (Medications Reviewed) Any new allergies since your discharge?: No Dietary orders reviewed?: Yes Type of Diet Ordered:: Heart Healthy Do you have support at home?: Yes People in Home [RPT]: other relative(s) Name of Support/Comfort Primary Source: Nephew is next door and I don't bother him but if I needed him there's no problem; I have a friend that lives in [place] area  Medications Reviewed Today: Medications Reviewed Today     Reviewed by Eilleen Richerd GRADE, RN (Registered Nurse) on 04/30/24 at 1447  Med List Status: <None>   Medication Order Taking? Sig Documenting Provider Last Dose Status Informant  amiodarone  (PACERONE ) 200 MG tablet [id number] Yes Take 2 tablets by mouth 2 times daily for 3 days, THEN 1 tablet 2 times daily for 7 days, THEN 1 tablet daily. Dorinda Drue DASEN, MD  Active Self  apixaban  (ELIQUIS ) 2.5 MG TABS tablet [id number]  Take 1 tablet (2.5 mg total) by mouth 2 (two) times daily.  Patient not taking: Reported on 04/30/2024   Alexander, Natalie, DO  Active Self  aspirin  EC 81 MG  tablet [id number]  Take 81 mg by mouth daily. Swallow whole.  Patient not taking: Reported on 04/30/2024   [provider]  Active Self  atorvastatin  (LIPITOR ) 80 MG tablet [id number] Yes Take 1 tablet (80 mg total) by mouth daily. Dorinda Drue DASEN, MD  Active Self  carvedilol  (COREG ) 3.125 MG tablet [id number] Yes Take 1 tablet (3.125 mg total) by mouth 2 (two) times daily with a meal. Caleen Qualia, MD  Active   ciprofloxacin  (CIPRO ) 500 MG tablet [id number] Yes Take 1 tablet (500 mg total) by mouth daily with breakfast for 10 days. Caleen Qualia, MD  Active   clopidogrel  (PLAVIX ) 75 MG tablet [id number]  Take 75 mg by mouth daily.  Patient not taking: Reported on 04/30/2024   [provider]  Active Self  ferrous sulfate  325 (65 FE) MG tablet [id number] Yes Take 325 mg by mouth daily. [provider]  Active Self           Med Note LESLY, RICHERD GRADE [place] Apr 30, 2024  2:45 PM) Waiting 1 hour after medications taken  furosemide  (LASIX ) 40 MG tablet [id number] Yes Take 1 tablet (40 mg total) by mouth daily as needed for fluid or edema.  Patient taking differently: Take 20 mg by mouth as needed for fluid or edema.   Amin, Sumayya, MD  Active   hydroxyurea  (HYDREA ) 500 MG capsule [id number] Yes Take 500 mg by mouth daily. [provider]  Active Self           Med Note GROVER, BURNARD GORMAN [place] Apr 18, 2024  3:35 PM)    levothyroxine  (SYNTHROID ) 75  MCG tablet [id number] Yes Take 1 tablet (75 mcg total) by mouth daily. Amin, Sumayya, MD  Active   Multiple Vitamins-Minerals (MULTIVITAMIN ADULT PO) [id number] Yes Take 1 tablet by mouth daily. [provider]  Active Self           Med Note MARVIS LELA SAILOR   Fri Feb 02, 2019 10:59 AM)    polyethylene glycol powder (GLYCOLAX /MIRALAX ) 17 GM/SCOOP powder [id number] Yes Take 17 g by mouth daily. Dissolve 1 capful (17g) in 4-8 ounces of liquid and take by mouth daily. Dorinda Drue DASEN, MD  Active Self  sodium  bicarbonate 650 MG tablet [id number] Yes Take 2 tablets (1,300 mg total) by mouth 2 (two) times daily. Dorinda Drue DASEN, MD  Active Self           Med Note GROVER, BURNARD GORMAN [place] Apr 18, 2024  3:42 PM)    Med List Note Lesly, Richerd GRADE, [place] 04/02/24 1135): 04/02/24 Patient states, I have told everyone, I don't know the names of my medicines and as long as I have what the doctors says is needed, I'm fine with that. 12/29/23 Patient states, I am taking everything that's on that paper except what I told you, we went through this last week nothing has changed            Home Care and Equipment/Supplies: Were Home Health Services Ordered?: Yes Name of Home Health Agency:: [facility] Has Agency set up a time to come to your home?: No EMR reviewed for Home Health Orders: Orders present/patient has not received call (refer to CM for follow-up) (They do my dressing to my feet twice a week. I need to call them to let them know I'm out) Any new equipment or medical supplies ordered?: No  Functional Questionnaire: Do you need assistance with bathing/showering or dressing?: No Do you need assistance with meal preparation?: No Do you need assistance with eating?: No Do you have difficulty maintaining continence: No Do you need assistance with getting out of bed/getting out of a chair/moving?: No Do you have difficulty managing or taking your medications?: No  Follow up appointments reviewed: PCP Follow-up appointment confirmed?: Yes Date of PCP follow-up appointment?: 05/11/24 Follow-up Provider: Dr. Nancyann Perry Specialist [facility] Follow-up appointment confirmed?: Yes Date of Specialist follow-up appointment?: 05/03/24 Follow-Up Specialty Provider[facility] Do you need transportation to your follow-up appointment?: No Do you understand care options if your condition(s) worsen?: Yes-patient verbalized understanding  SDOH Interventions Today    Flowsheet Row Most Recent Value   SDOH Interventions   Food Insecurity Interventions --  [I've got what I need, no issues]  Housing Interventions Intervention Not Indicated  Transportation Interventions Intervention Not Indicated  [I have my car and everything's fine now]  Utilities Interventions Intervention Not Indicated    Goals Addressed             This Visit's Progress    [facility] Transitions of Care (TOC) Care Plan       Problems:  Recent Hospitalization for treatment of Hematuria with HX bladder cancer s/p radical cystoprostatectomy with ileal conduit (2022) Follow up with [facility] providers for post hospital follow up Knowledge Deficit Related to Medication changes and follow up with providers with anticoagulants on hold  Goal:  Over the next 30 days, the patient will not experience hospital readmission  Interventions:  Transitions of Care: Doctor Visits  - discussed the importance of doctor visits  Anemia/Bleeding Interventions:  Assessment of understanding of  anemia/bleeding disorder diagnosis  Basic overview and discussion of anemia/bleeding disorder or acute disease state  Medications reviewed  Counseled on bleeding risk associated with recent bleeding from ileo conduit monitoring and importance of self-monitoring for signs/symptoms of bleeding Counseled on importance of regular laboratory monitoring as directed by provider Provided education about signs and symptoms of active bleeding such as stomach discomfort, coughing up blood or blood tinged secretions, bleeding from the gums/teeth, nosebleeds, increased bruising, blood in the urine/stool and/or if a traumatic injury occurs, regardless of severity of injury  encouraged strategies to prevent falls related to fatigue, weakness and dizziness; encouraged sitting before standing and using an assistive device encouraged optimal oral intake to support fluid balance and nutrition Screening for signs and symptoms of depression related to chronic disease  state Assessed social determinant of health barriers  Lab Results  Component Value Date   WBC 20.3 (HH) 04/24/2024   HGB 14.1 04/24/2024   HCT 45.8 04/24/2024   MCV 87 04/24/2024   PLT 624 (H) 04/24/2024    Patient Self Care Activities:  Attend all scheduled provider appointments Call pharmacy for medication refills 3-7 days in advance of running out of medications Call provider office for new concerns or questions  Notify RN Care Manager of TOC call rescheduling needs Participate in Transition of Care Program/Attend TOC scheduled calls Take medications as prescribed    Plan:  The patient has been provided with contact information for the care management team and has been advised to call with any health related questions or concerns.  Discussed and offered 30 day TOC program.  Patient agrees to weekly calls in program.  The patient has been provided with contact information for the care management team and has been advised to call with any health -related questions or concerns.  The patient verbalized understanding with current plan of care.          Richerd Fish, RN, BSN, CCM [facility], [facility] Management Coordinator Direct Dial: [phone]        "

## 2024-04-30 NOTE — Telephone Encounter (Unsigned)
 Copied from CRM [id number]. Topic: Clinical - Medication Refill >> Apr 30, 2024  9:52 AM Joesph NOVAK wrote: Medication: Oxycodone  -   Has the patient contacted their pharmacy? Yes (Agent: If no, request that the patient contact the pharmacy for the refill. If patient does not wish to contact the pharmacy document the reason why and proceed with request.) (Agent: If yes, when and what did the pharmacy advise?)  This is the patient's preferred pharmacy:    [facility] [place], [place] - [address] [place] [address] Phone: [phone] Fax: [phone]  Is this the correct pharmacy for this prescription? Yes If no, delete pharmacy and type the correct one.   Has the prescription been filled recently? Yes  Is the patient out of the medication? Yes  Has the patient been seen for an appointment in the last year OR does the patient have an upcoming appointment? Yes  Can we respond through MyChart? Yes  Agent: Please be advised that Rx refills may take up to 3 business days. We ask that you follow-up with your pharmacy.

## 2024-04-30 NOTE — Telephone Encounter (Signed)
 Copied from CRM [id number]. Topic: General - Other >> Apr 30, 2024  9:48 AM Joesph NOVAK wrote: Reason for CRM: patient states he went to [facility] in regards to blood in his urine.. they did a  ct scan , neurologist never came to see pt.   Hi Urine bag has cleared up.    Does he need to [facility] the test [facility]? To see if he has blood in his urine? He would like for someone to call him to advise.

## 2024-04-30 NOTE — Patient Instructions (Addendum)
 Visit Information  Thank you for taking time to visit with me today. Please don't hesitate to contact me if I can be of assistance to you before our next scheduled telephone appointment.  Our next appointment is by telephone on 05/08/24 at 1 PM  Following is a copy of your care plan:   Goals Addressed             This Visit's Progress    VBCI Transitions of Care (TOC) Care Plan       Problems:  Recent Hospitalization for treatment of Hematuria with HX bladder cancer s/p radical cystoprostatectomy with ileal conduit (2022) Follow up with [facility] providers for post hospital follow up Knowledge Deficit Related to Medication changes and follow up with providers with anticoagulants on hold  Goal:  Over the next 30 days, the patient will not experience hospital readmission  Interventions:  Transitions of Care: Doctor Visits  - discussed the importance of doctor visits  Anemia/Bleeding Interventions:  Assessment of understanding of anemia/bleeding disorder diagnosis  Basic overview and discussion of anemia/bleeding disorder or acute disease state  Medications reviewed  Counseled on bleeding risk associated with recent bleeding from ileo conduit monitoring and importance of self-monitoring for signs/symptoms of bleeding Counseled on importance of regular laboratory monitoring as directed by provider Provided education about signs and symptoms of active bleeding such as stomach discomfort, coughing up blood or blood tinged secretions, bleeding from the gums/teeth, nosebleeds, increased bruising, blood in the urine/stool and/or if a traumatic injury occurs, regardless of severity of injury  encouraged strategies to prevent falls related to fatigue, weakness and dizziness; encouraged sitting before standing and using an assistive device encouraged optimal oral intake to support fluid balance and nutrition Screening for signs and symptoms of depression related to chronic disease state Assessed  social determinant of health barriers  Lab Results  Component Value Date   WBC 20.3 (HH) 04/24/2024   HGB 14.1 04/24/2024   HCT 45.8 04/24/2024   MCV 87 04/24/2024   PLT 624 (H) 04/24/2024    Patient Self Care Activities:  Attend all scheduled provider appointments Call pharmacy for medication refills 3-7 days in advance of running out of medications Call provider office for new concerns or questions  Notify RN Care Manager of TOC call rescheduling needs Participate in Transition of Care Program/Attend TOC scheduled calls Take medications as prescribed    Plan:  The patient has been provided with contact information for the care management team and has been advised to call with any health related questions or concerns.  Discussed and offered 30 day TOC program.  Patient agrees to weekly calls in program.  The patient has been provided with contact information for the care management team and has been advised to call with any health -related questions or concerns.  The patient verbalized understanding with current plan of care.          The patient verbalized understanding of instructions, educational materials, and care plan provided today and DECLINED offer to receive copy of patient instructions, educational materials, and care plan.   The patient has been provided with contact information for the care management team and has been advised to call with any health related questions or concerns.  The patient will call [facility]* as advised to that they would call him tomorrow per patient and DC notes.   Please call the care guide team at [phone] if you need to cancel or reschedule your appointment.   Please call the Suicide  and Crisis Lifeline: 988 call the [place]  National Suicide Prevention Lifeline: 972-719-8453 or TTY: (313) 103-7266 TTY 516-500-2456) to talk to a trained counselor call 1-800-273-TALK (toll free, 24 hour hotline) call 911 if you are experiencing a Mental  Health or Behavioral Health Crisis or need someone to talk to.  Richerd Fish, RN, BSN, CCM [facility] Management Coordinator Direct Dial: [phone]

## 2024-05-01 MED ORDER — OXYCODONE-ACETAMINOPHEN 10-325 MG PO TABS: 1.0000 | ORAL_TABLET | Freq: Four times a day (QID) | ORAL | 0 refills | Status: AC | PRN

## 2024-05-01 NOTE — Telephone Encounter (Signed)
 Yes, it looks like there some type of mass in one of his kidneys. He needs to contact the urologist at [facility] for follow up.

## 2024-05-02 ENCOUNTER — Telehealth: Payer: Self-pay | Admitting: Family

## 2024-05-02 NOTE — Progress Notes (Unsigned)
 "  Advanced Heart Failure Clinic Note   Referring Physician: 01/26 admission PCP: Jerry Nancyann BRAVO, MD Cardiologist: None   Chief Complaint: shortness of breath   HPI:  Jerry Mcpherson is a 84 y.o. male with a history of HFrEF, hypertension, atrial flutter/ fibrillation, DVT, CKD, vertigo, hypothyroidism, bladder cancer (11/21) status post cystectomy and chronic urostomy/ chemo, PVD/ PAD, CAD, prior ischemic and hemorrhagic CVA, polycythemia vera.   Echo 02/03/24: EF 30-35%, mild LVH, G1DD, normal RV, mild/ moderate MR  Admitted 03/23/24 for elective R/ LHC: Severe left main coronary artery disease with 70% proximal stenosis. Otherwise, mild-moderate coronary artery disease. Normal left and right heart filling pressures. Moderately reduced Fick cardiac output/index (CO 3.1 L/min, CI 1.8 L/min/m). Moderate aortic valve stenosis (peak-to-peak gradient 22 mmHg). Not felt to be a good candidate for PCI or CABG in the setting of advanced age and comorbidities  He became hypotensive afterwards & briefly required pressors. Loaded with IV amiodarone  for AF/flutter & then transitioned to oral amio. No betablockers due to orthostasis. Lasix  held due to worsening renal function. Vascular consulted and angiography performed December 15 showing patent SFA stent, severe disease below the knee on left with no significant reconstitution of occluded tibial vessels   Admitted 04/18/24 with worsening shortness of breath, orthopnea and lower extremity edema due to HF exacerbation. IV diuresed. Recently had hematuria and had MRI done by primary care provider. In ER, labs with leukocytosis at 17, 300 which is around his typical range. Creatinine at 2.14 which is down from 2.9 on the day of prior to discharge. proBNP elevated at 25,472 and mildly elevated troponin at 37. CXR negative for any acute cardiopulmonary disease. Urology consulted. Palliative is following. Antibiotics given for possible pylonephritis.    Admitted 04/25/24 with ongoing hematuria. CT showed mass like to R middle renal pole concerning for malignancy as well as retroperitoneal lymphadenopathy suspicious for metastatic disease. To have kidney biopsy scheduled outpatient  He presents today for his initial HF visit with a chief complaint of minimal shortness of breath. Has associated fatigue, frequent dizziness, left toe pain. Appetite good. Interrrupted sleeping due to emptying catheter bag. Currently has all anticoagulants on hold until his kidney biopsy is completed next week. Says that he hasn't had any blood in his urine since they were stopped & he has concerns about resuming them. Denies chest pain, palpitations, abdominal distention, pedal edema. Wearing a boot on his left foot due to chronic wound on foot. Currently taking lasix  ~ twice / week.   Has scales at home but doesn't weigh daily. Denies any tobacco, alcohol or drug use.   Patient is extremely HOH.    Review of Systems: [y] = yes, [ ]  = no   General: Weight gain [ ] ; Weight loss [ ] ; Anorexia [ ] ; Fatigue davis.dad ]; Fever [ ] ; Chills [ ] ; Weakness [ ]   Cardiac: Chest pain/pressure [ ] ; Resting SOB [ ] ; Exertional SOB davis.dad ]; Orthopnea [ ] ; Pedal Edema [ ] ; Palpitations [ ] ; Syncope [ ] ; Presyncope [ ] ; Paroxysmal nocturnal dyspnea[ ]   Pulmonary: Cough [ ] ; Wheezing[ ] ; Hemoptysis[ ] ; Sputum [ ] ; Snoring [ ]   GI: Vomiting[ ] ; Dysphagia[ ] ; Melena[ ] ; Hematochezia [ ] ; Heartburn[ ] ; Abdominal pain [ ] ; Constipation [ ] ; Diarrhea [ ] ; BRBPR [ ]   GU: Hematuria[ ] ; Dysuria [ ] ; Nocturia[ ]   Vascular: Pain in legs with walking [ ] ; Pain in feet with lying flat [ ] ; Non-healing sores davis.dad ]; Stroke [  y]; TIA [ ] ; Slurred speech [ ] ;  Neuro: Dizziness[y ]; Vertigo[ ] ; Seizures[ ] ; Paresthesias[ ] ;Blurred vision [ ] ; Diplopia [ ] ; Vision changes [ ]   Ortho/Skin: Arthritis [ ] ; Joint pain [ ] ; Muscle pain [ ] ; Joint swelling [ ] ; Back Pain [ ] ; Rash [ ]   Psych: Depression[ ] ; Anxiety[  ]  Heme: Bleeding problems [ ] ; Clotting disorders [ ] ; Anemia [ ]   Endocrine: Diabetes [ ] ; Thyroid  dysfunction[y ]   Past Medical History:  Diagnosis Date   Acute deep vein thrombosis (DVT) of right femoral vein (HCC) 10/28/2020   US  10/24/2020 [facility] Acute right lower extremity DVT extending from the common femoral vein to  the popliteal vein.     Bladder cancer (HCC)    Genital herpes    Hyperglycemia    Hypertension    Squamous cell carcinoma     Current Outpatient Medications  Medication Sig Dispense Refill   amiodarone  (PACERONE ) 200 MG tablet Take 2 tablets by mouth 2 times daily for 3 days, THEN 1 tablet 2 times daily for 7 days, THEN 1 tablet daily. 56 tablet 0   apixaban  (ELIQUIS ) 2.5 MG TABS tablet Take 1 tablet (2.5 mg total) by mouth 2 (two) times daily. (Patient not taking: Reported on 04/30/2024) 60 tablet 0   aspirin  EC 81 MG tablet Take 81 mg by mouth daily. Swallow whole. (Patient not taking: Reported on 04/30/2024)     atorvastatin  (LIPITOR ) 80 MG tablet Take 1 tablet (80 mg total) by mouth daily. 30 tablet 3   carvedilol  (COREG ) 3.125 MG tablet Take 1 tablet (3.125 mg total) by mouth 2 (two) times daily with a meal. 60 tablet 1   ciprofloxacin  (CIPRO ) 500 MG tablet Take 1 tablet (500 mg total) by mouth daily with breakfast for 10 days. 10 tablet 0   clopidogrel  (PLAVIX ) 75 MG tablet Take 75 mg by mouth daily. (Patient not taking: Reported on 04/30/2024)     ferrous sulfate  325 (65 FE) MG tablet Take 325 mg by mouth daily.     furosemide  (LASIX ) 40 MG tablet Take 1 tablet (40 mg total) by mouth daily as needed for fluid or edema. (Patient taking differently: Take 20 mg by mouth as needed for fluid or edema.) 30 tablet 11   hydroxyurea  (HYDREA ) 500 MG capsule Take 500 mg by mouth daily.     levothyroxine  (SYNTHROID ) 75 MCG tablet Take 1 tablet (75 mcg total) by mouth daily. 30 tablet 1   Multiple Vitamins-Minerals (MULTIVITAMIN ADULT PO) Take 1 tablet by mouth daily.      oxyCODONE -acetaminophen  (PERCOCET) 10-325 MG tablet Take 1 tablet by mouth every 6 (six) hours as needed. for pain 120 tablet 0   polyethylene glycol powder (GLYCOLAX /MIRALAX ) 17 GM/SCOOP powder Take 17 g by mouth daily. Dissolve 1 capful (17g) in 4-8 ounces of liquid and take by mouth daily. 238 g 0   sodium bicarbonate  650 MG tablet Take 2 tablets (1,300 mg total) by mouth 2 (two) times daily. 30 tablet 1   No current facility-administered medications for this visit.    Allergies[1]    Social History   Socioeconomic History   Marital status: Divorced    Spouse name: Not on file   Number of children: 1   Years of education: Not on file   Highest education level: Some college, no degree  Occupational History   Occupation: Retired   Occupation: sales executive part time  Tobacco Use   Smoking status: Former  Current packs/day: 0.00    Average packs/day: 3.0 packs/day for 15.0 years (45.0 ttl pk-yrs)    Types: Cigarettes    Start date: 04/12/1953    Quit date: 04/12/1968    Years since quitting: 56.0   Smokeless tobacco: Never  Vaping Use   Vaping status: Never Used  Substance and Sexual Activity   Alcohol use: No    Alcohol/week: 0.0 standard drinks of alcohol   Drug use: No   Sexual activity: Not on file  Other Topics Concern   Not on file  Social History Narrative   Not on file   Social Drivers of Health   Tobacco Use: Medium Risk (04/24/2024)   Patient History    Smoking Tobacco Use: Former    Smokeless Tobacco Use: Never    Passive Exposure: Not on file  Financial Resource Strain: Low Risk  (04/26/2024)   Received from [facility]   Overall Financial Resource Strain (CARDIA)    Difficulty of Paying Living Expenses: Not very hard  Food Insecurity: Food Insecurity Present (04/30/2024)   Epic    Worried About Programme Researcher, Broadcasting/film/video in the Last Year: Sometimes true    Ran Out of Food in the Last Year: Sometimes true  Transportation Needs: Unmet  Transportation Needs (04/30/2024)   Epic    Lack of Transportation (Medical): Yes    Lack of Transportation (Non-Medical): Yes  Physical Activity: Inactive (08/10/2023)   Exercise Vital Sign    Days of Exercise per Week: 0 days    Minutes of Exercise per Session: 0 min  Stress: No Stress Concern Present (08/10/2023)   [employer] of Occupational Health - Occupational Stress Questionnaire    Feeling of Stress : Only a little  Social Connections: Socially Isolated (04/21/2024)   Social Connection and Isolation Panel    Frequency of Communication with Friends and Family: More than three times a week    Frequency of Social Gatherings with Friends and Family: More than three times a week    Attends Religious Services: Never    Database Administrator or Organizations: No    Attends Banker Meetings: Never    Marital Status: Divorced  Catering Manager Violence: Patient Declined (04/30/2024)   Epic    Fear of Current or Ex-Partner: Patient declined    Emotionally Abused: Patient declined    Physically Abused: Patient declined    Sexually Abused: Patient declined  Depression (PHQ2-9): Low Risk (04/30/2024)   Depression (PHQ2-9)    PHQ-2 Score: 0  Recent Concern: Depression (PHQ2-9) - Medium Risk (04/24/2024)   Depression (PHQ2-9)    PHQ-2 Score: 6  Alcohol Screen: Low Risk (08/10/2023)   Alcohol Screen    Last Alcohol Screening Score (AUDIT): 0  Housing: Unknown (04/30/2024)   Epic    Unable to Pay for Housing in the Last Year: No    Number of Times Moved in the Last Year: Not on file    Homeless in the Last Year: No  Utilities: Not At Risk (04/30/2024)   Epic    Threatened with loss of utilities: No  Health Literacy: Adequate Health Literacy (08/10/2023)   B1300 Health Literacy    Frequency of need for help with medical instructions: Never      Family History  Problem Relation Age of Onset   Hypertension Mother    Heart attack Mother    Stroke Father    Cancer  Father    COPD Sister    Congestive  Heart Failure Brother    Vitals:   05/03/24 1328  BP: 113/72  Pulse: 79  SpO2: 97%  Weight: 143 lb (64.9 kg)   Wt Readings from Last 3 Encounters:  05/03/24 143 lb (64.9 kg)  04/30/24 144 lb (65.3 kg)  04/21/24 138 lb 0.1 oz (62.6 kg)   Lab Results  Component Value Date   CREATININE 2.94 (H) 04/24/2024   CREATININE 2.50 (H) 04/21/2024   CREATININE 2.65 (H) 04/20/2024    PHYSICAL EXAM:  General: Well appearing. Extremely HOH Cor: No JVD. Regular rhythm, rate. 3/6 systolic murmur Lungs: clear Abdomen: soft, nontender, nondistended. Extremities: no edema. Boot on left foot Neuro:. Affect pleasant   ECG 04/18/24: NSR with 1st degree AV block   ASSESSMENT & PLAN:  1:  ischemic HFrEF- - R/ LHC 03/23/24: Severe left main coronary artery disease with 70% proximal stenosis. Otherwise, mild-moderate coronary artery disease. Normal left and right heart filling pressures. Moderately reduced Fick cardiac output/index (CO 3.1 L/min, CI 1.8 L/min/m). Moderate aortic valve stenosis (peak-to-peak gradient 22 mmHg) - NYHA class II - euvolemic - instructed to weigh daily and parameters to call about discussed - Echo 02/03/24: EF 30-35%, mild LVH, G1DD, normal RV, mild/ moderate MR - continue carvedilol  3.125mg  BID. May not tolerate titration due to issues with hypotension in the past - continue lasix  40mg  PRN. He says that he takes it ~ twice a week - angioedema with ARB - With CKD/ hypotension, will not be able to use MRA.  - proBNP 04/27/24 was 13,103 down from previous reading of 25,472.0 - proBNP today  2: HTN- - BP 113/72 - saw PCP Jeralyn) 12/25 - BMET 04/28/24 reviewed: sodium 138, potassium 4.2, creatinine 2.5, GFR 25 - BMET today  3: PAF- - INR 1.4 on 04/27/24 - saw cardiology (Agbor-Etang) 11/25 - continue amiodarone  200mg  daily - eliquis  currently on hold pending kidney biopsy next week  4: CAD- - R/ LHC 03/23/24: Severe left  main coronary artery disease with 70% proximal stenosis. Otherwise, mild-moderate coronary artery disease. Normal left and right heart filling pressures. Moderately reduced Fick cardiac output/index (CO 3.1 L/min, CI 1.8 L/min/m). Moderate aortic valve stenosis (peak-to-peak gradient 22 mmHg).  - not felt to be surgical candidate due to age and comorbidities - continue atorvastatin  80mg  daily - ASA and plavix  currently on hold pending kidney biopsy next week  5: PVD- - angiography performed December 15 showed patent SFA stent, severe disease below the knee on left with no significant reconstitution of occluded tibial vessels  - chronic left 3rd & 4th toe ulcers - wearing a boot on his left foot  6: Hematuria- - recent admission 01/26.  - CT 04/25/24 showed mass like to R middle renal pole concerning for malignancy as well as retroperitoneal lymphadenopathy suspicious for metastatic disease.  - Eliquis , ASA and plavix  currently being held until kidney biopsy obtained. He's concerned about bleeding returning once anticoags are resumed after biopsy. He may need to have discussion with cardiology regarding risk vs benefit - previous bladder cancer 11/21 - palliative was following during admission   Return in 1 month, sooner if needed.   I spent 50 minutes reviewing records, interviewing/ examing patient and managing plan/ orders.   Ellouise DELENA Class, FNP 05/02/24     [1]  Allergies Allergen Reactions   Losartan Potassium Swelling    angioedema Other reaction(s): Angioedema   Shellfish Allergy Swelling and Rash    Rash, itching, swelling   Currently eats SHELLFISH without symptoms  Shellfish Protein-Containing Drug Products Dermatitis and Swelling    Rash, itching, swelling    Rash, itching, swelling   Currently eats SHELLFISH without symptoms   Doxycycline  Other (See Comments)    Blisters on hand   Ultram  [Tramadol Hcl] Nausea And Vomiting   Celecoxib Itching, Swelling, Rash  and Other (See Comments)    Cannot take when eating shellfish   Codeine Palpitations    Heart racing   Cyclobenzaprine Other (See Comments)    Musculoskeletal therapy agents cause Excessive sedation Musculoskeletal therapy agents cause excessive sedation   Tramadol Hcl Nausea And Vomiting   "

## 2024-05-02 NOTE — Telephone Encounter (Signed)
 Patient has been advised and said he has appointment tomorrow at [facility] but does not think he can get a ride.  If he doesn't go tomorrow he said he can get an appointment next week

## 2024-05-02 NOTE — Telephone Encounter (Signed)
 Called to confirm/remind patient of their appointment at the [facility] on 05/03/24.   Appointment:   [x] Confirmed  [] Left mess   [] No answer/No voice mail  [] VM Full/unable to leave message  [] Phone not in service  Patient reminded to bring all medications and/or complete list.  Confirmed patient has transportation. Gave directions, instructed to utilize valet parking.

## 2024-05-03 ENCOUNTER — Ambulatory Visit: Admitting: Family

## 2024-05-03 ENCOUNTER — Ambulatory Visit: Attending: Family | Admitting: Family

## 2024-05-03 ENCOUNTER — Encounter: Payer: Self-pay | Admitting: Family

## 2024-05-03 VITALS — BP 113/72 | HR 79 | Wt 143.0 lb

## 2024-05-03 DIAGNOSIS — Z8551 Personal history of malignant neoplasm of bladder: Secondary | ICD-10-CM | POA: Insufficient documentation

## 2024-05-03 DIAGNOSIS — D45 Polycythemia vera: Secondary | ICD-10-CM | POA: Diagnosis not present

## 2024-05-03 DIAGNOSIS — I5020 Unspecified systolic (congestive) heart failure: Secondary | ICD-10-CM | POA: Insufficient documentation

## 2024-05-03 DIAGNOSIS — H9190 Unspecified hearing loss, unspecified ear: Secondary | ICD-10-CM | POA: Insufficient documentation

## 2024-05-03 DIAGNOSIS — L97529 Non-pressure chronic ulcer of other part of left foot with unspecified severity: Secondary | ICD-10-CM | POA: Insufficient documentation

## 2024-05-03 DIAGNOSIS — Z7901 Long term (current) use of anticoagulants: Secondary | ICD-10-CM | POA: Insufficient documentation

## 2024-05-03 DIAGNOSIS — E039 Hypothyroidism, unspecified: Secondary | ICD-10-CM | POA: Insufficient documentation

## 2024-05-03 DIAGNOSIS — I130 Hypertensive heart and chronic kidney disease with heart failure and stage 1 through stage 4 chronic kidney disease, or unspecified chronic kidney disease: Secondary | ICD-10-CM | POA: Insufficient documentation

## 2024-05-03 DIAGNOSIS — I10 Essential (primary) hypertension: Secondary | ICD-10-CM

## 2024-05-03 DIAGNOSIS — Z7982 Long term (current) use of aspirin: Secondary | ICD-10-CM | POA: Diagnosis not present

## 2024-05-03 DIAGNOSIS — Z906 Acquired absence of other parts of urinary tract: Secondary | ICD-10-CM | POA: Insufficient documentation

## 2024-05-03 DIAGNOSIS — R42 Dizziness and giddiness: Secondary | ICD-10-CM | POA: Insufficient documentation

## 2024-05-03 DIAGNOSIS — Z7902 Long term (current) use of antithrombotics/antiplatelets: Secondary | ICD-10-CM | POA: Diagnosis not present

## 2024-05-03 DIAGNOSIS — I82501 Chronic embolism and thrombosis of unspecified deep veins of right lower extremity: Secondary | ICD-10-CM | POA: Diagnosis not present

## 2024-05-03 DIAGNOSIS — Z87891 Personal history of nicotine dependence: Secondary | ICD-10-CM | POA: Insufficient documentation

## 2024-05-03 DIAGNOSIS — I2510 Atherosclerotic heart disease of native coronary artery without angina pectoris: Secondary | ICD-10-CM | POA: Diagnosis not present

## 2024-05-03 DIAGNOSIS — R310 Gross hematuria: Secondary | ICD-10-CM

## 2024-05-03 DIAGNOSIS — I480 Paroxysmal atrial fibrillation: Secondary | ICD-10-CM | POA: Diagnosis not present

## 2024-05-03 DIAGNOSIS — N2889 Other specified disorders of kidney and ureter: Secondary | ICD-10-CM | POA: Diagnosis not present

## 2024-05-03 DIAGNOSIS — Z936 Other artificial openings of urinary tract status: Secondary | ICD-10-CM | POA: Insufficient documentation

## 2024-05-03 DIAGNOSIS — R590 Localized enlarged lymph nodes: Secondary | ICD-10-CM | POA: Diagnosis not present

## 2024-05-03 DIAGNOSIS — I350 Nonrheumatic aortic (valve) stenosis: Secondary | ICD-10-CM | POA: Insufficient documentation

## 2024-05-03 DIAGNOSIS — R319 Hematuria, unspecified: Secondary | ICD-10-CM | POA: Insufficient documentation

## 2024-05-03 DIAGNOSIS — I739 Peripheral vascular disease, unspecified: Secondary | ICD-10-CM | POA: Insufficient documentation

## 2024-05-03 DIAGNOSIS — Z79899 Other long term (current) drug therapy: Secondary | ICD-10-CM | POA: Insufficient documentation

## 2024-05-03 DIAGNOSIS — Z5982 Transportation insecurity: Secondary | ICD-10-CM | POA: Insufficient documentation

## 2024-05-03 DIAGNOSIS — I25118 Atherosclerotic heart disease of native coronary artery with other forms of angina pectoris: Secondary | ICD-10-CM

## 2024-05-03 DIAGNOSIS — M79675 Pain in left toe(s): Secondary | ICD-10-CM | POA: Diagnosis not present

## 2024-05-03 DIAGNOSIS — N189 Chronic kidney disease, unspecified: Secondary | ICD-10-CM | POA: Insufficient documentation

## 2024-05-03 DIAGNOSIS — I959 Hypotension, unspecified: Secondary | ICD-10-CM | POA: Insufficient documentation

## 2024-05-03 DIAGNOSIS — Z9221 Personal history of antineoplastic chemotherapy: Secondary | ICD-10-CM | POA: Insufficient documentation

## 2024-05-03 DIAGNOSIS — Z95820 Peripheral vascular angioplasty status with implants and grafts: Secondary | ICD-10-CM | POA: Insufficient documentation

## 2024-05-03 DIAGNOSIS — I4892 Unspecified atrial flutter: Secondary | ICD-10-CM | POA: Insufficient documentation

## 2024-05-03 DIAGNOSIS — Z8673 Personal history of transient ischemic attack (TIA), and cerebral infarction without residual deficits: Secondary | ICD-10-CM | POA: Insufficient documentation

## 2024-05-03 NOTE — Patient Instructions (Addendum)
 It was nice to meet you today!  Begin weighing daily and call for an overnight weight gain of 3 pounds or more or a weekly weight gain of more than 5 pounds.

## 2024-05-04 ENCOUNTER — Ambulatory Visit: Payer: Self-pay | Admitting: Family

## 2024-05-04 LAB — BASIC METABOLIC PANEL WITH GFR
BUN/Creatinine Ratio: 19 (ref 10–24)
BUN: 43 mg/dL — ABNORMAL HIGH (ref 8–27)
CO2: 17 mmol/L — ABNORMAL LOW (ref 20–29)
Calcium: 9 mg/dL (ref 8.6–10.2)
Chloride: 109 mmol/L — ABNORMAL HIGH (ref 96–106)
Creatinine, Ser: 2.28 mg/dL — ABNORMAL HIGH (ref 0.76–1.27)
Glucose: 78 mg/dL (ref 70–99)
Potassium: 5.3 mmol/L — ABNORMAL HIGH (ref 3.5–5.2)
Sodium: 145 mmol/L — ABNORMAL HIGH (ref 134–144)
eGFR: 27 mL/min/1.73 — ABNORMAL LOW

## 2024-05-04 LAB — PRO B NATRIURETIC PEPTIDE: NT-Pro BNP: 16536 pg/mL — ABNORMAL HIGH (ref 0–486)

## 2024-05-04 NOTE — Telephone Encounter (Signed)
 That's fine

## 2024-05-07 ENCOUNTER — Ambulatory Visit: Admitting: Cardiology

## 2024-05-08 ENCOUNTER — Other Ambulatory Visit: Payer: Self-pay

## 2024-05-08 ENCOUNTER — Telehealth: Payer: Self-pay

## 2024-05-08 NOTE — Telephone Encounter (Signed)
 Advised

## 2024-05-08 NOTE — Transitions of Care (Post Inpatient/ED Visit) (Unsigned)
 " Transition of Care week 2  Visit Note  05/09/2024  Name: Jerry Mcpherson MRN: [id number]          DOB: 1940/05/13  Situation: Patient enrolled in [facility] 30-day program. Visit completed with patient by telephone.   Background:   Initial Transition Care Management Follow-up Telephone Call Discharge Date and Diagnosis: 04/28/24, Gross Hematuria   Past Medical History:  Diagnosis Date   Acute deep vein thrombosis (DVT) of right femoral vein (HCC) 10/28/2020   US  10/24/2020 [facility] Acute right lower extremity DVT extending from the common femoral vein to  the popliteal vein.     Bladder cancer (HCC)    Genital herpes    Hyperglycemia    Hypertension    Squamous cell carcinoma     Assessment: Patient Reported Symptoms: Cognitive Cognitive Status: Able to follow simple commands, Alert and oriented to person, place, and time, Normal speech and language skills      Neurological Neurological Review of Symptoms: No symptoms reported    HEENT HEENT Symptoms Reported: No symptoms reported      Cardiovascular Cardiovascular Symptoms Reported: No symptoms reported Weight: 143 lb (64.9 kg) Cardiovascular Self-Management Outcome: 4 (good) Cardiovascular Comment: it's the same  Respiratory Respiratory Symptoms Reported: No symptoms reported Respiratory Self-Management Outcome: 4 (good)  Endocrine Endocrine Symptoms Reported: No symptoms reported    Gastrointestinal Gastrointestinal Symptoms Reported: No symptoms reported Gastrointestinal Self-Management Outcome: 4 (good)    Genitourinary Genitourinary Symptoms Reported: No symptoms reported Additional Genitourinary Details: I've had no blood coming out since I've been off of these blood thinners, I know it's 3 of them I'm off of Genitourinary Comment: HX of bladder cancer s/p radical cystoprostatectomy with ileal conduit (2022)  Integumentary Integumentary Symptoms Reported: Wound Additional Integumentary Details: [employer] dressing  still twice a week Skin Self-Management Outcome: 3 (uncertain)  Musculoskeletal Musculoskelatal Symptoms Reviewed: No symptoms reported Additional Musculoskeletal Details: uses walker; right foot with dressing Musculoskeletal Self-Management Outcome: 3 (uncertain)      Psychosocial Psychosocial Symptoms Reported: No symptoms reported         Today's Vitals   05/08/24 1308  Weight: 143 lb (64.9 kg)      Medications Reviewed Today     Reviewed by Eilleen Richerd GRADE, RN (Registered Nurse) on 05/08/24 at 1332  Med List Status: <None>   Medication Order Taking? Sig Documenting Provider Last Dose Status Informant  amiodarone  (PACERONE ) 200 MG tablet [id number] Yes Take 2 tablets by mouth 2 times daily for 3 days, THEN 1 tablet 2 times daily for 7 days, THEN 1 tablet daily. Dorinda Drue DASEN, MD  Active Self           Med Note LESLY, RICHERD GRADE [place] May 08, 2024  1:15 PM) Schedule is taking one a day now  apixaban  (ELIQUIS ) 2.5 MG TABS tablet [id number]  Take 1 tablet (2.5 mg total) by mouth 2 (two) times daily.  Patient not taking: Reported on 05/08/2024   Alexander, Natalie, DO  Active Self  aspirin  EC 81 MG tablet [id number]  Take 81 mg by mouth daily. Swallow whole.  Patient not taking: Reported on 05/08/2024   [provider]  Active Self  atorvastatin  (LIPITOR ) 80 MG tablet [id number] Yes Take 1 tablet (80 mg total) by mouth daily. Dorinda Drue DASEN, MD  Active Self  carvedilol  (COREG ) 3.125 MG tablet [id number] Yes Take 1 tablet (3.125 mg total) by mouth 2 (two) times daily with a meal. Caleen Qualia, MD  Active  clopidogrel  (PLAVIX ) 75 MG tablet [id number]  Take 75 mg by mouth daily.  Patient not taking: Reported on 05/08/2024   [provider]  Active Self  ferrous sulfate  325 (65 FE) MG tablet [id number] Yes Take 325 mg by mouth daily. [provider]  Active Self           Med Note LESLY, RICHERD CINDERELLA [place] Apr 30, 2024  2:45 PM) Waiting 1 hour after  medications taken  furosemide  (LASIX ) 40 MG tablet [id number] Yes Take 1 tablet (40 mg total) by mouth daily as needed for fluid or edema. Amin, Sumayya, MD  Active   hydroxyurea  (HYDREA ) 500 MG capsule [id number] Yes Take 500 mg by mouth daily. [provider]  Active Self           Med Note GROVER, BURNARD RAMAN   Wed Apr 18, 2024  3:35 PM)    levothyroxine  (SYNTHROID ) 75 MCG tablet [id number] Yes Take 1 tablet (75 mcg total) by mouth daily. Amin, Sumayya, MD  Active   Multiple Vitamins-Minerals (MULTIVITAMIN ADULT PO) [id number] Yes Take 1 tablet by mouth daily. [provider]  Active Self           Med Note MARVIS LELA SAILOR   Fri Feb 02, 2019 10:59 AM)    oxyCODONE -acetaminophen  (PERCOCET) 10-325 MG tablet [id number] Yes Take 1 tablet by mouth every 6 (six) hours as needed. for pain [name], MD  Active   polyethylene glycol powder (GLYCOLAX /MIRALAX ) 17 GM/SCOOP powder [id number] Yes Take 17 g by mouth daily. Dissolve 1 capful (17g) in 4-8 ounces of liquid and take by mouth daily. Dorinda Drue DASEN, MD  Active Self  sodium bicarbonate  650 MG tablet [id number] Yes Take 2 tablets (1,300 mg total) by mouth 2 (two) times daily. Dorinda Drue DASEN, MD  Active Self           Med Note GROVER, BURNARD RAMAN [place] Apr 18, 2024  3:42 PM)    Med List Note Lesly, Richerd CINDERELLA, [place] 04/02/24 1135): 04/02/24 Patient states, I have told everyone, I don't know the names of my medicines and as long as I have what the doctors says is needed, I'm fine with that. 12/29/23 Patient states, I am taking everything that's on that paper except what I told you, we went through this last week nothing has changed            Recommendation:   Continue Current Plan of Care  Follow Up Plan:   Telephone follow-up in 1 week  Richerd Fish, RN, BSN, CCM [facility], [facility] RN Care Manager Direct Dial: [phone]           "

## 2024-05-08 NOTE — Patient Instructions (Signed)
 Visit Information  Thank you for taking time to visit with me today. Please don't hesitate to contact me if I can be of assistance to you before our next scheduled telephone appointment.  Our next appointment is (patient took a call and call attempt to reach him not currently successful)  Following is a copy of your care plan:   Goals Addressed             This Visit's Progress    VBCI Transitions of Care (TOC) Care Plan       Problems:  Recent Hospitalization for treatment of Hematuria with HX bladder cancer s/p radical cystoprostatectomy with ileal conduit (2022) Follow up with [facility] providers for post hospital follow up Knowledge Deficit Related to Medication changes and follow up with providers with anticoagulants on hold  Goal:  Over the next 30 days, the patient will not experience hospital readmission  Interventions:  Transitions of Care: Doctor Visits  - discussed the importance of doctor visits  Anemia/Bleeding Interventions:  Assessment of understanding of anemia/bleeding disorder diagnosis  Basic overview and discussion of anemia/bleeding disorder or acute disease state  Medications reviewed  Counseled on bleeding risk associated with recent bleeding from ileo conduit monitoring and importance of self-monitoring for signs/symptoms of bleeding Counseled on importance of regular laboratory monitoring as directed by provider Provided education about signs and symptoms of active bleeding such as stomach discomfort, coughing up blood or blood tinged secretions, bleeding from the gums/teeth, nosebleeds, increased bruising, blood in the urine/stool and/or if a traumatic injury occurs, regardless of severity of injury  encouraged strategies to prevent falls related to fatigue, weakness and dizziness; encouraged sitting before standing and using an assistive device encouraged optimal oral intake to support fluid balance and nutrition Screening for signs and symptoms of depression  related to chronic disease state Assessed social determinant of health barriers  05/08/24 Reviewed with patient regarding follow up with providers, awaiting on lab results and has upcoming appointment with urology on 05/10/24.  Patient denying chills fever, or s/s of UTI stating ileo-conduit functioning well without any blood.  Lab Results  Component Value Date   WBC 20.3 (HH) 04/24/2024   HGB 14.1 04/24/2024   HCT 45.8 04/24/2024   MCV 87 04/24/2024   PLT 624 (H) 04/24/2024    Patient Self Care Activities:  Attend all scheduled provider appointments Call pharmacy for medication refills 3-7 days in advance of running out of medications Call provider office for new concerns or questions  Notify RN Care Manager of TOC call rescheduling needs Participate in Transition of Care Program/Attend TOC scheduled calls Take medications as prescribed    Plan:  The patient has been provided with contact information for the care management team and has been advised to call with any health related questions or concerns.  Discussed and offered 30 day TOC program.  Patient agrees to weekly calls in program.  The patient has been provided with contact information for the care management team and has been advised to call with any health -related questions or concerns.  The patient verbalized understanding with current plan of care.          The patient verbalized understanding of instructions, educational materials, and care plan provided today and DECLINED offer to receive copy of patient instructions, educational materials, and care plan.   The patient has been provided with contact information for the care management team and has been advised to call with any health related questions or concerns.  Please call the care guide team at [phone] if you need to cancel or reschedule your appointment.   Please call the [place]  National Suicide Prevention Lifeline: 336-661-3163 or TTY: 651-286-9153 TTY  865-593-8288) to talk to a trained counselor if you are experiencing a Mental Health or Behavioral Health Crisis or need someone to talk to.  Richerd Fish, RN, BSN, CCM [facility], [facility] RN Care Manager Direct Dial: [phone]

## 2024-05-11 ENCOUNTER — Ambulatory Visit (INDEPENDENT_AMBULATORY_CARE_PROVIDER_SITE_OTHER): Admitting: Family Medicine

## 2024-05-11 ENCOUNTER — Ambulatory Visit: Admitting: Cardiology

## 2024-05-11 VITALS — BP 139/69 | HR 71 | Resp 16 | Ht 70.0 in | Wt 148.0 lb

## 2024-05-11 DIAGNOSIS — N2889 Other specified disorders of kidney and ureter: Secondary | ICD-10-CM | POA: Diagnosis not present

## 2024-05-11 DIAGNOSIS — N184 Chronic kidney disease, stage 4 (severe): Secondary | ICD-10-CM

## 2024-05-11 DIAGNOSIS — Z8603 Personal history of neoplasm of uncertain behavior: Secondary | ICD-10-CM

## 2024-05-11 DIAGNOSIS — I739 Peripheral vascular disease, unspecified: Secondary | ICD-10-CM

## 2024-05-11 DIAGNOSIS — Z8551 Personal history of malignant neoplasm of bladder: Secondary | ICD-10-CM | POA: Diagnosis not present

## 2024-05-11 DIAGNOSIS — I5022 Chronic systolic (congestive) heart failure: Secondary | ICD-10-CM

## 2024-05-11 DIAGNOSIS — D72829 Elevated white blood cell count, unspecified: Secondary | ICD-10-CM | POA: Diagnosis not present

## 2024-05-11 DIAGNOSIS — E039 Hypothyroidism, unspecified: Secondary | ICD-10-CM | POA: Diagnosis not present

## 2024-05-11 DIAGNOSIS — I132 Hypertensive heart and chronic kidney disease with heart failure and with stage 5 chronic kidney disease, or end stage renal disease: Secondary | ICD-10-CM

## 2024-05-11 DIAGNOSIS — I480 Paroxysmal atrial fibrillation: Secondary | ICD-10-CM

## 2024-05-11 MED ORDER — LEVOTHYROXINE SODIUM 75 MCG PO TABS: 75.0000 ug | ORAL_TABLET | Freq: Every day | ORAL | 1 refills | Status: DC

## 2024-05-11 MED ORDER — LEVOTHYROXINE SODIUM 75 MCG PO TABS: 75.0000 ug | ORAL_TABLET | Freq: Every day | ORAL | 1 refills | Status: AC

## 2024-05-11 NOTE — Patient Instructions (Signed)
 Jerry Mcpherson  Please review the attached list of medications and notify my office if there are any errors.   . Please bring all of your medications to every appointment so we can make sure that our medication list is the same as yours.

## 2024-05-11 NOTE — Progress Notes (Signed)
 "     Established patient visit   Patient: Jerry Mcpherson   DOB: 1940/05/10   84 y.o. Male  MRN: [id number] Visit Date: 05/11/2024  Today's healthcare provider: Nancyann Perry, MD   Chief Complaint  Patient presents with   Follow-up    6 wk f/u    Subjective    Discussed the use of AI scribe software for clinical note transcription with the patient, who gave verbal consent to proceed.  History of Present Illness   Jerry Mcpherson is an 84 year old male with a history of bladder cancer presents for follow-up after recent hospitalization and MRI findings related to hematuria.  He was admitted to [facility] for hematuria. An MRI of the abdomen revealed a 2x2x3 cm area in the right kidney, a 1.5x2.4 cm mixed soft tissue area in the left kidney, a multilobulated 4.5x5.8 cm cystic lesion in the left hepatic lobe, and an 11x12 mm right adrenal adenoma.  He was treated for pyelonephritis and discharged with a course of Bactrim , Cipro , and cefepime . An attempt to biopsy for the kidney lesion was made at [facility] yesterday, but he experienced significant pain and complications during the procedure, including hitting a nerve, during IV insertion which caused numbness in his legs, cramping, shivering, and a headache. Due to the pain, he decided not to proceed with the biopsy at that time.  He experiences shortness of breath, which he attributes to his heart condition. The shortness of breath has worsened over time, particularly when walking long distances, such as in a store. He is not as active as he used to be due to this condition.  He is currently taking levothyroxine  and has six pills left.  He had been out for several days prior to last blood draw, but is taking consistently.     Lab Results  Component Value Date   TSH 9.610 (H) 03/24/2024     Medications: Show/hide medication list[1] Review of Systems  Constitutional:  Negative for appetite change, chills and fever.   Respiratory:  Negative for chest tightness, shortness of breath and wheezing.   Cardiovascular:  Negative for chest pain and palpitations.  Gastrointestinal:  Negative for abdominal pain, nausea and vomiting.       Objective    BP 139/69 (BP Location: Left Arm, Patient Position: Sitting, Cuff Size: Normal)   Pulse 71   Resp 16   Ht 5' 10 (1.778 m)   Wt 148 lb (67.1 kg)   SpO2 97%   BMI 21.24 kg/m   Physical Exam   General: Appearance:    Well developed, well nourished male in no acute distress  Eyes:    PERRL, conjunctiva/corneas clear, EOM's intact       Lungs:     Clear to auscultation bilaterally, respirations unlabored  Heart:    Normal heart rate. Regularly irregular rhythm.  2/6 harsh, crescendo-decrescendo, systolic murmur at right upper sternal border   MS:   All extremities are intact.    Neurologic:   Awake, alert, oriented x 3. No apparent focal neurological defect.         Assessment & Plan        Hematuria with right renal mass MRI showed a 2x2x3 cm right kidney area, suspected pyelonephritis. Biopsy unsuccessful due to complications. Concern for malignancy due to bladder cancer history. - Reschedule biopsy of right renal mass. - Follow up with urology for further evaluation and management.  History of bladder cancer Potential recurrence or new  malignancy suspected due to right renal mass and hematuria. - Proceed with biopsy of right renal mass to rule out malignancy.  Heart disease with chronic shortness of breath Chronic shortness of breath persists, slightly worse. Current medications support heart function. Reduced activity due to symptoms. - Encouraged regular physical activity as tolerated. - Continue current heart medications.  Hypothyroidism Six pills of thyroid  medication remaining. Blood work deferred due to difficulty with blood draw. - Refilled thyroid  medication at current dose. - Plan to check thyroid  hormone levels in a couple of months.     Return in about 10 weeks (around 07/20/2024).     Nancyann Perry, MD  [facility] [phone] (phone) [phone] (fax)  [facility]    [1]  Outpatient Medications Prior to Visit  Medication Sig   amiodarone  (PACERONE ) 200 MG tablet Take 2 tablets by mouth 2 times daily for 3 days, THEN 1 tablet 2 times daily for 7 days, THEN 1 tablet daily.   apixaban  (ELIQUIS ) 2.5 MG TABS tablet Take 1 tablet (2.5 mg total) by mouth 2 (two) times daily. (Patient not taking: Reported on 05/11/2024)   aspirin  EC 81 MG tablet Take 81 mg by mouth daily. Swallow whole. (Patient not taking: Reported on 05/11/2024)   atorvastatin  (LIPITOR ) 80 MG tablet Take 1 tablet (80 mg total) by mouth daily.   carvedilol  (COREG ) 3.125 MG tablet Take 1 tablet (3.125 mg total) by mouth 2 (two) times daily with a meal.   clopidogrel  (PLAVIX ) 75 MG tablet Take 75 mg by mouth daily. (Patient not taking: Reported on 05/11/2024)   ferrous sulfate  325 (65 FE) MG tablet Take 325 mg by mouth daily.   furosemide  (LASIX ) 40 MG tablet Take 1 tablet (40 mg total) by mouth daily as needed for fluid or edema.   hydroxyurea  (HYDREA ) 500 MG capsule Take 500 mg by mouth daily.   Multiple Vitamins-Minerals (MULTIVITAMIN ADULT PO) Take 1 tablet by mouth daily.   oxyCODONE -acetaminophen  (PERCOCET) 10-325 MG tablet Take 1 tablet by mouth every 6 (six) hours as needed. for pain   polyethylene glycol powder (GLYCOLAX /MIRALAX ) 17 GM/SCOOP powder Take 17 g by mouth daily. Dissolve 1 capful (17g) in 4-8 ounces of liquid and take by mouth daily.   sodium bicarbonate  650 MG tablet Take 2 tablets (1,300 mg total) by mouth 2 (two) times daily.   [DISCONTINUED] levothyroxine  (SYNTHROID ) 75 MCG tablet Take 1 tablet (75 mcg total) by mouth daily.   No facility-administered medications prior to visit.   "

## 2024-05-14 ENCOUNTER — Other Ambulatory Visit: Payer: Self-pay

## 2024-05-16 ENCOUNTER — Ambulatory Visit: Admitting: Family

## 2024-05-17 ENCOUNTER — Other Ambulatory Visit: Payer: Self-pay | Admitting: Family Medicine

## 2024-05-17 DIAGNOSIS — E039 Hypothyroidism, unspecified: Secondary | ICD-10-CM

## 2024-05-17 NOTE — Telephone Encounter (Signed)
 [facility] called and spoke to [place], [facility] about the refill(s) levothyroxine  requested. Advised it was sent on 05/11/24 #90/1 refill(s). She says it's going through and will be ready around 1930 today.   Copied from CRM [id number]. Topic: Clinical - Medication Refill >> May 17, 2024  3:13 PM Amber H wrote: Medication: hydroxyurea  (HYDREA ) 500 MG capsule and levothyroxine  (SYNTHROID ) 75 MCG tablet **state his cancer doctor with [facility] is requesting that he stays on hydroxyurea  (HYDREA ) 500 MG.   Has the patient contacted their pharmacy? Yes (Agent: If no, request that the patient contact the pharmacy for the refill. If patient does not wish to contact the pharmacy document the reason why and proceed with request.) (Agent: If yes, when and what did the pharmacy advise?)  This is the patient's preferred pharmacy:  [phone] [address] [place] - [address] [place] [address] Phone: [phone] Fax: [phone]  Is this the correct pharmacy for this prescription? Yes If no, delete pharmacy and type the correct one.   Has the prescription been filled recently? No  Is the patient out of the medication? Yes  Has the patient been seen for an appointment in the last year OR does the patient have an upcoming appointment? Yes- 05/11/2024  Can we respond through MyChart? No  Agent: Please be advised that Rx refills may take up to 3 business days. We ask that you follow-up with your pharmacy.

## 2024-05-18 ENCOUNTER — Telehealth: Payer: Self-pay

## 2024-05-18 ENCOUNTER — Other Ambulatory Visit: Payer: Self-pay

## 2024-05-18 ENCOUNTER — Ambulatory Visit: Admitting: Podiatry

## 2024-05-18 ENCOUNTER — Encounter: Payer: Self-pay | Admitting: Podiatry

## 2024-05-18 ENCOUNTER — Other Ambulatory Visit: Payer: Self-pay | Admitting: *Deleted

## 2024-05-18 DIAGNOSIS — D45 Polycythemia vera: Secondary | ICD-10-CM

## 2024-05-18 MED ORDER — HYDROXYUREA 500 MG PO CAPS: 500.0000 mg | ORAL_CAPSULE | Freq: Every day | ORAL | 3 refills | Status: AC

## 2024-05-18 NOTE — Telephone Encounter (Incomplete)
 Caller verified using pt's full name and dob prior to discussing PHI   Patient needs hydrea  RF- script pended.

## 2024-05-18 NOTE — Telephone Encounter (Unsigned)
 Copied from CRM [id number]. Topic: Clinical - Prescription Issue >> May 18, 2024  1:34 PM Rosaria BRAVO wrote: Reason for CRM: Pt called reporting that [facility] [place] [facility] not [facility] him his Levothyroxine . They say its on hold and that his PCP needs to approve it. Pt is upset and wants to be contacted once this is resolved.

## 2024-05-18 NOTE — Telephone Encounter (Signed)
 Copied from CRM #[id number]. Topic: Clinical - Prescription Issue >> May 18, 2024 11:57 AM Jerry Mcpherson wrote: Reason for CRM: Pt calling states he went to pharmacy to pick up prescribed medication, and pharmacy would not release to pt as per pharmacy states medication was on hold.  Medication: levothyroxine  (SYNTHROID ) 75 MCG tablet   Pt calling to inquire why medications may be on hold.   Can be reached at [phone]  to advise further.  [facility]  [facility] [address], [place] - [address] [place] [address] Phone: [phone] Fax: [phone]

## 2024-05-21 ENCOUNTER — Inpatient Hospital Stay: Attending: Oncology

## 2024-05-21 ENCOUNTER — Inpatient Hospital Stay: Admitting: Oncology

## 2024-05-22 ENCOUNTER — Ambulatory Visit: Admitting: Student

## 2024-05-28 ENCOUNTER — Encounter: Admitting: Physician Assistant

## 2024-05-31 ENCOUNTER — Ambulatory Visit: Admitting: Family

## 2024-08-08 ENCOUNTER — Ambulatory Visit: Admitting: Family Medicine

## 2024-08-15 ENCOUNTER — Ambulatory Visit

## 2024-08-16 ENCOUNTER — Ambulatory Visit: Admitting: Podiatry
